# Patient Record
Sex: Male | Born: 1960 | Race: White | Hispanic: No | Marital: Married | Smoking: Current every day smoker
Health system: Southern US, Community
[De-identification: ages and names within clinical notes are randomized; demographics above are authoritative.]

## PROBLEM LIST (undated history)

## (undated) DIAGNOSIS — I10 Essential (primary) hypertension: Secondary | ICD-10-CM

## (undated) DIAGNOSIS — F32A Depression, unspecified: Secondary | ICD-10-CM

## (undated) DIAGNOSIS — M1990 Unspecified osteoarthritis, unspecified site: Secondary | ICD-10-CM

## (undated) DIAGNOSIS — I739 Peripheral vascular disease, unspecified: Secondary | ICD-10-CM

## (undated) DIAGNOSIS — R0600 Dyspnea, unspecified: Secondary | ICD-10-CM

## (undated) DIAGNOSIS — E785 Hyperlipidemia, unspecified: Secondary | ICD-10-CM

## (undated) DIAGNOSIS — E119 Type 2 diabetes mellitus without complications: Secondary | ICD-10-CM

## (undated) DIAGNOSIS — U071 COVID-19: Secondary | ICD-10-CM

## (undated) HISTORY — PX: CERVICAL FUSION: SHX112

---

## 2009-12-20 ENCOUNTER — Encounter: Admission: RE | Admit: 2009-12-20 | Discharge: 2009-12-20 | Payer: Self-pay | Admitting: Internal Medicine

## 2009-12-20 IMAGING — CT CT HEAD W/O CM
2 series · 16 of 30 positions shown, 20 images · non-contrast
Comparison: None.

CLINICAL DATA: Vision, query mass at the optic chiasm

CT HEAD WITHOUT CONTRAST
TECHNIQUE: Contiguous axial images were obtained from the base of
the skull through the vertex without contrast.

[Series 2: head wo · axial · 0.49mm/px · z∈[+192,+319]mm · 13 of 30 slices shown, 17 images]
[im 3/30  brain]
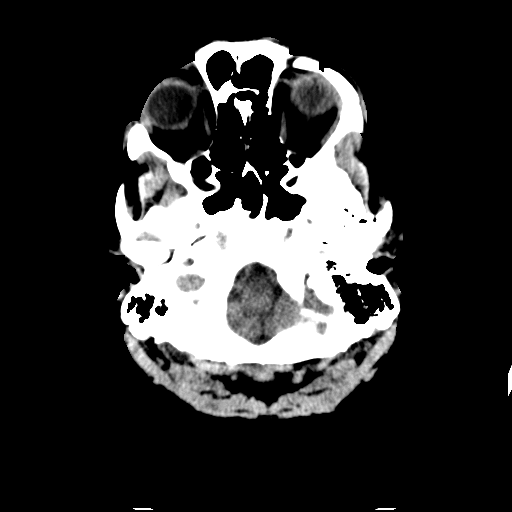
[im 3/30  bone]
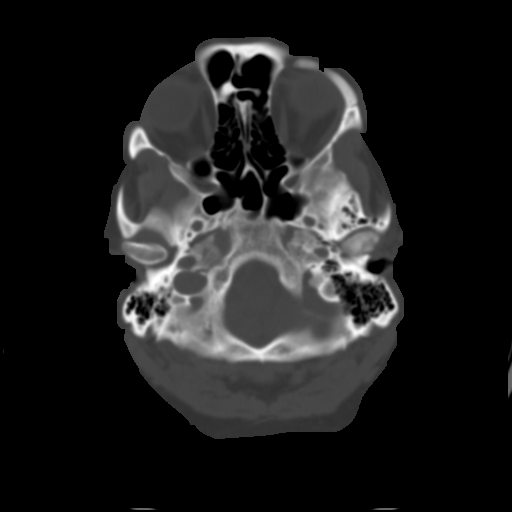
[im 5/30  brain]
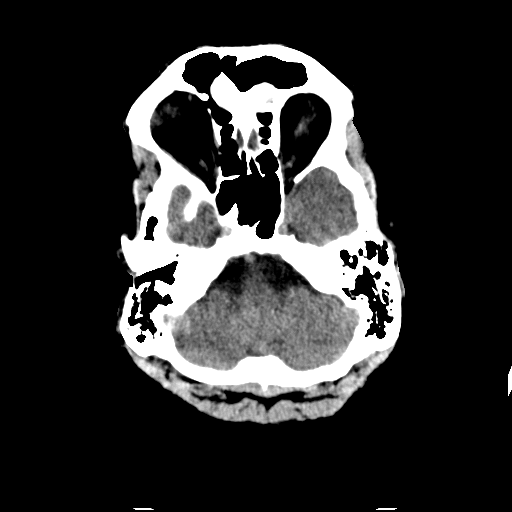
[im 7/30  brain]
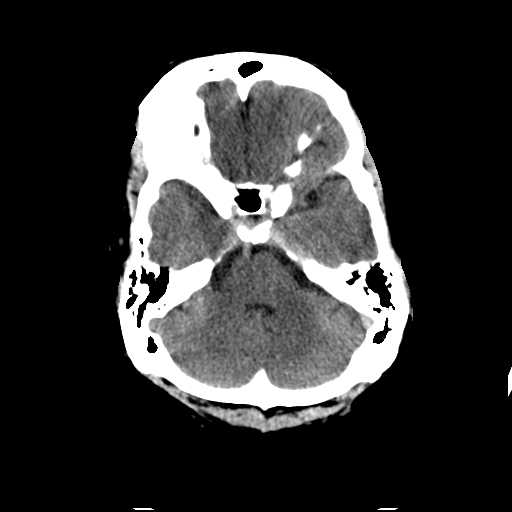
[im 9/30  brain]
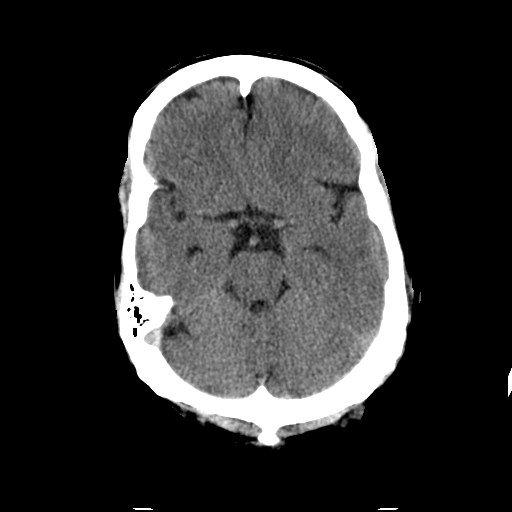
[im 11/30  brain]
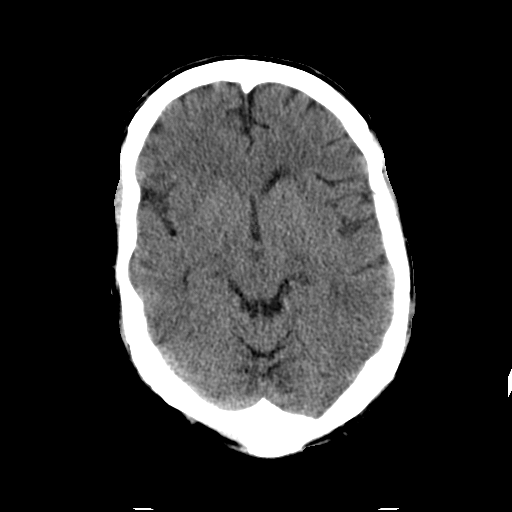
[im 11/30  bone]
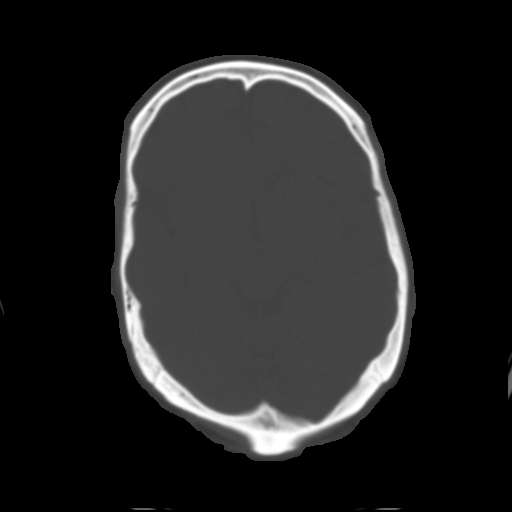
[im 13/30  brain]
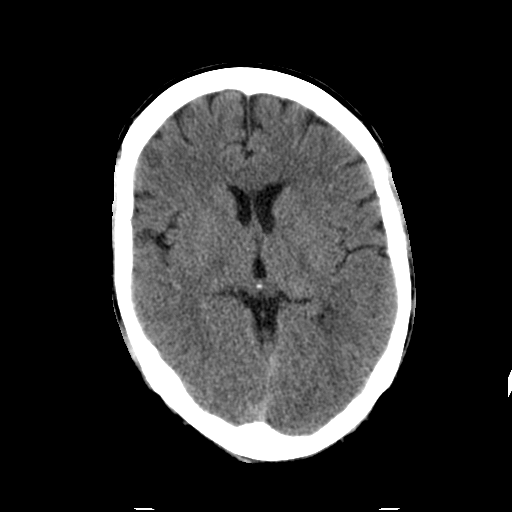
[im 15/30  brain]
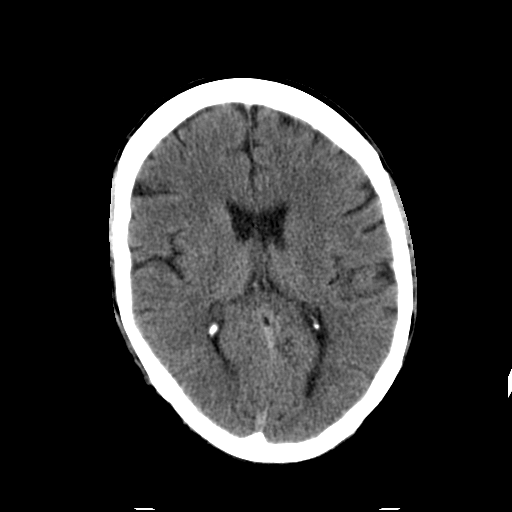
[im 17/30  brain]
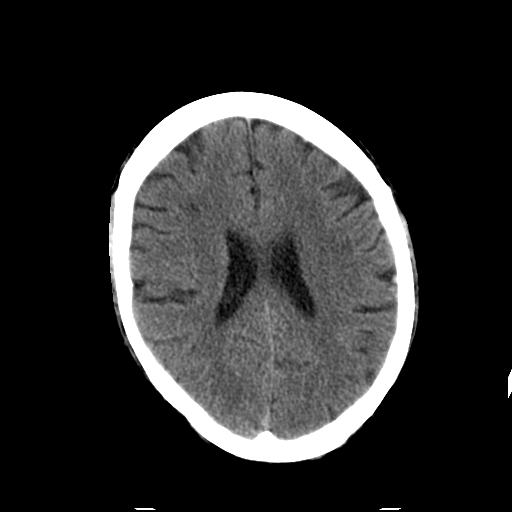
[im 19/30  brain]
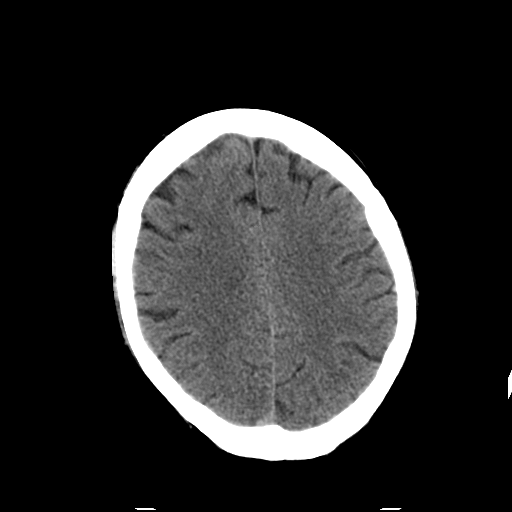
[im 19/30  bone]
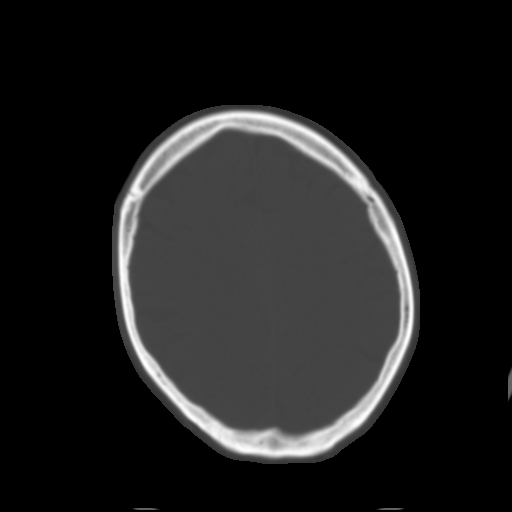
[im 21/30  brain]
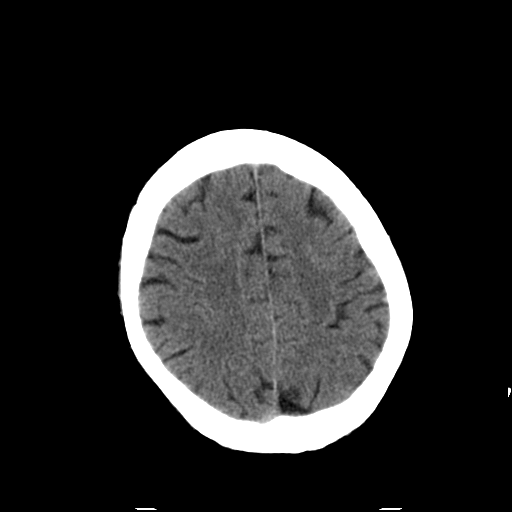
[im 23/30  brain]
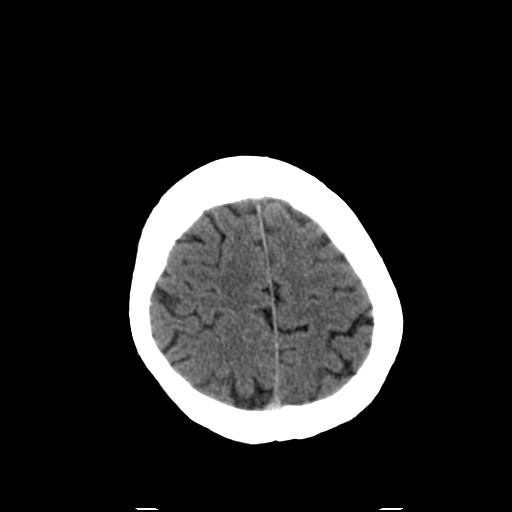
[im 25/30  brain]
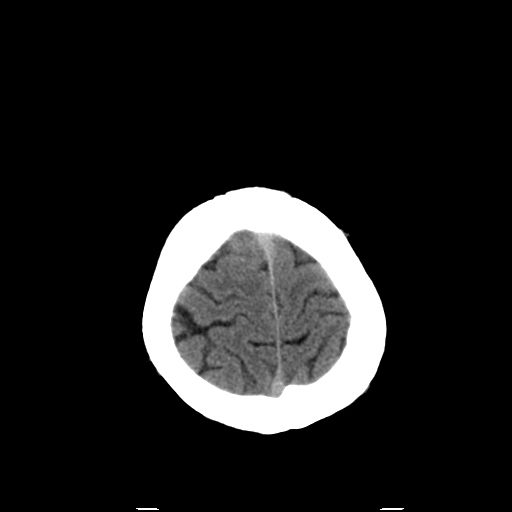
[im 27/30  brain]
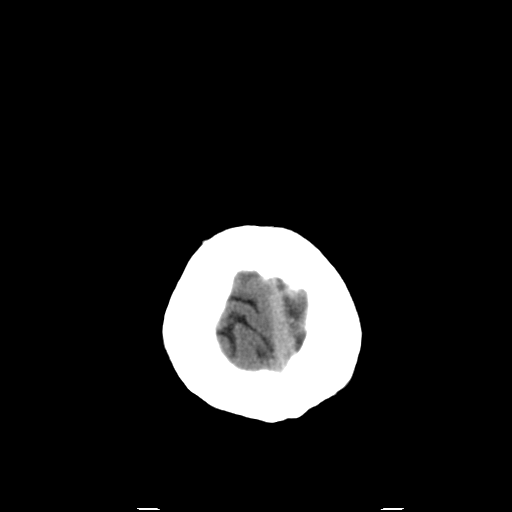
[im 27/30  bone]
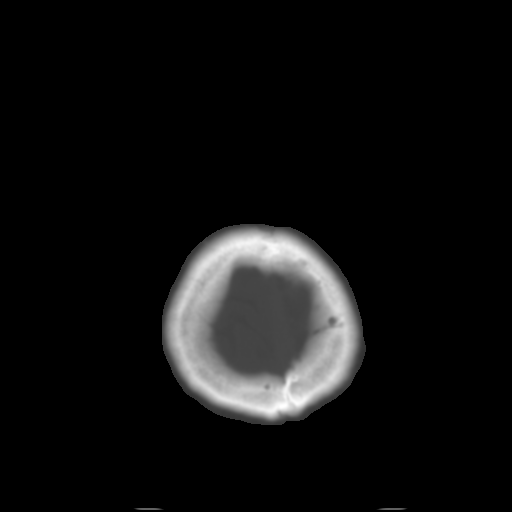

[Series 3: head bone · axial · 0.49mm/px · z∈[+192,+234]mm · 3 of 30 slices shown]
[im 3/30  bone]
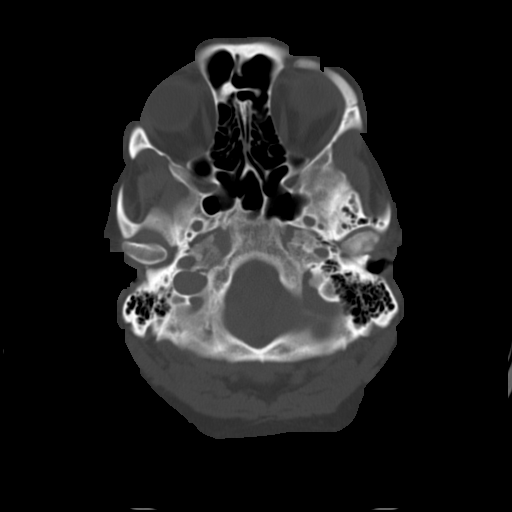
[im 7/30  bone]
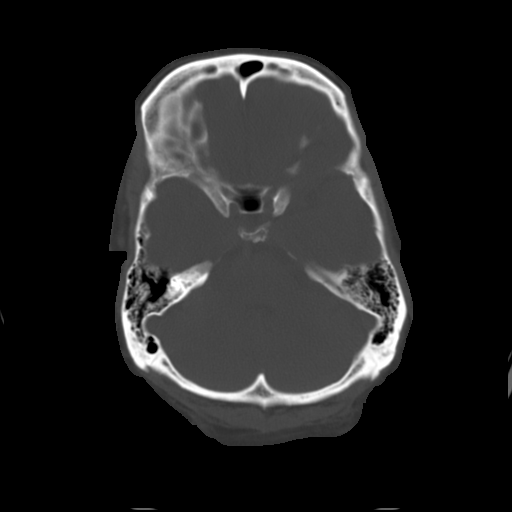
[im 11/30  bone]
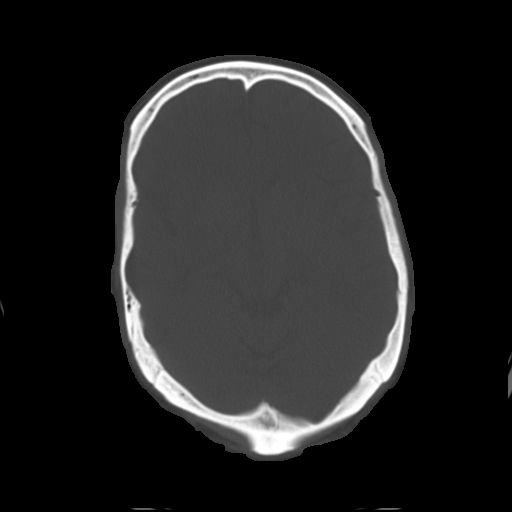

[16 of 30 positions shown; findings below may reference images not displayed]

FINDINGS: No evidence of intracranial hemorrhage.  No focal mass
lesion.  No CT evidence of acute infarction.  No midline shift or
mass effect.  No evidence of suprasellar mass or mass at the optic
chiasm within the limits of a noncontrast CT.  Orbits appear
normal.

Basilar cisterns are patent.

Paranasal sinuses and mastoid air cells are clear.  Orbits are
normal.
IMPRESSION: Normal head  for age and CT.

No evidence of mass in the suprasellar cistern or optic chiasm.  If
continued clinical concern recommend brain MRI.

## 2011-08-08 ENCOUNTER — Ambulatory Visit (INDEPENDENT_AMBULATORY_CARE_PROVIDER_SITE_OTHER): Payer: 59

## 2011-08-08 DIAGNOSIS — K122 Cellulitis and abscess of mouth: Secondary | ICD-10-CM

## 2011-08-08 DIAGNOSIS — I10 Essential (primary) hypertension: Secondary | ICD-10-CM

## 2011-08-08 DIAGNOSIS — E119 Type 2 diabetes mellitus without complications: Secondary | ICD-10-CM

## 2014-11-16 ENCOUNTER — Other Ambulatory Visit: Payer: Self-pay | Admitting: Neurosurgery

## 2014-11-23 ENCOUNTER — Inpatient Hospital Stay (HOSPITAL_COMMUNITY): Admission: RE | Admit: 2014-11-23 | Payer: Self-pay | Source: Ambulatory Visit

## 2014-11-24 ENCOUNTER — Inpatient Hospital Stay: Admit: 2014-11-24 | Payer: Self-pay | Admitting: Neurosurgery

## 2014-11-24 SURGERY — ANTERIOR CERVICAL DECOMPRESSION/DISCECTOMY FUSION 3 LEVELS
Anesthesia: General

## 2014-12-06 ENCOUNTER — Inpatient Hospital Stay (HOSPITAL_COMMUNITY)
Admission: EM | Admit: 2014-12-06 | Discharge: 2014-12-09 | DRG: 871 | Disposition: A | Discharge status: Home / Self Care | Payer: 59 | Attending: Internal Medicine | Admitting: Internal Medicine

## 2014-12-06 ENCOUNTER — Encounter (HOSPITAL_COMMUNITY): Payer: Self-pay | Admitting: Emergency Medicine

## 2014-12-06 DIAGNOSIS — F39 Unspecified mood [affective] disorder: Secondary | ICD-10-CM | POA: Diagnosis present

## 2014-12-06 DIAGNOSIS — A419 Sepsis, unspecified organism: Principal | ICD-10-CM | POA: Diagnosis present

## 2014-12-06 DIAGNOSIS — E119 Type 2 diabetes mellitus without complications: Secondary | ICD-10-CM | POA: Diagnosis present

## 2014-12-06 DIAGNOSIS — G9340 Encephalopathy, unspecified: Secondary | ICD-10-CM

## 2014-12-06 DIAGNOSIS — R221 Localized swelling, mass and lump, neck: Secondary | ICD-10-CM

## 2014-12-06 DIAGNOSIS — F17210 Nicotine dependence, cigarettes, uncomplicated: Secondary | ICD-10-CM | POA: Diagnosis present

## 2014-12-06 DIAGNOSIS — R410 Disorientation, unspecified: Secondary | ICD-10-CM

## 2014-12-06 DIAGNOSIS — J390 Retropharyngeal and parapharyngeal abscess: Secondary | ICD-10-CM | POA: Diagnosis present

## 2014-12-06 DIAGNOSIS — Z981 Arthrodesis status: Secondary | ICD-10-CM

## 2014-12-06 DIAGNOSIS — E1165 Type 2 diabetes mellitus with hyperglycemia: Secondary | ICD-10-CM

## 2014-12-06 DIAGNOSIS — R509 Fever, unspecified: Secondary | ICD-10-CM | POA: Insufficient documentation

## 2014-12-06 DIAGNOSIS — I10 Essential (primary) hypertension: Secondary | ICD-10-CM | POA: Diagnosis present

## 2014-12-06 DIAGNOSIS — M4322 Fusion of spine, cervical region: Secondary | ICD-10-CM | POA: Diagnosis present

## 2014-12-06 DIAGNOSIS — Z79891 Long term (current) use of opiate analgesic: Secondary | ICD-10-CM

## 2014-12-06 DIAGNOSIS — T8140XA Infection following a procedure, unspecified, initial encounter: Secondary | ICD-10-CM

## 2014-12-06 DIAGNOSIS — Z79899 Other long term (current) drug therapy: Secondary | ICD-10-CM

## 2014-12-06 DIAGNOSIS — R443 Hallucinations, unspecified: Secondary | ICD-10-CM | POA: Diagnosis present

## 2014-12-06 HISTORY — DX: Type 2 diabetes mellitus without complications: E11.9

## 2014-12-06 HISTORY — DX: Essential (primary) hypertension: I10

## 2014-12-06 LAB — CBC WITH DIFFERENTIAL/PLATELET
BASOS ABS: 0 10*3/uL (ref 0.0–0.1)
BASOS PCT: 0 % (ref 0–1)
EOS ABS: 0.3 10*3/uL (ref 0.0–0.7)
EOS PCT: 2 % (ref 0–5)
HEMATOCRIT: 45.8 % (ref 39.0–52.0)
HEMOGLOBIN: 15.8 g/dL (ref 13.0–17.0)
LYMPHS PCT: 18 % (ref 12–46)
Lymphs Abs: 2.4 10*3/uL (ref 0.7–4.0)
MCH: 32.8 pg (ref 26.0–34.0)
MCHC: 34.5 g/dL (ref 30.0–36.0)
MCV: 95 fL (ref 78.0–100.0)
Monocytes Absolute: 1 10*3/uL (ref 0.1–1.0)
Monocytes Relative: 8 % (ref 3–12)
NEUTROS ABS: 9.4 10*3/uL — AB (ref 1.7–7.7)
NEUTROS PCT: 72 % (ref 43–77)
Platelets: 168 10*3/uL (ref 150–400)
RBC: 4.82 MIL/uL (ref 4.22–5.81)
RDW: 13.1 % (ref 11.5–15.5)
WBC: 13.1 10*3/uL — ABNORMAL HIGH (ref 4.0–10.5)

## 2014-12-06 LAB — I-STAT CHEM 8, ED
BUN: 15 mg/dL (ref 6–23)
CALCIUM ION: 1.11 mmol/L — AB (ref 1.12–1.23)
CHLORIDE: 92 mmol/L — AB (ref 96–112)
CREATININE: 1.1 mg/dL (ref 0.50–1.35)
Glucose, Bld: 222 mg/dL — ABNORMAL HIGH (ref 70–99)
HCT: 50 % (ref 39.0–52.0)
Hemoglobin: 17 g/dL (ref 13.0–17.0)
POTASSIUM: 4.5 mmol/L (ref 3.5–5.1)
SODIUM: 132 mmol/L — AB (ref 135–145)
TCO2: 29 mmol/L (ref 0–100)

## 2014-12-06 LAB — CBG MONITORING, ED: Glucose-Capillary: 218 mg/dL — ABNORMAL HIGH (ref 70–99)

## 2014-12-06 NOTE — ED Notes (Signed)
[id number]. Notified RN

## 2014-12-06 NOTE — ED Notes (Addendum)
Pt has a round mass about 2 inches in diameter on the left side of his neck. Pts daughter states the mass was not initially there after the surgery. The mass is not pulsating. No drainage noted.   Pts daughter states that the pt sounds more muffled than he normally does.

## 2014-12-06 NOTE — ED Notes (Signed)
Pt had surgery on April 13th.

## 2014-12-06 NOTE — ED Notes (Addendum)
Pt talking to daughter, saying "I need to talk to the doctor that worked on me last night", daughter states "you didn't see a doctor yesterday", daughter states that the pt has been saying things like this and has been confused.   PTs daughter states that the pt has been sleep walking

## 2014-12-06 NOTE — ED Provider Notes (Signed)
CSN: [id number]     Arrival date & time 12/06/14  2207 History  This chart was scribed for Azalia BilisKevin Terrence Pizana, MD by Annye AsaAnna Dorsett, ED Scribe. This patient was seen in room B17C/B17C and the patient's care was started at 11:55 PM.    Chief Complaint  Patient presents with  . Post-op Problem   The history is provided by the patient. No language interpreter was used.     HPI Comments: Cody Pena is a 54 y.o. male with past medical history of DM, HTN who presents to the Emergency Department complaining of 1 month of generalized myalgias and arthralgias. He also reports cough, with associated "pressure"-like discomfort in his neck and shoulders. Patient's daughter reports a new onset swelling to the left side of the patient's neck; she notes a new "muffled" sound to patient's voice. She also reports several days of abnormal behavior; confusion, difficulty holding objects, hallucinations.   Patient had a C3-C7 anterior fusion surgery 12 days PTA (11/24/14 Dr. Jeral FruitBotero). PCP Dr. Clarene DukeLittle in [place], [place].   Past Medical History  Diagnosis Date  . Diabetes mellitus without complication   . Hypertension    Past Surgical History  Procedure Laterality Date  . Cervical fusion     No family history on file. History  Substance Use Topics  . Smoking status: Current Every Day Smoker  . Smokeless tobacco: Not on file  . Alcohol Use: No    Review of Systems  A complete 10 system review of systems was obtained and all systems are negative except as noted in the HPI and PMH.    Allergies  Review of patient's allergies indicates no known allergies.  Home Medications   Prior to Admission medications   Not on File   BP 136/90 mmHg  Pulse 103  Temp(Src) 100.1 F (37.8 C) (Oral)  Resp 20  Ht 6\' 3"  (1.905 m)  Wt 250 lb (113.399 kg)  BMI 31.25 kg/m2  SpO2 90% Physical Exam  Constitutional: He is oriented to person, place, and time. He appears well-developed and well-nourished.  HENT:  Head:  Normocephalic and atraumatic.  Eyes: EOM are normal.  Neck: Normal range of motion.  Anterior cervical fusion without erythema but fluctuant mass just under the incision. No drainage at incision site. No stridor, no crepitus.   Cardiovascular: Normal rate, regular rhythm, normal heart sounds and intact distal pulses.   Pulmonary/Chest: Effort normal and breath sounds normal. No respiratory distress.  Abdominal: Soft. He exhibits no distension. There is no tenderness.  Musculoskeletal: Normal range of motion.  Neurological: He is alert and oriented to person, place, and time.  Skin: Skin is warm and dry.  Psychiatric: He has a normal mood and affect. Judgment normal.  Nursing note and vitals reviewed.   ED Course  Procedures   DIAGNOSTIC STUDIES: Oxygen Saturation is 91% on RA, low by my interpretation.    COORDINATION OF CARE: 12:03 AM Discussed treatment plan with pt at bedside and pt agreed to plan.  3:11 AM Phone consult with Dr. Lovell SheehanJenkins, neurosurgery, who will see patient in the morning. Admit to medicine service.   Labs Review Labs Reviewed  CBC WITH DIFFERENTIAL/PLATELET - Abnormal; Notable for the following:    WBC 13.1 (*)    Neutro Abs 9.4 (*)    All other components within normal limits  CBG MONITORING, ED - Abnormal; Notable for the following:    Glucose-Capillary 218 (*)    All other components within normal limits  I-STAT CHEM  8, ED - Abnormal; Notable for the following:    Sodium 132 (*)    Chloride 92 (*)    Glucose, Bld 222 (*)    Calcium, Ion 1.11 (*)    All other components within normal limits  I-STAT CG4 LACTIC ACID, ED    Imaging Review Dg Chest 2 View  12/07/2014   CLINICAL DATA:  54 year old male with cough, fever and altered mental status with hallucinations  EXAM: CHEST  2 VIEW  COMPARISON:  Prior chest x-ray 09/21/2013  FINDINGS: Stable cardiac and mediastinal contours. Low inspiratory volumes which results in progressed appearance of central  airway thickening and interstitial prominence. No focal airspace consolidation, pleural effusion or pneumothorax. The lateral view appears to be an expiration. Incompletely imaged anterior cervical fusion hardware. No acute osseous abnormality.  IMPRESSION: Low inspiratory volumes. Otherwise, no acute cardiopulmonary process.   Electronically Signed   By: Malachy Moan M.D.   On: 12/07/2014 01:25   Ct Head Wo Contrast  12/07/2014   CLINICAL DATA:  Acute confusion and altered mental status. Hallucinations.  EXAM: CT HEAD WITHOUT CONTRAST  TECHNIQUE: Contiguous axial images were obtained from the base of the skull through the vertex without intravenous contrast.  COMPARISON:  12/07/2014  FINDINGS: Mild chronic small-vessel white matter ischemic changes are again noted.  No acute intracranial abnormalities are identified, including mass lesion or mass effect, hydrocephalus, extra-axial fluid collection, midline shift, hemorrhage, or acute infarction.  The visualized bony calvarium is unremarkable.  IMPRESSION: No evidence of acute intracranial abnormality.  Mild chronic small-vessel white matter ischemic changes.   Electronically Signed   By: Harmon Pier M.D.   On: 12/07/2014 04:32   Ct Soft Tissue Neck W Contrast  12/07/2014   CLINICAL DATA:  Anterior fusion performed five days ago, now with fever, and neck pain. Assess for infection.  EXAM: CT NECK WITH CONTRAST  TECHNIQUE: Multidetector CT imaging of the neck was performed using the standard protocol following the bolus administration of intravenous contrast.  CONTRAST:  75mL OMNIPAQUE IOHEXOL 300 MG/ML  SOLN  COMPARISON:  MRI of the cervical spine July 17, 2014  FINDINGS: Pharynx and larynx: Mass effect from retropharyngeal/prevertebral fluid collection as described below, though widely patent and otherwise unremarkable.  Salivary glands: Normal.  Thyroid: Normal.  Lymph nodes: Normal.  Vascular: Normal.  Limited intracranial: Normal.  Visualized  orbits: Normal.  Mastoids and visualized paranasal sinuses: Well aerated. Multiple mandible dental caries and periapical lucencies. Absent maxillary teeth.  Skeleton/soft tissues: Interval C3-4 thru C6-7 ACDF, intact plate, which is not apposed to the anterior C3 or C4 vertebral bodies by 4 mm. No acute fracture. Straightened cervical lordosis. Multilevel at least moderate neural foraminal narrowing incompletely imaged. Prevertebral/ retropharyngeal effusion with focal ovoid 16 x 7 mm fluid collection within LEFT longus coli muscle, contiguous with a crescentic 5.9 x 2.4 cm fluid collection within LEFT neck, wrapping posterior to the laryngeal cartilage, extending anteriorly within 1 cm of the skin surface. Evaluation of the fluid collection is somewhat limited by streak artifact from hardware.  Upper chest: Patchy ground-glass opacities partially imaged in RIGHT lung apex.  IMPRESSION: Recent C3-4 through C6-7 ACDF, upper aspect of the plate is not apposed to the anterior vertebral bodies. Superimposed LEFT longus coli /LEFT neck fluid collection measuring up to 5.9 x 2.4 cm, which could reflect seroma, hematoma or possible early abscess, limited assessment due to streak artifact from hardware.  Partially imaged ground-glass opacities RIGHT lung apex may be infectious or  inflammatory.   Electronically Signed   By: Awilda Metro   On: 12/07/2014 02:39  I personally reviewed the imaging tests through PACS system I reviewed available ER/hospitalization records through the EMR     EKG Interpretation None      MDM   Final diagnoses:  Fever, unspecified fever cause  Delirium  Neck swelling   Patient with low-grade fever and new delirium.  Blood cultures obtained.  Started on antibiotics.  Vital signs her lactate are normal.  Patient does have a increasing left neck mass status post anterior cervical fusion 12 days ago by Dr. Jeral Fruit.  Fluid collection is likely developing abscess.  I discussed the  case with Dr. Lovell Sheehan of neurosurgery who agrees to see the patient in consultation the morning.  Agrees to admission to the hospitalist service.  Spoke with Dr. Allena Katz with [facility]. I personally performed the services described in this documentation, which was scribed in my presence. The recorded information has been reviewed and is accurate.        Azalia Bilis, MD 12/07/14 [phone]

## 2014-12-06 NOTE — ED Notes (Signed)
PT states that earlier in the day he was "seeing elephants and crazy stuff like that", pt denies hallucinations at this. Pt denies ever having auditory hallucinations.   Pts daughter states that the pt has also "dropped like 3 cups of coffee, and that's not like him. He also hasn't been sleeping".

## 2014-12-06 NOTE — ED Notes (Signed)
Pt st's he had neck surg on 4/13  Daughter st's pt has not been acting right.  Dropping things, hallucinating   Pt has lg knot at incision site.  Also pt has been taking oxycodone with his other meds.  Pt alert and oriented at this time

## 2014-12-07 ENCOUNTER — Encounter (HOSPITAL_COMMUNITY): Payer: Self-pay

## 2014-12-07 ENCOUNTER — Inpatient Hospital Stay (HOSPITAL_COMMUNITY): Payer: 59

## 2014-12-07 ENCOUNTER — Emergency Department (HOSPITAL_COMMUNITY): Payer: 59

## 2014-12-07 DIAGNOSIS — J390 Retropharyngeal and parapharyngeal abscess: Secondary | ICD-10-CM | POA: Diagnosis present

## 2014-12-07 DIAGNOSIS — F39 Unspecified mood [affective] disorder: Secondary | ICD-10-CM | POA: Diagnosis present

## 2014-12-07 DIAGNOSIS — M4322 Fusion of spine, cervical region: Secondary | ICD-10-CM | POA: Diagnosis present

## 2014-12-07 DIAGNOSIS — A419 Sepsis, unspecified organism: Secondary | ICD-10-CM | POA: Diagnosis present

## 2014-12-07 DIAGNOSIS — R509 Fever, unspecified: Secondary | ICD-10-CM

## 2014-12-07 DIAGNOSIS — R410 Disorientation, unspecified: Secondary | ICD-10-CM | POA: Diagnosis not present

## 2014-12-07 DIAGNOSIS — E119 Type 2 diabetes mellitus without complications: Secondary | ICD-10-CM | POA: Diagnosis present

## 2014-12-07 DIAGNOSIS — R443 Hallucinations, unspecified: Secondary | ICD-10-CM | POA: Diagnosis present

## 2014-12-07 DIAGNOSIS — Z79891 Long term (current) use of opiate analgesic: Secondary | ICD-10-CM | POA: Diagnosis not present

## 2014-12-07 DIAGNOSIS — I10 Essential (primary) hypertension: Secondary | ICD-10-CM | POA: Diagnosis present

## 2014-12-07 DIAGNOSIS — R221 Localized swelling, mass and lump, neck: Secondary | ICD-10-CM | POA: Insufficient documentation

## 2014-12-07 DIAGNOSIS — E118 Type 2 diabetes mellitus with unspecified complications: Secondary | ICD-10-CM

## 2014-12-07 DIAGNOSIS — F17210 Nicotine dependence, cigarettes, uncomplicated: Secondary | ICD-10-CM | POA: Diagnosis present

## 2014-12-07 DIAGNOSIS — Z981 Arthrodesis status: Secondary | ICD-10-CM | POA: Diagnosis not present

## 2014-12-07 DIAGNOSIS — E1165 Type 2 diabetes mellitus with hyperglycemia: Secondary | ICD-10-CM

## 2014-12-07 DIAGNOSIS — Z79899 Other long term (current) drug therapy: Secondary | ICD-10-CM | POA: Diagnosis not present

## 2014-12-07 DIAGNOSIS — G9340 Encephalopathy, unspecified: Secondary | ICD-10-CM | POA: Diagnosis not present

## 2014-12-07 DIAGNOSIS — T814XXA Infection following a procedure, initial encounter: Secondary | ICD-10-CM | POA: Diagnosis not present

## 2014-12-07 LAB — URINALYSIS, ROUTINE W REFLEX MICROSCOPIC
BILIRUBIN URINE: NEGATIVE
Glucose, UA: 500 mg/dL — AB
HGB URINE DIPSTICK: NEGATIVE
KETONES UR: NEGATIVE mg/dL
LEUKOCYTES UA: NEGATIVE
Nitrite: NEGATIVE
PROTEIN: NEGATIVE mg/dL
SPECIFIC GRAVITY, URINE: 1.023 (ref 1.005–1.030)
Urobilinogen, UA: 1 mg/dL (ref 0.0–1.0)
pH: 5.5 (ref 5.0–8.0)

## 2014-12-07 LAB — BASIC METABOLIC PANEL
Anion gap: 9 (ref 5–15)
BUN: 9 mg/dL (ref 6–23)
CALCIUM: 8.5 mg/dL (ref 8.4–10.5)
CO2: 27 mmol/L (ref 19–32)
Chloride: 99 mmol/L (ref 96–112)
Creatinine, Ser: 0.97 mg/dL (ref 0.50–1.35)
GFR calc Af Amer: >90 mL/min (ref >90)
Glucose, Bld: 199 mg/dL — ABNORMAL HIGH (ref 70–99)
Potassium: 4.2 mmol/L (ref 3.5–5.1)
Sodium: 135 mmol/L (ref 135–145)

## 2014-12-07 LAB — CBC WITH DIFFERENTIAL/PLATELET
BASOS PCT: 0 % (ref 0–1)
Basophils Absolute: 0 10*3/uL (ref 0.0–0.1)
EOS ABS: 0.2 10*3/uL (ref 0.0–0.7)
EOS PCT: 2 % (ref 0–5)
HEMATOCRIT: 45.9 % (ref 39.0–52.0)
Hemoglobin: 15.7 g/dL (ref 13.0–17.0)
LYMPHS ABS: 2 10*3/uL (ref 0.7–4.0)
LYMPHS PCT: 16 % (ref 12–46)
MCH: 32.6 pg (ref 26.0–34.0)
MCHC: 34.2 g/dL (ref 30.0–36.0)
MCV: 95.4 fL (ref 78.0–100.0)
Monocytes Absolute: 1.2 10*3/uL — ABNORMAL HIGH (ref 0.1–1.0)
Monocytes Relative: 9 % (ref 3–12)
NEUTROS ABS: 8.9 10*3/uL — AB (ref 1.7–7.7)
Neutrophils Relative %: 73 % (ref 43–77)
PLATELETS: 164 10*3/uL (ref 150–400)
RBC: 4.81 MIL/uL (ref 4.22–5.81)
RDW: 13.1 % (ref 11.5–15.5)
WBC: 12.3 10*3/uL — ABNORMAL HIGH (ref 4.0–10.5)

## 2014-12-07 LAB — FOLATE: FOLATE: 8.6 ng/mL

## 2014-12-07 LAB — I-STAT ARTERIAL BLOOD GAS, ED
Acid-Base Excess: 3 mmol/L — ABNORMAL HIGH (ref 0.0–2.0)
Bicarbonate: 27.5 mEq/L — ABNORMAL HIGH (ref 20.0–24.0)
O2 Saturation: 89 %
PCO2 ART: 42.4 mmHg (ref 35.0–45.0)
PH ART: 7.421 (ref 7.350–7.450)
TCO2: 29 mmol/L (ref 0–100)
pO2, Arterial: 55 mmHg — ABNORMAL LOW (ref 80.0–100.0)

## 2014-12-07 LAB — VITAMIN B12: Vitamin B-12: 650 pg/mL (ref 211–911)

## 2014-12-07 LAB — GLUCOSE, CAPILLARY
GLUCOSE-CAPILLARY: 172 mg/dL — AB (ref 70–99)
Glucose-Capillary: 179 mg/dL — ABNORMAL HIGH (ref 70–99)
Glucose-Capillary: 196 mg/dL — ABNORMAL HIGH (ref 70–99)

## 2014-12-07 LAB — SEDIMENTATION RATE: Sed Rate: 20 mm/hr — ABNORMAL HIGH (ref 0–16)

## 2014-12-07 LAB — TSH: TSH: 1.184 u[IU]/mL (ref 0.350–4.500)

## 2014-12-07 LAB — RAPID URINE DRUG SCREEN, HOSP PERFORMED
Amphetamines: NOT DETECTED
BARBITURATES: NOT DETECTED
Benzodiazepines: NOT DETECTED
Cocaine: NOT DETECTED
Opiates: NOT DETECTED
TETRAHYDROCANNABINOL: NOT DETECTED

## 2014-12-07 LAB — I-STAT CG4 LACTIC ACID, ED: Lactic Acid, Venous: 1.75 mmol/L (ref 0.5–2.0)

## 2014-12-07 LAB — MAGNESIUM: Magnesium: 2 mg/dL (ref 1.5–2.5)

## 2014-12-07 LAB — C-REACTIVE PROTEIN: CRP: 7 mg/dL — ABNORMAL HIGH (ref <0.60)

## 2014-12-07 LAB — ACETAMINOPHEN LEVEL

## 2014-12-07 LAB — ETHANOL

## 2014-12-07 IMAGING — CR DG CHEST 2V
2 series · 2 of 2 positions shown · non-contrast
Comparison: Prior chest x-ray [DATE]

CLINICAL DATA: 53-year-old male with cough, fever and altered
mental status with hallucinations

EXAM:
CHEST  2 VIEW

[x chest ap]
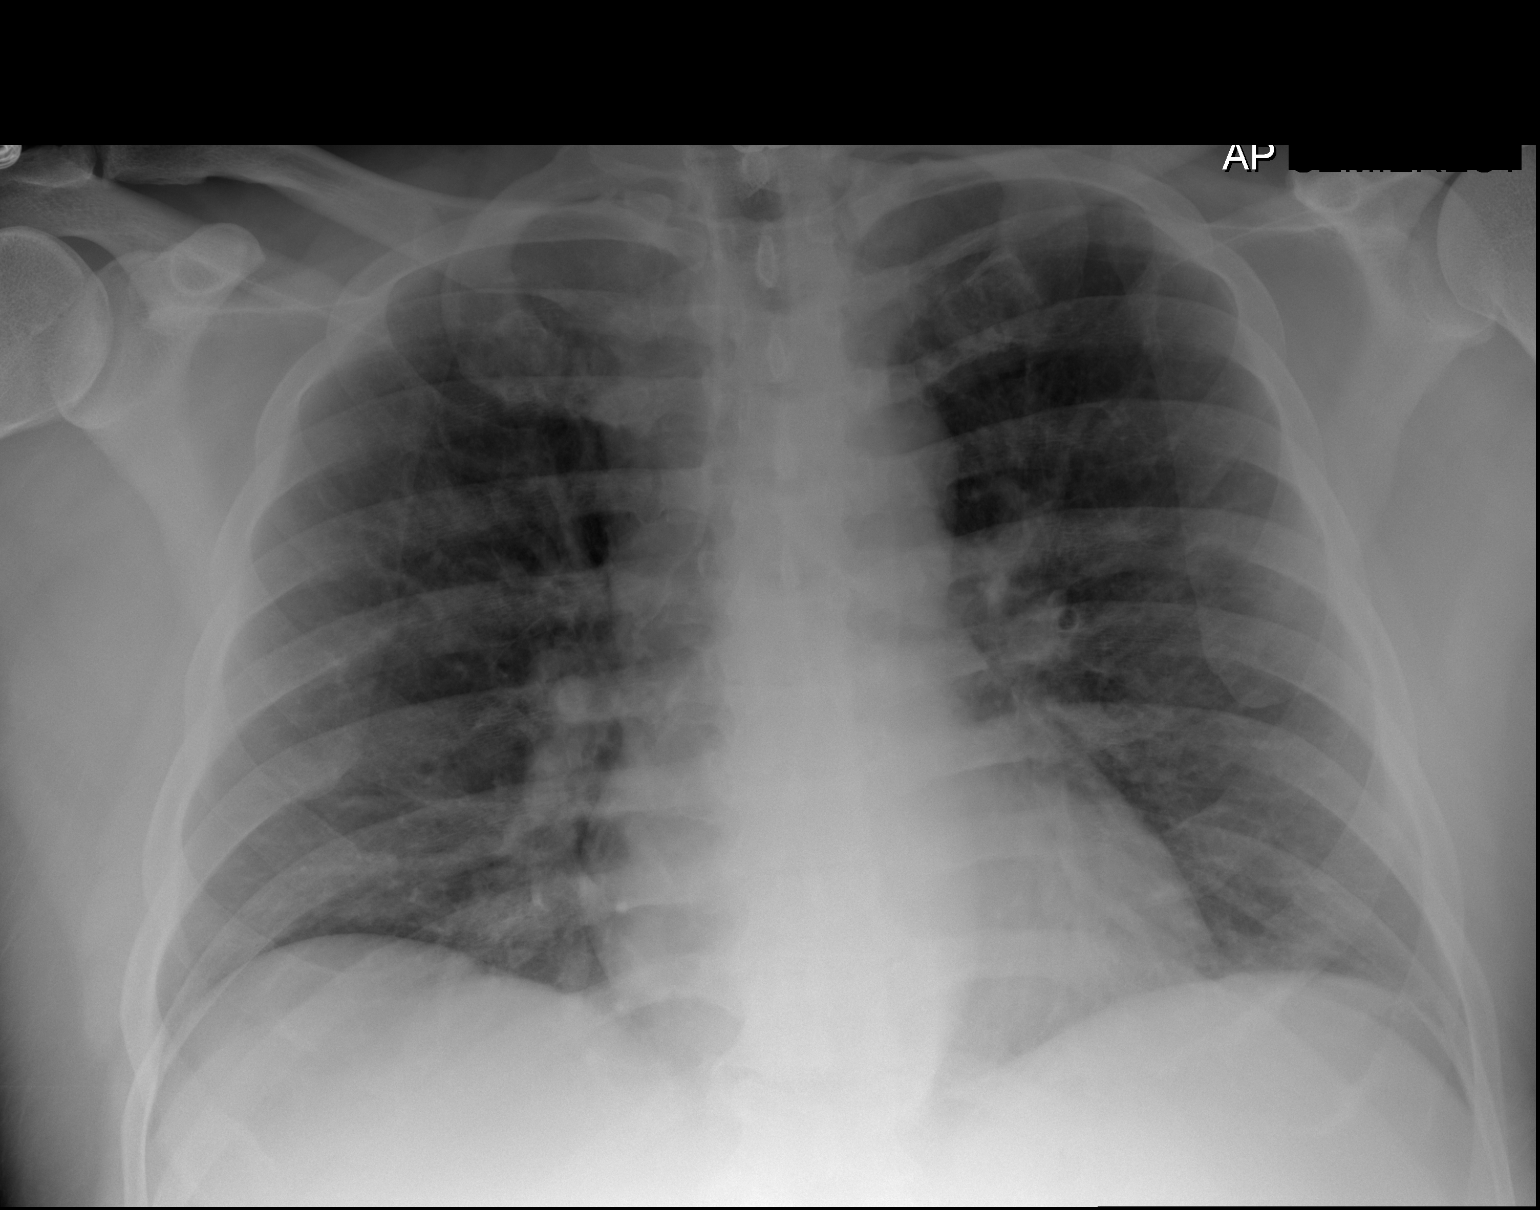

[w chest lat]
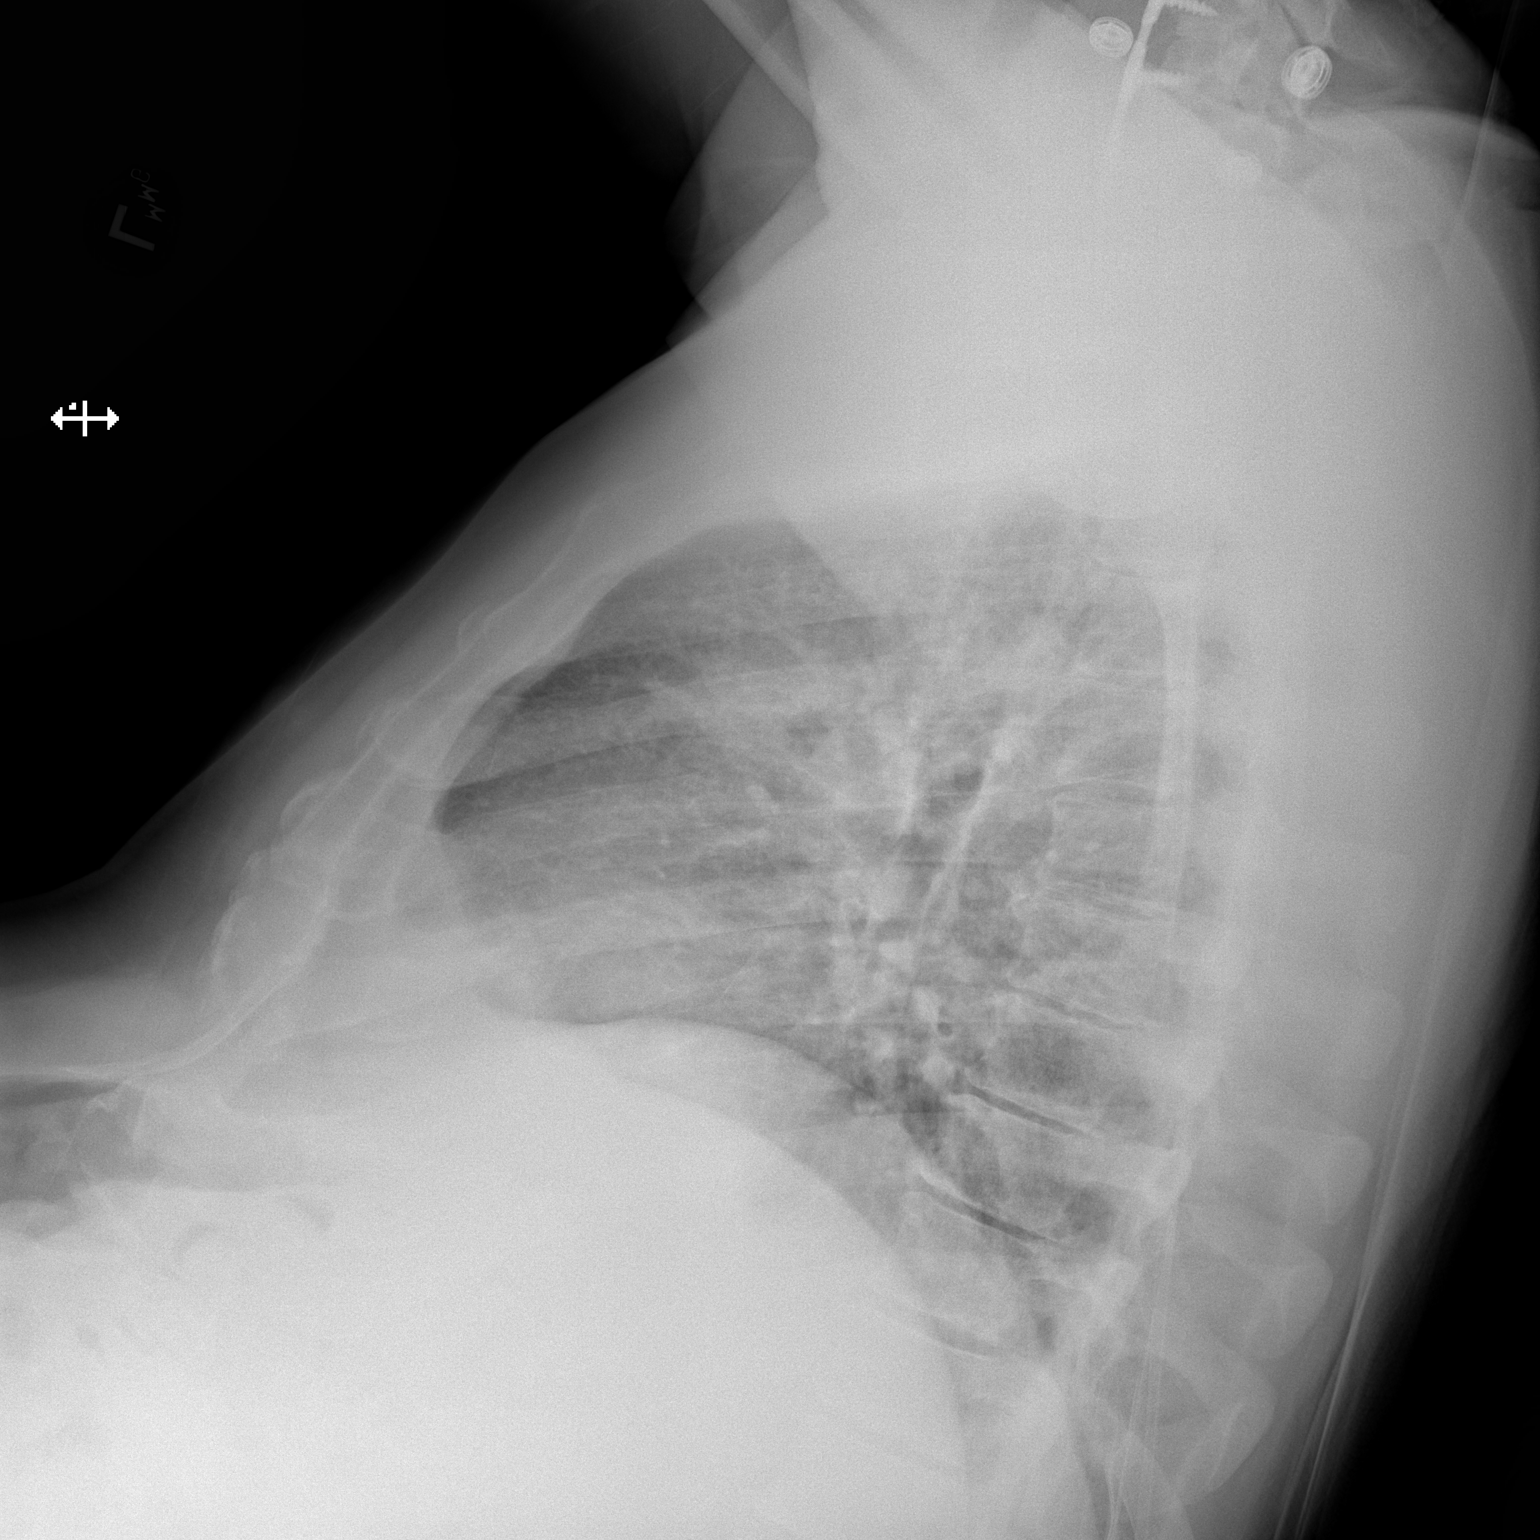

[2 of 2 positions shown; findings below may reference images not displayed]

FINDINGS: Stable cardiac and mediastinal contours. Low inspiratory volumes
which results in progressed appearance of central airway thickening
and interstitial prominence. No focal airspace consolidation,
pleural effusion or pneumothorax. The lateral view appears to be an
expiration. Incompletely imaged anterior cervical fusion hardware.
No acute osseous abnormality.
IMPRESSION: Low inspiratory volumes. Otherwise, no acute cardiopulmonary
process.

## 2014-12-07 IMAGING — CT CT NECK W/ CM
4 of 5 series · 15 of 33 positions shown, 17 images · IV contrast (Omni 300)
Comparison: MRI of the cervical spine [DATE]

CLINICAL DATA: Anterior fusion performed five days ago, now with
fever, and neck pain. Assess for infection.

EXAM:
CT NECK WITH CONTRAST
TECHNIQUE: Multidetector CT imaging of the neck was performed using the
standard protocol following the bolus administration of intravenous
contrast.
CONTRAST:  75mL OMNIPAQUE IOHEXOL 300 MG/ML  SOLN

[Series 2: neck 2.0 i31s 3 · axial · 0.51mm/px · z∈[-249,-105]mm · 4 of 121 slices shown, 5 images]
[im 25/121  soft-tissue]
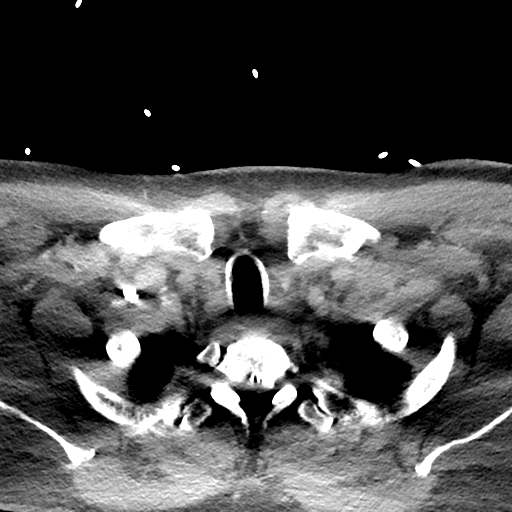
[im 25/121  bone]
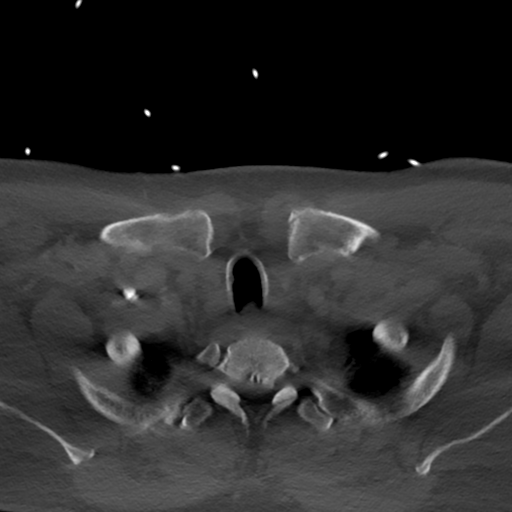
[im 49/121  bone]
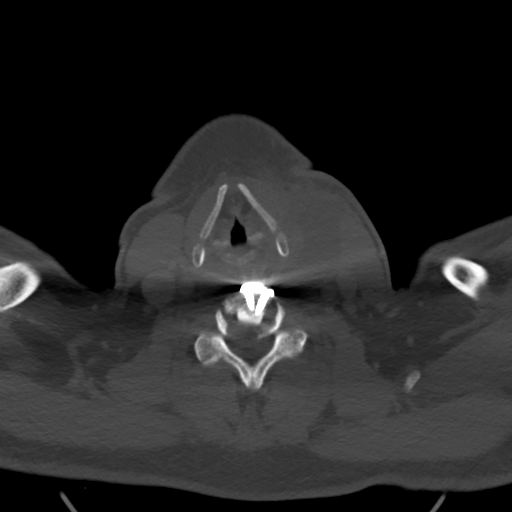
[im 73/121  bone]
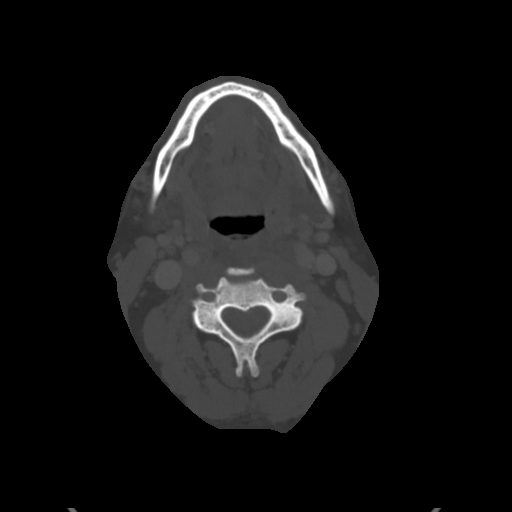
[im 97/121  bone]
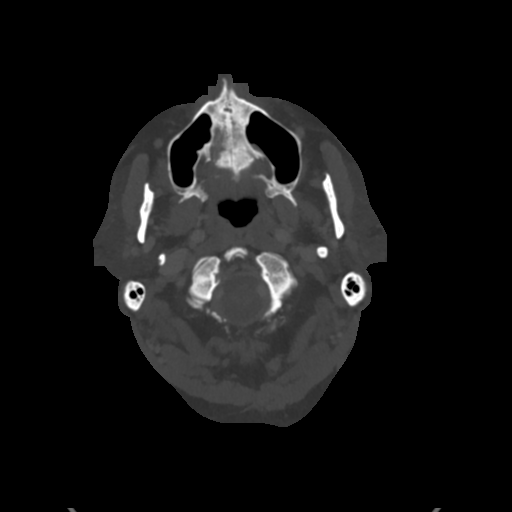

[Series 5: coronal st · coronal · 0.44mm/px · 3 of 121 slices shown]
[im 25/121  bone]
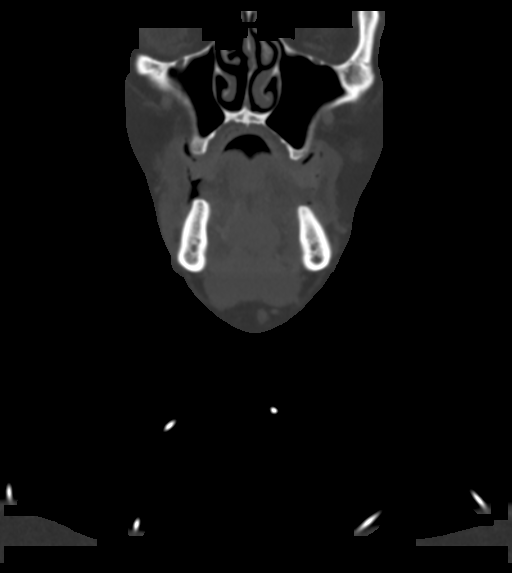
[im 49/121  bone]
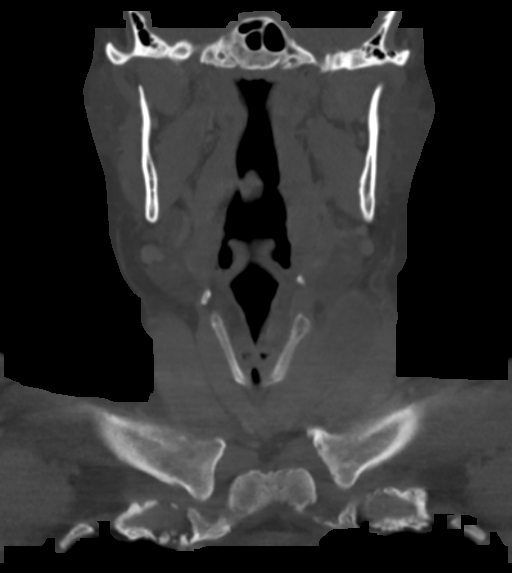
[im 73/121  bone]
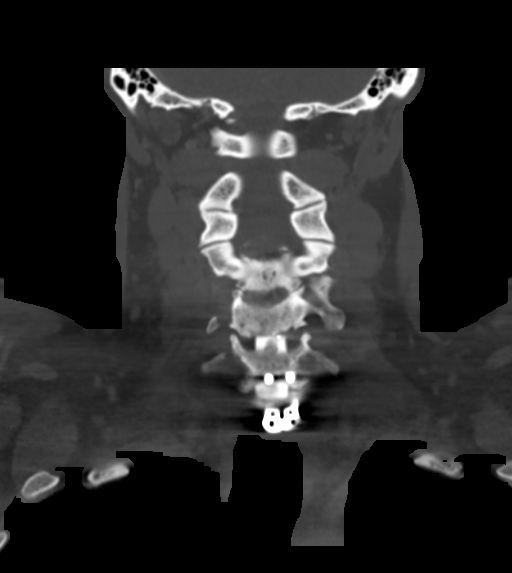

[Series 6: sagittal st · sagittal · 0.49mm/px · 5 of 93 slices shown, 6 images]
[im 31/93  bone]
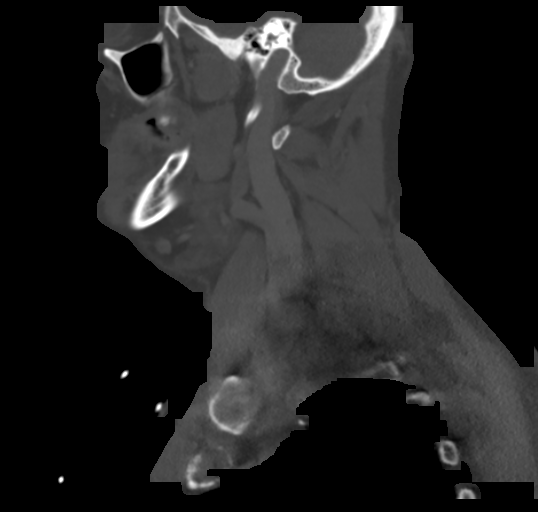
[im 39/93  bone]
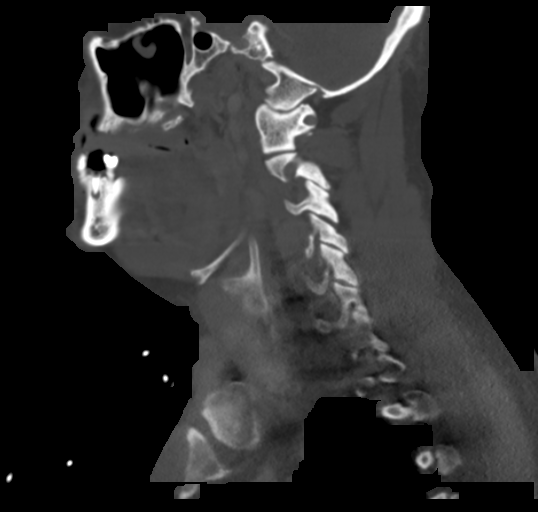
[im 47/93  soft-tissue]
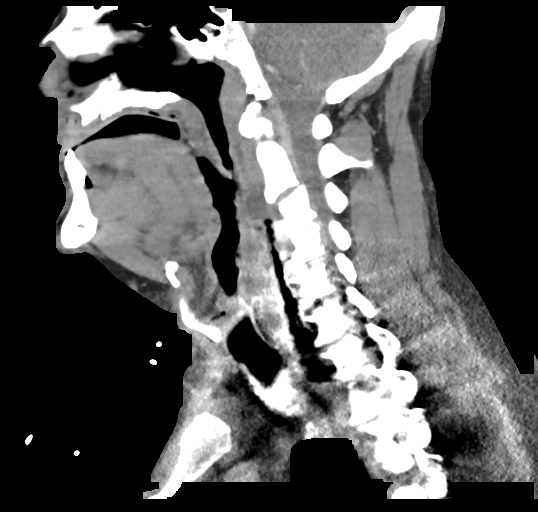
[im 47/93  bone]
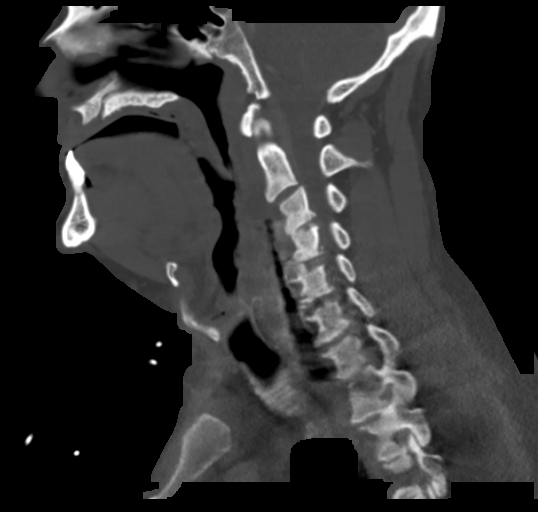
[im 54/93  bone]
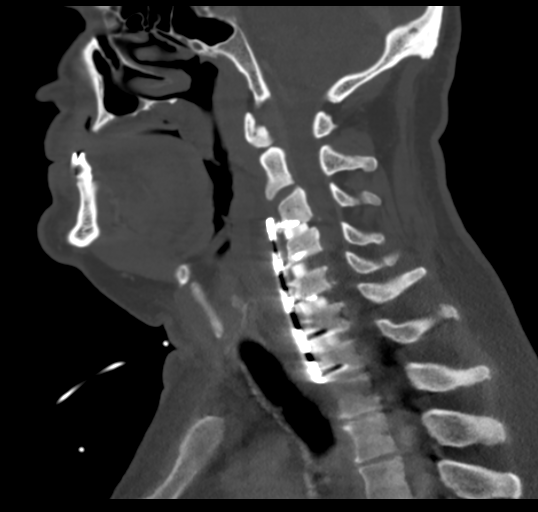
[im 62/93  bone]
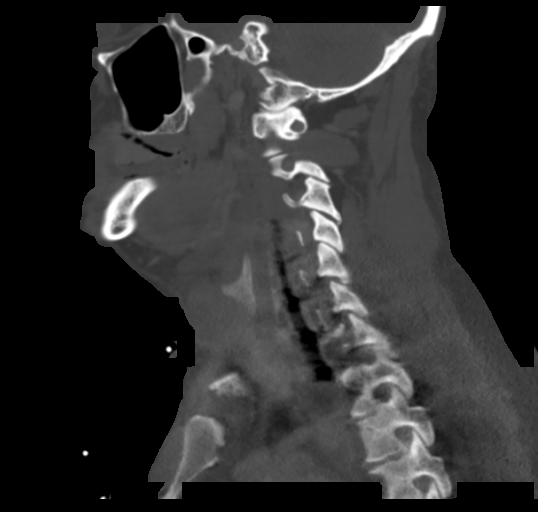

[Series 7: orthogonal st · axial · 0.44mm/px · z∈[-251,-155]mm · 3 of 120 slices shown]
[im 24/120  bone]
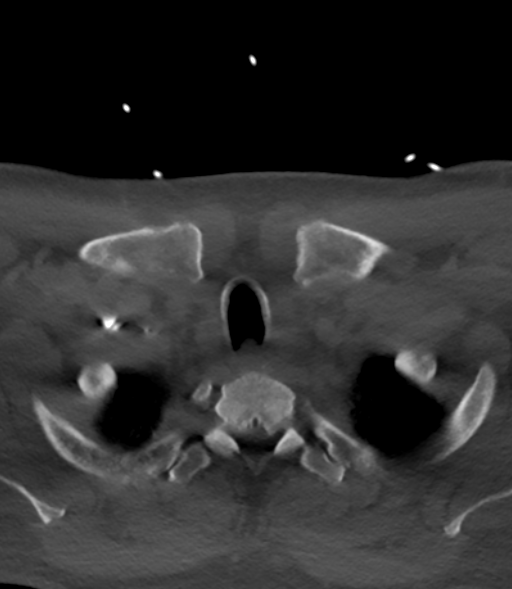
[im 48/120  bone]
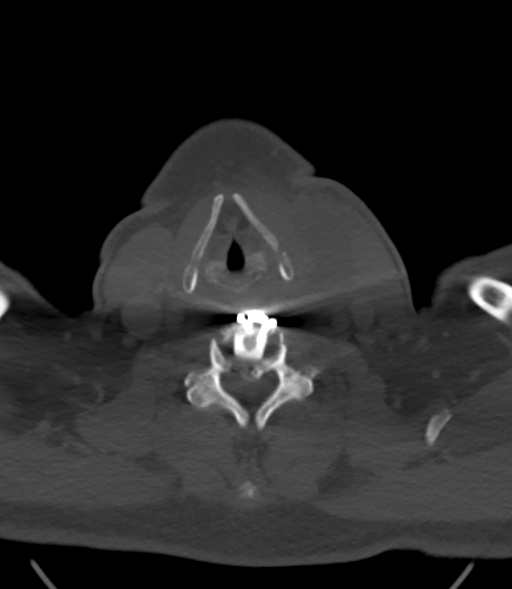
[im 72/120  bone]
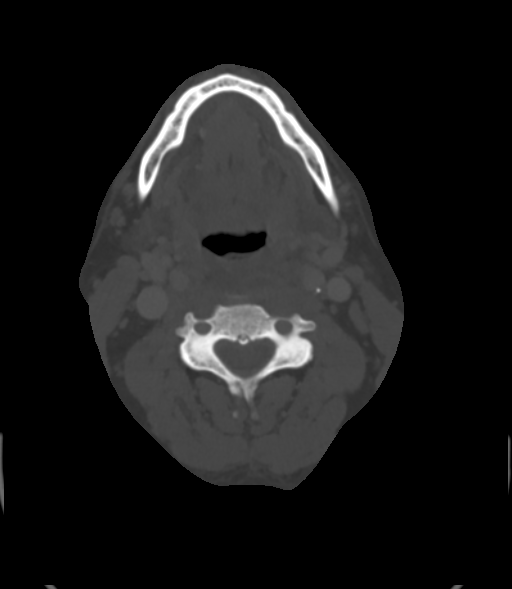

[15 of 33 positions shown; findings below may reference images not displayed]

FINDINGS: Pharynx and larynx: Mass effect from retropharyngeal/prevertebral
fluid collection as described below, though widely patent and
otherwise unremarkable.

Salivary glands: Normal.

Thyroid: Normal.

Lymph nodes: Normal.

Vascular: Normal.

Limited intracranial: Normal.

Visualized orbits: Normal.

Mastoids and visualized paranasal sinuses: Well aerated. Multiple
mandible dental caries and periapical lucencies. Absent maxillary
teeth.

Skeleton/soft tissues: Interval C3-4 thru C6-7 ACDF, intact plate,
which is not apposed to the anterior C3 or C4 vertebral bodies by 4
mm. No acute fracture. Straightened cervical lordosis. Multilevel at
least moderate neural foraminal narrowing incompletely imaged.
Prevertebral/ retropharyngeal effusion with focal ovoid 16 x 7 mm
fluid collection within LEFT longus coli muscle, contiguous with a
crescentic 5.9 x 2.4 cm fluid collection within LEFT neck, wrapping
posterior to the laryngeal cartilage, extending anteriorly within 1
cm of the skin surface. Evaluation of the fluid collection is
somewhat limited by streak artifact from hardware.

Upper chest: Patchy ground-glass opacities partially imaged in RIGHT
lung apex.
IMPRESSION: Recent C3-4 through C6-7 ACDF, upper aspect of the plate is not
apposed to the anterior vertebral bodies. Superimposed LEFT longus
coli /LEFT neck fluid collection measuring up to 5.9 x 2.4 cm, which
could reflect seroma, hematoma or possible early abscess, limited
assessment due to streak artifact from hardware.

Partially imaged ground-glass opacities RIGHT lung apex may be
infectious or inflammatory.

By: PETERSEN

## 2014-12-07 IMAGING — CT CT HEAD W/O CM
1 series · 16 of 30 positions shown, 20 images · non-contrast
Comparison: [DATE]

CLINICAL DATA: Acute confusion and altered mental status.
Hallucinations.

EXAM:
CT HEAD WITHOUT CONTRAST
TECHNIQUE: Contiguous axial images were obtained from the base of the skull
through the vertex without intravenous contrast.

[Series 3: head 5.0 h30s · axial · 0.49mm/px · z∈[-123,+22]mm · 16 of 33 slices shown, 20 images]
[im 2/33  brain]
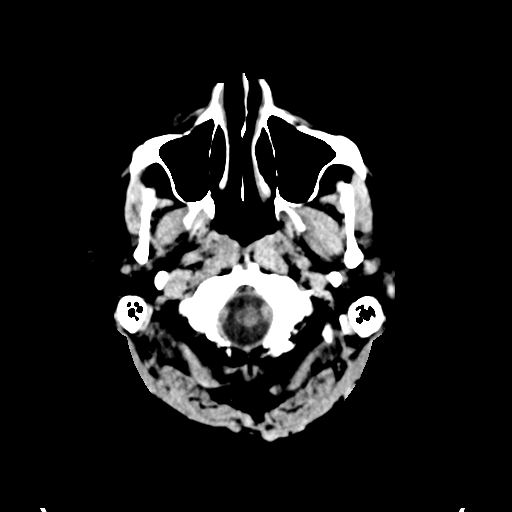
[im 2/33  bone]
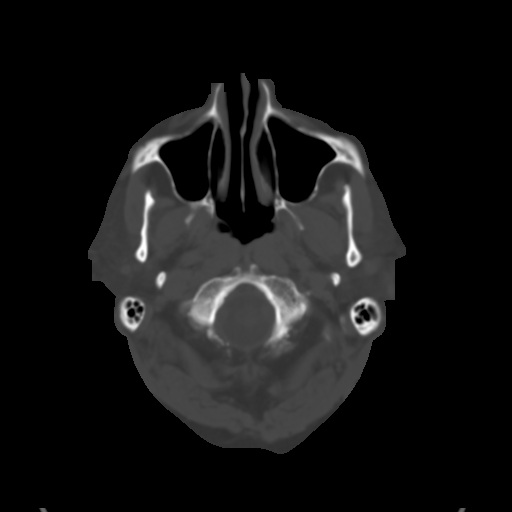
[im 4/33  brain]
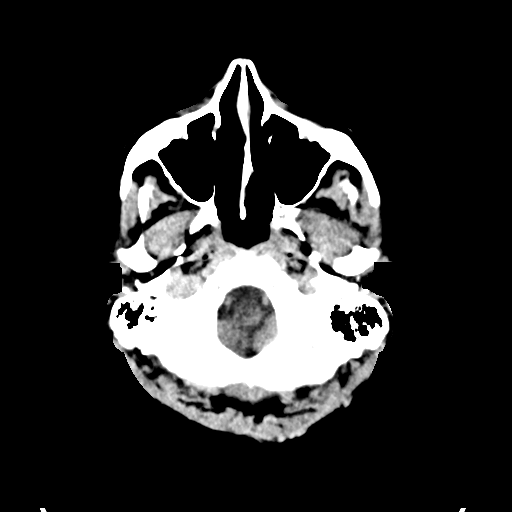
[im 6/33  brain]
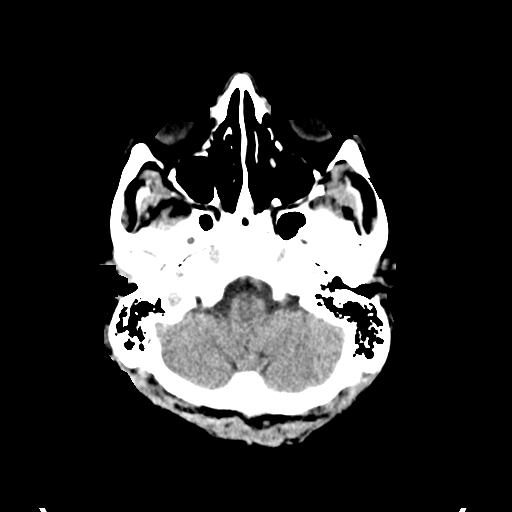
[im 8/33  brain]
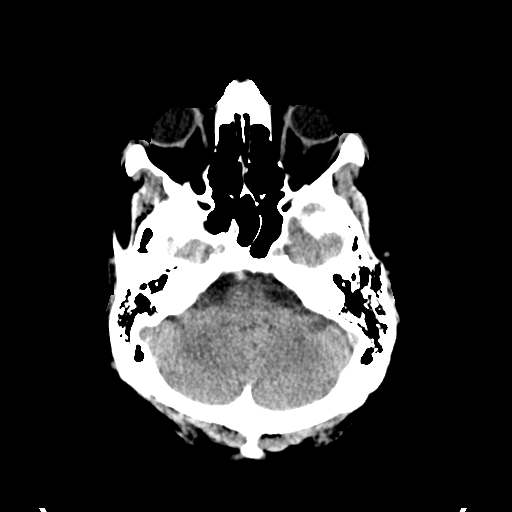
[im 9/33  brain]
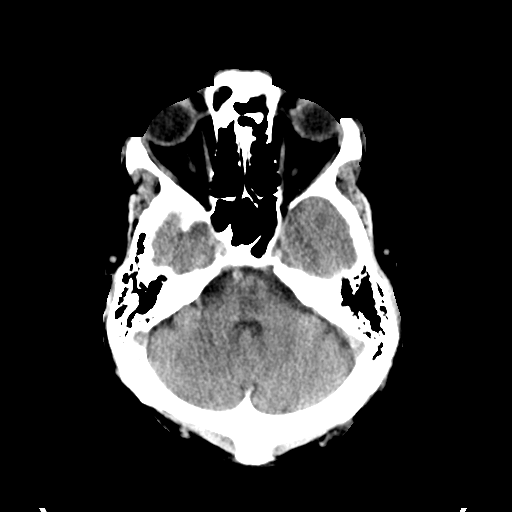
[im 9/33  bone]
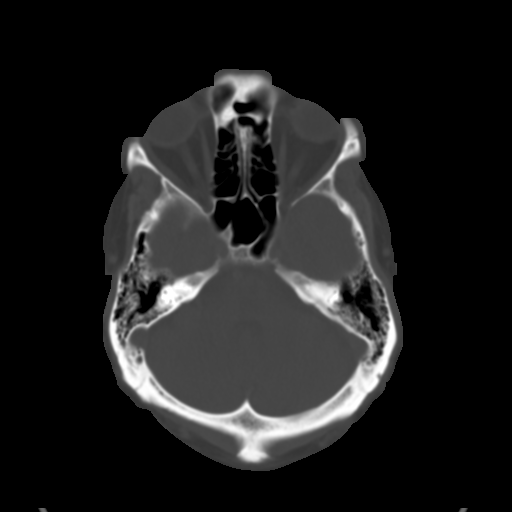
[im 12/33  brain]
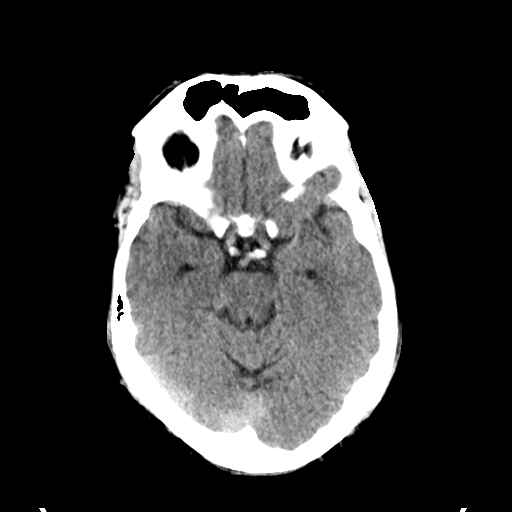
[im 14/33  brain]
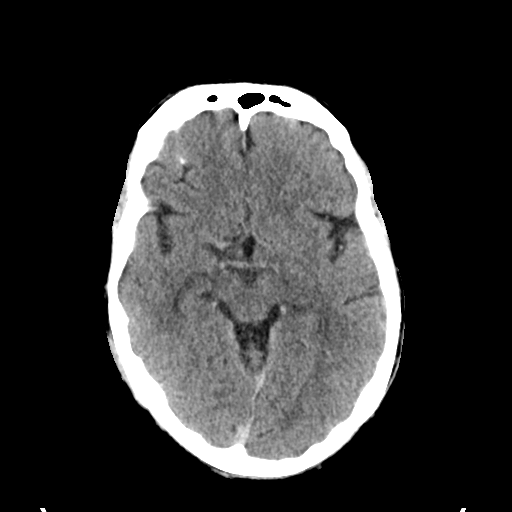
[im 16/33  brain]
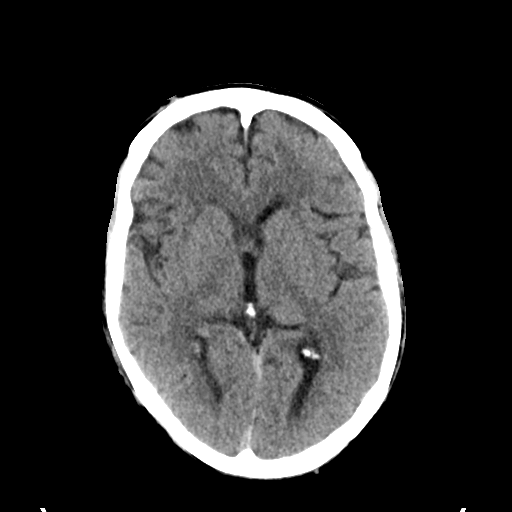
[im 17/33  brain]
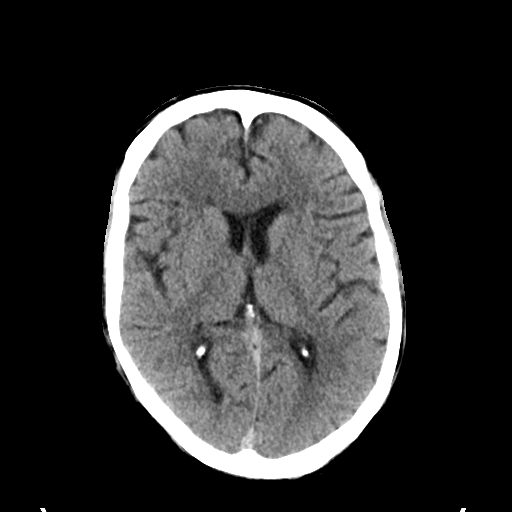
[im 17/33  bone]
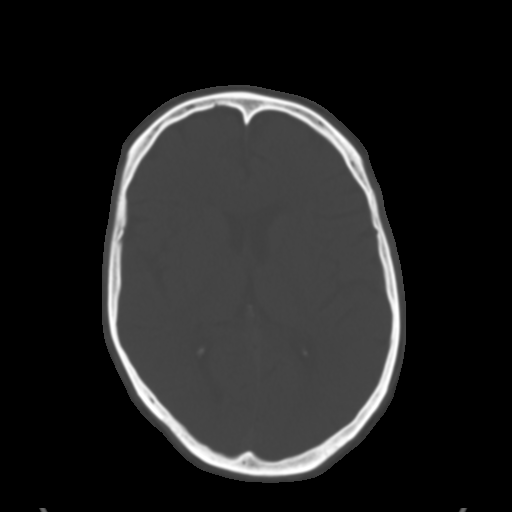
[im 19/33  brain]
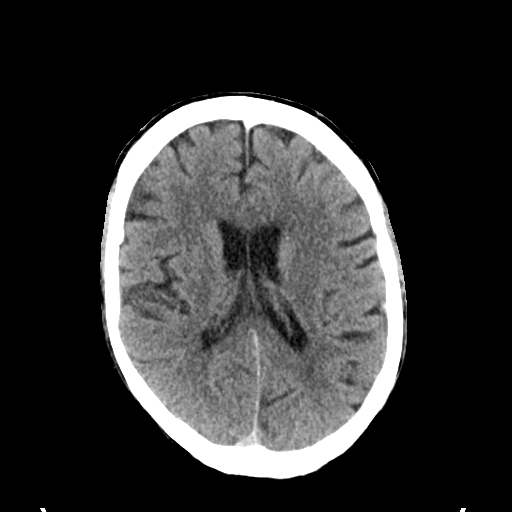
[im 21/33  brain]
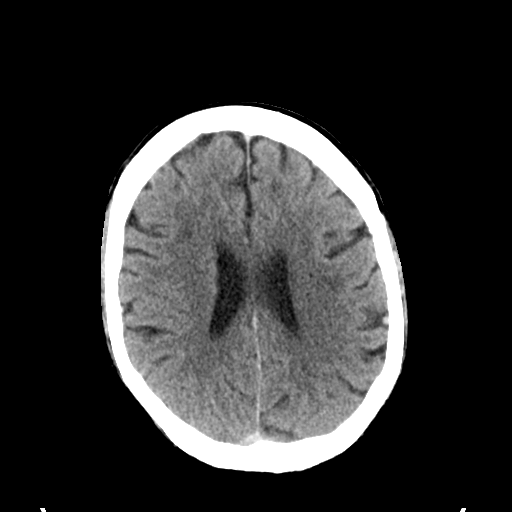
[im 24/33  brain]
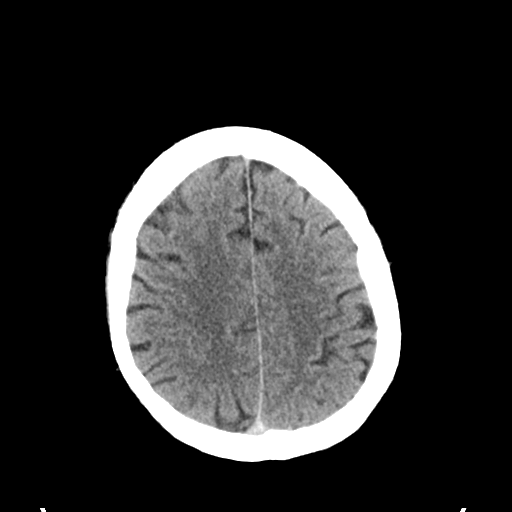
[im 25/33  brain]
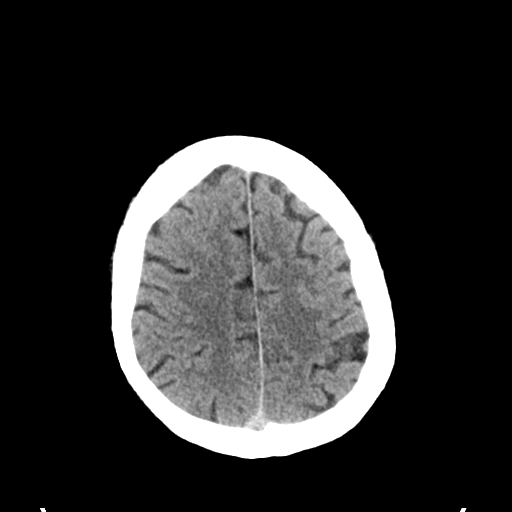
[im 25/33  bone]
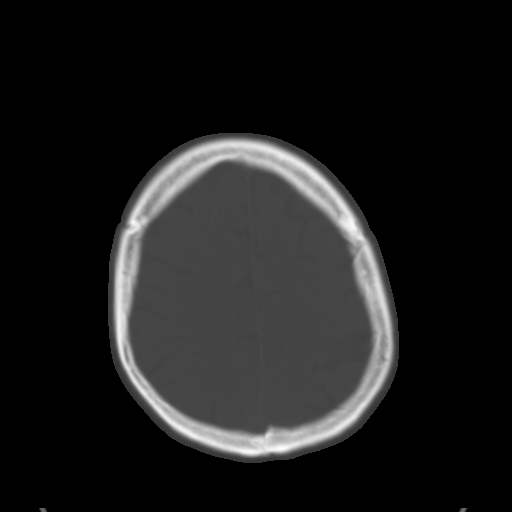
[im 27/33  brain]
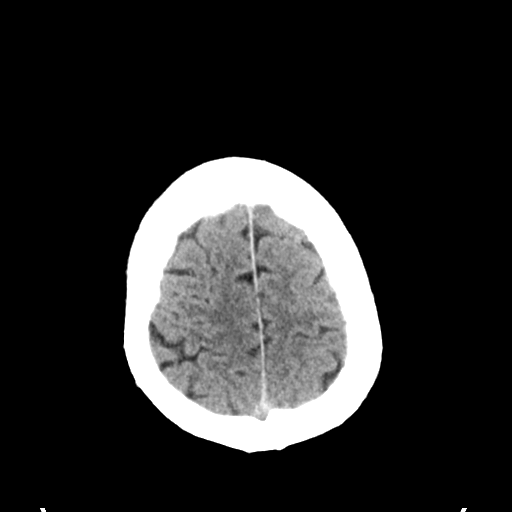
[im 29/33  brain]
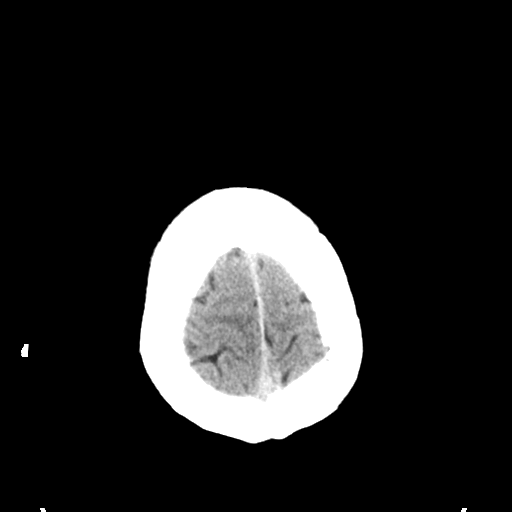
[im 31/33  brain]
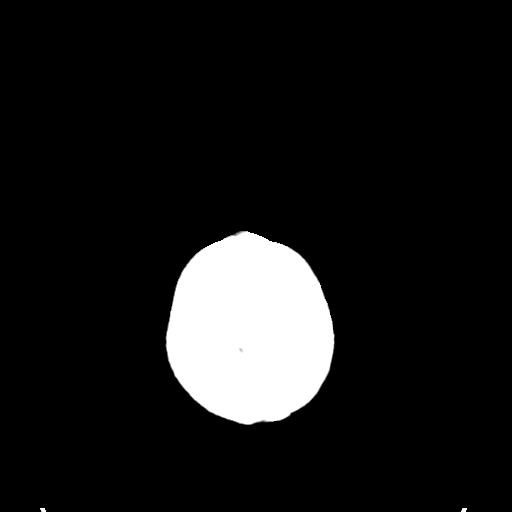

[16 of 30 positions shown; findings below may reference images not displayed]

FINDINGS: Mild chronic small-vessel white matter ischemic changes are again
noted.

No acute intracranial abnormalities are identified, including mass
lesion or mass effect, hydrocephalus, extra-axial fluid collection,
midline shift, hemorrhage, or acute infarction.

The visualized bony calvarium is unremarkable.
IMPRESSION: No evidence of acute intracranial abnormality.

Mild chronic small-vessel white matter ischemic changes.

## 2014-12-07 MED ORDER — CYCLOBENZAPRINE HCL 10 MG PO TABS
10.0000 mg | ORAL_TABLET | Freq: Three times a day (TID) | ORAL | Status: DC | PRN
  Administered 2014-12-08: 10 mg via ORAL
  Filled 2014-12-07: qty 1

## 2014-12-07 MED ORDER — ONDANSETRON HCL 4 MG/2ML IJ SOLN: 4.0000 mg | Freq: Four times a day (QID) | INTRAMUSCULAR | Status: DC | PRN

## 2014-12-07 MED ORDER — ACETAMINOPHEN 650 MG RE SUPP
650.0000 mg | Freq: Four times a day (QID) | RECTAL | Status: DC | PRN
Start: 2014-12-07 — End: 2014-12-09

## 2014-12-07 MED ORDER — ACETAMINOPHEN 325 MG PO TABS
650.0000 mg | ORAL_TABLET | Freq: Once | ORAL | Status: AC
  Administered 2014-12-07: 650 mg via ORAL
  Filled 2014-12-07: qty 2

## 2014-12-07 MED ORDER — DEXTROSE 5 % IV SOLN
2.0000 g | Freq: Three times a day (TID) | INTRAVENOUS | Status: DC
  Administered 2014-12-07 – 2014-12-09 (×8): 2 g via INTRAVENOUS
  Filled 2014-12-07 (×11): qty 2

## 2014-12-07 MED ORDER — CEFTRIAXONE SODIUM IN DEXTROSE 40 MG/ML IV SOLN
2.0000 g | Freq: Once | INTRAVENOUS | Status: DC
  Filled 2014-12-07: qty 50

## 2014-12-07 MED ORDER — SODIUM CHLORIDE 0.9 % IV SOLN
2.0000 g | Freq: Once | INTRAVENOUS | Status: DC
  Filled 2014-12-07: qty 2000

## 2014-12-07 MED ORDER — VANCOMYCIN HCL IN DEXTROSE 1-5 GM/200ML-% IV SOLN
1000.0000 mg | Freq: Three times a day (TID) | INTRAVENOUS | Status: DC
  Administered 2014-12-07 – 2014-12-09 (×7): 1000 mg via INTRAVENOUS
  Filled 2014-12-07 (×10): qty 200

## 2014-12-07 MED ORDER — PIPERACILLIN-TAZOBACTAM 3.375 G IVPB 30 MIN
3.3750 g | Freq: Once | INTRAVENOUS | Status: AC
  Administered 2014-12-07: 3.375 g via INTRAVENOUS
  Filled 2014-12-07: qty 50

## 2014-12-07 MED ORDER — ONDANSETRON HCL 4 MG PO TABS: 4.0000 mg | ORAL_TABLET | Freq: Four times a day (QID) | ORAL | Status: DC | PRN

## 2014-12-07 MED ORDER — LISINOPRIL 10 MG PO TABS
10.0000 mg | ORAL_TABLET | Freq: Every day | ORAL | Status: DC
  Administered 2014-12-07 – 2014-12-09 (×3): 10 mg via ORAL
  Filled 2014-12-07: qty 2
  Filled 2014-12-07 (×2): qty 1

## 2014-12-07 MED ORDER — GABAPENTIN 600 MG PO TABS
600.0000 mg | ORAL_TABLET | Freq: Four times a day (QID) | ORAL | Status: DC
  Administered 2014-12-07 – 2014-12-09 (×8): 600 mg via ORAL
  Filled 2014-12-07 (×8): qty 1

## 2014-12-07 MED ORDER — INSULIN ASPART 100 UNIT/ML ~~LOC~~ SOLN
0.0000 [IU] | Freq: Three times a day (TID) | SUBCUTANEOUS | Status: DC
  Administered 2014-12-07 – 2014-12-08 (×2): 3 [IU] via SUBCUTANEOUS
  Administered 2014-12-08 (×2): 5 [IU] via SUBCUTANEOUS
  Administered 2014-12-09: 3 [IU] via SUBCUTANEOUS
  Administered 2014-12-09: 8 [IU] via SUBCUTANEOUS

## 2014-12-07 MED ORDER — IOHEXOL 300 MG/ML  SOLN
75.0000 mL | Freq: Once | INTRAMUSCULAR | Status: AC | PRN
  Administered 2014-12-07: 75 mL via INTRAVENOUS

## 2014-12-07 MED ORDER — VANCOMYCIN HCL IN DEXTROSE 1-5 GM/200ML-% IV SOLN: 1000.0000 mg | Freq: Once | INTRAVENOUS | Status: DC

## 2014-12-07 MED ORDER — CITALOPRAM HYDROBROMIDE 20 MG PO TABS
40.0000 mg | ORAL_TABLET | Freq: Every day | ORAL | Status: DC
  Administered 2014-12-07 – 2014-12-09 (×3): 40 mg via ORAL
  Filled 2014-12-07 (×3): qty 2

## 2014-12-07 MED ORDER — CEFTRIAXONE SODIUM IN DEXTROSE 40 MG/ML IV SOLN: 2.0000 g | Freq: Two times a day (BID) | INTRAVENOUS | Status: DC

## 2014-12-07 MED ORDER — SODIUM CHLORIDE 0.9 % IV BOLUS (SEPSIS)
1000.0000 mL | Freq: Once | INTRAVENOUS | Status: AC
  Administered 2014-12-07: 1000 mL via INTRAVENOUS

## 2014-12-07 MED ORDER — NICOTINE 21 MG/24HR TD PT24
21.0000 mg | MEDICATED_PATCH | Freq: Every day | TRANSDERMAL | Status: DC
  Administered 2014-12-07 – 2014-12-09 (×3): 21 mg via TRANSDERMAL
  Filled 2014-12-07 (×4): qty 1

## 2014-12-07 MED ORDER — VANCOMYCIN HCL IN DEXTROSE 1-5 GM/200ML-% IV SOLN
1000.0000 mg | Freq: Once | INTRAVENOUS | Status: AC
  Administered 2014-12-07: 1000 mg via INTRAVENOUS
  Filled 2014-12-07: qty 200

## 2014-12-07 MED ORDER — NICOTINE 21 MG/24HR TD PT24
21.0000 mg | MEDICATED_PATCH | Freq: Once | TRANSDERMAL | Status: AC
  Administered 2014-12-07: 21 mg via TRANSDERMAL
  Filled 2014-12-07: qty 1

## 2014-12-07 MED ORDER — ACETAMINOPHEN 325 MG PO TABS: 650.0000 mg | ORAL_TABLET | Freq: Four times a day (QID) | ORAL | Status: DC | PRN

## 2014-12-07 MED ORDER — INSULIN ASPART 100 UNIT/ML ~~LOC~~ SOLN: 0.0000 [IU] | Freq: Four times a day (QID) | SUBCUTANEOUS | Status: DC | PRN

## 2014-12-07 MED ORDER — SODIUM CHLORIDE 0.9 % IJ SOLN
3.0000 mL | Freq: Two times a day (BID) | INTRAMUSCULAR | Status: DC
  Administered 2014-12-07: 3 mL via INTRAVENOUS

## 2014-12-07 MED ORDER — SODIUM CHLORIDE 0.9 % IV SOLN
2.0000 g | INTRAVENOUS | Status: DC
  Filled 2014-12-07 (×3): qty 2000

## 2014-12-07 MED ORDER — CEFTRIAXONE SODIUM 2 G IJ SOLR: 2.0000 g | Freq: Once | INTRAMUSCULAR | Status: DC

## 2014-12-07 MED ORDER — OXYCODONE HCL 5 MG PO TABS
15.0000 mg | ORAL_TABLET | ORAL | Status: DC | PRN
  Administered 2014-12-07 – 2014-12-09 (×7): 15 mg via ORAL
  Filled 2014-12-07 (×7): qty 3

## 2014-12-07 NOTE — ED Notes (Signed)
Patient transported to X-ray 

## 2014-12-07 NOTE — ED Notes (Addendum)
Pt complaining of a headache that he believes started last night. Daughter thinks that it is possible he was complaining of a headache then.

## 2014-12-07 NOTE — Progress Notes (Addendum)
ANTIBIOTIC CONSULT NOTE - INITIAL  Pharmacy Consult for vancomycin and cefepime Indication: post-op CNS infection  No Known Allergies  Patient Measurements: Height: 6\' 3"  (190.5 cm) Weight: 250 lb (113.399 kg) IBW/kg (Calculated) : 84.5  Vital Signs: Temp: 102 F (38.9 C) (04/26 0354) Temp Source: Rectal (04/26 0354) BP: 136/85 mmHg (04/26 0445) Pulse Rate: 103 (04/26 0445)  Labs:  Recent Labs  12/06/14 2254 12/06/14 2323  WBC 13.1*  --   HGB 15.8 17.0  PLT 168  --   CREATININE  --  1.10   Estimated Creatinine Clearance: 103.1 mL/min (by C-G formula based on Cr of 1.1).   Medical History: Past Medical History  Diagnosis Date  . Diabetes mellitus without complication   . Hypertension     Medications:  Prescriptions prior to admission  Medication Sig Dispense Refill Last Dose  . CITALOPRAM HYDROBROMIDE PO Take by mouth.     . Cyclobenzaprine HCl (FLEXERIL PO) Take by mouth.     . Liraglutide (VICTOZA Gore) Inject into the skin.     Marland Kitchen. METFORMIN HCL PO Take by mouth.     . OXYCODONE HCL PO Take by mouth.      Scheduled:  . nicotine  21 mg Transdermal Once  . sodium chloride  3 mL Intravenous Q12H    Assessment: 54yo male had recent C3-C7 anterior fusion, now presents w/ AMS and hallucinations w/ nodule at surgical site, CT shows possible early abscess vs hematoma/seroma; concern for meningitis as pt and daughter report HA, to begin IV ABX.  Goal of Therapy:  Vancomycin trough level 15-20 mcg/ml  Plan:  Rec'd vanc 1g IV in ED; will continue with vancomycin 1000mg  IV Q8H (starting immediately for total vanc load of 2g), Rocephin 2g IV Q12H and cefepime 2g IV Q8H and monitor CBC, Cx, levels prn.  Vernard GamblesVeronda Azyah Flett, PharmD, BCPS  12/07/2014,5:14 AM

## 2014-12-07 NOTE — H&P (Signed)
[facility] History and Physical  Patient: Cody Pena  MRN: [id number]  DOB: 1961/03/15  DOS: the patient was seen and examined on 12/07/2014 PCP: Aida Puffer, MD  Referring physician: Dr. Patria Mane Chief Complaint: Confusion  HPI: Cody Pena is a 54 y.o. male with Past medical history of diabetes mellitus, hypertension, recent cervical fusion. The patient is presenting with complaints of confusion. The history was (from patient as well as his daughter. The patient recently underwent cervical fusion surgery with Dr. Jeral Fruit on 11/24/2014. 2 days after the surgery the patient started developing swelling on the left side of his neck. 3 days after that the patient started having pain in the neck. Later on patient started having complaints of fever as well as chills. There is also complaining of muffled voice. Patient did not have any difficulties breathing did not have any difficulty swallowing. Since last 2 days the patient has been not acting himself and has been having significant confusion. Daughter also reported possible hallucination. There was no fall no trauma no injury reported. The patient was initially on hydrocodone and postoperatively he was placed on oxycodone. No other recent change in his medications reported. Patient is compliant with all his medications. Patient has chronic cough and in this active smoker. Does not have any diarrhea constipation burning urination. Does not have any other focal deficit.  The patient is coming from home. And at his baseline independent for most of his ADL.  Review of Systems: as mentioned in the history of present illness.  A comprehensive review of the other systems is negative.  Past Medical History  Diagnosis Date  . Diabetes mellitus without complication   . Hypertension    Past Surgical History  Procedure Laterality Date  . Cervical fusion     Social History:  reports that he has been smoking.  He does not  have any smokeless tobacco history on file. He reports that he does not drink alcohol or use illicit drugs.  No Known Allergies  No family history on file.  Prior to Admission medications   Medication Sig Start Date End Date Taking? Authorizing Provider  CITALOPRAM HYDROBROMIDE PO Take by mouth.   Yes Historical Provider, MD  Cyclobenzaprine HCl (FLEXERIL PO) Take by mouth.   Yes Historical Provider, MD  Liraglutide (VICTOZA [place]) Inject into the skin.   Yes Historical Provider, MD  METFORMIN HCL PO Take by mouth.   Yes Historical Provider, MD  OXYCODONE HCL PO Take by mouth.   Yes Historical Provider, MD    Physical Exam: Filed Vitals:   12/07/14 0330 12/07/14 0354 12/07/14 0445 12/07/14 0523  BP: 129/88  136/85 115/71  Pulse: 109  103 105  Temp:  102 F (38.9 C)  98.9 F (37.2 C)  TempSrc:  Rectal  Oral  Resp: Height:     (1.905 m)  Weight:    113.399 kg (250 lb)  SpO2: 93%  96% 97%    General: Alert, Awake and Oriented to Time, Place and Person. Appear in moderate distress Eyes: PERRL ENT: Oral Mucosa clear dry. Neck: no JVD, swelling with warmth and redness noted at the left base of neck Cardiovascular: S1 and S2 Present, no Murmur, Peripheral Pulses Present Respiratory: Bilateral Air entry equal and Decreased,  Clear to Auscultation, no Crackles, no wheezes Abdomen: Bowel Sound present, Soft and non tender Skin: no Rash Extremities: no Pedal edema, no calf tenderness Neurologic: Asterixis noted on the right arm which is  new per patient  Labs on Admission:  CBC:  Recent Labs Lab 12/06/14 2254 12/06/14 2323  WBC 13.1*  --   NEUTROABS 9.4*  --   HGB 15.8 17.0  HCT 45.8 50.0  MCV 95.0  --   PLT 168  --     CMP     Component Value Date/Time   NA 132* 12/06/2014 2323   K 4.5 12/06/2014 2323   CL 92* 12/06/2014 2323   GLUCOSE 222* 12/06/2014 2323   BUN 15 12/06/2014 2323   CREATININE 1.10 12/06/2014 2323    No results for input(s):  LIPASE, AMYLASE in the last 168 hours.  No results for input(s): CKTOTAL, CKMB, CKMBINDEX, TROPONINI in the last 168 hours. BNP (last 3 results) No results for input(s): BNP in the last 8760 hours.  ProBNP (last 3 results) No results for input(s): PROBNP in the last 8760 hours.   Radiological Exams on Admission: Dg Chest 2 View  12/07/2014   CLINICAL DATA:  54 year old male with cough, fever and altered mental status with hallucinations  EXAM: CHEST  2 VIEW  COMPARISON:  Prior chest x-ray 09/21/2013  FINDINGS: Stable cardiac and mediastinal contours. Low inspiratory volumes which results in progressed appearance of central airway thickening and interstitial prominence. No focal airspace consolidation, pleural effusion or pneumothorax. The lateral view appears to be an expiration. Incompletely imaged anterior cervical fusion hardware. No acute osseous abnormality.  IMPRESSION: Low inspiratory volumes. Otherwise, no acute cardiopulmonary process.   Electronically Signed   By: Malachy MoanHeath  McCullough M.D.   On: 12/07/2014 01:25   Ct Head Wo Contrast  12/07/2014   CLINICAL DATA:  Acute confusion and altered mental status. Hallucinations.  EXAM: CT HEAD WITHOUT CONTRAST  TECHNIQUE: Contiguous axial images were obtained from the base of the skull through the vertex without intravenous contrast.  COMPARISON:  12/07/2014  FINDINGS: Mild chronic small-vessel white matter ischemic changes are again noted.  No acute intracranial abnormalities are identified, including mass lesion or mass effect, hydrocephalus, extra-axial fluid collection, midline shift, hemorrhage, or acute infarction.  The visualized bony calvarium is unremarkable.  IMPRESSION: No evidence of acute intracranial abnormality.  Mild chronic small-vessel white matter ischemic changes.   Electronically Signed   By: Harmon PierJeffrey  Hu M.D.   On: 12/07/2014 04:32   Ct Soft Tissue Neck W Contrast  12/07/2014   CLINICAL DATA:  Anterior fusion performed five days  ago, now with fever, and neck pain. Assess for infection.  EXAM: CT NECK WITH CONTRAST  TECHNIQUE: Multidetector CT imaging of the neck was performed using the standard protocol following the bolus administration of intravenous contrast.  CONTRAST:  75mL OMNIPAQUE IOHEXOL 300 MG/ML  SOLN  COMPARISON:  MRI of the cervical spine July 17, 2014  FINDINGS: Pharynx and larynx: Mass effect from retropharyngeal/prevertebral fluid collection as described below, though widely patent and otherwise unremarkable.  Salivary glands: Normal.  Thyroid: Normal.  Lymph nodes: Normal.  Vascular: Normal.  Limited intracranial: Normal.  Visualized orbits: Normal.  Mastoids and visualized paranasal sinuses: Well aerated. Multiple mandible dental caries and periapical lucencies. Absent maxillary teeth.  Skeleton/soft tissues: Interval C3-4 thru C6-7 ACDF, intact plate, which is not apposed to the anterior C3 or C4 vertebral bodies by 4 mm. No acute fracture. Straightened cervical lordosis. Multilevel at least moderate neural foraminal narrowing incompletely imaged. Prevertebral/ retropharyngeal effusion with focal ovoid 16 x 7 mm fluid collection within LEFT longus coli muscle, contiguous with a crescentic 5.9 x 2.4 cm fluid collection within LEFT  neck, wrapping posterior to the laryngeal cartilage, extending anteriorly within 1 cm of the skin surface. Evaluation of the fluid collection is somewhat limited by streak artifact from hardware.  Upper chest: Patchy ground-glass opacities partially imaged in RIGHT lung apex.  IMPRESSION: Recent C3-4 through C6-7 ACDF, upper aspect of the plate is not apposed to the anterior vertebral bodies. Superimposed LEFT longus coli /LEFT neck fluid collection measuring up to 5.9 x 2.4 cm, which could reflect seroma, hematoma or possible early abscess, limited assessment due to streak artifact from hardware.  Partially imaged ground-glass opacities RIGHT lung apex may be infectious or inflammatory.    Electronically Signed   By: Awilda Metro   On: 12/07/2014 02:39    Assessment/Plan Principal Problem:   Sepsis Active Problems:   Delirium   Retropharyngeal abscess   Cervical vertebral fusion   Hallucination   Diabetes mellitus   Mood disorder   1. Sepsis The patient is presenting with complaints of confusion. Patient does not have any focal deficit other than asterixis. CT of the head is negative. CT of the neck is showing left neck fluid collection associated with left longus coli fluid collection suspicious for seroma versus abscess. Patient is also acting delirious suspicious for encephalopathy. With this the patient will be admitted in telemetry. I will get an EEG. I would place the patient on broad-spectrum antibiotics with vancomycin and cefepime to cover him for any CNS infection specifically has had associated. Patient does not appear to have any meningitic signs. Follow cultures. Neuro-surgery has been consulted who will be following up with the patient. We recommended to keep the patient nothing by mouth.  2. Diabetes mellitus. Checking hemoglobin A1c and placing the patient on sliding scale.  3. History of mood disorder. Currently in the setting of acute encephalopathy holding off on his home Celexa as well as Lexapro until I have home dosing information.  4. Active smoker. Nicotine patch.  5. Chronic opioid use. Reportedly the patient is on chronic hydrocodone and was recently placed on Oxycodone, will need information from the pharmacy since UDS is clear at present.  Advance goals of care discussion: Full code   Consults: ED discussed with Dr. Lovell Sheehan from neurosurgery.  DVT Prophylaxis:mechanical compression device Nutrition: Nothing by mouth except medications  Family Communication: family was present at bedside, opportunity was given to ask question and all questions were answered satisfactorily at the time of interview. Disposition: Admitted as  inpatient, telemetry unit.  Author: Lynden Oxford, MD [facility] Pager: [phone] 12/07/2014  If 7PM-7AM, please contact night-coverage [email or web address] Password [email or web address]

## 2014-12-07 NOTE — ED Notes (Signed)
Cody RiedelNoah, NT at bedside

## 2014-12-07 NOTE — ED Notes (Addendum)
Pt states that yesterday he was having double vision. Pts daughter states that this did occur. Pt denies having double vision at this time.

## 2014-12-07 NOTE — Progress Notes (Signed)
Patient ID: Cody CarrelRobert K Pena, male   DOB: 05-Dec-1960, 54 y.o.   MRN: [id number] Spoke with mr Carleene Cooperuggle and family. C/o pain between shoulder with no weakness. This type of pain is normal postop and can last 4 to 6 weeks.some swelling in the  Cervical wound can be a localized hematoma. The ct spine shows a fluid collection with NO displacement of the airways. Wbc is slight increase but he was on prednisone in the postoperative period.  Will continue to follow. Thanks. Spoke with dr Janee Mornhompson

## 2014-12-07 NOTE — ED Notes (Addendum)
Pt was grabbing at the air with his hands saying "the hooks, I'm trying to move them, they won't move". The pts daughter believes that the pt may have been talking about the curtain hooks.

## 2014-12-07 NOTE — Progress Notes (Signed)
EEG Completed; Results Pending  

## 2014-12-07 NOTE — Procedures (Signed)
EEG report.  Brief clinical history: 54 y.o. male with Past medical history of diabetes mellitus, hypertension, recent cervical fusion. The patient is presenting with complaints of confusion.  Technique: this is a 17 channel routine scalp EEG performed at the bedside with bipolar and monopolar montages arranged in accordance to the international 10/20 system of electrode placement. One channel was dedicated to EKG recording.  No sleep was obtained during recording. No activating procedures performed.  Description:In the wakeful state, the best background consisted of a medium amplitude, posterior dominant, poorly sustained, symmetric and reactive 8 Hz rhythm. No focal or generalized epileptiform discharges noted.  No pathologic areas of slowing seen.  EKG showed sinus rhythm.  Impression: this is a normal awake EEG. Please, be aware that a normal EEG does not exclude the possibility of epilepsy.  Clinical correlation is advised.  Wyatt Portelasvaldo Eryca Bolte, MD [facility]

## 2014-12-07 NOTE — ED Notes (Addendum)
Pt seems to be hallucinating. Pt is talking about the "tiles on the wall, you don't have enough". Pt will grab at the air sometimes as if he is trying to get something that is not there.   Pts daughter states that when this RN was absent from the room the pt was attempting to remove the vitals equipment and wanted to get up from the bed. She also stated that the pt was saying that he wanted to go outside and smoke a cigarette. She stated that he is very anxious and prior to his surgery and they had to give him ativan.

## 2014-12-07 NOTE — Progress Notes (Signed)
I have seen and assessed patient and agree with Dr. Eliane DecreePatel's assessment and plan. Patient more alert and oriented to self and place. Patient currently at baseline per daughter. EEG has been done which is negative for any seizure activity. Continue empiric IV antibiotics. Neurosurgery following. Will advance patient's diet to a copy modified diet. Follow.

## 2014-12-07 NOTE — ED Notes (Addendum)
Pt on 2L [place].  

## 2014-12-07 NOTE — ED Notes (Signed)
Dr. Patel at bedside 

## 2014-12-07 NOTE — Progress Notes (Signed)
ABG results given to RN. RT recommended that pt be put back on 2L .

## 2014-12-08 LAB — GLUCOSE, CAPILLARY
GLUCOSE-CAPILLARY: 171 mg/dL — AB (ref 70–99)
Glucose-Capillary: 241 mg/dL — ABNORMAL HIGH (ref 70–99)
Glucose-Capillary: 209 mg/dL — ABNORMAL HIGH (ref 70–99)
Glucose-Capillary: 158 mg/dL — ABNORMAL HIGH (ref 70–99)

## 2014-12-08 LAB — HEMOGLOBIN A1C
Hgb A1c MFr Bld: 8.1 % — ABNORMAL HIGH (ref 4.8–5.6)
Mean Plasma Glucose: 186 mg/dL

## 2014-12-08 NOTE — Progress Notes (Signed)
Inpatient Diabetes Program Recommendations  AACE/ADA: New Consensus Statement on Inpatient Glycemic Control (2013)  Target Ranges:  Prepandial:   less than 140 mg/dL      Peak postprandial:   less than 180 mg/dL (1-2 hours)      Critically ill patients:  140 - 180 mg/dL     Results for Cody Pena, Cody Pena (MRN [id number]) as of 12/08/2014 11:55  Ref. Range 12/07/2014 05:29 12/07/2014 17:08 12/07/2014 21:35  Glucose-Capillary Latest Ref Range: 70-99 mg/dL 244179 (H) 010172 (H) 272196 (H)    Results for Cody Pena, Cody Pena (MRN [id number]) as of 12/08/2014 11:55  Ref. Range 12/08/2014 07:20 12/08/2014 11:40  Glucose-Capillary Latest Ref Range: 70-99 mg/dL 742171 (H) 595241 (H)     Admit with: AMS/ Sepsis  History: DM, HTN  Home DM Meds: Metformin 500 mg daily       Victoza 1.2 mg daily  Current DM Orders: Novolog Moderate SSI     **Patient eating 90-100% of meals.    **Having some issues with postprandial glucose elevations.     MD- Please consider adding low dose Novolog Meal Coverage-  Novolog 3 units tid with meals     Will follow Ambrose FinlandJeannine Johnston Nychelle Cassata RN, MSN, CDE Diabetes Coordinator Inpatient Diabetes Program Team Pager: [phone] (8a-5p)

## 2014-12-08 NOTE — Progress Notes (Signed)
Patient ID: Cody CarrelRobert K Pena, male   DOB: [birth year], 54 y.o.   MRN: [id number] Doing well , no pain, no weakness. i will see him in 3 weeks.

## 2014-12-08 NOTE — Progress Notes (Signed)
[facility] PROGRESS NOTE  Cody Pena ZOX:[id number] DOB: [birth year] DOA: 12/06/2014 PCP: Aida Puffer, MD  Assessment/Plan: 1. Sepsis: Resolved. Would resume IV antibiotics for 24 hours and discontinue if negaive cultures.  Neuro surgery consulted for the neck mass. Non further inventions needed at this time. Outpatient follow up .   Diabetes mellitus: CBG (last 3)   Recent Labs  12/08/14 0720 12/08/14 1140 12/08/14 1553  GLUCAP 171* 241* 209*    Resume SSI.   MOOD disorder: Resume home meds on discharge.     Code Status: full code.  Family Communication: none at bedside. Disposition Plan: pending, possible home tomorrow.    Consultants:  Neuro surgery.   Procedures:  Ct neck  Antibiotics:  Vancomycin   Cefepime.   HPI/Subjective: Alert afebrile no new complaints.   Objective: Filed Vitals:   12/08/14 1737  BP: 129/81  Pulse: 97  Temp: 97.9 F (36.6 C)  Resp: 18    Intake/Output Summary (Last 24 hours) at 12/08/14 1836 Last data filed at 12/08/14 1737  Gross per 24 hour  Intake   1290 ml  Output   1151 ml  Net    139 ml   Filed Weights   12/06/14 2240 12/07/14 0523 12/08/14 0500  Weight: 113.399 kg (250 lb) 113.399 kg (250 lb) 108.5 kg (239 lb 3.2 oz)    Exam:   General:  Alert afebrile comfortable  Cardiovascular: s1s2  Respiratory: ctab  Abdomen: soft non tender non distended bowel sounds heard  Musculoskeletal: no pedal edema.   Data Reviewed: Basic Metabolic Panel:  Recent Labs Lab 12/06/14 2323 12/07/14 1015  NA 132* 135  K 4.5 4.2  CL 92* 99  CO2  --  27  GLUCOSE 222* 199*  BUN 15 9  CREATININE 1.10 0.97  CALCIUM  --  8.5  MG  --  2.0   Liver Function Tests: No results for input(s): AST, ALT, ALKPHOS, BILITOT, PROT, ALBUMIN in the last 168 hours. No results for input(s): LIPASE, AMYLASE in the last 168 hours. No results for input(s): AMMONIA in the last 168 hours. CBC:  Recent Labs Lab  12/06/14 2254 12/06/14 2323 12/07/14 1015  WBC 13.1*  --  12.3*  NEUTROABS 9.4*  --  8.9*  HGB 15.8 17.0 15.7  HCT 45.8 50.0 45.9  MCV 95.0  --  95.4  PLT 168  --  164   Cardiac Enzymes: No results for input(s): CKTOTAL, CKMB, CKMBINDEX, TROPONINI in the last 168 hours. BNP (last 3 results) No results for input(s): BNP in the last 8760 hours.  ProBNP (last 3 results) No results for input(s): PROBNP in the last 8760 hours.  CBG:  Recent Labs Lab 12/07/14 1708 12/07/14 2135 12/08/14 0720 12/08/14 1140 12/08/14 1553  GLUCAP 172* 196* 171* 241* 209*    Recent Results (from the past 240 hour(s))  Blood culture (routine x 2)     Status: None (Preliminary result)   Collection Time: 12/07/14 12:45 AM  Result Value Ref Range Status   Specimen Description BLOOD RIGHT ANTECUBITAL  Final   Special Requests BOTTLES DRAWN AEROBIC AND ANAEROBIC 5CC EA  Final   Culture   Final           BLOOD CULTURE RECEIVED NO GROWTH TO DATE CULTURE WILL BE HELD FOR 5 DAYS BEFORE ISSUING A FINAL NEGATIVE REPORT Performed at [facility]    Report Status PENDING  Incomplete  Blood culture (routine x 2)     Status: None (Preliminary  result)   Collection Time: 12/07/14 12:57 AM  Result Value Ref Range Status   Specimen Description BLOOD LEFT ANTECUBITAL  Final   Special Requests BOTTLES DRAWN AEROBIC AND ANAEROBIC 10CC EA  Final   Culture   Final           BLOOD CULTURE RECEIVED NO GROWTH TO DATE CULTURE WILL BE HELD FOR 5 DAYS BEFORE ISSUING A FINAL NEGATIVE REPORT Performed at [facility]    Report Status PENDING  Incomplete     Studies: Dg Chest 2 View  12/07/2014   CLINICAL DATA:  54 year old male with cough, fever and altered mental status with hallucinations  EXAM: CHEST  2 VIEW  COMPARISON:  Prior chest x-ray 09/21/2013  FINDINGS: Stable cardiac and mediastinal contours. Low inspiratory volumes which results in progressed appearance of central airway thickening and  interstitial prominence. No focal airspace consolidation, pleural effusion or pneumothorax. The lateral view appears to be an expiration. Incompletely imaged anterior cervical fusion hardware. No acute osseous abnormality.  IMPRESSION: Low inspiratory volumes. Otherwise, no acute cardiopulmonary process.   Electronically Signed   By: Malachy Moan M.D.   On: 12/07/2014 01:25   Ct Head Wo Contrast  12/07/2014   CLINICAL DATA:  Acute confusion and altered mental status. Hallucinations.  EXAM: CT HEAD WITHOUT CONTRAST  TECHNIQUE: Contiguous axial images were obtained from the base of the skull through the vertex without intravenous contrast.  COMPARISON:  12/07/2014  FINDINGS: Mild chronic small-vessel white matter ischemic changes are again noted.  No acute intracranial abnormalities are identified, including mass lesion or mass effect, hydrocephalus, extra-axial fluid collection, midline shift, hemorrhage, or acute infarction.  The visualized bony calvarium is unremarkable.  IMPRESSION: No evidence of acute intracranial abnormality.  Mild chronic small-vessel white matter ischemic changes.   Electronically Signed   By: Harmon Pier M.D.   On: 12/07/2014 04:32   Ct Soft Tissue Neck W Contrast  12/07/2014   CLINICAL DATA:  Anterior fusion performed five days ago, now with fever, and neck pain. Assess for infection.  EXAM: CT NECK WITH CONTRAST  TECHNIQUE: Multidetector CT imaging of the neck was performed using the standard protocol following the bolus administration of intravenous contrast.  CONTRAST:  75mL OMNIPAQUE IOHEXOL 300 MG/ML  SOLN  COMPARISON:  MRI of the cervical spine July 17, 2014  FINDINGS: Pharynx and larynx: Mass effect from retropharyngeal/prevertebral fluid collection as described below, though widely patent and otherwise unremarkable.  Salivary glands: Normal.  Thyroid: Normal.  Lymph nodes: Normal.  Vascular: Normal.  Limited intracranial: Normal.  Visualized orbits: Normal.  Mastoids  and visualized paranasal sinuses: Well aerated. Multiple mandible dental caries and periapical lucencies. Absent maxillary teeth.  Skeleton/soft tissues: Interval C3-4 thru C6-7 ACDF, intact plate, which is not apposed to the anterior C3 or C4 vertebral bodies by 4 mm. No acute fracture. Straightened cervical lordosis. Multilevel at least moderate neural foraminal narrowing incompletely imaged. Prevertebral/ retropharyngeal effusion with focal ovoid 16 x 7 mm fluid collection within LEFT longus coli muscle, contiguous with a crescentic 5.9 x 2.4 cm fluid collection within LEFT neck, wrapping posterior to the laryngeal cartilage, extending anteriorly within 1 cm of the skin surface. Evaluation of the fluid collection is somewhat limited by streak artifact from hardware.  Upper chest: Patchy ground-glass opacities partially imaged in RIGHT lung apex.  IMPRESSION: Recent C3-4 through C6-7 ACDF, upper aspect of the plate is not apposed to the anterior vertebral bodies. Superimposed LEFT longus coli /LEFT neck fluid collection  measuring up to 5.9 x 2.4 cm, which could reflect seroma, hematoma or possible early abscess, limited assessment due to streak artifact from hardware.  Partially imaged ground-glass opacities RIGHT lung apex may be infectious or inflammatory.   Electronically Signed   By: Awilda Metroourtnay  Bloomer   On: 12/07/2014 02:39    Scheduled Meds: . ceFEPime (MAXIPIME) IV  2 g Intravenous 3 times per day  . citalopram  40 mg Oral Daily  . gabapentin  600 mg Oral QID  . insulin aspart  0-15 Units Subcutaneous TID WC  . lisinopril  10 mg Oral Daily  . nicotine  21 mg Transdermal Daily  . sodium chloride  3 mL Intravenous Q12H  . vancomycin  1,000 mg Intravenous Q8H   Continuous Infusions:   Principal Problem:   Sepsis Active Problems:   Delirium   Retropharyngeal abscess   Cervical vertebral fusion   Hallucination   Diabetes mellitus   Mood disorder   Acute encephalopathy   Pyrexia   Neck  swelling    Time spent: 25 min    Captain Blucher  [facility] Pager [phone]. If 7PM-7AM, please contact night-coverage at [email or web address], password [email or web address] 12/08/2014, 6:36 PM  LOS: 1 day

## 2014-12-09 DIAGNOSIS — T8140XA Infection following a procedure, unspecified, initial encounter: Secondary | ICD-10-CM | POA: Insufficient documentation

## 2014-12-09 DIAGNOSIS — T814XXA Infection following a procedure, initial encounter: Secondary | ICD-10-CM

## 2014-12-09 LAB — GLUCOSE, CAPILLARY
GLUCOSE-CAPILLARY: 199 mg/dL — AB (ref 70–99)
Glucose-Capillary: 258 mg/dL — ABNORMAL HIGH (ref 70–99)

## 2014-12-09 LAB — VANCOMYCIN, TROUGH: VANCOMYCIN TR: 13.7 ug/mL (ref 10.0–20.0)

## 2014-12-09 MED ORDER — CLINDAMYCIN HCL 300 MG PO CAPS: 300.0000 mg | ORAL_CAPSULE | Freq: Three times a day (TID) | ORAL | Status: DC

## 2014-12-09 MED ORDER — METFORMIN HCL ER 500 MG PO TB24: 500.0000 mg | ORAL_TABLET | Freq: Two times a day (BID) | ORAL | Status: DC

## 2014-12-09 NOTE — Progress Notes (Signed)
ANTIBIOTIC CONSULT NOTE - Follow-up  Pharmacy Consult for vancomycin and cefepime Indication: r/o post-op CNS infection  No Known Allergies  Patient Measurements: Height: 6\' 3"  (190.5 cm) Weight: 236 lb 8.9 oz (107.3 kg) IBW/kg (Calculated) : 84.5  Vital Signs: Temp: 99.1 F (37.3 C) (04/28 0523) Temp Source: Oral (04/28 0523) BP: 109/69 mmHg (04/28 0523) Pulse Rate: 92 (04/28 0523)  Labs:  Recent Labs  12/06/14 2254 12/06/14 2323 12/07/14 1015  WBC 13.1*  --  12.3*  HGB 15.8 17.0 15.7  PLT 168  --  164  CREATININE  --  1.10 0.97   Estimated Creatinine Clearance: 113.9 mL/min (by C-G formula based on Cr of 0.97).  Assessment: 54 yo male continues on Vancomycin and Cefepime for r/o L neck abscess (s/p recent C3-C7 anterior fusion 4/13). Afeb. WBC down to 12.3. Neurosurgery thinks swelling in cervical wound could be localized hematoma. Noted plan to possibly d/c abx today and may d/c home with o/p f/u.  Vancomycin trough 13.7 mcg/ml on 1gm IV q8h. Slightly subtherapeutic but with likely d/c of antibiotics today, will not adjust.  Goal of Therapy:  Vancomycin trough level 15-20 mcg/ml  Plan:  Continue vancomycin 1000mg  IV Q8H Continue Cefepime 2g IV Q8H  Will f/u d/c of abx later today  Christoper Fabianaron Dreanna Kyllo, PharmD, BCPS Clinical pharmacist, pager [phone] 12/09/2014,11:05 AM

## 2014-12-09 NOTE — Progress Notes (Signed)
Inpatient Diabetes Program Recommendations  AACE/ADA: New Consensus Statement on Inpatient Glycemic Control (2013)  Target Ranges:  Prepandial:   less than 140 mg/dL      Peak postprandial:   less than 180 mg/dL (1-2 hours)      Critically ill patients:  140 - 180 mg/dL   Results for Cody CarrelUGGLE, Seabron K (MRN [id number]) as of 12/09/2014 10:07  Ref. Range 12/08/2014 07:20 12/08/2014 11:40 12/08/2014 15:53 12/08/2014 21:42  Glucose-Capillary Latest Ref Range: 70-99 mg/dL 782171 (H) 956241 (H) 213209 (H) 158 (H)   Diabetes history: DM2 Outpatient Diabetes medications: Metformin 500 mg daily, Victoza 1.2mg  daily Current orders for Inpatient glycemic control: Novolog 0-15 units TID with meals  Inpatient Diabetes Program Recommendations Insulin - Meal Coverage: Post prandial glucose consistently elevated. Please consider ordering Novolog 3 units TID with meals for meal coverage (in addition to Novolog correction).  Thanks, Orlando PennerMarie Jumana Paccione, RN, MSN, CCRN, CDE Diabetes Coordinator Inpatient Diabetes Program [phone] (Team Pager from 8am to 5pm) [phone] ([facility] office) [phone] [facility] office)

## 2014-12-09 NOTE — Discharge Summary (Signed)
Physician Discharge Summary  Cody Pena VZD:[id number] DOB: [birth year] DOA: 12/06/2014  PCP: Aida Puffer, MD  Admit date: 12/06/2014 Discharge date: 12/09/2014  Time spent: 30 minutes  Recommendations for Outpatient Follow-up:  1. Follow up with PCP IN 2 WEEKS.  2. Follow up with neuro surgery as recommended  Discharge Diagnoses:  Principal Problem:   Sepsis Active Problems:   Delirium   Retropharyngeal abscess   Cervical vertebral fusion   Hallucination   Diabetes mellitus   Mood disorder   Acute encephalopathy   Pyrexia   Neck swelling   Discharge Condition: improved  Diet recommendation: carb modified diet.   Filed Weights   12/07/14 0523 12/08/14 0500 12/09/14 0500  Weight: 113.399 kg (250 lb) 108.5 kg (239 lb 3.2 oz) 107.3 kg (236 lb 8.9 oz)    History of present illness:  Cody Pena is a 54 y.o. male with Past medical history of diabetes mellitus, hypertension, recent cervical fusion.  presented with complaints of confusion  Hospital Course:  Questionable sepsis from the abscess in the neck: its probably not an abscess but fluid collection post op. Discussed with neuro surgery , who recommended no need for intervention at this time as it is probably post op fluid collection.  He was started on broad spectrum antibiotics and later on transitioned to oral clindamycin to complete the course. Follow up with neuro surgery as recommended.    Diabetes mellitus: RESUME home meds.    Procedures:  Ct neck.   Consultations:  Neuro surgery.   Discharge Exam: Filed Vitals:   12/09/14 0523  BP: 109/69  Pulse: 92  Temp: 99.1 F (37.3 C)  Resp:     General: alert afebrile comfortable Cardiovascular: s1s2 Respiratory: ctab  Discharge Instructions   Discharge Instructions    Call MD for:  persistant nausea and vomiting    Complete by:  As directed      Call MD for:  redness, tenderness, or signs of infection (pain, swelling, redness, odor or  green/yellow discharge around incision site)    Complete by:  As directed      Call MD for:  severe uncontrolled pain    Complete by:  As directed      Call MD for:  temperature >100.4    Complete by:  As directed      Diet Carb Modified    Complete by:  As directed      Discharge instructions    Complete by:  As directed   Follow up with Dr Jeral Fruit in one to 2 weeks.          Current Discharge Medication List    START taking these medications   Details  clindamycin (CLEOCIN) 300 MG capsule Take 1 capsule (300 mg total) by mouth 3 (three) times daily. Qty: 12 capsule, Refills: 0      CONTINUE these medications which have CHANGED   Details  metFORMIN (GLUCOPHAGE-XR) 500 MG 24 hr tablet Take 1 tablet (500 mg total) by mouth 2 (two) times daily. Qty: 60 tablet, Refills: 0      CONTINUE these medications which have NOT CHANGED   Details  citalopram (CELEXA) 40 MG tablet Take 40 mg by mouth daily.    cyclobenzaprine (FLEXERIL) 10 MG tablet Take 10 mg by mouth 3 (three) times daily.    gabapentin (NEURONTIN) 600 MG tablet Take 600 mg by mouth 4 (four) times daily.    HYDROcodone-acetaminophen (NORCO) 10-325 MG per tablet Take 1 tablet by mouth 4 (  four) times daily.    Liraglutide (VICTOZA [place]) Inject 1.2 mg into the skin daily.    lisinopril (PRINIVIL,ZESTRIL) 10 MG tablet Take 10 mg by mouth daily.    oxyCODONE (ROXICODONE) 15 MG immediate release tablet Take 15 mg by mouth every 4 (four) hours as needed for pain.      STOP taking these medications     predniSONE (STERAPRED UNI-PAK 21 TAB) 10 MG (21) TBPK tablet        No Known Allergies    The results of significant diagnostics from this hospitalization (including imaging, microbiology, ancillary and laboratory) are listed below for reference.    Significant Diagnostic Studies: Dg Chest 2 View  12/07/2014   CLINICAL DATA:  54 year old male with cough, fever and altered mental status with hallucinations  EXAM:  CHEST  2 VIEW  COMPARISON:  Prior chest x-ray 09/21/2013  FINDINGS: Stable cardiac and mediastinal contours. Low inspiratory volumes which results in progressed appearance of central airway thickening and interstitial prominence. No focal airspace consolidation, pleural effusion or pneumothorax. The lateral view appears to be an expiration. Incompletely imaged anterior cervical fusion hardware. No acute osseous abnormality.  IMPRESSION: Low inspiratory volumes. Otherwise, no acute cardiopulmonary process.   Electronically Signed   By: Malachy MoanHeath  McCullough M.D.   On: 12/07/2014 01:25   Ct Head Wo Contrast  12/07/2014   CLINICAL DATA:  Acute confusion and altered mental status. Hallucinations.  EXAM: CT HEAD WITHOUT CONTRAST  TECHNIQUE: Contiguous axial images were obtained from the base of the skull through the vertex without intravenous contrast.  COMPARISON:  12/07/2014  FINDINGS: Mild chronic small-vessel white matter ischemic changes are again noted.  No acute intracranial abnormalities are identified, including mass lesion or mass effect, hydrocephalus, extra-axial fluid collection, midline shift, hemorrhage, or acute infarction.  The visualized bony calvarium is unremarkable.  IMPRESSION: No evidence of acute intracranial abnormality.  Mild chronic small-vessel white matter ischemic changes.   Electronically Signed   By: Harmon PierJeffrey  Hu M.D.   On: 12/07/2014 04:32   Ct Soft Tissue Neck W Contrast  12/07/2014   CLINICAL DATA:  Anterior fusion performed five days ago, now with fever, and neck pain. Assess for infection.  EXAM: CT NECK WITH CONTRAST  TECHNIQUE: Multidetector CT imaging of the neck was performed using the standard protocol following the bolus administration of intravenous contrast.  CONTRAST:  75mL OMNIPAQUE IOHEXOL 300 MG/ML  SOLN  COMPARISON:  MRI of the cervical spine July 17, 2014  FINDINGS: Pharynx and larynx: Mass effect from retropharyngeal/prevertebral fluid collection as described below,  though widely patent and otherwise unremarkable.  Salivary glands: Normal.  Thyroid: Normal.  Lymph nodes: Normal.  Vascular: Normal.  Limited intracranial: Normal.  Visualized orbits: Normal.  Mastoids and visualized paranasal sinuses: Well aerated. Multiple mandible dental caries and periapical lucencies. Absent maxillary teeth.  Skeleton/soft tissues: Interval C3-4 thru C6-7 ACDF, intact plate, which is not apposed to the anterior C3 or C4 vertebral bodies by 4 mm. No acute fracture. Straightened cervical lordosis. Multilevel at least moderate neural foraminal narrowing incompletely imaged. Prevertebral/ retropharyngeal effusion with focal ovoid 16 x 7 mm fluid collection within LEFT longus coli muscle, contiguous with a crescentic 5.9 x 2.4 cm fluid collection within LEFT neck, wrapping posterior to the laryngeal cartilage, extending anteriorly within 1 cm of the skin surface. Evaluation of the fluid collection is somewhat limited by streak artifact from hardware.  Upper chest: Patchy ground-glass opacities partially imaged in RIGHT lung apex.  IMPRESSION: Recent C3-4  through C6-7 ACDF, upper aspect of the plate is not apposed to the anterior vertebral bodies. Superimposed LEFT longus coli /LEFT neck fluid collection measuring up to 5.9 x 2.4 cm, which could reflect seroma, hematoma or possible early abscess, limited assessment due to streak artifact from hardware.  Partially imaged ground-glass opacities RIGHT lung apex may be infectious or inflammatory.   Electronically Signed   By: Awilda Metro   On: 12/07/2014 02:39    Microbiology: Recent Results (from the past 240 hour(s))  Blood culture (routine x 2)     Status: None (Preliminary result)   Collection Time: 12/07/14 12:45 AM  Result Value Ref Range Status   Specimen Description BLOOD RIGHT ANTECUBITAL  Final   Special Requests BOTTLES DRAWN AEROBIC AND ANAEROBIC 5CC EA  Final   Culture   Final           BLOOD CULTURE RECEIVED NO GROWTH TO  DATE CULTURE WILL BE HELD FOR 5 DAYS BEFORE ISSUING A FINAL NEGATIVE REPORT Performed at [facility]    Report Status PENDING  Incomplete  Blood culture (routine x 2)     Status: None (Preliminary result)   Collection Time: 12/07/14 12:57 AM  Result Value Ref Range Status   Specimen Description BLOOD LEFT ANTECUBITAL  Final   Special Requests BOTTLES DRAWN AEROBIC AND ANAEROBIC 10CC EA  Final   Culture   Final           BLOOD CULTURE RECEIVED NO GROWTH TO DATE CULTURE WILL BE HELD FOR 5 DAYS BEFORE ISSUING A FINAL NEGATIVE REPORT Performed at [facility]    Report Status PENDING  Incomplete     Labs: Basic Metabolic Panel:  Recent Labs Lab 12/06/14 2323 12/07/14 1015  NA 132* 135  K 4.5 4.2  CL 92* 99  CO2  --  27  GLUCOSE 222* 199*  BUN 15 9  CREATININE 1.10 0.97  CALCIUM  --  8.5  MG  --  2.0   Liver Function Tests: No results for input(s): AST, ALT, ALKPHOS, BILITOT, PROT, ALBUMIN in the last 168 hours. No results for input(s): LIPASE, AMYLASE in the last 168 hours. No results for input(s): AMMONIA in the last 168 hours. CBC:  Recent Labs Lab 12/06/14 2254 12/06/14 2323 12/07/14 1015  WBC 13.1*  --  12.3*  NEUTROABS 9.4*  --  8.9*  HGB 15.8 17.0 15.7  HCT 45.8 50.0 45.9  MCV 95.0  --  95.4  PLT 168  --  164   Cardiac Enzymes: No results for input(s): CKTOTAL, CKMB, CKMBINDEX, TROPONINI in the last 168 hours. BNP: BNP (last 3 results) No results for input(s): BNP in the last 8760 hours.  ProBNP (last 3 results) No results for input(s): PROBNP in the last 8760 hours.  CBG:  Recent Labs Lab 12/08/14 1140 12/08/14 1553 12/08/14 2142 12/09/14 0901 12/09/14 1248  GLUCAP 241* 209* 158* 199* 258*       Signed:  Aaryan Essman  [facility] 12/09/2014, 2:11 PM

## 2014-12-13 LAB — CULTURE, BLOOD (ROUTINE X 2)
Culture: NO GROWTH
Culture: NO GROWTH

## 2016-03-29 ENCOUNTER — Other Ambulatory Visit: Payer: Self-pay | Admitting: Neurosurgery

## 2016-03-29 DIAGNOSIS — M5412 Radiculopathy, cervical region: Secondary | ICD-10-CM

## 2016-03-29 DIAGNOSIS — M549 Dorsalgia, unspecified: Secondary | ICD-10-CM

## 2016-04-05 ENCOUNTER — Ambulatory Visit
Admission: RE | Admit: 2016-04-05 | Discharge: 2016-04-05 | Disposition: A | Discharge status: Home / Self Care | Payer: 59 | Source: Ambulatory Visit | Attending: Neurosurgery | Admitting: Neurosurgery

## 2016-04-05 ENCOUNTER — Other Ambulatory Visit: Payer: 59

## 2016-04-05 VITALS — BP 106/68 | HR 79

## 2016-04-05 DIAGNOSIS — R221 Localized swelling, mass and lump, neck: Secondary | ICD-10-CM

## 2016-04-05 DIAGNOSIS — M549 Dorsalgia, unspecified: Secondary | ICD-10-CM

## 2016-04-05 DIAGNOSIS — M5412 Radiculopathy, cervical region: Secondary | ICD-10-CM

## 2016-04-05 DIAGNOSIS — M4322 Fusion of spine, cervical region: Secondary | ICD-10-CM

## 2016-04-05 IMAGING — CT CT L SPINE W/ CM
1 of 6 series · 5 of 14 positions shown, 7 images · non-contrast
Comparison: Lumbar MRI [DATE].

CLINICAL DATA: Previous cervical fusion. Low back and left leg
pain. Neck pain when looking downward. Difficulty swallowing.
TECHNIQUE: Contiguous axial images were obtained through the Cervical and
Lumbar spine after the intrathecal infusion of infusion. Coronal and
sagittal reconstructions were obtained of the axial image sets.

[Series 3: l spine soft · axial · 0.27mm/px · z∈[-356,-194]mm · 5 of 82 slices shown, 7 images]
[im 14/82  soft-tissue]
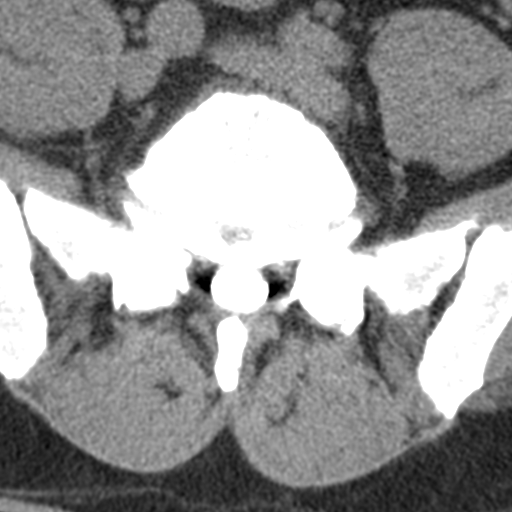
[im 14/82  bone]
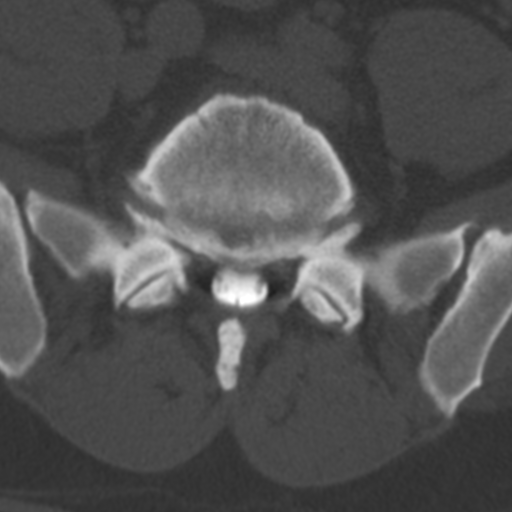
[im 28/82  bone]
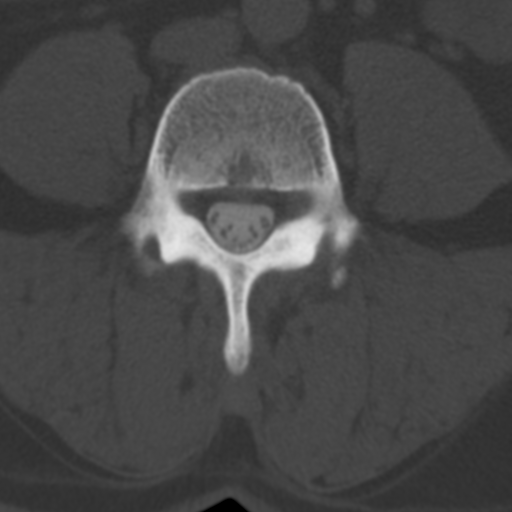
[im 41/82  bone]
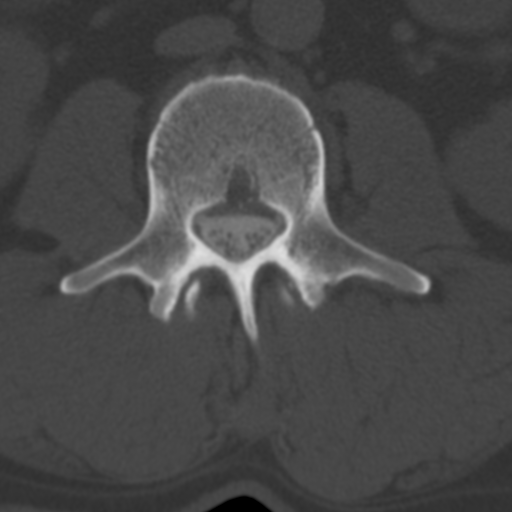
[im 55/82  bone]
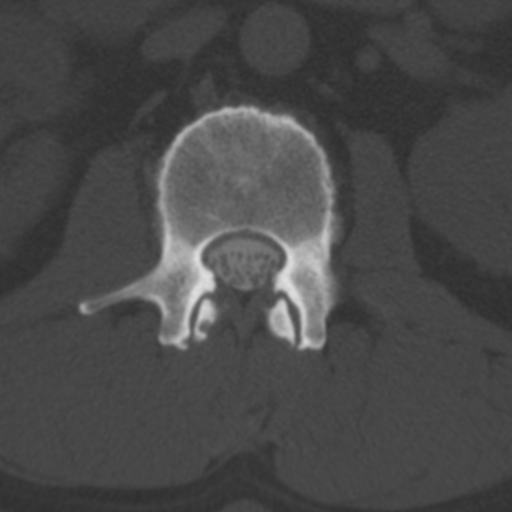
[im 68/82  soft-tissue]
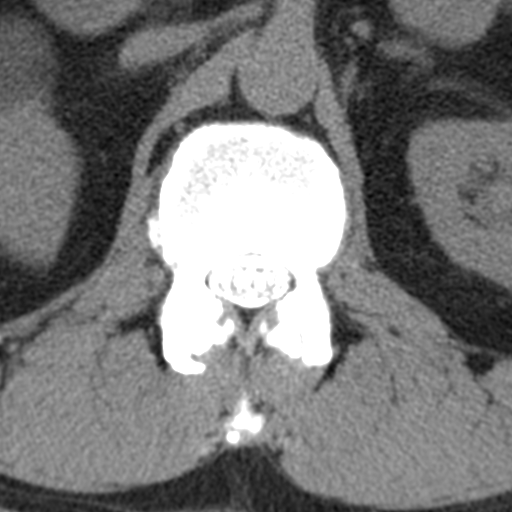
[im 68/82  bone]
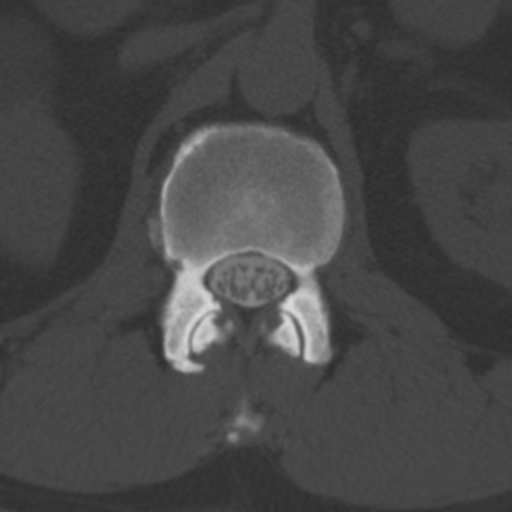

[5 of 14 positions shown; findings below may reference images not displayed]

FLUOROSCOPY TIME:  0 minutes 57 seconds. 215.38 micro gray meter
squared.

PROCEDURE:
LUMBAR PUNCTURE FOR CERVICAL AND LUMBAR MYELOGRAM

CERVICAL AND LUMBAR MYELOGRAM

CT CERVICAL MYELOGRAM

CT LUMBAR MYELOGRAM

After thorough discussion of risks and benefits of the procedure
including bleeding, infection, injury to nerves, blood vessels,
adjacent structures as well as headache and CSF leak, written and
oral informed consent was obtained. Consent was obtained by Dr. ARIANNA
ARIANNA.

Patient was positioned prone on the fluoroscopy table. Local
anesthesia was provided with 1% lidocaine without epinephrine after
prepped and draped in the usual sterile fashion. Puncture was
performed at L3-4 using a 3 1/2 inch 22-gauge spinal needle via left
para median approach. Using a single pass through the dura, the
needle was placed within the thecal sac, with return of clear CSF.
10 mL of [7S] was injected into the thecal sac, with normal
opacification of the nerve roots and cauda equina consistent with
free flow within the subarachnoid space. The patient was then moved
to the trendelenburg position and contrast flowed into the Cervical
spine region.

I personally performed the lumbar puncture and administered the
intrathecal contrast. I also personally performed acquisition of the
myelogram images.
Previous cervical imaging
including neck CT [DATE], C-arm image 413 [7S], MRI [DATE],
C-spine radiography [DATE] and [DATE].
FINDINGS: CERVICAL AND LUMBAR MYELOGRAM FINDINGS:

In the lumbar region, there is narrowing of both lateral recesses at
L4-5. More mass effect upon the left L5 nerve root than the right.
Small anterior extradural defects at L2-3 and L3-4. Mild lateral
recess narrowing at L2-3. At L5-S1, there is disc space narrowing.
There is 4 mm of anterolisthesis at does not change with flexion or
extension. Poor filling of the L5 and S1 root sleeves.

In the cervical region, there has been previous ACDF from C3 through
C7. Screws appear to have lost some purchase at C3. Interbody fusion
material at each level seems to show some collapse and/or
subsidence. There is narrowing of the spinal canal through the
fusion segment. Root sleeve filling in general seems diminished.

CT CERVICAL MYELOGRAM FINDINGS:

Foramen magnum is widely patent. There is ordinary osteoarthritis of
the C1-2 articulation.

C2-3: There is shallow protrusion of the disc that narrows the
ventral subarachnoid space but does not compress the cord. There is
facet arthropathy on the right. There is mild foraminal narrowing on
the right.

C3 through C7: Previous ACDF procedure. Compared to the immediate
postprocedural film, screws have 0 lost some purchase at the C3
level. Screws remain well seated at C4, C5 and C6. Screws at C7 are
at the inferior endplate/disc in surrounded by lucency. There does
not appear to be union across the disc spaces at C3-4, C4-5, C5-6 or
C6-7. The central canal is narrowed with effacement of the
subarachnoid space surrounding the cord but the cord is not
compressed. Narrowest level is at the inferior C4 where the AP
diameter measures only 6.8 mm. There is bony foraminal stenosis
bilaterally at C3-4, C4-5 and C5-6 and on the left at C6-7.

C7-T1: Bilateral facet arthropathy left worse than right with 2 mm
of anterolisthesis. No central canal stenosis. Mild foraminal
narrowing on the left because of osteophytic encroachment.

T1-2: Endplate osteophytes and chronic disc herniation. Narrowing of
the ventral subarachnoid space but no compression of the cord. No
foraminal compromise.

CT LUMBAR MYELOGRAM FINDINGS:

T12-L1: Minimal bulging of the disc. Mild facet hypertrophy. No
stenosis.

L1-2: Mild bulging of the disc. Mild facet hypertrophy. Mild
foraminal narrowing on the left.

L2-3: Moderate circumferential bulging of the disc. Mild facet and
ligamentous hypertrophy. Mild stenosis of both lateral recesses and
foramina, left more than right.

L3-4: Circumferential bulging of the disc. Mild facet and
ligamentous hypertrophy. Mild stenosis of the lateral recesses and
foramina left more than right.

L4-5: Broad-based herniation of disc material. Bilateral facet and
ligamentous hypertrophy. Stenosis of both lateral recesses that
could cause neural compression on either or both sides. Foraminal
stenosis bilaterally that could compress either or both exiting L4
nerve roots.

L5-S1: Chronic disc degeneration with loss of disc height and vacuum
phenomenon. Endplate osteophytes covering protruding disc material.
Bilateral facet arthropathy with 4 mm of anterolisthesis. No central
canal stenosis. Subarticular lateral recess stenosis bilaterally
that could compress either or both S1 nerve roots. Foraminal
stenosis bilaterally that could compress either or both L5 nerve
roots.

Osteoarthritis of both sacroiliac joints.
IMPRESSION: Cervical region: Probable nonunion in the cervical region from C3
through C7. Collapse and/or subsidence of the interbody fusion bone.
Screws at C3 have lost some purchase and show some surrounding
lucency. Screws at C7 have eroded through the inferior endplate. The
spinal canal is narrowed through the fusion segment, narrowest at
the C4 level where it measures 6.8 mm. Bilateral foraminal stenosis
at C3-4, C4-5 and C5-6. Left foraminal stenosis at C6-7. Facet
arthropathy at C7-T1 left worse than right. Mild foraminal narrowing
on the left. Spondylosis and facet arthropathy at C2-3 more
pronounced on the right with right foraminal narrowing because of
encroachment by facet osteophytes.

Lumbar region: Disc bulges and facet degeneration at L1-2, L2-3 and
L3-4. Mild lateral recess and foraminal narrowing, most pronounced
at L2-3.

L4-5: Circumferential herniation of disc material. Bilateral facet
arthropathy. Stenosis of both lateral recesses and neural foramina
that could cause compression of the L4 and L5 nerve roots.

L5-S1: Bilateral facet arthropathy with 4 mm of anterolisthesis.
Chronic disc degeneration with endplate osteophytes covering
protruding disc material. Stenosis of the subarticular lateral
recesses and neural foramina that could compress either or both L5
and S1 nerve roots.

Findings appear similar to the MRI of [DATE], probably
slightly worsened in general.

## 2016-04-05 IMAGING — XA DG MYELOGRAPHY LUMBAR INJ MULTI REGION
6 of 20 series · 6 of 20 positions shown · non-contrast
Comparison: Lumbar MRI [DATE].

CLINICAL DATA: Previous cervical fusion. Low back and left leg
pain. Neck pain when looking downward. Difficulty swallowing.
TECHNIQUE: Contiguous axial images were obtained through the Cervical and
Lumbar spine after the intrathecal infusion of infusion. Coronal and
sagittal reconstructions were obtained of the axial image sets.

[Series 1: w cervical spine lat · 0.15mm/px · 1 of 1 slices shown]
[im 1/1]
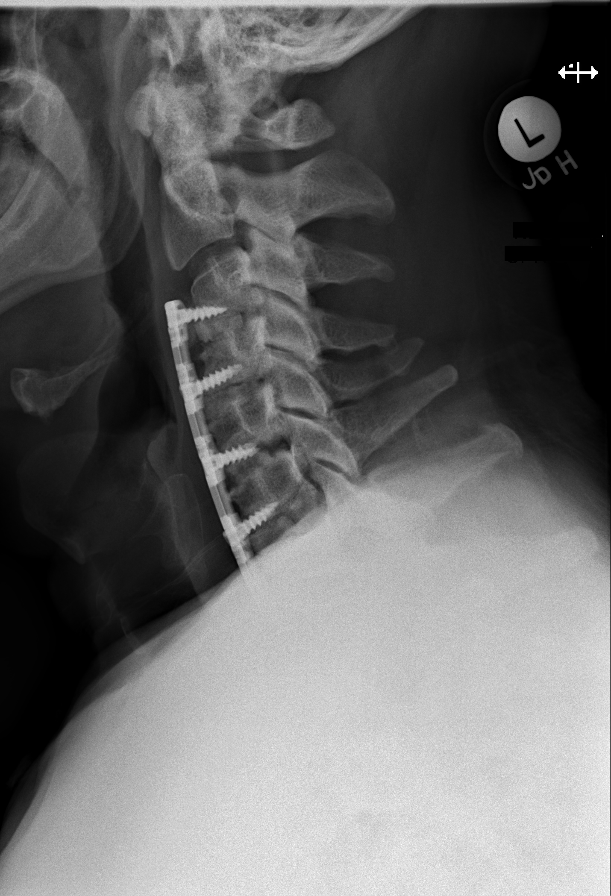

[Series 1: vasc adipose · 1 of 1 slices shown (1 of 3)]
[im 1/1]
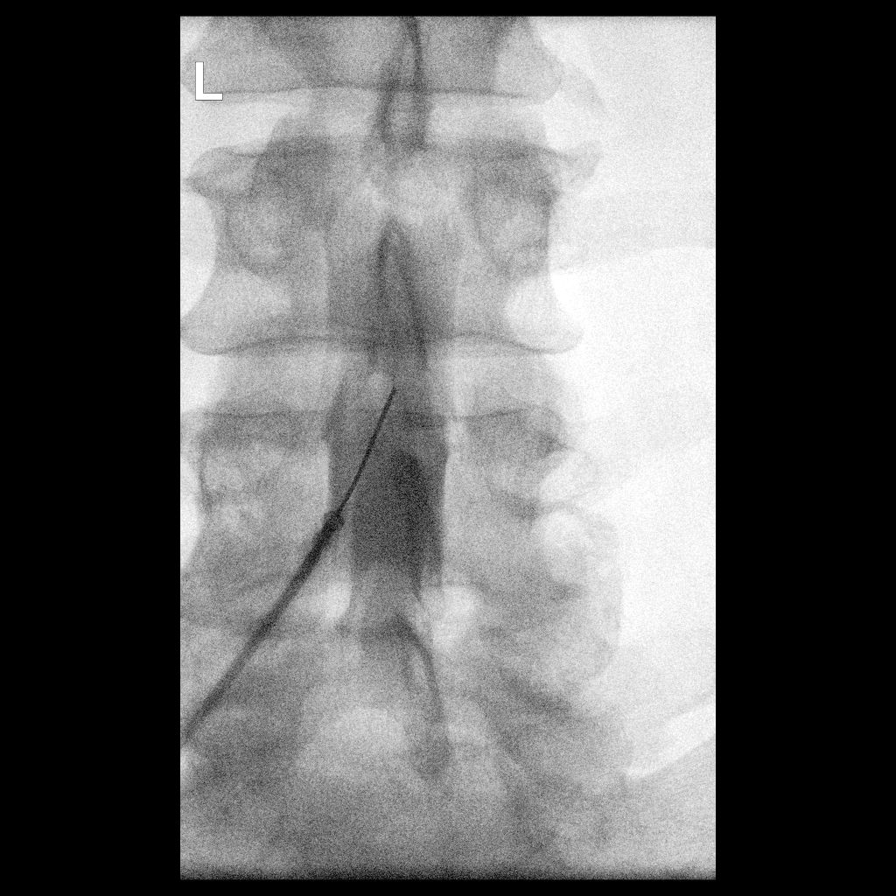

[Series 2: w cervical spine flexion · 0.15mm/px · 1 of 1 slices shown]
[im 1/1]
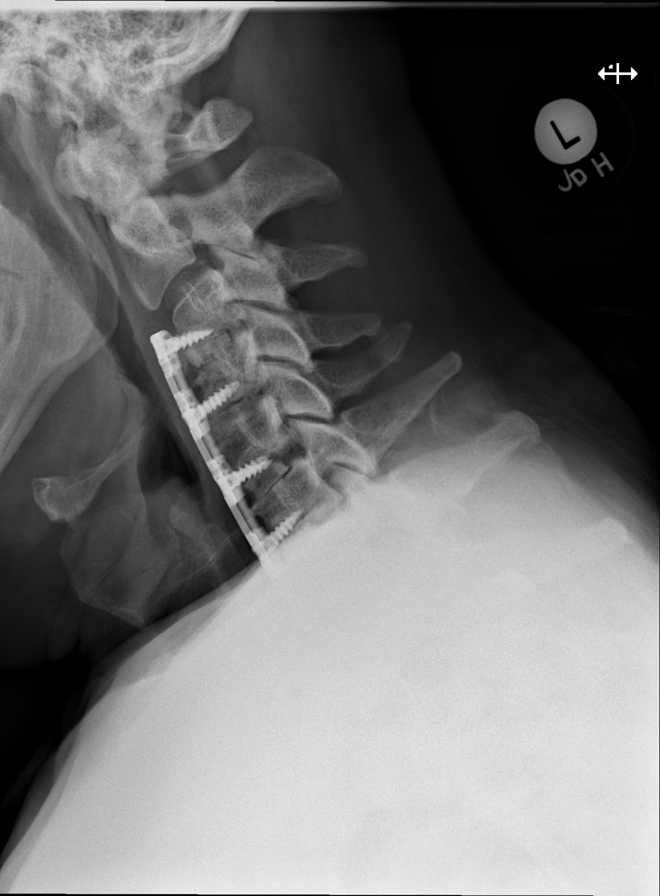

[Series 2: vasc adipose · 1 of 1 slices shown (2 of 3)]
[im 1/1]
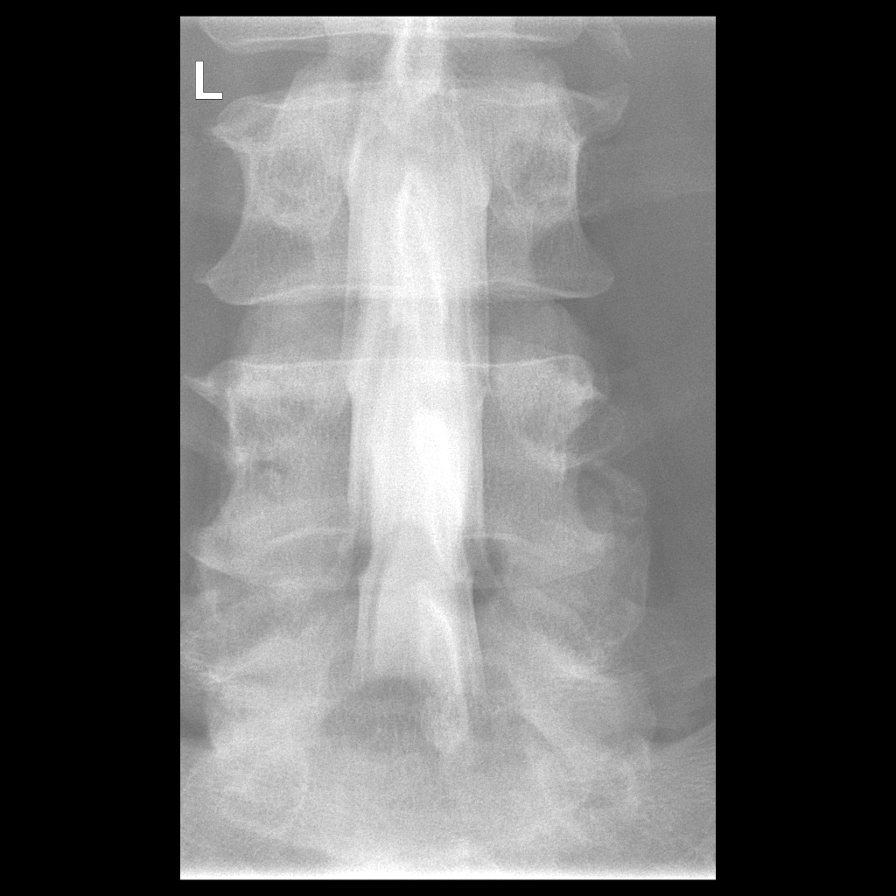

[Series 3: w cervical spine extension · 0.15mm/px · 1 of 1 slices shown]
[im 1/1]
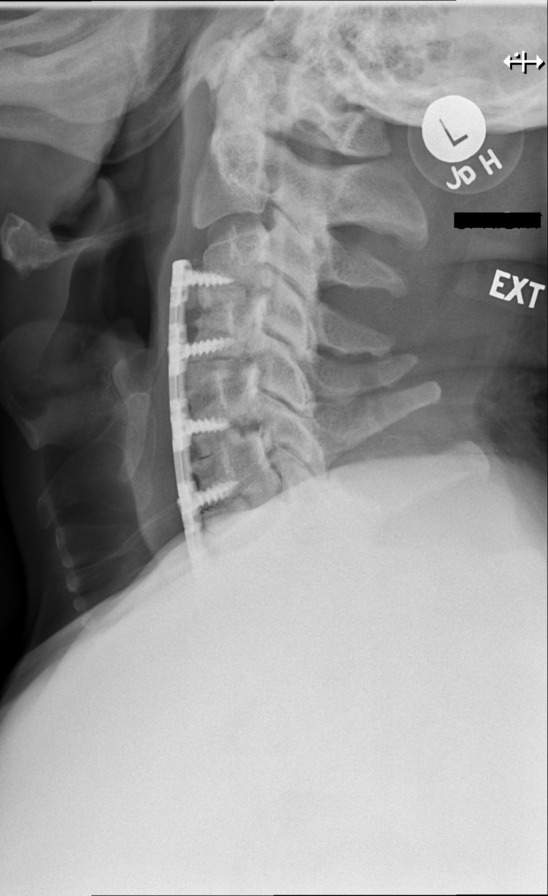

[Series 3: vasc adipose · 1 of 1 slices shown (3 of 3)]
[im 1/1]
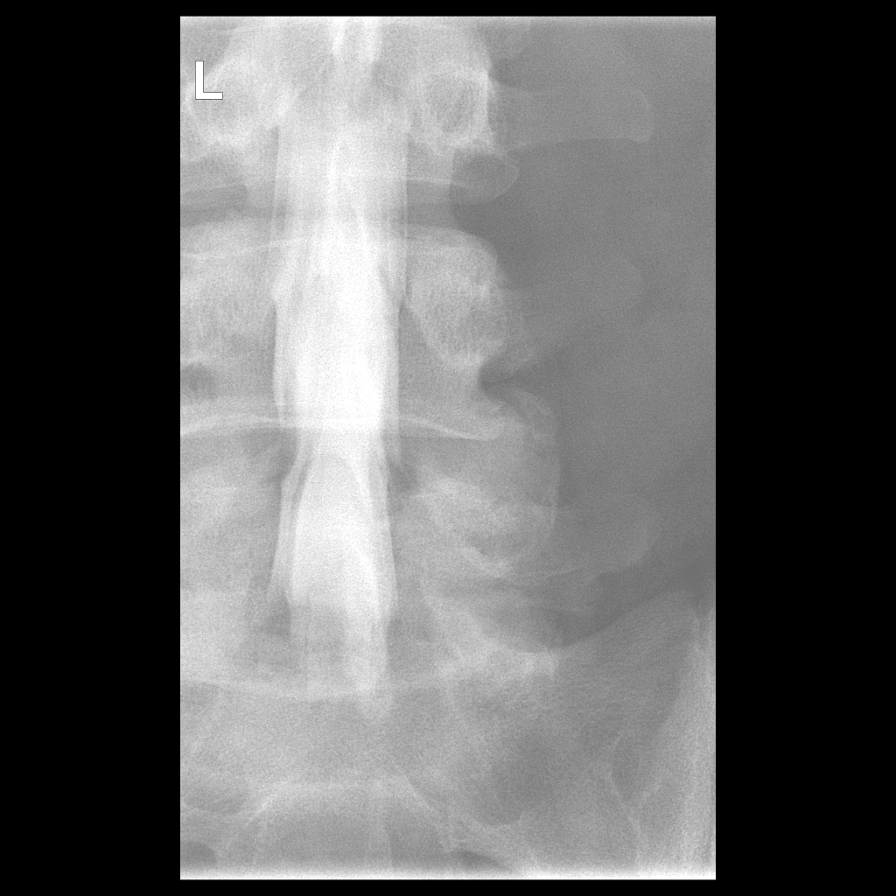

[6 of 20 positions shown; findings below may reference images not displayed]

FLUOROSCOPY TIME:  0 minutes 57 seconds. 215.38 micro gray meter
squared.

PROCEDURE:
LUMBAR PUNCTURE FOR CERVICAL AND LUMBAR MYELOGRAM

CERVICAL AND LUMBAR MYELOGRAM

CT CERVICAL MYELOGRAM

CT LUMBAR MYELOGRAM

After thorough discussion of risks and benefits of the procedure
including bleeding, infection, injury to nerves, blood vessels,
adjacent structures as well as headache and CSF leak, written and
oral informed consent was obtained. Consent was obtained by Dr. ARIANNA
ARIANNA.

Patient was positioned prone on the fluoroscopy table. Local
anesthesia was provided with 1% lidocaine without epinephrine after
prepped and draped in the usual sterile fashion. Puncture was
performed at L3-4 using a 3 1/2 inch 22-gauge spinal needle via left
para median approach. Using a single pass through the dura, the
needle was placed within the thecal sac, with return of clear CSF.
10 mL of [7S] was injected into the thecal sac, with normal
opacification of the nerve roots and cauda equina consistent with
free flow within the subarachnoid space. The patient was then moved
to the trendelenburg position and contrast flowed into the Cervical
spine region.

I personally performed the lumbar puncture and administered the
intrathecal contrast. I also personally performed acquisition of the
myelogram images.
Previous cervical imaging
including neck CT [DATE], C-arm image 413 [7S], MRI [DATE],
C-spine radiography [DATE] and [DATE].
FINDINGS: CERVICAL AND LUMBAR MYELOGRAM FINDINGS:

In the lumbar region, there is narrowing of both lateral recesses at
L4-5. More mass effect upon the left L5 nerve root than the right.
Small anterior extradural defects at L2-3 and L3-4. Mild lateral
recess narrowing at L2-3. At L5-S1, there is disc space narrowing.
There is 4 mm of anterolisthesis at does not change with flexion or
extension. Poor filling of the L5 and S1 root sleeves.

In the cervical region, there has been previous ACDF from C3 through
C7. Screws appear to have lost some purchase at C3. Interbody fusion
material at each level seems to show some collapse and/or
subsidence. There is narrowing of the spinal canal through the
fusion segment. Root sleeve filling in general seems diminished.

CT CERVICAL MYELOGRAM FINDINGS:

Foramen magnum is widely patent. There is ordinary osteoarthritis of
the C1-2 articulation.

C2-3: There is shallow protrusion of the disc that narrows the
ventral subarachnoid space but does not compress the cord. There is
facet arthropathy on the right. There is mild foraminal narrowing on
the right.

C3 through C7: Previous ACDF procedure. Compared to the immediate
postprocedural film, screws have 0 lost some purchase at the C3
level. Screws remain well seated at C4, C5 and C6. Screws at C7 are
at the inferior endplate/disc in surrounded by lucency. There does
not appear to be union across the disc spaces at C3-4, C4-5, C5-6 or
C6-7. The central canal is narrowed with effacement of the
subarachnoid space surrounding the cord but the cord is not
compressed. Narrowest level is at the inferior C4 where the AP
diameter measures only 6.8 mm. There is bony foraminal stenosis
bilaterally at C3-4, C4-5 and C5-6 and on the left at C6-7.

C7-T1: Bilateral facet arthropathy left worse than right with 2 mm
of anterolisthesis. No central canal stenosis. Mild foraminal
narrowing on the left because of osteophytic encroachment.

T1-2: Endplate osteophytes and chronic disc herniation. Narrowing of
the ventral subarachnoid space but no compression of the cord. No
foraminal compromise.

CT LUMBAR MYELOGRAM FINDINGS:

T12-L1: Minimal bulging of the disc. Mild facet hypertrophy. No
stenosis.

L1-2: Mild bulging of the disc. Mild facet hypertrophy. Mild
foraminal narrowing on the left.

L2-3: Moderate circumferential bulging of the disc. Mild facet and
ligamentous hypertrophy. Mild stenosis of both lateral recesses and
foramina, left more than right.

L3-4: Circumferential bulging of the disc. Mild facet and
ligamentous hypertrophy. Mild stenosis of the lateral recesses and
foramina left more than right.

L4-5: Broad-based herniation of disc material. Bilateral facet and
ligamentous hypertrophy. Stenosis of both lateral recesses that
could cause neural compression on either or both sides. Foraminal
stenosis bilaterally that could compress either or both exiting L4
nerve roots.

L5-S1: Chronic disc degeneration with loss of disc height and vacuum
phenomenon. Endplate osteophytes covering protruding disc material.
Bilateral facet arthropathy with 4 mm of anterolisthesis. No central
canal stenosis. Subarticular lateral recess stenosis bilaterally
that could compress either or both S1 nerve roots. Foraminal
stenosis bilaterally that could compress either or both L5 nerve
roots.

Osteoarthritis of both sacroiliac joints.
IMPRESSION: Cervical region: Probable nonunion in the cervical region from C3
through C7. Collapse and/or subsidence of the interbody fusion bone.
Screws at C3 have lost some purchase and show some surrounding
lucency. Screws at C7 have eroded through the inferior endplate. The
spinal canal is narrowed through the fusion segment, narrowest at
the C4 level where it measures 6.8 mm. Bilateral foraminal stenosis
at C3-4, C4-5 and C5-6. Left foraminal stenosis at C6-7. Facet
arthropathy at C7-T1 left worse than right. Mild foraminal narrowing
on the left. Spondylosis and facet arthropathy at C2-3 more
pronounced on the right with right foraminal narrowing because of
encroachment by facet osteophytes.

Lumbar region: Disc bulges and facet degeneration at L1-2, L2-3 and
L3-4. Mild lateral recess and foraminal narrowing, most pronounced
at L2-3.

L4-5: Circumferential herniation of disc material. Bilateral facet
arthropathy. Stenosis of both lateral recesses and neural foramina
that could cause compression of the L4 and L5 nerve roots.

L5-S1: Bilateral facet arthropathy with 4 mm of anterolisthesis.
Chronic disc degeneration with endplate osteophytes covering
protruding disc material. Stenosis of the subarticular lateral
recesses and neural foramina that could compress either or both L5
and S1 nerve roots.

Findings appear similar to the MRI of [DATE], probably
slightly worsened in general.

## 2016-04-05 IMAGING — CT CT CERVICAL SPINE W/ CM
3 series · 8 of 14 positions shown, 9 images · non-contrast
Comparison: Lumbar MRI [DATE].

CLINICAL DATA: Previous cervical fusion. Low back and left leg
pain. Neck pain when looking downward. Difficulty swallowing.
TECHNIQUE: Contiguous axial images were obtained through the Cervical and
Lumbar spine after the intrathecal infusion of infusion. Coronal and
sagittal reconstructions were obtained of the axial image sets.

[Series 2: cspine soft (person_name) · axial · 0.23mm/px · z∈[-206,-140]mm · 2 of 100 slices shown]
[im 34/100  soft-tissue]
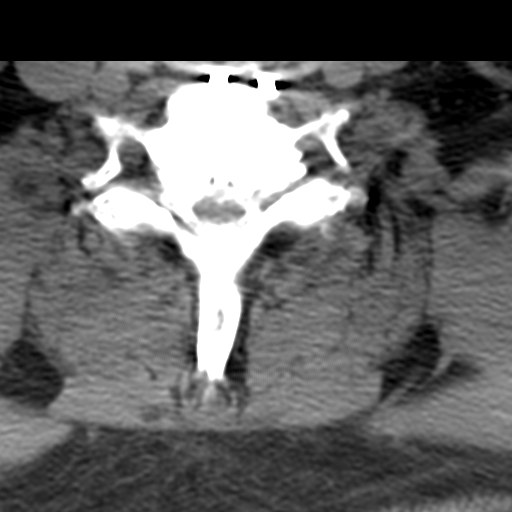
[im 67/100  soft-tissue]
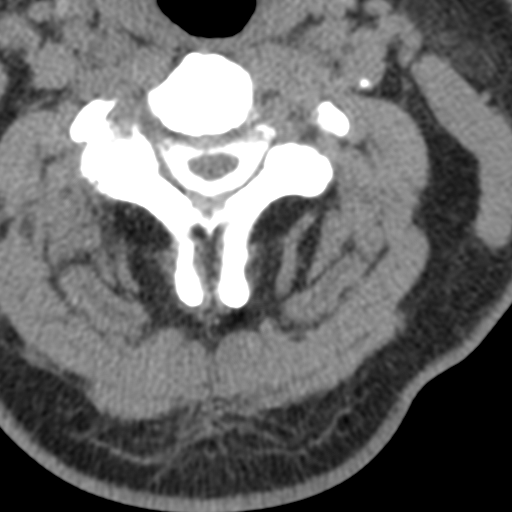

[Series 3: c spine bone (person_name) · axial · 0.23mm/px · z∈[-224,-124]mm · 3 of 100 slices shown, 4 images]
[im 25/100  soft-tissue]
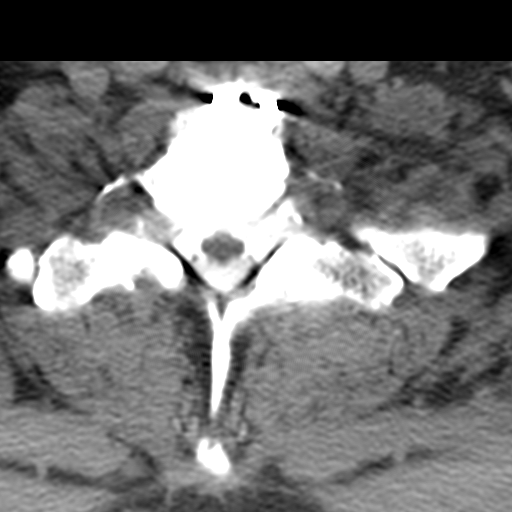
[im 25/100  bone]
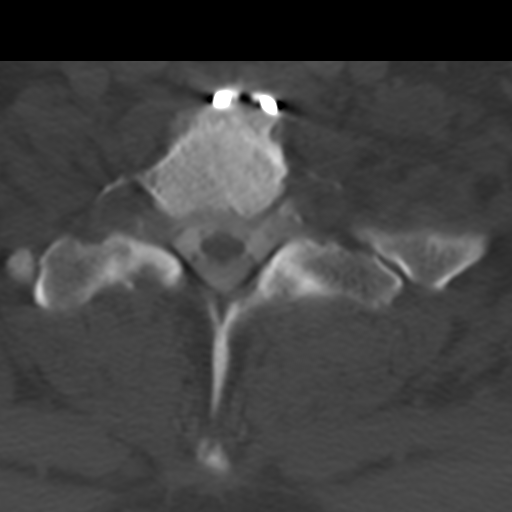
[im 50/100  bone]
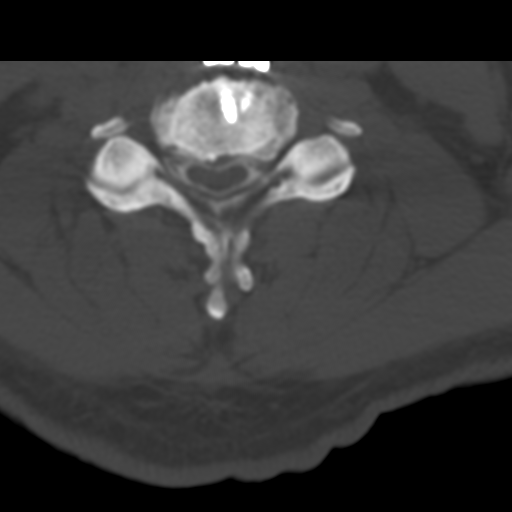
[im 75/100  bone]
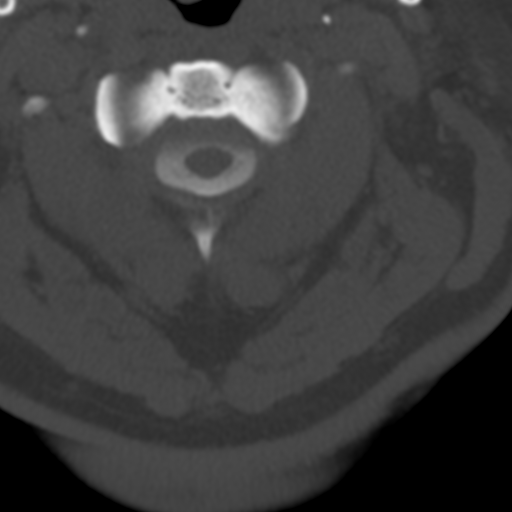

[Series 8: angled axial (person_name) · axial · 0.23mm/px · z∈[-237,-138]mm · 3 of 100 slices shown]
[im 25/100  bone]
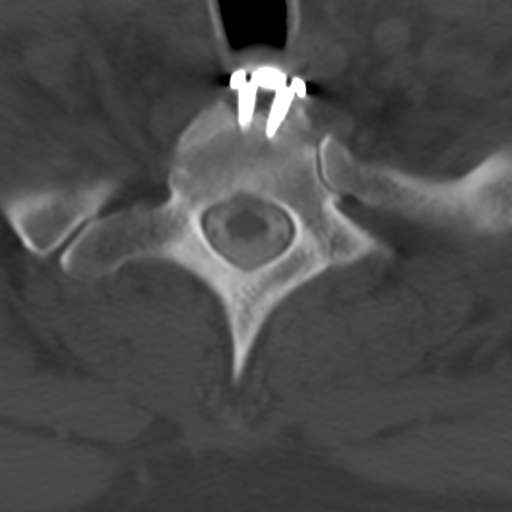
[im 50/100  bone]
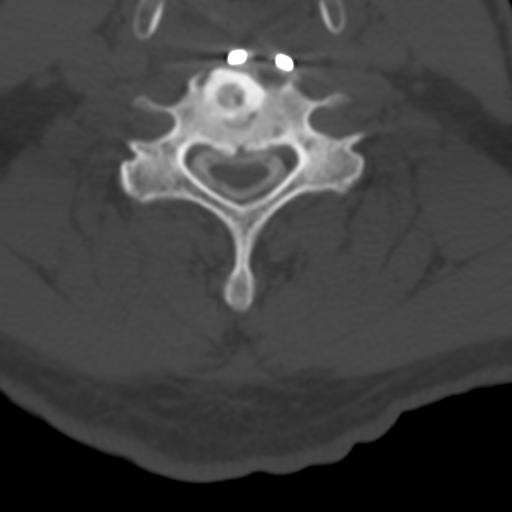
[im 75/100  bone]
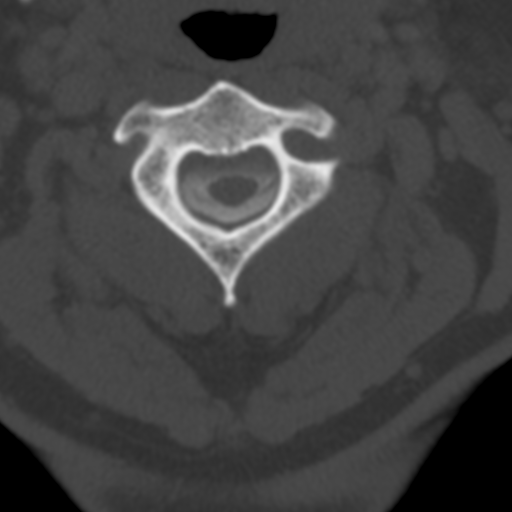

[8 of 14 positions shown; findings below may reference images not displayed]

FLUOROSCOPY TIME:  0 minutes 57 seconds. 215.38 micro gray meter
squared.

PROCEDURE:
LUMBAR PUNCTURE FOR CERVICAL AND LUMBAR MYELOGRAM

CERVICAL AND LUMBAR MYELOGRAM

CT CERVICAL MYELOGRAM

CT LUMBAR MYELOGRAM

After thorough discussion of risks and benefits of the procedure
including bleeding, infection, injury to nerves, blood vessels,
adjacent structures as well as headache and CSF leak, written and
oral informed consent was obtained. Consent was obtained by Dr. ARIANNA
ARIANNA.

Patient was positioned prone on the fluoroscopy table. Local
anesthesia was provided with 1% lidocaine without epinephrine after
prepped and draped in the usual sterile fashion. Puncture was
performed at L3-4 using a 3 1/2 inch 22-gauge spinal needle via left
para median approach. Using a single pass through the dura, the
needle was placed within the thecal sac, with return of clear CSF.
10 mL of [7S] was injected into the thecal sac, with normal
opacification of the nerve roots and cauda equina consistent with
free flow within the subarachnoid space. The patient was then moved
to the trendelenburg position and contrast flowed into the Cervical
spine region.

I personally performed the lumbar puncture and administered the
intrathecal contrast. I also personally performed acquisition of the
myelogram images.
Previous cervical imaging
including neck CT [DATE], C-arm image 413 [7S], MRI [DATE],
C-spine radiography [DATE] and [DATE].
FINDINGS: CERVICAL AND LUMBAR MYELOGRAM FINDINGS:

In the lumbar region, there is narrowing of both lateral recesses at
L4-5. More mass effect upon the left L5 nerve root than the right.
Small anterior extradural defects at L2-3 and L3-4. Mild lateral
recess narrowing at L2-3. At L5-S1, there is disc space narrowing.
There is 4 mm of anterolisthesis at does not change with flexion or
extension. Poor filling of the L5 and S1 root sleeves.

In the cervical region, there has been previous ACDF from C3 through
C7. Screws appear to have lost some purchase at C3. Interbody fusion
material at each level seems to show some collapse and/or
subsidence. There is narrowing of the spinal canal through the
fusion segment. Root sleeve filling in general seems diminished.

CT CERVICAL MYELOGRAM FINDINGS:

Foramen magnum is widely patent. There is ordinary osteoarthritis of
the C1-2 articulation.

C2-3: There is shallow protrusion of the disc that narrows the
ventral subarachnoid space but does not compress the cord. There is
facet arthropathy on the right. There is mild foraminal narrowing on
the right.

C3 through C7: Previous ACDF procedure. Compared to the immediate
postprocedural film, screws have 0 lost some purchase at the C3
level. Screws remain well seated at C4, C5 and C6. Screws at C7 are
at the inferior endplate/disc in surrounded by lucency. There does
not appear to be union across the disc spaces at C3-4, C4-5, C5-6 or
C6-7. The central canal is narrowed with effacement of the
subarachnoid space surrounding the cord but the cord is not
compressed. Narrowest level is at the inferior C4 where the AP
diameter measures only 6.8 mm. There is bony foraminal stenosis
bilaterally at C3-4, C4-5 and C5-6 and on the left at C6-7.

C7-T1: Bilateral facet arthropathy left worse than right with 2 mm
of anterolisthesis. No central canal stenosis. Mild foraminal
narrowing on the left because of osteophytic encroachment.

T1-2: Endplate osteophytes and chronic disc herniation. Narrowing of
the ventral subarachnoid space but no compression of the cord. No
foraminal compromise.

CT LUMBAR MYELOGRAM FINDINGS:

T12-L1: Minimal bulging of the disc. Mild facet hypertrophy. No
stenosis.

L1-2: Mild bulging of the disc. Mild facet hypertrophy. Mild
foraminal narrowing on the left.

L2-3: Moderate circumferential bulging of the disc. Mild facet and
ligamentous hypertrophy. Mild stenosis of both lateral recesses and
foramina, left more than right.

L3-4: Circumferential bulging of the disc. Mild facet and
ligamentous hypertrophy. Mild stenosis of the lateral recesses and
foramina left more than right.

L4-5: Broad-based herniation of disc material. Bilateral facet and
ligamentous hypertrophy. Stenosis of both lateral recesses that
could cause neural compression on either or both sides. Foraminal
stenosis bilaterally that could compress either or both exiting L4
nerve roots.

L5-S1: Chronic disc degeneration with loss of disc height and vacuum
phenomenon. Endplate osteophytes covering protruding disc material.
Bilateral facet arthropathy with 4 mm of anterolisthesis. No central
canal stenosis. Subarticular lateral recess stenosis bilaterally
that could compress either or both S1 nerve roots. Foraminal
stenosis bilaterally that could compress either or both L5 nerve
roots.

Osteoarthritis of both sacroiliac joints.
IMPRESSION: Cervical region: Probable nonunion in the cervical region from C3
through C7. Collapse and/or subsidence of the interbody fusion bone.
Screws at C3 have lost some purchase and show some surrounding
lucency. Screws at C7 have eroded through the inferior endplate. The
spinal canal is narrowed through the fusion segment, narrowest at
the C4 level where it measures 6.8 mm. Bilateral foraminal stenosis
at C3-4, C4-5 and C5-6. Left foraminal stenosis at C6-7. Facet
arthropathy at C7-T1 left worse than right. Mild foraminal narrowing
on the left. Spondylosis and facet arthropathy at C2-3 more
pronounced on the right with right foraminal narrowing because of
encroachment by facet osteophytes.

Lumbar region: Disc bulges and facet degeneration at L1-2, L2-3 and
L3-4. Mild lateral recess and foraminal narrowing, most pronounced
at L2-3.

L4-5: Circumferential herniation of disc material. Bilateral facet
arthropathy. Stenosis of both lateral recesses and neural foramina
that could cause compression of the L4 and L5 nerve roots.

L5-S1: Bilateral facet arthropathy with 4 mm of anterolisthesis.
Chronic disc degeneration with endplate osteophytes covering
protruding disc material. Stenosis of the subarticular lateral
recesses and neural foramina that could compress either or both L5
and S1 nerve roots.

Findings appear similar to the MRI of [DATE], probably
slightly worsened in general.

## 2016-04-05 MED ORDER — IOPAMIDOL (ISOVUE-M 300) INJECTION 61%
10.0000 mL | Freq: Once | INTRAMUSCULAR | Status: AC | PRN
  Administered 2016-04-05: 10 mL via INTRATHECAL

## 2016-04-05 MED ORDER — DIAZEPAM 5 MG PO TABS
10.0000 mg | ORAL_TABLET | Freq: Once | ORAL | Status: AC
  Administered 2016-04-05: 10 mg via ORAL

## 2016-04-05 NOTE — Discharge Instructions (Signed)
Myelogram Discharge Instructions  1. Go home and rest quietly for the next 24 hours.  It is important to lie flat for the next 24 hours.  Get up only to go to the restroom.  You may lie in the bed or on a couch on your back, your stomach, your left side or your right side.  You may have one pillow under your head.  You may have pillows between your knees while you are on your side or under your knees while you are on your back.  2. DO NOT drive today.  Recline the seat as far back as it will go, while still wearing your seat belt, on the way home.  3. You may get up to go to the bathroom as needed.  You may sit up for 10 minutes to eat.  You may resume your normal diet and medications unless otherwise indicated.  Drink lots of extra fluids today and tomorrow.  4. The incidence of headache, nausea, or vomiting is about 5% (one in 20 patients).  If you develop a headache, lie flat and drink plenty of fluids until the headache goes away.  Caffeinated beverages may be helpful.  If you develop severe nausea and vomiting or a headache that does not go away with flat bed rest, call [phone].  5. You may resume normal activities after your 24 hours of bed rest is over; however, do not exert yourself strongly or do any heavy lifting tomorrow. If when you get up you have a headache when standing, go back to bed and force fluids for another 24 hours.  6. Call your physician for a follow-up appointment.  The results of your myelogram will be sent directly to your physician by the following day.  7. If you have any questions or if complications develop after you arrive home, please call [phone].  Discharge instructions have been explained to the patient.  The patient, or the person responsible for the patient, fully understands these instructions.       May resume Celexa on Aug. 25, 2017, after 1:00 pm.

## 2016-04-05 NOTE — Progress Notes (Signed)
Pt states he has been off Celexa for the past 2 days. 

## 2016-04-10 ENCOUNTER — Telehealth: Payer: Self-pay

## 2016-04-10 NOTE — Telephone Encounter (Signed)
Left message, including call back number, for patient that I was checking in on him after his myelogram here 04/05/16.  Cody SievertJeanne Shikira Folino, RN

## 2016-04-25 ENCOUNTER — Other Ambulatory Visit: Payer: Self-pay | Admitting: Neurosurgery

## 2016-04-25 DIAGNOSIS — M48061 Spinal stenosis, lumbar region without neurogenic claudication: Secondary | ICD-10-CM

## 2016-05-01 ENCOUNTER — Ambulatory Visit
Admission: RE | Admit: 2016-05-01 | Discharge: 2016-05-01 | Disposition: A | Discharge status: Home / Self Care | Payer: 59 | Source: Ambulatory Visit | Attending: Neurosurgery | Admitting: Neurosurgery

## 2016-05-01 DIAGNOSIS — M48061 Spinal stenosis, lumbar region without neurogenic claudication: Secondary | ICD-10-CM

## 2016-05-01 IMAGING — XA Imaging study
2 series · 2 of 2 positions shown · non-contrast
Comparison: none

CLINICAL DATA: Lumbosacral spondylosis without myelopathy. Low back
pain. Intermittent left hip pain.

[Series 1: ortho standard · 1 of 1 slices shown (1 of 2)]
[im 1/1]
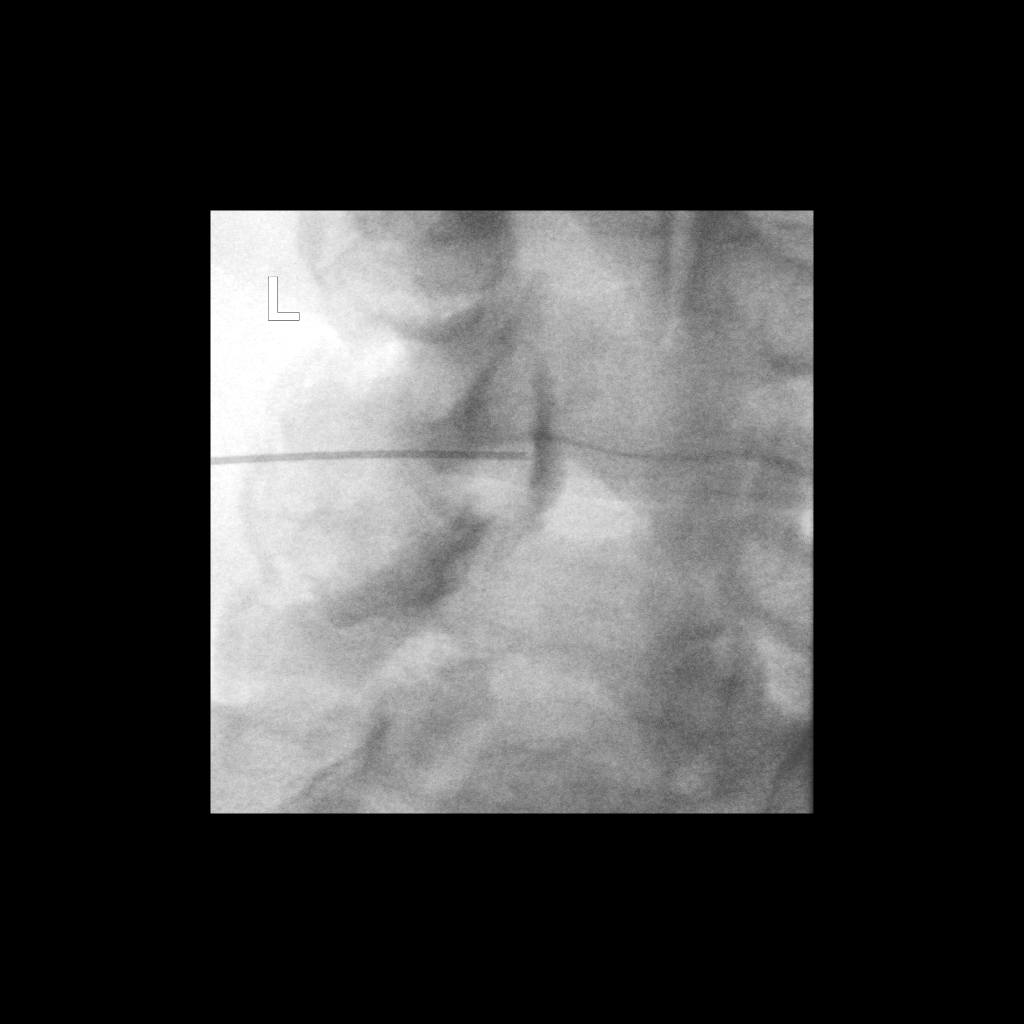

[Series 2: ortho standard · 1 of 1 slices shown (2 of 2)]
[im 1/1]
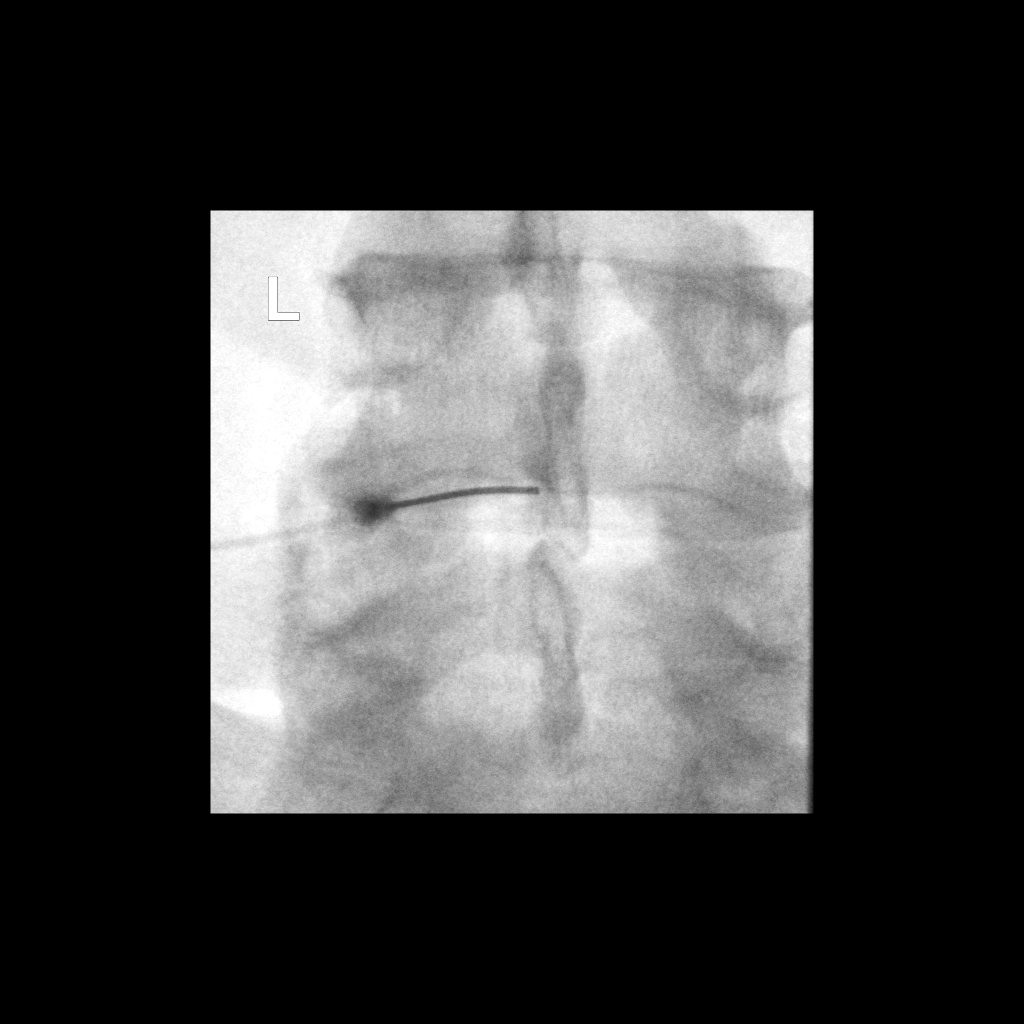

[2 of 2 positions shown; findings below may reference images not displayed]

FLUOROSCOPY TIME:  Radiation Exposure Index (as provided by the
fluoroscopic device): 21.65 microGray*m^2

Fluoroscopy Time (in minutes and seconds):  5 seconds

PROCEDURE:
The procedure, risks, benefits, and alternatives were explained to
the patient. Questions regarding the procedure were encouraged and
answered. The patient understands and consents to the procedure.

LUMBAR EPIDURAL INJECTION:

An interlaminar approach was performed on the left at L4-5. The
overlying skin was cleansed and anesthetized. A 3.5 inch 20 gauge
epidural needle was advanced using loss-of-resistance technique.

DIAGNOSTIC EPIDURAL INJECTION:

Injection of Isovue-M 200 shows a good epidural pattern with spread
above and below the level of needle placement, primarily on the
left. No vascular opacification is seen.

THERAPEUTIC EPIDURAL INJECTION:

120 mg of Depo-Medrol mixed with 3 mL of 1% lidocaine were
instilled. The procedure was well-tolerated, and the patient was
discharged thirty minutes following the injection in good condition.

COMPLICATIONS:
None
IMPRESSION: Technically successful lumbar interlaminar epidural injection on the
left at L4-5.

## 2016-05-01 MED ORDER — METHYLPREDNISOLONE ACETATE 40 MG/ML INJ SUSP (RADIOLOG
120.0000 mg | Freq: Once | INTRAMUSCULAR | Status: AC
  Administered 2016-05-01: 120 mg via EPIDURAL

## 2016-05-01 MED ORDER — IOPAMIDOL (ISOVUE-M 200) INJECTION 41%
1.0000 mL | Freq: Once | INTRAMUSCULAR | Status: AC
  Administered 2016-05-01: 1 mL via EPIDURAL

## 2016-05-01 NOTE — Discharge Instructions (Signed)

## 2016-10-28 ENCOUNTER — Emergency Department (HOSPITAL_COMMUNITY): Payer: 59

## 2016-10-28 ENCOUNTER — Emergency Department (HOSPITAL_COMMUNITY)
Admission: EM | Admit: 2016-10-28 | Discharge: 2016-10-29 | Disposition: A | Discharge status: Home / Self Care | Payer: 59 | Attending: Emergency Medicine | Admitting: Emergency Medicine

## 2016-10-28 ENCOUNTER — Encounter (HOSPITAL_COMMUNITY): Payer: Self-pay

## 2016-10-28 DIAGNOSIS — I10 Essential (primary) hypertension: Secondary | ICD-10-CM | POA: Diagnosis not present

## 2016-10-28 DIAGNOSIS — E119 Type 2 diabetes mellitus without complications: Secondary | ICD-10-CM | POA: Diagnosis not present

## 2016-10-28 DIAGNOSIS — Y999 Unspecified external cause status: Secondary | ICD-10-CM | POA: Insufficient documentation

## 2016-10-28 DIAGNOSIS — X58XXXA Exposure to other specified factors, initial encounter: Secondary | ICD-10-CM | POA: Diagnosis not present

## 2016-10-28 DIAGNOSIS — Y929 Unspecified place or not applicable: Secondary | ICD-10-CM | POA: Diagnosis not present

## 2016-10-28 DIAGNOSIS — S91332A Puncture wound without foreign body, left foot, initial encounter: Secondary | ICD-10-CM | POA: Diagnosis present

## 2016-10-28 DIAGNOSIS — Y939 Activity, unspecified: Secondary | ICD-10-CM | POA: Diagnosis not present

## 2016-10-28 DIAGNOSIS — F17200 Nicotine dependence, unspecified, uncomplicated: Secondary | ICD-10-CM | POA: Insufficient documentation

## 2016-10-28 LAB — CBG MONITORING, ED: GLUCOSE-CAPILLARY: 161 mg/dL — AB (ref 65–99)

## 2016-10-28 IMAGING — DX DG FOOT COMPLETE 3+V*L*
4 series · 4 of 4 positions shown · non-contrast
Comparison: None.

CLINICAL DATA: Puncture wound on plantar surface of foot - between
2nd and 3rd metatarsals, distally. Pt. Stepped on something at work
which came through boot - 1 week ago. Hx of diabetes.

EXAM:
LEFT FOOT - COMPLETE 3+ VIEW

[foot ap]
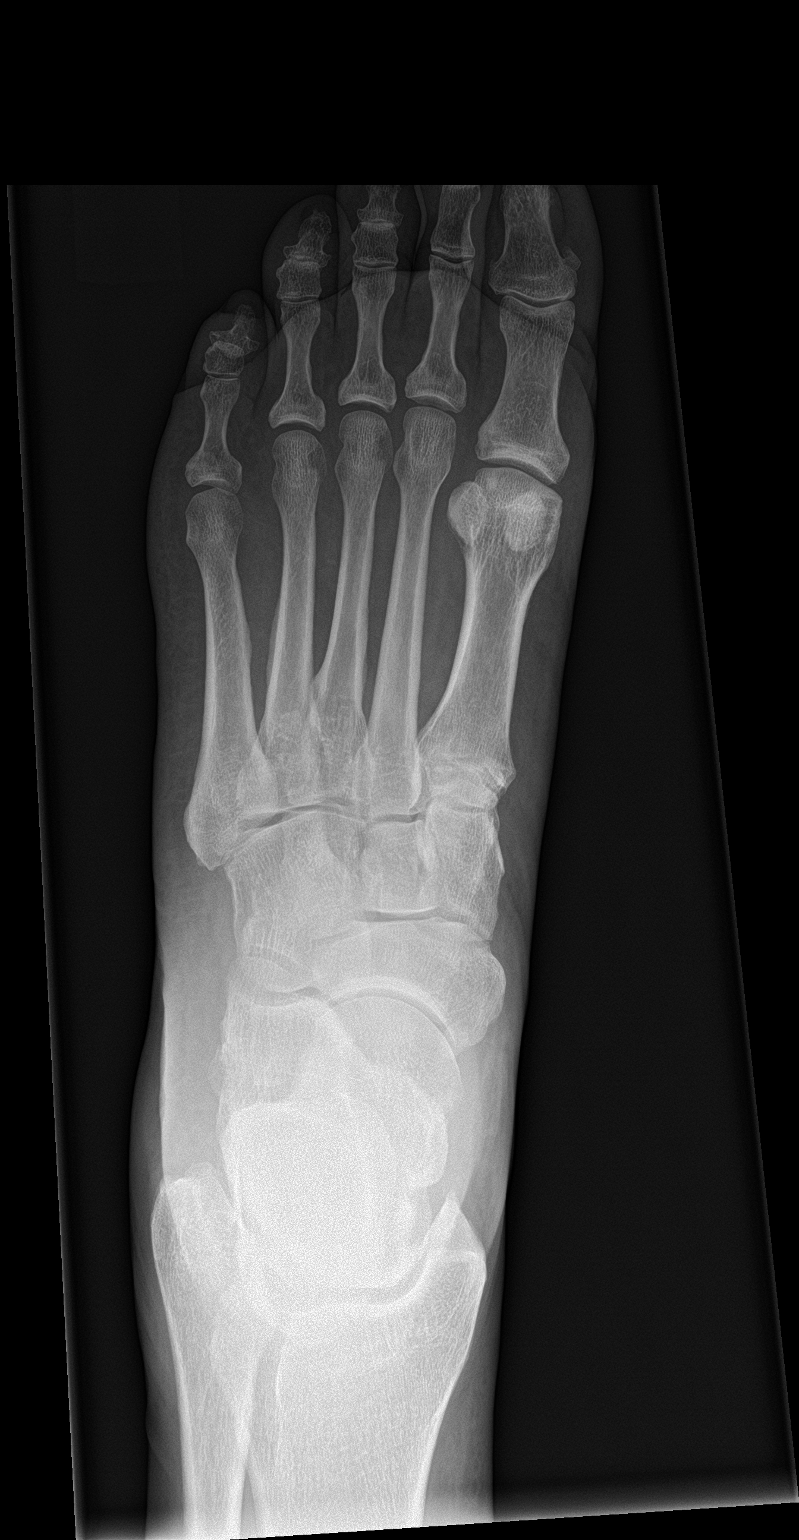

[foot obl]
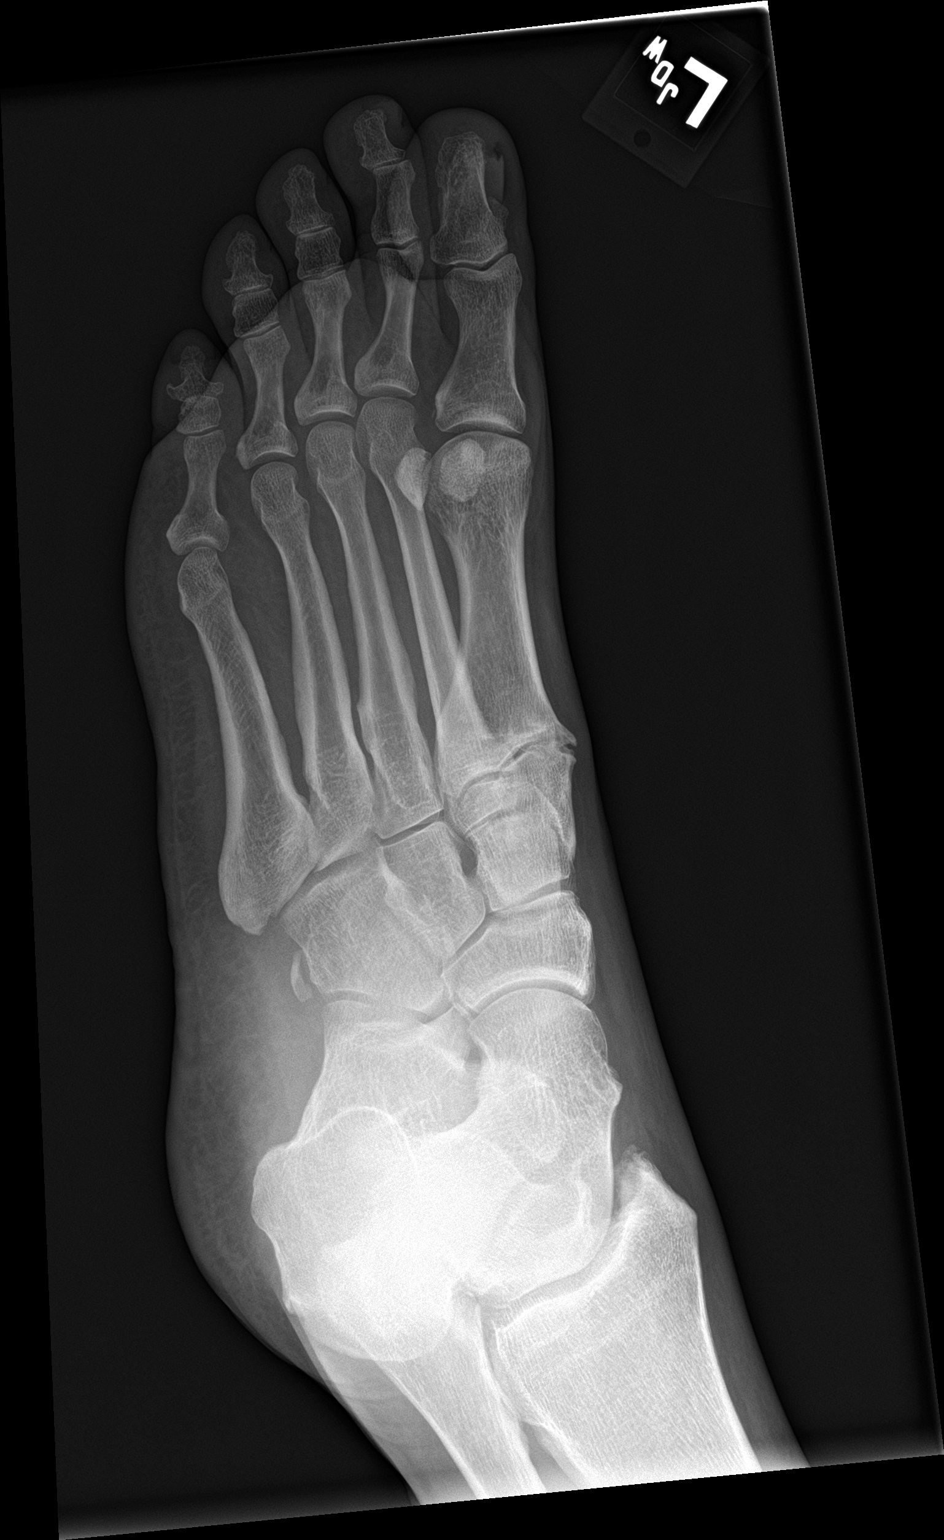

[foot lat (1 of 2)]
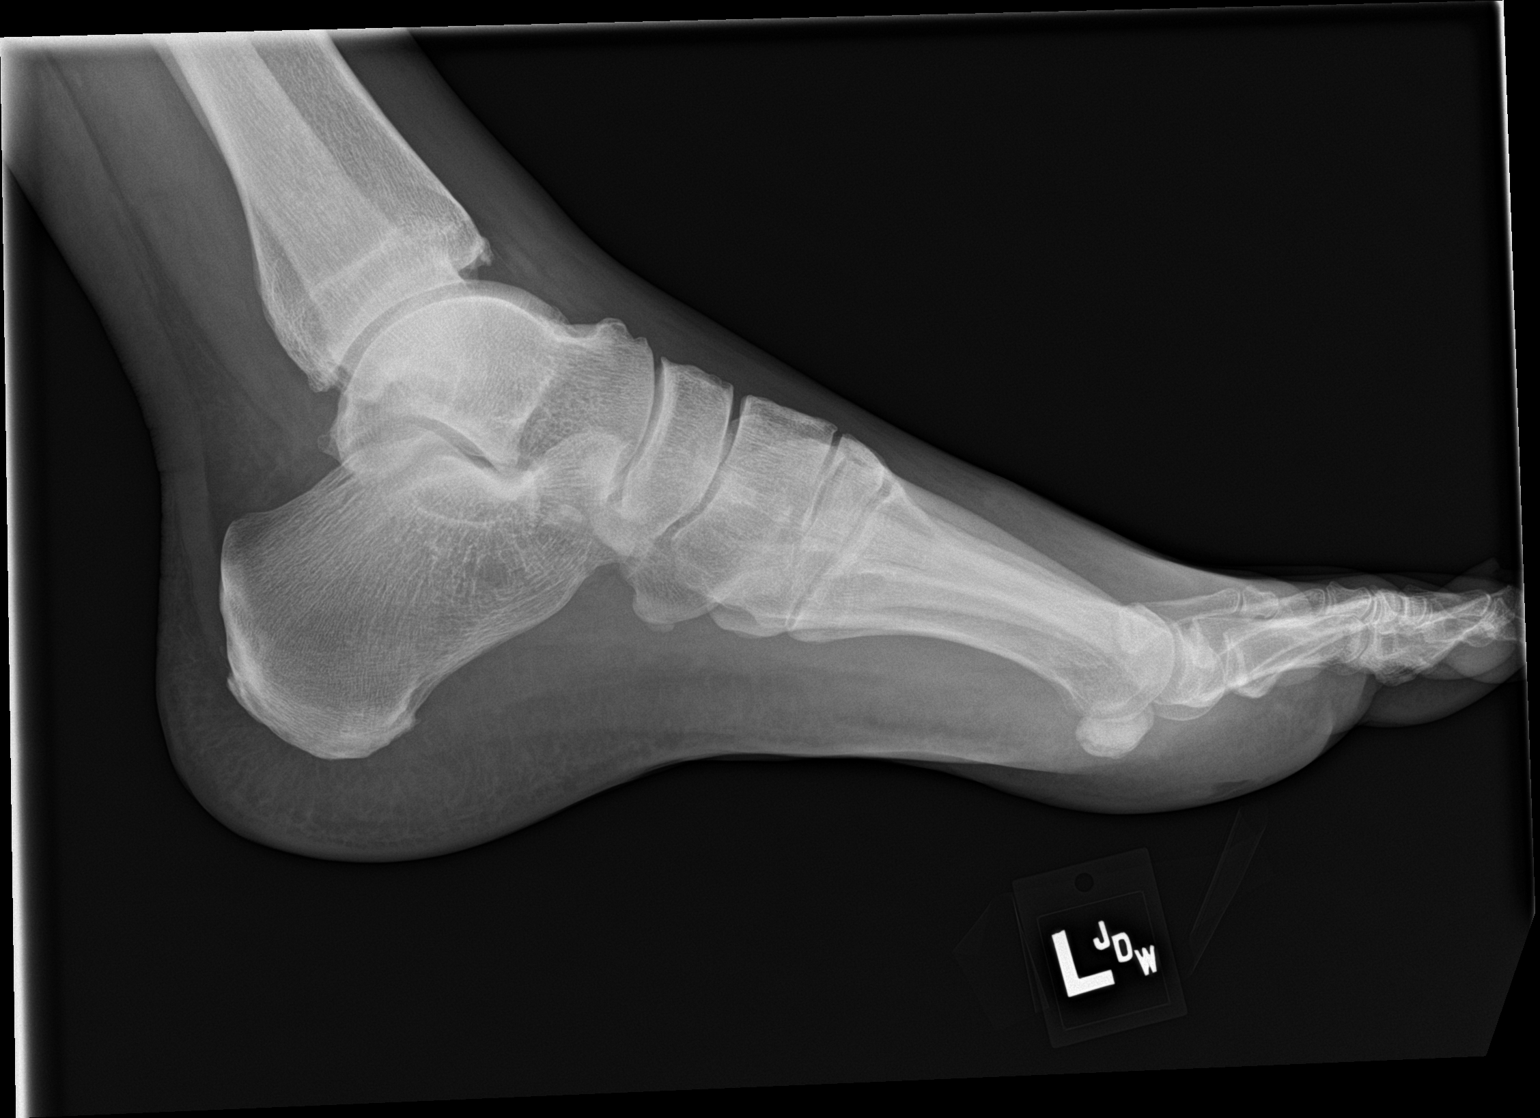

[foot lat (2 of 2)]
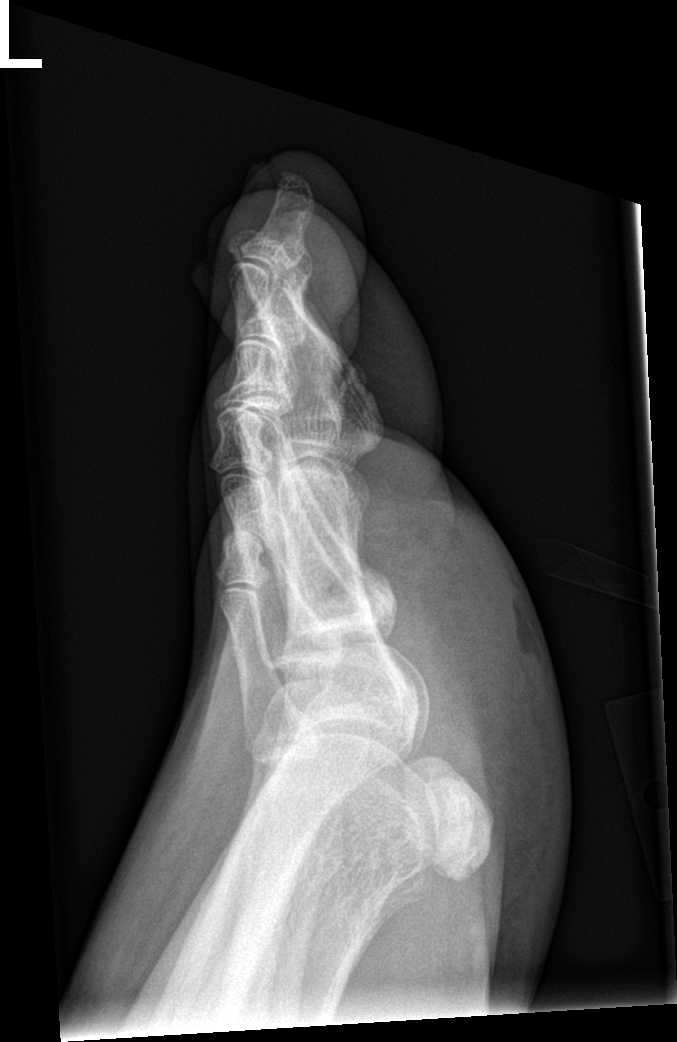

[4 of 4 positions shown; findings below may reference images not displayed]

FINDINGS: There is ulceration along the undersurface of the distal forefoot.
No evidence of associated osseous erosion. No subcutaneous gas.
IMPRESSION: Ulceration along plantar surface the foot. No evidence of
osteomyelitis.

## 2016-10-28 MED ORDER — TETANUS-DIPHTH-ACELL PERTUSSIS 5-2.5-18.5 LF-MCG/0.5 IM SUSP
0.5000 mL | Freq: Once | INTRAMUSCULAR | Status: AC
  Administered 2016-10-28: 0.5 mL via INTRAMUSCULAR
  Filled 2016-10-28: qty 0.5

## 2016-10-28 MED ORDER — LEVOFLOXACIN 750 MG PO TABS
750.0000 mg | ORAL_TABLET | Freq: Once | ORAL | Status: AC
  Administered 2016-10-28: 750 mg via ORAL
  Filled 2016-10-28: qty 1

## 2016-10-28 MED ORDER — CEPHALEXIN 250 MG PO CAPS
500.0000 mg | ORAL_CAPSULE | Freq: Once | ORAL | Status: AC
  Administered 2016-10-28: 500 mg via ORAL
  Filled 2016-10-28: qty 2

## 2016-10-28 MED ORDER — LEVOFLOXACIN 750 MG PO TABS: 750.0000 mg | ORAL_TABLET | Freq: Every day | ORAL | 0 refills | Status: AC

## 2016-10-28 MED ORDER — CEPHALEXIN 500 MG PO CAPS: 500.0000 mg | ORAL_CAPSULE | Freq: Two times a day (BID) | ORAL | 0 refills | Status: AC

## 2016-10-28 NOTE — ED Triage Notes (Signed)
Pt states hole in L foot. Pt states diabetic. Pt states some drainage. Pt states no hx of same.

## 2016-10-28 NOTE — ED Notes (Signed)
Pt understood dc material. NAD noted. Scripts given at dc 

## 2016-10-28 NOTE — ED Notes (Signed)
Patient transported to X-ray 

## 2016-10-28 NOTE — ED Provider Notes (Signed)
[facility]ER[facility]ENCY DEPT Provider Note   CSN: [id number] Arrival date & time: 10/28/16  2036     History   Chief Complaint Chief Complaint  Patient presents with  . Foot Pain    HPI Cody Pena is a 56 y.o. male.  HPI   Patient is a 56 year old male with history of diabetes, peripheral neuropathy and hypertension who presents to the ED with complaint of left foot wound. Patient reports 6 days ago he felt like he stepped on something while he was wearing boots at work. He notes 2 days later he noticed a small wound with a small hole to the bottom of his left foot. He notes over the past few days he has had mild intermittent pain and bloody/clear drainage from the wound. He also reports having associated swelling and redness to the top of his foot. Denies fever, chills, chest pain, shortness of breath, abdominal pain, vomiting, numbness, weakness. Denies any recent use of antibiotics. Tetanus status unknown.   Past Medical History:  Diagnosis Date  . Diabetes mellitus without complication (HCC)   . Hypertension     Patient Active Problem List   Diagnosis Date Noted  . Post op infection   . Delirium 12/07/2014  . Sepsis (HCC) 12/07/2014  . 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 Cervical vertebral fusion 12/07/2014  . Hallucination 12/07/2014  . Diabetes mellitus (HCC) 12/07/2014  . Mood disorder (HCC) 12/07/2014  . Acute encephalopathy   . Pyrexia   . Neck swelling     Past Surgical History:  Procedure Laterality Date  . CERVICAL FUSION         Home Medications    Prior to Admission medications   Medication Sig Start Date End Date Taking? Authorizing Provider  atorvastatin (LIPITOR) 80 MG tablet Take 80 mg by mouth daily.   Yes Historical Provider, MD  citalopram (CELEXA) 40 MG tablet Take 40 mg by mouth daily.   Yes Historical Provider, MD  diclofenac sodium (VOLTAREN) 1 % GEL Apply 1 application topically 4 (four) times daily. 09/19/16  Yes Historical Provider, MD    Dulaglutide (TRULICITY [place]) Inject 1 Device into the skin every 7 (seven) days.    Yes Historical Provider, MD  gabapentin (NEURONTIN) 600 MG tablet Take 600 mg by mouth 3 (three) times daily.    Yes Historical Provider, MD  HYDROcodone-acetaminophen (NORCO) 10-325 MG per tablet Take 1 tablet by mouth every 4 (four) hours as needed for moderate pain.    Yes Historical Provider, MD  lisinopril (PRINIVIL,ZESTRIL) 10 MG tablet Take 10 mg by mouth daily.   Yes Historical Provider, MD  Vitamin D, Ergocalciferol, (DRISDOL) 50000 units CAPS capsule Take 50,000 Units by mouth every 7 (seven) days.   Yes Historical Provider, MD  cephALEXin (KEFLEX) 500 MG capsule Take 1 capsule (500 mg total) by mouth 2 (two) times daily. 10/28/16 11/02/16  Barrett Henle, PA-C  levofloxacin (LEVAQUIN) 750 MG tablet Take 1 tablet (750 mg total) by mouth daily. 10/28/16 11/02/16  Barrett Henle, PA-C    Family History History reviewed. No pertinent family history.  Social History Social History  Substance Use Topics  . Smoking status: Current Every Day Smoker  . Smokeless tobacco: Never Used  . Alcohol use No     Allergies   Patient has no known allergies.   Review of Systems Review of Systems  Skin: Positive for color change (redness) and wound.  All other systems reviewed and are negative.    Physical Exam Updated  Vital Signs BP 124/72   Pulse 72   Temp 98.1 F (36.7 C) (Oral)   Resp 16   Ht 6\' 4"  (1.93 m)   Wt 108.9 kg   SpO2 95%   BMI 29.21 kg/m   Physical Exam  Constitutional: He is oriented to person, place, and time. He appears well-developed and well-nourished.  HENT:  Head: Normocephalic and atraumatic.  Eyes: Conjunctivae and EOM are normal. Right eye exhibits no discharge. Left eye exhibits no discharge. No scleral icterus.  Neck: Normal range of motion. Neck supple.  Cardiovascular: Normal rate, regular rhythm, normal heart sounds and intact distal pulses.    Pulmonary/Chest: Effort normal and breath sounds normal. No respiratory distress. He has no wheezes. He has no rales. He exhibits no tenderness.  Abdominal: Soft. There is no tenderness.  Musculoskeletal: Normal range of motion. He exhibits tenderness (mild TTP over left plantar and dorsal aspect of left foot). He exhibits no edema or deformity.  Mild swelling, erythema and warmth present to dorsal aspect of left distal forefoot with mild TTP. Area marked off.   FROM of left toes, foot, ankle and leg with 5/5 strength. Sensation grossly intact. 2+ DP pulses. Cap refill <2.  Neurological: He is alert and oriented to person, place, and time.  Skin: Skin is warm and dry.  1x2cm drained bulla present to dorsum of left with mild TTP, small puncture opening present at center. No surrounding swelling, erythema, warmth or drainage.   Nursing note and vitals reviewed.        ED Treatments / Results  Labs (all labs ordered are listed, but only abnormal results are displayed) Labs Reviewed  CBG MONITORING, ED - Abnormal; Notable for the following:       Result Value   Glucose-Capillary 161 (*)    All other components within normal limits    EKG  EKG Interpretation None       Radiology Dg Foot Complete Left  Result Date: 10/28/2016 CLINICAL DATA:  Puncture wound on plantar surface of foot - between 2nd and 3rd metatarsals, distally. Pt. Stepped on something at work which came through boot - 1 week ago. Hx of diabetes. EXAM: LEFT FOOT - COMPLETE 3+ VIEW COMPARISON:  None. FINDINGS: There is ulceration along the undersurface of the distal forefoot. No evidence of associated osseous erosion. No subcutaneous gas. IMPRESSION: Ulceration along plantar surface the foot. No evidence of osteomyelitis. Electronically Signed   By: Genevive Bi M.D.   On: 10/28/2016 23:07    Procedures Procedures (including critical care time)  Medications Ordered in ED Medications  levofloxacin (LEVAQUIN)  tablet 750 mg (not administered)  cephALEXin (KEFLEX) capsule 500 mg (not administered)  Tdap (BOOSTRIX) injection 0.5 mL (0.5 mLs Intramuscular Given 10/28/16 2157)     Initial Impression / Assessment and Plan / ED Course  I have reviewed the triage vital signs and the nursing notes.  Pertinent labs & imaging results that were available during my care of the patient were reviewed by me and considered in my medical decision making (see chart for details).    Patient presents with wound to bottom of left foot which she first noticed 5 days ago. Reports possibly stepping on something 7 days ago while at work wearing boots. Reports small amount of bloody clear drainage from wound. Denies fever. History of diabetes. Denies recent antibiotic use. VSS. Exam revealed small open bulla present to plantar aspect of left foot with no surrounding swelling, erythema or warmth. Mild swelling,  erythema and warmth present to the top of left foot. Left lower extremity neurovascularly intact. Tetanus updated in the ED. Left foot xray negative. Pt is without gross abscess for which I&D would be possible.  Area marked and pt encouraged to return if redness begins to streak, extends beyond the markings, and/or fever or nausea/vomiting develop.  Pt is alert, oriented, NAD, afebrile, non tachycardic, nonseptic and nontoxic appearing.  Pt to be d/c home on keflex and levofloxacin to cover for staph and pseudomonas due to suspected plantar puncture wound. Advised pt to followupw with PCP in 2-3 days for wound recheck. Discussed strict f/u instructions.      Final Clinical Impressions(s) / ED Diagnoses   Final diagnoses:  Penetrating wound of left foot, initial encounter    New Prescriptions New Prescriptions   CEPHALEXIN (KEFLEX) 500 MG CAPSULE    Take 1 capsule (500 mg total) by mouth 2 (two) times daily.   LEVOFLOXACIN (LEVAQUIN) 750 MG TABLET    Take 1 tablet (750 mg total) by mouth daily.     Satira Sarkicole Elizabeth  [place] Station[place]adeau, [place] JerseyPA-C 10/28/16 2347    Tilden FossaElizabeth Rees, MD 10/29/16 [phone]

## 2016-10-28 NOTE — Discharge Instructions (Signed)
Take your antibiotics as prescribed until completed. He may also take Tylenol or ibuprofen as prescribed over-the-counter as needed for pain relief. I recommend resting, elevating your foot to help with swelling. Follow-up with your primary care provider in 2-3 days for wound recheck. Return to the emergency department if symptoms worsen or new onset of fever, redness, swelling, warmth, drainage, numbness, weakness, vomiting, rash.

## 2017-02-28 ENCOUNTER — Other Ambulatory Visit: Payer: Self-pay

## 2017-02-28 DIAGNOSIS — I70291 Other atherosclerosis of native arteries of extremities, right leg: Secondary | ICD-10-CM

## 2017-05-01 ENCOUNTER — Encounter: Payer: 59 | Admitting: Vascular Surgery

## 2017-05-01 ENCOUNTER — Encounter (HOSPITAL_COMMUNITY): Payer: 59

## 2017-05-22 DIAGNOSIS — E119 Type 2 diabetes mellitus without complications: Secondary | ICD-10-CM

## 2017-05-22 DIAGNOSIS — E11621 Type 2 diabetes mellitus with foot ulcer: Secondary | ICD-10-CM | POA: Diagnosis not present

## 2017-05-22 DIAGNOSIS — L03119 Cellulitis of unspecified part of limb: Secondary | ICD-10-CM

## 2017-05-22 DIAGNOSIS — D72829 Elevated white blood cell count, unspecified: Secondary | ICD-10-CM | POA: Diagnosis not present

## 2017-05-23 DIAGNOSIS — L03119 Cellulitis of unspecified part of limb: Secondary | ICD-10-CM

## 2017-05-23 DIAGNOSIS — E119 Type 2 diabetes mellitus without complications: Secondary | ICD-10-CM | POA: Diagnosis not present

## 2017-05-23 DIAGNOSIS — E785 Hyperlipidemia, unspecified: Secondary | ICD-10-CM | POA: Diagnosis not present

## 2017-05-23 DIAGNOSIS — I10 Essential (primary) hypertension: Secondary | ICD-10-CM | POA: Diagnosis not present

## 2017-05-23 DIAGNOSIS — E1151 Type 2 diabetes mellitus with diabetic peripheral angiopathy without gangrene: Secondary | ICD-10-CM

## 2017-05-23 DIAGNOSIS — Z720 Tobacco use: Secondary | ICD-10-CM

## 2017-05-23 DIAGNOSIS — E11621 Type 2 diabetes mellitus with foot ulcer: Secondary | ICD-10-CM | POA: Diagnosis not present

## 2017-05-24 DIAGNOSIS — Z720 Tobacco use: Secondary | ICD-10-CM | POA: Diagnosis not present

## 2017-05-24 DIAGNOSIS — E1151 Type 2 diabetes mellitus with diabetic peripheral angiopathy without gangrene: Secondary | ICD-10-CM | POA: Diagnosis not present

## 2017-05-24 DIAGNOSIS — E11621 Type 2 diabetes mellitus with foot ulcer: Secondary | ICD-10-CM | POA: Diagnosis not present

## 2017-05-24 DIAGNOSIS — L03119 Cellulitis of unspecified part of limb: Secondary | ICD-10-CM | POA: Diagnosis not present

## 2017-05-25 DIAGNOSIS — E11621 Type 2 diabetes mellitus with foot ulcer: Secondary | ICD-10-CM | POA: Diagnosis not present

## 2017-05-25 DIAGNOSIS — M79605 Pain in left leg: Secondary | ICD-10-CM

## 2017-05-25 DIAGNOSIS — L03119 Cellulitis of unspecified part of limb: Secondary | ICD-10-CM | POA: Diagnosis not present

## 2017-05-25 DIAGNOSIS — Z720 Tobacco use: Secondary | ICD-10-CM | POA: Diagnosis not present

## 2017-05-25 DIAGNOSIS — E1151 Type 2 diabetes mellitus with diabetic peripheral angiopathy without gangrene: Secondary | ICD-10-CM | POA: Diagnosis not present

## 2017-06-06 ENCOUNTER — Encounter: Payer: Self-pay | Admitting: Sports Medicine

## 2017-06-06 ENCOUNTER — Ambulatory Visit (INDEPENDENT_AMBULATORY_CARE_PROVIDER_SITE_OTHER): Payer: 59 | Admitting: Sports Medicine

## 2017-06-06 VITALS — BP 120/67 | HR 88

## 2017-06-06 DIAGNOSIS — E1142 Type 2 diabetes mellitus with diabetic polyneuropathy: Secondary | ICD-10-CM

## 2017-06-06 DIAGNOSIS — L97522 Non-pressure chronic ulcer of other part of left foot with fat layer exposed: Secondary | ICD-10-CM

## 2017-06-06 DIAGNOSIS — M79672 Pain in left foot: Secondary | ICD-10-CM

## 2017-06-06 NOTE — Progress Notes (Signed)
   Subjective:    Patient ID: Cody CarrelRobert K Pena, male    DOB: 12-09-1960, 56 y.o.   MRN: [id number]  HPI    Review of Systems  HENT: Positive for tinnitus.   Cardiovascular: Positive for leg swelling.  Musculoskeletal: Positive for back pain.  Skin: Positive for color change and wound.  Neurological: Positive for numbness.  All other systems reviewed and are negative.      Objective:   Physical Exam        Assessment & Plan:

## 2017-06-06 NOTE — Progress Notes (Signed)
Subjective: Cody CarrelRobert K Pena is a 56 y.o. male patient seen in office for evaluation of ulceration of the left foot. Patient has a history of diabetes and a blood glucose level not recorded. Patient is changing the dressing using silver alginate at home and is getting PICC line antibiotics to finish 06/22/2017. Denies nausea/fever/vomiting/chills/night sweats/shortness of breath/pain. States that he still notices some swelling on the left foot. Patient has no other pedal complaints at this time.  Review of systems separate note.  Patient Active Problem List   Diagnosis Date Noted  . Post op infection   . Delirium 12/07/2014  . Sepsis (HCC) 12/07/2014  . 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 Cervical vertebral fusion 12/07/2014  . Hallucination 12/07/2014  . Diabetes mellitus (HCC) 12/07/2014  . Mood disorder (HCC) 12/07/2014  . Acute encephalopathy   . Pyrexia   . Neck swelling    Current Outpatient Prescriptions on File Prior to Visit  Medication Sig Dispense Refill  . atorvastatin (LIPITOR) 80 MG tablet Take 80 mg by mouth daily.    . citalopram (CELEXA) 40 MG tablet Take 40 mg by mouth daily.    Marland Kitchen. gabapentin (NEURONTIN) 600 MG tablet Take 600 mg by mouth 3 (three) times daily.     Marland Kitchen. HYDROcodone-acetaminophen (NORCO) 10-325 MG per tablet Take 1 tablet by mouth every 4 (four) hours as needed for moderate pain.     Marland Kitchen. lisinopril (PRINIVIL,ZESTRIL) 10 MG tablet Take 10 mg by mouth daily.    . diclofenac sodium (VOLTAREN) 1 % GEL Apply 1 application topically 4 (four) times daily.  0  . Dulaglutide (TRULICITY [place] ) Inject 1 Device into the skin every 7 (seven) days.     . Vitamin D, Ergocalciferol, (DRISDOL) 50000 units CAPS capsule Take 50,000 Units by mouth every 7 (seven) days.     No current facility-administered medications on file prior to visit.    No Known Allergies  No results found for this or any previous visit (from the past 2160 hour(s)).  Objective: There were no vitals  filed for this visit.  General: Patient is awake, alert, oriented x 3 and in no acute distress.  Dermatology: Skin is warm and dry bilateral with a full thickness ulceration present  Left plantar forefoot. Ulceration measures 2 cm x 1 cm x 0.3 cm. There is a  Keratotic border with a granular base. The ulceration does not  probe to bone. There is no malodor, no active drainage, faint erythema, focal edema. No other acute signs of infection.   Vascular: Dorsalis Pedis pulse = 1/4 Bilateral,  Posterior Tibial pulse = 1/4 Bilateral,  Capillary Fill Time < 5 seconds  Neurologic: Protective sensation diminished using the 5.07/10g Semmes Weinstein Monofilament.  Musculosketal: No Pain with palpation to ulcerated area. No pain with compression to calves bilateral.   No results for input(s): GRAMSTAIN, LABORGA in the last 8760 hours.  Assessment and Plan:  Problem List Items Addressed This Visit    None    Visit Diagnoses    Foot ulcer with fat layer exposed, left (HCC)    -  Primary   Diabetic polyneuropathy associated with type 2 diabetes mellitus (HCC)       Relevant Medications   Insulin Glargine (BASAGLAR KWIKPEN [place])   Left foot pain           -Examined patient and discussed the progression of the wound and treatment alternatives. - Excisionally dedbrided ulceration atLeft plantar forefoot to healthy bleeding borders removing nonviable tissue using a  sterile chisel blade. Wound measures post debridement as above. Wound was debrided to the level of the dermis with viable wound base exposed to promote healing. Hemostasis was achieved with manuel pressure. Patient tolerated procedure well without any discomfort or anesthesia necessary for this wound debridement.  -Applied Prisma and dry sterile dressing and instructed patient to continue with daily dressings at home consisting of silver alginate and dry sterile dressing. -Continue with forefoot offloading shoe -Continue with PICC line  antibiotics -Encouraged elevation when sitting to assist with edema control - Advised patient to go to the ER or return to office if the wound worsens or if constitutional symptoms are present. -Patient to return to office in 2 weeks for follow up care and evaluation or sooner if problems arise. May consider adding Regranex next visit.  Asencion Islam, DPM

## 2017-06-20 ENCOUNTER — Ambulatory Visit: Payer: 59 | Admitting: Sports Medicine

## 2017-06-20 ENCOUNTER — Encounter: Payer: Self-pay | Admitting: Sports Medicine

## 2017-06-20 DIAGNOSIS — L97522 Non-pressure chronic ulcer of other part of left foot with fat layer exposed: Secondary | ICD-10-CM

## 2017-06-20 DIAGNOSIS — E1142 Type 2 diabetes mellitus with diabetic polyneuropathy: Secondary | ICD-10-CM | POA: Diagnosis not present

## 2017-06-20 DIAGNOSIS — M79672 Pain in left foot: Secondary | ICD-10-CM

## 2017-06-20 NOTE — Progress Notes (Signed)
Subjective: Cody Pena is a 56 y.o. male patient seen in office for evaluation of ulceration of the left foot. Patient has a history of diabetes and a blood glucose level not recorded today, yesterday was 147. Patient is changing the dressing using silver alginate at home and is getting PICC line antibiotics to finish 06/22/2017, goes to hospital for infusions for cellulitis. Denies nausea/fever/vomiting/chills/night sweats/shortness of breath/pain. States that he feels like swelling and redness is better. Patient has no other pedal complaints at this time.  Patient Active Problem List   Diagnosis Date Noted  . Post op infection   . Delirium 12/07/2014  . Sepsis (HCC) 12/07/2014  . 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 Cervical vertebral fusion 12/07/2014  . Hallucination 12/07/2014  . Diabetes mellitus (HCC) 12/07/2014  . Mood disorder (HCC) 12/07/2014  . Acute encephalopathy   . Pyrexia   . Neck swelling    Current Outpatient Medications on File Prior to Visit  Medication Sig Dispense Refill  . atorvastatin (LIPITOR) 80 MG tablet Take 80 mg by mouth daily.    . citalopram (CELEXA) 40 MG tablet Take 40 mg by mouth daily.    . diclofenac sodium (VOLTAREN) 1 % GEL Apply 1 application topically 4 (four) times daily.  0  . Dulaglutide (TRULICITY [place]) Inject 1 Device into the skin every 7 (seven) days.     Marland Kitchen. gabapentin (NEURONTIN) 600 MG tablet Take 600 mg by mouth 3 (three) times daily.     Marland Kitchen. HYDROcodone-acetaminophen (NORCO) 10-325 MG per tablet Take 1 tablet by mouth every 4 (four) hours as needed for moderate pain.     . Insulin Glargine (BASAGLAR KWIKPEN [place]) Inject into the skin.    Marland Kitchen. lisinopril (PRINIVIL,ZESTRIL) 10 MG tablet Take 10 mg by mouth daily.    . Vitamin D, Ergocalciferol, (DRISDOL) 50000 units CAPS capsule Take 50,000 Units by mouth every 7 (seven) days.     No current facility-administered medications on file prior to visit.    No Known Allergies  No results found  for this or any previous visit (from the past 2160 hour(s)).  Objective: There were no vitals filed for this visit.  General: Patient is awake, alert, oriented x 3 and in no acute distress.  Dermatology: Skin is warm and dry bilateral with a full thickness ulceration present  Left plantar forefoot. Ulceration measures 2 cm x 1 cm x 0.3 cm. There is a  Keratotic border with a granular base. The ulceration does not  probe to bone. There is no malodor, no active drainage, faint erythema, focal edema. No other acute signs of infection.   Vascular: Dorsalis Pedis pulse = 1/4 Bilateral,  Posterior Tibial pulse = 1/4 Bilateral,  Capillary Fill Time < 5 seconds  Neurologic: Protective sensation diminished using the 5.07/10g Semmes Weinstein Monofilament.  Musculosketal: No Pain with palpation to ulcerated area. No pain with compression to calves bilateral.   No results for input(s): GRAMSTAIN, LABORGA in the last 8760 hours.  Assessment and Plan:  Problem List Items Addressed This Visit    None    Visit Diagnoses    Foot ulcer with fat layer exposed, left (HCC)    -  Primary   Diabetic polyneuropathy associated with type 2 diabetes mellitus (HCC)       Left foot pain          -Examined patient and discussed the progression of the wound and treatment alternatives. - Excisionally dedbrided ulceration at Left plantar forefoot to healthy bleeding  borders removing nonviable tissue using a sterile chisel blade. Wound measures post debridement as above. Wound was debrided to the level of the dermis with viable wound base exposed to promote healing. Hemostasis was achieved with manuel pressure. Patient tolerated procedure well without any discomfort or anesthesia necessary for this wound debridement.  -Applied Prisma and dry sterile dressing and instructed patient to continue with daily dressings at home consisting of silver alginate and dry sterile dressing until Regranex is received.  -Continue  with forefoot offloading shoe -Continue with PICC line antibiotics until 11-10 and then nursing may pull picc  -Encouraged elevation when sitting to assist with edema control -Continue with no work  - Advised patient to go to the ER or return to office if the wound worsens or if constitutional symptoms are present. -Patient to return to office in 2 weeks for follow up care and evaluation or sooner if problems arise.   Asencion Islamitorya Taelor Moncada, DPM

## 2017-06-26 ENCOUNTER — Telehealth: Payer: Self-pay | Admitting: Sports Medicine

## 2017-06-26 NOTE — Telephone Encounter (Signed)
Reviewed 06/20/2017 clinical note and Dr. Marylene LandStover stated remove PICC line after 06/22/2017. [facility]

## 2017-06-26 NOTE — Telephone Encounter (Signed)
Hey, [name]. Mr. Cody Pena is needing his PICC line removed. He stated when he saw her last week that she said it could be pulled but if we needed anything from her to give her a call. Yes, we do need an order saying to remove the PICC line. If you could call me back at [phone] or you can send me a fax with a signed order by Dr. Marylene LandStover to [phone]. Thank you.

## 2017-07-11 ENCOUNTER — Ambulatory Visit: Payer: 59 | Admitting: Sports Medicine

## 2017-11-08 ENCOUNTER — Emergency Department (HOSPITAL_COMMUNITY)
Admission: EM | Admit: 2017-11-08 | Discharge: 2017-11-09 | Discharge status: Against Medical Advice | Payer: 59 | Source: Home / Self Care | Attending: Emergency Medicine | Admitting: Emergency Medicine

## 2017-11-08 ENCOUNTER — Other Ambulatory Visit: Payer: Self-pay

## 2017-11-08 DIAGNOSIS — M79671 Pain in right foot: Secondary | ICD-10-CM | POA: Insufficient documentation

## 2017-11-08 DIAGNOSIS — E1169 Type 2 diabetes mellitus with other specified complication: Secondary | ICD-10-CM | POA: Diagnosis not present

## 2017-11-08 DIAGNOSIS — Z5321 Procedure and treatment not carried out due to patient leaving prior to being seen by health care provider: Secondary | ICD-10-CM

## 2017-11-08 LAB — CBC WITH DIFFERENTIAL/PLATELET
BASOS PCT: 0 %
Basophils Absolute: 0 10*3/uL (ref 0.0–0.1)
Eosinophils Absolute: 0.4 10*3/uL (ref 0.0–0.7)
Eosinophils Relative: 3 %
HCT: 44.9 % (ref 39.0–52.0)
HEMOGLOBIN: 15 g/dL (ref 13.0–17.0)
Lymphocytes Relative: 26 %
Lymphs Abs: 3.3 10*3/uL (ref 0.7–4.0)
MCH: 31.8 pg (ref 26.0–34.0)
MCHC: 33.4 g/dL (ref 30.0–36.0)
MCV: 95.3 fL (ref 78.0–100.0)
MONOS PCT: 6 %
Monocytes Absolute: 0.7 10*3/uL (ref 0.1–1.0)
NEUTROS PCT: 65 %
Neutro Abs: 8.2 10*3/uL — ABNORMAL HIGH (ref 1.7–7.7)
Platelets: 242 10*3/uL (ref 150–400)
RBC: 4.71 MIL/uL (ref 4.22–5.81)
RDW: 12.9 % (ref 11.5–15.5)
WBC: 12.6 10*3/uL — AB (ref 4.0–10.5)

## 2017-11-08 LAB — I-STAT CG4 LACTIC ACID, ED: Lactic Acid, Venous: 0.97 mmol/L (ref 0.5–1.9)

## 2017-11-08 LAB — COMPREHENSIVE METABOLIC PANEL
ALBUMIN: 3.3 g/dL — AB (ref 3.5–5.0)
ALT: 22 U/L (ref 17–63)
ANION GAP: 11 (ref 5–15)
AST: 16 U/L (ref 15–41)
Alkaline Phosphatase: 78 U/L (ref 38–126)
BILIRUBIN TOTAL: 0.4 mg/dL (ref 0.3–1.2)
BUN: 11 mg/dL (ref 6–20)
CO2: 27 mmol/L (ref 22–32)
Calcium: 9.2 mg/dL (ref 8.9–10.3)
Chloride: 97 mmol/L — ABNORMAL LOW (ref 101–111)
Creatinine, Ser: 0.91 mg/dL (ref 0.61–1.24)
GFR calc non Af Amer: >60 mL/min (ref >60)
GLUCOSE: 126 mg/dL — AB (ref 65–99)
POTASSIUM: 4 mmol/L (ref 3.5–5.1)
Sodium: 135 mmol/L (ref 135–145)
TOTAL PROTEIN: 7.4 g/dL (ref 6.5–8.1)

## 2017-11-08 LAB — PROTIME-INR
INR: 1.08
PROTHROMBIN TIME: 14 s (ref 11.4–15.2)

## 2017-11-08 NOTE — ED Notes (Signed)
Pt has 3 cm x 2 cm with depth of 1 cm wound to the bottom of R foot

## 2017-11-08 NOTE — ED Triage Notes (Signed)
Pt sent here for eval d/t abnormal [facility], per pt he has a diabetic foot ulcer and was seen by wound care today, pt sent here for potential osteomyelitis, A&O x4

## 2017-11-09 NOTE — ED Notes (Signed)
Called x 3 no answer 

## 2017-11-10 ENCOUNTER — Inpatient Hospital Stay (HOSPITAL_COMMUNITY)
Admission: EM | Admit: 2017-11-10 | Discharge: 2017-11-16 | DRG: 617 | Disposition: A | Discharge status: Home / Self Care | Payer: 59 | Attending: Internal Medicine | Admitting: Internal Medicine

## 2017-11-10 ENCOUNTER — Emergency Department (HOSPITAL_COMMUNITY): Payer: 59

## 2017-11-10 ENCOUNTER — Encounter (HOSPITAL_COMMUNITY): Payer: Self-pay | Admitting: Emergency Medicine

## 2017-11-10 DIAGNOSIS — L02612 Cutaneous abscess of left foot: Secondary | ICD-10-CM | POA: Diagnosis present

## 2017-11-10 DIAGNOSIS — E1169 Type 2 diabetes mellitus with other specified complication: Secondary | ICD-10-CM | POA: Diagnosis present

## 2017-11-10 DIAGNOSIS — Z95828 Presence of other vascular implants and grafts: Secondary | ICD-10-CM | POA: Diagnosis not present

## 2017-11-10 DIAGNOSIS — M19079 Primary osteoarthritis, unspecified ankle and foot: Secondary | ICD-10-CM | POA: Diagnosis present

## 2017-11-10 DIAGNOSIS — L97529 Non-pressure chronic ulcer of other part of left foot with unspecified severity: Secondary | ICD-10-CM | POA: Diagnosis present

## 2017-11-10 DIAGNOSIS — I10 Essential (primary) hypertension: Secondary | ICD-10-CM | POA: Diagnosis present

## 2017-11-10 DIAGNOSIS — E11621 Type 2 diabetes mellitus with foot ulcer: Secondary | ICD-10-CM | POA: Diagnosis present

## 2017-11-10 DIAGNOSIS — E1165 Type 2 diabetes mellitus with hyperglycemia: Secondary | ICD-10-CM | POA: Diagnosis present

## 2017-11-10 DIAGNOSIS — L089 Local infection of the skin and subcutaneous tissue, unspecified: Secondary | ICD-10-CM | POA: Diagnosis present

## 2017-11-10 DIAGNOSIS — E1142 Type 2 diabetes mellitus with diabetic polyneuropathy: Secondary | ICD-10-CM | POA: Diagnosis not present

## 2017-11-10 DIAGNOSIS — E11628 Type 2 diabetes mellitus with other skin complications: Secondary | ICD-10-CM | POA: Diagnosis not present

## 2017-11-10 DIAGNOSIS — I70202 Unspecified atherosclerosis of native arteries of extremities, left leg: Secondary | ICD-10-CM | POA: Diagnosis present

## 2017-11-10 DIAGNOSIS — F17210 Nicotine dependence, cigarettes, uncomplicated: Secondary | ICD-10-CM | POA: Diagnosis present

## 2017-11-10 DIAGNOSIS — F419 Anxiety disorder, unspecified: Secondary | ICD-10-CM | POA: Diagnosis present

## 2017-11-10 DIAGNOSIS — M86272 Subacute osteomyelitis, left ankle and foot: Secondary | ICD-10-CM | POA: Diagnosis not present

## 2017-11-10 DIAGNOSIS — M869 Osteomyelitis, unspecified: Secondary | ICD-10-CM

## 2017-11-10 DIAGNOSIS — E785 Hyperlipidemia, unspecified: Secondary | ICD-10-CM | POA: Diagnosis present

## 2017-11-10 DIAGNOSIS — Z23 Encounter for immunization: Secondary | ICD-10-CM | POA: Diagnosis not present

## 2017-11-10 DIAGNOSIS — E119 Type 2 diabetes mellitus without complications: Secondary | ICD-10-CM | POA: Diagnosis not present

## 2017-11-10 DIAGNOSIS — F329 Major depressive disorder, single episode, unspecified: Secondary | ICD-10-CM | POA: Diagnosis present

## 2017-11-10 DIAGNOSIS — Z794 Long term (current) use of insulin: Secondary | ICD-10-CM | POA: Diagnosis not present

## 2017-11-10 DIAGNOSIS — M86072 Acute hematogenous osteomyelitis, left ankle and foot: Secondary | ICD-10-CM | POA: Diagnosis not present

## 2017-11-10 DIAGNOSIS — M009 Pyogenic arthritis, unspecified: Secondary | ICD-10-CM | POA: Diagnosis not present

## 2017-11-10 DIAGNOSIS — M86172 Other acute osteomyelitis, left ankle and foot: Secondary | ICD-10-CM

## 2017-11-10 DIAGNOSIS — E1151 Type 2 diabetes mellitus with diabetic peripheral angiopathy without gangrene: Secondary | ICD-10-CM | POA: Diagnosis present

## 2017-11-10 DIAGNOSIS — I739 Peripheral vascular disease, unspecified: Secondary | ICD-10-CM | POA: Diagnosis not present

## 2017-11-10 LAB — CBC WITH DIFFERENTIAL/PLATELET
BASOS ABS: 0 10*3/uL (ref 0.0–0.1)
BASOS PCT: 0 %
Eosinophils Absolute: 0.3 10*3/uL (ref 0.0–0.7)
Eosinophils Relative: 2 %
HEMATOCRIT: 44.8 % (ref 39.0–52.0)
Hemoglobin: 15.1 g/dL (ref 13.0–17.0)
LYMPHS PCT: 26 %
Lymphs Abs: 3.6 10*3/uL (ref 0.7–4.0)
MCH: 31.7 pg (ref 26.0–34.0)
MCHC: 33.7 g/dL (ref 30.0–36.0)
MCV: 93.9 fL (ref 78.0–100.0)
MONO ABS: 0.8 10*3/uL (ref 0.1–1.0)
Monocytes Relative: 6 %
NEUTROS ABS: 8.9 10*3/uL — AB (ref 1.7–7.7)
NEUTROS PCT: 66 %
Platelets: 310 10*3/uL (ref 150–400)
RBC: 4.77 MIL/uL (ref 4.22–5.81)
RDW: 12.7 % (ref 11.5–15.5)
WBC: 13.7 10*3/uL — AB (ref 4.0–10.5)

## 2017-11-10 LAB — SEDIMENTATION RATE: Sed Rate: 60 mm/hr — ABNORMAL HIGH (ref 0–16)

## 2017-11-10 LAB — COMPREHENSIVE METABOLIC PANEL
ALT: 21 U/L (ref 17–63)
ANION GAP: 10 (ref 5–15)
AST: 16 U/L (ref 15–41)
Albumin: 3.4 g/dL — ABNORMAL LOW (ref 3.5–5.0)
Alkaline Phosphatase: 78 U/L (ref 38–126)
BILIRUBIN TOTAL: 0.4 mg/dL (ref 0.3–1.2)
BUN: 9 mg/dL (ref 6–20)
CHLORIDE: 99 mmol/L — AB (ref 101–111)
CO2: 28 mmol/L (ref 22–32)
Calcium: 9.1 mg/dL (ref 8.9–10.3)
Creatinine, Ser: 0.88 mg/dL (ref 0.61–1.24)
Glucose, Bld: 118 mg/dL — ABNORMAL HIGH (ref 65–99)
POTASSIUM: 4.3 mmol/L (ref 3.5–5.1)
Sodium: 137 mmol/L (ref 135–145)
TOTAL PROTEIN: 7.5 g/dL (ref 6.5–8.1)

## 2017-11-10 LAB — HEMOGLOBIN A1C
HEMOGLOBIN A1C: 7.2 % — AB (ref 4.8–5.6)
MEAN PLASMA GLUCOSE: 159.94 mg/dL

## 2017-11-10 LAB — C-REACTIVE PROTEIN: CRP: 3.2 mg/dL — AB (ref <1.0)

## 2017-11-10 LAB — GLUCOSE, CAPILLARY: GLUCOSE-CAPILLARY: 155 mg/dL — AB (ref 65–99)

## 2017-11-10 LAB — PROTIME-INR
INR: 1.05
Prothrombin Time: 13.6 seconds (ref 11.4–15.2)

## 2017-11-10 LAB — I-STAT CG4 LACTIC ACID, ED: LACTIC ACID, VENOUS: 1.09 mmol/L (ref 0.5–1.9)

## 2017-11-10 IMAGING — CR DG FOOT COMPLETE 3+V*L*
3 series · 3 of 3 positions shown · non-contrast
Comparison: [DATE] left foot radiographs

CLINICAL DATA: Diabetic left foot infection.

EXAM:
LEFT FOOT - COMPLETE 3+ VIEW

[foot ap]
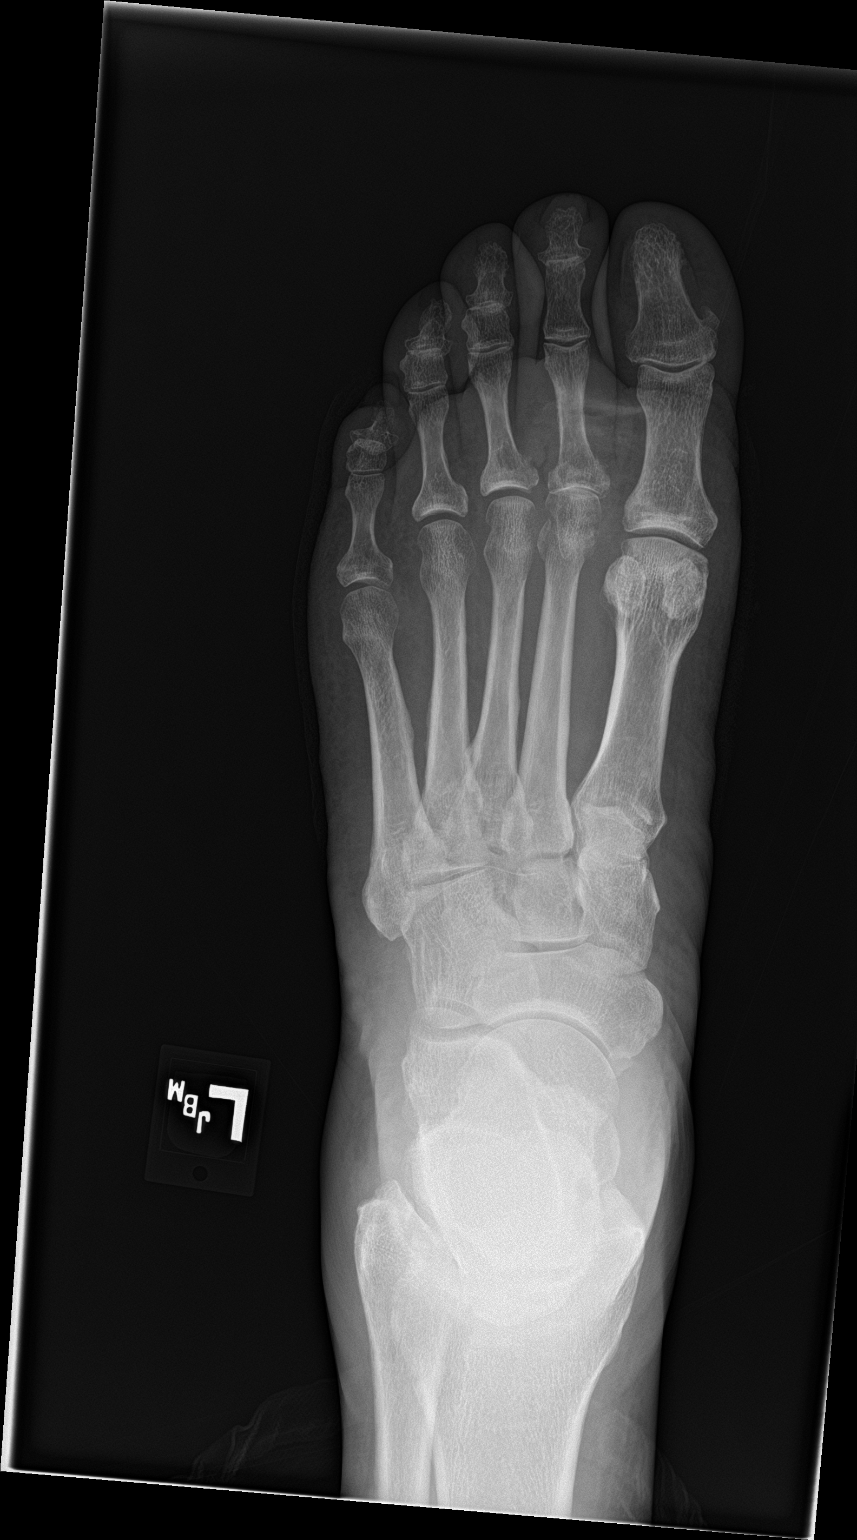

[foot obl]
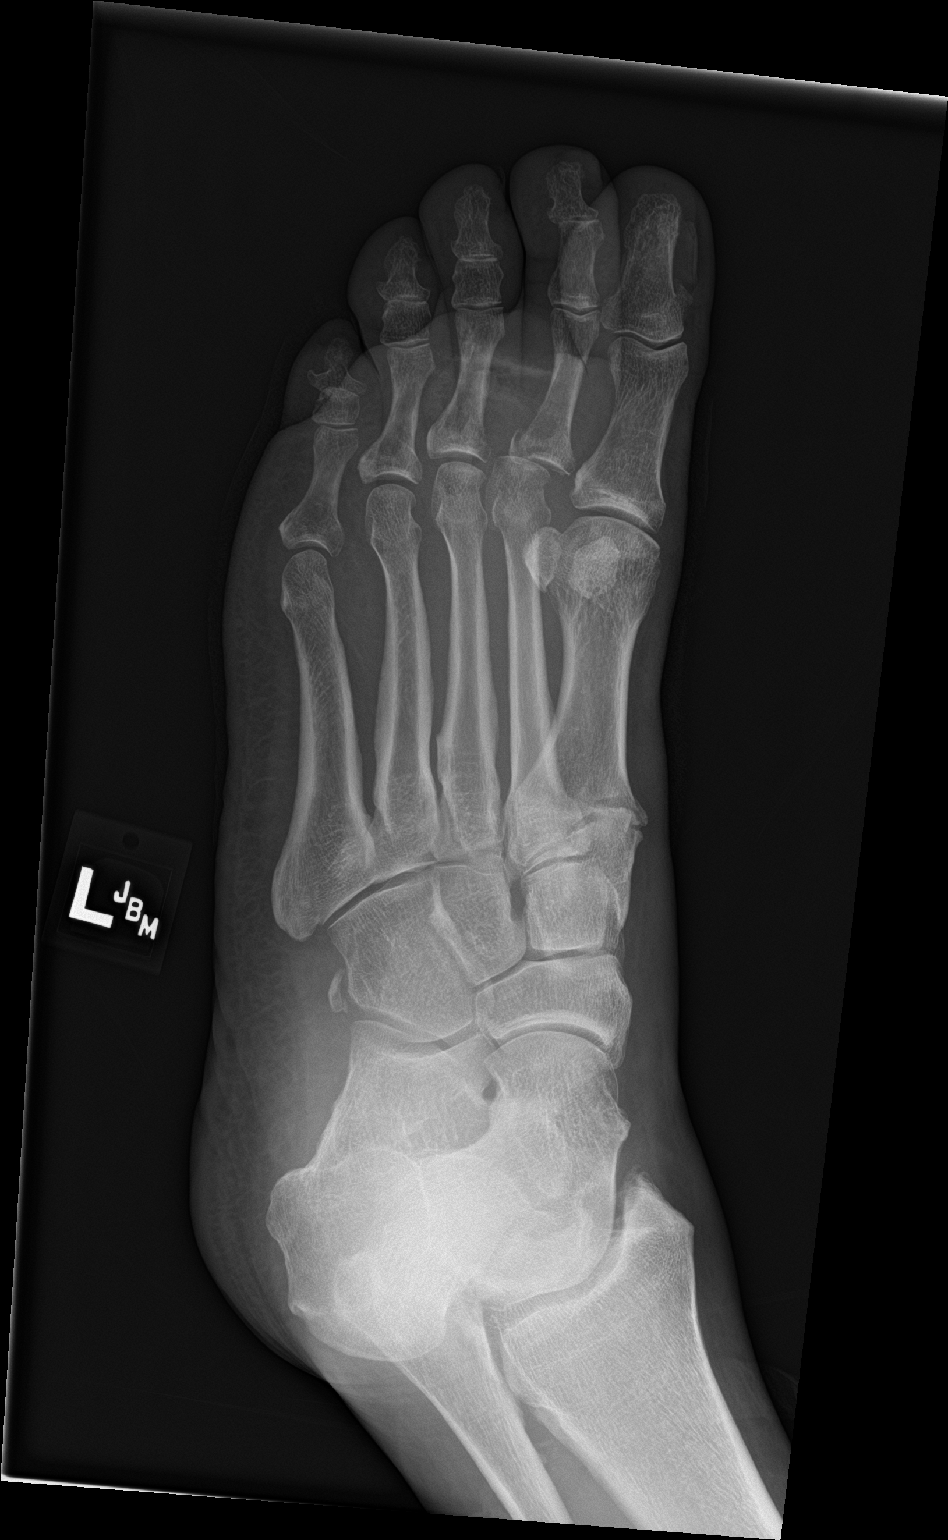

[foot lat]
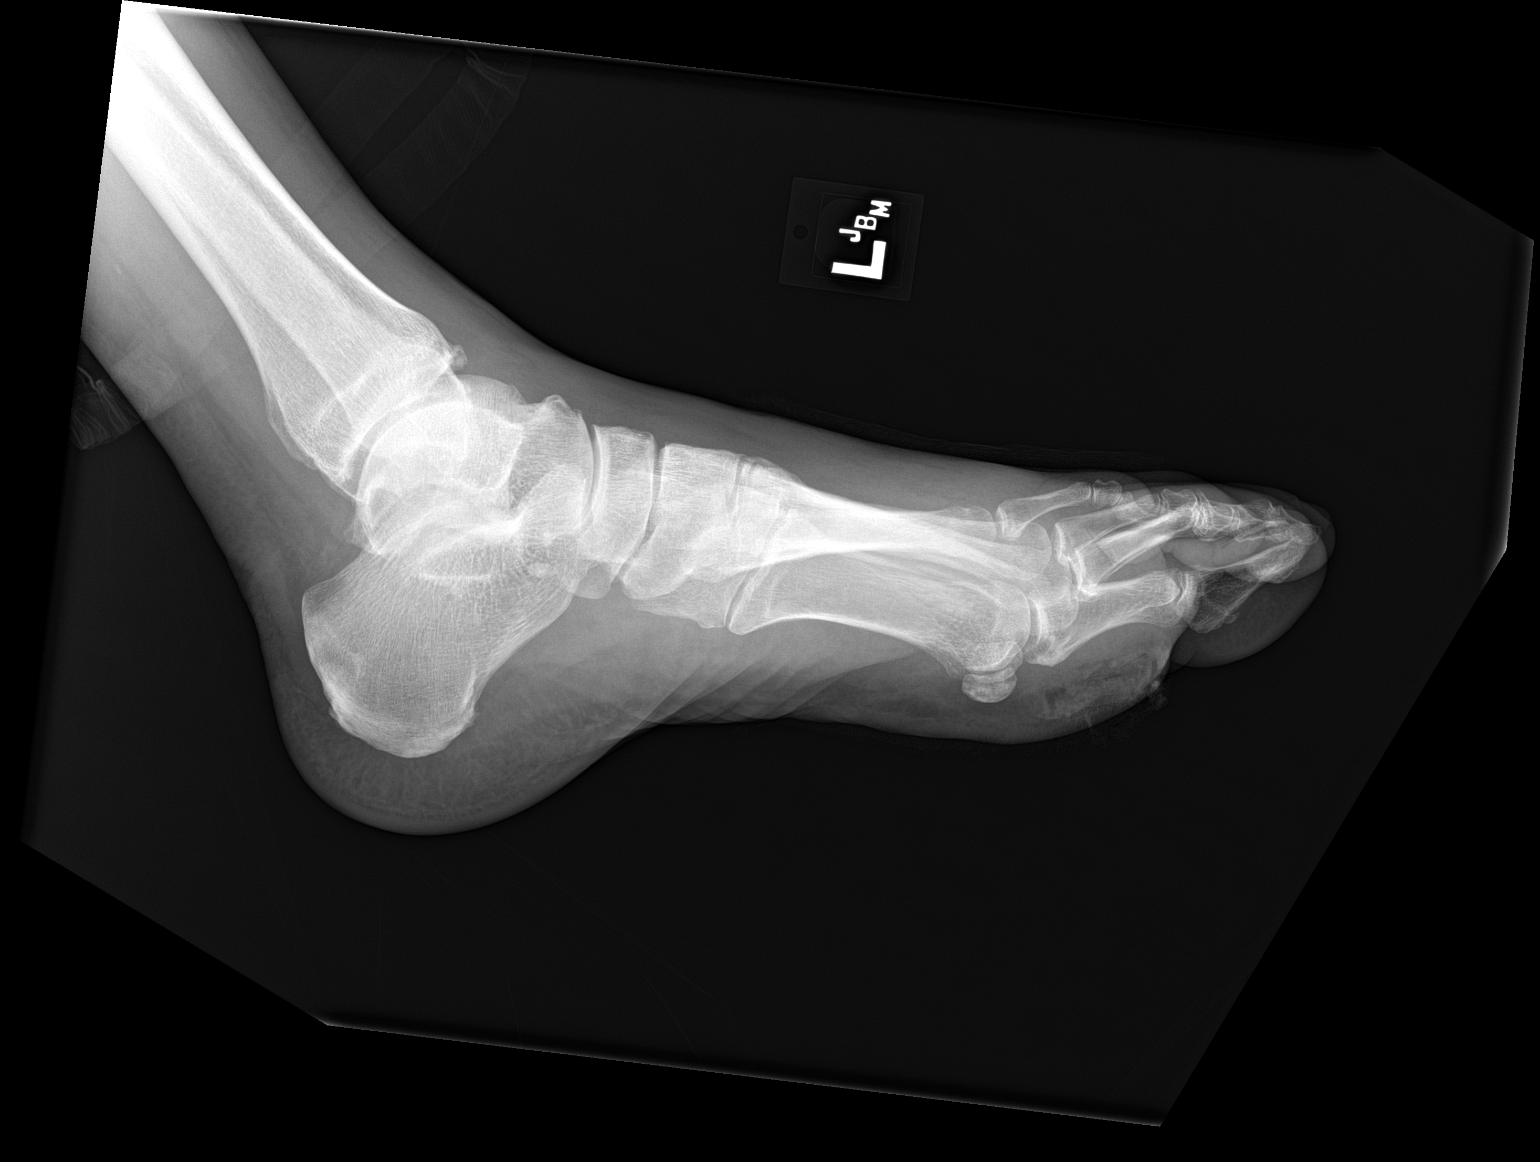

[3 of 3 positions shown; findings below may reference images not displayed]

FINDINGS: Plantar distal left foot soft tissue ulcer at the level of second
and third MTP joints. Cortical erosions are present at proximal
aspects of the proximal phalanges in the second and third toes.
There is a fracture subluxation of the base of the proximal phalanx
in the left second toe. No additional cortical erosions. No
periosteal reaction. No additional fractures. No additional
dislocation. Mild degenerative change at the first tarsometatarsal
joint. No radiopaque foreign body. Soft tissue swelling throughout
the mid to distal left foot.
IMPRESSION: Plantar distal left foot soft tissue ulcer at the level of the
second and third MTP joints.

Evidence of acute osteomyelitis involving proximal aspects of the
proximal phalanges of the second and third toes with cortical
erosions.

Fracture-subluxation of the base of the proximal phalanx in the left
second toe.

## 2017-11-10 MED ORDER — FENTANYL CITRATE (PF) 100 MCG/2ML IJ SOLN
50.0000 ug | INTRAMUSCULAR | Status: AC | PRN
  Administered 2017-11-10 – 2017-11-11 (×2): 50 ug via INTRAVENOUS
  Filled 2017-11-10 (×2): qty 2

## 2017-11-10 MED ORDER — NICOTINE 21 MG/24HR TD PT24
21.0000 mg | MEDICATED_PATCH | Freq: Every day | TRANSDERMAL | Status: DC
  Administered 2017-11-10 – 2017-11-16 (×7): 21 mg via TRANSDERMAL
  Filled 2017-11-10 (×7): qty 1

## 2017-11-10 MED ORDER — HEPARIN SODIUM (PORCINE) 5000 UNIT/ML IJ SOLN
5000.0000 [IU] | Freq: Three times a day (TID) | INTRAMUSCULAR | Status: DC
  Administered 2017-11-10 – 2017-11-13 (×9): 5000 [IU] via SUBCUTANEOUS
  Filled 2017-11-10 (×9): qty 1

## 2017-11-10 MED ORDER — INSULIN ASPART 100 UNIT/ML ~~LOC~~ SOLN
0.0000 [IU] | Freq: Four times a day (QID) | SUBCUTANEOUS | Status: DC
  Administered 2017-11-10 – 2017-11-12 (×2): 3 [IU] via SUBCUTANEOUS
  Administered 2017-11-12: 2 [IU] via SUBCUTANEOUS

## 2017-11-10 MED ORDER — INSULIN GLARGINE 100 UNIT/ML ~~LOC~~ SOLN
50.0000 [IU] | Freq: Every day | SUBCUTANEOUS | Status: DC
  Administered 2017-11-12 – 2017-11-15 (×4): 50 [IU] via SUBCUTANEOUS
  Filled 2017-11-10 (×6): qty 0.5

## 2017-11-10 MED ORDER — CITALOPRAM HYDROBROMIDE 40 MG PO TABS
40.0000 mg | ORAL_TABLET | Freq: Every day | ORAL | Status: DC
  Administered 2017-11-11 – 2017-11-16 (×6): 40 mg via ORAL
  Filled 2017-11-10 (×6): qty 1

## 2017-11-10 MED ORDER — ATORVASTATIN CALCIUM 80 MG PO TABS
80.0000 mg | ORAL_TABLET | Freq: Every day | ORAL | Status: DC
  Administered 2017-11-11 – 2017-11-16 (×6): 80 mg via ORAL
  Filled 2017-11-10 (×6): qty 1

## 2017-11-10 MED ORDER — GABAPENTIN 300 MG PO CAPS
300.0000 mg | ORAL_CAPSULE | Freq: Three times a day (TID) | ORAL | Status: DC
  Administered 2017-11-10 – 2017-11-16 (×16): 300 mg via ORAL
  Filled 2017-11-10 (×16): qty 1

## 2017-11-10 MED ORDER — POLYETHYLENE GLYCOL 3350 17 G PO PACK
17.0000 g | PACK | Freq: Every day | ORAL | Status: DC
  Administered 2017-11-12 – 2017-11-16 (×4): 17 g via ORAL
  Filled 2017-11-10 (×4): qty 1

## 2017-11-10 MED ORDER — ASPIRIN EC 81 MG PO TBEC
81.0000 mg | DELAYED_RELEASE_TABLET | Freq: Every day | ORAL | Status: DC
  Administered 2017-11-10 – 2017-11-16 (×6): 81 mg via ORAL
  Filled 2017-11-10 (×6): qty 1

## 2017-11-10 MED ORDER — OXYCODONE HCL 5 MG PO TABS
5.0000 mg | ORAL_TABLET | ORAL | Status: DC | PRN
  Administered 2017-11-10 – 2017-11-16 (×29): 10 mg via ORAL
  Filled 2017-11-10 (×29): qty 2

## 2017-11-10 MED ORDER — SODIUM CHLORIDE 0.9% FLUSH
3.0000 mL | Freq: Two times a day (BID) | INTRAVENOUS | Status: DC
  Administered 2017-11-11 – 2017-11-16 (×11): 3 mL via INTRAVENOUS

## 2017-11-10 MED ORDER — PIPERACILLIN-TAZOBACTAM 3.375 G IVPB 30 MIN
3.3750 g | Freq: Once | INTRAVENOUS | Status: AC
  Administered 2017-11-10: 3.375 g via INTRAVENOUS
  Filled 2017-11-10: qty 50

## 2017-11-10 NOTE — ED Provider Notes (Signed)
[facility] Provider Note   CSN: [id number] Arrival date & time: 11/10/17  1706     History   Chief Complaint Chief Complaint  Patient presents with  . Septic Arthritis    HPI Cody Pena is a 57 y.o. male.  57 year old male with prior history of diabetes and hypertension presents for evaluation of possible osteomyelitis in the left foot.  Patient reports that he has had a chronic ulcer to the bottom of his left foot for the last several months.  He is followed by a Dr. Ardyth Har[place]Hernandez in [place] at a wound clinic.  Reportedly he had an MRI done in [place] on Friday of this past week which showed a possible septic arthritis/osteomyelitis.  He reports that he has been on Cipro for the last 7 days.  Patient reports increased drainage from the ulceration on his foot.  Patient reports increasing pain to the left foot as well.  Reports subjective fevers.  The patient was sent by Dr. Ardyth HarpsHernandez [facility].  After a lengthy wait time in the ED, he decided that he would go home and return today.  The history is provided by the patient.  Illness  This is a new problem. The current episode started more than 1 week ago. The problem occurs constantly. The problem has been gradually worsening. Nothing aggravates the symptoms. Nothing relieves the symptoms. He has tried nothing for the symptoms.    Past Medical History:  Diagnosis Date  . Diabetes mellitus without complication (HCC)   . Hypertension     Patient Active Problem List   Diagnosis Date Noted  . Post op infection   . Delirium 12/07/2014  . Sepsis (HCC) 12/07/2014  . 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 Cervical vertebral fusion 12/07/2014  . Hallucination 12/07/2014  . Diabetes mellitus (HCC) 12/07/2014  . Mood disorder (HCC) 12/07/2014  . Acute encephalopathy   . Pyrexia   . Neck swelling     Past Surgical History:  Procedure Laterality Date  . CERVICAL FUSION           Home Medications    Prior to Admission medications   Medication Sig Start Date End Date Taking? Authorizing Provider  atorvastatin (LIPITOR) 80 MG tablet Take 80 mg by mouth daily.    [provider]  citalopram (CELEXA) 40 MG tablet Take 40 mg by mouth daily.    [provider]  diclofenac sodium (VOLTAREN) 1 % GEL Apply 1 application topically 4 (four) times daily. 09/19/16   [provider]  Dulaglutide (TRULICITY [place]) Inject 1 Device into the skin every 7 (seven) days.     [provider]  gabapentin (NEURONTIN) 600 MG tablet Take 600 mg by mouth 3 (three) times daily.     [provider]  HYDROcodone-acetaminophen (NORCO) 10-325 MG per tablet Take 1 tablet by mouth every 4 (four) hours as needed for moderate pain.     [provider]  Insulin Glargine (BASAGLAR KWIKPEN [place]) Inject into the skin.    [provider]  lisinopril (PRINIVIL,ZESTRIL) 10 MG tablet Take 10 mg by mouth daily.    [provider]  Vitamin D, Ergocalciferol, (DRISDOL) 50000 units CAPS capsule Take 50,000 Units by mouth every 7 (seven) days.    [provider]    Family History No family history on file.  Social History Social History   Tobacco Use  . Smoking status: Current Every Day Smoker  . Smokeless  tobacco: Never Used  Substance Use Topics  . Alcohol use: No  . Drug use: No     Allergies   Patient has no known allergies.   Review of Systems Review of Systems  Musculoskeletal:       Left foot pain and drainage  All other systems reviewed and are negative.    Physical Exam Updated Vital Signs BP (!) 146/89 (BP Location: Right Arm)   Pulse 88   Temp 98.2 F (36.8 C) (Oral)   Resp 12   Ht 6\' 3"  (1.905 m)   Wt 111.1 kg (245 lb)   SpO2 97%   BMI 30.62 kg/m   Physical Exam  Constitutional: He is oriented to person, place, and time. He appears well-developed and well-nourished. No distress.    HENT:  Head: Normocephalic and atraumatic.  Mouth/Throat: Oropharynx is clear and moist.  Eyes: Pupils are equal, round, and reactive to light. Conjunctivae and EOM are normal.  Neck: Normal range of motion. Neck supple.  Cardiovascular: Normal rate, regular rhythm and normal heart sounds.  Pulmonary/Chest: Effort normal and breath sounds normal. No respiratory distress.  Abdominal: Soft. He exhibits no distension. There is no tenderness.  Musculoskeletal: Normal range of motion. He exhibits no edema or deformity.  Neurological: He is alert and oriented to person, place, and time.  Skin: Skin is warm and dry.  Left foot with a chronic appearing ulcer to the plantar aspect of the foot - copious drainage from the ulcer noted.  Erythema noted surrounding the ulceration and extending to the dorsal aspect of the left foot.  Psychiatric: He has a normal mood and affect.  Nursing note and vitals reviewed.    ED Treatments / Results  Labs (all labs ordered are listed, but only abnormal results are displayed) Labs Reviewed  COMPREHENSIVE METABOLIC PANEL - Abnormal; Notable for the following components:      Result Value   Chloride 99 (*)    Glucose, Bld 118 (*)    Albumin 3.4 (*)    All other components within normal limits  CBC WITH DIFFERENTIAL/PLATELET - Abnormal; Notable for the following components:   WBC 13.7 (*)    Neutro Abs 8.9 (*)    All other components within normal limits  CULTURE, BLOOD (ROUTINE X 2)  CULTURE, BLOOD (ROUTINE X 2)  AEROBIC CULTURE (SUPERFICIAL SPECIMEN)  PROTIME-INR  I-STAT CG4 LACTIC ACID, ED  I-STAT CG4 LACTIC ACID, ED    EKG None  Radiology No results found.  Procedures Procedures (including critical care time)  Medications Ordered in ED Medications  fentaNYL (SUBLIMAZE) injection 50 mcg (50 mcg Intravenous Given 11/10/17 1813)     Initial Impression / Assessment and Plan / ED Course  I have reviewed the triage vital signs and the  nursing notes.  Pertinent labs & imaging results that were available during my care of the patient were reviewed by me and considered in my medical decision making (see chart for details).     MDM Screen complete Patient is presenting with reported worsening of a left foot diabetic infection.  This appears to have failed outpatient management and the patient may have developed osteomyelitis of the left foot.  Patient will be admitted to the medicine service for further workup and treatment.  Screening labs in the ED today reveal a mildly elevated white count at 13.  Other labs are without significant acute abnormality.  Patient understands and is agreement with plan of care.   Final Clinical Impressions(s) /  ED Diagnoses   Final diagnoses:  Diabetic foot infection [facility])    ED Discharge Orders    None       Wynetta Fines, MD 11/10/17 1949

## 2017-11-10 NOTE — H&P (Signed)
History and Physical   Cody Pena ITG:[id number] DOB: [birth year] DOA: 11/10/2017  PCP: Tamsen Roers, MD  Chief Complaint: left foot wound  HPI: this is a 57 year old man with medical problems including insulin-dependent diabetes complicated by peripheral neuropathy, anxiety and depression, hypertension, ongoing smoking who presents with left foot wound. He reports being followed by his local wound care clinic over the past year, reports that he had an MRI performed 3 days prior to admission, he was told to calm to the emergency department for further management of what he reports as osteomyelitis.  He reports over the course the past year he has been managed by his local health care team in the outpatient setting, sites he gets his care by the [facility] health care team. Reports that he has undergone vascular procedures including stents to optimize his arterial blood flow, this performed over 2 months ago per his report. He reports over the past month, his left foot wound has worsened, reports that has become more painful with prolonged standing, has had more drainage of clear fluid.He was put on a trial of ciprofloxacin one week ago, reports this mildly improved his symptoms.  He denies fevers, chills, nausea, vomiting, diarrhea. Current pain level is 0 out of 10. He works with a company that [employer], continues to smoke on a daily basis, does not drink alcohol. He lives with his wife and daughter and grandchild.  He came to the emergency department on 11/08/2017 however due to prolonged waiting, he left without being seen.  ED Course: vital signs remarkable for normal heart rate, systolic blood pressure in the 140s,labs remarkable for a normal lactic acid, white count of 13.7, creatinine normal.  Radiographs of the left foot revealed plantar distal left foot and soft tissue ulcer at the level of the second and third MTP joints, with evidence of acute  osteomyelitis.  Emergency medicine team gave Pipracil and tazobactam in addition to IV fentanyl. Hospital medicine was consulted for further management.  Review of Systems: A complete ROS was obtained; pertinent positives negatives are denoted in the HPI. Otherwise, all systems are negative.   Past Medical History:  Diagnosis Date  . Diabetes mellitus without complication ([place])   . Hypertension    Social History   Socioeconomic History  . Marital status: Married    Spouse name: Not on file  . Number of children: Not on file  . Years of education: Not on file  . Highest education level: Not on file  Occupational History  . Not on file  Social Needs  . Financial resource strain: Not on file  . Food insecurity:    Worry: Not on file    Inability: Not on file  . Transportation needs:    Medical: Not on file    Non-medical: Not on file  Tobacco Use  . Smoking status: Current Every Day Smoker  . Smokeless tobacco: Never Used  Substance and Sexual Activity  . Alcohol use: No  . Drug use: No  . Sexual activity: Not on file  Lifestyle  . Physical activity:    Days per week: Not on file    Minutes per session: Not on file  . Stress: Not on file  Relationships  . Social connections:    Talks on phone: Not on file    Gets together: Not on file    Attends religious service: Not on file    Active member of club or organization: Not on file  Attends meetings of clubs or organizations: Not on file    Relationship status: Not on file  . Intimate partner violence:    Fear of current or ex partner: Not on file    Emotionally abused: Not on file    Physically abused: Not on file    Forced sexual activity: Not on file  Other Topics Concern  . Not on file  Social History Narrative  . Not on file   Family hx: Father with COPD.  Physical Exam: Vitals:   11/10/17 1753 11/10/17 2015 11/10/17 2205  BP: (!) 146/89 108/65 (!) 149/94  Pulse: 88 80 89  Resp: 12  18  Temp: 98.2 F  (36.8 C)  97.6 F (36.4 C)  TempSrc: Oral  Oral  SpO2: 97% 94% 99%  Weight: 111.1 kg (245 lb)    Height: 6' 3" (1.905 m)     General: Appears calm and comfortable, white man. ENT: Grossly normal hearing, MMM.  Poor dentition. Cardiovascular: RRR. No M/R/G.  Respiratory: CTA bilaterally. No wheezes or crackles. Normal respiratory effort. Abdomen: Soft, non-tender.  Skin: Left foot with swelling, erythema of distal toes.  Plantar aspect of forefoot with ~2 cm x 3 cm wound, no grossly purulent drainage noted; peri-wound border with skin thickening, no bleeding. Musculoskeletal: Grossly normal tone BUE/BLE. Appropriate ROM.  Psychiatric: Grossly normal mood and affect. Neurologic: Moves all extremities in coordinated fashion.  I have personally reviewed the following labs, culture data, and imaging studies.  Assessment/Plan:  #Left foot osteomyelitis (proximal aspects of the proximal phalanges of the 2nd and 3rd toes). Occurring in setting of chronic left foot wound. Despite what sounds like longitudinal care with his local wound care team, wound has progressed to involve bone as indicated by radiograph on admission.  Although leukocytosis is present, he is hemodynamically stable. Plan: Given he is hemodynamically stable, to increase bone biopsy diagnostic yield - will hold off on further empiric antimicrobials at this time.  Consult orthopedics / podiatry in AM for bone biopsy and assess need for surgical procedure. Wound care consult has been placed to optimize topical wound efforts. ABIs ordered to assess blood flow/ circulation.  If he decompensates with worsening signs of sepsis, will re-initiate empiric antimicrobial coverage. ESR and CRP. Follow up blood cultures. NPO at midnight until surgical team has assessed.  #Other problems -Smoking: cessation provided, nicotine patch -PAD: repeat ABIs as above; continue statin + ASA 81 mg -Depression / anxiety: continue SSRI -Chronic pain:  transition gabapentin to 300 mg TID, oxycodone 5-10 mg for moderate-severe pain + poly glycol for bowel regimen -Diabetes, insulin dependent: holding home dulaglutide; continue basal insulin (reduced dose from 80 units to 50 units) along with rapid acting corrective insulin    DVT prophylaxis: Subq Hep Code Status: full Disposition Plan: Anticipate D/C home in 2-5 days Consults called: wound care consulted; day team to consult orthopedics / podiatry  Admission status: admit to hospital medicine service   Cheri Rous, MD [facility] LKGM:[phone]  If 7PM-7AM, please contact night-coverage [email or web address] Password [email or web address]

## 2017-11-10 NOTE — ED Triage Notes (Signed)
Pt told to go to ED by his wound care doctor after MRI showed septic arthritis, bursitis, bone infection - secondary from diabetic foot ulcer of L foot. Pt states he came Friday and left before being seen by the doctor. Patient states he was supposed to be admitted Friday. Pt has cam walker on foot, reports severe pain. Pt's VSS in triage.

## 2017-11-11 ENCOUNTER — Other Ambulatory Visit: Payer: Self-pay

## 2017-11-11 DIAGNOSIS — M86172 Other acute osteomyelitis, left ankle and foot: Secondary | ICD-10-CM

## 2017-11-11 DIAGNOSIS — E11628 Type 2 diabetes mellitus with other skin complications: Secondary | ICD-10-CM

## 2017-11-11 DIAGNOSIS — M009 Pyogenic arthritis, unspecified: Secondary | ICD-10-CM

## 2017-11-11 DIAGNOSIS — L089 Local infection of the skin and subcutaneous tissue, unspecified: Secondary | ICD-10-CM

## 2017-11-11 DIAGNOSIS — E119 Type 2 diabetes mellitus without complications: Secondary | ICD-10-CM

## 2017-11-11 DIAGNOSIS — I739 Peripheral vascular disease, unspecified: Secondary | ICD-10-CM

## 2017-11-11 DIAGNOSIS — E1151 Type 2 diabetes mellitus with diabetic peripheral angiopathy without gangrene: Secondary | ICD-10-CM

## 2017-11-11 DIAGNOSIS — F17210 Nicotine dependence, cigarettes, uncomplicated: Secondary | ICD-10-CM

## 2017-11-11 DIAGNOSIS — E1142 Type 2 diabetes mellitus with diabetic polyneuropathy: Secondary | ICD-10-CM

## 2017-11-11 LAB — COMPREHENSIVE METABOLIC PANEL
ALK PHOS: 74 U/L (ref 38–126)
ALT: 18 U/L (ref 17–63)
AST: 15 U/L (ref 15–41)
Albumin: 2.9 g/dL — ABNORMAL LOW (ref 3.5–5.0)
Anion gap: 10 (ref 5–15)
BILIRUBIN TOTAL: 0.4 mg/dL (ref 0.3–1.2)
BUN: 8 mg/dL (ref 6–20)
CALCIUM: 8.6 mg/dL — AB (ref 8.9–10.3)
CO2: 26 mmol/L (ref 22–32)
CREATININE: 0.85 mg/dL (ref 0.61–1.24)
Chloride: 101 mmol/L (ref 101–111)
GFR calc non Af Amer: >60 mL/min (ref >60)
Glucose, Bld: 108 mg/dL — ABNORMAL HIGH (ref 65–99)
Potassium: 3.9 mmol/L (ref 3.5–5.1)
SODIUM: 137 mmol/L (ref 135–145)
TOTAL PROTEIN: 6.1 g/dL — AB (ref 6.5–8.1)

## 2017-11-11 LAB — CBC
HCT: 40.5 % (ref 39.0–52.0)
Hemoglobin: 13.5 g/dL (ref 13.0–17.0)
MCH: 31.4 pg (ref 26.0–34.0)
MCHC: 33.3 g/dL (ref 30.0–36.0)
MCV: 94.2 fL (ref 78.0–100.0)
PLATELETS: 273 10*3/uL (ref 150–400)
RBC: 4.3 MIL/uL (ref 4.22–5.81)
RDW: 13.1 % (ref 11.5–15.5)
WBC: 11.2 10*3/uL — ABNORMAL HIGH (ref 4.0–10.5)

## 2017-11-11 LAB — GLUCOSE, CAPILLARY
GLUCOSE-CAPILLARY: 110 mg/dL — AB (ref 65–99)
GLUCOSE-CAPILLARY: 92 mg/dL (ref 65–99)
GLUCOSE-CAPILLARY: 142 mg/dL — AB (ref 65–99)
Glucose-Capillary: 97 mg/dL (ref 65–99)

## 2017-11-11 LAB — HIV ANTIBODY (ROUTINE TESTING W REFLEX): HIV Screen 4th Generation wRfx: NONREACTIVE

## 2017-11-11 MED ORDER — PNEUMOCOCCAL VAC POLYVALENT 25 MCG/0.5ML IJ INJ
0.5000 mL | INJECTION | INTRAMUSCULAR | Status: AC
  Administered 2017-11-12: 0.5 mL via INTRAMUSCULAR
  Filled 2017-11-11: qty 0.5

## 2017-11-11 MED ORDER — INFLUENZA VAC SPLIT QUAD 0.5 ML IM SUSY
0.5000 mL | PREFILLED_SYRINGE | INTRAMUSCULAR | Status: AC
  Administered 2017-11-12: 0.5 mL via INTRAMUSCULAR
  Filled 2017-11-11: qty 0.5

## 2017-11-11 NOTE — Consult Note (Addendum)
Hospital Consult    Reason for Consult:  PAD w ulceration L foot Requesting Physician:  Dr. Irene Limbo MRN #:  [id number]  History of Present Illness: This is a 57 y.o. male with past medical history significant for insulin-dependent diabetes mellitus, hyperlipidemia, and PAD seen in consultation for chronic ulceration of left plantar foot with MRI and plain film positive for osteomyelitis and possible septic arthritis.  He is status post left SFA atherectomy and stenting by interventional radiology in 03/2017.  Ulceration was present at that time the patient has been receiving wound care since that time.  He denies rest pain or claudication.  ABIs performed 05/2017 demonstrated a normal left ABI with triphasic waveforms posterior tibial artery.  He is taking an aspirin and statin daily.  He is a daily tobacco smoker.  Patient has not had anything to eat or drink today.  ABIs and arterial duplex are pending.  Past Medical History:  Diagnosis Date  . Diabetes mellitus without complication (HCC)   . Hypertension     Past Surgical History:  Procedure Laterality Date  . CERVICAL FUSION      No Known Allergies  Prior to Admission medications   Medication Sig Start Date End Date Taking? Authorizing Provider  citalopram (CELEXA) 40 MG tablet Take 40 mg by mouth daily.   Yes [provider]  Dulaglutide (TRULICITY [place]) Inject 1 Device into the skin every 7 (seven) days.    Yes [provider]  gabapentin (NEURONTIN) 600 MG tablet Take 600 mg by mouth daily.    Yes [provider]  HYDROcodone-acetaminophen (NORCO) 10-325 MG per tablet Take 1 tablet by mouth every 4 (four) hours as needed for moderate pain.    Yes [provider]  Insulin Glargine (BASAGLAR KWIKPEN [place]) Inject 80 Units into the skin at bedtime.    Yes [provider]  irbesartan (AVAPRO) 150 MG tablet Take 150 mg by mouth daily.   Yes [provider]  Vitamin D, Ergocalciferol,  (DRISDOL) 50000 units CAPS capsule Take 50,000 Units by mouth every 7 (seven) days.   Yes [provider]  atorvastatin (LIPITOR) 80 MG tablet Take 80 mg by mouth daily.    [provider]    Social History   Socioeconomic History  . Marital status: Married    Spouse name: Not on file  . Number of children: Not on file  . Years of education: Not on file  . Highest education level: Not on file  Occupational History  . Not on file  Social Needs  . Financial resource strain: Not on file  . Food insecurity:    Worry: Not on file    Inability: Not on file  . Transportation needs:    Medical: Not on file    Non-medical: Not on file  Tobacco Use  . Smoking status: Current Every Day Smoker  . Smokeless tobacco: Never Used  Substance and Sexual Activity  . Alcohol use: No  . Drug use: No  . Sexual activity: Not on file  Lifestyle  . Physical activity:    Days per week: Not on file    Minutes per session: Not on file  . Stress: Not on file  Relationships  . Social connections:    Talks on phone: Not on file    Gets together: Not on file    Attends religious service: Not on file    Active member of club or organization: Not on file    Attends meetings  of clubs or organizations: Not on file    Relationship status: Not on file  . Intimate partner violence:    Fear of current or ex partner: Not on file    Emotionally abused: Not on file    Physically abused: Not on file    Forced sexual activity: Not on file  Other Topics Concern  . Not on file  Social History Narrative  . Not on file     No family history on file.  ROS: Otherwise negative unless mentioned in HPI  Physical Examination  Vitals:   11/10/17 2205 11/11/17 0415  BP: (!) 149/94 114/73  Pulse: 89 76  Resp: 18 17  Temp: 97.6 F (36.4 C) 98.3 F (36.8 C)  SpO2: 99% 99%   Body mass index is 30.62 kg/m.  General:  WDWN in NAD Gait: Not observed HENT: WNL, normocephalic Pulmonary:  normal non-labored breathing, without Rales, rhonchi,  wheezing Cardiac: regular, without  Murmurs, rubs or gallops Abdomen:  soft, NT/ND, no masses Skin: without rashes Vascular Exam/Pulses: Palpable left PT; unable to palpate pedal pulses right lower extremity however foot is warm to touch with good capillary refill; left foot plantar surface ulceration 2 x 1 x 1cm without active drainage or frank infection Extremities: with ischemic changes, without Gangrene , without cellulitisMusculoskeletal: no muscle wasting or atrophy  Neurologic: A&O X 3;  No focal weakness or paresthesias are detected; speech is fluent/normal Psychiatric:  The pt has Normal affect. Lymph:  Unremarkable  CBC    Component Value Date/Time   WBC 11.2 (H) 11/11/2017 0457   RBC 4.30 11/11/2017 0457   HGB 13.5 11/11/2017 0457   HCT 40.5 11/11/2017 0457   PLT 273 11/11/2017 0457   MCV 94.2 11/11/2017 0457   MCH 31.4 11/11/2017 0457   MCHC 33.3 11/11/2017 0457   RDW 13.1 11/11/2017 0457   LYMPHSABS 3.6 11/10/2017 1815   MONOABS 0.8 11/10/2017 1815   EOSABS 0.3 11/10/2017 1815   BASOSABS 0.0 11/10/2017 1815    BMET    Component Value Date/Time   NA 137 11/11/2017 0457   K 3.9 11/11/2017 0457   CL 101 11/11/2017 0457   CO2 26 11/11/2017 0457   GLUCOSE 108 (H) 11/11/2017 0457   BUN 8 11/11/2017 0457   CREATININE 0.85 11/11/2017 0457   CALCIUM 8.6 (L) 11/11/2017 0457   GFRNONAA >60 11/11/2017 0457   GFRAA >60 11/11/2017 0457    COAGS: Lab Results  Component Value Date   INR 1.05 11/10/2017   INR 1.08 11/08/2017     Non-Invasive Vascular Imaging:   Arterial duplex and ABIs pending  Plain film and MRI positive for acute osteomyelitis second MTP Septic arthritis third MTP  Statin:  Yes.   Beta Blocker:  No. Aspirin:  Yes.   ACEI:  No. ARB:  Yes.   CCB use:  No Other antiplatelets/anticoagulants:  No.    ASSESSMENT/PLAN: This is a 57 y.o. male seen in consultation for chronic left foot  plantar ulceration with osteomyelitis 2nd, 3rd MTP by MRI status post left SFA atherectomy and stenting by interventional radiology in August 2018  -Left SFA stent likely patent given palpable left PTA pulse; ABIs and LLE arterial duplex pending  -Chronic nonhealing wound likely related to insulin-dependent diabetes with small vessel disease -Given palpable L PTA it is unlikely there will be any further revascularization options to offer however will discuss utility of LLE arteriogram with on-call surgeon Dr. Arbie Cookey -Continue current wound care for now  Emilie RutterMatthew Eveland PA-C [facility] [phone]   I have examined the patient, reviewed and agree with above.  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  The patient continues to have a 2-3+ posterior tibial pulse.  He reports that he is continued to have nonhealing of this ulceration despite 8 months of local care.  He now has erythema on the dorsum of his second and third metatarsal head and continued open ulcer on the plantar aspect.  MRI shows osteomyelitis of the second and third metatarsal joint.  I discussed this at length with the patient explaining that he has had adequate revascularization and despite this is had difficulty healing.  I have taken the liberty of consulting Dr. Aldean BakerMarcus Duda for treatment of his foot infection.  I explained to the patient that this may require open drainage and potentially could include up to a transmetatarsal amputation.  We will not follow actively.  Please call if we can assist  Gretta Beganodd Lovelyn Sheeran, MD 11/11/2017 5:14 PM

## 2017-11-11 NOTE — Progress Notes (Addendum)
Initial Nutrition Assessment  DOCUMENTATION CODES:   Not applicable  INTERVENTION:  Recommend Ensure Enlive po BID (each supplement provides 350 kcal and 20 grams of protein) once NPO order is lifted  Recommend multivitamin with minerals - daily  NUTRITION DIAGNOSIS:   Increased nutrient needs related to acute illness, wound healing(Left foot osteomyelitis) as evidenced by estimated needs.  GOAL:   Patient will meet greater than or equal to 90% of their needs  MONITOR:   Diet advancement, Labs, Weight trends, I & O's  REASON FOR ASSESSMENT:   Consult Wound healing  ASSESSMENT:   57 y.o M admitted for septic arthritis (osteomyelitis to L foot w/ T2DM, peripheral neuropathy, and peripheral vascular disease.   Pt reports that for the last 6 months he has been working hard to control his diabetes; his HgA1C was 12 and now it is 7.2. He was doing this by eating more fruits and vegetables, beans, whole grains, and grilled or baked chicken - avoiding the "heavy foods" like pizza and fried chicken.   For breakfast pt typically eats bran flakes with whole milk and sweetener, for lunch he would either bring something to work or get a salad and small hamburger at a [employer] restaurant, for dinner he will typically have beans, grilled chicken and whatever vegetable his wife cooks him - he likes to eat roasted vegetables.   Pt reports that his appetite is generally very good and is still good now, however, when his foot first became infected he had decreased appetite and ate very small meals (a small bowl or half a fistful worth of food).   Pt believed his weight was still 245 lbs since he last took it about a month ago, per new bed weight taken pt is currently 233 lbs - a 5% weight loss in 1 month.   Unsure if the 5% weight loss is intentional due to his changed lifestyle habits or if due to decreased appetite 2-3 weeks before receiving antibiotics.   Pt is at risk for malnutrition  given his nutrition history, mild fat and muscle wasting, and increased needs due to wound healing.   Medications reviewed: celexa, novolog 0-15 units, lantus 50 units, miralax, oxycodone, fentanyl.   Labs reviewed: BG 108 (H), WBC 11.2 (H) HgA1C 7.2 (H) 11/10/17  NUTRITION - FOCUSED PHYSICAL EXAM:    Most Recent Value  Orbital Region  No depletion  Upper Arm Region  Mild depletion  Thoracic and Lumbar Region  No depletion  Buccal Region  No depletion  Temple Region  Mild depletion  Clavicle Bone Region  No depletion  Clavicle and Acromion Bone Region  No depletion  Scapular Bone Region  Unable to assess  Dorsal Hand  Mild depletion  Patellar Region  Mild depletion  Anterior Thigh Region  Mild depletion  Posterior Calf Region  Mild depletion  Edema (RD Assessment)  None  Hair  Reviewed  Eyes  Reviewed  Mouth  Reviewed  Skin  Reviewed  Nails  Reviewed       Diet Order:  Diet NPO time specified  EDUCATION NEEDS:   Education needs have been addressed Discussed with pt the importance of optimizing protein during wound healing.   Skin:  Skin Assessment: Skin Integrity Issues: Skin Integrity Issues:: Other (Comment) Other: Full thickness wound w/ Osteomyelitis to L foot  Last BM:  11/10/17  Height:   Ht Readings from Last 1 Encounters:  11/10/17 6\' 3"  (1.905 m)    Weight:   Wt  Readings from Last 1 Encounters:  11/11/17 233 lb 14.5 oz (106.1 kg)    Ideal Body Weight:  89 kg   UBW: 245 lbs  (1 month ago) % UBW: 95% - 5% weight loss in 1 month  BMI:  Body mass index is 29.24 kg/m.  Estimated Nutritional Needs:   Kcal:  2300-2500 kcal (MSJ x 1.3-1.4 IBW)  Protein:  110-125 grams (1.3-1.4 IBW)  Fluid:  > 2.3 L (ml/kcal)    Sherrine Maples, Dietetic Intern

## 2017-11-11 NOTE — Progress Notes (Signed)
Referring Physician(s): Dr Arbie CookeyEarly  Supervising Physician: Malachy MoanMcCullough, Heath  [facility]  Chief Complaint:  Left foot pain - worsening x 2 weeks  Subjective:  Status post left SFA atherectomy and stenting 03/2017 Followed up with Dr Archer AsaMcCullough several weeks after procedure-- doing well per pt. Was to have another follow up today with Dr Archer AsaMcCullough-- [facility] with Left leg and foot pain  Vasc Surgery PA note: History of Present Illness: This is a 57 y.o. male with past medical history significant for insulin-dependent diabetes mellitus, hyperlipidemia, and PAD seen in consultation for chronic ulceration of left plantar foot with MRI and plain film positive for osteomyelitis and possible septic arthritis.  He is status post left SFA atherectomy and stenting by interventional radiology in 03/2017.  Ulceration was present at that time the patient has been receiving wound care since that time.  He denies rest pain or claudication.  ABIs performed 05/2017 demonstrated a normal left ABI with triphasic waveforms posterior tibial artery.  He is taking an aspirin and statin daily.  He is a daily tobacco smoker.  Patient has not had anything to eat or drink today.  ABIs and arterial duplex are pending.  ASSESSMENT/PLAN: This is a 57 y.o. male seen in consultation for chronic left foot plantar ulceration with osteomyelitis 2nd, 3rd MTP by MRI status post left SFA atherectomy and stenting by interventional radiology in August 2018 -Left SFA stent likely patent given palpable left PTA pulse; ABIs and LLE arterial duplex pending  -Chronic nonhealing wound likely related to insulin-dependent diabetes with small vessel disease -Given palpable L PTA it is unlikely there will be any further revascularization options to offer however will discuss utility of LLE arteriogram with on-call surgeon Dr. Arbie CookeyEarly  Pt states his foot pain has subsided somewhat I told him Dr  Archer AsaMcCullough asking about him    Allergies: Patient has no known allergies.  Medications: Prior to Admission medications   Medication Sig Start Date End Date Taking? Authorizing Provider  citalopram (CELEXA) 40 MG tablet Take 40 mg by mouth daily.   Yes [provider]  Dulaglutide (TRULICITY [place]) Inject 1 Device into the skin every 7 (seven) days.    Yes [provider]  gabapentin (NEURONTIN) 600 MG tablet Take 600 mg by mouth daily.    Yes [provider]  HYDROcodone-acetaminophen (NORCO) 10-325 MG per tablet Take 1 tablet by mouth every 4 (four) hours as needed for moderate pain.    Yes [provider]  Insulin Glargine (BASAGLAR KWIKPEN [place]) Inject 80 Units into the skin at bedtime.    Yes [provider]  irbesartan (AVAPRO) 150 MG tablet Take 150 mg by mouth daily.   Yes [provider]  Vitamin D, Ergocalciferol, (DRISDOL) 50000 units CAPS capsule Take 50,000 Units by mouth every 7 (seven) days.   Yes [provider]  atorvastatin (LIPITOR) 80 MG tablet Take 80 mg by mouth daily.    [provider]     Vital Signs: BP 114/73 (BP Location: Right Arm)   Pulse 76   Temp 98.3 F (36.8 C) (Oral)   Resp 17   Ht 6\' 3"  (1.905 m)   Wt 233 lb 14.5 oz (106.1 kg)   SpO2 99%   BMI 29.24 kg/m   Physical Exam  Constitutional: He appears well-nourished.  Pulmonary/Chest: Effort normal.  Abdominal: Soft. Bowel sounds are normal.  Musculoskeletal: Normal range of motion.  Able to  move all 4s Left Toes 2 and 3 are discolored; swollen; painfil appearing Entire foot and ankle wrapped in ace wrap I can palpate good pulse- lateral tib  Neurological: He is alert.  Skin: Skin is warm.  Left toes 2 and 3 are discolored; swollen; red painful  Psychiatric: He has a normal mood and affect. His behavior is normal.  Nursing note and vitals reviewed.   Imaging: Dg Foot Complete Left  Result Date: 11/10/2017 CLINICAL  DATA:  Diabetic left foot infection. EXAM: LEFT FOOT - COMPLETE 3+ VIEW COMPARISON:  05/22/2017 left foot radiographs FINDINGS: Plantar distal left foot soft tissue ulcer at the level of second and third MTP joints. Cortical erosions are present at proximal aspects of the proximal phalanges in the second and third toes. There is a fracture subluxation of the base of the proximal phalanx in the left second toe. No additional cortical erosions. No periosteal reaction. No additional fractures. No additional dislocation. Mild degenerative change at the first tarsometatarsal joint. No radiopaque foreign body. Soft tissue swelling throughout the mid to distal left foot. IMPRESSION: Plantar distal left foot soft tissue ulcer at the level of the second and third MTP joints. Evidence of acute osteomyelitis involving proximal aspects of the proximal phalanges of the second and third toes with cortical erosions. Fracture-subluxation of the base of the proximal phalanx in the left second toe. Electronically Signed   By: Delbert Phenix M.D.   On: 11/10/2017 20:47    Labs:  CBC: Recent Labs    11/08/17 1852 11/10/17 1815 11/11/17 0457  WBC 12.6* 13.7* 11.2*  HGB 15.0 15.1 13.5  HCT 44.9 44.8 40.5  PLT 242 310 273    COAGS: Recent Labs    11/08/17 1852 11/10/17 1815  INR 1.08 1.05    BMP: Recent Labs    11/08/17 1852 11/10/17 1815 11/11/17 0457  NA 135 137 137  K 4.0 4.3 3.9  CL 97* 99* 101  CO2 27 28 26   GLUCOSE 126* 118* 108*  BUN 11 9 8   CALCIUM 9.2 9.1 8.6*  CREATININE 0.91 0.88 0.85  GFRNONAA >60 >60 >60  GFRAA >60 >60 >60    LIVER FUNCTION TESTS: Recent Labs    11/08/17 1852 11/10/17 1815 11/11/17 0457  BILITOT 0.4 0.4 0.4  AST 16 16 15   ALT 22 21 18   ALKPHOS 78 78 74  PROT 7.4 7.5 6.1*  ALBUMIN 3.3* 3.4* 2.9*    Assessment and Plan:  Left leg pain Discoloration toes 2 and 3/osteomyelitis L SFA stent seems to be patent per exam Doppler studies ordered--  pending Vasc PA discussing with Dr Arbie Cookey next best plan Will follow   Electronically Signed: Rice Walsh A, PA-C 11/11/2017, 2:06 PM   I spent a total of 25 Minutes at the the patient's bedside AND on the patient's hospital floor or unit, greater than 50% of which was counseling/coordinating care for Left foot/toe pain

## 2017-11-11 NOTE — Consult Note (Signed)
ORTHOPAEDIC CONSULTATION  REQUESTING PHYSICIAN: Standley BrookingGoodrich, Daniel P, MD  Chief Complaint: Chronic ulcer beneath the second metatarsal head left foot.  HPI: Cody Pena is a 57 y.o. male who presents with peripheral vascular disease and diabetes.  Patient is status post vascular intervention with a patent SFA at this time.  Patient has a plantar ulcer that is chronic beneath the second metatarsal head.  Past Medical History:  Diagnosis Date  . Diabetes mellitus without complication (HCC)   . Hypertension    Past Surgical History:  Procedure Laterality Date  . CERVICAL FUSION     Social History   Socioeconomic History  . Marital status: Married    Spouse name: Not on file  . Number of children: Not on file  . Years of education: Not on file  . Highest education level: Not on file  Occupational History  . Not on file  Social Needs  . Financial resource strain: Not on file  . Food insecurity:    Worry: Not on file    Inability: Not on file  . Transportation needs:    Medical: Not on file    Non-medical: Not on file  Tobacco Use  . Smoking status: Current Every Day Smoker  . Smokeless tobacco: Never Used  Substance and Sexual Activity  . Alcohol use: No  . Drug use: No  . Sexual activity: Not on file  Lifestyle  . Physical activity:    Days per week: Not on file    Minutes per session: Not on file  . Stress: Not on file  Relationships  . Social connections:    Talks on phone: Not on file    Gets together: Not on file    Attends religious service: Not on file    Active member of club or organization: Not on file    Attends meetings of clubs or organizations: Not on file    Relationship status: Not on file  Other Topics Concern  . Not on file  Social History Narrative  . Not on file   No family history on file. - negative except otherwise stated in the family history section No Known Allergies Prior to Admission medications   Medication Sig Start  Date End Date Taking? Authorizing Provider  citalopram (CELEXA) 40 MG tablet Take 40 mg by mouth daily.   Yes [provider]  Dulaglutide (TRULICITY [place]) Inject 1 Device into the skin every 7 (seven) days.    Yes [provider]  gabapentin (NEURONTIN) 600 MG tablet Take 600 mg by mouth daily.    Yes [provider]  HYDROcodone-acetaminophen (NORCO) 10-325 MG per tablet Take 1 tablet by mouth every 4 (four) hours as needed for moderate pain.    Yes [provider]  Insulin Glargine (BASAGLAR KWIKPEN ) Inject 80 Units into the skin at bedtime.    Yes [provider]  irbesartan (AVAPRO) 150 MG tablet Take 150 mg by mouth daily.   Yes [provider]  Vitamin D, Ergocalciferol, (DRISDOL) 50000 units CAPS capsule Take 50,000 Units by mouth every 7 (seven) days.   Yes [provider]  atorvastatin (LIPITOR) 80 MG tablet Take 80 mg by mouth daily.    [provider]   Dg Foot Complete Left  Result Date: 11/10/2017 CLINICAL DATA:  Diabetic left foot infection. EXAM: LEFT FOOT - COMPLETE 3+ VIEW COMPARISON:  05/22/2017 left foot radiographs FINDINGS: Plantar distal left foot soft tissue ulcer at the level of second  and third MTP joints. Cortical erosions are present at proximal aspects of the proximal phalanges in the second and third toes. There is a fracture subluxation of the base of the proximal phalanx in the left second toe. No additional cortical erosions. No periosteal reaction. No additional fractures. No additional dislocation. Mild degenerative change at the first tarsometatarsal joint. No radiopaque foreign body. Soft tissue swelling throughout the mid to distal left foot. IMPRESSION: Plantar distal left foot soft tissue ulcer at the level of the second and third MTP joints. Evidence of acute osteomyelitis involving proximal aspects of the proximal phalanges of the second and third toes with cortical erosions.  Fracture-subluxation of the base of the proximal phalanx in the left second toe. Electronically Signed   By: Delbert Phenix M.D.   On: 11/10/2017 20:47   - pertinent xrays, CT, MRI studies were reviewed and independently interpreted  Positive ROS: All other systems have been reviewed and were otherwise negative with the exception of those mentioned in the HPI and as above.  Physical Exam: General: Alert, no acute distress Psychiatric: Patient is competent for consent with normal mood and affect Lymphatic: No axillary or cervical lymphadenopathy Cardiovascular: No pedal edema Respiratory: No cyanosis, no use of accessory musculature GI: No organomegaly, abdomen is soft and non-tender  Skin: Examination patient has an ulcer beneath the second metatarsal head which probes to bone and tendon.  The joint is palpable.  Images:  @ENCIMAGES @   Neurologic: Patient does not have protective sensation bilateral lower extremities.   MUSCULOSKELETAL:  Examination patient has a strong posterior tibial pulse to the left foot.  He has an ulcer which probes to bone the second metatarsal head.  Radiographs shows destructive bony change in the base of the proximal phalanx left foot second toe.  Assessment: Assessment: Diabetic insensate neuropathy peripheral vascular disease with a patent SFA and strong posterior tibial pulse with osteomyelitis ulceration Waggoner grade 3 ulcer to the left foot second MTP joint.  Plan: Plan: We will plan for a left foot second ray amputation anticipate surgery on Wednesday.  Patient could be discharged to home once he is able to safely ambulate without weightbearing on the left foot postoperatively.  Thank you for the consult and the opportunity to see Mr. Cody Stetzer, MD [facility] [phone] 6:46 PM

## 2017-11-11 NOTE — Consult Note (Addendum)
WOC Nurse wound consult note Reason for Consult: Consult requested for left plantar foot wound. Vascular team in the room to assess wound and discuss possible revascularization.  Wound type: Full thickness wound with osteomyelitis, according to the x-ray.  This complex medical condition is beyond the scope of practice for WOC nursing; please refer to ortho team for further plan of care.  Requested to provide topical treatment recommendations.  Pt is followed by the outpatient wound care center and uses Aquacel and ace wrap for dressings. Measurement:2.5X.8X.5cm Wound bed: red and moist  Drainage (amount, consistency, odor) mod amt tan drainage, no odor Periwound: Slightly raised callous surrounding wound edges. Dressing procedure/placement/frequency: Continue present plan of care with Aquacel and ace wrap for light compression.  Discussed plan of care with patient. Please re-consult if further assistance is needed.  Thank-you,  Cammie Mcgeeawn Rainn Zupko MSN, RN, CWOCN, YetterWCN-AP, CNS [phone]

## 2017-11-11 NOTE — Progress Notes (Addendum)
Cody NOTE  Cody Pena:[id number] DOB: [birth year] DOA: 11/10/2017 PCP: Tamsen Roers, Cody Pena  Brief Narrative: 57 year old man PMH diabetes mellitus with peripheral neuropathy, followed by wound clinic for left foot wound, sent to the emergency department after outpatient MRI revealed septic arthritis and osteomyelitis of the left foot.  Assessment/Plan Septic arthritis of the second MTP joint with osteomyelitis of the second metatarsal head and proximal phalanx, septic arthritis of third MTP joint, infectious intermetatarsal bursitis between the second and third metatarsal heads in the setting of chronic left foot wound.  (See MRI report below from [place]) --Antibiotics on hold pending surgical evaluation.  Minimal leukocytosis.  Afebrile.  No evidence of systemic infection at this point. --ABIs pending.  Vascular surgery consultation placed.   Peripheral vascular disease. S/p IR ATHERECTOMY W/ PTA AND STENT, Left lower extremity multi station arteriogram, Atherectomy, angioplasty and stent placement in the superficial femoral artery 04/12/2017 by IR at [facility] (see report below). ABI report 05/2017 (see below) showed Interval normalization of the resting and post exercise left ankle brachial index consistent with reconstruction of the previously occluded left superficial femoral artery. Persistent right lower extremity occlusive peripheral arterial disease of a mild severity at rest and moderate severity following exercise. --Plan as above.  No evidence to suggest acute ischemia. --Continue statin and aspirin. --On Plavix as an outpatient after stents placed.  Discussed with Dr. Laurence Ferrari interventional radiology, in regard to stent placement, no indication for Plavix at this point, okay to hold. IR will evaluate here.  Diabetes mellitus type 2 with peripheral neuropathy, peripheral vascular disease --Stable.  Continue gabapentin, basal insulin, sliding scale insulin  Essential  hypertension --Stable.  Resume irbesartan on discharge.  Cigarette smoker --will recommend cessation on discharge.  DVT prophylaxis: heparin, SCDs Code Status: full  Family Communication: none Disposition Plan: home    Cody Pena, Cody Pena  [facility] Direct contact: [phone] --[place]ia [place]  --[email or web address]; password [email or web address]  7PM-7AM contact night coverage as above 11/11/2017, 10:54 AM  LOS: 1 day   Consultants:  VVS  IR by phone  Procedures:    Antimicrobials:    Interval history/Subjective: Feels ok.  No chest pain or shortness of breath.  Minimal feeling in the left foot (chronic).  Objective: Vitals:  Vitals:   11/10/17 2205 11/11/17 0415  BP: (!) 149/94 114/73  Pulse: 89 76  Resp: 18 17  Temp: 97.6 F (36.4 C) 98.3 F (36.8 C)  SpO2: 99% 99%    Exam:  Constitutional:  . Appears calm and comfortable Eyes:  . pupils and irises appear normal ENMT:  . grossly normal hearing  . Lips appear normal Respiratory:  . CTA bilaterally, no w/r/r.  . Respiratory effort normal Cardiovascular:  . RRR, no m/r/g . No LE extremity edema   No dorsalis pedis pulses detected. Musculoskeletal:  . RLE o strength and tone normal, no atrophy, no abnormal movements o No tenderness, masses Skin:  . Large open plantar wound left foot, packed.  Surrounding skin appears unremarkable.  Dorsum of the foot appears unremarkable.  Toes appear to be perfused. . palpation of skin: no induration or nodules Psychiatric:  . Mental status o Mood, affect appropriate  I have personally reviewed the following:   Labs:  Basic metabolic panel unremarkable.  Hepatic function panel unremarkable.  WBC 11.2.  Remainder CBC unremarkable.  CRP 3.2  ESR 60  Hemoglobin A1c 7.2  Imaging studies:  MRI 11/08/2017 Exam(s): [id number] MRI/MRI FOOT LEFT W/WO CLINICAL DATA:  Diabetic  foot ulcer for 1 year  EXAM: MRI OF THE LEFT FOREFOOT WITHOUT AND WITH  CONTRAST  TECHNIQUE: Multiplanar, multisequence MR imaging of the left forefoot was performed both before and after administration of intravenous contrast.  CONTRAST:  20 mL MultiHance  COMPARISON:  None.  FINDINGS: Bones/Joint/Cartilage  Soft tissue ulcer along the plantar aspect of the second MTP joint. Bone destruction of the plantar aspect of the second metatarsal head with severe marrow edema in the second metatarsal head and shaft. Cortical irregularity along the plantar aspect of the base of the second proximal phalanx with severe surrounding marrow edema and enhancement. Moderate second MTP joint effusion with synovitis. Fluid in the intermetatarsal bursa between the second and third metatarsal heads with peripheral enhancement most concerning for infectious bursitis. Dorsal subluxation of the second proximal phalanx relative to the metatarsal head.  Nonspecific marrow edema with corresponding T1 hypointensity in the medial aspect of the third metatarsal head and along the base of third proximal phalanx with enhancement. Moderate third MTP joint effusion with synovitis.  No acute fracture or dislocation.  Alignment is otherwise normal.  Ligaments  Collateral ligaments are intact.  Lisfranc ligament is intact.  Muscles and Tendons Flexor, peroneal and extensor compartment tendons are intact. No muscle atrophy. Generalized T2 hyperintense signal throughout the plantar musculature likely neurogenic.  Soft tissue No other fluid collection or hematoma.  No soft tissue mass.  IMPRESSION: 1. Soft tissue ulcer along the plantar aspect of the second MTP joint. Septic arthritis of the second MTP joint with osteomyelitis of the second metatarsal head and proximal phalanx. 2. Septic arthritis of third MTP joint. 3. Infectious intermetatarsal bursitis between the second and third metatarsal heads.  05/2017 ABI Exam(s): [id number] US/US:ANKLE/BRACH INDICIES  w/EXER CLINICAL DATA:  57 year old male with left lower extremity critical limb ischemia now 1 month status post revascularization. Re-evaluate ankle-brachial indices in establish a new baseline.  EXAM: NONINVASIVE PHYSIOLOGIC VASCULAR STUDY OF BILATERAL LOWER EXTREMITIES WITH AND WITHOUT EXERCISE  TECHNIQUE: Evaluation of both lower extremities were performed at rest, including calculation of ankle-brachial indices, multiple segmental pressure evaluation, segmental Doppler and segmental pulse volume recording. Ankle brachial indices were also obtained following treadmill exercise.  COMPARISON:  Ankle-brachial indices preprocedure dated 03/27/2017  FINDINGS: RESTING  Right ABI: 0.84  Left ABI: 1.0  Right lower extremity: Triphasic arterial waveforms in the posterior tibial artery. The dorsalis pedis artery is a blunted monophasic waveform.  Left lower extremity: Triphasic arterial waveforms in the posterior tibial artery. The dorsalis pedis artery is a blunted monophasic waveform.  POST EXERCISE  Right ABI: 0.5  Left ABI: 1.0  IMPRESSION: 1. Interval normalization of the resting and post exercise left ankle brachial index consistent with reconstruction of the previously occluded left superficial femoral artery. 2. Persistent right lower extremity occlusive peripheral arterial disease of a mild severity at rest and moderate severity following exercise.  Signed,  Criselda Peaches, Cody Pena  [facility]  [facility]   Electronically Signed   By: Jacqulynn Cadet M.D.   On: 05/14/2017 10:38  04/12/17 Exam(s): [id number] VAS/ATHERECTOMY W/ PTA INDICATION: 57 year old male with a history of Ruther[place] category 5 critical limb ischemia involving the plantar surface of his left foot. 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 He presents today for attempted revascularization of the superficial femoral artery.  EXAM: IR ATHERECTOMY W/ PTA AND STENT  1. Ultrasound-guided  puncture right common femoral artery 2. Left lower extremity multi station arteriogram 3. Atherectomy, angioplasty and stent placement in the superficial femoral artery  MEDICATIONS: 2 g Ancef. The antibiotic was administered within 1 hour of the procedure. Additionally, 24000 units heparin was administered intravenously. 50 mg of protamine were administered intravenously at the end of the case.  ANESTHESIA/SEDATION: Moderate (conscious) sedation was employed during this procedure. A total of Versed 4 mg and Fentanyl 200 mcg was administered intravenously.  Moderate Sedation Time: 210 minutes. The patient's level of consciousness and vital signs were monitored continuously by radiology nursing throughout the procedure under my direct supervision.  CONTRAST:  200 mL Visipaque 320  FLUOROSCOPY TIME:  Fluoroscopy Time: 44 minutes 0 seconds.  COMPLICATIONS: SIR LEVEL B - Normal therapy, includes overnight admission for observation.  The patient was initially heparinized with a bolus of 9000 units of heparin (patient weight is 240 pounds, this is a fairly standard dose of approximately 90 units per kg). Approximately 1 hour and 20 minutes into the case, an additional 1500 units of heparin was ordered. However, there was an accidental miss administration [place] 44818 units of heparin. 50 mg of protamine was administered.  Following cessation of the case, a PTT was ordered and came back at 76 which remains consistent with therapeutic anticoagulation. Therefore, a second dose of 50 mg of protamine was administered during recovery. Patient was observed and hemostasis was maintained. Patient was discharged home in good condition.  PROCEDURE: Informed consent was obtained from the patient following explanation of the procedure,  risks, benefits and alternatives. The patient understands, agrees and consents for the procedure. All questions were addressed. A time out was performed prior to the initiation of the procedure. Maximal barrier sterile technique utilized including caps, mask, sterile gowns, sterile gloves, large sterile drape, hand hygiene, and Betadine prep.  The right common femoral artery was interrogated with ultrasound and found to be widely patent. An image was obtained and stored for the medical record. Local anesthesia was attained by infiltration with 1% lidocaine. A small dermatotomy was made. Under real-time sonographic guidance, the vessel was punctured with a 21 gauge micropuncture needle. Using standard technique, the initial micro needle was exchanged over a 0.018 micro wire for a transitional 4 [place] micro sheath. The micro sheath was then exchanged over a 0.035 wire for a 5 French vascular sheath.  An Omni flush catheter was advanced to the aortic bifurcation. A pelvic arteriogram was performed. Both common and external iliac arteries are widely patent. The internal iliac arteries are ectatic bilaterally.  The catheter was advanced up and over the aortic bifurcation and into the left common femoral artery. A multi station left lower extremity arteriogram was then performed. There is a near flush occlusion of the left superficial femoral artery which then reconstitutes via profunda collaterals in the distal thigh just above the knee. The popliteal artery is relatively spared from disease. The anterior tibial artery is occluded shortly beyond its origin. The tibioperoneal trunk, peroneal and posterior tibial arteries are widely patent.  An angled reinforced 5 French catheter was successfully navigated into the occluded origin of the superficial femoral artery. The chronic occlusion was crossed using a combination of hydrophilic and non hydrophilic 5.631 wires. Although we began  intraluminally, in order to extend the length of the occlusion the catheter was taken into the sub intimal space. There was difficulty re- entering the true lumen in the distal thigh. Therefore, the catheter was removed over a 0.014 Spartacore  wire and then out back re-entry device was advanced over the wire and positioned adjacent to the distal most superficial femoral artery. The out back device was used to puncture the distal SFA and Spartacore wire was successfully advanced into the popliteal artery. The out back device was removed. A tapered reinforced 5 French catheter was then successfully advanced over the 0.014 wire and into the popliteal artery. The wire was removed. Intraluminal location was confirmed by a gentle hand injection of contrast under fluoroscopy.  A 6 mm spider wire was then deployed in the distal popliteal artery. The entire superficial femoral artery was then angioplastied to 3.5 mm using a 3.5 x 220 mm [place] balloon. Contrast injection demonstrates restoration of a thin tract through the artery. It is clear that portions of the lumen are intraluminal, and portions are sub intimal. The first several cm pr intraluminal. Therefore, the decision was made to proceed with atherectomy debulking of the proximal atherosclerotic cap. A 7 French [place] 1 LX device was advanced over the wire and directional atherectomy was performed in 4 quadrants at the origin of the superficial femoral artery. Post atherectomy angioplasty demonstrates improved lumen.  The superficial femoral artery was then angioplastied to 5 mm using overlapping stations with a 5 x 100 mm sterling balloon. Post angiography demonstrates a region of persistent significant flow limitation in the distal superficial femoral artery at the re- entry site. The remaining tract is irregular but more widely patent. Therefore, a 6 x 100 mm Zilver PTX drug-eluting stent was deployed across the re- entry site. This  was then post dilated to 6 mm. The remainder the superficial femoral artery was then angioplastied using overlapping stations with 6 x 150 mm at neural impact drug coated balloons. Follow-up arteriography demonstrates a largely patent superficial femoral artery. However, there is a short segment region of flow limiting dissection in the mid thigh as well as a less significant dissection at the superficial femoral artery origin. The flow limiting dissection was treated by placement of a 6 x 80 mm Zilver PTX drug-eluting stent. The less significant dissection at the origin of the superficial femoral artery was treated with a prolonged low-pressure inflation with a 7 x 40 mm sterling balloon.  Follow-up angiography demonstrates no significant residual stenosis. There is brisk flow throughout the reconstructed superficial femoral artery. Minimal residual non flow limiting dissection present at the origin of the superficial femoral artery. This does not require treatment at this time. Runoff to the foot demonstrates no evidence of distal embolization or complication. The spider wire was captured and removed. The sheath was brought back into the right external iliac artery and a right common femoral arteriogram was performed confirming common femoral arterial access. Hemostasis was then attained with the assistance of a StarClose arterial clip. The patient tolerated the procedure well.  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  There is significantly improved in-line flow to the plantar foot wound which should provide adequate perfusion for healing.  Signed,  Criselda Peaches, Cody Pena  [facility]  [facility]   Electronically  Signed   By: Jacqulynn Cadet M.D.   On: 04/12/2017 15:26  Medical tests:     Decision to obtain old records:  As above, MRI, procedure reports obtained  Review and summation of old records:  As above  Scheduled  Meds: . aspirin EC  81 mg Oral Daily  . atorvastatin  80 mg Oral Daily  . citalopram  40 mg Oral Daily  . gabapentin  300 mg Oral TID  . heparin  5,000 Units Subcutaneous Q8H  . [START ON 11/12/2017] Influenza vac split quadrivalent PF  0.5 mL Intramuscular Tomorrow-1000  . insulin aspart  0-15 Units Subcutaneous Q6H  . insulin glargine  50 Units Subcutaneous QHS  . nicotine  21 mg Transdermal Daily  . [START ON 11/12/2017] pneumococcal 23 valent vaccine  0.5 mL Intramuscular Tomorrow-1000  . polyethylene glycol  17 g Oral Daily  . sodium chloride flush  3 mL Intravenous Q12H   Continuous Infusions:  Principal Problem:   Septic arthritis of foot (HCC) Active Problems:   Diabetic foot infection (HCC)   Osteomyelitis of ankle or foot, acute, left (HCC)   Type 2 DM with diabetic peripheral angiopathy w/o gangrene (HCC)   Cigarette smoker   Diabetic peripheral neuropathy ([place])   LOS: 1 day

## 2017-11-12 ENCOUNTER — Encounter (HOSPITAL_COMMUNITY): Payer: 59

## 2017-11-12 DIAGNOSIS — M86072 Acute hematogenous osteomyelitis, left ankle and foot: Secondary | ICD-10-CM

## 2017-11-12 LAB — GLUCOSE, CAPILLARY
GLUCOSE-CAPILLARY: 146 mg/dL — AB (ref 65–99)
Glucose-Capillary: 167 mg/dL — ABNORMAL HIGH (ref 65–99)
Glucose-Capillary: 135 mg/dL — ABNORMAL HIGH (ref 65–99)
Glucose-Capillary: 170 mg/dL — ABNORMAL HIGH (ref 65–99)

## 2017-11-12 MED ORDER — ADULT MULTIVITAMIN W/MINERALS CH
1.0000 | ORAL_TABLET | Freq: Every day | ORAL | Status: DC
  Administered 2017-11-12 – 2017-11-16 (×5): 1 via ORAL
  Filled 2017-11-12 (×5): qty 1

## 2017-11-12 MED ORDER — INSULIN ASPART 100 UNIT/ML ~~LOC~~ SOLN
0.0000 [IU] | Freq: Three times a day (TID) | SUBCUTANEOUS | Status: DC
  Administered 2017-11-12 – 2017-11-14 (×3): 2 [IU] via SUBCUTANEOUS
  Administered 2017-11-15 (×2): 8 [IU] via SUBCUTANEOUS
  Administered 2017-11-15: 3 [IU] via SUBCUTANEOUS
  Administered 2017-11-16 (×2): 5 [IU] via SUBCUTANEOUS

## 2017-11-12 MED ORDER — ENSURE ENLIVE PO LIQD
237.0000 mL | Freq: Two times a day (BID) | ORAL | Status: DC
  Administered 2017-11-12 – 2017-11-16 (×8): 237 mL via ORAL

## 2017-11-12 NOTE — Progress Notes (Signed)
Supervising Physician: Gilmer Mor  Patient Status:  [facility] - In-pt  Chief Complaint: Peripheral vascular disease  Subjective: Resting comfortably.  No new complaints.   Allergies: Patient has no known allergies.  Medications: Prior to Admission medications   Medication Sig Start Date End Date Taking? Authorizing Provider  citalopram (CELEXA) 40 MG tablet Take 40 mg by mouth daily.   Yes [provider]  Dulaglutide (TRULICITY [place]) Inject 1 Device into the skin every 7 (seven) days.    Yes [provider]  gabapentin (NEURONTIN) 600 MG tablet Take 600 mg by mouth daily.    Yes [provider]  HYDROcodone-acetaminophen (NORCO) 10-325 MG per tablet Take 1 tablet by mouth every 4 (four) hours as needed for moderate pain.    Yes [provider]  Insulin Glargine (BASAGLAR KWIKPEN [place]) Inject 80 Units into the skin at bedtime.    Yes [provider]  irbesartan (AVAPRO) 150 MG tablet Take 150 mg by mouth daily.   Yes [provider]  Vitamin D, Ergocalciferol, (DRISDOL) 50000 units CAPS capsule Take 50,000 Units by mouth every 7 (seven) days.   Yes [provider]  atorvastatin (LIPITOR) 80 MG tablet Take 80 mg by mouth daily.    [provider]     Vital Signs: BP (!) 100/54 (BP Location: Right Arm)   Pulse 79   Temp 98.2 F (36.8 C) (Oral)   Resp 16   Ht 6\' 3"  (1.905 m)   Wt 233 lb 14.5 oz (106.1 kg)   SpO2 97%   BMI 29.24 kg/m   Physical Exam  Constitutional: He is oriented to person, place, and time. He appears well-developed.  Cardiovascular: Normal rate, regular rhythm and normal heart sounds.  Pulmonary/Chest: Effort normal and breath sounds normal. No respiratory distress.  Musculoskeletal: Normal range of motion.  Discoloration of 2nd and 3rd toe  Neurological: He is alert and oriented to person, place, and time.  Psychiatric: He has a normal mood and affect. His behavior is normal. Judgment  and thought content normal.  Nursing note and vitals reviewed.    Imaging: Dg Foot Complete Left  Result Date: 11/10/2017 CLINICAL DATA:  Diabetic left foot infection. EXAM: LEFT FOOT - COMPLETE 3+ VIEW COMPARISON:  05/22/2017 left foot radiographs FINDINGS: Plantar distal left foot soft tissue ulcer at the level of second and third MTP joints. Cortical erosions are present at proximal aspects of the proximal phalanges in the second and third toes. There is a fracture subluxation of the base of the proximal phalanx in the left second toe. No additional cortical erosions. No periosteal reaction. No additional fractures. No additional dislocation. Mild degenerative change at the first tarsometatarsal joint. No radiopaque foreign body. Soft tissue swelling throughout the mid to distal left foot. IMPRESSION: Plantar distal left foot soft tissue ulcer at the level of the second and third MTP joints. Evidence of acute osteomyelitis involving proximal aspects of the proximal phalanges of the second and third toes with cortical erosions. Fracture-subluxation of the base of the proximal phalanx in the left second toe. Electronically Signed   By: Delbert Phenix M.D.   On: 11/10/2017 20:47    Labs:  CBC: Recent Labs    11/08/17 1852 11/10/17 1815 11/11/17 0457  WBC 12.6* 13.7* 11.2*  HGB 15.0 15.1 13.5  HCT 44.9 44.8 40.5  PLT 242 310 273    COAGS: Recent Labs    11/08/17 1852 11/10/17 1815  INR 1.08 1.05  BMP: Recent Labs    11/08/17 1852 11/10/17 1815 11/11/17 0457  NA 135 137 137  K 4.0 4.3 3.9  CL 97* 99* 101  CO2 27 28 26   GLUCOSE 126* 118* 108*  BUN 11 9 8   CALCIUM 9.2 9.1 8.6*  CREATININE 0.91 0.88 0.85  GFRNONAA >60 >60 >60  GFRAA >60 >60 >60    LIVER FUNCTION TESTS: Recent Labs    11/08/17 1852 11/10/17 1815 11/11/17 0457  BILITOT 0.4 0.4 0.4  AST 16 16 15   ALT 22 21 18   ALKPHOS 78 78 74  PROT 7.4 7.5 6.1*  ALBUMIN 3.3* 3.4* 2.9*    Assessment and  Plan: Peripheral vascular disease Patient s/p atherectomy with PTA and stenting of SFA in August 2018.  Now with non-healing foot wound requiring amputation.  Dr. Archer AsaMcCullough has discussed with Dr. Lajoyce Cornersuda and current plan is for angiogram with possible intervention on Thursday.  Patient will then proceed to OR on Friday.  Spoke with patient regarding plan and he is agreeable.  SCr 0.85 INR 3/31 1.05 Risks and benefits were discussed with the patient including, but not limited to bleeding, infection, vascular injury or contrast induced renal failure.  This interventional procedure involves the use of X-rays and because of the nature of the planned procedure, it is possible that we will have prolonged use of X-ray fluoroscopy.  Potential radiation risks to you include (but are not limited to) the following: - A slightly elevated risk for cancer  several years later in life. This risk is typically less than 0.5% percent. This risk is low in comparison to the normal incidence of human cancer, which is 33% for women and 50% for men according to the American Cancer Society. - Radiation induced injury can include skin redness, resembling a rash, tissue breakdown / ulcers and hair loss (which can be temporary or permanent).   The likelihood of either of these occurring depends on the difficulty of the procedure and whether you are sensitive to radiation due to previous procedures, disease, or genetic conditions.   IF your procedure requires a prolonged use of radiation, you will be notified and given written instructions for further action.  It is your responsibility to monitor the irradiated area for the 2 weeks following the procedure and to notify your physician if you are concerned that you have suffered a radiation induced injury.    All of the patient's questions were answered, patient is agreeable to proceed.  Consent signed and in chart.  Electronically Signed: Hoyt KochKacie Sue-Ellen Jazma Pickel,  PA 11/12/2017, 4:53 PM   I spent a total of 25 Minutes at the the patient's bedside AND on the patient's hospital floor or unit, greater than 50% of which was counseling/coordinating care for peripheral vascular disease.

## 2017-11-12 NOTE — Progress Notes (Signed)
PROGRESS NOTE  AHMAAD NEIDHARDT MCN:[id number] DOB: Feb 14, 1961 DOA: 11/10/2017 PCP: Tamsen Roers, MD  Brief Narrative: 57 year old man PMH diabetes mellitus with peripheral neuropathy, followed by wound clinic for left foot wound, sent to the emergency department after outpatient MRI revealed septic arthritis and osteomyelitis of the left foot.  Assessment/Plan Septic arthritis of the second MTP joint with osteomyelitis of the second metatarsal head and proximal phalanx, septic arthritis of third MTP joint, infectious intermetatarsal bursitis between the second and third metatarsal heads in the setting of chronic left foot wound.  (See MRI report below from [facility]) --Antibiotics on hold pending surgical evaluation.  Minimal leukocytosis.  Afebrile.  No evidence of systemic infection at this point. --ABIs pending.  Vascular surgery consultation placed and they have consulted Ortho for patient's foot infection and plan will be for transmetatarsal amputation for treatment of his osteomyelitis. Plan is for operation 11/13/17   Peripheral vascular disease. S/p IR ATHERECTOMY W/ PTA AND STENT, Left lower extremity multi station arteriogram, Atherectomy, angioplasty and stent placement in the superficial femoral artery 04/12/2017 by IR at [facility] (see report below). ABI report 05/2017 (see below) showed Interval normalization of the resting and post exercise left ankle brachial index consistent with reconstruction of the previously occluded left superficial femoral artery. Persistent right lower extremity occlusive peripheral arterial disease of a mild severity at rest and moderate severity following exercise. --Plan as above.  No evidence to suggest acute ischemia. --Continue statin and aspirin. --On Plavix as an outpatient after stents placed.  Discussed with Dr. Laurence Ferrari interventional radiology, in regard to stent placement, no indication for Plavix at this point, okay to hold. IR consulted and feel  that ulceration most likely due to DM with small vessel dx and is left SFA stet likely patent   Diabetes mellitus type 2 with peripheral neuropathy, peripheral vascular disease --Stable.  Continue gabapentin, basal insulin, sliding scale insulin  Essential hypertension --Stable.  Resume irbesartan on discharge.  Cigarette smoker --revisit cessation on d/c  DVT prophylaxis: heparin, SCDs Code Status: full  Family Communication: none Disposition Plan: home    Velvet Bathe, MD  [facility] Direct contact: [phone] --Via amion app OR  --[email or web address]; password [email or web address]  7PM-7AM contact night coverage as above 11/12/2017, 4:22 PM  LOS: 2 days   Consultants:  VVS  IR by phone  Procedures:    Antimicrobials:  Defer recommendations to ortho  Interval history/Subjective: He has no new complaints currently  Objective: Vitals:  Vitals:   11/12/17 1100 11/12/17 1400  BP: 107/68 (!) 100/54  Pulse: 75 79  Resp: 17 16  Temp:  98.2 F (36.8 C)  SpO2: 96% 97%    Exam:  Constitutional:  . Appears calm and comfortable, in nad. Eyes:  . pupils and irises appear normal ENMT:  . grossly normal hearing  . Lips appear normal Respiratory:  . CTA bilaterally, no w/r/r.  . Respiratory effort normal No wheezes on exam  Cardiovascular:  . RRR, no m/r/g . No LE extremity edema    Musculoskeletal:  . RLE o strength and tone normal, no atrophy, no abnormal movements o No tenderness, masses Skin:  . Large open plantar wound left foot, packed.  Surrounding skin appears unremarkable.  Dorsum of the foot appears unremarkable.  Toes appear to be perfused. . palpation of skin: no induration or nodules Psychiatric:  . Mental status o Mood, affect appropriate  I have personally reviewed the following:   Labs:  Basic metabolic panel unremarkable.  Hepatic  function panel unremarkable.  WBC 11.2.  Remainder CBC unremarkable.  CRP 3.2  ESR 60  Hemoglobin A1c  7.2  Imaging studies:  MRI 11/08/2017 Exam(s): [id number] MRI/MRI FOOT LEFT W/WO CLINICAL DATA:  Diabetic foot ulcer for 1 year  EXAM: MRI OF THE LEFT FOREFOOT WITHOUT AND WITH CONTRAST  TECHNIQUE: Multiplanar, multisequence MR imaging of the left forefoot was performed both before and after administration of intravenous contrast.  CONTRAST:  20 mL MultiHance  COMPARISON:  None.  FINDINGS: Bones/Joint/Cartilage  Soft tissue ulcer along the plantar aspect of the second MTP joint. Bone destruction of the plantar aspect of the second metatarsal head with severe marrow edema in the second metatarsal head and shaft. Cortical irregularity along the plantar aspect of the base of the second proximal phalanx with severe surrounding marrow edema and enhancement. Moderate second MTP joint effusion with synovitis. Fluid in the intermetatarsal bursa between the second and third metatarsal heads with peripheral enhancement most concerning for infectious bursitis. Dorsal subluxation of the second proximal phalanx relative to the metatarsal head.  Nonspecific marrow edema with corresponding T1 hypointensity in the medial aspect of the third metatarsal head and along the base of third proximal phalanx with enhancement. Moderate third MTP joint effusion with synovitis.  No acute fracture or dislocation.  Alignment is otherwise normal.  Ligaments  Collateral ligaments are intact.  Lisfranc ligament is intact.  Muscles and Tendons Flexor, peroneal and extensor compartment tendons are intact. No muscle atrophy. Generalized T2 hyperintense signal throughout the plantar musculature likely neurogenic.  Soft tissue No other fluid collection or hematoma.  No soft tissue mass.  IMPRESSION: 1. Soft tissue ulcer along the plantar aspect of the second MTP joint. Septic arthritis of the second MTP joint with osteomyelitis of the second metatarsal head and proximal phalanx. 2. Septic arthritis  of third MTP joint. 3. Infectious intermetatarsal bursitis between the second and third metatarsal heads.  05/2017 ABI Exam(s): [id number] US/US:ANKLE/BRACH INDICIES w/EXER CLINICAL DATA:  57 year old male with left lower extremity critical limb ischemia now 1 month status post revascularization. Re-evaluate ankle-brachial indices in establish a new baseline.  EXAM: NONINVASIVE PHYSIOLOGIC VASCULAR STUDY OF BILATERAL LOWER EXTREMITIES WITH AND WITHOUT EXERCISE  TECHNIQUE: Evaluation of both lower extremities were performed at rest, including calculation of ankle-brachial indices, multiple segmental pressure evaluation, segmental Doppler and segmental pulse volume recording. Ankle brachial indices were also obtained following treadmill exercise.  COMPARISON:  Ankle-brachial indices preprocedure dated 03/27/2017  FINDINGS: RESTING  Right ABI: 0.84  Left ABI: 1.0  Right lower extremity: Triphasic arterial waveforms in the posterior tibial artery. The dorsalis pedis artery is a blunted monophasic waveform.  Left lower extremity: Triphasic arterial waveforms in the posterior tibial artery. The dorsalis pedis artery is a blunted monophasic waveform.  POST EXERCISE  Right ABI: 0.5  Left ABI: 1.0  IMPRESSION: 1. Interval normalization of the resting and post exercise left ankle brachial index consistent with reconstruction of the previously occluded left superficial femoral artery. 2. Persistent right lower extremity occlusive peripheral arterial disease of a mild severity at rest and moderate severity following exercise.  Signed,  Criselda Peaches, MD  [facility]  [facility] Radiology   Electronically Signed   By: Jacqulynn Cadet M.D.   On: 05/14/2017 10:38  04/12/17 Exam(s): [id number] VAS/ATHERECTOMY W/ PTA INDICATION: 57 year old male with a history of Rutherford category 5 critical limb ischemia involving the  plantar surface of his left foot. 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 He presents today for attempted revascularization of the superficial femoral artery.  EXAM: IR ATHERECTOMY W/ PTA AND STENT  1. Ultrasound-guided puncture right common femoral artery 2. Left lower extremity multi station arteriogram 3. Atherectomy, angioplasty and stent placement in the superficial femoral artery  MEDICATIONS: 2 g Ancef. The antibiotic was administered within 1 hour of the procedure. Additionally, 24000 units heparin was administered intravenously. 50 mg of protamine were administered intravenously at the end of the case.  ANESTHESIA/SEDATION: Moderate (conscious) sedation was employed during this procedure. A total of Versed 4 mg and Fentanyl 200 mcg was administered intravenously.  Moderate Sedation Time: 210 minutes. The patient's level of consciousness and vital signs were monitored continuously by radiology nursing throughout the procedure under my direct supervision.  CONTRAST:  200 mL Visipaque 320  FLUOROSCOPY TIME:  Fluoroscopy Time: 44 minutes 0 seconds.  COMPLICATIONS: SIR LEVEL B - Normal therapy, includes overnight admission for observation.  The patient was initially heparinized with a bolus of 9000 units of heparin (patient weight is 240 pounds, this is a fairly standard dose of approximately 90 units per kg). Approximately 1 hour and 20 minutes into the case, an additional 1500 units of heparin was ordered. However, there was an accidental miss administration of 20254 units of heparin. 50 mg of protamine was administered.  Following cessation of the case, a PTT was ordered and came back at 63 which remains consistent with therapeutic anticoagulation. Therefore, a second dose of 50 mg of protamine was administered during recovery. Patient was observed and hemostasis  was maintained. Patient was discharged home in good condition.  PROCEDURE: Informed consent was obtained from the patient following explanation of the procedure, risks, benefits and alternatives. The patient understands, agrees and consents for the procedure. All questions were addressed. A time out was performed prior to the initiation of the procedure. Maximal barrier sterile technique utilized including caps, mask, sterile gowns, sterile gloves, large sterile drape, hand hygiene, and Betadine prep.  The right common femoral artery was interrogated with ultrasound and found to be widely patent. An image was obtained and stored for the medical record. Local anesthesia was attained by infiltration with 1% lidocaine. A small dermatotomy was made. Under real-time sonographic guidance, the vessel was punctured with a 21 gauge micropuncture needle. Using standard technique, the initial micro needle was exchanged over a 0.018 micro wire for a transitional 4 [place] micro sheath. The micro sheath was then exchanged over a 0.035 wire for a 5 French vascular sheath.  An Omni flush catheter was advanced to the aortic bifurcation. A pelvic arteriogram was performed. Both common and external iliac arteries are widely patent. The internal iliac arteries are ectatic bilaterally.  The catheter was advanced up and over the aortic bifurcation and into the left common femoral artery. A multi station left lower extremity arteriogram was then performed. There is a near flush occlusion of the left superficial femoral artery which then reconstitutes via profunda collaterals in the distal thigh just above the knee. The popliteal artery is relatively spared from disease. The anterior tibial artery is occluded shortly beyond its origin. The tibioperoneal trunk, peroneal and posterior tibial arteries are widely patent.  An angled reinforced 5 French catheter was successfully navigated into the occluded  origin of the superficial femoral artery. The chronic occlusion was crossed using a combination of hydrophilic and non hydrophilic 2.706 wires. Although we began intraluminally, in order to  extend the length of the occlusion the catheter was taken into the sub intimal space. There was difficulty re- entering the true lumen in the distal thigh. Therefore, the catheter was removed over a 0.014 Spartacore wire and then out back re-entry device was advanced over the wire and positioned adjacent to the distal most superficial femoral artery. The out back device was used to puncture the distal SFA and Spartacore wire was successfully advanced into the popliteal artery. The out back device was removed. A tapered reinforced 5 French catheter was then successfully advanced over the 0.014 wire and into the popliteal artery. The wire was removed. Intraluminal location was confirmed by a gentle hand injection of contrast under fluoroscopy.  A 6 mm spider wire was then deployed in the distal popliteal artery. The entire superficial femoral artery was then angioplastied to 3.5 mm using a 3.5 x 220 mm [place]ote balloon. Contrast injection demonstrates restoration of a thin tract through the artery. It is clear that portions of the lumen are intraluminal, and portions are sub intimal. The first several cm pr intraluminal. Therefore, the decision was made to proceed with atherectomy debulking of the proximal atherosclerotic cap. A 7 French [place] 1 LX device was advanced over the wire and directional atherectomy was performed in 4 quadrants at the origin of the superficial femoral artery. Post atherectomy angioplasty demonstrates improved lumen.  The superficial femoral artery was then angioplastied to 5 mm using overlapping stations with a 5 x 100 mm sterling balloon. Post angiography demonstrates a region of persistent significant flow limitation in the distal superficial femoral artery at the re-  entry site. The remaining tract is irregular but more widely patent. Therefore, a 6 x 100 mm Zilver PTX drug-eluting stent was deployed across the re- entry site. This was then post dilated to 6 mm. The remainder the superficial femoral artery was then angioplastied using overlapping stations with 6 x 150 mm at neural impact drug coated balloons. Follow-up arteriography demonstrates a largely patent superficial femoral artery. However, there is a short segment region of flow limiting dissection in the mid thigh as well as a less significant dissection at the superficial femoral artery origin. The flow limiting dissection was treated by placement of a 6 x 80 mm Zilver PTX drug-eluting stent. The less significant dissection at the origin of the superficial femoral artery was treated with a prolonged low-pressure inflation with a 7 x 40 mm sterling balloon.  Follow-up angiography demonstrates no significant residual stenosis. There is brisk flow throughout the reconstructed superficial femoral artery. Minimal residual non flow limiting dissection present at the origin of the superficial femoral artery. This does not require treatment at this time. Runoff to the foot demonstrates no evidence of distal embolization or complication. The spider wire was captured and removed. The sheath was brought back into the right external iliac artery and a right common femoral arteriogram was performed confirming common femoral arterial access. Hemostasis was then attained with the assistance of a StarClose arterial clip. The patient tolerated the procedure well.  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  There is significantly improved in-line flow to the plantar foot wound which should provide  adequate perfusion for healing.  Signed,  Criselda Peaches, MD  [facility]  [facility]iology   Electronically Signed   By: Dellis Filbert.D.  On: 04/12/2017 15:26  Medical tests:     Decision to obtain old records:  As above, MRI, procedure reports obtained  Review and summation of old records:  As above  Scheduled Meds: . aspirin EC  81 mg Oral Daily  . atorvastatin  80 mg Oral Daily  . citalopram  40 mg Oral Daily  . feeding supplement (ENSURE ENLIVE)  237 mL Oral BID BM  . gabapentin  300 mg Oral TID  . heparin  5,000 Units Subcutaneous Q8H  . insulin aspart  0-15 Units Subcutaneous Q6H  . insulin glargine  50 Units Subcutaneous QHS  . multivitamin with minerals  1 tablet Oral Daily  . nicotine  21 mg Transdermal Daily  . polyethylene glycol  17 g Oral Daily  . sodium chloride flush  3 mL Intravenous Q12H   Continuous Infusions:  Principal Problem:   Septic arthritis of foot (HCC) Active Problems:   Diabetic foot infection (HCC)   Osteomyelitis of ankle or foot, acute, left (HCC)   Type 2 DM with diabetic peripheral angiopathy w/o gangrene (HCC)   Cigarette smoker   Diabetic peripheral neuropathy ([place])   LOS: 2 days

## 2017-11-13 DIAGNOSIS — F17210 Nicotine dependence, cigarettes, uncomplicated: Secondary | ICD-10-CM

## 2017-11-13 LAB — GLUCOSE, CAPILLARY
GLUCOSE-CAPILLARY: 108 mg/dL — AB (ref 65–99)
Glucose-Capillary: 109 mg/dL — ABNORMAL HIGH (ref 65–99)
Glucose-Capillary: 143 mg/dL — ABNORMAL HIGH (ref 65–99)
Glucose-Capillary: 140 mg/dL — ABNORMAL HIGH (ref 65–99)

## 2017-11-13 LAB — CULTURE, BLOOD (ROUTINE X 2)
Culture: NO GROWTH
SPECIAL REQUESTS: ADEQUATE

## 2017-11-13 NOTE — Plan of Care (Signed)
  Problem: Education: Goal: Knowledge of General Education information will improve Outcome: Progressing Note:  POC reviewed with pt.; pt. informed of being NPO for Angiogram.

## 2017-11-13 NOTE — Progress Notes (Signed)
RN did dressing change to pt's left foot ulcer. Pt did not have any drainage or foul odor. Cleansed with normal saline pat dried, placed a piece of Aquacel inside the wound and covered with 4x4 and kerlix. Pt did not complain of any pain or discomfort. Pain medication given prior.

## 2017-11-13 NOTE — Progress Notes (Signed)
PROGRESS NOTE  Cody CarrelRobert K Boord ZOX:[id number] DOB: [birth year] DOA: 11/10/2017 PCP: Aida PufferLittle, James, MD  HPI/Recap of past 5824 hours: 57 year old man PMH diabetes mellitus with peripheral neuropathy, followed by wound clinic for left foot wound, sent to the emergency department after outpatient MRI revealed septic arthritis and osteomyelitis of the left foot.  11/13/2017: Patient seen and examined at his bedside.  He states the pain on his left dorsal foot is well controlled on current pain medications.  He has no new complaints.  Assessment/Plan: Principal Problem:   Septic arthritis of foot (HCC) Active Problems:   Diabetic foot infection (HCC)   Osteomyelitis of ankle or foot, acute, left (HCC)   Type 2 DM with diabetic peripheral angiopathy w/o gangrene (HCC)   Cigarette smoker   Diabetic peripheral neuropathy (HCC)  Left dorsal foot wound/osteomyelitis Antibiotics on hold pending surgical evaluation Vascular surgery consultation placed to see if surgery could be  avoided Continue current pain management Possible left second metatarsal amputation. Orthopedic surgery following Repeat CBC in the morning  Diabetes uncontrolled due to hypoglycemia Last A1c was 7.2 on 11/10/2017 Continue insulin sliding scale Avoid hypoglycemia or hyperglycemia  Peripheral vascular disease status post IR atherectomy with PTA and stent Continue statin and aspirin  Hypertension Blood pressure stable  Tobacco use disorder Tobacco cessation counseling at bedside     Code Status: Full code  Family Communication: None at bedside  Disposition Plan: SNF when clinically stable   Consultants:  Orthopedic surgery Procedures:  None  Antimicrobials:  None  DVT prophylaxis: SCDs   Objective: Vitals:   11/12/17 1400 11/12/17 1900 11/13/17 0455 11/13/17 1300  BP: (!) 100/54 121/68 113/68 107/62  Pulse: 79 86 77 83  Resp: 16 16 17 17   Temp: 98.2 F (36.8 C) 98.2 F (36.8 C) 98 F  (36.7 C) 98 F (36.7 C)  TempSrc: Oral Oral Oral Oral  SpO2: 97% 100% 99% 95%  Weight:      Height:        Intake/Output Summary (Last 24 hours) at 11/13/2017 1509 Last data filed at 11/13/2017 0900 Gross per 24 hour  Intake 240 ml  Output 800 ml  Net -560 ml   Filed Weights   11/10/17 1753 11/11/17 1212  Weight: 111.1 kg (245 lb) 106.1 kg (233 lb 14.5 oz)    Exam:   General:57 year old Caucasian male well-developed well-nourished in no acute distress.  Alert and oriented x3.  Cardiovascular: Regular rate and rhythm with no rubs or gallops.  No JVD or thyromegaly noted.  Respiratory: Clear to auscultation with no wheezes or rales.  Abdomen: Soft nontender  Normal bowel sounds x4 quadrant.  Musculoskeletal: Left dorsal foot with deep wound  Skin: As stated above  Psychiatry: Mood is appropriate for condition and setting   Data Reviewed: CBC: Recent Labs  Lab 11/08/17 1852 11/10/17 1815 11/11/17 0457  WBC 12.6* 13.7* 11.2*  NEUTROABS 8.2* 8.9*  --   HGB 15.0 15.1 13.5  HCT 44.9 44.8 40.5  MCV 95.3 93.9 94.2  PLT 242 310 273   Basic Metabolic Panel: Recent Labs  Lab 11/08/17 1852 11/10/17 1815 11/11/17 0457  NA 135 137 137  K 4.0 4.3 3.9  CL 97* 99* 101  CO2 27 28 26   GLUCOSE 126* 118* 108*  BUN 11 9 8   CREATININE 0.91 0.88 0.85  CALCIUM 9.2 9.1 8.6*   GFR: Estimated Creatinine Clearance: 124.7 mL/min (by C-G formula based on SCr of 0.85 mg/dL). Liver Function Tests: Recent Labs  Lab 11/08/17 1852 11/10/17 1815 11/11/17 0457  AST 16 16 15   ALT 22 21 18   ALKPHOS 78 78 74  BILITOT 0.4 0.4 0.4  PROT 7.4 7.5 6.1*  ALBUMIN 3.3* 3.4* 2.9*   No results for input(s): LIPASE, AMYLASE in the last 168 hours. No results for input(s): AMMONIA in the last 168 hours. Coagulation Profile: Recent Labs  Lab 11/08/17 1852 11/10/17 1815  INR 1.08 1.05   Cardiac Enzymes: No results for input(s): CKTOTAL, CKMB, CKMBINDEX, TROPONINI in the last 168  hours. BNP (last 3 results) No results for input(s): PROBNP in the last 8760 hours. HbA1C: Recent Labs    11/10/17 2136  HGBA1C 7.2*   CBG: Recent Labs  Lab 11/12/17 1159 11/12/17 1644 11/12/17 2117 11/13/17 0652 11/13/17 1124  GLUCAP 135* 170* 146* 109* 143*   Lipid Profile: No results for input(s): CHOL, HDL, LDLCALC, TRIG, CHOLHDL, LDLDIRECT in the last 72 hours. Thyroid Function Tests: No results for input(s): TSH, T4TOTAL, FREET4, T3FREE, THYROIDAB in the last 72 hours. Anemia Panel: No results for input(s): VITAMINB12, FOLATE, FERRITIN, TIBC, IRON, RETICCTPCT in the last 72 hours. Urine analysis:    Component Value Date/Time   COLORURINE YELLOW 12/07/2014 0325   APPEARANCEUR CLEAR 12/07/2014 0325   LABSPEC 1.023 12/07/2014 0325   PHURINE 5.5 12/07/2014 0325   GLUCOSEU 500 (A) 12/07/2014 0325   HGBUR NEGATIVE 12/07/2014 0325   BILIRUBINUR NEGATIVE 12/07/2014 0325   KETONESUR NEGATIVE 12/07/2014 0325   PROTEINUR NEGATIVE 12/07/2014 0325   UROBILINOGEN 1.0 12/07/2014 0325   NITRITE NEGATIVE 12/07/2014 0325   LEUKOCYTESUR NEGATIVE 12/07/2014 0325   Sepsis Labs: @LABRCNTIP (procalcitonin:4,lacticidven:4)  ) Recent Results (from the past 240 hour(s))  Culture, blood (Routine x 2)     Status: None   Collection Time: 11/08/17  6:50 PM  Result Value Ref Range Status   Specimen Description BLOOD RIGHT ANTECUBITAL  Final   Special Requests   Final    BOTTLES DRAWN AEROBIC AND ANAEROBIC Blood Culture adequate volume   Culture   Final    NO GROWTH 5 DAYS Performed at [facility], [address], [place], [place] [address]    Report Status 11/13/2017 FINAL  Final  Culture, blood (Routine x 2)     Status: None (Preliminary result)   Collection Time: 11/10/17  6:15 PM  Result Value Ref Range Status   Specimen Description BLOOD RIGHT ANTECUBITAL  Final   Special Requests   Final    BOTTLES DRAWN AEROBIC AND ANAEROBIC Blood Culture adequate volume   Culture    Final    NO GROWTH 3 DAYS Performed at [facility], [address] [place], [place] [address]    Report Status PENDING  Incomplete  Culture, blood (Routine x 2)     Status: None (Preliminary result)   Collection Time: 11/10/17  7:50 PM  Result Value Ref Range Status   Specimen Description BLOOD LEFT ANTECUBITAL  Final   Special Requests IN PEDIATRIC BOTTLE Blood Culture adequate volume  Final   Culture   Final    NO GROWTH 3 DAYS Performed at [facility], [address] [place], [place] [address]    Report Status PENDING  Incomplete  Wound or Superficial Culture     Status: None (Preliminary result)   Collection Time: 11/10/17  8:41 PM  Result Value Ref Range Status   Specimen Description WOUND LEFT FOOT  Final   Special Requests NONE  Final   Gram Stain  Final    MODERATE WBC PRESENT, PREDOMINANTLY PMN RARE GRAM POSITIVE COCCI    Culture   Final    CULTURE REINCUBATED FOR BETTER GROWTH Performed at [facility], [address] [place], [place] [address]    Report Status PENDING  Incomplete      Studies: No results found.  Scheduled Meds: . aspirin EC  81 mg Oral Daily  . atorvastatin  80 mg Oral Daily  . citalopram  40 mg Oral Daily  . feeding supplement (ENSURE ENLIVE)  237 mL Oral BID BM  . gabapentin  300 mg Oral TID  . heparin  5,000 Units Subcutaneous Q8H  . insulin aspart  0-15 Units Subcutaneous TID WC & HS  . insulin glargine  50 Units Subcutaneous QHS  . multivitamin with minerals  1 tablet Oral Daily  . nicotine  21 mg Transdermal Daily  . polyethylene glycol  17 g Oral Daily  . sodium chloride flush  3 mL Intravenous Q12H    Continuous Infusions:   LOS: 3 days     Darlin Drop, MD [facility] Pager [phone]  If 7PM-7AM, please contact night-coverage [email or web address] Password [email or web address] 11/13/2017, 3:09 PM

## 2017-11-14 ENCOUNTER — Other Ambulatory Visit (INDEPENDENT_AMBULATORY_CARE_PROVIDER_SITE_OTHER): Payer: Self-pay | Admitting: Orthopedic Surgery

## 2017-11-14 ENCOUNTER — Inpatient Hospital Stay (HOSPITAL_COMMUNITY): Payer: 59

## 2017-11-14 DIAGNOSIS — M869 Osteomyelitis, unspecified: Secondary | ICD-10-CM

## 2017-11-14 DIAGNOSIS — M86272 Subacute osteomyelitis, left ankle and foot: Secondary | ICD-10-CM

## 2017-11-14 HISTORY — PX: IR ANGIOGRAM SELECTIVE EACH ADDITIONAL VESSEL: IMG667

## 2017-11-14 HISTORY — PX: IR TIB-PERO ART UNI PTA EA ADD VESSEL MOD SED: IMG2317

## 2017-11-14 HISTORY — PX: IR ANGIOGRAM EXTREMITY LEFT: IMG651

## 2017-11-14 HISTORY — PX: IR US GUIDE VASC ACCESS LEFT: IMG2389

## 2017-11-14 HISTORY — PX: IR TIB-PERO ART PTA MOD SED: IMG2313

## 2017-11-14 LAB — COMPREHENSIVE METABOLIC PANEL
ALK PHOS: 71 U/L (ref 38–126)
ALT: 17 U/L (ref 17–63)
ANION GAP: 9 (ref 5–15)
AST: 15 U/L (ref 15–41)
Albumin: 2.9 g/dL — ABNORMAL LOW (ref 3.5–5.0)
BUN: 11 mg/dL (ref 6–20)
CALCIUM: 8.6 mg/dL — AB (ref 8.9–10.3)
CO2: 29 mmol/L (ref 22–32)
Chloride: 97 mmol/L — ABNORMAL LOW (ref 101–111)
Creatinine, Ser: 0.92 mg/dL (ref 0.61–1.24)
GFR calc non Af Amer: >60 mL/min (ref >60)
GLUCOSE: 121 mg/dL — AB (ref 65–99)
Potassium: 4.3 mmol/L (ref 3.5–5.1)
SODIUM: 135 mmol/L (ref 135–145)
Total Bilirubin: 0.7 mg/dL (ref 0.3–1.2)
Total Protein: 6.3 g/dL — ABNORMAL LOW (ref 6.5–8.1)

## 2017-11-14 LAB — AEROBIC CULTURE W GRAM STAIN (SUPERFICIAL SPECIMEN): Culture: NORMAL

## 2017-11-14 LAB — CBC
HCT: 38.8 % — ABNORMAL LOW (ref 39.0–52.0)
HEMOGLOBIN: 12.8 g/dL — AB (ref 13.0–17.0)
MCH: 30.9 pg (ref 26.0–34.0)
MCHC: 33 g/dL (ref 30.0–36.0)
MCV: 93.7 fL (ref 78.0–100.0)
PLATELETS: 272 10*3/uL (ref 150–400)
RBC: 4.14 MIL/uL — AB (ref 4.22–5.81)
RDW: 13.1 % (ref 11.5–15.5)
WBC: 9.6 10*3/uL (ref 4.0–10.5)

## 2017-11-14 LAB — AEROBIC CULTURE  (SUPERFICIAL SPECIMEN)

## 2017-11-14 LAB — GLUCOSE, CAPILLARY: Glucose-Capillary: 223 mg/dL — ABNORMAL HIGH (ref 65–99)

## 2017-11-14 IMAGING — US IR [PERSON_NAME] PTA EA ADD VESSEL
2 of 3 series · 11 of 24 positions shown · non-contrast
Comparison: none

INDICATION: 56-year-old male with a history of critical limb ischemia left foot.

[Series 85: extr.4 care · 2 of 62 frames shown]
[frame 10/62]
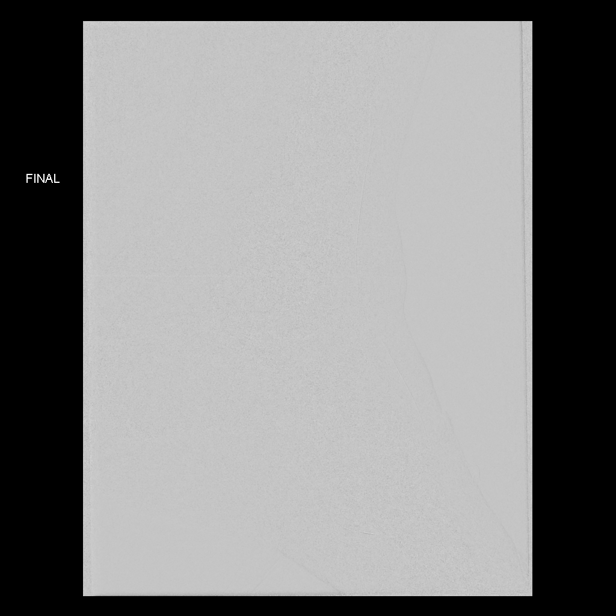
[frame 53/62]
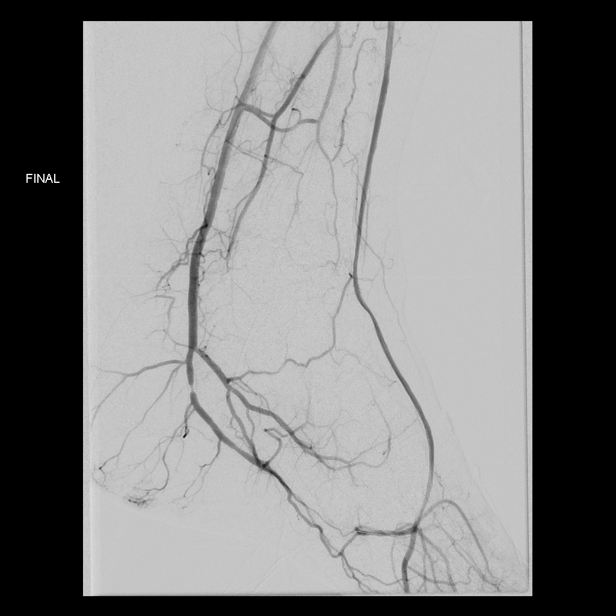

[Series 300: dsa extremities · 9 of 507 slices shown]
[im 25/507]
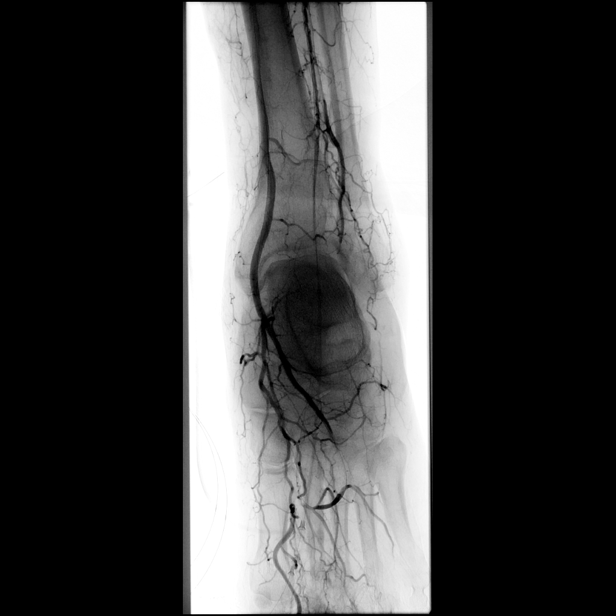
[im 73/507]
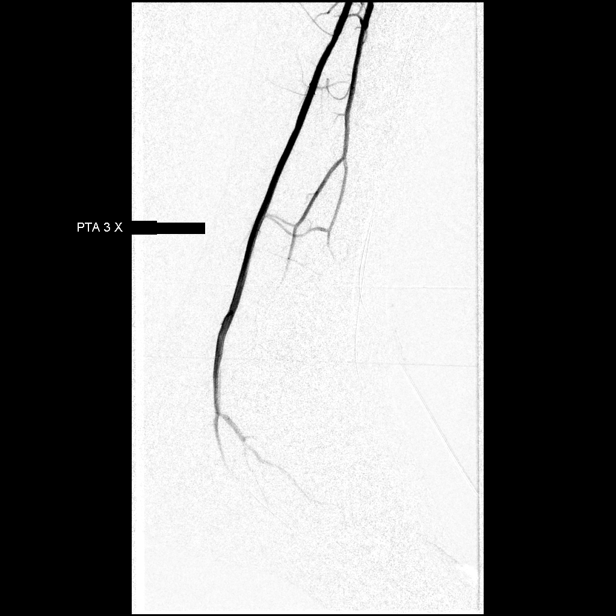
[im 121/507]
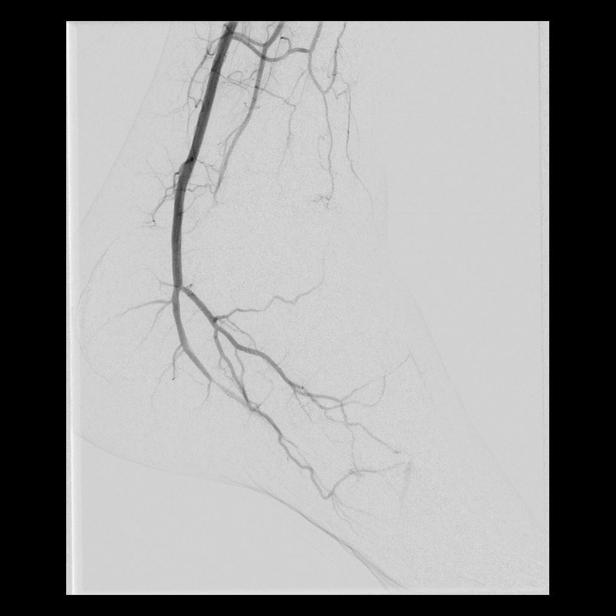
[im 217/507]
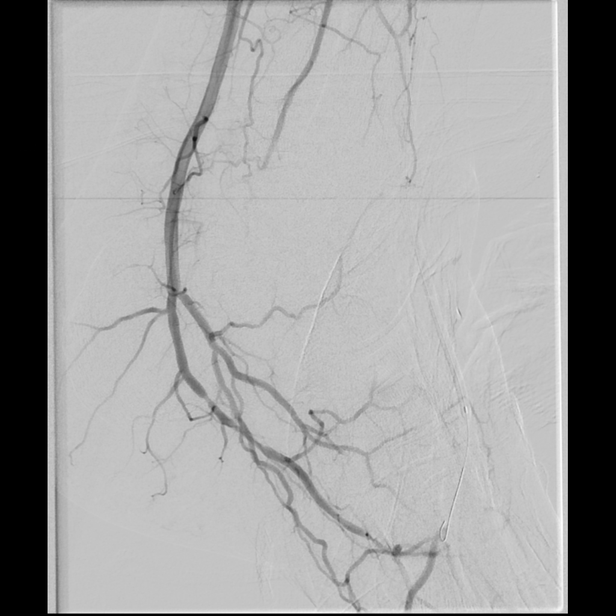
[im 266/507]
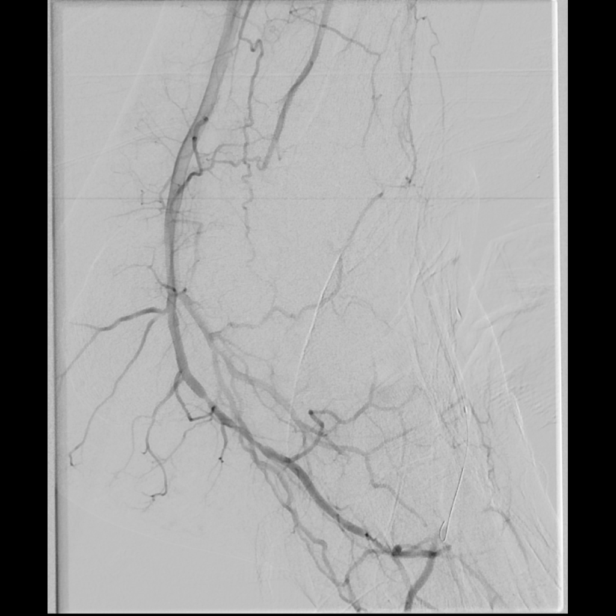
[im 314/507]
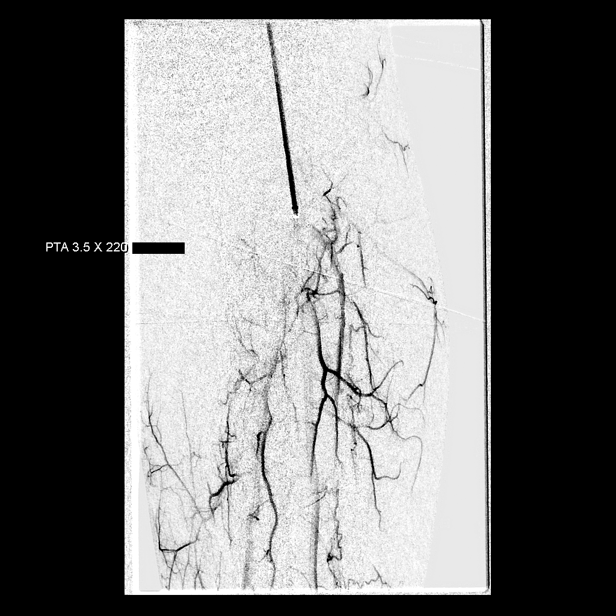
[im 362/507]
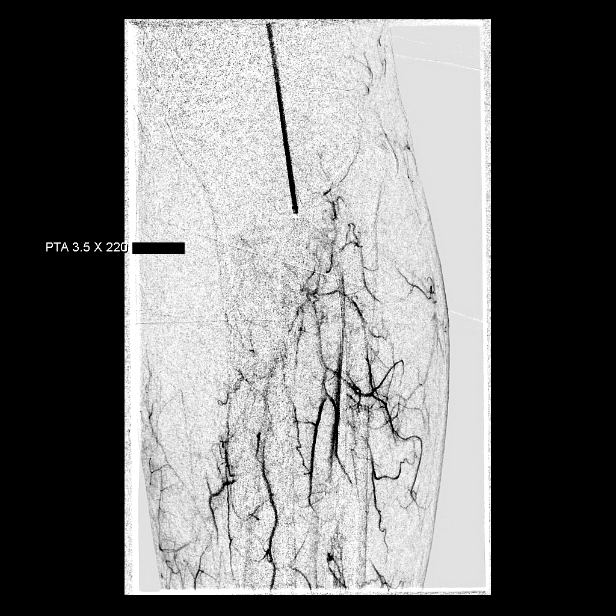
[im 434/507]
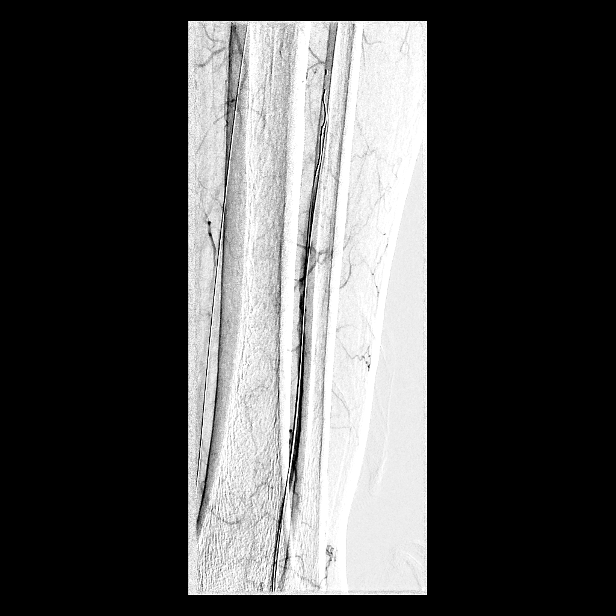
[im 482/507]
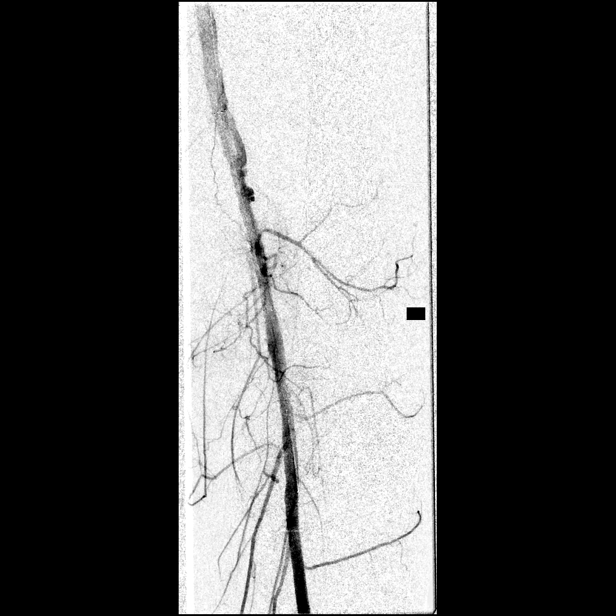

[11 of 24 positions shown; findings below may reference images not displayed]

He presents for revascularization of tibial vasculature for wound
healing capability.

EXAM:
ULTRASOUND GUIDED ACCESS LEFT COMMON FEMORAL ARTERY

IMAGE GUIDED ACCESS LEFT DORSALIS PEDIS

BALLOON ANGIOPLASTY OF ANTERIOR TIBIAL ARTERY

BALLOON ANGIOPLASTY DORSALIS PEDIS ARTERY

BALLOON ANGIOPLASTY OF LATERAL PLANTAR ARTERY AND DEEP PERFORATING
ARTERY

TREATMENT OF PROXIMAL ANTERIOR TIBIAL ARTERY WITH DRUG-ELUTING
BALLOON TECHNOLOGY

DEPLOYMENT OF ANGIO-SEAL FOR HEMOSTASIS

MEDICATIONS:
[6I] units heparin, 40 mg protamine, [6I] mcg nitroglycerin, 6 mg
intra arterial tPA

ANESTHESIA/SEDATION:
Moderate (conscious) sedation was employed during this procedure. A
total of Versed 6 mg and Fentanyl 250 mcg was administered
intravenously.

Moderate Sedation Time: 195 minutes. The patient's level of
consciousness and vital signs were monitored continuously by
radiology nursing throughout the procedure under my direct
supervision.

CONTRAST:  180 cc Isovue

FLUOROSCOPY TIME:  Fluoroscopy Time: 39 minutes 12 seconds (292 mGy)

COMPLICATIONS:
None



Ultrasound survey of the left inguinal region was performed with
images stored and sent to PACs.

1% lidocaine was used for local anesthesia. Small stab incision was
made with 11 blade scalpel. Blunt dissection was performed, to
greater tract for the anticipated closure device. A micropuncture
needle was used access the left common femoral artery under
ultrasound antegrade. With excellent arterial blood flow returned,
and an .018 micro wire was passed through the needle, observed enter
the SFA under fluoroscopy. The needle was removed, and a
micropuncture sheath was placed over the wire. Gentle contrast
injection performed. The inner dilator and wire were removed, and an
035 Bentson wire was advanced under fluoroscopy into the SFA. The
sheath was removed and a standard 5 French vascular sheath was
placed. The dilator was removed and the sheath was flushed.

Angiogram was performed of the left lower extremity.

A 6 French 60 cm standard sheath was then placed over the Bentson
wire.

A combination of 035, 018, 014 crossing catheters were used to probe
a small branch from the below knee popliteal artery/tibioperoneal
artery as the possible origin of anterior tibial artery at the knee.
Various wires were also used, including standard Glidewire for
access, gold tip 018 glidewire, 014 [HOSPITAL] command wire. 016 fathom
wire also used for accessing the collateral. After probing the small
collateral, it was evident that this was only a collateral and there
was likely a flush occlusion of the anterior tibial artery.

018 crossing catheter was then advanced into the posterior tibial
artery for addressing a retrograde approach to the pedal loop.

018 ASENEH crossing catheter and 014 command wire were used to address
the distal occlusion of the deep perforating artery at the plantar
loop. Using AP and lateral, the occlusion was navigated in a
retrograde fashion through the dorsalis pedis and distal anterior
tibial artery. Wire purchase was achieved with the 150 cm crossing
catheter into the distal anterior tibial artery.

Once the crossing catheter was hubbed, the command wire was removed
and an [REDACTED] core wire was placed. Balloon angioplasty was then
performed of the pedal loop, deep perforating artery, dorsalis pedis
lateral plantar artery, and the distal anterior tibial artery. 2 mm
balloon angioplasty was first performed, and then 2.5 mm angioplasty
in the distal anterior tibial artery.

Angiogram performed demonstrated poor retrograde filling of the
anterior tibial artery.

In order to retrograde recanalized the anterior tibial artery, the 2
mm angioplasty balloon was positioned at the instep, transition of
anterior tibial artery to dorsalis pedis, inflated, and then
punctured directly under fluoroscopic visualization.

With the needle within the angioplasty balloon, av 18 wire was
threaded into the balloon, which was passed retrograde.

Once the needle was removed and the wire had a few cm of purchase,
the angioplasty balloon was removed. The 18 wire remain place within
the dorsalis pedis.

Four French slender sheath was then placed into the distal anterior
tibial artery. The V18 wire was removed, and retrograde crossing of
the anterior tibial artery was performed with a standard Glidewire
and 035 crossing catheter. With the crossing catheter positioned in
the popliteal artery, the wire was removed, and gentle injection
confirmed luminal position. Command wire was then placed retrograde
into the superficial femoral artery. Balloon angioplasty was then
performed of the length of the anterior tibial artery using 2 mm, 3
mm, 3.5 mm along the length.

A series of balloon angioplasty was then performed using 2 mm,
mm, 3 mm angioplasty through the lateral plantar artery, pedal loop,
deep perforating artery, dorsalis pedis, and anterior tibial artery.
The balloon angioplasty was performed both through the anterior
tibial artery and from the antegrade common femoral access. Repeat
angiogram was demonstrating poor flow throughout this series of
balloon angioplasty.

Ultimately, antegrade access was performed through the 6 French
sheath at the common femoral artery into the anterior tibial artery
origin. 014 wire was placed distally, through the pedal loop into
the lateral plantar artery. Antegrade balloon angioplasty along the
length of the anterior tibial artery, dosalis pedis, and pedal loop
was performed. The origin of the anterior tibial artery was treated
with a 4 mm x 80 mm drug-eluting balloon.

Completion balloon angioplasty was performed again through the pedal
loop.

Final angiogram was performed.

Reversal of heparin was performed and then Angio-Seal was deployed
at the left common femoral artery access. A combination of manual
pressure and internal balloon angioplasty across the puncture site
at the ankle was performed for hemostasis at the foot.

Patient tolerated the procedure well and remained hemodynamically
stable throughout.

No complications were encountered and no significant blood loss.
IMPRESSION: Status post left lower extremity angiogram, revascularization of
occluded anterior tibial artery with angioplasty of anterior tibial
artery, dorsalis pedis, deep perforating artery, lateral plantar
artery, and reestablishing continuous flow through the pedal loop
with 3 vessel tibial flow to the ankle.

Status post deployment of Angio-Seal at the left common femoral
artery

Sig

## 2017-11-14 MED ORDER — HEPARIN SODIUM (PORCINE) 5000 UNIT/ML IJ SOLN
5000.0000 [IU] | Freq: Three times a day (TID) | INTRAMUSCULAR | Status: DC
  Administered 2017-11-15 – 2017-11-16 (×2): 5000 [IU] via SUBCUTANEOUS
  Filled 2017-11-14 (×2): qty 1

## 2017-11-14 MED ORDER — ALTEPLASE 2 MG IJ SOLR
INTRAMUSCULAR | Status: AC
  Filled 2017-11-14: qty 4

## 2017-11-14 MED ORDER — ALTEPLASE 2 MG IJ SOLR
INTRAMUSCULAR | Status: AC | PRN
  Administered 2017-11-14 (×2): 1 mg
  Administered 2017-11-14 (×2): 2 mg

## 2017-11-14 MED ORDER — IOPAMIDOL (ISOVUE-300) INJECTION 61%
INTRAVENOUS | Status: AC
  Filled 2017-11-14: qty 150

## 2017-11-14 MED ORDER — NITROGLYCERIN 1 MG/10 ML FOR IR/CATH LAB
INTRA_ARTERIAL | Status: AC | PRN
  Administered 2017-11-14 (×3): 200 ug via INTRA_ARTERIAL

## 2017-11-14 MED ORDER — MIDAZOLAM HCL 2 MG/2ML IJ SOLN
INTRAMUSCULAR | Status: AC
  Filled 2017-11-14: qty 2

## 2017-11-14 MED ORDER — FENTANYL CITRATE (PF) 100 MCG/2ML IJ SOLN
INTRAMUSCULAR | Status: AC
  Filled 2017-11-14: qty 2

## 2017-11-14 MED ORDER — LIDOCAINE HCL 1 % IJ SOLN
INTRAMUSCULAR | Status: AC
  Filled 2017-11-14: qty 20

## 2017-11-14 MED ORDER — FENTANYL CITRATE (PF) 100 MCG/2ML IJ SOLN
INTRAMUSCULAR | Status: AC
  Filled 2017-11-14: qty 4

## 2017-11-14 MED ORDER — LIDOCAINE HCL (PF) 1 % IJ SOLN
INTRAMUSCULAR | Status: AC | PRN
  Administered 2017-11-14: 20 mL

## 2017-11-14 MED ORDER — MIDAZOLAM HCL 2 MG/2ML IJ SOLN
INTRAMUSCULAR | Status: AC
  Filled 2017-11-14: qty 4

## 2017-11-14 MED ORDER — MIDAZOLAM HCL 2 MG/2ML IJ SOLN
INTRAMUSCULAR | Status: AC | PRN
  Administered 2017-11-14 (×2): 1 mg via INTRAVENOUS
  Administered 2017-11-14: 0.5 mg via INTRAVENOUS
  Administered 2017-11-14 (×3): 1 mg via INTRAVENOUS
  Administered 2017-11-14: 0.5 mg via INTRAVENOUS
  Administered 2017-11-14: 1 mg via INTRAVENOUS

## 2017-11-14 MED ORDER — HEPARIN SODIUM (PORCINE) 1000 UNIT/ML IJ SOLN
INTRAMUSCULAR | Status: AC | PRN
  Administered 2017-11-14 (×2): 2000 [IU] via INTRAVENOUS
  Administered 2017-11-14: 8000 [IU] via INTRAVENOUS

## 2017-11-14 MED ORDER — HEPARIN SODIUM (PORCINE) 1000 UNIT/ML IJ SOLN
INTRAMUSCULAR | Status: AC
  Filled 2017-11-14: qty 1

## 2017-11-14 MED ORDER — SODIUM CHLORIDE 0.9 % IV SOLN: INTRAVENOUS | Status: AC

## 2017-11-14 MED ORDER — IOPAMIDOL (ISOVUE-300) INJECTION 61%
INTRAVENOUS | Status: AC
  Administered 2017-11-14: 180 mL
  Filled 2017-11-14: qty 50

## 2017-11-14 MED ORDER — FENTANYL CITRATE (PF) 100 MCG/2ML IJ SOLN
INTRAMUSCULAR | Status: AC | PRN
  Administered 2017-11-14: 25 ug via INTRAVENOUS
  Administered 2017-11-14 (×2): 50 ug via INTRAVENOUS
  Administered 2017-11-14: 25 ug via INTRAVENOUS
  Administered 2017-11-14 (×4): 50 ug via INTRAVENOUS

## 2017-11-14 MED ORDER — NITROGLYCERIN 1 MG/10 ML FOR IR/CATH LAB
INTRA_ARTERIAL | Status: AC
  Filled 2017-11-14: qty 10

## 2017-11-14 MED ORDER — PROTAMINE SULFATE 10 MG/ML IV SOLN
40.0000 mg | Freq: Once | INTRAVENOUS | Status: AC
  Administered 2017-11-14: 40 mg via INTRAVENOUS
  Filled 2017-11-14 (×2): qty 4

## 2017-11-14 NOTE — Sedation Documentation (Signed)
Patient is resting comfortably. 

## 2017-11-14 NOTE — Sedation Documentation (Signed)
Patient denies pain and is resting comfortably.  

## 2017-11-14 NOTE — Sedation Documentation (Addendum)
Vital signs stable. Pt complains of pain in left calf

## 2017-11-14 NOTE — Sedation Documentation (Signed)
Vital signs stable. 

## 2017-11-14 NOTE — Sedation Documentation (Signed)
Pt complains of pain in left leg

## 2017-11-14 NOTE — Progress Notes (Signed)
PROGRESS NOTE  Cody Pena ZOX:[id number] DOB: 09/19/1960 DOA: 11/10/2017 PCP: Aida Puffer, MD  HPI/Recap of past 71 hours: 57 year old man PMH diabetes mellitus with peripheral neuropathy, followed by wound clinic for left foot wound, sent to the emergency department after outpatient MRI revealed septic arthritis and osteomyelitis of the left foot.  11/13/2017: Patient seen and examined at his bedside.  He states the pain on his left dorsal foot is well controlled on current pain medications.  He has no new complaints.  07/16/2018: Patient seen and examined at his bedside.  Has no new complaints at this time.  Orthopedic surgery planning left foot second ray amputation tomorrow 11/15/17.  Assessment/Plan: Principal Problem:   Septic arthritis of foot (HCC) Active Problems:   Diabetic foot infection (HCC)   Osteomyelitis of ankle or foot, acute, left (HCC)   Type 2 DM with diabetic peripheral angiopathy w/o gangrene (HCC)   Cigarette smoker   Diabetic peripheral neuropathy (HCC)  Left dorsal foot wound/osteomyelitis Antibiotics on hold pending surgical evaluation Vascular surgery consultation placed to see if surgery could be  avoided Continue current pain management Possible left second metatarsal amputation on 11/15/2017 Orthopedic surgery following IR angiogram done today 11/14/17.  Awaiting results. Repeat CBC in the morning  Diabetes uncontrolled due to hyperglycemia Last A1c was 7.2 on 11/10/2017 Continue insulin sliding scale Avoid hypoglycemia or hyperglycemia Continue insulin sliding scale  Peripheral vascular disease status post IR atherectomy with PTA and stent Continue statin and aspirin  Hypertension Blood pressure stable  Tobacco use disorder Tobacco cessation counseling at bedside     Code Status: Full code  Family Communication: None at bedside  Disposition Plan: SNF when clinically stable   Consultants:  Orthopedic  surgery Procedures:  None  Antimicrobials:  None  DVT prophylaxis: SCDs   Objective: Vitals:   11/14/17 1740 11/14/17 1745 11/14/17 1750 11/14/17 1755  BP: 104/64  100/72 106/70  Pulse: 72 73 73 73  Resp: 16 13 11 16   Temp:      TempSrc:      SpO2: 99% 98% 98% 98%  Weight:      Height:        Intake/Output Summary (Last 24 hours) at 11/14/2017 1757 Last data filed at 11/14/2017 1500 Gross per 24 hour  Intake 240 ml  Output -  Net 240 ml   Filed Weights   11/10/17 1753 11/11/17 1212  Weight: 111.1 kg (245 lb) 106.1 kg (233 lb 14.5 oz)    Exam: 11/14/17   General: 57 year old Caucasian male well-developed well-nourished no acute distress.  Alert and oriented x3.  Cardiovascular: Regular rate and rhythm with no rubs or gallops.  No JVD or thyromegaly present  Respiratory: Clear to auscultation with no wheezes or rales.  Abdomen: Soft nontender  Normal bowel sounds x4 quadrant.  Musculoskeletal: Left dorsal foot with deep wound  Skin: As stated above  Psychiatry: Mood is appropriate for condition and setting   Data Reviewed: CBC: Recent Labs  Lab 11/08/17 1852 11/10/17 1815 11/11/17 0457 11/14/17 0936  WBC 12.6* 13.7* 11.2* 9.6  NEUTROABS 8.2* 8.9*  --   --   HGB 15.0 15.1 13.5 12.8*  HCT 44.9 44.8 40.5 38.8*  MCV 95.3 93.9 94.2 93.7  PLT 242 310 273 272   Basic Metabolic Panel: Recent Labs  Lab 11/08/17 1852 11/10/17 1815 11/11/17 0457 11/14/17 0936  NA 135 137 137 135  K 4.0 4.3 3.9 4.3  CL 97* 99* 101 97*  CO2 27  28 26 29   GLUCOSE 126* 118* 108* 121*  BUN 11 9 8 11   CREATININE 0.91 0.88 0.85 0.92  CALCIUM 9.2 9.1 8.6* 8.6*   GFR: Estimated Creatinine Clearance: 115.3 mL/min (by C-G formula based on SCr of 0.92 mg/dL). Liver Function Tests: Recent Labs  Lab 11/08/17 1852 11/10/17 1815 11/11/17 0457 11/14/17 0936  AST 16 16 15 15   ALT 22 21 18 17   ALKPHOS 78 78 74 71  BILITOT 0.4 0.4 0.4 0.7  PROT 7.4 7.5 6.1* 6.3*  ALBUMIN  3.3* 3.4* 2.9* 2.9*   No results for input(s): LIPASE, AMYLASE in the last 168 hours. No results for input(s): AMMONIA in the last 168 hours. Coagulation Profile: Recent Labs  Lab 11/08/17 1852 11/10/17 1815  INR 1.08 1.05   Cardiac Enzymes: No results for input(s): CKTOTAL, CKMB, CKMBINDEX, TROPONINI in the last 168 hours. BNP (last 3 results) No results for input(s): PROBNP in the last 8760 hours. HbA1C: No results for input(s): HGBA1C in the last 72 hours. CBG: Recent Labs  Lab 11/12/17 2117 11/13/17 0652 11/13/17 1124 11/13/17 1634 11/13/17 2150  GLUCAP 146* 109* 143* 108* 140*   Lipid Profile: No results for input(s): CHOL, HDL, LDLCALC, TRIG, CHOLHDL, LDLDIRECT in the last 72 hours. Thyroid Function Tests: No results for input(s): TSH, T4TOTAL, FREET4, T3FREE, THYROIDAB in the last 72 hours. Anemia Panel: No results for input(s): VITAMINB12, FOLATE, FERRITIN, TIBC, IRON, RETICCTPCT in the last 72 hours. Urine analysis:    Component Value Date/Time   COLORURINE YELLOW 12/07/2014 0325   APPEARANCEUR CLEAR 12/07/2014 0325   LABSPEC 1.023 12/07/2014 0325   PHURINE 5.5 12/07/2014 0325   GLUCOSEU 500 (A) 12/07/2014 0325   HGBUR NEGATIVE 12/07/2014 0325   BILIRUBINUR NEGATIVE 12/07/2014 0325   KETONESUR NEGATIVE 12/07/2014 0325   PROTEINUR NEGATIVE 12/07/2014 0325   UROBILINOGEN 1.0 12/07/2014 0325   NITRITE NEGATIVE 12/07/2014 0325   LEUKOCYTESUR NEGATIVE 12/07/2014 0325   Sepsis Labs: @LABRCNTIP (procalcitonin:4,lacticidven:4)  ) Recent Results (from the past 240 hour(s))  Culture, blood (Routine x 2)     Status: None   Collection Time: 11/08/17  6:50 PM  Result Value Ref Range Status   Specimen Description BLOOD RIGHT ANTECUBITAL  Final   Special Requests   Final    BOTTLES DRAWN AEROBIC AND ANAEROBIC Blood Culture adequate volume   Culture   Final    NO GROWTH 5 DAYS Performed at [facility], [address], [place], [place] [address]    Report  Status 11/13/2017 FINAL  Final  Culture, blood (Routine x 2)     Status: None (Preliminary result)   Collection Time: 11/10/17  6:15 PM  Result Value Ref Range Status   Specimen Description BLOOD RIGHT ANTECUBITAL  Final   Special Requests   Final    BOTTLES DRAWN AEROBIC AND ANAEROBIC Blood Culture adequate volume   Culture   Final    NO GROWTH 4 DAYS Performed at [facility], [address], [place], [place] [address]    Report Status PENDING  Incomplete  Culture, blood (Routine x 2)     Status: None (Preliminary result)   Collection Time: 11/10/17  7:50 PM  Result Value Ref Range Status   Specimen Description BLOOD LEFT ANTECUBITAL  Final   Special Requests IN PEDIATRIC BOTTLE Blood Culture adequate volume  Final   Culture   Final    NO GROWTH 4 DAYS Performed at [facility], [address], [place], [place]  [id number]    Report Status PENDING  Incomplete  Wound or Superficial Culture     Status: None   Collection Time: 11/10/17  8:41 PM  Result Value Ref Range Status   Specimen Description WOUND LEFT FOOT  Final   Special Requests NONE  Final   Gram Stain   Final    MODERATE WBC PRESENT, PREDOMINANTLY PMN RARE GRAM POSITIVE COCCI    Culture   Final    NORMAL SKIN FLORA Performed at [facility], [address] [place], [place] [id number]    Report Status 11/14/2017 FINAL  Final      Studies: No results found.  Scheduled Meds: . alteplase      . aspirin EC  81 mg Oral Daily  . atorvastatin  80 mg Oral Daily  . citalopram  40 mg Oral Daily  . feeding supplement (ENSURE ENLIVE)  237 mL Oral BID BM  . fentaNYL      . gabapentin  300 mg Oral TID  . heparin      . heparin  5,000 Units Subcutaneous Q8H  . insulin aspart  0-15 Units Subcutaneous TID WC & HS  . insulin glargine  50 Units Subcutaneous QHS  . iopamidol      . iopamidol      . lidocaine      . midazolam      . multivitamin with minerals  1 tablet Oral Daily  . nicotine  21 mg  Transdermal Daily  . polyethylene glycol  17 g Oral Daily  . sodium chloride flush  3 mL Intravenous Q12H    Continuous Infusions:   LOS: 4 days     Darlin Droparole N Rex Magee, MD [facility] Pager [phone]  If 7PM-7AM, please contact night-coverage [email or web address] Password [email or web address] 11/14/2017, 5:57 PM

## 2017-11-14 NOTE — Anesthesia Preprocedure Evaluation (Addendum)
Anesthesia Evaluation  Patient identified by MRN, date of birth, ID band Patient awake    Reviewed: Allergy & Precautions, NPO status , Patient's Chart, lab work & pertinent test results  History of Anesthesia Complications Negative for: history of anesthetic complications  Airway Mallampati: II  TM Distance: >3 FB Neck ROM: Full    Dental  (+) Dental Advisory Given, Poor Dentition, Missing, Loose   Pulmonary Current Smoker,    breath sounds clear to auscultation       Cardiovascular hypertension, Pt. 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), Insulin Dependent  Renal/GU negative Renal ROS     Musculoskeletal   Abdominal   Peds  Hematology negative hematology ROS (+)   Anesthesia Other Findings   Reproductive/Obstetrics                            Anesthesia Physical Anesthesia Plan  ASA: III  Anesthesia Plan: General   Post-op Pain Management:    Induction: Intravenous  PONV Risk Score and Plan: 2 and Ondansetron  Airway Management Planned: LMA  Additional Equipment:   Intra-op Plan:   Post-operative Plan: Extubation in OR  Informed Consent:   Dental advisory given  Plan Discussed with: CRNA and Surgeon  Anesthesia Plan Comments: (Plan routine monitors, GA- LMA OK)        Anesthesia Quick Evaluation

## 2017-11-14 NOTE — Sedation Documentation (Signed)
Pt comfortable at this time.  

## 2017-11-14 NOTE — H&P (View-Only) (Signed)
Patient ID: Cody Pena, male   DOB: 03/19/1961, 56 y.o.   MRN: [id number] Patient to proceed with radiology today for vascular intervention for the left foot.  Plan for left foot second ray amputation tomorrow Friday.  Patient may discharge to home this weekend once he is safe with therapy nonweightbearing on the left postoperatively. 

## 2017-11-14 NOTE — Sedation Documentation (Signed)
Vital signs stable. Pt has no complaints at this time. 

## 2017-11-14 NOTE — Progress Notes (Signed)
Patient ID: Cody CarrelRobert K Delima, male   DOB: [birth year], 57 y.o.   MRN: [id number] Patient to proceed with radiology today for vascular intervention for the left foot.  Plan for left foot second ray amputation tomorrow Friday.  Patient may discharge to home this weekend once he is safe with therapy nonweightbearing on the left postoperatively.

## 2017-11-14 NOTE — Procedures (Signed)
Interventional Radiology Procedure Note  Procedure:   - US guided access of the left CFA for left angiogram.  - Revascularization of occluded Anterior tibial artery with completion of the pedal loop for continuous flow via the AT & PT/plantar system - Deployment of 69F angioseal at the left CFA  Medications: - 12K U heparin intraop - Reversal of heparin with 40mg  IV protamine at conclusion - 6 mg IA tPA - 1000mcg nitro IA  Complications: None  Recommendations:  - 150cc /hr NS x 6 hours for hydration - left hip straight x 3 hours - routine wound care of the left hip and the left ankle - frequent NV check of left lower extremity - Do not submerge for 7 days - [facility]  Signed,  Yvone NeuJaime S. Loreta AveWagner, DO

## 2017-11-15 ENCOUNTER — Encounter (HOSPITAL_COMMUNITY)
Admission: EM | Disposition: A | Discharge status: Home / Self Care | Payer: Self-pay | Source: Home / Self Care | Attending: Internal Medicine

## 2017-11-15 ENCOUNTER — Inpatient Hospital Stay (HOSPITAL_COMMUNITY): Payer: 59 | Admitting: Anesthesiology

## 2017-11-15 ENCOUNTER — Encounter (HOSPITAL_COMMUNITY): Payer: Self-pay | Admitting: Interventional Radiology

## 2017-11-15 DIAGNOSIS — M86272 Subacute osteomyelitis, left ankle and foot: Secondary | ICD-10-CM

## 2017-11-15 DIAGNOSIS — M869 Osteomyelitis, unspecified: Secondary | ICD-10-CM

## 2017-11-15 DIAGNOSIS — Z794 Long term (current) use of insulin: Secondary | ICD-10-CM

## 2017-11-15 HISTORY — PX: AMPUTATION: SHX166

## 2017-11-15 LAB — CULTURE, BLOOD (ROUTINE X 2)
CULTURE: NO GROWTH
CULTURE: NO GROWTH
Special Requests: ADEQUATE
Special Requests: ADEQUATE

## 2017-11-15 LAB — GLUCOSE, CAPILLARY
Glucose-Capillary: 166 mg/dL — ABNORMAL HIGH (ref 65–99)
Glucose-Capillary: 152 mg/dL — ABNORMAL HIGH (ref 65–99)
Glucose-Capillary: 179 mg/dL — ABNORMAL HIGH (ref 65–99)
Glucose-Capillary: 257 mg/dL — ABNORMAL HIGH (ref 65–99)
Glucose-Capillary: 287 mg/dL — ABNORMAL HIGH (ref 65–99)
Glucose-Capillary: 284 mg/dL — ABNORMAL HIGH (ref 65–99)

## 2017-11-15 SURGERY — AMPUTATION, FOOT, RAY
Anesthesia: General | Laterality: Left

## 2017-11-15 MED ORDER — PROPOFOL 10 MG/ML IV BOLUS
INTRAVENOUS | Status: DC | PRN
  Administered 2017-11-15: 140 mg via INTRAVENOUS

## 2017-11-15 MED ORDER — PROMETHAZINE HCL 25 MG/ML IJ SOLN: 6.2500 mg | INTRAMUSCULAR | Status: DC | PRN

## 2017-11-15 MED ORDER — CEFAZOLIN SODIUM-DEXTROSE 2-3 GM-%(50ML) IV SOLR
INTRAVENOUS | Status: DC | PRN
  Administered 2017-11-15: 2 g via INTRAVENOUS

## 2017-11-15 MED ORDER — METHOCARBAMOL 1000 MG/10ML IJ SOLN
500.0000 mg | Freq: Four times a day (QID) | INTRAVENOUS | Status: DC | PRN
  Filled 2017-11-15: qty 5

## 2017-11-15 MED ORDER — ONDANSETRON HCL 4 MG/2ML IJ SOLN
INTRAMUSCULAR | Status: DC | PRN
  Administered 2017-11-15: 4 mg via INTRAVENOUS

## 2017-11-15 MED ORDER — MIDAZOLAM HCL 5 MG/5ML IJ SOLN
INTRAMUSCULAR | Status: DC | PRN
  Administered 2017-11-15: 2 mg via INTRAVENOUS

## 2017-11-15 MED ORDER — HYDROMORPHONE HCL 1 MG/ML IJ SOLN
INTRAMUSCULAR | Status: AC
  Administered 2017-11-15: 09:00:00
  Filled 2017-11-15: qty 1

## 2017-11-15 MED ORDER — PROPOFOL 10 MG/ML IV BOLUS
INTRAVENOUS | Status: AC
  Filled 2017-11-15: qty 20

## 2017-11-15 MED ORDER — BISACODYL 10 MG RE SUPP: 10.0000 mg | Freq: Every day | RECTAL | Status: DC | PRN

## 2017-11-15 MED ORDER — METOCLOPRAMIDE HCL 5 MG PO TABS: 5.0000 mg | ORAL_TABLET | Freq: Three times a day (TID) | ORAL | Status: DC | PRN

## 2017-11-15 MED ORDER — DEXAMETHASONE SODIUM PHOSPHATE 10 MG/ML IJ SOLN
INTRAMUSCULAR | Status: DC | PRN
  Administered 2017-11-15: 5 mg via INTRAVENOUS

## 2017-11-15 MED ORDER — MIDAZOLAM HCL 2 MG/2ML IJ SOLN: 0.5000 mg | Freq: Once | INTRAMUSCULAR | Status: DC | PRN

## 2017-11-15 MED ORDER — MIDAZOLAM HCL 2 MG/2ML IJ SOLN
INTRAMUSCULAR | Status: AC
  Filled 2017-11-15: qty 2

## 2017-11-15 MED ORDER — MAGNESIUM CITRATE PO SOLN: 1.0000 | Freq: Once | ORAL | Status: DC | PRN

## 2017-11-15 MED ORDER — HYDROMORPHONE HCL 1 MG/ML IJ SOLN
0.2500 mg | INTRAMUSCULAR | Status: DC | PRN
  Administered 2017-11-15 (×2): 0.5 mg via INTRAVENOUS

## 2017-11-15 MED ORDER — MEPERIDINE HCL 50 MG/ML IJ SOLN: 6.2500 mg | INTRAMUSCULAR | Status: DC | PRN

## 2017-11-15 MED ORDER — LACTATED RINGERS IV SOLN
INTRAVENOUS | Status: DC | PRN
  Administered 2017-11-15: 07:00:00 via INTRAVENOUS

## 2017-11-15 MED ORDER — LIDOCAINE HCL (CARDIAC) 20 MG/ML IV SOLN
INTRAVENOUS | Status: DC | PRN
  Administered 2017-11-15: 60 mg via INTRAVENOUS

## 2017-11-15 MED ORDER — ONDANSETRON HCL 4 MG/2ML IJ SOLN
4.0000 mg | Freq: Four times a day (QID) | INTRAMUSCULAR | Status: DC | PRN
Start: 2017-11-15 — End: 2017-11-16

## 2017-11-15 MED ORDER — 0.9 % SODIUM CHLORIDE (POUR BTL) OPTIME
TOPICAL | Status: DC | PRN
  Administered 2017-11-15: 1000 mL

## 2017-11-15 MED ORDER — ONDANSETRON HCL 4 MG/2ML IJ SOLN
INTRAMUSCULAR | Status: AC
  Filled 2017-11-15: qty 2

## 2017-11-15 MED ORDER — POLYETHYLENE GLYCOL 3350 17 G PO PACK: 17.0000 g | PACK | Freq: Every day | ORAL | Status: DC | PRN

## 2017-11-15 MED ORDER — CEFAZOLIN SODIUM-DEXTROSE 1-4 GM/50ML-% IV SOLN
1.0000 g | Freq: Four times a day (QID) | INTRAVENOUS | Status: AC
  Administered 2017-11-15 – 2017-11-16 (×3): 1 g via INTRAVENOUS
  Filled 2017-11-15 (×3): qty 50

## 2017-11-15 MED ORDER — DEXAMETHASONE SODIUM PHOSPHATE 10 MG/ML IJ SOLN
INTRAMUSCULAR | Status: AC
  Filled 2017-11-15: qty 1

## 2017-11-15 MED ORDER — FENTANYL CITRATE (PF) 250 MCG/5ML IJ SOLN
INTRAMUSCULAR | Status: DC | PRN
  Administered 2017-11-15: 75 ug via INTRAVENOUS
  Administered 2017-11-15: 25 ug via INTRAVENOUS

## 2017-11-15 MED ORDER — DOCUSATE SODIUM 100 MG PO CAPS
100.0000 mg | ORAL_CAPSULE | Freq: Two times a day (BID) | ORAL | Status: DC
  Administered 2017-11-15 – 2017-11-16 (×3): 100 mg via ORAL
  Filled 2017-11-15 (×3): qty 1

## 2017-11-15 MED ORDER — SODIUM CHLORIDE 0.9 % IV SOLN
INTRAVENOUS | Status: DC
  Administered 2017-11-15: 10:00:00 via INTRAVENOUS

## 2017-11-15 MED ORDER — PROPOFOL 500 MG/50ML IV EMUL: INTRAVENOUS | Status: DC | PRN

## 2017-11-15 MED ORDER — ONDANSETRON HCL 4 MG PO TABS: 4.0000 mg | ORAL_TABLET | Freq: Four times a day (QID) | ORAL | Status: DC | PRN

## 2017-11-15 MED ORDER — METHOCARBAMOL 500 MG PO TABS
500.0000 mg | ORAL_TABLET | Freq: Four times a day (QID) | ORAL | Status: DC | PRN
  Administered 2017-11-15 – 2017-11-16 (×3): 500 mg via ORAL
  Filled 2017-11-15 (×3): qty 1

## 2017-11-15 MED ORDER — LIDOCAINE HCL (CARDIAC) 20 MG/ML IV SOLN
INTRAVENOUS | Status: AC
  Filled 2017-11-15: qty 5

## 2017-11-15 MED ORDER — FENTANYL CITRATE (PF) 250 MCG/5ML IJ SOLN
INTRAMUSCULAR | Status: AC
  Filled 2017-11-15: qty 5

## 2017-11-15 MED ORDER — METOCLOPRAMIDE HCL 5 MG/ML IJ SOLN: 5.0000 mg | Freq: Three times a day (TID) | INTRAMUSCULAR | Status: DC | PRN

## 2017-11-15 SURGICAL SUPPLY — 31 items
BLADE SAW SGTL MED 73X18.5 STR (BLADE) IMPLANT
BLADE SURG 21 STRL SS (BLADE) ×3 IMPLANT
BNDG COHESIVE 4X5 TAN STRL (GAUZE/BANDAGES/DRESSINGS) ×3 IMPLANT
BNDG GAUZE ELAST 4 BULKY (GAUZE/BANDAGES/DRESSINGS) ×3 IMPLANT
COVER SURGICAL LIGHT HANDLE (MISCELLANEOUS) ×6 IMPLANT
DRAPE U-SHAPE 47X51 STRL (DRAPES) ×6 IMPLANT
DRSG ADAPTIC 3X8 NADH LF (GAUZE/BANDAGES/DRESSINGS) ×3 IMPLANT
DRSG PAD ABDOMINAL 8X10 ST (GAUZE/BANDAGES/DRESSINGS) ×6 IMPLANT
DURAPREP 26ML APPLICATOR (WOUND CARE) ×3 IMPLANT
ELECT REM PT RETURN 9FT ADLT (ELECTROSURGICAL) ×3
ELECTRODE REM PT RTRN 9FT ADLT (ELECTROSURGICAL) ×1 IMPLANT
GAUZE SPONGE 4X4 12PLY STRL (GAUZE/BANDAGES/DRESSINGS) ×3 IMPLANT
GLOVE BIOGEL PI IND STRL 9 (GLOVE) ×1 IMPLANT
GLOVE BIOGEL PI INDICATOR 9 (GLOVE) ×2
GLOVE SURG ORTHO 9.0 STRL STRW (GLOVE) ×3 IMPLANT
GOWN STRL REUS W/ TWL XL LVL3 (GOWN DISPOSABLE) ×2 IMPLANT
GOWN STRL REUS W/TWL XL LVL3 (GOWN DISPOSABLE) ×4
KIT BASIN OR (CUSTOM PROCEDURE TRAY) ×3 IMPLANT
KIT TURNOVER KIT B (KITS) ×3 IMPLANT
NS IRRIG 1000ML POUR BTL (IV SOLUTION) ×3 IMPLANT
PACK ORTHO EXTREMITY (CUSTOM PROCEDURE TRAY) ×3 IMPLANT
PAD ABD 8X10 STRL (GAUZE/BANDAGES/DRESSINGS) ×2 IMPLANT
PAD ARMBOARD 7.5X6 YLW CONV (MISCELLANEOUS) ×6 IMPLANT
PADDING CAST ABS 4INX4YD NS (CAST SUPPLIES) ×2
PADDING CAST ABS COTTON 4X4 ST (CAST SUPPLIES) IMPLANT
STOCKINETTE IMPERVIOUS LG (DRAPES) IMPLANT
SUT ETHILON 2 0 PSLX (SUTURE) ×3 IMPLANT
TOWEL OR 17X26 10 PK STRL BLUE (TOWEL DISPOSABLE) ×3 IMPLANT
TUBE CONNECTING 12'X1/4 (SUCTIONS) ×1
TUBE CONNECTING 12X1/4 (SUCTIONS) ×2 IMPLANT
YANKAUER SUCT BULB TIP NO VENT (SUCTIONS) ×3 IMPLANT

## 2017-11-15 NOTE — Anesthesia Postprocedure Evaluation (Signed)
Anesthesia Post Note  Patient: Jaci CarrelRobert K Graley  Procedure(s) Performed: LEFT FOOT 2ND RAY AMPUTATION (Left )     Patient location during evaluation: PACU Anesthesia Type: General Level of consciousness: awake and alert, oriented and patient cooperative Pain management: pain level controlled Vital Signs Assessment: post-procedure vital signs reviewed and stable Respiratory status: spontaneous breathing, nonlabored ventilation, respiratory function stable and patient connected to nasal cannula oxygen Cardiovascular status: blood pressure returned to baseline and stable Postop Assessment: no apparent nausea or vomiting Anesthetic complications: no    Last Vitals:  Vitals:   11/15/17 0926 11/15/17 0945  BP:  114/69  Pulse: 87 80  Resp:  15  Temp: (!) 36.1 C 37.1 C  SpO2: 98% 98%    Last Pain:  Vitals:   11/15/17 0945  TempSrc: Oral  PainSc:                  Heavenly Christine,E. Hans Rusher

## 2017-11-15 NOTE — Evaluation (Signed)
Physical Therapy Evaluation Patient Details Name: Cody Pena MRN: [id number] DOB: 29-Jan-1961 Today's Date: 11/15/2017   History of Present Illness  57yo male presenting with a L foot wound, referred to the ED by wound center due to concerns for osteomyelitis. 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 PMH DM, HTN, peripheral neuropathy, anxiety/depression, active smoker   Clinical Impression   Patient received in bed, pleasant and eager to participate in PT today. Provided education regarding technique on how to maintain NWB status L LE with functional transfers and gait, and patient was able to perform all mobility today with S to Min guard while consistently maintaining NWB L LE. He did express fatigue during gait period and required multiple standing rest breaks today. Recommend trial of crutches and stair training next session. He will continue to benefit from skilled PT services in the acute setting, and will also benefit from skilled HHPT services to further address functional deficits moving forward.     Follow Up Recommendations Home health PT    Equipment Recommendations  Crutches;Rolling walker with 5" wheels(2 wheeled walker, not 4 wheeled )    Recommendations for Other Services       Precautions / Restrictions Precautions Precautions: None Restrictions Weight Bearing Restrictions: Yes LLE Weight Bearing: Non weight bearing Other Position/Activity Restrictions: needs post-op shoe L foot (ortho tech had not yet delivered at time of eval)      Mobility  Bed Mobility Overal bed mobility: Modified Independent                Transfers Overall transfer level: Needs assistance Equipment used: Rolling walker (2 wheeled) Transfers: Sit to/from Stand Sit to Stand: Supervision         General transfer comment: cues to maintain NWB status L LE   Ambulation/Gait Ambulation/Gait assistance: Min guard Ambulation Distance (Feet): 80 Feet Assistive device:  Rolling walker (2 wheeled) Gait Pattern/deviations: Step-to pattern;Drifts right/left;Trunk flexed     General Gait Details: swing to pattern while maintaining NWB L LE, did express UE fatigue and required standing rest breaks during gait; able to maintain NWB status consistently without dificulty   Stairs            Wheelchair Mobility    Modified Rankin (Stroke Patients Only)       Balance Overall balance assessment: Mild deficits observed, not formally tested                                           Pertinent Vitals/Pain Pain Assessment: 0-10 Pain Score: 8  Pain Location: L foot surgical site  Pain Descriptors / Indicators: Aching;Sore Pain Intervention(s): Limited activity within patient's tolerance;Monitored during session;Premedicated before session;Repositioned    Home Living Family/patient expects to be discharged to:: Private residence Living Arrangements: Spouse/significant other;Children Available Help at Discharge: Family;Available PRN/intermittently Type of Home: House Home Access: Stairs to enter Entrance Stairs-Rails: Right Entrance Stairs-Number of Steps: 4 Home Layout: One level Home Equipment: None      Prior Function Level of Independence: Independent               Hand Dominance        Extremity/Trunk Assessment        Lower Extremity Assessment Lower Extremity Assessment: Overall WFL for tasks assessed    Cervical / Trunk Assessment Cervical / Trunk Assessment: Normal  Communication   Communication: No  difficulties  Cognition Arousal/Alertness: Awake/alert Behavior During Therapy: WFL for tasks assessed/performed Overall Cognitive Status: Within Functional Limits for tasks assessed                                        General Comments      Exercises     Assessment/Plan    PT Assessment Patient needs continued PT services  PT Problem List Decreased strength;Decreased  mobility;Pain;Decreased knowledge of use of DME       PT Treatment Interventions DME instruction;Therapeutic activities;Gait training;Therapeutic exercise;Patient/family education;Stair training;Balance training;Functional mobility training;Neuromuscular re-education    PT Goals (Current goals can be found in the Care Plan section)  Acute Rehab PT Goals Patient Stated Goal: to go home  PT Goal Formulation: With patient Time For Goal Achievement: 11/29/17 Potential to Achieve Goals: Good    Frequency Min 4X/week   Barriers to discharge        Co-evaluation               AM-PAC PT "6 Clicks" Daily Activity  Outcome Measure Difficulty turning over in bed (including adjusting bedclothes, sheets and blankets)?: None Difficulty moving from lying on back to sitting on the side of the bed? : None Difficulty sitting down on and standing up from a chair with arms (e.g., wheelchair, bedside commode, etc,.)?: None Help needed moving to and from a bed to chair (including a wheelchair)?: A Little Help needed walking in hospital room?: A Little Help needed climbing 3-5 steps with a railing? : A Lot 6 Click Score: 20    End of Session Equipment Utilized During Treatment: Gait belt Activity Tolerance: Patient tolerated treatment well Patient left: in bed;with call bell/phone within reach;with SCD's reapplied Nurse Communication: Mobility status PT Visit Diagnosis: Other abnormalities of gait and mobility (R26.89);Difficulty in walking, not elsewhere classified (R26.2);Muscle weakness (generalized) (M62.81)    Time: [id number] PT Time Calculation (min) (ACUTE ONLY): 17 min   Charges:   PT Evaluation $PT Eval Low Complexity: 1 Low     PT G Codes:        Nedra HaiKristen Melainie Krinsky PT, DPT, CBIS  Supplemental Physical Therapist [facility]   Pager [phone]

## 2017-11-15 NOTE — Progress Notes (Signed)
Orthopedic Tech Progress Note Patient Details:  Cody Pena 1960-08-27 [id number]  Ortho Devices Type of Ortho Device: Postop shoe/boot Ortho Device/Splint Location: left Ortho Device/Splint Interventions: Application, Adjustment   Post Interventions Patient Tolerated: Well Instructions Provided: Adjustment of device, Care of device   Alvina ChouWilliams, Jaidynn Balster C 11/15/2017, 11:14 AM

## 2017-11-15 NOTE — Transfer of Care (Signed)
Immediate Anesthesia Transfer of Care Note  Patient: Cody CarrelRobert K Quayle  Procedure(s) Performed: LEFT FOOT 2ND RAY AMPUTATION (Left )  Patient Location: PACU  Anesthesia Type:General  Level of Consciousness: drowsy and patient cooperative  Airway & Oxygen Therapy: Patient Spontanous Breathing and Patient connected to face mask oxygen  Post-op Assessment: Report given to RN and Post -op Vital signs reviewed and stable  Post vital signs: Reviewed and stable  Last Vitals:  Vitals Value Taken Time  BP 115/65 11/15/2017  8:06 AM  Temp    Pulse 80 11/15/2017  8:06 AM  Resp 13 11/15/2017  8:06 AM  SpO2 96 % 11/15/2017  8:06 AM  Vitals shown include unvalidated device data.  Last Pain:  Vitals:   11/15/17 0533  TempSrc: Oral  PainSc:       Patients Stated Pain Goal: 1 (11/14/17 1720)  Complications: No apparent anesthesia complications

## 2017-11-15 NOTE — Progress Notes (Signed)
PROGRESS NOTE  Cody Pena ZOX:[id number] DOB: [birth year] DOA: 11/10/2017 PCP: Aida Puffer, MD  HPI/Recap of past 30 hours: 57 year old man PMH diabetes mellitus with peripheral neuropathy, followed by wound clinic for left foot wound, sent to the emergency department after outpatient MRI revealed septic arthritis and osteomyelitis of the left foot.  11/13/2017: Patient seen and examined at his bedside.  He states the pain on his left dorsal foot is well controlled on current pain medications.  He has no new complaints.  11/14/2017: Patient seen and examined at his bedside.  Has no new complaints at this time.  Orthopedic surgery planning left foot second ray amputation tomorrow   11/15/17: POD # 0 post left foot second ray amputation.  Patient seen and examined at his bedside.  States his pain is well controlled on current pain management.  Has no new complaints.  Assessment/Plan: Principal Problem:   Septic arthritis of foot (HCC) Active Problems:   Diabetic foot infection (HCC)   Osteomyelitis of ankle or foot, acute, left (HCC)   Type 2 DM with diabetic peripheral angiopathy w/o gangrene (HCC)   Cigarette smoker   Diabetic peripheral neuropathy (HCC)   Foot osteomyelitis, left (HCC)  Left dorsal foot wound/osteomyelitis status post left foot second ray amputation POD #0 Continue current pain management Orthopedic surgery following Dr. Lajoyce Corners Per orthopedic surgery continue IV antibiotic 24 hours post surgery. Discharge on either Septra DS or doxycycline p.o. times 4 weeks. No weightbearing left lower extremity Keep dressing clean dry and intact until follow-up in 1 week at [facility] Dan Humphreys or crutches at discharge Continue pain management Continue bowel regimen  Diabetes uncontrolled due to hyperglycemia Last A1c was 7.2 on 11/10/2017 Continue insulin sliding scale Avoid hypoglycemia or hyperglycemia Continue insulin sliding scale increased to moderate sliding  scale Continue Lantus 50 units subcu nightly  Peripheral vascular disease status post IR atherectomy with PTA and stent Continue statin and aspirin  Ambulatory dysfunction due to amputation PT evaluated and recommended home health PT Continue PT Fall precautions  Hypertension Blood pressure stable  Tobacco use disorder Tobacco cessation counseling at bedside     Code Status: Full code  Family Communication: None at bedside  Disposition Plan: SNF when clinically stable   Consultants:  Orthopedic surgery Procedures:  None  Antimicrobials:  None  DVT prophylaxis: SCDs   Objective: Vitals:   11/15/17 0900 11/15/17 0907 11/15/17 0926 11/15/17 0945  BP:  116/68  114/69  Pulse: 83 87 87 80  Resp: 11 12  15   Temp:   (!) 97 F (36.1 C) 98.7 F (37.1 C)  TempSrc:    Oral  SpO2: (!) 88% 96% 98% 98%  Weight:      Height:        Intake/Output Summary (Last 24 hours) at 11/15/2017 1549 Last data filed at 11/15/2017 0900 Gross per 24 hour  Intake 420 ml  Output 10 ml  Net 410 ml   Filed Weights   11/10/17 1753 11/11/17 1212  Weight: 111.1 kg (245 lb) 106.1 kg (233 lb 14.5 oz)    Exam: 11/15/17   General: 57 year old Caucasian male well-developed well-nourished in no acute distress.  Alert oriented x3.  Cardiovascular: Regular rate and rhythm with no rubs or gallops.  No JVD or thyromegaly present.  Respiratory: Clear to auscultation with no wheezes or rales.  Abdomen: Soft nontender  Normal bowel sounds x4 quadrant.  Musculoskeletal: Left dorsal foot with deep wound  Skin: As stated above  Psychiatry: Mood is appropriate for condition and setting   Data Reviewed: CBC: Recent Labs  Lab 11/08/17 1852 11/10/17 1815 11/11/17 0457 11/14/17 0936  WBC 12.6* 13.7* 11.2* 9.6  NEUTROABS 8.2* 8.9*  --   --   HGB 15.0 15.1 13.5 12.8*  HCT 44.9 44.8 40.5 38.8*  MCV 95.3 93.9 94.2 93.7  PLT 242 310 273 272   Basic Metabolic Panel: Recent Labs  Lab  11/08/17 1852 11/10/17 1815 11/11/17 0457 11/14/17 0936  NA 135 137 137 135  K 4.0 4.3 3.9 4.3  CL 97* 99* 101 97*  CO2 27 28 26 29   GLUCOSE 126* 118* 108* 121*  BUN 11 9 8 11   CREATININE 0.91 0.88 0.85 0.92  CALCIUM 9.2 9.1 8.6* 8.6*   GFR: Estimated Creatinine Clearance: 115.3 mL/min (by C-G formula based on SCr of 0.92 mg/dL). Liver Function Tests: Recent Labs  Lab 11/08/17 1852 11/10/17 1815 11/11/17 0457 11/14/17 0936  AST 16 16 15 15   ALT 22 21 18 17   ALKPHOS 78 78 74 71  BILITOT 0.4 0.4 0.4 0.7  PROT 7.4 7.5 6.1* 6.3*  ALBUMIN 3.3* 3.4* 2.9* 2.9*   No results for input(s): LIPASE, AMYLASE in the last 168 hours. No results for input(s): AMMONIA in the last 168 hours. Coagulation Profile: Recent Labs  Lab 11/08/17 1852 11/10/17 1815  INR 1.08 1.05   Cardiac Enzymes: No results for input(s): CKTOTAL, CKMB, CKMBINDEX, TROPONINI in the last 168 hours. BNP (last 3 results) No results for input(s): PROBNP in the last 8760 hours. HbA1C: No results for input(s): HGBA1C in the last 72 hours. CBG: Recent Labs  Lab 11/14/17 2220 11/15/17 0537 11/15/17 0809 11/15/17 1006 11/15/17 1233  GLUCAP 223* 166* 152* 179* 257*   Lipid Profile: No results for input(s): CHOL, HDL, LDLCALC, TRIG, CHOLHDL, LDLDIRECT in the last 72 hours. Thyroid Function Tests: No results for input(s): TSH, T4TOTAL, FREET4, T3FREE, THYROIDAB in the last 72 hours. Anemia Panel: No results for input(s): VITAMINB12, FOLATE, FERRITIN, TIBC, IRON, RETICCTPCT in the last 72 hours. Urine analysis:    Component Value Date/Time   COLORURINE YELLOW 12/07/2014 0325   APPEARANCEUR CLEAR 12/07/2014 0325   LABSPEC 1.023 12/07/2014 0325   PHURINE 5.5 12/07/2014 0325   GLUCOSEU 500 (A) 12/07/2014 0325   HGBUR NEGATIVE 12/07/2014 0325   BILIRUBINUR NEGATIVE 12/07/2014 0325   KETONESUR NEGATIVE 12/07/2014 0325   PROTEINUR NEGATIVE 12/07/2014 0325   UROBILINOGEN 1.0 12/07/2014 0325   NITRITE  NEGATIVE 12/07/2014 0325   LEUKOCYTESUR NEGATIVE 12/07/2014 0325   Sepsis Labs: @LABRCNTIP (procalcitonin:4,lacticidven:4)  ) Recent Results (from the past 240 hour(s))  Culture, blood (Routine x 2)     Status: None   Collection Time: 11/08/17  6:50 PM  Result Value Ref Range Status   Specimen Description BLOOD RIGHT ANTECUBITAL  Final   Special Requests   Final    BOTTLES DRAWN AEROBIC AND ANAEROBIC Blood Culture adequate volume   Culture   Final    NO GROWTH 5 DAYS Performed at [facility], [address] [place], [place] [address]    Report Status 11/13/2017 FINAL  Final  Culture, blood (Routine x 2)     Status: None   Collection Time: 11/10/17  6:15 PM  Result Value Ref Range Status   Specimen Description BLOOD RIGHT ANTECUBITAL  Final   Special Requests   Final    BOTTLES DRAWN AEROBIC AND ANAEROBIC Blood Culture adequate volume   Culture   Final  NO GROWTH 5 DAYS Performed at [facility], [address] [place], [place] [address]    Report Status 11/15/2017 FINAL  Final  Culture, blood (Routine x 2)     Status: None   Collection Time: 11/10/17  7:50 PM  Result Value Ref Range Status   Specimen Description BLOOD LEFT ANTECUBITAL  Final   Special Requests IN PEDIATRIC BOTTLE Blood Culture adequate volume  Final   Culture   Final    NO GROWTH 5 DAYS Performed at [facility], [address] [place], [place] [address]    Report Status 11/15/2017 FINAL  Final  Wound or Superficial Culture     Status: None   Collection Time: 11/10/17  8:41 PM  Result Value Ref Range Status   Specimen Description WOUND LEFT FOOT  Final   Special Requests NONE  Final   Gram Stain   Final    MODERATE WBC PRESENT, PREDOMINANTLY PMN RARE GRAM POSITIVE COCCI    Culture   Final    NORMAL SKIN FLORA Performed at [facility], [address] [place], [place] [address]    Report Status 11/14/2017 FINAL  Final  Aerobic/Anaerobic Culture (surgical/deep wound)      Status: None (Preliminary result)   Collection Time: 11/15/17  7:48 AM  Result Value Ref Range Status   Specimen Description TISSUE  Final   Special Requests LEFT FOOT AMPUTATION  Final   Gram Stain   Final    FEW WBC PRESENT, PREDOMINANTLY PMN RARE GRAM POSITIVE COCCI RARE GRAM POSITIVE RODS Performed at [facility], [address] [place], [place] [address]    Culture PENDING  Incomplete   Report Status PENDING  Incomplete      Studies: Ir Angiogram Extremity Left  Result Date: 11/15/2017 INDICATION: 57 year old male with a history of critical limb ischemia left foot. He presents for revascularization of tibial vasculature for wound healing capability. EXAM: ULTRASOUND GUIDED ACCESS LEFT COMMON FEMORAL ARTERY IMAGE GUIDED ACCESS LEFT DORSALIS PEDIS BALLOON ANGIOPLASTY OF ANTERIOR TIBIAL ARTERY BALLOON ANGIOPLASTY DORSALIS PEDIS ARTERY BALLOON ANGIOPLASTY OF LATERAL PLANTAR ARTERY AND DEEP PERFORATING ARTERY TREATMENT OF PROXIMAL ANTERIOR TIBIAL ARTERY WITH DRUG-ELUTING BALLOON TECHNOLOGY DEPLOYMENT OF ANGIO-SEAL FOR HEMOSTASIS MEDICATIONS: 12000 units heparin, 40 mg protamine, 1000 mcg nitroglycerin, 6 mg intra arterial tPA ANESTHESIA/SEDATION: Moderate (conscious) sedation was employed during this procedure. A total of Versed 6 mg and Fentanyl 250 mcg was administered intravenously. Moderate Sedation Time: 195 minutes. The patient's level of consciousness and vital signs were monitored continuously by radiology nursing throughout the procedure under my direct supervision. CONTRAST:  180 cc Isovue FLUOROSCOPY TIME:  Fluoroscopy Time: 39 minutes 12 seconds (292 mGy) COMPLICATIONS: None PROCEDURE: Informed consent was obtained from the patient following explanation of the procedure, risks, benefits and alternatives. The patient understands, agrees and consents for the procedure. All questions were addressed. A time out was performed prior to the initiation of the procedure. Maximal  barrier sterile technique utilized including caps, mask, sterile gowns, sterile gloves, large sterile drape, hand hygiene, and Betadine prep. Ultrasound survey of the left inguinal region was performed with images stored and sent to PACs. 1% lidocaine was used for local anesthesia. Small stab incision was made with 11 blade scalpel. Blunt dissection was performed, to greater tract for the anticipated closure device. A micropuncture needle was used access the left common femoral artery under ultrasound antegrade. With excellent arterial blood flow returned, and an .018 micro wire was passed  through the needle, observed enter the SFA under fluoroscopy. The needle was removed, and a micropuncture sheath was placed over the wire. Gentle contrast injection performed. The inner dilator and wire were removed, and an 035 Bentson wire was advanced under fluoroscopy into the SFA. The sheath was removed and a standard 5 [place] vascular sheath was placed. The dilator was removed and the sheath was flushed. Angiogram was performed of the left lower extremity. A 6 French 60 cm standard sheath was then placed over the Bentson wire. A combination of 035, 018, 014 crossing catheters were used to probe a small branch from the below knee popliteal artery/tibioperoneal artery as the possible origin of anterior tibial artery at the knee. Various wires were also used, including standard Glidewire for access, gold tip 018 glidewire, 014 abbott command wire. 016 fathom wire also used for accessing the collateral. After probing the small collateral, it was evident that this was only a collateral and there was likely a flush occlusion of the anterior tibial artery. 018 crossing catheter was then advanced into the posterior tibial artery for addressing a retrograde approach to the pedal loop. 018 CX I crossing catheter and 014 command wire were used to address the distal occlusion of the deep perforating artery at the plantar loop. Using AP  and lateral, the occlusion was navigated in a retrograde fashion through the dorsalis pedis and distal anterior tibial artery. Wire purchase was achieved with the 150 cm crossing catheter into the distal anterior tibial artery. Once the crossing catheter was hubbed, the command wire was removed and an 014 [place] core wire was placed. Balloon angioplasty was then performed of the pedal loop, deep perforating artery, dorsalis pedis lateral plantar artery, and the distal anterior tibial artery. 2 mm balloon angioplasty was first performed, and then 2.5 mm angioplasty in the distal anterior tibial artery. Angiogram performed demonstrated poor retrograde filling of the anterior tibial artery. In order to retrograde recanalized the anterior tibial artery, the 2 mm angioplasty balloon was positioned at the instep, transition of anterior tibial artery to dorsalis pedis, inflated, and then punctured directly under fluoroscopic visualization. With the needle within the angioplasty balloon, av 18 wire was threaded into the balloon, which was passed retrograde. Once the needle was removed and the wire had a few cm of purchase, the angioplasty balloon was removed. The 18 wire remain place within the dorsalis pedis. Four French slender sheath was then placed into the distal anterior tibial artery. The V18 wire was removed, and retrograde crossing of the anterior tibial artery was performed with a standard Glidewire and 035 crossing catheter. With the crossing catheter positioned in the popliteal artery, the wire was removed, and gentle injection confirmed luminal position. Command wire was then placed retrograde into the superficial femoral artery. Balloon angioplasty was then performed of the length of the anterior tibial artery using 2 mm, 3 mm, 3.5 mm along the length. A series of balloon angioplasty was then performed using 2 mm, 2.5 mm, 3 mm angioplasty through the lateral plantar artery, pedal loop, deep perforating artery,  dorsalis pedis, and anterior tibial artery. The balloon angioplasty was performed both through the anterior tibial artery and from the antegrade common femoral access. Repeat angiogram was demonstrating poor flow throughout this series of balloon angioplasty. Ultimately, antegrade access was performed through the 6 French sheath at the common femoral artery into the anterior tibial artery origin. 014 wire was placed distally, through the pedal loop into the lateral plantar artery. Antegrade balloon  angioplasty along the length of the anterior tibial artery, dosalis pedis, and pedal loop was performed. The origin of the anterior tibial artery was treated with a 4 mm x 80 mm drug-eluting balloon. Completion balloon angioplasty was performed again through the pedal loop. Final angiogram was performed. Reversal of heparin was performed and then Angio-Seal was deployed at the left common femoral artery access. A combination of manual pressure and internal balloon angioplasty across the puncture site at the ankle was performed for hemostasis at the foot. Patient tolerated the procedure well and remained hemodynamically stable throughout. No complications were encountered and no significant blood loss. IMPRESSION: Status post left lower extremity angiogram, revascularization of occluded anterior tibial artery with angioplasty of anterior tibial artery, dorsalis pedis, deep perforating artery, lateral plantar artery, and reestablishing continuous flow through the pedal loop with 3 vessel tibial flow to the ankle. Status post deployment of Angio-Seal at the left common femoral artery Sig Yvone Neu. Loreta Ave, DO [facility] Radiology Electronically Signed   By: Gilmer Mor D.O.   On: 11/15/2017 09:19   Ir Angiogram Selective Each Additional Vessel  Result Date: 11/15/2017 INDICATION: 57 year old male with a history of critical limb ischemia left foot. He presents for  revascularization of tibial vasculature for wound healing capability. EXAM: ULTRASOUND GUIDED ACCESS LEFT COMMON FEMORAL ARTERY IMAGE GUIDED ACCESS LEFT DORSALIS PEDIS BALLOON ANGIOPLASTY OF ANTERIOR TIBIAL ARTERY BALLOON ANGIOPLASTY DORSALIS PEDIS ARTERY BALLOON ANGIOPLASTY OF LATERAL PLANTAR ARTERY AND DEEP PERFORATING ARTERY TREATMENT OF PROXIMAL ANTERIOR TIBIAL ARTERY WITH DRUG-ELUTING BALLOON TECHNOLOGY DEPLOYMENT OF ANGIO-SEAL FOR HEMOSTASIS MEDICATIONS: 12000 units heparin, 40 mg protamine, 1000 mcg nitroglycerin, 6 mg intra arterial tPA ANESTHESIA/SEDATION: Moderate (conscious) sedation was employed during this procedure. A total of Versed 6 mg and Fentanyl 250 mcg was administered intravenously. Moderate Sedation Time: 195 minutes. The patient's level of consciousness and vital signs were monitored continuously by radiology nursing throughout the procedure under my direct supervision. CONTRAST:  180 cc Isovue FLUOROSCOPY TIME:  Fluoroscopy Time: 39 minutes 12 seconds (292 mGy) COMPLICATIONS: None PROCEDURE: Informed consent was obtained from the patient following explanation of the procedure, risks, benefits and alternatives. The patient understands, agrees and consents for the procedure. All questions were addressed. A time out was performed prior to the initiation of the procedure. Maximal barrier sterile technique utilized including caps, mask, sterile gowns, sterile gloves, large sterile drape, hand hygiene, and Betadine prep. Ultrasound survey of the left inguinal region was performed with images stored and sent to PACs. 1% lidocaine was used for local anesthesia. Small stab incision was made with 11 blade scalpel. Blunt dissection was performed, to greater tract for the anticipated closure device. A micropuncture needle was used access the left common femoral artery under ultrasound antegrade. With excellent arterial blood flow returned, and an .018 micro wire was passed through the needle, observed  enter the SFA under fluoroscopy. The needle was removed, and a micropuncture sheath was placed over the wire. Gentle contrast injection performed. The inner dilator and wire were removed, and an 035 Bentson wire was advanced under fluoroscopy into the SFA. The sheath was removed and a standard 5 [place] vascular sheath was placed. The dilator was removed and the sheath was flushed. Angiogram was performed of the left lower extremity. A 6 French 60 cm standard sheath was then placed over the Bentson wire. A combination of 035, 018, 014 crossing catheters were used to probe a small branch from the below knee popliteal artery/tibioperoneal artery as  the possible origin of anterior tibial artery at the knee. Various wires were also used, including standard Glidewire for access, gold tip 018 glidewire, 014 abbott command wire. 016 fathom wire also used for accessing the collateral. After probing the small collateral, it was evident that this was only a collateral and there was likely a flush occlusion of the anterior tibial artery. 018 crossing catheter was then advanced into the posterior tibial artery for addressing a retrograde approach to the pedal loop. 018 CX I crossing catheter and 014 command wire were used to address the distal occlusion of the deep perforating artery at the plantar loop. Using AP and lateral, the occlusion was navigated in a retrograde fashion through the dorsalis pedis and distal anterior tibial artery. Wire purchase was achieved with the 150 cm crossing catheter into the distal anterior tibial artery. Once the crossing catheter was hubbed, the command wire was removed and an 014 [place] core wire was placed. Balloon angioplasty was then performed of the pedal loop, deep perforating artery, dorsalis pedis lateral plantar artery, and the distal anterior tibial artery. 2 mm balloon angioplasty was first performed, and then 2.5 mm angioplasty in the distal anterior tibial artery. Angiogram  performed demonstrated poor retrograde filling of the anterior tibial artery. In order to retrograde recanalized the anterior tibial artery, the 2 mm angioplasty balloon was positioned at the instep, transition of anterior tibial artery to dorsalis pedis, inflated, and then punctured directly under fluoroscopic visualization. With the needle within the angioplasty balloon, av 18 wire was threaded into the balloon, which was passed retrograde. Once the needle was removed and the wire had a few cm of purchase, the angioplasty balloon was removed. The 18 wire remain place within the dorsalis pedis. Four French slender sheath was then placed into the distal anterior tibial artery. The V18 wire was removed, and retrograde crossing of the anterior tibial artery was performed with a standard Glidewire and 035 crossing catheter. With the crossing catheter positioned in the popliteal artery, the wire was removed, and gentle injection confirmed luminal position. Command wire was then placed retrograde into the superficial femoral artery. Balloon angioplasty was then performed of the length of the anterior tibial artery using 2 mm, 3 mm, 3.5 mm along the length. A series of balloon angioplasty was then performed using 2 mm, 2.5 mm, 3 mm angioplasty through the lateral plantar artery, pedal loop, deep perforating artery, dorsalis pedis, and anterior tibial artery. The balloon angioplasty was performed both through the anterior tibial artery and from the antegrade common femoral access. Repeat angiogram was demonstrating poor flow throughout this series of balloon angioplasty. Ultimately, antegrade access was performed through the 6 French sheath at the common femoral artery into the anterior tibial artery origin. 014 wire was placed distally, through the pedal loop into the lateral plantar artery. Antegrade balloon angioplasty along the length of the anterior tibial artery, dosalis pedis, and pedal loop was performed. The  origin of the anterior tibial artery was treated with a 4 mm x 80 mm drug-eluting balloon. Completion balloon angioplasty was performed again through the pedal loop. Final angiogram was performed. Reversal of heparin was performed and then Angio-Seal was deployed at the left common femoral artery access. A combination of manual pressure and internal balloon angioplasty across the puncture site at the ankle was performed for hemostasis at the foot. Patient tolerated the procedure well and remained hemodynamically stable throughout. No complications were encountered and no significant blood loss. IMPRESSION: Status post left lower  extremity angiogram, revascularization of occluded anterior tibial artery with angioplasty of anterior tibial artery, dorsalis pedis, deep perforating artery, lateral plantar artery, and reestablishing continuous flow through the pedal loop with 3 vessel tibial flow to the ankle. Status post deployment of Angio-Seal at the left common femoral artery Sig Yvone Neu. Loreta Ave, DO [facility] Radiology Electronically Signed   By: Gilmer Mor D.O.   On: 11/15/2017 09:19   Ir Angiogram Selective Each Additional Vessel  Result Date: 11/15/2017 INDICATION: 57 year old male with a history of critical limb ischemia left foot. He presents for revascularization of tibial vasculature for wound healing capability. EXAM: ULTRASOUND GUIDED ACCESS LEFT COMMON FEMORAL ARTERY IMAGE GUIDED ACCESS LEFT DORSALIS PEDIS BALLOON ANGIOPLASTY OF ANTERIOR TIBIAL ARTERY BALLOON ANGIOPLASTY DORSALIS PEDIS ARTERY BALLOON ANGIOPLASTY OF LATERAL PLANTAR ARTERY AND DEEP PERFORATING ARTERY TREATMENT OF PROXIMAL ANTERIOR TIBIAL ARTERY WITH DRUG-ELUTING BALLOON TECHNOLOGY DEPLOYMENT OF ANGIO-SEAL FOR HEMOSTASIS MEDICATIONS: 12000 units heparin, 40 mg protamine, 1000 mcg nitroglycerin, 6 mg intra arterial tPA ANESTHESIA/SEDATION: Moderate (conscious) sedation was employed during  this procedure. A total of Versed 6 mg and Fentanyl 250 mcg was administered intravenously. Moderate Sedation Time: 195 minutes. The patient's level of consciousness and vital signs were monitored continuously by radiology nursing throughout the procedure under my direct supervision. CONTRAST:  180 cc Isovue FLUOROSCOPY TIME:  Fluoroscopy Time: 39 minutes 12 seconds (292 mGy) COMPLICATIONS: None PROCEDURE: Informed consent was obtained from the patient following explanation of the procedure, risks, benefits and alternatives. The patient understands, agrees and consents for the procedure. All questions were addressed. A time out was performed prior to the initiation of the procedure. Maximal barrier sterile technique utilized including caps, mask, sterile gowns, sterile gloves, large sterile drape, hand hygiene, and Betadine prep. Ultrasound survey of the left inguinal region was performed with images stored and sent to PACs. 1% lidocaine was used for local anesthesia. Small stab incision was made with 11 blade scalpel. Blunt dissection was performed, to greater tract for the anticipated closure device. A micropuncture needle was used access the left common femoral artery under ultrasound antegrade. With excellent arterial blood flow returned, and an .018 micro wire was passed through the needle, observed enter the SFA under fluoroscopy. The needle was removed, and a micropuncture sheath was placed over the wire. Gentle contrast injection performed. The inner dilator and wire were removed, and an 035 Bentson wire was advanced under fluoroscopy into the SFA. The sheath was removed and a standard 5 [place] vascular sheath was placed. The dilator was removed and the sheath was flushed. Angiogram was performed of the left lower extremity. A 6 French 60 cm standard sheath was then placed over the Bentson wire. A combination of 035, 018, 014 crossing catheters were used to probe a small branch from the below knee popliteal  artery/tibioperoneal artery as the possible origin of anterior tibial artery at the knee. Various wires were also used, including standard Glidewire for access, gold tip 018 glidewire, 014 abbott command wire. 016 fathom wire also used for accessing the collateral. After probing the small collateral, it was evident that this was only a collateral and there was likely a flush occlusion of the anterior tibial artery. 018 crossing catheter was then advanced into the posterior tibial artery for addressing a retrograde approach to the pedal loop. 018 CX I crossing catheter and 014 command wire were used to address the distal occlusion of the deep perforating artery at the plantar loop. Using AP and lateral,  the occlusion was navigated in a retrograde fashion through the dorsalis pedis and distal anterior tibial artery. Wire purchase was achieved with the 150 cm crossing catheter into the distal anterior tibial artery. Once the crossing catheter was hubbed, the command wire was removed and an 014 [place] core wire was placed. Balloon angioplasty was then performed of the pedal loop, deep perforating artery, dorsalis pedis lateral plantar artery, and the distal anterior tibial artery. 2 mm balloon angioplasty was first performed, and then 2.5 mm angioplasty in the distal anterior tibial artery. Angiogram performed demonstrated poor retrograde filling of the anterior tibial artery. In order to retrograde recanalized the anterior tibial artery, the 2 mm angioplasty balloon was positioned at the instep, transition of anterior tibial artery to dorsalis pedis, inflated, and then punctured directly under fluoroscopic visualization. With the needle within the angioplasty balloon, av 18 wire was threaded into the balloon, which was passed retrograde. Once the needle was removed and the wire had a few cm of purchase, the angioplasty balloon was removed. The 18 wire remain place within the dorsalis pedis. Four French slender sheath  was then placed into the distal anterior tibial artery. The V18 wire was removed, and retrograde crossing of the anterior tibial artery was performed with a standard Glidewire and 035 crossing catheter. With the crossing catheter positioned in the popliteal artery, the wire was removed, and gentle injection confirmed luminal position. Command wire was then placed retrograde into the superficial femoral artery. Balloon angioplasty was then performed of the length of the anterior tibial artery using 2 mm, 3 mm, 3.5 mm along the length. A series of balloon angioplasty was then performed using 2 mm, 2.5 mm, 3 mm angioplasty through the lateral plantar artery, pedal loop, deep perforating artery, dorsalis pedis, and anterior tibial artery. The balloon angioplasty was performed both through the anterior tibial artery and from the antegrade common femoral access. Repeat angiogram was demonstrating poor flow throughout this series of balloon angioplasty. Ultimately, antegrade access was performed through the 6 French sheath at the common femoral artery into the anterior tibial artery origin. 014 wire was placed distally, through the pedal loop into the lateral plantar artery. Antegrade balloon angioplasty along the length of the anterior tibial artery, dosalis pedis, and pedal loop was performed. The origin of the anterior tibial artery was treated with a 4 mm x 80 mm drug-eluting balloon. Completion balloon angioplasty was performed again through the pedal loop. Final angiogram was performed. Reversal of heparin was performed and then Angio-Seal was deployed at the left common femoral artery access. A combination of manual pressure and internal balloon angioplasty across the puncture site at the ankle was performed for hemostasis at the foot. Patient tolerated the procedure well and remained hemodynamically stable throughout. No complications were encountered and no significant blood loss. IMPRESSION: Status post left  lower extremity angiogram, revascularization of occluded anterior tibial artery with angioplasty of anterior tibial artery, dorsalis pedis, deep perforating artery, lateral plantar artery, and reestablishing continuous flow through the pedal loop with 3 vessel tibial flow to the ankle. Status post deployment of Angio-Seal at the left common femoral artery Sig Yvone Neu. Loreta Ave, DO [facility] Radiology Electronically Signed   By: Gilmer Mor D.O.   On: 11/15/2017 09:19   Ir Tib-pero Art Pta Mod Sed  Result Date: 11/15/2017 INDICATION: 57 year old male with a history of critical limb ischemia left foot. He presents for revascularization of tibial vasculature for wound healing capability. EXAM: ULTRASOUND  GUIDED ACCESS LEFT COMMON FEMORAL ARTERY IMAGE GUIDED ACCESS LEFT DORSALIS PEDIS BALLOON ANGIOPLASTY OF ANTERIOR TIBIAL ARTERY BALLOON ANGIOPLASTY DORSALIS PEDIS ARTERY BALLOON ANGIOPLASTY OF LATERAL PLANTAR ARTERY AND DEEP PERFORATING ARTERY TREATMENT OF PROXIMAL ANTERIOR TIBIAL ARTERY WITH DRUG-ELUTING BALLOON TECHNOLOGY DEPLOYMENT OF ANGIO-SEAL FOR HEMOSTASIS MEDICATIONS: 12000 units heparin, 40 mg protamine, 1000 mcg nitroglycerin, 6 mg intra arterial tPA ANESTHESIA/SEDATION: Moderate (conscious) sedation was employed during this procedure. A total of Versed 6 mg and Fentanyl 250 mcg was administered intravenously. Moderate Sedation Time: 195 minutes. The patient's level of consciousness and vital signs were monitored continuously by radiology nursing throughout the procedure under my direct supervision. CONTRAST:  180 cc Isovue FLUOROSCOPY TIME:  Fluoroscopy Time: 39 minutes 12 seconds (292 mGy) COMPLICATIONS: None PROCEDURE: Informed consent was obtained from the patient following explanation of the procedure, risks, benefits and alternatives. The patient understands, agrees and consents for the procedure. All questions were addressed. A time out was performed  prior to the initiation of the procedure. Maximal barrier sterile technique utilized including caps, mask, sterile gowns, sterile gloves, large sterile drape, hand hygiene, and Betadine prep. Ultrasound survey of the left inguinal region was performed with images stored and sent to PACs. 1% lidocaine was used for local anesthesia. Small stab incision was made with 11 blade scalpel. Blunt dissection was performed, to greater tract for the anticipated closure device. A micropuncture needle was used access the left common femoral artery under ultrasound antegrade. With excellent arterial blood flow returned, and an .018 micro wire was passed through the needle, observed enter the SFA under fluoroscopy. The needle was removed, and a micropuncture sheath was placed over the wire. Gentle contrast injection performed. The inner dilator and wire were removed, and an 035 Bentson wire was advanced under fluoroscopy into the SFA. The sheath was removed and a standard 5 [place] vascular sheath was placed. The dilator was removed and the sheath was flushed. Angiogram was performed of the left lower extremity. A 6 French 60 cm standard sheath was then placed over the Bentson wire. A combination of 035, 018, 014 crossing catheters were used to probe a small branch from the below knee popliteal artery/tibioperoneal artery as the possible origin of anterior tibial artery at the knee. Various wires were also used, including standard Glidewire for access, gold tip 018 glidewire, 014 abbott command wire. 016 fathom wire also used for accessing the collateral. After probing the small collateral, it was evident that this was only a collateral and there was likely a flush occlusion of the anterior tibial artery. 018 crossing catheter was then advanced into the posterior tibial artery for addressing a retrograde approach to the pedal loop. 018 CX I crossing catheter and 014 command wire were used to address the distal occlusion of the deep  perforating artery at the plantar loop. Using AP and lateral, the occlusion was navigated in a retrograde fashion through the dorsalis pedis and distal anterior tibial artery. Wire purchase was achieved with the 150 cm crossing catheter into the distal anterior tibial artery. Once the crossing catheter was hubbed, the command wire was removed and an 014 [place] core wire was placed. Balloon angioplasty was then performed of the pedal loop, deep perforating artery, dorsalis pedis lateral plantar artery, and the distal anterior tibial artery. 2 mm balloon angioplasty was first performed, and then 2.5 mm angioplasty in the distal anterior tibial artery. Angiogram performed demonstrated poor retrograde filling of the anterior tibial artery. In order to retrograde recanalized the anterior tibial  artery, the 2 mm angioplasty balloon was positioned at the instep, transition of anterior tibial artery to dorsalis pedis, inflated, and then punctured directly under fluoroscopic visualization. With the needle within the angioplasty balloon, av 18 wire was threaded into the balloon, which was passed retrograde. Once the needle was removed and the wire had a few cm of purchase, the angioplasty balloon was removed. The 18 wire remain place within the dorsalis pedis. Four French slender sheath was then placed into the distal anterior tibial artery. The V18 wire was removed, and retrograde crossing of the anterior tibial artery was performed with a standard Glidewire and 035 crossing catheter. With the crossing catheter positioned in the popliteal artery, the wire was removed, and gentle injection confirmed luminal position. Command wire was then placed retrograde into the superficial femoral artery. Balloon angioplasty was then performed of the length of the anterior tibial artery using 2 mm, 3 mm, 3.5 mm along the length. A series of balloon angioplasty was then performed using 2 mm, 2.5 mm, 3 mm angioplasty through the lateral  plantar artery, pedal loop, deep perforating artery, dorsalis pedis, and anterior tibial artery. The balloon angioplasty was performed both through the anterior tibial artery and from the antegrade common femoral access. Repeat angiogram was demonstrating poor flow throughout this series of balloon angioplasty. Ultimately, antegrade access was performed through the 6 French sheath at the common femoral artery into the anterior tibial artery origin. 014 wire was placed distally, through the pedal loop into the lateral plantar artery. Antegrade balloon angioplasty along the length of the anterior tibial artery, dosalis pedis, and pedal loop was performed. The origin of the anterior tibial artery was treated with a 4 mm x 80 mm drug-eluting balloon. Completion balloon angioplasty was performed again through the pedal loop. Final angiogram was performed. Reversal of heparin was performed and then Angio-Seal was deployed at the left common femoral artery access. A combination of manual pressure and internal balloon angioplasty across the puncture site at the ankle was performed for hemostasis at the foot. Patient tolerated the procedure well and remained hemodynamically stable throughout. No complications were encountered and no significant blood loss. IMPRESSION: Status post left lower extremity angiogram, revascularization of occluded anterior tibial artery with angioplasty of anterior tibial artery, dorsalis pedis, deep perforating artery, lateral plantar artery, and reestablishing continuous flow through the pedal loop with 3 vessel tibial flow to the ankle. Status post deployment of Angio-Seal at the left common femoral artery Sig Yvone Neu. Loreta Ave, DO [facility] Radiology Electronically Signed   By: Gilmer Mor D.O.   On: 11/15/2017 09:19   Ir Tib-pero Art Uni Pta Ea Add Vessel Mod Sed  Result Date: 11/15/2017 INDICATION: 57 year old male with a history of  critical limb ischemia left foot. He presents for revascularization of tibial vasculature for wound healing capability. EXAM: ULTRASOUND GUIDED ACCESS LEFT COMMON FEMORAL ARTERY IMAGE GUIDED ACCESS LEFT DORSALIS PEDIS BALLOON ANGIOPLASTY OF ANTERIOR TIBIAL ARTERY BALLOON ANGIOPLASTY DORSALIS PEDIS ARTERY BALLOON ANGIOPLASTY OF LATERAL PLANTAR ARTERY AND DEEP PERFORATING ARTERY TREATMENT OF PROXIMAL ANTERIOR TIBIAL ARTERY WITH DRUG-ELUTING BALLOON TECHNOLOGY DEPLOYMENT OF ANGIO-SEAL FOR HEMOSTASIS MEDICATIONS: 12000 units heparin, 40 mg protamine, 1000 mcg nitroglycerin, 6 mg intra arterial tPA ANESTHESIA/SEDATION: Moderate (conscious) sedation was employed during this procedure. A total of Versed 6 mg and Fentanyl 250 mcg was administered intravenously. Moderate Sedation Time: 195 minutes. The patient's level of consciousness and vital signs were monitored continuously by radiology nursing throughout the procedure under  my direct supervision. CONTRAST:  180 cc Isovue FLUOROSCOPY TIME:  Fluoroscopy Time: 39 minutes 12 seconds (292 mGy) COMPLICATIONS: None PROCEDURE: Informed consent was obtained from the patient following explanation of the procedure, risks, benefits and alternatives. The patient understands, agrees and consents for the procedure. All questions were addressed. A time out was performed prior to the initiation of the procedure. Maximal barrier sterile technique utilized including caps, mask, sterile gowns, sterile gloves, large sterile drape, hand hygiene, and Betadine prep. Ultrasound survey of the left inguinal region was performed with images stored and sent to PACs. 1% lidocaine was used for local anesthesia. Small stab incision was made with 11 blade scalpel. Blunt dissection was performed, to greater tract for the anticipated closure device. A micropuncture needle was used access the left common femoral artery under ultrasound antegrade. With excellent arterial blood flow returned, and an .018  micro wire was passed through the needle, observed enter the SFA under fluoroscopy. The needle was removed, and a micropuncture sheath was placed over the wire. Gentle contrast injection performed. The inner dilator and wire were removed, and an 035 Bentson wire was advanced under fluoroscopy into the SFA. The sheath was removed and a standard 5 [place] vascular sheath was placed. The dilator was removed and the sheath was flushed. Angiogram was performed of the left lower extremity. A 6 French 60 cm standard sheath was then placed over the Bentson wire. A combination of 035, 018, 014 crossing catheters were used to probe a small branch from the below knee popliteal artery/tibioperoneal artery as the possible origin of anterior tibial artery at the knee. Various wires were also used, including standard Glidewire for access, gold tip 018 glidewire, 014 abbott command wire. 016 fathom wire also used for accessing the collateral. After probing the small collateral, it was evident that this was only a collateral and there was likely a flush occlusion of the anterior tibial artery. 018 crossing catheter was then advanced into the posterior tibial artery for addressing a retrograde approach to the pedal loop. 018 CX I crossing catheter and 014 command wire were used to address the distal occlusion of the deep perforating artery at the plantar loop. Using AP and lateral, the occlusion was navigated in a retrograde fashion through the dorsalis pedis and distal anterior tibial artery. Wire purchase was achieved with the 150 cm crossing catheter into the distal anterior tibial artery. Once the crossing catheter was hubbed, the command wire was removed and an 014 [place] core wire was placed. Balloon angioplasty was then performed of the pedal loop, deep perforating artery, dorsalis pedis lateral plantar artery, and the distal anterior tibial artery. 2 mm balloon angioplasty was first performed, and then 2.5 mm angioplasty in  the distal anterior tibial artery. Angiogram performed demonstrated poor retrograde filling of the anterior tibial artery. In order to retrograde recanalized the anterior tibial artery, the 2 mm angioplasty balloon was positioned at the instep, transition of anterior tibial artery to dorsalis pedis, inflated, and then punctured directly under fluoroscopic visualization. With the needle within the angioplasty balloon, av 18 wire was threaded into the balloon, which was passed retrograde. Once the needle was removed and the wire had a few cm of purchase, the angioplasty balloon was removed. The 18 wire remain place within the dorsalis pedis. Four French slender sheath was then placed into the distal anterior tibial artery. The V18 wire was removed, and retrograde crossing of the anterior tibial artery was performed with a standard Glidewire and 035  crossing catheter. With the crossing catheter positioned in the popliteal artery, the wire was removed, and gentle injection confirmed luminal position. Command wire was then placed retrograde into the superficial femoral artery. Balloon angioplasty was then performed of the length of the anterior tibial artery using 2 mm, 3 mm, 3.5 mm along the length. A series of balloon angioplasty was then performed using 2 mm, 2.5 mm, 3 mm angioplasty through the lateral plantar artery, pedal loop, deep perforating artery, dorsalis pedis, and anterior tibial artery. The balloon angioplasty was performed both through the anterior tibial artery and from the antegrade common femoral access. Repeat angiogram was demonstrating poor flow throughout this series of balloon angioplasty. Ultimately, antegrade access was performed through the 6 French sheath at the common femoral artery into the anterior tibial artery origin. 014 wire was placed distally, through the pedal loop into the lateral plantar artery. Antegrade balloon angioplasty along the length of the anterior tibial artery, dosalis  pedis, and pedal loop was performed. The origin of the anterior tibial artery was treated with a 4 mm x 80 mm drug-eluting balloon. Completion balloon angioplasty was performed again through the pedal loop. Final angiogram was performed. Reversal of heparin was performed and then Angio-Seal was deployed at the left common femoral artery access. A combination of manual pressure and internal balloon angioplasty across the puncture site at the ankle was performed for hemostasis at the foot. Patient tolerated the procedure well and remained hemodynamically stable throughout. No complications were encountered and no significant blood loss. IMPRESSION: Status post left lower extremity angiogram, revascularization of occluded anterior tibial artery with angioplasty of anterior tibial artery, dorsalis pedis, deep perforating artery, lateral plantar artery, and reestablishing continuous flow through the pedal loop with 3 vessel tibial flow to the ankle. Status post deployment of Angio-Seal at the left common femoral artery Sig Yvone Neu. Loreta Ave, DO [facility] Radiology Electronically Signed   By: Gilmer Mor D.O.   On: 11/15/2017 09:19   Ir US Guide Vasc Access Left  Result Date: 11/15/2017 INDICATION: 57 year old male with a history of critical limb ischemia left foot. He presents for revascularization of tibial vasculature for wound healing capability. EXAM: ULTRASOUND GUIDED ACCESS LEFT COMMON FEMORAL ARTERY IMAGE GUIDED ACCESS LEFT DORSALIS PEDIS BALLOON ANGIOPLASTY OF ANTERIOR TIBIAL ARTERY BALLOON ANGIOPLASTY DORSALIS PEDIS ARTERY BALLOON ANGIOPLASTY OF LATERAL PLANTAR ARTERY AND DEEP PERFORATING ARTERY TREATMENT OF PROXIMAL ANTERIOR TIBIAL ARTERY WITH DRUG-ELUTING BALLOON TECHNOLOGY DEPLOYMENT OF ANGIO-SEAL FOR HEMOSTASIS MEDICATIONS: 12000 units heparin, 40 mg protamine, 1000 mcg nitroglycerin, 6 mg intra arterial tPA ANESTHESIA/SEDATION: Moderate (conscious)  sedation was employed during this procedure. A total of Versed 6 mg and Fentanyl 250 mcg was administered intravenously. Moderate Sedation Time: 195 minutes. The patient's level of consciousness and vital signs were monitored continuously by radiology nursing throughout the procedure under my direct supervision. CONTRAST:  180 cc Isovue FLUOROSCOPY TIME:  Fluoroscopy Time: 39 minutes 12 seconds (292 mGy) COMPLICATIONS: None PROCEDURE: Informed consent was obtained from the patient following explanation of the procedure, risks, benefits and alternatives. The patient understands, agrees and consents for the procedure. All questions were addressed. A time out was performed prior to the initiation of the procedure. Maximal barrier sterile technique utilized including caps, mask, sterile gowns, sterile gloves, large sterile drape, hand hygiene, and Betadine prep. Ultrasound survey of the left inguinal region was performed with images stored and sent to PACs. 1% lidocaine was used for local anesthesia. Small stab incision was made with  11 blade scalpel. Blunt dissection was performed, to greater tract for the anticipated closure device. A micropuncture needle was used access the left common femoral artery under ultrasound antegrade. With excellent arterial blood flow returned, and an .018 micro wire was passed through the needle, observed enter the SFA under fluoroscopy. The needle was removed, and a micropuncture sheath was placed over the wire. Gentle contrast injection performed. The inner dilator and wire were removed, and an 035 Bentson wire was advanced under fluoroscopy into the SFA. The sheath was removed and a standard 5 [place] vascular sheath was placed. The dilator was removed and the sheath was flushed. Angiogram was performed of the left lower extremity. A 6 French 60 cm standard sheath was then placed over the Bentson wire. A combination of 035, 018, 014 crossing catheters were used to probe a small branch  from the below knee popliteal artery/tibioperoneal artery as the possible origin of anterior tibial artery at the knee. Various wires were also used, including standard Glidewire for access, gold tip 018 glidewire, 014 abbott command wire. 016 fathom wire also used for accessing the collateral. After probing the small collateral, it was evident that this was only a collateral and there was likely a flush occlusion of the anterior tibial artery. 018 crossing catheter was then advanced into the posterior tibial artery for addressing a retrograde approach to the pedal loop. 018 CX I crossing catheter and 014 command wire were used to address the distal occlusion of the deep perforating artery at the plantar loop. Using AP and lateral, the occlusion was navigated in a retrograde fashion through the dorsalis pedis and distal anterior tibial artery. Wire purchase was achieved with the 150 cm crossing catheter into the distal anterior tibial artery. Once the crossing catheter was hubbed, the command wire was removed and an 014 [place] core wire was placed. Balloon angioplasty was then performed of the pedal loop, deep perforating artery, dorsalis pedis lateral plantar artery, and the distal anterior tibial artery. 2 mm balloon angioplasty was first performed, and then 2.5 mm angioplasty in the distal anterior tibial artery. Angiogram performed demonstrated poor retrograde filling of the anterior tibial artery. In order to retrograde recanalized the anterior tibial artery, the 2 mm angioplasty balloon was positioned at the instep, transition of anterior tibial artery to dorsalis pedis, inflated, and then punctured directly under fluoroscopic visualization. With the needle within the angioplasty balloon, av 18 wire was threaded into the balloon, which was passed retrograde. Once the needle was removed and the wire had a few cm of purchase, the angioplasty balloon was removed. The 18 wire remain place within the dorsalis  pedis. Four French slender sheath was then placed into the distal anterior tibial artery. The V18 wire was removed, and retrograde crossing of the anterior tibial artery was performed with a standard Glidewire and 035 crossing catheter. With the crossing catheter positioned in the popliteal artery, the wire was removed, and gentle injection confirmed luminal position. Command wire was then placed retrograde into the superficial femoral artery. Balloon angioplasty was then performed of the length of the anterior tibial artery using 2 mm, 3 mm, 3.5 mm along the length. A series of balloon angioplasty was then performed using 2 mm, 2.5 mm, 3 mm angioplasty through the lateral plantar artery, pedal loop, deep perforating artery, dorsalis pedis, and anterior tibial artery. The balloon angioplasty was performed both through the anterior tibial artery and from the antegrade common femoral access. Repeat angiogram was demonstrating poor flow throughout  this series of balloon angioplasty. Ultimately, antegrade access was performed through the 6 French sheath at the common femoral artery into the anterior tibial artery origin. 014 wire was placed distally, through the pedal loop into the lateral plantar artery. Antegrade balloon angioplasty along the length of the anterior tibial artery, dosalis pedis, and pedal loop was performed. The origin of the anterior tibial artery was treated with a 4 mm x 80 mm drug-eluting balloon. Completion balloon angioplasty was performed again through the pedal loop. Final angiogram was performed. Reversal of heparin was performed and then Angio-Seal was deployed at the left common femoral artery access. A combination of manual pressure and internal balloon angioplasty across the puncture site at the ankle was performed for hemostasis at the foot. Patient tolerated the procedure well and remained hemodynamically stable throughout. No complications were encountered and no significant blood loss.  IMPRESSION: Status post left lower extremity angiogram, revascularization of occluded anterior tibial artery with angioplasty of anterior tibial artery, dorsalis pedis, deep perforating artery, lateral plantar artery, and reestablishing continuous flow through the pedal loop with 3 vessel tibial flow to the ankle. Status post deployment of Angio-Seal at the left common femoral artery Sig Yvone Neu. Loreta Ave, DO [facility] Radiology Electronically Signed   By: Gilmer Mor D.O.   On: 11/15/2017 09:19    Scheduled Meds: . aspirin EC  81 mg Oral Daily  . atorvastatin  80 mg Oral Daily  . citalopram  40 mg Oral Daily  . docusate sodium  100 mg Oral BID  . feeding supplement (ENSURE ENLIVE)  237 mL Oral BID BM  . gabapentin  300 mg Oral TID  . heparin  5,000 Units Subcutaneous Q8H  . insulin aspart  0-15 Units Subcutaneous TID WC & HS  . insulin glargine  50 Units Subcutaneous QHS  . multivitamin with minerals  1 tablet Oral Daily  . nicotine  21 mg Transdermal Daily  . polyethylene glycol  17 g Oral Daily  . sodium chloride flush  3 mL Intravenous Q12H    Continuous Infusions: . sodium chloride 10 mL/hr at 11/15/17 1010  .  ceFAZolin (ANCEF) IV 1 g (11/15/17 1444)  . methocarbamol (ROBAXIN)  IV       LOS: 5 days     Darlin Drop, MD [facility] Pager [phone]  If 7PM-7AM, please contact night-coverage [email or web address] Password [facility] 11/15/2017, 3:49 PM

## 2017-11-15 NOTE — Op Note (Signed)
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  SURGEON:  Nadara MustardMarcus V Sadeel Fiddler, MD  PHYSICIAN ASSISTANT:None ANESTHESIA:   General  PREOPERATIVE INDICATIONS:  Cody Pena is a  57 y.o. 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    The risks benefits and alternatives were discussed with the patient preoperatively including but not limited to the risks of infection, bleeding, nerve injury, cardiopulmonary complications, the need for revision surgery, among others, and the patient was willing to proceed.  OPERATIVE IMPLANTS: None.  @ENCIMAGES @  OPERATIVE FINDINGS: Abscess around the second metatarsal head.  Cultures sent x2.  OPERATIVE PROCEDURE: Patient was brought the operating room and underwent a general anesthetic.  After adequate levels of anesthesia were obtained patient's left lower extremity was prepped using DuraPrep draped into a sterile field a timeout was called.  A V incision was made around the ulcer and the second ray, the second metatarsal was resected through the base of the second metatarsal.  There was an abscess around the second metatarsal head and affected tissue was debrided.  Cultures were obtained x2.  The wound was irrigated with normal saline.  Local tissue rearrangement was used to close the wound that was 4 x 11 cm.  Incision closed using 2-0 nylon.  A sterile dressing was applied patient was extubated taken the PACU in stable condition.   DISCHARGE PLANNING:  Antibiotic duration: Continue IV antibiotics 24 hours postoperatively and then would discharge on 1 month of oral antibiotics either Septra DS or doxycycline.  Weightbearing: Nonweightbearing left lower extremity  Pain  medication: As needed  Dressing care/ Wound VAC: Keep dressing clean dry and intact until follow-up in 1 week.  Ambulatory devices: Walker or crutches.  Discharge to: Home.  Follow-up: In the office 1 week post operative.

## 2017-11-15 NOTE — Anesthesia Procedure Notes (Signed)
Procedure Name: LMA Insertion Date/Time: 11/15/2017 7:37 AM Performed by: Army FossaPulliam, Maudy Yonan Dane, CRNA Pre-anesthesia Checklist: Patient identified, Emergency Drugs available, Suction available and Patient being monitored Patient Re-evaluated:Patient Re-evaluated prior to induction Oxygen Delivery Method: Circle System Utilized Preoxygenation: Pre-oxygenation with 100% oxygen Induction Type: IV induction Ventilation: Mask ventilation without difficulty LMA: LMA inserted LMA Size: 5.0 Number of attempts: 1 Airway Equipment and Method: Bite block Placement Confirmation: positive ETCO2 Tube secured with: Tape Dental Injury: Teeth and Oropharynx as per pre-operative assessment

## 2017-11-15 NOTE — Interval H&P Note (Signed)
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 After consideration of risks, benefits and other options for treatment, the patient has consented to  Procedure(s): LEFT FOOT 2ND RAY AMPUTATION (Left) as a surgical intervention .  The patient's history has been reviewed, patient examined, no change in status, stable for surgery.  I have reviewed the patient's chart and labs.  Questions were answered to the patient's satisfaction.     Nadara MustardMarcus V Cherlynn Popiel

## 2017-11-16 ENCOUNTER — Encounter (HOSPITAL_COMMUNITY): Payer: Self-pay | Admitting: Orthopedic Surgery

## 2017-11-16 LAB — GLUCOSE, CAPILLARY
GLUCOSE-CAPILLARY: 151 mg/dL — AB (ref 65–99)
Glucose-Capillary: 207 mg/dL — ABNORMAL HIGH (ref 65–99)
Glucose-Capillary: 247 mg/dL — ABNORMAL HIGH (ref 65–99)

## 2017-11-16 MED ORDER — ENSURE ENLIVE PO LIQD: 237.0000 mL | Freq: Two times a day (BID) | ORAL | 0 refills | Status: DC

## 2017-11-16 MED ORDER — SULFAMETHOXAZOLE-TRIMETHOPRIM 400-80 MG PO TABS
1.0000 | ORAL_TABLET | Freq: Two times a day (BID) | ORAL | Status: DC
  Filled 2017-11-16: qty 1

## 2017-11-16 MED ORDER — SULFAMETHOXAZOLE-TRIMETHOPRIM 400-80 MG PO TABS: 1.0000 | ORAL_TABLET | Freq: Two times a day (BID) | ORAL | 0 refills | Status: AC

## 2017-11-16 MED ORDER — POLYETHYLENE GLYCOL 3350 17 G PO PACK: 17.0000 g | PACK | Freq: Every day | ORAL | 0 refills | Status: DC

## 2017-11-16 MED ORDER — NICOTINE 21 MG/24HR TD PT24: 21.0000 mg | MEDICATED_PATCH | Freq: Every day | TRANSDERMAL | 0 refills | Status: DC

## 2017-11-16 MED ORDER — OXYCODONE HCL 5 MG PO TABS: 5.0000 mg | ORAL_TABLET | Freq: Two times a day (BID) | ORAL | 0 refills | Status: DC

## 2017-11-16 MED ORDER — ADULT MULTIVITAMIN W/MINERALS CH: 1.0000 | ORAL_TABLET | Freq: Every day | ORAL | 0 refills | Status: DC

## 2017-11-16 NOTE — Care Management Note (Signed)
Case Management Note  Patient Details  Name: Cody Pena MRN: [id number] Date of Birth: 04/05/1961  Subjective/Objective:   57 yr old gentleman admitted with left foot wound. Underwent left foot second ray amputation on 11/15/17.                 Action/Plan: Case manager spoke with patient concerning discharge plan and DME. Choice for Home Health Agency was offered, patient politely declined. Bedside RN will order up crutches for patient.    Expected Discharge Date:    11/16/17              Expected Discharge Plan:  Home/Self Care  In-House Referral:  NA  Discharge planning Services  CM Consult  Post Acute Care Choice:  Durable Medical Equipment, Home Health Choice offered to:  Patient  DME Arranged:  Crutches DME Agency:  Other - Comment  HH Arranged:  Patient Refused HH HH Agency:  NA  Status of Service:  Completed, signed off  If discussed at Long Length of Stay Meetings, dates discussed:    Additional Comments:  Durenda GuthrieBrady, Emi Lymon Naomi, RN 11/16/2017, 11:14 AM

## 2017-11-16 NOTE — Progress Notes (Signed)
Orthopedic Tech Progress Note Patient Details:  Jaci CarrelRobert K Aldaco March 08, 1961 [id number]  Ortho Devices Type of Ortho Device: Crutches Ortho Device/Splint Location: left Ortho Device/Splint Interventions: Application   Post Interventions Patient Tolerated: Well, Ambulated well Instructions Provided: Care of device, Adjustment of device   Saul FordyceJennifer C Lace Chenevert 11/16/2017, 1:02 PM

## 2017-11-16 NOTE — Discharge Instructions (Signed)
Bone and Joint Infections, Adult Bone infections (osteomyelitis) and joint infections (septic arthritis) occur when bacteria or other germs get inside a bone or a joint. This can happen if you have an infection in another part of your body that spreads through your blood. Germs from your skin or from outside of your body can also cause this type of infection if you have a wound or a broken bone (fracture) that breaks the skin. Anyone can get a bone infection or joint infection. You may be more likely to get this type of infection if you have a condition, such as diabetes, that lowers your ability to fight infection or increases your chances of getting an infection. Bone and joint infections can cause damage, and they can spread to other areas of your body. They need to be treated quickly. What are the causes? Most bone and joint infections are caused by bacteria. They can also be caused by other germs, such as viruses and funguses. What increases the risk? This condition is more likely to develop in:  People who recently had surgery, especially bone or joint surgery.  People who have a long-term (chronic) disease, such as: ? HIV (human immunodeficiency virus). ? Diabetes. ? Rheumatoid arthritis. ? Sickle cell anemia.  Elderly people.  People who take medicines that block or weaken the bodys defense system (immune system).  People who have a condition that reduces their blood flow.  People who are on kidney dialysis.  People who have an artificial joint.  People who have had a joint or bone repaired with plates or screws (surgical hardware).  People who use or abuse IV drugs.  People who have had trauma, such as stepping on a nail.  What are the signs or symptoms? Symptoms vary depending on the type and location of your infection. Common symptoms of bone and joint infections include:  Fever and chills.  Redness and warmth.  Swelling.  Pain and stiffness.  Drainage of fluid  or pus near the infection.  Weight loss and fatigue.  Decreased ability to use a hand or foot.  How is this diagnosed? This condition may be diagnosed based on symptoms, medical history, a physical exam, and diagnostic tests. Tests can help to identify the cause of the infection. You may have various tests, such as:  A sample of tissue, fluid, or blood taken to be examined under a microscope.  A procedure to remove fluid from the infected joint with a needle (joint aspiration) for testing in a lab.  Pus or discharge swabbed from a wound for testing to identify germs and to determine what type of medicine will kill them (culture and sensitivity).  Blood tests to look for evidence of infection and inflammation (biomarkers).  Imaging studies to determine how severe the bone or joint infection is. These may include: ? X-rays. ? CT scan. ? MRI. ? Bone scan.  How is this treated? Treatment depends on the cause and type of infection. Antibiotic medicines are usually the first treatment for a bone or joint infection. Treatment with antibiotics may include:  Getting IV antibiotics. This may be done in a hospital at first. You may have to continue IV antibiotics at home for several weeks. You may also have to take antibiotics by mouth for several weeks after that.  Taking more than one kind of antibiotic. Treatment may start with a type of antibiotic that works against many different bacteria (broad spectrumantibiotics). IV antibiotics may be changed if tests show that another  type may work better.  Other treatments may include:  Draining fluid from the joint by placing a needle into it (aspiration).  Surgery to remove: ? Dead or dying tissue from a bone or joint. ? An infected artificial joint. ? Infected plates or screws that were used to repair a broken bone.  Follow these instructions at home:  Take medicines only as directed by your health care provider.  Take your antibiotic  medicine as directed by your health care provider. Finish the antibiotic even if you start to feel better.  Follow instructions from your health care provider about how to take IV antibiotics at home.  Ask your health care provider if you have any restrictions on your activities.  Keep all follow-up visits as directed by your health care provider. This is important. Contact a health care provider if:  You have a fever or chills.  You have redness, warmth, pain, or swelling that returns after treatment. Get help right away if:  You have rapid breathing or you have trouble breathing.  You have chest pain.  You cannot drink fluids or make urine.  The affected arm or leg swells, changes color, or turns blue. This information is not intended to replace advice given to you by your health care provider. Make sure you discuss any questions you have with your health care provider. Document Released: 07/30/2005 Document Revised: 01/05/2016 Document Reviewed: 07/28/2014 Elsevier Interactive Patient Education  2018 [employer].   Type 2 Diabetes Mellitus, Diagnosis, Adult Type 2 diabetes (type 2 diabetes mellitus) is a long-term (chronic) disease. It may be caused by one or both of these problems:  Your body does not make enough of a hormone called insulin.  Your body does not react in a normal way to insulin that it makes.  Insulin lets sugars (glucose) go into cells in the body. This gives you energy. If you have type 2 diabetes, sugars cannot get into cells. This causes high blood sugar (hyperglycemia). Your doctor will set treatment goals for you. Generally, you should have these blood sugar levels:  Before meals (preprandial): 80-130 mg/dL (1.6-1.0 mmol/L).  After meals (postprandial): below 180 mg/dL (10 mmol/L).  A1c (hemoglobin A1c) level: less than 7%.  Follow these instructions at home: Questions to Ask Your Doctor  You may want to ask these questions:  Do I need to  meet with a diabetes educator?  Where can I find a support group for people with diabetes?  What equipment will I need to care for myself at home?  What diabetes medicines do I need? When should I take them?  How often do I need to check my blood sugar?  What number can I call if I have questions?  When is my next doctor's visit?  General instructions  Take over-the-counter and prescription medicines only as told by your doctor.  Keep all follow-up visits as told by your doctor. This is important. Contact a doctor if:  Your blood sugar is at or above 240 mg/dL (96.0 mmol/L) for 2 days in a row.  You have been sick or have had a fever for 2 days or more and you are not getting better.  You have any of these problems for more than 6 hours: ? You cannot eat or drink. ? You feel sick to your stomach (nauseous). ? You throw up (vomit). ? You have watery poop (diarrhea). Get help right away if:  Your blood sugar is lower than 54 mg/dL (3 mmol/L).  You get confused.  You have trouble: ? Thinking clearly. ? Breathing.  You have moderate or large ketone levels in your pee (urine). This information is not intended to replace advice given to you by your health care provider. Make sure you discuss any questions you have with your health care provider. Document Released: 05/08/2008 Document Revised: 01/05/2016 Document Reviewed: 09/02/2015 Elsevier Interactive Patient Education  [employer]2018 [employer]

## 2017-11-16 NOTE — Discharge Summary (Signed)
Discharge Summary  Cody Pena UJW:[id number] DOB: 16-Jul-1961  PCP: Aida Puffer, MD  Admit date: 11/10/2017 Discharge date: 11/16/2017  Time spent: 25 minutes  Recommendations for Outpatient Follow-up:  1. Follow up with orthopedic surgery Dr. Lajoyce Corners in 1 week 2. Continue wound care at home 3. Continue p.o. antibiotics 4. Fall precautions   Ortho surgery recommendations per Dr Lajoyce Corners: DISCHARGE PLANNING:  Antibiotic duration: Continue IV antibiotics 24 hours postoperatively and then would discharge on 1 month of oral antibiotics either Septra DS or doxycycline.  Weightbearing: Nonweightbearing left lower extremity  Pain medication: As needed  Dressing care/ Wound VAC: Keep dressing clean dry and intact until follow-up in 1 week.  Ambulatory devices: Walker or crutches.  Discharge to: Home.  Follow-up: In the office 1 week post operative.    Discharge Diagnoses:  Active Hospital Problems   Diagnosis Date Noted  . Septic arthritis of foot (HCC) 11/11/2017  . Foot osteomyelitis, left (HCC)   . Osteomyelitis of ankle or foot, acute, left (HCC) 11/11/2017  . Type 2 DM with diabetic peripheral angiopathy w/o gangrene (HCC) 11/11/2017  . Cigarette smoker 11/11/2017  . Diabetic peripheral neuropathy (HCC) 11/11/2017  . Diabetic foot infection (HCC) 11/10/2017    Resolved Hospital Problems  No resolved problems to display.    Discharge Condition: Stable  Diet recommendation: Resume previous diet  Vitals:   11/16/17 0106 11/16/17 0604  BP: 103/63 108/73  Pulse: 69 66  Resp:    Temp: 98.5 F (36.9 C) (!) 97.5 F (36.4 C)  SpO2: 93% 96%    History of present illness:  57 year old man PMH diabetes mellitus with peripheral neuropathy, followed by wound clinic for left foot wound, sent to the emergency department after outpatient MRI revealed septic arthritis and osteomyelitis of the left foot.  11/13/2017: Patient seen and examined at his bedside.  He  states the pain on his left dorsal foot is well controlled on current pain medications.  He has no new complaints.  11/14/2017: Patient seen and examined at his bedside.  Has no new complaints at this time.  Orthopedic surgery planning left foot second ray amputation tomorrow   11/15/17: POD # 0 post left foot second ray amputation.  Patient seen and examined at his bedside.  States his pain is well controlled on current pain management.  Has no new complaints.  11/16/2017: POD #1 post left foot second ray amputation.  Patient seen and examined at his bedside.  States the pain is well controlled on current pain management.  On the day of discharge patient was hemodynamically stable.  He will need to follow-up with orthopedic surgery, PCP post hospitalization.   Hospital Course:  Principal Problem:   Septic arthritis of foot (HCC) Active Problems:   Diabetic foot infection (HCC)   Osteomyelitis of ankle or foot, acute, left (HCC)   Type 2 DM with diabetic peripheral angiopathy w/o gangrene (HCC)   Cigarette smoker   Diabetic peripheral neuropathy (HCC)   Foot osteomyelitis, left (HCC)  Left dorsal foot wound/osteomyelitis status post left foot second ray amputation POD #1 Orthopedic surgery following Dr. Lajoyce Corners Per orthopedic surgery continue IV antibiotic 24 hours post surgery. Discharge on either Septra DS or doxycycline p.o. times 4 weeks. No weightbearing left lower extremity Keep dressing clean dry and intact until follow-up in 1 week at [facility] Dan Humphreys or crutches at discharge Continue pain management Continue bowel regimen  Diabetes uncontrolled due to hyperglycemia Last A1c was 7.2 on 11/10/2017 Continue insulin  sliding scale Avoid hypoglycemia or hyperglycemia Continue home regimen Follow up with PCP  Peripheral vascular disease status post IR atherectomy with PTA and stent Continue statin and aspirin  Ambulatory dysfunction due to amputation PT evaluated and  recommended home health PT Continue PT Fall precautions  Hypertension Blood pressure stable  Tobacco use disorder Tobacco cessation counseling at bedside Continue nicotine patch    Consultations: Orthopedic surgery  Discharge Exam: BP 108/73 (BP Location: Right Arm)   Pulse 66   Temp (!) 97.5 F (36.4 C) (Oral)   Resp 16   Ht 6\' 3"  (1.905 m)   Wt 106.1 kg (233 lb 14.5 oz)   SpO2 96%   BMI 29.24 kg/m   General: 57 year old Caucasian male well-developed well-nourished in no acute distress.  Alert and oriented x3. Cardiovascular: Regular rate and rhythm with no rubs or gallops.  No JVD or thyromegaly present. Respiratory: Clear to auscultation with no wheezes or rales.  Discharge Instructions You were cared for by a hospitalist during your hospital stay. If you have any questions about your discharge medications or the care you received while you were in the hospital after you are discharged, you can call the unit and asked to speak with the hospitalist on call if the hospitalist that took care of you is not available. Once you are discharged, your primary care physician will handle any further medical issues. Please note that NO REFILLS for any discharge medications will be authorized once you are discharged, as it is imperative that you return to your primary care physician (or establish a relationship with a primary care physician if you do not have one) for your aftercare needs so that they can reassess your need for medications and monitor your lab values.   Allergies as of 11/16/2017   No Known Allergies     Medication List    TAKE these medications   atorvastatin 80 MG tablet Commonly known as:  LIPITOR Take 80 mg by mouth daily.   BASAGLAR KWIKPEN [place] Inject 80 Units into the skin at bedtime.   citalopram 40 MG tablet Commonly known as:  CELEXA Take 40 mg by mouth daily.   feeding supplement (ENSURE ENLIVE) Liqd Take 237 mLs by mouth 2 (two) times daily  between meals.   gabapentin 600 MG tablet Commonly known as:  NEURONTIN Take 600 mg by mouth daily.   HYDROcodone-acetaminophen 10-325 MG tablet Commonly known as:  NORCO Take 1 tablet by mouth every 4 (four) hours as needed for moderate pain.   irbesartan 150 MG tablet Commonly known as:  AVAPRO Take 150 mg by mouth daily.   multivitamin with minerals Tabs tablet Take 1 tablet by mouth daily. Start taking on:  11/17/2017   nicotine 21 mg/24hr patch Commonly known as:  NICODERM CQ - dosed in mg/24 hours Place 1 patch (21 mg total) onto the skin daily. Start taking on:  11/17/2017   oxyCODONE 5 MG immediate release tablet Commonly known as:  Oxy IR/ROXICODONE Take 1-2 tablets (5-10 mg total) by mouth 2 (two) times daily.   polyethylene glycol packet Commonly known as:  MIRALAX / GLYCOLAX Take 17 g by mouth daily. Start taking on:  11/17/2017   sulfamethoxazole-trimethoprim 400-80 MG tablet Commonly known as:  BACTRIM,SEPTRA Take 1 tablet by mouth every 12 (twelve) hours.   TRULICITY [place] Inject 1 Device into the skin every 7 (seven) days.   Vitamin D (Ergocalciferol) 50000 units Caps capsule Commonly known as:  DRISDOL Take 50,000 Units by  mouth every 7 (seven) days.      No Known Allergies Follow-up Information    Nadara Mustard, MD In 1 week.   Specialty:  Orthopedic Surgery Contact information: [address] [place] [address] [phone]        Aida Puffer, MD Follow up in 3 day(s).   Specialty:  Family Medicine Contact information: [address] [place] [address] [phone]            The results of significant diagnostics from this hospitalization (including imaging, microbiology, ancillary and laboratory) are listed below for reference.    Significant Diagnostic Studies: Ir Angiogram Extremity Left  Result Date: 11/15/2017 INDICATION: 57 year old male with a history of critical limb ischemia left foot. He presents for  revascularization of tibial vasculature for wound healing capability. EXAM: ULTRASOUND GUIDED ACCESS LEFT COMMON FEMORAL ARTERY IMAGE GUIDED ACCESS LEFT DORSALIS PEDIS BALLOON ANGIOPLASTY OF ANTERIOR TIBIAL ARTERY BALLOON ANGIOPLASTY DORSALIS PEDIS ARTERY BALLOON ANGIOPLASTY OF LATERAL PLANTAR ARTERY AND DEEP PERFORATING ARTERY TREATMENT OF PROXIMAL ANTERIOR TIBIAL ARTERY WITH DRUG-ELUTING BALLOON TECHNOLOGY DEPLOYMENT OF ANGIO-SEAL FOR HEMOSTASIS MEDICATIONS: 12000 units heparin, 40 mg protamine, 1000 mcg nitroglycerin, 6 mg intra arterial tPA ANESTHESIA/SEDATION: Moderate (conscious) sedation was employed during this procedure. A total of Versed 6 mg and Fentanyl 250 mcg was administered intravenously. Moderate Sedation Time: 195 minutes. The patient's level of consciousness and vital signs were monitored continuously by radiology nursing throughout the procedure under my direct supervision. CONTRAST:  180 cc Isovue FLUOROSCOPY TIME:  Fluoroscopy Time: 39 minutes 12 seconds (292 mGy) COMPLICATIONS: None PROCEDURE: Informed consent was obtained from the patient following explanation of the procedure, risks, benefits and alternatives. The patient understands, agrees and consents for the procedure. All questions were addressed. A time out was performed prior to the initiation of the procedure. Maximal barrier sterile technique utilized including caps, mask, sterile gowns, sterile gloves, large sterile drape, hand hygiene, and Betadine prep. Ultrasound survey of the left inguinal region was performed with images stored and sent to PACs. 1% lidocaine was used for local anesthesia. Small stab incision was made with 11 blade scalpel. Blunt dissection was performed, to greater tract for the anticipated closure device. A micropuncture needle was used access the left common femoral artery under ultrasound antegrade. With excellent arterial blood flow returned, and an .018 micro wire was passed through the needle, observed  enter the SFA under fluoroscopy. The needle was removed, and a micropuncture sheath was placed over the wire. Gentle contrast injection performed. The inner dilator and wire were removed, and an 035 Bentson wire was advanced under fluoroscopy into the SFA. The sheath was removed and a standard 5 [place] vascular sheath was placed. The dilator was removed and the sheath was flushed. Angiogram was performed of the left lower extremity. A 6 French 60 cm standard sheath was then placed over the Bentson wire. A combination of 035, 018, 014 crossing catheters were used to probe a small branch from the below knee popliteal artery/tibioperoneal artery as the possible origin of anterior tibial artery at the knee. Various wires were also used, including standard Glidewire for access, gold tip 018 glidewire, 014 abbott command wire. 016 fathom wire also used for accessing the collateral. After probing the small collateral, it was evident that this was only a collateral and there was likely a flush occlusion of the anterior tibial artery. 018 crossing catheter was then advanced into the posterior tibial artery for addressing a retrograde approach to the pedal  loop. 018 CX I crossing catheter and 014 command wire were used to address the distal occlusion of the deep perforating artery at the plantar loop. Using AP and lateral, the occlusion was navigated in a retrograde fashion through the dorsalis pedis and distal anterior tibial artery. Wire purchase was achieved with the 150 cm crossing catheter into the distal anterior tibial artery. Once the crossing catheter was hubbed, the command wire was removed and an 014 [place] core wire was placed. Balloon angioplasty was then performed of the pedal loop, deep perforating artery, dorsalis pedis lateral plantar artery, and the distal anterior tibial artery. 2 mm balloon angioplasty was first performed, and then 2.5 mm angioplasty in the distal anterior tibial artery. Angiogram  performed demonstrated poor retrograde filling of the anterior tibial artery. In order to retrograde recanalized the anterior tibial artery, the 2 mm angioplasty balloon was positioned at the instep, transition of anterior tibial artery to dorsalis pedis, inflated, and then punctured directly under fluoroscopic visualization. With the needle within the angioplasty balloon, av 18 wire was threaded into the balloon, which was passed retrograde. Once the needle was removed and the wire had a few cm of purchase, the angioplasty balloon was removed. The 18 wire remain place within the dorsalis pedis. Four French slender sheath was then placed into the distal anterior tibial artery. The V18 wire was removed, and retrograde crossing of the anterior tibial artery was performed with a standard Glidewire and 035 crossing catheter. With the crossing catheter positioned in the popliteal artery, the wire was removed, and gentle injection confirmed luminal position. Command wire was then placed retrograde into the superficial femoral artery. Balloon angioplasty was then performed of the length of the anterior tibial artery using 2 mm, 3 mm, 3.5 mm along the length. A series of balloon angioplasty was then performed using 2 mm, 2.5 mm, 3 mm angioplasty through the lateral plantar artery, pedal loop, deep perforating artery, dorsalis pedis, and anterior tibial artery. The balloon angioplasty was performed both through the anterior tibial artery and from the antegrade common femoral access. Repeat angiogram was demonstrating poor flow throughout this series of balloon angioplasty. Ultimately, antegrade access was performed through the 6 French sheath at the common femoral artery into the anterior tibial artery origin. 014 wire was placed distally, through the pedal loop into the lateral plantar artery. Antegrade balloon angioplasty along the length of the anterior tibial artery, dosalis pedis, and pedal loop was performed. The  origin of the anterior tibial artery was treated with a 4 mm x 80 mm drug-eluting balloon. Completion balloon angioplasty was performed again through the pedal loop. Final angiogram was performed. Reversal of heparin was performed and then Angio-Seal was deployed at the left common femoral artery access. A combination of manual pressure and internal balloon angioplasty across the puncture site at the ankle was performed for hemostasis at the foot. Patient tolerated the procedure well and remained hemodynamically stable throughout. No complications were encountered and no significant blood loss. IMPRESSION: Status post left lower extremity angiogram, revascularization of occluded anterior tibial artery with angioplasty of anterior tibial artery, dorsalis pedis, deep perforating artery, lateral plantar artery, and reestablishing continuous flow through the pedal loop with 3 vessel tibial flow to the ankle. Status post deployment of Angio-Seal at the left common femoral artery Sig Yvone Neu. Loreta Ave, DO [facility] Electronically Signed   By: Gilmer Mor D.O.   On: 11/15/2017 09:19   Ir Angiogram Selective Each Additional  Vessel  Result Date: 11/15/2017 INDICATION: 57 year old male with a history of critical limb ischemia left foot. He presents for revascularization of tibial vasculature for wound healing capability. EXAM: ULTRASOUND GUIDED ACCESS LEFT COMMON FEMORAL ARTERY IMAGE GUIDED ACCESS LEFT DORSALIS PEDIS BALLOON ANGIOPLASTY OF ANTERIOR TIBIAL ARTERY BALLOON ANGIOPLASTY DORSALIS PEDIS ARTERY BALLOON ANGIOPLASTY OF LATERAL PLANTAR ARTERY AND DEEP PERFORATING ARTERY TREATMENT OF PROXIMAL ANTERIOR TIBIAL ARTERY WITH DRUG-ELUTING BALLOON TECHNOLOGY DEPLOYMENT OF ANGIO-SEAL FOR HEMOSTASIS MEDICATIONS: 12000 units heparin, 40 mg protamine, 1000 mcg nitroglycerin, 6 mg intra arterial tPA ANESTHESIA/SEDATION: Moderate (conscious) sedation was employed during  this procedure. A total of Versed 6 mg and Fentanyl 250 mcg was administered intravenously. Moderate Sedation Time: 195 minutes. The patient's level of consciousness and vital signs were monitored continuously by radiology nursing throughout the procedure under my direct supervision. CONTRAST:  180 cc Isovue FLUOROSCOPY TIME:  Fluoroscopy Time: 39 minutes 12 seconds (292 mGy) COMPLICATIONS: None PROCEDURE: Informed consent was obtained from the patient following explanation of the procedure, risks, benefits and alternatives. The patient understands, agrees and consents for the procedure. All questions were addressed. A time out was performed prior to the initiation of the procedure. Maximal barrier sterile technique utilized including caps, mask, sterile gowns, sterile gloves, large sterile drape, hand hygiene, and Betadine prep. Ultrasound survey of the left inguinal region was performed with images stored and sent to PACs. 1% lidocaine was used for local anesthesia. Small stab incision was made with 11 blade scalpel. Blunt dissection was performed, to greater tract for the anticipated closure device. A micropuncture needle was used access the left common femoral artery under ultrasound antegrade. With excellent arterial blood flow returned, and an .018 micro wire was passed through the needle, observed enter the SFA under fluoroscopy. The needle was removed, and a micropuncture sheath was placed over the wire. Gentle contrast injection performed. The inner dilator and wire were removed, and an 035 Bentson wire was advanced under fluoroscopy into the SFA. The sheath was removed and a standard 5 [place] vascular sheath was placed. The dilator was removed and the sheath was flushed. Angiogram was performed of the left lower extremity. A 6 French 60 cm standard sheath was then placed over the Bentson wire. A combination of 035, 018, 014 crossing catheters were used to probe a small branch from the below knee popliteal  artery/tibioperoneal artery as the possible origin of anterior tibial artery at the knee. Various wires were also used, including standard Glidewire for access, gold tip 018 glidewire, 014 abbott command wire. 016 fathom wire also used for accessing the collateral. After probing the small collateral, it was evident that this was only a collateral and there was likely a flush occlusion of the anterior tibial artery. 018 crossing catheter was then advanced into the posterior tibial artery for addressing a retrograde approach to the pedal loop. 018 CX I crossing catheter and 014 command wire were used to address the distal occlusion of the deep perforating artery at the plantar loop. Using AP and lateral, the occlusion was navigated in a retrograde fashion through the dorsalis pedis and distal anterior tibial artery. Wire purchase was achieved with the 150 cm crossing catheter into the distal anterior tibial artery. Once the crossing catheter was hubbed, the command wire was removed and an 014 [place] core wire was placed. Balloon angioplasty was then performed of the pedal loop, deep perforating artery, dorsalis pedis lateral plantar artery, and the distal anterior tibial artery. 2 mm balloon angioplasty was first performed,  and then 2.5 mm angioplasty in the distal anterior tibial artery. Angiogram performed demonstrated poor retrograde filling of the anterior tibial artery. In order to retrograde recanalized the anterior tibial artery, the 2 mm angioplasty balloon was positioned at the instep, transition of anterior tibial artery to dorsalis pedis, inflated, and then punctured directly under fluoroscopic visualization. With the needle within the angioplasty balloon, av 18 wire was threaded into the balloon, which was passed retrograde. Once the needle was removed and the wire had a few cm of purchase, the angioplasty balloon was removed. The 18 wire remain place within the dorsalis pedis. Four French slender sheath  was then placed into the distal anterior tibial artery. The V18 wire was removed, and retrograde crossing of the anterior tibial artery was performed with a standard Glidewire and 035 crossing catheter. With the crossing catheter positioned in the popliteal artery, the wire was removed, and gentle injection confirmed luminal position. Command wire was then placed retrograde into the superficial femoral artery. Balloon angioplasty was then performed of the length of the anterior tibial artery using 2 mm, 3 mm, 3.5 mm along the length. A series of balloon angioplasty was then performed using 2 mm, 2.5 mm, 3 mm angioplasty through the lateral plantar artery, pedal loop, deep perforating artery, dorsalis pedis, and anterior tibial artery. The balloon angioplasty was performed both through the anterior tibial artery and from the antegrade common femoral access. Repeat angiogram was demonstrating poor flow throughout this series of balloon angioplasty. Ultimately, antegrade access was performed through the 6 French sheath at the common femoral artery into the anterior tibial artery origin. 014 wire was placed distally, through the pedal loop into the lateral plantar artery. Antegrade balloon angioplasty along the length of the anterior tibial artery, dosalis pedis, and pedal loop was performed. The origin of the anterior tibial artery was treated with a 4 mm x 80 mm drug-eluting balloon. Completion balloon angioplasty was performed again through the pedal loop. Final angiogram was performed. Reversal of heparin was performed and then Angio-Seal was deployed at the left common femoral artery access. A combination of manual pressure and internal balloon angioplasty across the puncture site at the ankle was performed for hemostasis at the foot. Patient tolerated the procedure well and remained hemodynamically stable throughout. No complications were encountered and no significant blood loss. IMPRESSION: Status post left  lower extremity angiogram, revascularization of occluded anterior tibial artery with angioplasty of anterior tibial artery, dorsalis pedis, deep perforating artery, lateral plantar artery, and reestablishing continuous flow through the pedal loop with 3 vessel tibial flow to the ankle. Status post deployment of Angio-Seal at the left common femoral artery Sig Yvone Neu. Loreta Ave, DO [facility] Electronically Signed   By: Gilmer Mor D.O.   On: 11/15/2017 09:19   Ir Angiogram Selective Each Additional Vessel  Result Date: 11/15/2017 INDICATION: 57 year old male with a history of critical limb ischemia left foot. He presents for revascularization of tibial vasculature for wound healing capability. EXAM: ULTRASOUND GUIDED ACCESS LEFT COMMON FEMORAL ARTERY IMAGE GUIDED ACCESS LEFT DORSALIS PEDIS BALLOON ANGIOPLASTY OF ANTERIOR TIBIAL ARTERY BALLOON ANGIOPLASTY DORSALIS PEDIS ARTERY BALLOON ANGIOPLASTY OF LATERAL PLANTAR ARTERY AND DEEP PERFORATING ARTERY TREATMENT OF PROXIMAL ANTERIOR TIBIAL ARTERY WITH DRUG-ELUTING BALLOON TECHNOLOGY DEPLOYMENT OF ANGIO-SEAL FOR HEMOSTASIS MEDICATIONS: 12000 units heparin, 40 mg protamine, 1000 mcg nitroglycerin, 6 mg intra arterial tPA ANESTHESIA/SEDATION: Moderate (conscious) sedation was employed during this procedure. A total of Versed 6 mg and Fentanyl 250 mcg  was administered intravenously. Moderate Sedation Time: 195 minutes. The patient's level of consciousness and vital signs were monitored continuously by radiology nursing throughout the procedure under my direct supervision. CONTRAST:  180 cc Isovue FLUOROSCOPY TIME:  Fluoroscopy Time: 39 minutes 12 seconds (292 mGy) COMPLICATIONS: None PROCEDURE: Informed consent was obtained from the patient following explanation of the procedure, risks, benefits and alternatives. The patient understands, agrees and consents for the procedure. All questions were addressed. A time  out was performed prior to the initiation of the procedure. Maximal barrier sterile technique utilized including caps, mask, sterile gowns, sterile gloves, large sterile drape, hand hygiene, and Betadine prep. Ultrasound survey of the left inguinal region was performed with images stored and sent to PACs. 1% lidocaine was used for local anesthesia. Small stab incision was made with 11 blade scalpel. Blunt dissection was performed, to greater tract for the anticipated closure device. A micropuncture needle was used access the left common femoral artery under ultrasound antegrade. With excellent arterial blood flow returned, and an .018 micro wire was passed through the needle, observed enter the SFA under fluoroscopy. The needle was removed, and a micropuncture sheath was placed over the wire. Gentle contrast injection performed. The inner dilator and wire were removed, and an 035 Bentson wire was advanced under fluoroscopy into the SFA. The sheath was removed and a standard 5 [place] vascular sheath was placed. The dilator was removed and the sheath was flushed. Angiogram was performed of the left lower extremity. A 6 French 60 cm standard sheath was then placed over the Bentson wire. A combination of 035, 018, 014 crossing catheters were used to probe a small branch from the below knee popliteal artery/tibioperoneal artery as the possible origin of anterior tibial artery at the knee. Various wires were also used, including standard Glidewire for access, gold tip 018 glidewire, 014 abbott command wire. 016 fathom wire also used for accessing the collateral. After probing the small collateral, it was evident that this was only a collateral and there was likely a flush occlusion of the anterior tibial artery. 018 crossing catheter was then advanced into the posterior tibial artery for addressing a retrograde approach to the pedal loop. 018 CX I crossing catheter and 014 command wire were used to address the distal  occlusion of the deep perforating artery at the plantar loop. Using AP and lateral, the occlusion was navigated in a retrograde fashion through the dorsalis pedis and distal anterior tibial artery. Wire purchase was achieved with the 150 cm crossing catheter into the distal anterior tibial artery. Once the crossing catheter was hubbed, the command wire was removed and an 014 [place] core wire was placed. Balloon angioplasty was then performed of the pedal loop, deep perforating artery, dorsalis pedis lateral plantar artery, and the distal anterior tibial artery. 2 mm balloon angioplasty was first performed, and then 2.5 mm angioplasty in the distal anterior tibial artery. Angiogram performed demonstrated poor retrograde filling of the anterior tibial artery. In order to retrograde recanalized the anterior tibial artery, the 2 mm angioplasty balloon was positioned at the instep, transition of anterior tibial artery to dorsalis pedis, inflated, and then punctured directly under fluoroscopic visualization. With the needle within the angioplasty balloon, av 18 wire was threaded into the balloon, which was passed retrograde. Once the needle was removed and the wire had a few cm of purchase, the angioplasty balloon was removed. The 18 wire remain place within the dorsalis pedis. Four French slender sheath was then placed into  the distal anterior tibial artery. The V18 wire was removed, and retrograde crossing of the anterior tibial artery was performed with a standard Glidewire and 035 crossing catheter. With the crossing catheter positioned in the popliteal artery, the wire was removed, and gentle injection confirmed luminal position. Command wire was then placed retrograde into the superficial femoral artery. Balloon angioplasty was then performed of the length of the anterior tibial artery using 2 mm, 3 mm, 3.5 mm along the length. A series of balloon angioplasty was then performed using 2 mm, 2.5 mm, 3 mm angioplasty  through the lateral plantar artery, pedal loop, deep perforating artery, dorsalis pedis, and anterior tibial artery. The balloon angioplasty was performed both through the anterior tibial artery and from the antegrade common femoral access. Repeat angiogram was demonstrating poor flow throughout this series of balloon angioplasty. Ultimately, antegrade access was performed through the 6 French sheath at the common femoral artery into the anterior tibial artery origin. 014 wire was placed distally, through the pedal loop into the lateral plantar artery. Antegrade balloon angioplasty along the length of the anterior tibial artery, dosalis pedis, and pedal loop was performed. The origin of the anterior tibial artery was treated with a 4 mm x 80 mm drug-eluting balloon. Completion balloon angioplasty was performed again through the pedal loop. Final angiogram was performed. Reversal of heparin was performed and then Angio-Seal was deployed at the left common femoral artery access. A combination of manual pressure and internal balloon angioplasty across the puncture site at the ankle was performed for hemostasis at the foot. Patient tolerated the procedure well and remained hemodynamically stable throughout. No complications were encountered and no significant blood loss. IMPRESSION: Status post left lower extremity angiogram, revascularization of occluded anterior tibial artery with angioplasty of anterior tibial artery, dorsalis pedis, deep perforating artery, lateral plantar artery, and reestablishing continuous flow through the pedal loop with 3 vessel tibial flow to the ankle. Status post deployment of Angio-Seal at the left common femoral artery Sig Yvone Neu. Loreta Ave, DO [facility] Radiology Electronically Signed   By: Gilmer Mor D.O.   On: 11/15/2017 09:19   Ir Tib-pero Art Pta Mod Sed  Result Date: 11/15/2017 INDICATION: 57 year old male with a history of  critical limb ischemia left foot. He presents for revascularization of tibial vasculature for wound healing capability. EXAM: ULTRASOUND GUIDED ACCESS LEFT COMMON FEMORAL ARTERY IMAGE GUIDED ACCESS LEFT DORSALIS PEDIS BALLOON ANGIOPLASTY OF ANTERIOR TIBIAL ARTERY BALLOON ANGIOPLASTY DORSALIS PEDIS ARTERY BALLOON ANGIOPLASTY OF LATERAL PLANTAR ARTERY AND DEEP PERFORATING ARTERY TREATMENT OF PROXIMAL ANTERIOR TIBIAL ARTERY WITH DRUG-ELUTING BALLOON TECHNOLOGY DEPLOYMENT OF ANGIO-SEAL FOR HEMOSTASIS MEDICATIONS: 12000 units heparin, 40 mg protamine, 1000 mcg nitroglycerin, 6 mg intra arterial tPA ANESTHESIA/SEDATION: Moderate (conscious) sedation was employed during this procedure. A total of Versed 6 mg and Fentanyl 250 mcg was administered intravenously. Moderate Sedation Time: 195 minutes. The patient's level of consciousness and vital signs were monitored continuously by radiology nursing throughout the procedure under my direct supervision. CONTRAST:  180 cc Isovue FLUOROSCOPY TIME:  Fluoroscopy Time: 39 minutes 12 seconds (292 mGy) COMPLICATIONS: None PROCEDURE: Informed consent was obtained from the patient following explanation of the procedure, risks, benefits and alternatives. The patient understands, agrees and consents for the procedure. All questions were addressed. A time out was performed prior to the initiation of the procedure. Maximal barrier sterile technique utilized including caps, mask, sterile gowns, sterile gloves, large sterile drape, hand hygiene, and Betadine prep. Ultrasound survey of  the left inguinal region was performed with images stored and sent to PACs. 1% lidocaine was used for local anesthesia. Small stab incision was made with 11 blade scalpel. Blunt dissection was performed, to greater tract for the anticipated closure device. A micropuncture needle was used access the left common femoral artery under ultrasound antegrade. With excellent arterial blood flow returned, and an .018  micro wire was passed through the needle, observed enter the SFA under fluoroscopy. The needle was removed, and a micropuncture sheath was placed over the wire. Gentle contrast injection performed. The inner dilator and wire were removed, and an 035 Bentson wire was advanced under fluoroscopy into the SFA. The sheath was removed and a standard 5 [place] vascular sheath was placed. The dilator was removed and the sheath was flushed. Angiogram was performed of the left lower extremity. A 6 French 60 cm standard sheath was then placed over the Bentson wire. A combination of 035, 018, 014 crossing catheters were used to probe a small branch from the below knee popliteal artery/tibioperoneal artery as the possible origin of anterior tibial artery at the knee. Various wires were also used, including standard Glidewire for access, gold tip 018 glidewire, 014 abbott command wire. 016 fathom wire also used for accessing the collateral. After probing the small collateral, it was evident that this was only a collateral and there was likely a flush occlusion of the anterior tibial artery. 018 crossing catheter was then advanced into the posterior tibial artery for addressing a retrograde approach to the pedal loop. 018 CX I crossing catheter and 014 command wire were used to address the distal occlusion of the deep perforating artery at the plantar loop. Using AP and lateral, the occlusion was navigated in a retrograde fashion through the dorsalis pedis and distal anterior tibial artery. Wire purchase was achieved with the 150 cm crossing catheter into the distal anterior tibial artery. Once the crossing catheter was hubbed, the command wire was removed and an 014 [place] core wire was placed. Balloon angioplasty was then performed of the pedal loop, deep perforating artery, dorsalis pedis lateral plantar artery, and the distal anterior tibial artery. 2 mm balloon angioplasty was first performed, and then 2.5 mm angioplasty in  the distal anterior tibial artery. Angiogram performed demonstrated poor retrograde filling of the anterior tibial artery. In order to retrograde recanalized the anterior tibial artery, the 2 mm angioplasty balloon was positioned at the instep, transition of anterior tibial artery to dorsalis pedis, inflated, and then punctured directly under fluoroscopic visualization. With the needle within the angioplasty balloon, av 18 wire was threaded into the balloon, which was passed retrograde. Once the needle was removed and the wire had a few cm of purchase, the angioplasty balloon was removed. The 18 wire remain place within the dorsalis pedis. Four French slender sheath was then placed into the distal anterior tibial artery. The V18 wire was removed, and retrograde crossing of the anterior tibial artery was performed with a standard Glidewire and 035 crossing catheter. With the crossing catheter positioned in the popliteal artery, the wire was removed, and gentle injection confirmed luminal position. Command wire was then placed retrograde into the superficial femoral artery. Balloon angioplasty was then performed of the length of the anterior tibial artery using 2 mm, 3 mm, 3.5 mm along the length. A series of balloon angioplasty was then performed using 2 mm, 2.5 mm, 3 mm angioplasty through the lateral plantar artery, pedal loop, deep perforating artery, dorsalis pedis, and anterior tibial  artery. The balloon angioplasty was performed both through the anterior tibial artery and from the antegrade common femoral access. Repeat angiogram was demonstrating poor flow throughout this series of balloon angioplasty. Ultimately, antegrade access was performed through the 6 French sheath at the common femoral artery into the anterior tibial artery origin. 014 wire was placed distally, through the pedal loop into the lateral plantar artery. Antegrade balloon angioplasty along the length of the anterior tibial artery, dosalis  pedis, and pedal loop was performed. The origin of the anterior tibial artery was treated with a 4 mm x 80 mm drug-eluting balloon. Completion balloon angioplasty was performed again through the pedal loop. Final angiogram was performed. Reversal of heparin was performed and then Angio-Seal was deployed at the left common femoral artery access. A combination of manual pressure and internal balloon angioplasty across the puncture site at the ankle was performed for hemostasis at the foot. Patient tolerated the procedure well and remained hemodynamically stable throughout. No complications were encountered and no significant blood loss. IMPRESSION: Status post left lower extremity angiogram, revascularization of occluded anterior tibial artery with angioplasty of anterior tibial artery, dorsalis pedis, deep perforating artery, lateral plantar artery, and reestablishing continuous flow through the pedal loop with 3 vessel tibial flow to the ankle. Status post deployment of Angio-Seal at the left common femoral artery Sig Yvone Neu. Loreta Ave, DO [facility] Radiology Specialists [facility] Radiology Electronically Signed   By: Gilmer Mor D.O.   On: 11/15/2017 09:19   Ir Tib-pero Art Uni Pta Ea Add Vessel Mod Sed  Result Date: 11/15/2017 INDICATION: 57 year old male with a history of critical limb ischemia left foot. He presents for revascularization of tibial vasculature for wound healing capability. EXAM: ULTRASOUND GUIDED ACCESS LEFT COMMON FEMORAL ARTERY IMAGE GUIDED ACCESS LEFT DORSALIS PEDIS BALLOON ANGIOPLASTY OF ANTERIOR TIBIAL ARTERY BALLOON ANGIOPLASTY DORSALIS PEDIS ARTERY BALLOON ANGIOPLASTY OF LATERAL PLANTAR ARTERY AND DEEP PERFORATING ARTERY TREATMENT OF PROXIMAL ANTERIOR TIBIAL ARTERY WITH DRUG-ELUTING BALLOON TECHNOLOGY DEPLOYMENT OF ANGIO-SEAL FOR HEMOSTASIS MEDICATIONS: 12000 units heparin, 40 mg protamine, 1000 mcg nitroglycerin, 6 mg intra arterial tPA ANESTHESIA/SEDATION: Moderate  (conscious) sedation was employed during this procedure. A total of Versed 6 mg and Fentanyl 250 mcg was administered intravenously. Moderate Sedation Time: 195 minutes. The patient's level of consciousness and vital signs were monitored continuously by radiology nursing throughout the procedure under my direct supervision. CONTRAST:  180 cc Isovue FLUOROSCOPY TIME:  Fluoroscopy Time: 39 minutes 12 seconds (292 mGy) COMPLICATIONS: None PROCEDURE: Informed consent was obtained from the patient following explanation of the procedure, risks, benefits and alternatives. The patient understands, agrees and consents for the procedure. All questions were addressed. A time out was performed prior to the initiation of the procedure. Maximal barrier sterile technique utilized including caps, mask, sterile gowns, sterile gloves, large sterile drape, hand hygiene, and Betadine prep. Ultrasound survey of the left inguinal region was performed with images stored and sent to PACs. 1% lidocaine was used for local anesthesia. Small stab incision was made with 11 blade scalpel. Blunt dissection was performed, to greater tract for the anticipated closure device. A micropuncture needle was used access the left common femoral artery under ultrasound antegrade. With excellent arterial blood flow returned, and an .018 micro wire was passed through the needle, observed enter the SFA under fluoroscopy. The needle was removed, and a micropuncture sheath was placed over the wire. Gentle contrast injection performed. The inner dilator and wire were removed, and an 035 Bentson wire was advanced  under fluoroscopy into the SFA. The sheath was removed and a standard 5 [place] vascular sheath was placed. The dilator was removed and the sheath was flushed. Angiogram was performed of the left lower extremity. A 6 French 60 cm standard sheath was then placed over the Bentson wire. A combination of 035, 018, 014 crossing catheters were used to probe a  small branch from the below knee popliteal artery/tibioperoneal artery as the possible origin of anterior tibial artery at the knee. Various wires were also used, including standard Glidewire for access, gold tip 018 glidewire, 014 abbott command wire. 016 fathom wire also used for accessing the collateral. After probing the small collateral, it was evident that this was only a collateral and there was likely a flush occlusion of the anterior tibial artery. 018 crossing catheter was then advanced into the posterior tibial artery for addressing a retrograde approach to the pedal loop. 018 CX I crossing catheter and 014 command wire were used to address the distal occlusion of the deep perforating artery at the plantar loop. Using AP and lateral, the occlusion was navigated in a retrograde fashion through the dorsalis pedis and distal anterior tibial artery. Wire purchase was achieved with the 150 cm crossing catheter into the distal anterior tibial artery. Once the crossing catheter was hubbed, the command wire was removed and an 014 [place] core wire was placed. Balloon angioplasty was then performed of the pedal loop, deep perforating artery, dorsalis pedis lateral plantar artery, and the distal anterior tibial artery. 2 mm balloon angioplasty was first performed, and then 2.5 mm angioplasty in the distal anterior tibial artery. Angiogram performed demonstrated poor retrograde filling of the anterior tibial artery. In order to retrograde recanalized the anterior tibial artery, the 2 mm angioplasty balloon was positioned at the instep, transition of anterior tibial artery to dorsalis pedis, inflated, and then punctured directly under fluoroscopic visualization. With the needle within the angioplasty balloon, av 18 wire was threaded into the balloon, which was passed retrograde. Once the needle was removed and the wire had a few cm of purchase, the angioplasty balloon was removed. The 18 wire remain place within the  dorsalis pedis. Four French slender sheath was then placed into the distal anterior tibial artery. The V18 wire was removed, and retrograde crossing of the anterior tibial artery was performed with a standard Glidewire and 035 crossing catheter. With the crossing catheter positioned in the popliteal artery, the wire was removed, and gentle injection confirmed luminal position. Command wire was then placed retrograde into the superficial femoral artery. Balloon angioplasty was then performed of the length of the anterior tibial artery using 2 mm, 3 mm, 3.5 mm along the length. A series of balloon angioplasty was then performed using 2 mm, 2.5 mm, 3 mm angioplasty through the lateral plantar artery, pedal loop, deep perforating artery, dorsalis pedis, and anterior tibial artery. The balloon angioplasty was performed both through the anterior tibial artery and from the antegrade common femoral access. Repeat angiogram was demonstrating poor flow throughout this series of balloon angioplasty. Ultimately, antegrade access was performed through the 6 French sheath at the common femoral artery into the anterior tibial artery origin. 014 wire was placed distally, through the pedal loop into the lateral plantar artery. Antegrade balloon angioplasty along the length of the anterior tibial artery, dosalis pedis, and pedal loop was performed. The origin of the anterior tibial artery was treated with a 4 mm x 80 mm drug-eluting balloon. Completion balloon angioplasty was performed again  through the pedal loop. Final angiogram was performed. Reversal of heparin was performed and then Angio-Seal was deployed at the left common femoral artery access. A combination of manual pressure and internal balloon angioplasty across the puncture site at the ankle was performed for hemostasis at the foot. Patient tolerated the procedure well and remained hemodynamically stable throughout. No complications were encountered and no significant  blood loss. IMPRESSION: Status post left lower extremity angiogram, revascularization of occluded anterior tibial artery with angioplasty of anterior tibial artery, dorsalis pedis, deep perforating artery, lateral plantar artery, and reestablishing continuous flow through the pedal loop with 3 vessel tibial flow to the ankle. Status post deployment of Angio-Seal at the left common femoral artery Sig Yvone Neu. Loreta Ave, DO [facility] Inter[facility] Radiology Specialists [facility] Radiology Electronically Signed   By: Gilmer Mor D.O.   On: 11/15/2017 09:19   Ir US Guide Vasc Access Left  Result Date: 11/15/2017 INDICATION: 57 year old male with a history of critical limb ischemia left foot. He presents for revascularization of tibial vasculature for wound healing capability. EXAM: ULTRASOUND GUIDED ACCESS LEFT COMMON FEMORAL ARTERY IMAGE GUIDED ACCESS LEFT DORSALIS PEDIS BALLOON ANGIOPLASTY OF ANTERIOR TIBIAL ARTERY BALLOON ANGIOPLASTY DORSALIS PEDIS ARTERY BALLOON ANGIOPLASTY OF LATERAL PLANTAR ARTERY AND DEEP PERFORATING ARTERY TREATMENT OF PROXIMAL ANTERIOR TIBIAL ARTERY WITH DRUG-ELUTING BALLOON TECHNOLOGY DEPLOYMENT OF ANGIO-SEAL FOR HEMOSTASIS MEDICATIONS: 12000 units heparin, 40 mg protamine, 1000 mcg nitroglycerin, 6 mg intra arterial tPA ANESTHESIA/SEDATION: Moderate (conscious) sedation was employed during this procedure. A total of Versed 6 mg and Fentanyl 250 mcg was administered intravenously. Moderate Sedation Time: 195 minutes. The patient's level of consciousness and vital signs were monitored continuously by radiology nursing throughout the procedure under my direct supervision. CONTRAST:  180 cc Isovue FLUOROSCOPY TIME:  Fluoroscopy Time: 39 minutes 12 seconds (292 mGy) COMPLICATIONS: None PROCEDURE: Informed consent was obtained from the patient following explanation of the procedure, risks, benefits and alternatives. The patient understands, agrees and consents for the procedure. All  questions were addressed. A time out was performed prior to the initiation of the procedure. Maximal barrier sterile technique utilized including caps, mask, sterile gowns, sterile gloves, large sterile drape, hand hygiene, and Betadine prep. Ultrasound survey of the left inguinal region was performed with images stored and sent to PACs. 1% lidocaine was used for local anesthesia. Small stab incision was made with 11 blade scalpel. Blunt dissection was performed, to greater tract for the anticipated closure device. A micropuncture needle was used access the left common femoral artery under ultrasound antegrade. With excellent arterial blood flow returned, and an .018 micro wire was passed through the needle, observed enter the SFA under fluoroscopy. The needle was removed, and a micropuncture sheath was placed over the wire. Gentle contrast injection performed. The inner dilator and wire were removed, and an 035 Bentson wire was advanced under fluoroscopy into the SFA. The sheath was removed and a standard 5 [place] vascular sheath was placed. The dilator was removed and the sheath was flushed. Angiogram was performed of the left lower extremity. A 6 French 60 cm standard sheath was then placed over the Bentson wire. A combination of 035, 018, 014 crossing catheters were used to probe a small branch from the below knee popliteal artery/tibioperoneal artery as the possible origin of anterior tibial artery at the knee. Various wires were also used, including standard Glidewire for access, gold tip 018 glidewire, 014 abbott command wire. 016 fathom wire also used for accessing the collateral. After probing the  small collateral, it was evident that this was only a collateral and there was likely a flush occlusion of the anterior tibial artery. 018 crossing catheter was then advanced into the posterior tibial artery for addressing a retrograde approach to the pedal loop. 018 CX I crossing catheter and 014 command wire  were used to address the distal occlusion of the deep perforating artery at the plantar loop. Using AP and lateral, the occlusion was navigated in a retrograde fashion through the dorsalis pedis and distal anterior tibial artery. Wire purchase was achieved with the 150 cm crossing catheter into the distal anterior tibial artery. Once the crossing catheter was hubbed, the command wire was removed and an 014 [place] core wire was placed. Balloon angioplasty was then performed of the pedal loop, deep perforating artery, dorsalis pedis lateral plantar artery, and the distal anterior tibial artery. 2 mm balloon angioplasty was first performed, and then 2.5 mm angioplasty in the distal anterior tibial artery. Angiogram performed demonstrated poor retrograde filling of the anterior tibial artery. In order to retrograde recanalized the anterior tibial artery, the 2 mm angioplasty balloon was positioned at the instep, transition of anterior tibial artery to dorsalis pedis, inflated, and then punctured directly under fluoroscopic visualization. With the needle within the angioplasty balloon, av 18 wire was threaded into the balloon, which was passed retrograde. Once the needle was removed and the wire had a few cm of purchase, the angioplasty balloon was removed. The 18 wire remain place within the dorsalis pedis. Four French slender sheath was then placed into the distal anterior tibial artery. The V18 wire was removed, and retrograde crossing of the anterior tibial artery was performed with a standard Glidewire and 035 crossing catheter. With the crossing catheter positioned in the popliteal artery, the wire was removed, and gentle injection confirmed luminal position. Command wire was then placed retrograde into the superficial femoral artery. Balloon angioplasty was then performed of the length of the anterior tibial artery using 2 mm, 3 mm, 3.5 mm along the length. A series of balloon angioplasty was then performed using  2 mm, 2.5 mm, 3 mm angioplasty through the lateral plantar artery, pedal loop, deep perforating artery, dorsalis pedis, and anterior tibial artery. The balloon angioplasty was performed both through the anterior tibial artery and from the antegrade common femoral access. Repeat angiogram was demonstrating poor flow throughout this series of balloon angioplasty. Ultimately, antegrade access was performed through the 6 French sheath at the common femoral artery into the anterior tibial artery origin. 014 wire was placed distally, through the pedal loop into the lateral plantar artery. Antegrade balloon angioplasty along the length of the anterior tibial artery, dosalis pedis, and pedal loop was performed. The origin of the anterior tibial artery was treated with a 4 mm x 80 mm drug-eluting balloon. Completion balloon angioplasty was performed again through the pedal loop. Final angiogram was performed. Reversal of heparin was performed and then Angio-Seal was deployed at the left common femoral artery access. A combination of manual pressure and internal balloon angioplasty across the puncture site at the ankle was performed for hemostasis at the foot. Patient tolerated the procedure well and remained hemodynamically stable throughout. No complications were encountered and no significant blood loss. IMPRESSION: Status post left lower extremity angiogram, revascularization of occluded anterior tibial artery with angioplasty of anterior tibial artery, dorsalis pedis, deep perforating artery, lateral plantar artery, and reestablishing continuous flow through the pedal loop with 3 vessel tibial flow to the ankle. Status post  deployment of Angio-Seal at the left common femoral artery Sig Yvone Neu. Loreta Ave, DO [facility] Radiology Electronically Signed   By: Gilmer Mor D.O.   On: 11/15/2017 09:19   Dg Foot Complete Left  Result Date: 11/10/2017 CLINICAL DATA:  Diabetic  left foot infection. EXAM: LEFT FOOT - COMPLETE 3+ VIEW COMPARISON:  05/22/2017 left foot radiographs FINDINGS: Plantar distal left foot soft tissue ulcer at the level of second and third MTP joints. Cortical erosions are present at proximal aspects of the proximal phalanges in the second and third toes. There is a fracture subluxation of the base of the proximal phalanx in the left second toe. No additional cortical erosions. No periosteal reaction. No additional fractures. No additional dislocation. Mild degenerative change at the first tarsometatarsal joint. No radiopaque foreign body. Soft tissue swelling throughout the mid to distal left foot. IMPRESSION: Plantar distal left foot soft tissue ulcer at the level of the second and third MTP joints. Evidence of acute osteomyelitis involving proximal aspects of the proximal phalanges of the second and third toes with cortical erosions. Fracture-subluxation of the base of the proximal phalanx in the left second toe. Electronically Signed   By: Delbert Phenix M.D.   On: 11/10/2017 20:47    Microbiology: Recent Results (from the past 240 hour(s))  Culture, blood (Routine x 2)     Status: None   Collection Time: 11/08/17  6:50 PM  Result Value Ref Range Status   Specimen Description BLOOD RIGHT ANTECUBITAL  Final   Special Requests   Final    BOTTLES DRAWN AEROBIC AND ANAEROBIC Blood Culture adequate volume   Culture   Final    NO GROWTH 5 DAYS Performed at [facility], [address] [place], [place] [address]    Report Status 11/13/2017 FINAL  Final  Culture, blood (Routine x 2)     Status: None   Collection Time: 11/10/17  6:15 PM  Result Value Ref Range Status   Specimen Description BLOOD RIGHT ANTECUBITAL  Final   Special Requests   Final    BOTTLES DRAWN AEROBIC AND ANAEROBIC Blood Culture adequate volume   Culture   Final    NO GROWTH 5 DAYS Performed at [facility], [address], [place], [place] [address]    Report  Status 11/15/2017 FINAL  Final  Culture, blood (Routine x 2)     Status: None   Collection Time: 11/10/17  7:50 PM  Result Value Ref Range Status   Specimen Description BLOOD LEFT ANTECUBITAL  Final   Special Requests IN PEDIATRIC BOTTLE Blood Culture adequate volume  Final   Culture   Final    NO GROWTH 5 DAYS Performed at [facility], [address], [place], [place] [address]    Report Status 11/15/2017 FINAL  Final  Wound or Superficial Culture     Status: None   Collection Time: 11/10/17  8:41 PM  Result Value Ref Range Status   Specimen Description WOUND LEFT FOOT  Final   Special Requests NONE  Final   Gram Stain   Final    MODERATE WBC PRESENT, PREDOMINANTLY PMN RARE GRAM POSITIVE COCCI    Culture   Final    NORMAL SKIN FLORA Performed at [facility], [address], [place], [place] [address]    Report Status 11/14/2017 FINAL  Final  Aerobic/Anaerobic Culture (surgical/deep wound)     Status: None (Preliminary result)  Collection Time: 11/15/17  7:48 AM  Result Value Ref Range Status   Specimen Description TISSUE  Final   Special Requests LEFT FOOT AMPUTATION  Final   Gram Stain   Final    FEW WBC PRESENT, PREDOMINANTLY PMN RARE GRAM POSITIVE COCCI RARE GRAM POSITIVE RODS    Culture   Final    CULTURE REINCUBATED FOR BETTER GROWTH Performed at [facility], [address] [place], [place] [address]    Report Status PENDING  Incomplete     Labs: Basic Metabolic Panel: Recent Labs  Lab 11/10/17 1815 11/11/17 0457 11/14/17 0936  NA 137 137 135  K 4.3 3.9 4.3  CL 99* 101 97*  CO2 28 26 29   GLUCOSE 118* 108* 121*  BUN 9 8 11   CREATININE 0.88 0.85 0.92  CALCIUM 9.1 8.6* 8.6*   Liver Function Tests: Recent Labs  Lab 11/10/17 1815 11/11/17 0457 11/14/17 0936  AST 16 15 15   ALT 21 18 17   ALKPHOS 78 74 71  BILITOT 0.4 0.4 0.7  PROT 7.5 6.1* 6.3*  ALBUMIN 3.4* 2.9* 2.9*   No results for input(s): LIPASE, AMYLASE in the last  168 hours. No results for input(s): AMMONIA in the last 168 hours. CBC: Recent Labs  Lab 11/10/17 1815 11/11/17 0457 11/14/17 0936  WBC 13.7* 11.2* 9.6  NEUTROABS 8.9*  --   --   HGB 15.1 13.5 12.8*  HCT 44.8 40.5 38.8*  MCV 93.9 94.2 93.7  PLT 310 273 272   Cardiac Enzymes: No results for input(s): CKTOTAL, CKMB, CKMBINDEX, TROPONINI in the last 168 hours. BNP: BNP (last 3 results) No results for input(s): BNP in the last 8760 hours.  ProBNP (last 3 results) No results for input(s): PROBNP in the last 8760 hours.  CBG: Recent Labs  Lab 11/15/17 1233 11/15/17 1652 11/15/17 2138 11/16/17 0809 11/16/17 1125  GLUCAP 257* 287* 284* 151* 247*       Signed:  Darlin Drop, MD [facility] 11/16/2017, 1:18 PM

## 2017-11-16 NOTE — Progress Notes (Signed)
Subjective: 1 Day Post-Op Procedure(s) (LRB): LEFT FOOT 2ND RAY AMPUTATION (Left) Patient reports pain as mild.  Doing ok this am.  Main complaint is saturated bandage.  Objective: Vital signs in last 24 hours: Temp:  [97 F (36.1 C)-98.7 F (37.1 C)] 97.5 F (36.4 C) (04/06 0604) Pulse Rate:  [66-92] 66 (04/06 0604) Resp:  [11-16] 16 (04/05 2142) BP: (103-119)/(58-74) 108/73 (04/06 0604) SpO2:  [88 %-98 %] 96 % (04/06 0604)  Intake/Output from previous day: 04/05 0701 - 04/06 0700 In: 787 [P.O.:360; I.V.:377; IV Piggyback:50] Out: 1360 [Urine:1350; Blood:10] Intake/Output this shift: No intake/output data recorded.  Recent Labs    11/14/17 0936  HGB 12.8*   Recent Labs    11/14/17 0936  WBC 9.6  RBC 4.14*  HCT 38.8*  PLT 272   Recent Labs    11/14/17 0936  NA 135  K 4.3  CL 97*  CO2 29  BUN 11  CREATININE 0.92  GLUCOSE 121*  CALCIUM 8.6*   No results for input(s): LABPT, INR in the last 72 hours.  Neurologically intact Neurovascular intact Sensation intact distally Intact pulses distally Dorsiflexion/Plantar flexion intact Incision: moderate drainage No cellulitis present Compartment soft  Dressing changed by me today    Assessment/Plan: 1 Day Post-Op Procedure(s) (LRB): LEFT FOOT 2ND RAY AMPUTATION (Left) Advance diet  NWB LLE/post-op shoe Dry dressing change prn Ok for d/c home from ortho standpoint    Cristie HemMary L Kinsey Karch 11/16/2017, 8:01 AM

## 2017-11-16 NOTE — Progress Notes (Signed)
Physical Therapy Treatment Patient Details Name: Cody Pena MRN: [id number] DOB: 02-25-1961 Today's Date: 11/16/2017    History of Present Illness 57yo male presenting with a L foot wound, referred to the ED by wound center due to concerns for osteomyelitis. 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 PMH DM, HTN, peripheral neuropathy, anxiety/depression, active smoker     PT Comments    Patient reported feeling more comfortable with crutches. He was able to go up and down the stairs. He did have 1 lob but was able to self correct. He would benefit from further crutch training with home health. Actute therapy will continue to work with him while he is in the hospital.   Follow Up Recommendations  Home health PT     Equipment Recommendations  Crutches;Rolling walker with 5" wheels    Recommendations for Other Services       Precautions / Restrictions Precautions Precautions: None Restrictions Weight Bearing Restrictions: Yes LLE Weight Bearing: Non weight bearing    Mobility  Bed Mobility Overal bed mobility: Modified Independent                Transfers Overall transfer level: Needs assistance Equipment used: Crutches Transfers: Sit to/from Stand Sit to Stand: Min guard         General transfer comment: moderate vcs's for transfer with cru5tches. After practicing a few times he had no difficulty transfering.   Ambulation/Gait Ambulation/Gait assistance: Min guard Ambulation Distance (Feet): 40 Feet Assistive device: Crutches Gait Pattern/deviations: Step-to pattern   Gait velocity interpretation: Below normal speed for age/gender General Gait Details: Steady with crutches with practice. Cuing to slow it down and shorten his stride.    Stairs Stairs: Yes   Stair Management: One rail Right Number of Stairs: 10 General stair comments: using cvrutches the patient was able to hgo up and down the stairs with one rail and min guard. He improved  his technique with practicce. He did have 1 LOB but he waqs able to self correct. He will have poeople to assist him when he gets home.   Wheelchair Mobility    Modified Rankin (Stroke Patients Only)       Balance Overall balance assessment: Needs assistance Sitting-balance support: Bilateral upper extremity supported Sitting balance-[name]: Good     Standing balance support: Bilateral upper extremity supported Standing balance-[name]: Poor                              Cognition Arousal/Alertness: Awake/alert Behavior During Therapy: WFL for tasks assessed/performed Overall Cognitive Status: Within Functional Limits for tasks assessed                                        Exercises      General Comments        Pertinent Vitals/Pain Pain Assessment: No/denies pain(mild pain from time to time )    Home Living                      Prior Function            PT Goals (current goals can now be found in the care plan section) Acute Rehab PT Goals Patient Stated Goal: to go home  PT Goal Formulation: With patient Time For Goal Achievement: 11/29/17 Potential to Achieve Goals: Good  Frequency    Min 4X/week      PT Plan      Co-evaluation              AM-PAC PT "6 Clicks" Daily Activity  Outcome Measure  Difficulty turning over in bed (including adjusting bedclothes, sheets and blankets)?: None Difficulty moving from lying on back to sitting on the side of the bed? : None Difficulty sitting down on and standing up from a chair with arms (e.g., wheelchair, bedside commode, etc,.)?: None Help needed moving to and from a bed to chair (including a wheelchair)?: A Little Help needed walking in hospital room?: A Little Help needed climbing 3-5 steps with a railing? : A Lot 6 Click Score: 20    End of Session Equipment Utilized During Treatment: Gait belt Activity Tolerance: Patient tolerated treatment  well Patient left: in bed;with call bell/phone within reach;with SCD's reapplied Nurse Communication: Mobility status PT Visit Diagnosis: Other abnormalities of gait and mobility (R26.89);Difficulty in walking, not elsewhere classified (R26.2);Muscle weakness (generalized) (M62.81)     Time: [id number] PT Time Calculation (min) (ACUTE ONLY): 30 min  Charges:  $Gait Training: 8-22 mins $Therapeutic Activity: 8-22 mins                    G Codes:         Dessie Comaavid J Aletha Allebach PT DPT  11/16/2017, 9:39 AM

## 2017-11-16 NOTE — Progress Notes (Signed)
RN gave pt discharge instructions as well as removed his IV pt comfortable awaiting ride

## 2017-11-16 NOTE — Progress Notes (Signed)
Referring Physician(s): Early,T  Supervising Physician: Malachy Moan  Patient Status:  [facility] - In-pt  Chief Complaint:  Left foot pain/peripheral vascular disease  Subjective: Patient sitting up in chair, states that left foot still hurts but pain level has decreased; denies paresthesias.  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  Medications: Prior to Admission medications   Medication Sig Start Date End Date Taking? Authorizing Provider  citalopram (CELEXA) 40 MG tablet Take 40 mg by mouth daily.   Yes [provider]  Dulaglutide (TRULICITY [place]) Inject 1 Device into the skin every 7 (seven) days.    Yes [provider]  gabapentin (NEURONTIN) 600 MG tablet Take 600 mg by mouth daily.    Yes [provider]  HYDROcodone-acetaminophen (NORCO) 10-325 MG per tablet Take 1 tablet by mouth every 4 (four) hours as needed for moderate pain.    Yes [provider]  Insulin Glargine (BASAGLAR KWIKPEN [place]) Inject 80 Units into the skin at bedtime.    Yes [provider]  irbesartan (AVAPRO) 150 MG tablet Take 150 mg by mouth daily.   Yes [provider]  Vitamin D, Ergocalciferol, (DRISDOL) 50000 units CAPS capsule Take 50,000 Units by mouth every 7 (seven) days.   Yes [provider]  atorvastatin (LIPITOR) 80 MG tablet Take 80 mg by mouth daily.    [provider]     Vital Signs: BP 108/73 (BP Location: Right Arm)   Pulse 66   Temp (!) 97.5 F (36.4 C) (Oral)   Resp 16   Ht 6\' 3"  (1.905 m)   Wt 233 lb 14.5 oz (106.1 kg)   SpO2 96%   BMI 29.24 kg/m   Physical Exam awake, alert.  Left common femoral artery puncture site soft, clean, dry, nontender, no hematoma.  Left foot bandaged and in boot.  Motor/ sensory function normal; 1+ distal pulses  Imaging: Ir Angiogram Extremity Left  Result Date: 11/15/2017 INDICATION: 57 year old male with a  history of critical limb ischemia left foot. He presents for revascularization of tibial vasculature for wound healing capability. EXAM: ULTRASOUND GUIDED ACCESS LEFT COMMON FEMORAL ARTERY IMAGE GUIDED ACCESS LEFT DORSALIS PEDIS BALLOON ANGIOPLASTY OF ANTERIOR TIBIAL ARTERY BALLOON ANGIOPLASTY DORSALIS PEDIS ARTERY BALLOON ANGIOPLASTY OF LATERAL PLANTAR ARTERY AND DEEP PERFORATING ARTERY TREATMENT OF PROXIMAL ANTERIOR TIBIAL ARTERY WITH DRUG-ELUTING BALLOON TECHNOLOGY DEPLOYMENT OF ANGIO-SEAL FOR HEMOSTASIS MEDICATIONS: 12000 units heparin, 40 mg protamine, 1000 mcg nitroglycerin, 6 mg intra arterial tPA ANESTHESIA/SEDATION: Moderate (conscious) sedation was employed during this procedure. A total of Versed 6 mg and Fentanyl 250 mcg was administered intravenously. Moderate Sedation Time: 195 minutes. The patient's level of consciousness and vital signs were monitored continuously by radiology nursing throughout the procedure under my direct supervision. CONTRAST:  180 cc Isovue FLUOROSCOPY TIME:  Fluoroscopy Time: 39 minutes 12 seconds (292 mGy) COMPLICATIONS: None PROCEDURE: Informed consent was obtained from the patient following explanation of the procedure, risks, benefits and alternatives. The patient understands, agrees and consents for the procedure. All questions were addressed. A time out was performed prior to the initiation of the procedure. Maximal barrier sterile technique utilized including caps, mask, sterile gowns, sterile gloves, large sterile drape, hand hygiene, and Betadine prep. Ultrasound survey of the left inguinal region was performed with images stored and sent to PACs. 1% lidocaine was used for local anesthesia. Small stab incision was made with 11 blade scalpel. Blunt dissection was performed, to greater  tract for the anticipated closure device. A micropuncture needle was used access the left common femoral artery under ultrasound antegrade. With excellent arterial blood flow returned, and  an .018 micro wire was passed through the needle, observed enter the SFA under fluoroscopy. The needle was removed, and a micropuncture sheath was placed over the wire. Gentle contrast injection performed. The inner dilator and wire were removed, and an 035 Bentson wire was advanced under fluoroscopy into the SFA. The sheath was removed and a standard 5 [place] vascular sheath was placed. The dilator was removed and the sheath was flushed. Angiogram was performed of the left lower extremity. A 6 French 60 cm standard sheath was then placed over the Bentson wire. A combination of 035, 018, 014 crossing catheters were used to probe a small branch from the below knee popliteal artery/tibioperoneal artery as the possible origin of anterior tibial artery at the knee. Various wires were also used, including standard Glidewire for access, gold tip 018 glidewire, 014 abbott command wire. 016 fathom wire also used for accessing the collateral. After probing the small collateral, it was evident that this was only a collateral and there was likely a flush occlusion of the anterior tibial artery. 018 crossing catheter was then advanced into the posterior tibial artery for addressing a retrograde approach to the pedal loop. 018 CX I crossing catheter and 014 command wire were used to address the distal occlusion of the deep perforating artery at the plantar loop. Using AP and lateral, the occlusion was navigated in a retrograde fashion through the dorsalis pedis and distal anterior tibial artery. Wire purchase was achieved with the 150 cm crossing catheter into the distal anterior tibial artery. Once the crossing catheter was hubbed, the command wire was removed and an 014 [place] core wire was placed. Balloon angioplasty was then performed of the pedal loop, deep perforating artery, dorsalis pedis lateral plantar artery, and the distal anterior tibial artery. 2 mm balloon angioplasty was first performed, and then 2.5 mm  angioplasty in the distal anterior tibial artery. Angiogram performed demonstrated poor retrograde filling of the anterior tibial artery. In order to retrograde recanalized the anterior tibial artery, the 2 mm angioplasty balloon was positioned at the instep, transition of anterior tibial artery to dorsalis pedis, inflated, and then punctured directly under fluoroscopic visualization. With the needle within the angioplasty balloon, av 18 wire was threaded into the balloon, which was passed retrograde. Once the needle was removed and the wire had a few cm of purchase, the angioplasty balloon was removed. The 18 wire remain place within the dorsalis pedis. Four French slender sheath was then placed into the distal anterior tibial artery. The V18 wire was removed, and retrograde crossing of the anterior tibial artery was performed with a standard Glidewire and 035 crossing catheter. With the crossing catheter positioned in the popliteal artery, the wire was removed, and gentle injection confirmed luminal position. Command wire was then placed retrograde into the superficial femoral artery. Balloon angioplasty was then performed of the length of the anterior tibial artery using 2 mm, 3 mm, 3.5 mm along the length. A series of balloon angioplasty was then performed using 2 mm, 2.5 mm, 3 mm angioplasty through the lateral plantar artery, pedal loop, deep perforating artery, dorsalis pedis, and anterior tibial artery. The balloon angioplasty was performed both through the anterior tibial artery and from the antegrade common femoral access. Repeat angiogram was demonstrating poor flow throughout this series of balloon angioplasty. Ultimately, antegrade access was  performed through the 6 French sheath at the common femoral artery into the anterior tibial artery origin. 014 wire was placed distally, through the pedal loop into the lateral plantar artery. Antegrade balloon angioplasty along the length of the anterior tibial  artery, dosalis pedis, and pedal loop was performed. The origin of the anterior tibial artery was treated with a 4 mm x 80 mm drug-eluting balloon. Completion balloon angioplasty was performed again through the pedal loop. Final angiogram was performed. Reversal of heparin was performed and then Angio-Seal was deployed at the left common femoral artery access. A combination of manual pressure and internal balloon angioplasty across the puncture site at the ankle was performed for hemostasis at the foot. Patient tolerated the procedure well and remained hemodynamically stable throughout. No complications were encountered and no significant blood loss. IMPRESSION: Status post left lower extremity angiogram, revascularization of occluded anterior tibial artery with angioplasty of anterior tibial artery, dorsalis pedis, deep perforating artery, lateral plantar artery, and reestablishing continuous flow through the pedal loop with 3 vessel tibial flow to the ankle. Status post deployment of Angio-Seal at the left common femoral artery Sig Yvone Neu. Loreta Ave, DO [facility] Radiology Electronically Signed   By: Gilmer Mor D.O.   On: 11/15/2017 09:19   Ir Angiogram Selective Each Additional Vessel  Result Date: 11/15/2017 INDICATION: 57 year old male with a history of critical limb ischemia left foot. He presents for revascularization of tibial vasculature for wound healing capability. EXAM: ULTRASOUND GUIDED ACCESS LEFT COMMON FEMORAL ARTERY IMAGE GUIDED ACCESS LEFT DORSALIS PEDIS BALLOON ANGIOPLASTY OF ANTERIOR TIBIAL ARTERY BALLOON ANGIOPLASTY DORSALIS PEDIS ARTERY BALLOON ANGIOPLASTY OF LATERAL PLANTAR ARTERY AND DEEP PERFORATING ARTERY TREATMENT OF PROXIMAL ANTERIOR TIBIAL ARTERY WITH DRUG-ELUTING BALLOON TECHNOLOGY DEPLOYMENT OF ANGIO-SEAL FOR HEMOSTASIS MEDICATIONS: 12000 units heparin, 40 mg protamine, 1000 mcg nitroglycerin, 6 mg intra arterial tPA  ANESTHESIA/SEDATION: Moderate (conscious) sedation was employed during this procedure. A total of Versed 6 mg and Fentanyl 250 mcg was administered intravenously. Moderate Sedation Time: 195 minutes. The patient's level of consciousness and vital signs were monitored continuously by radiology nursing throughout the procedure under my direct supervision. CONTRAST:  180 cc Isovue FLUOROSCOPY TIME:  Fluoroscopy Time: 39 minutes 12 seconds (292 mGy) COMPLICATIONS: None PROCEDURE: Informed consent was obtained from the patient following explanation of the procedure, risks, benefits and alternatives. The patient understands, agrees and consents for the procedure. All questions were addressed. A time out was performed prior to the initiation of the procedure. Maximal barrier sterile technique utilized including caps, mask, sterile gowns, sterile gloves, large sterile drape, hand hygiene, and Betadine prep. Ultrasound survey of the left inguinal region was performed with images stored and sent to PACs. 1% lidocaine was used for local anesthesia. Small stab incision was made with 11 blade scalpel. Blunt dissection was performed, to greater tract for the anticipated closure device. A micropuncture needle was used access the left common femoral artery under ultrasound antegrade. With excellent arterial blood flow returned, and an .018 micro wire was passed through the needle, observed enter the SFA under fluoroscopy. The needle was removed, and a micropuncture sheath was placed over the wire. Gentle contrast injection performed. The inner dilator and wire were removed, and an 035 Bentson wire was advanced under fluoroscopy into the SFA. The sheath was removed and a standard 5 [place] vascular sheath was placed. The dilator was removed and the sheath was flushed. Angiogram was performed of the left lower extremity. A 6 French 60  cm standard sheath was then placed over the Bentson wire. A combination of 035, 018, 014 crossing  catheters were used to probe a small branch from the below knee popliteal artery/tibioperoneal artery as the possible origin of anterior tibial artery at the knee. Various wires were also used, including standard Glidewire for access, gold tip 018 glidewire, 014 abbott command wire. 016 fathom wire also used for accessing the collateral. After probing the small collateral, it was evident that this was only a collateral and there was likely a flush occlusion of the anterior tibial artery. 018 crossing catheter was then advanced into the posterior tibial artery for addressing a retrograde approach to the pedal loop. 018 CX I crossing catheter and 014 command wire were used to address the distal occlusion of the deep perforating artery at the plantar loop. Using AP and lateral, the occlusion was navigated in a retrograde fashion through the dorsalis pedis and distal anterior tibial artery. Wire purchase was achieved with the 150 cm crossing catheter into the distal anterior tibial artery. Once the crossing catheter was hubbed, the command wire was removed and an 014 [place] core wire was placed. Balloon angioplasty was then performed of the pedal loop, deep perforating artery, dorsalis pedis lateral plantar artery, and the distal anterior tibial artery. 2 mm balloon angioplasty was first performed, and then 2.5 mm angioplasty in the distal anterior tibial artery. Angiogram performed demonstrated poor retrograde filling of the anterior tibial artery. In order to retrograde recanalized the anterior tibial artery, the 2 mm angioplasty balloon was positioned at the instep, transition of anterior tibial artery to dorsalis pedis, inflated, and then punctured directly under fluoroscopic visualization. With the needle within the angioplasty balloon, av 18 wire was threaded into the balloon, which was passed retrograde. Once the needle was removed and the wire had a few cm of purchase, the angioplasty balloon was removed. The 18  wire remain place within the dorsalis pedis. Four French slender sheath was then placed into the distal anterior tibial artery. The V18 wire was removed, and retrograde crossing of the anterior tibial artery was performed with a standard Glidewire and 035 crossing catheter. With the crossing catheter positioned in the popliteal artery, the wire was removed, and gentle injection confirmed luminal position. Command wire was then placed retrograde into the superficial femoral artery. Balloon angioplasty was then performed of the length of the anterior tibial artery using 2 mm, 3 mm, 3.5 mm along the length. A series of balloon angioplasty was then performed using 2 mm, 2.5 mm, 3 mm angioplasty through the lateral plantar artery, pedal loop, deep perforating artery, dorsalis pedis, and anterior tibial artery. The balloon angioplasty was performed both through the anterior tibial artery and from the antegrade common femoral access. Repeat angiogram was demonstrating poor flow throughout this series of balloon angioplasty. Ultimately, antegrade access was performed through the 6 French sheath at the common femoral artery into the anterior tibial artery origin. 014 wire was placed distally, through the pedal loop into the lateral plantar artery. Antegrade balloon angioplasty along the length of the anterior tibial artery, dosalis pedis, and pedal loop was performed. The origin of the anterior tibial artery was treated with a 4 mm x 80 mm drug-eluting balloon. Completion balloon angioplasty was performed again through the pedal loop. Final angiogram was performed. Reversal of heparin was performed and then Angio-Seal was deployed at the left common femoral artery access. A combination of manual pressure and internal balloon angioplasty across the puncture site at  the ankle was performed for hemostasis at the foot. Patient tolerated the procedure well and remained hemodynamically stable throughout. No complications were  encountered and no significant blood loss. IMPRESSION: Status post left lower extremity angiogram, revascularization of occluded anterior tibial artery with angioplasty of anterior tibial artery, dorsalis pedis, deep perforating artery, lateral plantar artery, and reestablishing continuous flow through the pedal loop with 3 vessel tibial flow to the ankle. Status post deployment of Angio-Seal at the left common femoral artery Sig Yvone Neu. Loreta Ave, DO [facility] Radiology Electronically Signed   By: Gilmer Mor D.O.   On: 11/15/2017 09:19   Ir Angiogram Selective Each Additional Vessel  Result Date: 11/15/2017 INDICATION: 57 year old male with a history of critical limb ischemia left foot. He presents for revascularization of tibial vasculature for wound healing capability. EXAM: ULTRASOUND GUIDED ACCESS LEFT COMMON FEMORAL ARTERY IMAGE GUIDED ACCESS LEFT DORSALIS PEDIS BALLOON ANGIOPLASTY OF ANTERIOR TIBIAL ARTERY BALLOON ANGIOPLASTY DORSALIS PEDIS ARTERY BALLOON ANGIOPLASTY OF LATERAL PLANTAR ARTERY AND DEEP PERFORATING ARTERY TREATMENT OF PROXIMAL ANTERIOR TIBIAL ARTERY WITH DRUG-ELUTING BALLOON TECHNOLOGY DEPLOYMENT OF ANGIO-SEAL FOR HEMOSTASIS MEDICATIONS: 12000 units heparin, 40 mg protamine, 1000 mcg nitroglycerin, 6 mg intra arterial tPA ANESTHESIA/SEDATION: Moderate (conscious) sedation was employed during this procedure. A total of Versed 6 mg and Fentanyl 250 mcg was administered intravenously. Moderate Sedation Time: 195 minutes. The patient's level of consciousness and vital signs were monitored continuously by radiology nursing throughout the procedure under my direct supervision. CONTRAST:  180 cc Isovue FLUOROSCOPY TIME:  Fluoroscopy Time: 39 minutes 12 seconds (292 mGy) COMPLICATIONS: None PROCEDURE: Informed consent was obtained from the patient following explanation of the procedure, risks, benefits and alternatives. The patient understands,  agrees and consents for the procedure. All questions were addressed. A time out was performed prior to the initiation of the procedure. Maximal barrier sterile technique utilized including caps, mask, sterile gowns, sterile gloves, large sterile drape, hand hygiene, and Betadine prep. Ultrasound survey of the left inguinal region was performed with images stored and sent to PACs. 1% lidocaine was used for local anesthesia. Small stab incision was made with 11 blade scalpel. Blunt dissection was performed, to greater tract for the anticipated closure device. A micropuncture needle was used access the left common femoral artery under ultrasound antegrade. With excellent arterial blood flow returned, and an .018 micro wire was passed through the needle, observed enter the SFA under fluoroscopy. The needle was removed, and a micropuncture sheath was placed over the wire. Gentle contrast injection performed. The inner dilator and wire were removed, and an 035 Bentson wire was advanced under fluoroscopy into the SFA. The sheath was removed and a standard 5 [place] vascular sheath was placed. The dilator was removed and the sheath was flushed. Angiogram was performed of the left lower extremity. A 6 French 60 cm standard sheath was then placed over the Bentson wire. A combination of 035, 018, 014 crossing catheters were used to probe a small branch from the below knee popliteal artery/tibioperoneal artery as the possible origin of anterior tibial artery at the knee. Various wires were also used, including standard Glidewire for access, gold tip 018 glidewire, 014 abbott command wire. 016 fathom wire also used for accessing the collateral. After probing the small collateral, it was evident that this was only a collateral and there was likely a flush occlusion of the anterior tibial artery. 018 crossing catheter was then advanced into the posterior tibial artery for addressing a retrograde approach  to the pedal loop. 018 CX I  crossing catheter and 014 command wire were used to address the distal occlusion of the deep perforating artery at the plantar loop. Using AP and lateral, the occlusion was navigated in a retrograde fashion through the dorsalis pedis and distal anterior tibial artery. Wire purchase was achieved with the 150 cm crossing catheter into the distal anterior tibial artery. Once the crossing catheter was hubbed, the command wire was removed and an 014 [place] core wire was placed. Balloon angioplasty was then performed of the pedal loop, deep perforating artery, dorsalis pedis lateral plantar artery, and the distal anterior tibial artery. 2 mm balloon angioplasty was first performed, and then 2.5 mm angioplasty in the distal anterior tibial artery. Angiogram performed demonstrated poor retrograde filling of the anterior tibial artery. In order to retrograde recanalized the anterior tibial artery, the 2 mm angioplasty balloon was positioned at the instep, transition of anterior tibial artery to dorsalis pedis, inflated, and then punctured directly under fluoroscopic visualization. With the needle within the angioplasty balloon, av 18 wire was threaded into the balloon, which was passed retrograde. Once the needle was removed and the wire had a few cm of purchase, the angioplasty balloon was removed. The 18 wire remain place within the dorsalis pedis. Four French slender sheath was then placed into the distal anterior tibial artery. The V18 wire was removed, and retrograde crossing of the anterior tibial artery was performed with a standard Glidewire and 035 crossing catheter. With the crossing catheter positioned in the popliteal artery, the wire was removed, and gentle injection confirmed luminal position. Command wire was then placed retrograde into the superficial femoral artery. Balloon angioplasty was then performed of the length of the anterior tibial artery using 2 mm, 3 mm, 3.5 mm along the length. A series of  balloon angioplasty was then performed using 2 mm, 2.5 mm, 3 mm angioplasty through the lateral plantar artery, pedal loop, deep perforating artery, dorsalis pedis, and anterior tibial artery. The balloon angioplasty was performed both through the anterior tibial artery and from the antegrade common femoral access. Repeat angiogram was demonstrating poor flow throughout this series of balloon angioplasty. Ultimately, antegrade access was performed through the 6 French sheath at the common femoral artery into the anterior tibial artery origin. 014 wire was placed distally, through the pedal loop into the lateral plantar artery. Antegrade balloon angioplasty along the length of the anterior tibial artery, dosalis pedis, and pedal loop was performed. The origin of the anterior tibial artery was treated with a 4 mm x 80 mm drug-eluting balloon. Completion balloon angioplasty was performed again through the pedal loop. Final angiogram was performed. Reversal of heparin was performed and then Angio-Seal was deployed at the left common femoral artery access. A combination of manual pressure and internal balloon angioplasty across the puncture site at the ankle was performed for hemostasis at the foot. Patient tolerated the procedure well and remained hemodynamically stable throughout. No complications were encountered and no significant blood loss. IMPRESSION: Status post left lower extremity angiogram, revascularization of occluded anterior tibial artery with angioplasty of anterior tibial artery, dorsalis pedis, deep perforating artery, lateral plantar artery, and reestablishing continuous flow through the pedal loop with 3 vessel tibial flow to the ankle. Status post deployment of Angio-Seal at the left common femoral artery Sig Yvone NeuJaime S. Loreta AveWagner, DO [facility] Electronically Signed   By: Gilmer MorJaime  Wagner D.O.   On: 11/15/2017 09:19   Ir Tib-pero  Art Pta Mod  Sed  Result Date: 11/15/2017 INDICATION: 57 year old male with a history of critical limb ischemia left foot. He presents for revascularization of tibial vasculature for wound healing capability. EXAM: ULTRASOUND GUIDED ACCESS LEFT COMMON FEMORAL ARTERY IMAGE GUIDED ACCESS LEFT DORSALIS PEDIS BALLOON ANGIOPLASTY OF ANTERIOR TIBIAL ARTERY BALLOON ANGIOPLASTY DORSALIS PEDIS ARTERY BALLOON ANGIOPLASTY OF LATERAL PLANTAR ARTERY AND DEEP PERFORATING ARTERY TREATMENT OF PROXIMAL ANTERIOR TIBIAL ARTERY WITH DRUG-ELUTING BALLOON TECHNOLOGY DEPLOYMENT OF ANGIO-SEAL FOR HEMOSTASIS MEDICATIONS: 12000 units heparin, 40 mg protamine, 1000 mcg nitroglycerin, 6 mg intra arterial tPA ANESTHESIA/SEDATION: Moderate (conscious) sedation was employed during this procedure. A total of Versed 6 mg and Fentanyl 250 mcg was administered intravenously. Moderate Sedation Time: 195 minutes. The patient's level of consciousness and vital signs were monitored continuously by radiology nursing throughout the procedure under my direct supervision. CONTRAST:  180 cc Isovue FLUOROSCOPY TIME:  Fluoroscopy Time: 39 minutes 12 seconds (292 mGy) COMPLICATIONS: None PROCEDURE: Informed consent was obtained from the patient following explanation of the procedure, risks, benefits and alternatives. The patient understands, agrees and consents for the procedure. All questions were addressed. A time out was performed prior to the initiation of the procedure. Maximal barrier sterile technique utilized including caps, mask, sterile gowns, sterile gloves, large sterile drape, hand hygiene, and Betadine prep. Ultrasound survey of the left inguinal region was performed with images stored and sent to PACs. 1% lidocaine was used for local anesthesia. Small stab incision was made with 11 blade scalpel. Blunt dissection was performed, to greater tract for the anticipated closure device. A micropuncture needle was used access the left common femoral artery under  ultrasound antegrade. With excellent arterial blood flow returned, and an .018 micro wire was passed through the needle, observed enter the SFA under fluoroscopy. The needle was removed, and a micropuncture sheath was placed over the wire. Gentle contrast injection performed. The inner dilator and wire were removed, and an 035 Bentson wire was advanced under fluoroscopy into the SFA. The sheath was removed and a standard 5 [place] vascular sheath was placed. The dilator was removed and the sheath was flushed. Angiogram was performed of the left lower extremity. A 6 French 60 cm standard sheath was then placed over the Bentson wire. A combination of 035, 018, 014 crossing catheters were used to probe a small branch from the below knee popliteal artery/tibioperoneal artery as the possible origin of anterior tibial artery at the knee. Various wires were also used, including standard Glidewire for access, gold tip 018 glidewire, 014 abbott command wire. 016 fathom wire also used for accessing the collateral. After probing the small collateral, it was evident that this was only a collateral and there was likely a flush occlusion of the anterior tibial artery. 018 crossing catheter was then advanced into the posterior tibial artery for addressing a retrograde approach to the pedal loop. 018 CX I crossing catheter and 014 command wire were used to address the distal occlusion of the deep perforating artery at the plantar loop. Using AP and lateral, the occlusion was navigated in a retrograde fashion through the dorsalis pedis and distal anterior tibial artery. Wire purchase was achieved with the 150 cm crossing catheter into the distal anterior tibial artery. Once the crossing catheter was hubbed, the command wire was removed and an 014 [place] core wire was placed. Balloon angioplasty was then performed of the pedal loop, deep perforating artery, dorsalis pedis lateral plantar artery, and the distal anterior tibial artery.  2 mm balloon  angioplasty was first performed, and then 2.5 mm angioplasty in the distal anterior tibial artery. Angiogram performed demonstrated poor retrograde filling of the anterior tibial artery. In order to retrograde recanalized the anterior tibial artery, the 2 mm angioplasty balloon was positioned at the instep, transition of anterior tibial artery to dorsalis pedis, inflated, and then punctured directly under fluoroscopic visualization. With the needle within the angioplasty balloon, av 18 wire was threaded into the balloon, which was passed retrograde. Once the needle was removed and the wire had a few cm of purchase, the angioplasty balloon was removed. The 18 wire remain place within the dorsalis pedis. Four French slender sheath was then placed into the distal anterior tibial artery. The V18 wire was removed, and retrograde crossing of the anterior tibial artery was performed with a standard Glidewire and 035 crossing catheter. With the crossing catheter positioned in the popliteal artery, the wire was removed, and gentle injection confirmed luminal position. Command wire was then placed retrograde into the superficial femoral artery. Balloon angioplasty was then performed of the length of the anterior tibial artery using 2 mm, 3 mm, 3.5 mm along the length. A series of balloon angioplasty was then performed using 2 mm, 2.5 mm, 3 mm angioplasty through the lateral plantar artery, pedal loop, deep perforating artery, dorsalis pedis, and anterior tibial artery. The balloon angioplasty was performed both through the anterior tibial artery and from the antegrade common femoral access. Repeat angiogram was demonstrating poor flow throughout this series of balloon angioplasty. Ultimately, antegrade access was performed through the 6 French sheath at the common femoral artery into the anterior tibial artery origin. 014 wire was placed distally, through the pedal loop into the lateral plantar artery. Antegrade  balloon angioplasty along the length of the anterior tibial artery, dosalis pedis, and pedal loop was performed. The origin of the anterior tibial artery was treated with a 4 mm x 80 mm drug-eluting balloon. Completion balloon angioplasty was performed again through the pedal loop. Final angiogram was performed. Reversal of heparin was performed and then Angio-Seal was deployed at the left common femoral artery access. A combination of manual pressure and internal balloon angioplasty across the puncture site at the ankle was performed for hemostasis at the foot. Patient tolerated the procedure well and remained hemodynamically stable throughout. No complications were encountered and no significant blood loss. IMPRESSION: Status post left lower extremity angiogram, revascularization of occluded anterior tibial artery with angioplasty of anterior tibial artery, dorsalis pedis, deep perforating artery, lateral plantar artery, and reestablishing continuous flow through the pedal loop with 3 vessel tibial flow to the ankle. Status post deployment of Angio-Seal at the left common femoral artery Sig Yvone Neu. Loreta Ave, DO [facility] Radiology Electronically Signed   By: Gilmer Mor D.O.   On: 11/15/2017 09:19   Ir Tib-pero Art Uni Pta Ea Add Vessel Mod Sed  Result Date: 11/15/2017 INDICATION: 57 year old male with a history of critical limb ischemia left foot. He presents for revascularization of tibial vasculature for wound healing capability. EXAM: ULTRASOUND GUIDED ACCESS LEFT COMMON FEMORAL ARTERY IMAGE GUIDED ACCESS LEFT DORSALIS PEDIS BALLOON ANGIOPLASTY OF ANTERIOR TIBIAL ARTERY BALLOON ANGIOPLASTY DORSALIS PEDIS ARTERY BALLOON ANGIOPLASTY OF LATERAL PLANTAR ARTERY AND DEEP PERFORATING ARTERY TREATMENT OF PROXIMAL ANTERIOR TIBIAL ARTERY WITH DRUG-ELUTING BALLOON TECHNOLOGY DEPLOYMENT OF ANGIO-SEAL FOR HEMOSTASIS MEDICATIONS: 12000 units heparin, 40 mg protamine,  1000 mcg nitroglycerin, 6 mg intra arterial tPA ANESTHESIA/SEDATION: Moderate (conscious) sedation was employed during this procedure. A total  of Versed 6 mg and Fentanyl 250 mcg was administered intravenously. Moderate Sedation Time: 195 minutes. The patient's level of consciousness and vital signs were monitored continuously by radiology nursing throughout the procedure under my direct supervision. CONTRAST:  180 cc Isovue FLUOROSCOPY TIME:  Fluoroscopy Time: 39 minutes 12 seconds (292 mGy) COMPLICATIONS: None PROCEDURE: Informed consent was obtained from the patient following explanation of the procedure, risks, benefits and alternatives. The patient understands, agrees and consents for the procedure. All questions were addressed. A time out was performed prior to the initiation of the procedure. Maximal barrier sterile technique utilized including caps, mask, sterile gowns, sterile gloves, large sterile drape, hand hygiene, and Betadine prep. Ultrasound survey of the left inguinal region was performed with images stored and sent to PACs. 1% lidocaine was used for local anesthesia. Small stab incision was made with 11 blade scalpel. Blunt dissection was performed, to greater tract for the anticipated closure device. A micropuncture needle was used access the left common femoral artery under ultrasound antegrade. With excellent arterial blood flow returned, and an .018 micro wire was passed through the needle, observed enter the SFA under fluoroscopy. The needle was removed, and a micropuncture sheath was placed over the wire. Gentle contrast injection performed. The inner dilator and wire were removed, and an 035 Bentson wire was advanced under fluoroscopy into the SFA. The sheath was removed and a standard 5 [place] vascular sheath was placed. The dilator was removed and the sheath was flushed. Angiogram was performed of the left lower extremity. A 6 French 60 cm standard sheath was then placed over the Bentson  wire. A combination of 035, 018, 014 crossing catheters were used to probe a small branch from the below knee popliteal artery/tibioperoneal artery as the possible origin of anterior tibial artery at the knee. Various wires were also used, including standard Glidewire for access, gold tip 018 glidewire, 014 abbott command wire. 016 fathom wire also used for accessing the collateral. After probing the small collateral, it was evident that this was only a collateral and there was likely a flush occlusion of the anterior tibial artery. 018 crossing catheter was then advanced into the posterior tibial artery for addressing a retrograde approach to the pedal loop. 018 CX I crossing catheter and 014 command wire were used to address the distal occlusion of the deep perforating artery at the plantar loop. Using AP and lateral, the occlusion was navigated in a retrograde fashion through the dorsalis pedis and distal anterior tibial artery. Wire purchase was achieved with the 150 cm crossing catheter into the distal anterior tibial artery. Once the crossing catheter was hubbed, the command wire was removed and an 014 [place] core wire was placed. Balloon angioplasty was then performed of the pedal loop, deep perforating artery, dorsalis pedis lateral plantar artery, and the distal anterior tibial artery. 2 mm balloon angioplasty was first performed, and then 2.5 mm angioplasty in the distal anterior tibial artery. Angiogram performed demonstrated poor retrograde filling of the anterior tibial artery. In order to retrograde recanalized the anterior tibial artery, the 2 mm angioplasty balloon was positioned at the instep, transition of anterior tibial artery to dorsalis pedis, inflated, and then punctured directly under fluoroscopic visualization. With the needle within the angioplasty balloon, av 18 wire was threaded into the balloon, which was passed retrograde. Once the needle was removed and the wire had a few cm of  purchase, the angioplasty balloon was removed. The 18 wire remain place within the dorsalis pedis.  Four French slender sheath was then placed into the distal anterior tibial artery. The V18 wire was removed, and retrograde crossing of the anterior tibial artery was performed with a standard Glidewire and 035 crossing catheter. With the crossing catheter positioned in the popliteal artery, the wire was removed, and gentle injection confirmed luminal position. Command wire was then placed retrograde into the superficial femoral artery. Balloon angioplasty was then performed of the length of the anterior tibial artery using 2 mm, 3 mm, 3.5 mm along the length. A series of balloon angioplasty was then performed using 2 mm, 2.5 mm, 3 mm angioplasty through the lateral plantar artery, pedal loop, deep perforating artery, dorsalis pedis, and anterior tibial artery. The balloon angioplasty was performed both through the anterior tibial artery and from the antegrade common femoral access. Repeat angiogram was demonstrating poor flow throughout this series of balloon angioplasty. Ultimately, antegrade access was performed through the 6 French sheath at the common femoral artery into the anterior tibial artery origin. 014 wire was placed distally, through the pedal loop into the lateral plantar artery. Antegrade balloon angioplasty along the length of the anterior tibial artery, dosalis pedis, and pedal loop was performed. The origin of the anterior tibial artery was treated with a 4 mm x 80 mm drug-eluting balloon. Completion balloon angioplasty was performed again through the pedal loop. Final angiogram was performed. Reversal of heparin was performed and then Angio-Seal was deployed at the left common femoral artery access. A combination of manual pressure and internal balloon angioplasty across the puncture site at the ankle was performed for hemostasis at the foot. Patient tolerated the procedure well and remained  hemodynamically stable throughout. No complications were encountered and no significant blood loss. IMPRESSION: Status post left lower extremity angiogram, revascularization of occluded anterior tibial artery with angioplasty of anterior tibial artery, dorsalis pedis, deep perforating artery, lateral plantar artery, and reestablishing continuous flow through the pedal loop with 3 vessel tibial flow to the ankle. Status post deployment of Angio-Seal at the left common femoral artery Sig Yvone Neu. Loreta Ave, DO [facility] Radiology Electronically Signed   By: Gilmer Mor D.O.   On: 11/15/2017 09:19   Ir US Guide Vasc Access Left  Result Date: 11/15/2017 INDICATION: 57 year old male with a history of critical limb ischemia left foot. He presents for revascularization of tibial vasculature for wound healing capability. EXAM: ULTRASOUND GUIDED ACCESS LEFT COMMON FEMORAL ARTERY IMAGE GUIDED ACCESS LEFT DORSALIS PEDIS BALLOON ANGIOPLASTY OF ANTERIOR TIBIAL ARTERY BALLOON ANGIOPLASTY DORSALIS PEDIS ARTERY BALLOON ANGIOPLASTY OF LATERAL PLANTAR ARTERY AND DEEP PERFORATING ARTERY TREATMENT OF PROXIMAL ANTERIOR TIBIAL ARTERY WITH DRUG-ELUTING BALLOON TECHNOLOGY DEPLOYMENT OF ANGIO-SEAL FOR HEMOSTASIS MEDICATIONS: 12000 units heparin, 40 mg protamine, 1000 mcg nitroglycerin, 6 mg intra arterial tPA ANESTHESIA/SEDATION: Moderate (conscious) sedation was employed during this procedure. A total of Versed 6 mg and Fentanyl 250 mcg was administered intravenously. Moderate Sedation Time: 195 minutes. The patient's level of consciousness and vital signs were monitored continuously by radiology nursing throughout the procedure under my direct supervision. CONTRAST:  180 cc Isovue FLUOROSCOPY TIME:  Fluoroscopy Time: 39 minutes 12 seconds (292 mGy) COMPLICATIONS: None PROCEDURE: Informed consent was obtained from the patient following explanation of the procedure, risks, benefits and  alternatives. The patient understands, agrees and consents for the procedure. All questions were addressed. A time out was performed prior to the initiation of the procedure. Maximal barrier sterile technique utilized including caps, mask, sterile gowns, sterile gloves, large sterile drape,  hand hygiene, and Betadine prep. Ultrasound survey of the left inguinal region was performed with images stored and sent to PACs. 1% lidocaine was used for local anesthesia. Small stab incision was made with 11 blade scalpel. Blunt dissection was performed, to greater tract for the anticipated closure device. A micropuncture needle was used access the left common femoral artery under ultrasound antegrade. With excellent arterial blood flow returned, and an .018 micro wire was passed through the needle, observed enter the SFA under fluoroscopy. The needle was removed, and a micropuncture sheath was placed over the wire. Gentle contrast injection performed. The inner dilator and wire were removed, and an 035 Bentson wire was advanced under fluoroscopy into the SFA. The sheath was removed and a standard 5 [place] vascular sheath was placed. The dilator was removed and the sheath was flushed. Angiogram was performed of the left lower extremity. A 6 French 60 cm standard sheath was then placed over the Bentson wire. A combination of 035, 018, 014 crossing catheters were used to probe a small branch from the below knee popliteal artery/tibioperoneal artery as the possible origin of anterior tibial artery at the knee. Various wires were also used, including standard Glidewire for access, gold tip 018 glidewire, 014 abbott command wire. 016 fathom wire also used for accessing the collateral. After probing the small collateral, it was evident that this was only a collateral and there was likely a flush occlusion of the anterior tibial artery. 018 crossing catheter was then advanced into the posterior tibial artery for addressing a  retrograde approach to the pedal loop. 018 CX I crossing catheter and 014 command wire were used to address the distal occlusion of the deep perforating artery at the plantar loop. Using AP and lateral, the occlusion was navigated in a retrograde fashion through the dorsalis pedis and distal anterior tibial artery. Wire purchase was achieved with the 150 cm crossing catheter into the distal anterior tibial artery. Once the crossing catheter was hubbed, the command wire was removed and an 014 [place] core wire was placed. Balloon angioplasty was then performed of the pedal loop, deep perforating artery, dorsalis pedis lateral plantar artery, and the distal anterior tibial artery. 2 mm balloon angioplasty was first performed, and then 2.5 mm angioplasty in the distal anterior tibial artery. Angiogram performed demonstrated poor retrograde filling of the anterior tibial artery. In order to retrograde recanalized the anterior tibial artery, the 2 mm angioplasty balloon was positioned at the instep, transition of anterior tibial artery to dorsalis pedis, inflated, and then punctured directly under fluoroscopic visualization. With the needle within the angioplasty balloon, av 18 wire was threaded into the balloon, which was passed retrograde. Once the needle was removed and the wire had a few cm of purchase, the angioplasty balloon was removed. The 18 wire remain place within the dorsalis pedis. Four French slender sheath was then placed into the distal anterior tibial artery. The V18 wire was removed, and retrograde crossing of the anterior tibial artery was performed with a standard Glidewire and 035 crossing catheter. With the crossing catheter positioned in the popliteal artery, the wire was removed, and gentle injection confirmed luminal position. Command wire was then placed retrograde into the superficial femoral artery. Balloon angioplasty was then performed of the length of the anterior tibial artery using 2 mm, 3  mm, 3.5 mm along the length. A series of balloon angioplasty was then performed using 2 mm, 2.5 mm, 3 mm angioplasty through the lateral plantar artery, pedal loop,  deep perforating artery, dorsalis pedis, and anterior tibial artery. The balloon angioplasty was performed both through the anterior tibial artery and from the antegrade common femoral access. Repeat angiogram was demonstrating poor flow throughout this series of balloon angioplasty. Ultimately, antegrade access was performed through the 6 French sheath at the common femoral artery into the anterior tibial artery origin. 014 wire was placed distally, through the pedal loop into the lateral plantar artery. Antegrade balloon angioplasty along the length of the anterior tibial artery, dosalis pedis, and pedal loop was performed. The origin of the anterior tibial artery was treated with a 4 mm x 80 mm drug-eluting balloon. Completion balloon angioplasty was performed again through the pedal loop. Final angiogram was performed. Reversal of heparin was performed and then Angio-Seal was deployed at the left common femoral artery access. A combination of manual pressure and internal balloon angioplasty across the puncture site at the ankle was performed for hemostasis at the foot. Patient tolerated the procedure well and remained hemodynamically stable throughout. No complications were encountered and no significant blood loss. IMPRESSION: Status post left lower extremity angiogram, revascularization of occluded anterior tibial artery with angioplasty of anterior tibial artery, dorsalis pedis, deep perforating artery, lateral plantar artery, and reestablishing continuous flow through the pedal loop with 3 vessel tibial flow to the ankle. Status post deployment of Angio-Seal at the left common femoral artery Sig Yvone Neu. Loreta Ave, DO [facility] Radiology Electronically Signed   By: Gilmer Mor D.O.   On:  11/15/2017 09:19    Labs:  CBC: Recent Labs    11/08/17 1852 11/10/17 1815 11/11/17 0457 11/14/17 0936  WBC 12.6* 13.7* 11.2* 9.6  HGB 15.0 15.1 13.5 12.8*  HCT 44.9 44.8 40.5 38.8*  PLT 242 310 273 272    COAGS: Recent Labs    11/08/17 1852 11/10/17 1815  INR 1.08 1.05    BMP: Recent Labs    11/08/17 1852 11/10/17 1815 11/11/17 0457 11/14/17 0936  NA 135 137 137 135  K 4.0 4.3 3.9 4.3  CL 97* 99* 101 97*  CO2 27 28 26 29   GLUCOSE 126* 118* 108* 121*  BUN 11 9 8 11   CALCIUM 9.2 9.1 8.6* 8.6*  CREATININE 0.91 0.88 0.85 0.92  GFRNONAA >60 >60 >60 >60  GFRAA >60 >60 >60 >60    LIVER FUNCTION TESTS: Recent Labs    11/08/17 1852 11/10/17 1815 11/11/17 0457 11/14/17 0936  BILITOT 0.4 0.4 0.4 0.7  AST 16 16 15 15   ALT 22 21 18 17   ALKPHOS 78 78 74 71  PROT 7.4 7.5 6.1* 6.3*  ALBUMIN 3.3* 3.4* 2.9* 2.9*    Assessment and Plan: Patient with history of osteomyelitis left foot, peripheral vascular disease, diabetes, tobacco abuse.  Status post left lower extremity arteriogram with revascularization of occluded anterior tibial artery with angioplasty of anterior tibial, dorsalis pedis, deep perforating artery and lateral plantar artery on 4/5.  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  Currently stable.  Receiving physical therapy.  Afebrile, no new labs, on aspirin and heparin injections currently.  Plans as per Dr. Jamison Oka.   Electronically Signed: D. Jeananne Rama, PA-C 11/16/2017, 12:28 PM   I spent a total of 15 minutes at the the patient's bedside AND on the patient's hospital floor or unit, greater than 50% of which was counseling/coordinating care for lower extremity arteriogram with angioplasty    Patient ID: Cody Pena, male   DOB: 08-16-1960, 56  y.o.   MRN: [id number]

## 2017-11-20 ENCOUNTER — Ambulatory Visit (INDEPENDENT_AMBULATORY_CARE_PROVIDER_SITE_OTHER): Payer: 59

## 2017-11-20 ENCOUNTER — Encounter (INDEPENDENT_AMBULATORY_CARE_PROVIDER_SITE_OTHER): Payer: Self-pay

## 2017-11-20 VITALS — Ht 75.0 in | Wt 233.0 lb

## 2017-11-20 DIAGNOSIS — Z89422 Acquired absence of other left toe(s): Secondary | ICD-10-CM

## 2017-11-20 LAB — AEROBIC/ANAEROBIC CULTURE W GRAM STAIN (SURGICAL/DEEP WOUND)

## 2017-11-20 LAB — AEROBIC/ANAEROBIC CULTURE (SURGICAL/DEEP WOUND)

## 2017-11-20 NOTE — Progress Notes (Signed)
Patient is in the office today 5 days s/p left foot 2nd ray amputation 11/15/17. He is non weight bearing with crutches and post op shoe.  The patient reports that he had removed his own surgical dressing due to drainage that had come through the bandage. The incision is slightly open on the bottom of the foot. There is a small amount of bloody drainage. There is some swelling and the pt reports that he has not been elevating his foot " as much as he should" that he has a "bad habit of sitting on the edge of the bed" for long periods of time. Advised the pt to wash the foot with Dial soap and water, to pat or allow to air dry and cover with a dry dressing daily. Advised the pt to elevate his foot higher than his heart and to remain non weight bearing with his crutches. To call with any questions or concerns.  Pt will follow up in the office in one week with Dr. Lajoyce Cornersuda.   Autumn Forrest, RMA, [employer]

## 2017-11-27 ENCOUNTER — Encounter (INDEPENDENT_AMBULATORY_CARE_PROVIDER_SITE_OTHER): Payer: Self-pay | Admitting: Orthopedic Surgery

## 2017-11-27 ENCOUNTER — Ambulatory Visit (INDEPENDENT_AMBULATORY_CARE_PROVIDER_SITE_OTHER): Payer: 59 | Admitting: Orthopedic Surgery

## 2017-11-27 VITALS — Ht 75.0 in | Wt 233.0 lb

## 2017-11-27 DIAGNOSIS — Z89422 Acquired absence of other left toe(s): Secondary | ICD-10-CM

## 2017-11-27 NOTE — Progress Notes (Signed)
Office Visit Note   Patient: Cody Pena           Date of Birth: 1961-06-07           MRN: [id number] Visit Date: 11/27/2017              Requested by: Aida Puffer, MD [address] [place] [address] PCP: Aida Puffer, MD  Chief Complaint  Patient presents with  . Left Foot - Routine Post Op    11/15/17 left foot 2nd ray amp      HPI: Patient is a 57 year old gentleman status post left foot second ray amputation.  Patient states he has redness when he elevates the foot the redness resolves.  Patient is currently on Bactrim DS.  Patient is still smoking.  Assessment & Plan: Visit Diagnoses:  1. 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  He will elevation continue nonweightbearing continue dressing changes with wound care.  Complete his antibiotics.  Follow-Up Instructions: Return in about 2 weeks (around 12/11/2017).   Ortho Exam  Patient is alert, oriented, no adenopathy, well-dressed, normal affect, normal respiratory effort. Examination patient does have a palpable pulse there is no cellulitis there is some dependent redness which resolves with elevation.  There is no tenderness to palpation no drainage no signs of abscess no wound dehiscence.  Imaging: No results found. No images are attached to the encounter.  Labs: Lab Results  Component Value Date   HGBA1C 7.2 (H) 11/10/2017   HGBA1C 8.1 (H) 12/07/2014   ESRSEDRATE 60 (H) 11/10/2017   ESRSEDRATE 20 (H) 12/07/2014   CRP 3.2 (H) 11/10/2017   CRP 7.0 (H) 12/07/2014   REPTSTATUS 11/20/2017 FINAL 11/15/2017   GRAMSTAIN  11/15/2017    FEW WBC PRESENT, PREDOMINANTLY PMN RARE GRAM POSITIVE COCCI RARE GRAM POSITIVE RODS    CULT  11/15/2017    ABUNDANT DIPHTHEROIDS(CORYNEBACTERIUM SPECIES) Standardized susceptibility testing for this organism is not available. ABUNDANT VIRIDANS STREPTOCOCCUS NO  ANAEROBES ISOLATED Performed at [facility], [address] [place], [place] [address]     [email or web address] [phone]  Body mass index is 29.12 kg/m.  Orders:  No orders of the defined types were placed in this encounter.  No orders of the defined types were placed in this encounter.    Procedures: No procedures performed  Clinical Data: No additional findings.  ROS:  All other systems negative, except as noted in the HPI. Review of Systems  Objective: Vital Signs: Ht 6\' 3"  (1.905 m)   Wt 233 lb (105.7 kg)   BMI 29.12 kg/m   Specialty Comments:  No specialty comments available.  PMFS History: Patient Active Problem List   Diagnosis Date Noted  . Foot osteomyelitis, left (HCC)   . Septic arthritis of foot (HCC) 11/11/2017  . Osteomyelitis of ankle or foot, acute, left (HCC) 11/11/2017  . Type 2 DM with diabetic peripheral angiopathy w/o gangrene (HCC) 11/11/2017  . Cigarette smoker 11/11/2017  . Diabetic peripheral neuropathy (HCC) 11/11/2017  . Diabetic foot infection (HCC) 11/10/2017  . Post op infection   . Delirium 12/07/2014  . Cervical vertebral fusion 12/07/2014  . Hallucination 12/07/2014  . Mood disorder (HCC) 12/07/2014  . Pyrexia   . Neck swelling    Past Medical History:  Diagnosis Date  . Diabetes mellitus without complication (HCC)   . Hypertension  History reviewed. No pertinent family history.  Past Surgical History:  Procedure Laterality Date  . AMPUTATION Left 11/15/2017   Procedure: LEFT FOOT 2ND RAY AMPUTATION;  Surgeon: Nadara Mustarduda, Marcus V, MD;  [facility];  Service: Orthopedics;  Laterality: Left;  . CERVICAL FUSION    . IR ANGIOGRAM EXTREMITY LEFT  11/14/2017  . IR ANGIOGRAM SELECTIVE EACH ADDITIONAL VESSEL  11/14/2017  . IR ANGIOGRAM SELECTIVE EACH ADDITIONAL VESSEL  11/14/2017  . IR TIB-PERO ART PTA MOD SED  11/14/2017  . IR TIB-PERO ART UNI PTA EA ADD VESSEL MOD SED  11/14/2017  . IR US GUIDE VASC ACCESS LEFT  11/14/2017    Social History   Occupational History  . Not on file  Tobacco Use  . Smoking status: Current Every Day Smoker  . Smokeless tobacco: Never Used  Substance and Sexual Activity  . Alcohol use: No  . Drug use: No  . Sexual activity: Not on file

## 2017-12-11 ENCOUNTER — Encounter (INDEPENDENT_AMBULATORY_CARE_PROVIDER_SITE_OTHER): Payer: Self-pay | Admitting: Orthopedic Surgery

## 2017-12-11 ENCOUNTER — Ambulatory Visit (INDEPENDENT_AMBULATORY_CARE_PROVIDER_SITE_OTHER): Payer: 59 | Admitting: Orthopedic Surgery

## 2017-12-11 VITALS — Ht 75.0 in | Wt 233.0 lb

## 2017-12-11 DIAGNOSIS — Z89422 Acquired absence of other left toe(s): Secondary | ICD-10-CM

## 2017-12-11 MED ORDER — PENTOXIFYLLINE ER 400 MG PO TBCR: 400.0000 mg | EXTENDED_RELEASE_TABLET | Freq: Three times a day (TID) | ORAL | 3 refills | Status: DC

## 2017-12-11 NOTE — Progress Notes (Signed)
Office Visit Note   Patient: Cody Pena           Date of Birth: 13-Jan-1961           MRN: [id number] Visit Date: 12/11/2017              Requested by: Aida Puffer, MD [address] [place] [address] PCP: Aida Puffer, MD  Chief Complaint  Patient presents with  . Left Foot - Routine Post Op    11/15/17 left foot 2nd ray amputation       HPI: Patient presents 4 weeks status post left foot second ray amputation he has been on crutches postoperative shoe he has been on Bactrim DS.  Patient is also on Plavix.  Assessment & Plan: Visit Diagnoses:  1. 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  Follow-Up Instructions: Return in about 2 weeks (around 12/25/2017).   Ortho Exam  Patient is alert, oriented, no adenopathy, well-dressed, normal affect, normal respiratory effort. Examination the incision is healing nicely plantarly there is been a slight gaping of the wound but there is good granulation tissue at the base.  We will harvest the sutures today continue wound care continue protected weightbearing there is no redness no cellulitis no odor no drainage he has a strong posterior tibial pulse.  Imaging: No results found. No images are attached to the encounter.  Labs: Lab Results  Component Value Date   HGBA1C 7.2 (H) 11/10/2017   HGBA1C 8.1 (H) 12/07/2014   ESRSEDRATE 60 (H) 11/10/2017   ESRSEDRATE 20 (H) 12/07/2014   CRP 3.2 (H) 11/10/2017   CRP 7.0 (H) 12/07/2014   REPTSTATUS 11/20/2017 FINAL 11/15/2017   GRAMSTAIN  11/15/2017    FEW WBC PRESENT, PREDOMINANTLY PMN RARE GRAM POSITIVE COCCI RARE GRAM POSITIVE RODS    CULT  11/15/2017    ABUNDANT DIPHTHEROIDS(CORYNEBACTERIUM SPECIES) Standardized susceptibility testing for this  organism is not available. ABUNDANT VIRIDANS STREPTOCOCCUS NO ANAEROBES ISOLATED Performed at [facility], [address], [place], [place] [address]     Lab Results  Component Value Date/Time   HGBA1C 7.2 (H) 11/10/2017 09:36 PM   HGBA1C 8.1 (H) 12/07/2014 03:40 AM    Body mass index is 29.12 kg/m.  Orders:  No orders of the defined types were placed in this encounter.  Meds ordered this encounter  Medications  . pentoxifylline (TRENTAL) 400 MG CR tablet    Sig: Take 1 tablet (400 mg total) by mouth 3 (three) times daily with meals.    Dispense:  90 tablet    Refill:  3     Procedures: No procedures performed  Clinical Data: No additional findings.  ROS:  All other systems negative, except as noted in the HPI. Review of Systems  Objective: Vital Signs: Ht  (1.905 m)   Wt 233 lb (105.7 kg)   BMI 29.12 kg/m   Specialty Comments:  No specialty comments available.  PMFS History: Patient Active Problem List   Diagnosis Date Noted  . Foot osteomyelitis, left (HCC)   . Septic arthritis of foot (HCC) 11/11/2017  . Osteomyelitis of ankle or foot, acute, left (HCC) 11/11/2017  . Type 2 DM with diabetic peripheral angiopathy w/o gangrene (HCC) 11/11/2017  . Cigarette smoker 11/11/2017  .  Diabetic peripheral neuropathy (HCC) 11/11/2017  . Diabetic foot infection (HCC) 11/10/2017  . Post op infection   . Delirium 12/07/2014  . Cervical vertebral fusion 12/07/2014  . Hallucination 12/07/2014  . Mood disorder (HCC) 12/07/2014  . Pyrexia   . Neck swelling    Past Medical History:  Diagnosis Date  . Diabetes mellitus without complication (HCC)   . Hypertension     History reviewed. No pertinent family history.  Past Surgical History:  Procedure Laterality Date  . AMPUTATION Left 11/15/2017   Procedure: LEFT FOOT 2ND RAY AMPUTATION;  Surgeon: Nadara Mustard, MD;  Location: [facility] OR;  Service: Orthopedics;  Laterality: Left;  . CERVICAL FUSION    . IR  ANGIOGRAM EXTREMITY LEFT  11/14/2017  . IR ANGIOGRAM SELECTIVE EACH ADDITIONAL VESSEL  11/14/2017  . IR ANGIOGRAM SELECTIVE EACH ADDITIONAL VESSEL  11/14/2017  . IR TIB-PERO ART PTA MOD SED  11/14/2017  . IR TIB-PERO ART UNI PTA EA ADD VESSEL MOD SED  11/14/2017  . IR US GUIDE VASC ACCESS LEFT  11/14/2017   Social History   Occupational History  . Not on file  Tobacco Use  . Smoking status: Current Every Day Smoker  . Smokeless tobacco: Never Used  Substance and Sexual Activity  . Alcohol use: No  . Drug use: No  . Sexual activity: Not on file

## 2017-12-25 ENCOUNTER — Encounter (INDEPENDENT_AMBULATORY_CARE_PROVIDER_SITE_OTHER): Payer: Self-pay | Admitting: Orthopedic Surgery

## 2017-12-25 ENCOUNTER — Ambulatory Visit (INDEPENDENT_AMBULATORY_CARE_PROVIDER_SITE_OTHER): Payer: 59 | Admitting: Orthopedic Surgery

## 2017-12-25 VITALS — Ht 75.0 in | Wt 233.0 lb

## 2017-12-25 DIAGNOSIS — Z89422 Acquired absence of other left toe(s): Secondary | ICD-10-CM

## 2017-12-25 NOTE — Progress Notes (Signed)
Office Visit Note   Patient: Cody Pena           Date of Birth: November 19, 1960           MRN: [id number] Visit Date: 12/25/2017              Requested by: Aida Puffer, MD [address], [place] [address] PCP: Aida Puffer, MD  Chief Complaint  Patient presents with  . Left Foot - Routine Post Op    11/15/17 left foot 2nd ray amputation       HPI: Patient presents about 5 weeks status post left foot second ray amputation he has been nonweightbearing he has completed his course of Bactrim DS he is currently on Trental.  He is nonweightbearing with crutches.  Assessment & Plan: Visit Diagnoses:  1. 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  Follow-Up Instructions: Return in about 3 weeks (around 01/15/2018).   Ortho Exam  Patient is alert, oriented, no adenopathy, well-dressed, normal affect, normal respiratory effort. Examination the incision is well-healed there is no redness no cellulitis no swelling no signs of infection plantarly the wound is gaped open and millimeter by 3 mm and 1 mm deep there is no drainage no odor no signs of infection.  Imaging: No results found. No images are attached to the encounter.  Labs: Lab Results  Component Value Date   HGBA1C 7.2 (H) 11/10/2017   HGBA1C 8.1 (H) 12/07/2014   ESRSEDRATE 60 (H) 11/10/2017   ESRSEDRATE 20 (H) 12/07/2014   CRP 3.2 (H) 11/10/2017   CRP 7.0 (H) 12/07/2014   REPTSTATUS 11/20/2017 FINAL 11/15/2017   GRAMSTAIN  11/15/2017    FEW WBC PRESENT, PREDOMINANTLY PMN RARE GRAM POSITIVE COCCI RARE GRAM POSITIVE RODS    CULT  11/15/2017    ABUNDANT DIPHTHEROIDS(CORYNEBACTERIUM SPECIES) Standardized susceptibility testing for this organism is not available. ABUNDANT VIRIDANS STREPTOCOCCUS NO ANAEROBES ISOLATED Performed at [facility], [address] [place], [place] [address]      Lab Results  Component Value Date   ALBUMIN 2.9 (L) 11/14/2017   ALBUMIN 2.9 (L) 11/11/2017   ALBUMIN 3.4 (L) 11/10/2017    Body mass index is 29.12 kg/m.  Orders:  No orders of the defined types were placed in this encounter.  No orders of the defined types were placed in this encounter.    Procedures: No procedures performed  Clinical Data: No additional findings.  ROS:  All other systems negative, except as noted in the HPI. Review of Systems  Objective: Vital Signs: Ht  (1.905 m)   Wt 233 lb (105.7 kg)   BMI 29.12 kg/m   Specialty Comments:  No specialty comments available.  PMFS History: Patient Active Problem List   Diagnosis Date Noted  . Foot osteomyelitis, left (HCC)   . Septic arthritis of foot (HCC) 11/11/2017  . Osteomyelitis of ankle or foot, acute, left (HCC) 11/11/2017  . Type 2 DM with diabetic peripheral angiopathy w/o gangrene (HCC) 11/11/2017  . Cigarette smoker 11/11/2017  . Diabetic peripheral neuropathy (HCC) 11/11/2017  . Diabetic foot infection (HCC) 11/10/2017  . Post op infection   . Delirium 12/07/2014  . Cervical vertebral fusion 12/07/2014  . Hallucination 12/07/2014  . Mood disorder (HCC) 12/07/2014  . Pyrexia   .  Neck swelling    Past Medical History:  Diagnosis Date  . Diabetes mellitus without complication (HCC)   . Hypertension     History reviewed. No pertinent family history.  Past Surgical History:  Procedure Laterality Date  . AMPUTATION Left 11/15/2017   Procedure: LEFT FOOT 2ND RAY AMPUTATION;  Surgeon: Nadara Mustard, MD;  Location: [facility] OR;  Service: Orthopedics;  Laterality: Left;  . CERVICAL FUSION    . IR ANGIOGRAM EXTREMITY LEFT  11/14/2017  . IR ANGIOGRAM SELECTIVE EACH ADDITIONAL VESSEL  11/14/2017  . IR ANGIOGRAM SELECTIVE EACH ADDITIONAL VESSEL  11/14/2017  . IR TIB-PERO ART PTA MOD SED  11/14/2017  . IR TIB-PERO ART UNI PTA EA ADD VESSEL  MOD SED  11/14/2017  . IR US GUIDE VASC ACCESS LEFT  11/14/2017   Social History   Occupational History  . Not on file  Tobacco Use  . Smoking status: Current Every Day Smoker  . Smokeless tobacco: Never Used  Substance and Sexual Activity  . Alcohol use: No  . Drug use: No  . Sexual activity: Not on file

## 2018-01-14 ENCOUNTER — Ambulatory Visit (INDEPENDENT_AMBULATORY_CARE_PROVIDER_SITE_OTHER): Payer: 59 | Admitting: Orthopedic Surgery

## 2018-01-14 ENCOUNTER — Encounter (INDEPENDENT_AMBULATORY_CARE_PROVIDER_SITE_OTHER): Payer: Self-pay | Admitting: Orthopedic Surgery

## 2018-01-14 VITALS — Ht 75.0 in | Wt 233.0 lb

## 2018-01-14 DIAGNOSIS — L97521 Non-pressure chronic ulcer of other part of left foot limited to breakdown of skin: Secondary | ICD-10-CM | POA: Diagnosis not present

## 2018-01-14 DIAGNOSIS — Z89422 Acquired absence of other left toe(s): Secondary | ICD-10-CM | POA: Diagnosis not present

## 2018-01-14 NOTE — Progress Notes (Signed)
Office Visit Note   Patient: Cody Pena           Date of Birth: [birth year]           MRN: [id number] Visit Date: 01/14/2018              Requested by: Aida Puffer, MD [address] [place] [address] PCP: Aida Puffer, MD  Chief Complaint  Patient presents with  . Left Foot - Routine Post Op    11/15/17 Left foot 2nd ray amputation       HPI: Patient is a 57 year old gentleman who presents for follow-up status post left foot second ray amputation.  He states his been full weightbearing and has an ulcer on the plantar aspect of his left foot.  Assessment & Plan: Visit Diagnoses:  1. 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 Ulcer of left foot, limited to breakdown of skin (HCC)     Plan: Ulcer was debrided of skin and soft tissue.  Recommended protected weightbearing continue with washing with soap and water antibiotic ointment dressing changes daily.  Follow-Up Instructions: Return in about 1 month (around 02/11/2018).   Ortho Exam  Patient is alert, oriented, no adenopathy, well-dressed, normal affect, normal respiratory effort. Examination patient's foot is plantigrade there is no redness no cellulitis the dorsal incision is completely healed laterally patient has an ulcer with hyper granulation tissue and callus tissue.  After informed consent a 10 blade knife was used to debride the skin and soft tissue back to bleeding viable granulation tissue.  This was touched with silver nitrate the ulcer was 7 mm x 2 cm and 3 mm deep.  There is no purulence no abscess no exposed bone or tendon no signs of infection.  Imaging: No results found. No images are attached to the encounter.  Labs: Lab Results  Component Value Date   HGBA1C 7.2 (H) 11/10/2017   HGBA1C 8.1 (H) 12/07/2014   ESRSEDRATE 60 (H) 11/10/2017   ESRSEDRATE 20 (H) 12/07/2014   CRP 3.2 (H) 11/10/2017   CRP 7.0 (H) 12/07/2014   REPTSTATUS 11/20/2017 FINAL 11/15/2017   GRAMSTAIN  11/15/2017    FEW WBC PRESENT, PREDOMINANTLY PMN RARE GRAM POSITIVE COCCI RARE GRAM POSITIVE RODS    CULT  11/15/2017    ABUNDANT DIPHTHEROIDS(CORYNEBACTERIUM SPECIES) Standardized susceptibility testing for this organism is not available. ABUNDANT VIRIDANS STREPTOCOCCUS NO ANAEROBES ISOLATED Performed at [facility], [address] [place], [place] [address]      Lab Results  Component Value Date   ALBUMIN 2.9 (L) 11/14/2017   ALBUMIN 2.9 (L) 11/11/2017   ALBUMIN 3.4 (L) 11/10/2017    Body mass index is 29.12 kg/m.  Orders:  No orders of the defined types were placed in this encounter.  No orders of the defined types were placed in this encounter.    Procedures: No procedures performed  Clinical Data: No additional findings.  ROS:  All other systems negative, except as noted in the HPI. Review of Systems  Objective: Vital Signs: Ht 6\' 3"  (1.905 m)   Wt 233 lb (105.7 kg)   BMI 29.12 kg/m   Specialty Comments:  No specialty comments available.  PMFS History: Patient Active Problem List   Diagnosis Date Noted  . 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 Ulcer of left foot, limited to breakdown of skin (HCC) 01/14/2018  . Foot  osteomyelitis, left (HCC)   . Septic arthritis of foot (HCC) 11/11/2017  . Osteomyelitis of ankle or foot, acute, left (HCC) 11/11/2017  . Type 2 DM with diabetic peripheral angiopathy w/o gangrene (HCC) 11/11/2017  . Cigarette smoker 11/11/2017  . Diabetic peripheral neuropathy (HCC) 11/11/2017  . Diabetic foot infection (HCC) 11/10/2017  . Post op infection   . Delirium 12/07/2014  . Cervical vertebral fusion 12/07/2014  . Hallucination 12/07/2014  . Mood disorder (HCC) 12/07/2014  . Pyrexia   . Neck swelling    Past Medical History:  Diagnosis Date  . Diabetes mellitus without complication (HCC)   . Hypertension     History reviewed. No pertinent family  history.  Past Surgical History:  Procedure Laterality Date  . AMPUTATION Left 11/15/2017   Procedure: LEFT FOOT 2ND RAY AMPUTATION;  Surgeon: Nadara Mustarduda, Marcus V, MD;  [facility];  Service: Orthopedics;  Laterality: Left;  . CERVICAL FUSION    . IR ANGIOGRAM EXTREMITY LEFT  11/14/2017  . IR ANGIOGRAM SELECTIVE EACH ADDITIONAL VESSEL  11/14/2017  . IR ANGIOGRAM SELECTIVE EACH ADDITIONAL VESSEL  11/14/2017  . IR TIB-PERO ART PTA MOD SED  11/14/2017  . IR TIB-PERO ART UNI PTA EA ADD VESSEL MOD SED  11/14/2017  . IR US GUIDE VASC ACCESS LEFT  11/14/2017   Social History   Occupational History  . Not on file  Tobacco Use  . Smoking status: Current Every Day Smoker  . Smokeless tobacco: Never Used  Substance and Sexual Activity  . Alcohol use: No  . Drug use: No  . Sexual activity: Not on file

## 2018-01-15 ENCOUNTER — Ambulatory Visit (INDEPENDENT_AMBULATORY_CARE_PROVIDER_SITE_OTHER): Payer: 59 | Admitting: Orthopedic Surgery

## 2018-02-11 ENCOUNTER — Ambulatory Visit (INDEPENDENT_AMBULATORY_CARE_PROVIDER_SITE_OTHER): Payer: 59 | Admitting: Orthopedic Surgery

## 2018-02-26 ENCOUNTER — Ambulatory Visit (INDEPENDENT_AMBULATORY_CARE_PROVIDER_SITE_OTHER): Payer: 59 | Admitting: Family

## 2021-07-23 ENCOUNTER — Emergency Department (HOSPITAL_COMMUNITY): Payer: 59

## 2021-07-23 ENCOUNTER — Encounter (HOSPITAL_COMMUNITY): Payer: Self-pay | Admitting: Emergency Medicine

## 2021-07-23 ENCOUNTER — Inpatient Hospital Stay (HOSPITAL_COMMUNITY)
Admission: EM | Admit: 2021-07-23 | Discharge: 2021-07-26 | DRG: 300 | Disposition: A | Discharge status: Home / Self Care | Payer: 59 | Attending: Internal Medicine | Admitting: Internal Medicine

## 2021-07-23 ENCOUNTER — Other Ambulatory Visit: Payer: Self-pay

## 2021-07-23 ENCOUNTER — Observation Stay (HOSPITAL_COMMUNITY): Payer: 59

## 2021-07-23 DIAGNOSIS — Z20822 Contact with and (suspected) exposure to covid-19: Secondary | ICD-10-CM | POA: Diagnosis present

## 2021-07-23 DIAGNOSIS — Z91138 Patient's unintentional underdosing of medication regimen for other reason: Secondary | ICD-10-CM

## 2021-07-23 DIAGNOSIS — M86172 Other acute osteomyelitis, left ankle and foot: Secondary | ICD-10-CM | POA: Diagnosis not present

## 2021-07-23 DIAGNOSIS — T383X6A Underdosing of insulin and oral hypoglycemic [antidiabetic] drugs, initial encounter: Secondary | ICD-10-CM | POA: Diagnosis present

## 2021-07-23 DIAGNOSIS — Z95820 Peripheral vascular angioplasty status with implants and grafts: Secondary | ICD-10-CM

## 2021-07-23 DIAGNOSIS — Z79899 Other long term (current) drug therapy: Secondary | ICD-10-CM

## 2021-07-23 DIAGNOSIS — M869 Osteomyelitis, unspecified: Secondary | ICD-10-CM | POA: Diagnosis not present

## 2021-07-23 DIAGNOSIS — Z7902 Long term (current) use of antithrombotics/antiplatelets: Secondary | ICD-10-CM

## 2021-07-23 DIAGNOSIS — E1152 Type 2 diabetes mellitus with diabetic peripheral angiopathy with gangrene: Secondary | ICD-10-CM | POA: Diagnosis not present

## 2021-07-23 DIAGNOSIS — Z794 Long term (current) use of insulin: Secondary | ICD-10-CM

## 2021-07-23 DIAGNOSIS — L97429 Non-pressure chronic ulcer of left heel and midfoot with unspecified severity: Secondary | ICD-10-CM | POA: Diagnosis present

## 2021-07-23 DIAGNOSIS — L97509 Non-pressure chronic ulcer of other part of unspecified foot with unspecified severity: Secondary | ICD-10-CM | POA: Diagnosis present

## 2021-07-23 DIAGNOSIS — E1142 Type 2 diabetes mellitus with diabetic polyneuropathy: Secondary | ICD-10-CM | POA: Diagnosis present

## 2021-07-23 DIAGNOSIS — E785 Hyperlipidemia, unspecified: Secondary | ICD-10-CM | POA: Diagnosis present

## 2021-07-23 DIAGNOSIS — F39 Unspecified mood [affective] disorder: Secondary | ICD-10-CM | POA: Diagnosis present

## 2021-07-23 DIAGNOSIS — E1151 Type 2 diabetes mellitus with diabetic peripheral angiopathy without gangrene: Secondary | ICD-10-CM | POA: Diagnosis present

## 2021-07-23 DIAGNOSIS — L089 Local infection of the skin and subcutaneous tissue, unspecified: Principal | ICD-10-CM

## 2021-07-23 DIAGNOSIS — F419 Anxiety disorder, unspecified: Secondary | ICD-10-CM | POA: Diagnosis present

## 2021-07-23 DIAGNOSIS — L97416 Non-pressure chronic ulcer of right heel and midfoot with bone involvement without evidence of necrosis: Secondary | ICD-10-CM | POA: Diagnosis present

## 2021-07-23 DIAGNOSIS — F32A Depression, unspecified: Secondary | ICD-10-CM | POA: Diagnosis present

## 2021-07-23 DIAGNOSIS — Z89422 Acquired absence of other left toe(s): Secondary | ICD-10-CM

## 2021-07-23 DIAGNOSIS — E11628 Type 2 diabetes mellitus with other skin complications: Secondary | ICD-10-CM

## 2021-07-23 DIAGNOSIS — E1169 Type 2 diabetes mellitus with other specified complication: Secondary | ICD-10-CM | POA: Diagnosis present

## 2021-07-23 DIAGNOSIS — F17210 Nicotine dependence, cigarettes, uncomplicated: Secondary | ICD-10-CM | POA: Diagnosis present

## 2021-07-23 DIAGNOSIS — E11621 Type 2 diabetes mellitus with foot ulcer: Secondary | ICD-10-CM | POA: Diagnosis present

## 2021-07-23 DIAGNOSIS — I10 Essential (primary) hypertension: Secondary | ICD-10-CM | POA: Diagnosis present

## 2021-07-23 LAB — HEMOGLOBIN A1C
Hgb A1c MFr Bld: 12.5 % — ABNORMAL HIGH (ref 4.8–5.6)
Mean Plasma Glucose: 312.05 mg/dL

## 2021-07-23 LAB — COMPREHENSIVE METABOLIC PANEL
ALT: 12 U/L (ref 0–44)
AST: 11 U/L — ABNORMAL LOW (ref 15–41)
Albumin: 3.1 g/dL — ABNORMAL LOW (ref 3.5–5.0)
Alkaline Phosphatase: 93 U/L (ref 38–126)
Anion gap: 9 (ref 5–15)
BUN: 9 mg/dL (ref 8–23)
CO2: 27 mmol/L (ref 22–32)
Calcium: 8.5 mg/dL — ABNORMAL LOW (ref 8.9–10.3)
Chloride: 96 mmol/L — ABNORMAL LOW (ref 98–111)
Creatinine, Ser: 0.9 mg/dL (ref 0.61–1.24)
GFR, Estimated: >60 mL/min (ref >60)
Glucose, Bld: 338 mg/dL — ABNORMAL HIGH (ref 70–99)
Potassium: 3.9 mmol/L (ref 3.5–5.1)
Sodium: 132 mmol/L — ABNORMAL LOW (ref 135–145)
Total Bilirubin: 0.5 mg/dL (ref 0.3–1.2)
Total Protein: 6.5 g/dL (ref 6.5–8.1)

## 2021-07-23 LAB — RESP PANEL BY RT-PCR (FLU A&B, COVID) ARPGX2
Influenza A by PCR: NEGATIVE
Influenza B by PCR: NEGATIVE
SARS Coronavirus 2 by RT PCR: NEGATIVE

## 2021-07-23 LAB — CBC WITH DIFFERENTIAL/PLATELET
Abs Immature Granulocytes: 0.05 10*3/uL (ref 0.00–0.07)
Basophils Absolute: 0.1 10*3/uL (ref 0.0–0.1)
Basophils Relative: 1 %
Eosinophils Absolute: 0.3 10*3/uL (ref 0.0–0.5)
Eosinophils Relative: 2 %
HCT: 45.3 % (ref 39.0–52.0)
Hemoglobin: 15.8 g/dL (ref 13.0–17.0)
Immature Granulocytes: 1 %
Lymphocytes Relative: 24 %
Lymphs Abs: 2.5 10*3/uL (ref 0.7–4.0)
MCH: 33.2 pg (ref 26.0–34.0)
MCHC: 34.9 g/dL (ref 30.0–36.0)
MCV: 95.2 fL (ref 80.0–100.0)
Monocytes Absolute: 0.7 10*3/uL (ref 0.1–1.0)
Monocytes Relative: 7 %
Neutro Abs: 7 10*3/uL (ref 1.7–7.7)
Neutrophils Relative %: 65 %
Platelets: 155 10*3/uL (ref 150–400)
RBC: 4.76 MIL/uL (ref 4.22–5.81)
RDW: 11.8 % (ref 11.5–15.5)
WBC: 10.6 10*3/uL — ABNORMAL HIGH (ref 4.0–10.5)
nRBC: 0 % (ref 0.0–0.2)

## 2021-07-23 LAB — CBG MONITORING, ED
Glucose-Capillary: 341 mg/dL — ABNORMAL HIGH (ref 70–99)
Glucose-Capillary: 239 mg/dL — ABNORMAL HIGH (ref 70–99)

## 2021-07-23 LAB — LACTIC ACID, PLASMA: Lactic Acid, Venous: 1.2 mmol/L (ref 0.5–1.9)

## 2021-07-23 IMAGING — MR MR FOOT*L* W/O CM
5 series · 38 of 40 positions shown · non-contrast
Comparison: X-ray [DATE], MRI [DATE]

CLINICAL DATA: Diabetic foot ulcer

EXAM:
MRI OF THE LEFT FOOT WITHOUT CONTRAST
TECHNIQUE: Multiplanar, multisequence MR imaging of the left forefoot was
performed. No intravenous contrast was administered.

[Series 3: T1 · coronal · left · 3.3mm · 0.47mm/px · 9 of 40 slices shown (1 of 2)]
[im 1/40]
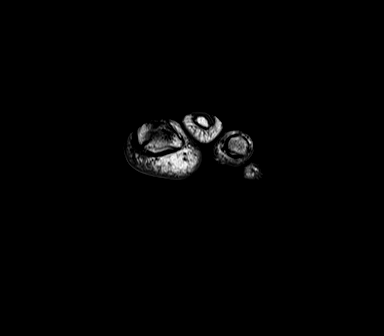
[im 5/40]
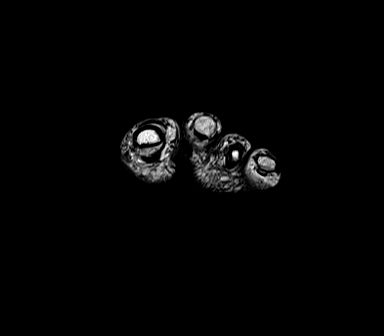
[im 10/40]
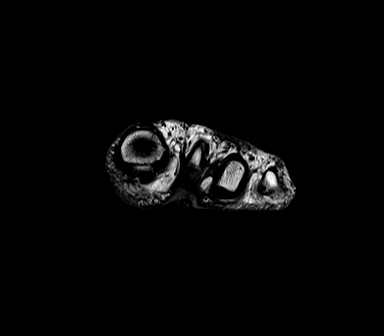
[im 15/40]
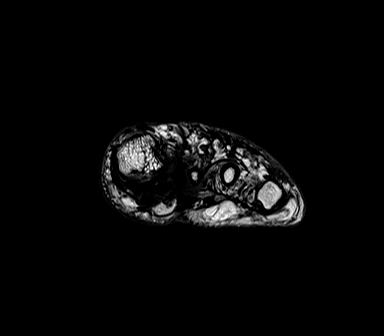
[im 20/40]
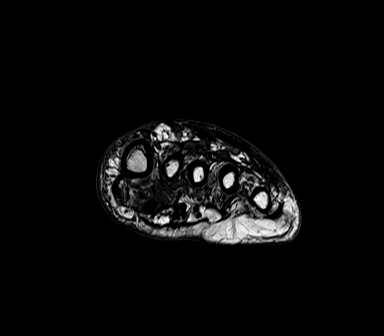
[im 25/40]
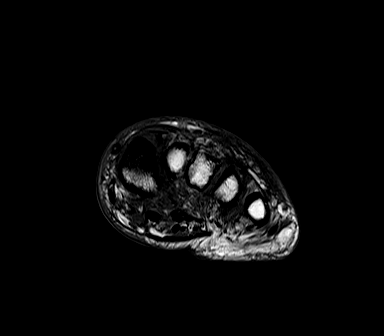
[im 30/40]
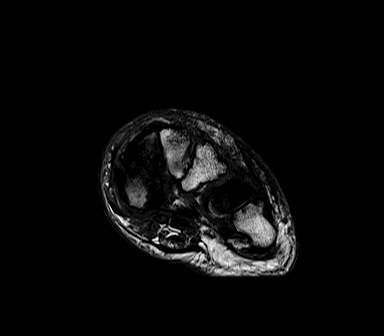
[im 35/40]
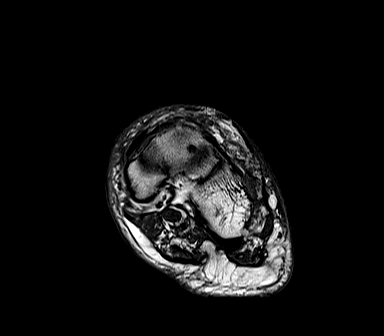
[im 40/40]
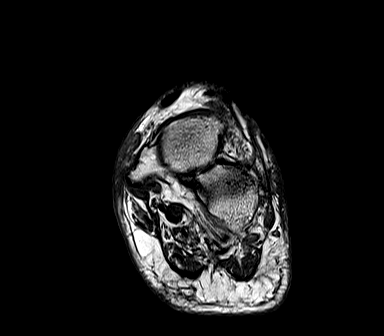

[Series 4: T2 fat-sat · coronal · left · 3.3mm · 0.49mm/px · 8 of 40 slices shown (1 of 2)]
[im 1/40]
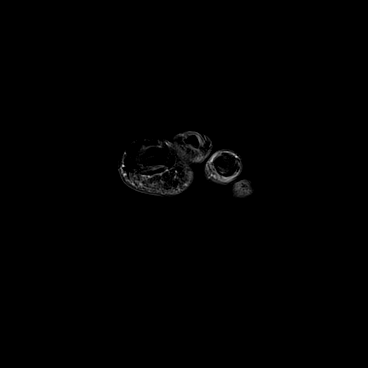
[im 6/40]
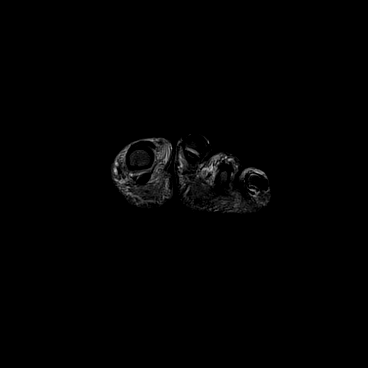
[im 12/40]
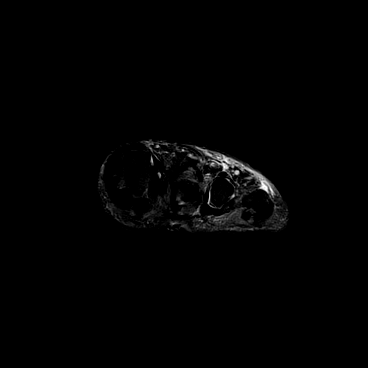
[im 17/40]
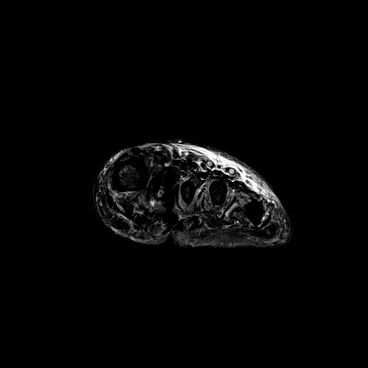
[im 23/40]
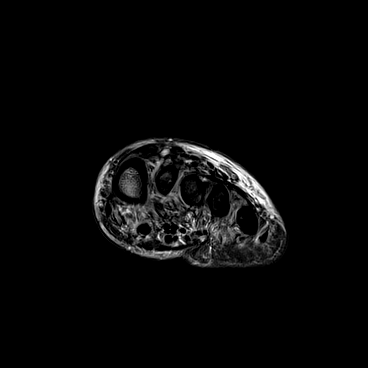
[im 28/40]
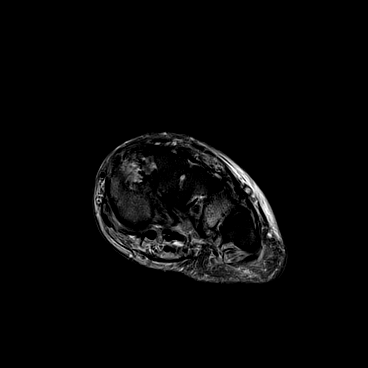
[im 34/40]
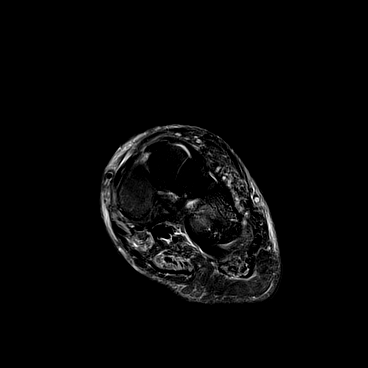
[im 40/40]
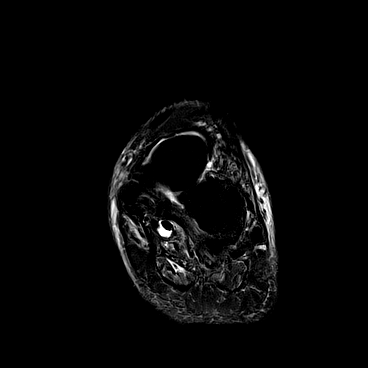

[Series 5: T1 · axial · left · 3.0mm · 0.55mm/px · z∈[-131,+23]mm · 8 of 48 slices shown (2 of 2)]
[im 1/48]
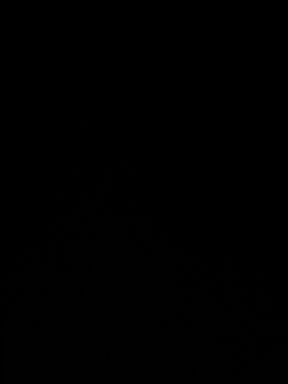
[im 6/48]
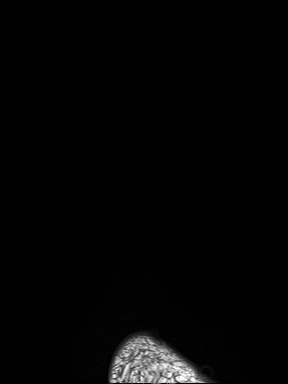
[im 16/48]
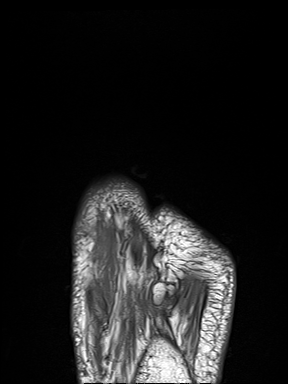
[im 21/48]
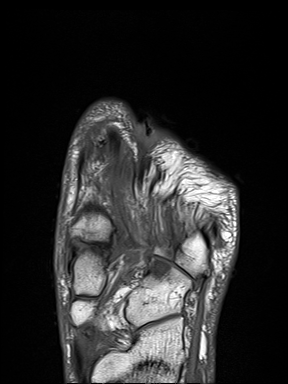
[im 27/48]
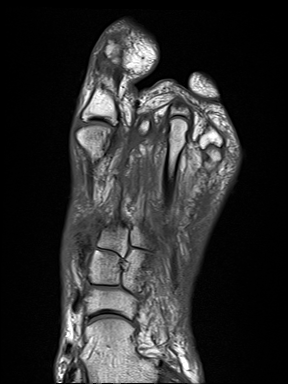
[im 32/48]
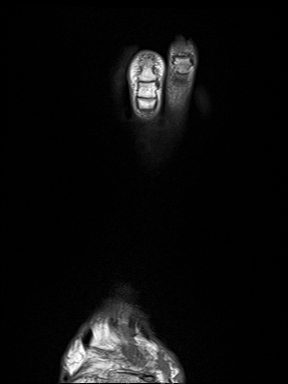
[im 42/48]
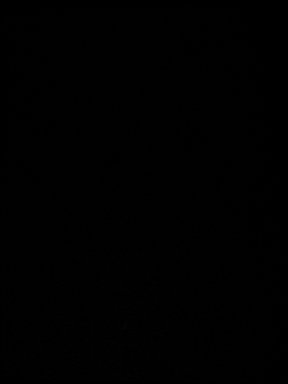
[im 48/48]
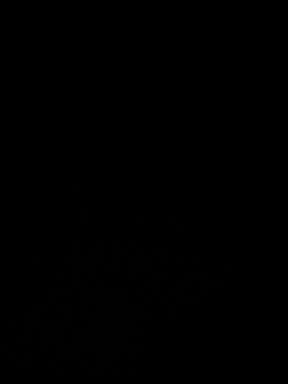

[Series 8: T2 fat-sat · axial · left · 3.3mm · 0.60mm/px · z∈[-104,+20]mm · 7 of 34 slices shown (2 of 2)]
[im 1/34]
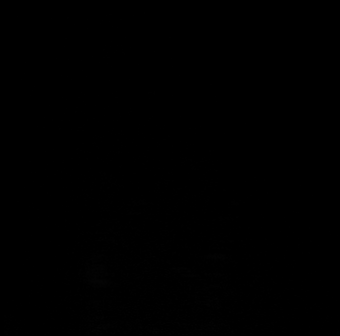
[im 6/34]
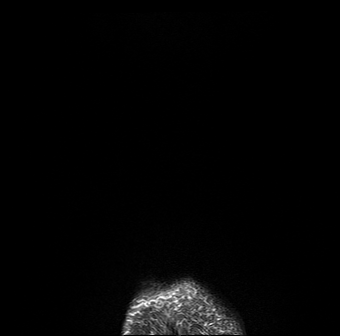
[im 12/34]
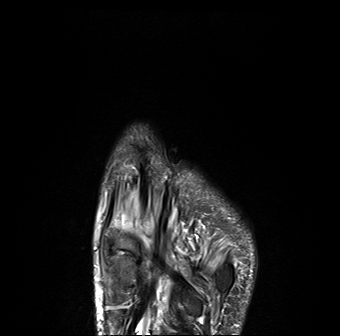
[im 17/34]
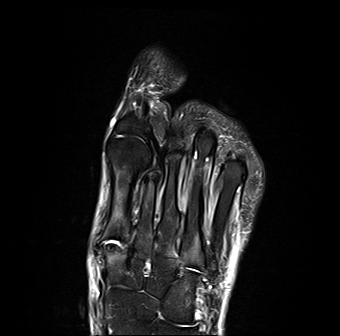
[im 23/34]
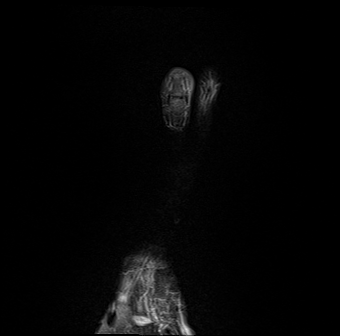
[im 28/34]
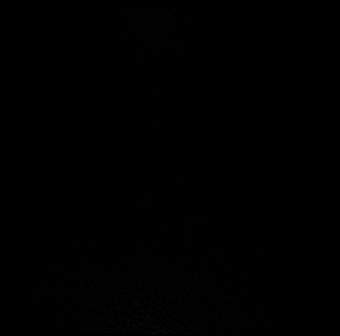
[im 34/34]
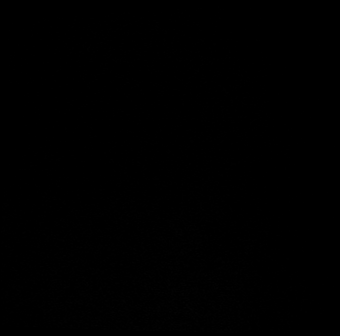

[Series 9: STIR · sagittal · left · 3.0mm · 0.82mm/px · 6 of 31 slices shown]
[im 1/31]
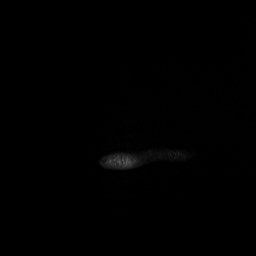
[im 7/31]
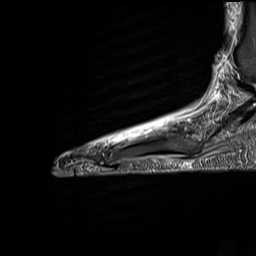
[im 13/31]
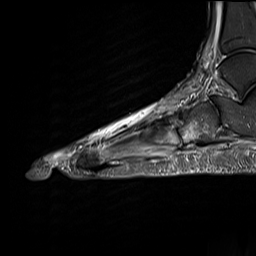
[im 19/31]
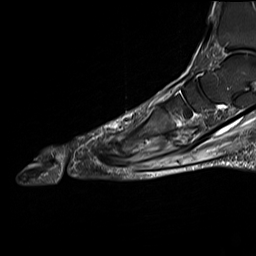
[im 25/31]
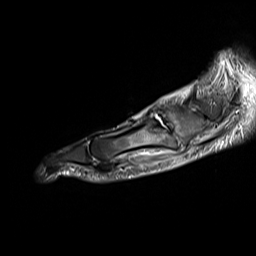
[im 31/31]
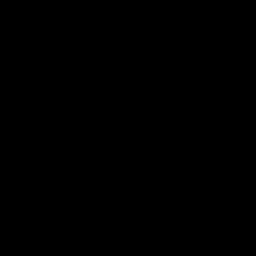

[38 of 40 positions shown; findings below may reference images not displayed]

FINDINGS: Bones/Joint/Cartilage

Status post second ray resection to the level of the second
metatarsal neck. There is also been a osteotomy of the third
metatarsal head. Bone marrow edema and intermediate-low T1 signal
changes within the distal second metatarsal at the resection site
concerning for osteomyelitis. Marrow edema at the base of the third
toe proximal phalanx, also with intermediate T1 signal is suspicious
for osteomyelitis. Minimal marrow edema within the distal aspect of
the third metatarsal with preservation of the T1 marrow signal.

First TMT joint effusion with prominent bone marrow edema and low T1
signal changes within the first metatarsal base extending to the
level of the proximal diaphysis as well as within the distal aspect
of the medial cuneiform. Subchondral cysts or erosions along both
sides of joint. Findings are concerning for septic arthritis with
osteomyelitis. Similar findings are present at the fourth
tarsometatarsal joint with suspicion of septic arthritis and
osteomyelitis.

No acute fracture. No dislocation. Degenerative changes are present
within the forefoot.

Ligaments

Lisfranc ligament appears grossly intact. No evidence of acute
collateral ligament injury.

Muscles and Tendons

Signal changes within the foot musculature suggest a combination of
chronic denervation and myositis. Post amputation changes within the
second toe tendons. No large tenosynovial fluid collection.

Soft tissues

Ulceration at the plantar aspect of the foot underlying the level of
the third metatarsal head. Diffuse soft tissue edema. Focal 5 mm
fluid collection underlying the plantar aspect of the residual
distal second metatarsal may reflect a small abscess (series 4,
image 23).
IMPRESSION: 1. Ulceration at the plantar aspect of the foot underlying the level
of the third metatarsal head. Evidence of osteomyelitis involving
the base of the third toe proximal phalanx.
2. Signal changes within the distal second metatarsal at the
resection site are also concerning for osteomyelitis.
3. First TMT joint effusion with prominent bone marrow edema within
the first metatarsal base extending to the level of the proximal
diaphysis as well as within the distal aspect of the medial
cuneiform. Although this could be degenerative in etiology, findings
are concerning for septic arthritis with osteomyelitis.
4. Similar findings are present at the fourth tarsometatarsal joint
where there is also suspicion for septic arthritis and
osteomyelitis.
5. Focal 5 mm fluid collection underlying the plantar aspect of the
residual distal second metatarsal may reflect a small abscess.
6. Signal changes within the foot musculature suggest a combination
of chronic denervation and myositis.

These results will be called to the ordering clinician or
representative by the Radiologist Assistant, and communication
documented in the PACS or [REDACTED].

## 2021-07-23 IMAGING — DX DG FOOT COMPLETE 3+V*L*
3 series · 3 of 3 positions shown · non-contrast
Comparison: Foot radiograph [DATE]

CLINICAL DATA: Diabetic foot ulcers.

EXAM:
LEFT FOOT - COMPLETE 3+ VIEW

[foot ap]
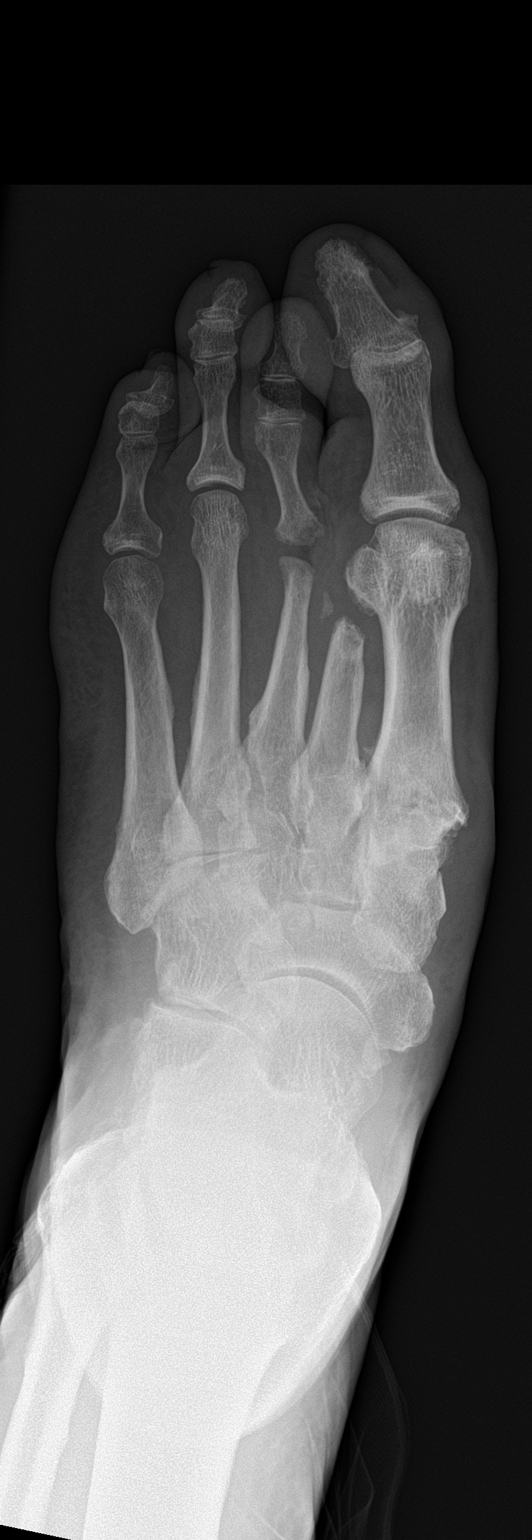

[foot obl]
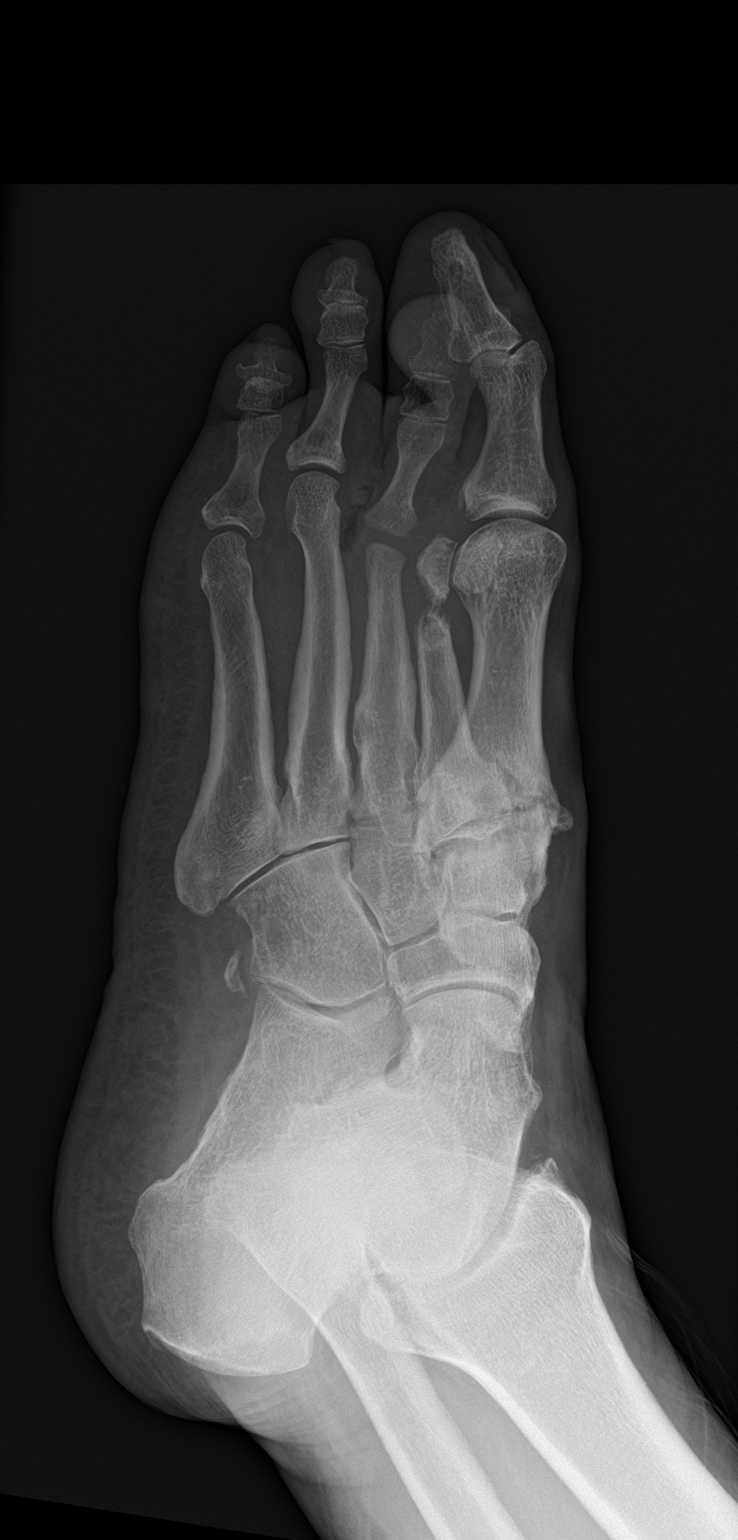

[foot lat]
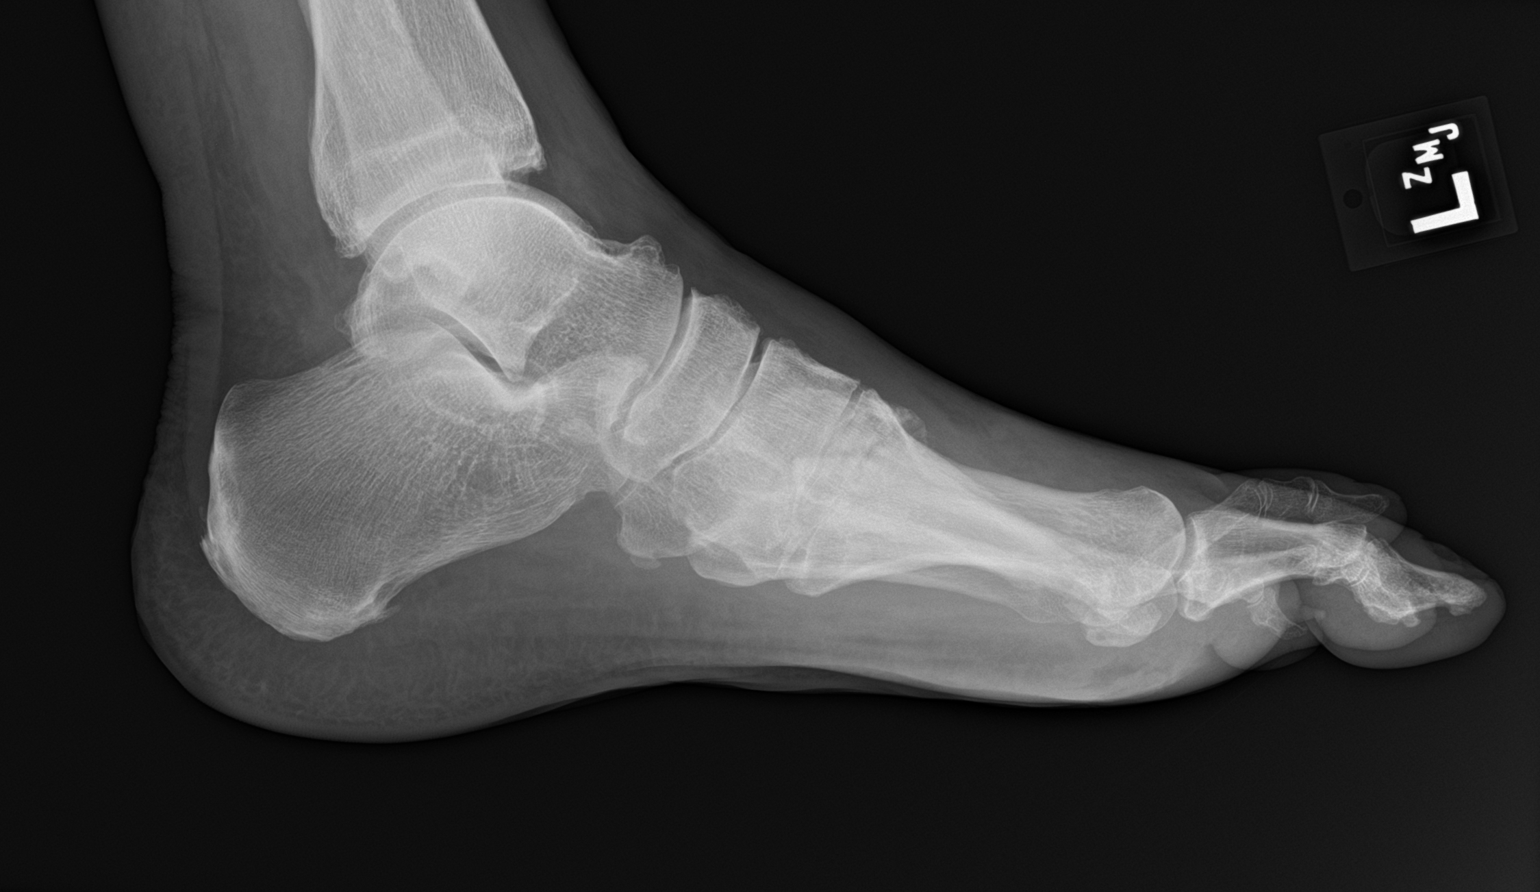

[3 of 3 positions shown; findings below may reference images not displayed]

FINDINGS: Osteotomy of the second ray unchanged from prior. There is new loss
of the third ray metacarpal head which may be postsurgical. There is
rarefaction of the bone at the base of the proximal phalanx third
digit along the medial border.

Plantar ulceration the level of the metatarsal heads seen on lateral
projection. No foreign body identified
IMPRESSION: 1. Concern for osteomyelitis at the base of the proximal phalanx
third digit.
2. Probable osteotomy of the third ray RIGHT metatarsal. Prior
second ray osteotomy.
3. Soft tissue ulceration along the plantar surface of the
metatarsal head row. No foreign body.

## 2021-07-23 IMAGING — DX DG FOOT COMPLETE 3+V*R*
3 series · 3 of 3 positions shown · non-contrast
Comparison: None.

CLINICAL DATA: Diabetic foot ulcer.  Evaluation for osteomyelitis

EXAM:
RIGHT FOOT COMPLETE - 3+ VIEW

[foot ap]
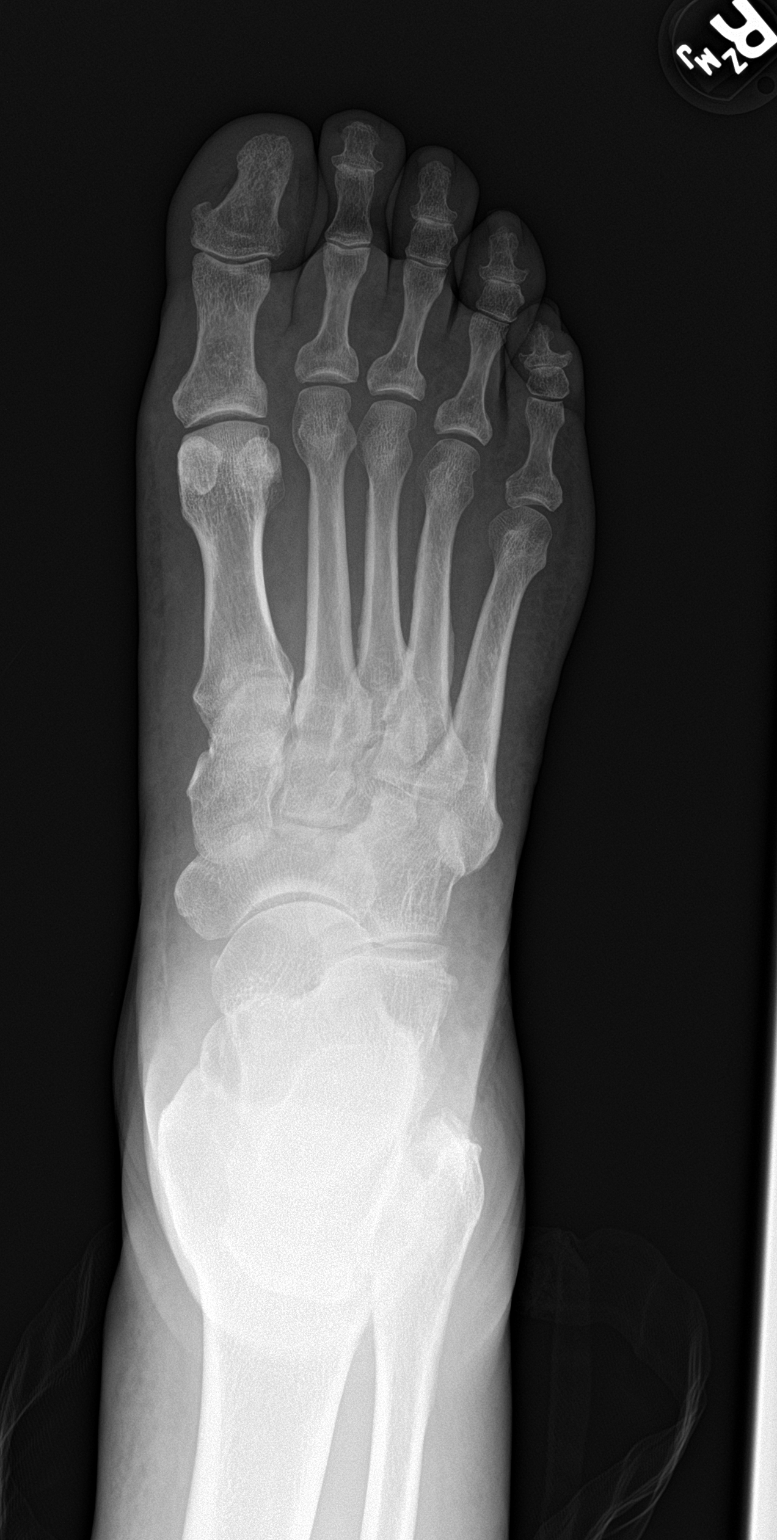

[foot obl]
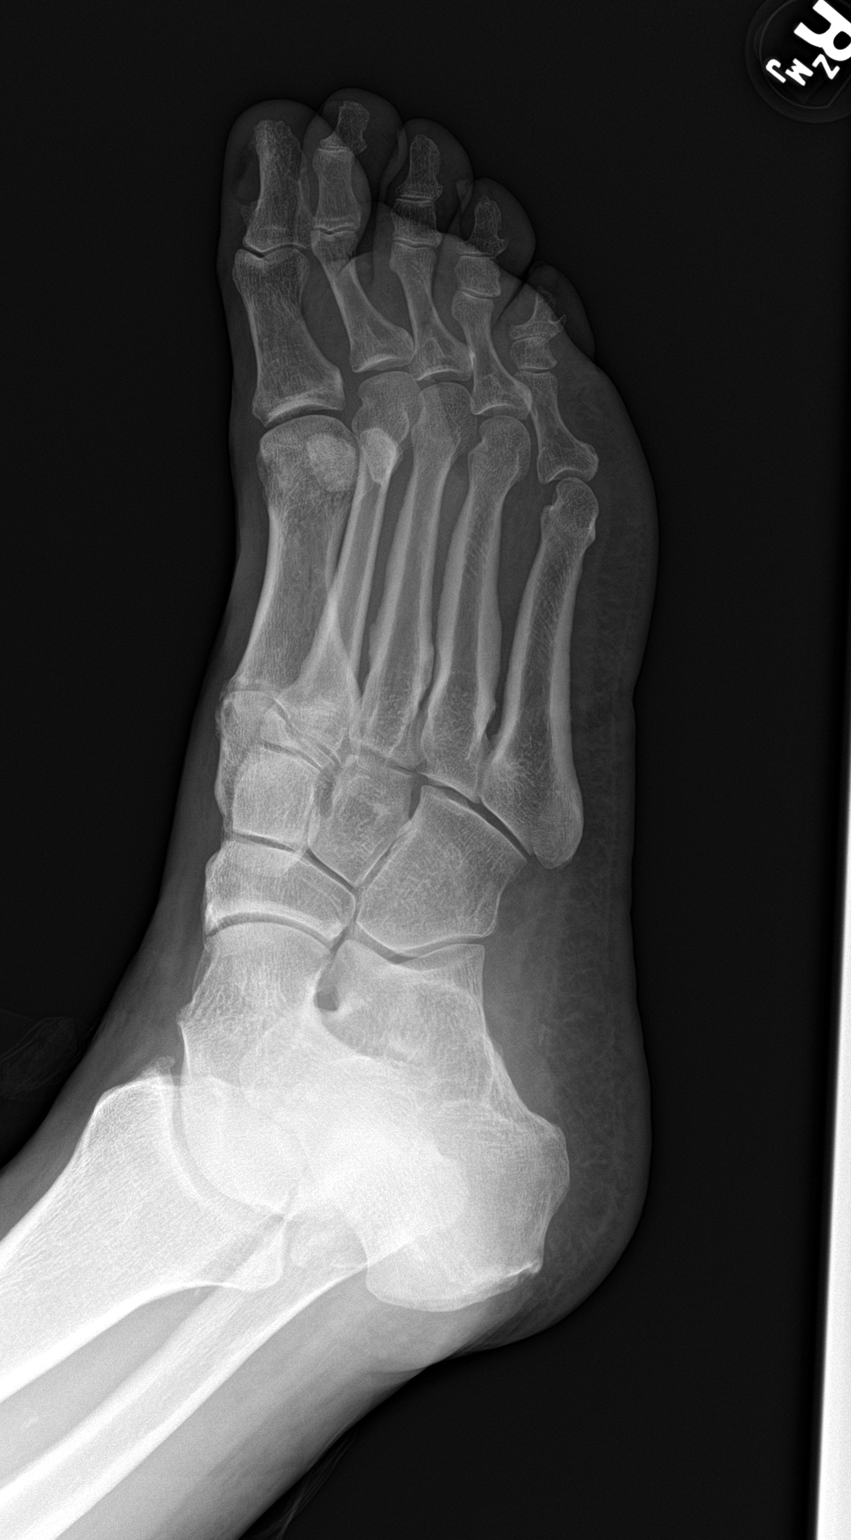

[foot lat]
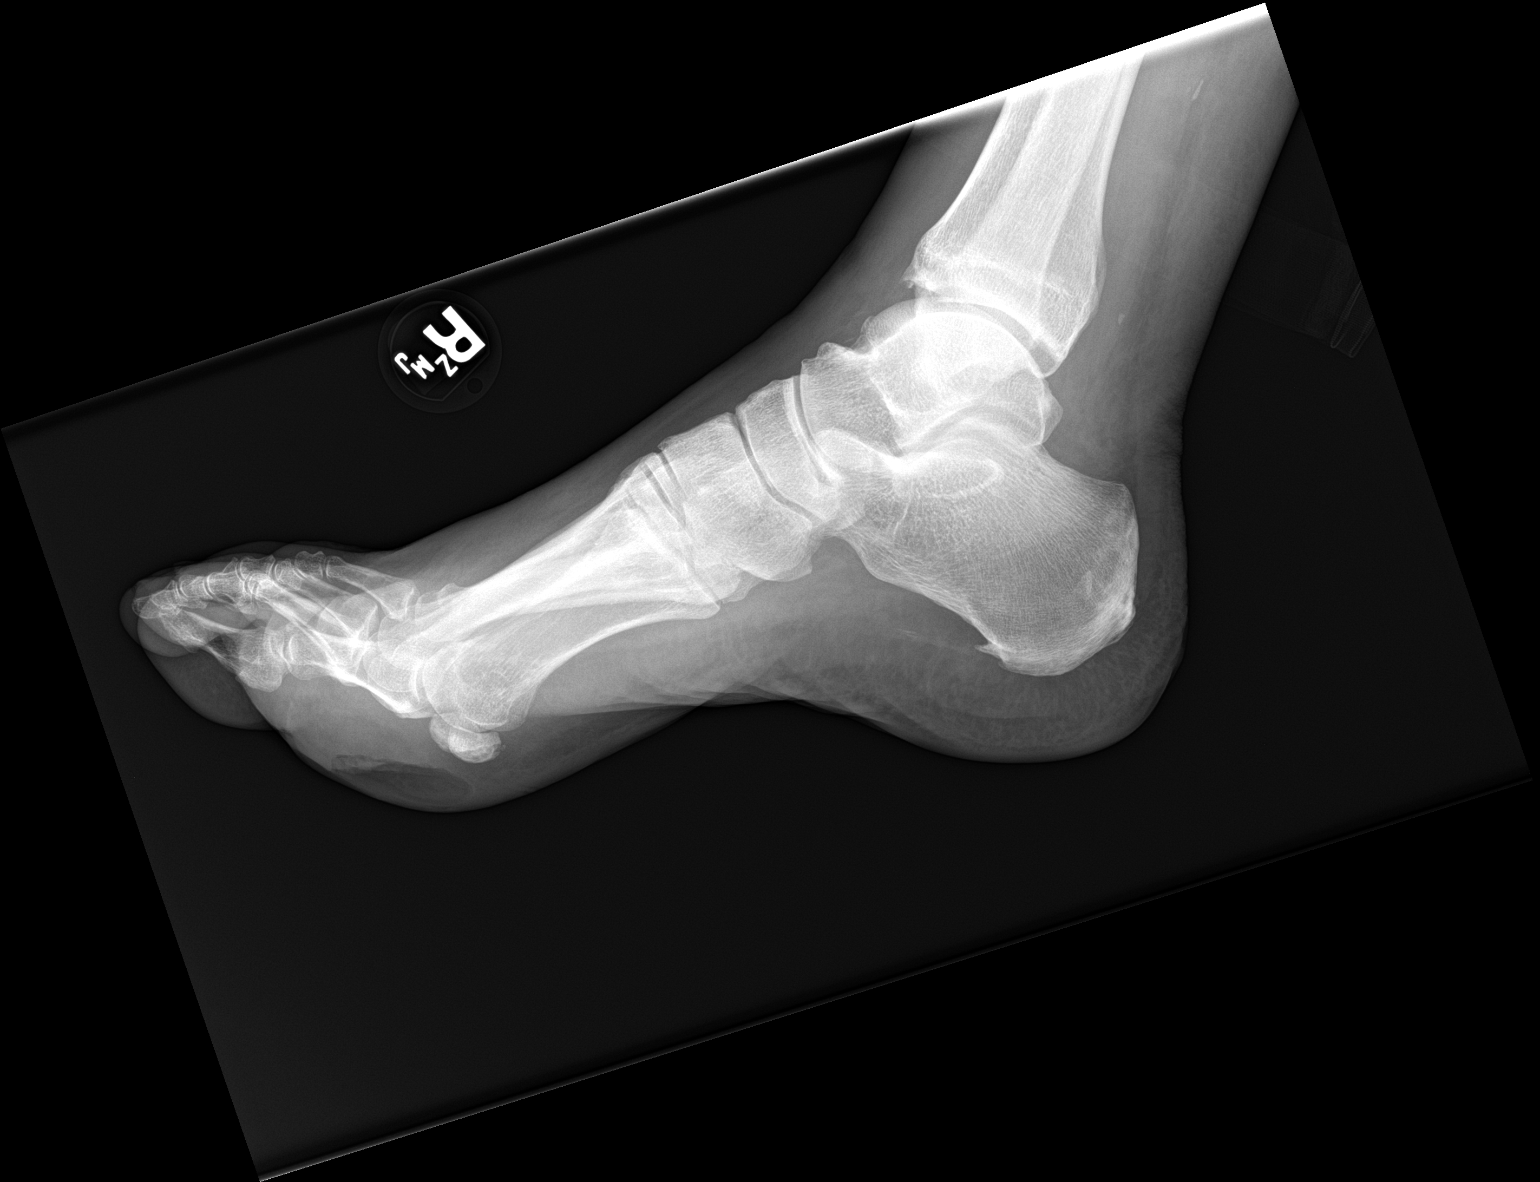

[3 of 3 positions shown; findings below may reference images not displayed]

FINDINGS: No cortical erosion or periosteal reaction concerning for
osteomyelitis. Prominent plantar calcaneal spurring. No acute
fracture or dislocation.

Deep skin wound about the plantar aspect of the distal foot.
IMPRESSION: No radiographic evidence of osteomyelitis. If there is a deep skin
wound and clinical concern for osteomyelitis, MRI examination could
be obtained for further evaluation.

## 2021-07-23 MED ORDER — OXYCODONE-ACETAMINOPHEN 10-325 MG PO TABS: 1.0000 | ORAL_TABLET | ORAL | Status: DC | PRN

## 2021-07-23 MED ORDER — SENNOSIDES-DOCUSATE SODIUM 8.6-50 MG PO TABS: 1.0000 | ORAL_TABLET | Freq: Every evening | ORAL | Status: DC | PRN

## 2021-07-23 MED ORDER — SODIUM CHLORIDE 0.9% FLUSH
3.0000 mL | Freq: Two times a day (BID) | INTRAVENOUS | Status: DC
  Administered 2021-07-23 – 2021-07-26 (×5): 3 mL via INTRAVENOUS

## 2021-07-23 MED ORDER — NICOTINE 21 MG/24HR TD PT24
21.0000 mg | MEDICATED_PATCH | Freq: Every day | TRANSDERMAL | Status: DC
  Administered 2021-07-24 – 2021-07-26 (×3): 21 mg via TRANSDERMAL
  Filled 2021-07-23 (×3): qty 1

## 2021-07-23 MED ORDER — ACETAMINOPHEN 650 MG RE SUPP: 650.0000 mg | Freq: Four times a day (QID) | RECTAL | Status: DC | PRN

## 2021-07-23 MED ORDER — OXYCODONE HCL 5 MG PO TABS
5.0000 mg | ORAL_TABLET | ORAL | Status: DC | PRN
  Administered 2021-07-24 – 2021-07-26 (×9): 5 mg via ORAL
  Filled 2021-07-23 (×9): qty 1

## 2021-07-23 MED ORDER — NICOTINE 21 MG/24HR TD PT24
21.0000 mg | MEDICATED_PATCH | Freq: Once | TRANSDERMAL | Status: DC
  Administered 2021-07-23: 21 mg via TRANSDERMAL
  Filled 2021-07-23: qty 1

## 2021-07-23 MED ORDER — SODIUM CHLORIDE 0.9% FLUSH: 3.0000 mL | INTRAVENOUS | Status: DC | PRN

## 2021-07-23 MED ORDER — MORPHINE SULFATE (PF) 2 MG/ML IV SOLN
1.0000 mg | INTRAVENOUS | Status: DC | PRN
  Administered 2021-07-23 – 2021-07-26 (×3): 1 mg via INTRAVENOUS
  Filled 2021-07-23 (×3): qty 1

## 2021-07-23 MED ORDER — SODIUM CHLORIDE 0.9 % IV SOLN: 250.0000 mL | INTRAVENOUS | Status: DC | PRN

## 2021-07-23 MED ORDER — CLONAZEPAM 0.5 MG PO TABS: 0.5000 mg | ORAL_TABLET | Freq: Every day | ORAL | Status: DC | PRN

## 2021-07-23 MED ORDER — PIPERACILLIN-TAZOBACTAM 3.375 G IVPB 30 MIN
3.3750 g | Freq: Once | INTRAVENOUS | Status: AC
  Administered 2021-07-23: 3.375 g via INTRAVENOUS
  Filled 2021-07-23: qty 50

## 2021-07-23 MED ORDER — INSULIN GLARGINE-YFGN 100 UNIT/ML ~~LOC~~ SOLN
10.0000 [IU] | Freq: Every day | SUBCUTANEOUS | Status: DC
  Administered 2021-07-24: 10 [IU] via SUBCUTANEOUS
  Filled 2021-07-23 (×4): qty 0.1

## 2021-07-23 MED ORDER — GABAPENTIN 600 MG PO TABS
600.0000 mg | ORAL_TABLET | Freq: Three times a day (TID) | ORAL | Status: DC
  Administered 2021-07-23 – 2021-07-26 (×8): 600 mg via ORAL
  Filled 2021-07-23 (×9): qty 1

## 2021-07-23 MED ORDER — CITALOPRAM HYDROBROMIDE 40 MG PO TABS
40.0000 mg | ORAL_TABLET | Freq: Every day | ORAL | Status: DC
  Administered 2021-07-23 – 2021-07-26 (×4): 40 mg via ORAL
  Filled 2021-07-23: qty 4
  Filled 2021-07-23 (×3): qty 1

## 2021-07-23 MED ORDER — PIPERACILLIN-TAZOBACTAM 3.375 G IVPB
3.3750 g | Freq: Three times a day (TID) | INTRAVENOUS | Status: DC
  Administered 2021-07-23 – 2021-07-26 (×8): 3.375 g via INTRAVENOUS
  Filled 2021-07-23 (×9): qty 50

## 2021-07-23 MED ORDER — INSULIN ASPART 100 UNIT/ML IJ SOLN
0.0000 [IU] | Freq: Three times a day (TID) | INTRAMUSCULAR | Status: DC
Start: 2021-07-24 — End: 2021-07-26
  Administered 2021-07-24: 3 [IU] via SUBCUTANEOUS
  Administered 2021-07-24: 5 [IU] via SUBCUTANEOUS
  Administered 2021-07-24 – 2021-07-25 (×2): 3 [IU] via SUBCUTANEOUS
  Administered 2021-07-25 – 2021-07-26 (×4): 5 [IU] via SUBCUTANEOUS

## 2021-07-23 MED ORDER — VANCOMYCIN HCL 2000 MG/400ML IV SOLN
2000.0000 mg | Freq: Once | INTRAVENOUS | Status: AC
  Administered 2021-07-23: 2000 mg via INTRAVENOUS
  Filled 2021-07-23: qty 400

## 2021-07-23 MED ORDER — ACETAMINOPHEN 325 MG PO TABS: 650.0000 mg | ORAL_TABLET | Freq: Four times a day (QID) | ORAL | Status: DC | PRN

## 2021-07-23 MED ORDER — OXYCODONE-ACETAMINOPHEN 5-325 MG PO TABS
1.0000 | ORAL_TABLET | ORAL | Status: DC | PRN
  Administered 2021-07-24 – 2021-07-26 (×10): 1 via ORAL
  Filled 2021-07-23 (×10): qty 1

## 2021-07-23 MED ORDER — ATORVASTATIN CALCIUM 80 MG PO TABS
80.0000 mg | ORAL_TABLET | Freq: Every day | ORAL | Status: DC
  Administered 2021-07-23 – 2021-07-26 (×4): 80 mg via ORAL
  Filled 2021-07-23: qty 1
  Filled 2021-07-23: qty 2
  Filled 2021-07-23 (×2): qty 1

## 2021-07-23 MED ORDER — CLOPIDOGREL BISULFATE 75 MG PO TABS
75.0000 mg | ORAL_TABLET | Freq: Every day | ORAL | Status: DC
  Administered 2021-07-23 – 2021-07-26 (×4): 75 mg via ORAL
  Filled 2021-07-23 (×4): qty 1

## 2021-07-23 MED ORDER — VANCOMYCIN HCL 1250 MG/250ML IV SOLN
1250.0000 mg | Freq: Two times a day (BID) | INTRAVENOUS | Status: DC
  Administered 2021-07-24 – 2021-07-25 (×4): 1250 mg via INTRAVENOUS
  Filled 2021-07-23 (×7): qty 250

## 2021-07-23 MED ORDER — ADULT MULTIVITAMIN W/MINERALS CH
1.0000 | ORAL_TABLET | Freq: Every day | ORAL | Status: DC
  Administered 2021-07-24 – 2021-07-26 (×3): 1 via ORAL
  Filled 2021-07-23 (×3): qty 1

## 2021-07-23 NOTE — ED Triage Notes (Signed)
C/o diabetic foot ulcers to bilateral feet for 2-3 weeks.  States he feels like he may have had a fever.

## 2021-07-23 NOTE — H&P (Addendum)
History and Physical    Cody Pena JXB:[id number] DOB: May 04, 1961 DOA: 07/23/2021  PCP: Aida Puffer, MD Consultants:  none  Patient coming from:  Home - lives with wife, daughter and grandson   Chief Complaint: bilateral foot wounds/pain     HPI: Cody Pena is a 60 y.o. 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 He was hoping he could treat it at home with prism (from wound care center), "silver stuff." He states he think he stepped on something and had blisters on the bottom of both of his feet that just progressed. The pain started to progress and the color changed, his whole right foot turned red and swollen and he had increased foul smelling drainage from both feet, but the right one is much worse. He denies any fever or chills at home, but has felt bad.   He deneis any headaches, vision changes, chest pain or palpitations, shortness of breath or cough, stomach pain, N/V/D, dysuria or urinary frequency. He has increased leg swelling on the right leg.   He has uncontrolled diabetes and smokes.   ED Course: vitals: temperature: 99.2, bp: 143/77, HR: 92, RR: 16, oxygen: 96% RA Pertinent labs: wbc: 10.6, sodium: 132, glucose: 338, lactic acid: 1.2, covid and flu negative.  Right foot xray: no evidence of osteomyelitis. Deep skin wound about the plantar aspect of distal foot Left foot xray: concern for osteomyelitis at base of the proximal phalanx third digit. In ED; vanc and zosyn started and [facility] was asked to admit.   Review of Systems: As per HPI; otherwise review of systems reviewed and negative.   Ambulatory Status:  Ambulates without assistance    Past Medical History:  Diagnosis Date   Diabetes mellitus without  complication (HCC)    Hypertension     Past Surgical History:  Procedure Laterality Date   AMPUTATION Left 11/15/2017   Procedure: LEFT FOOT 2ND RAY AMPUTATION;  Surgeon: Nadara Mustard, MD;  Location: [facility];  Service: Orthopedics;  Laterality: Left;   CERVICAL FUSION     IR ANGIOGRAM EXTREMITY LEFT  11/14/2017   IR ANGIOGRAM SELECTIVE EACH ADDITIONAL VESSEL  11/14/2017   IR ANGIOGRAM SELECTIVE EACH ADDITIONAL VESSEL  11/14/2017   IR TIB-PERO ART PTA MOD SED  11/14/2017   IR TIB-PERO ART UNI PTA EA ADD VESSEL MOD SED  11/14/2017   IR US GUIDE VASC ACCESS LEFT  11/14/2017    Social History   Socioeconomic History   Marital status: Married    Spouse name: Not on file   Number of children: Not on file   Years of education: Not on file   Highest education level: Not on file  Occupational History   Not on file  Tobacco Use   Smoking status: Every Day   Smokeless tobacco: Never  Substance and Sexual Activity   Alcohol use: No   Drug use: No   Sexual activity: Not on file  Other Topics Concern   Not on file  Social History Narrative   Not on file   Social Determinants of Health   Financial Resource Strain: Not on file  Food Insecurity: Not on file  Transportation Needs: Not on file  Physical Activity: Not on file  Stress: Not on file  Social Connections:  Not on file  Intimate Partner Violence: Not on file    No Known Allergies  No family history on file.  Prior to Admission medications   Medication Sig Start Date End Date Taking? Authorizing Provider  oxyCODONE (OXY IR/ROXICODONE) 5 MG immediate release tablet Take 1-2 tablets (5-10 mg total) by mouth 2 (two) times daily. 11/16/17  Yes Hall, Carole N, DO  atorvastatin (LIPITOR) 80 MG tablet Take 80 mg by mouth daily.    [provider]  citalopram (CELEXA) 40 MG tablet Take 40 mg by mouth daily.    [provider]  Dulaglutide (TRULICITY [place]) Inject 1 Device into the skin every 7 (seven) days.     [provider]  feeding supplement, ENSURE ENLIVE, (ENSURE ENLIVE) LIQD Take 237 mLs by mouth 2 (two) times daily between meals. 11/16/17   Darlin Drop, DO  gabapentin (NEURONTIN) 600 MG tablet Take 600 mg by mouth daily.     [provider]  HYDROcodone-acetaminophen (NORCO) 10-325 MG per tablet Take 1 tablet by mouth every 4 (four) hours as needed for moderate pain.     [provider]  Insulin Glargine (BASAGLAR KWIKPEN [place]) Inject 80 Units into the skin at bedtime.     [provider]  irbesartan (AVAPRO) 150 MG tablet Take 150 mg by mouth daily.    [provider]  Multiple Vitamin (MULTIVITAMIN WITH MINERALS) TABS tablet Take 1 tablet by mouth daily. 11/17/17   Darlin Drop, DO  nicotine (NICODERM CQ - DOSED IN MG/24 HOURS) 21 mg/24hr patch Place 1 patch (21 mg total) onto the skin daily. 11/17/17   Darlin Drop, DO  pentoxifylline (TRENTAL) 400 MG CR tablet Take 1 tablet (400 mg total) by mouth 3 (three) times daily with meals. 12/11/17   Nadara Mustard, MD  polyethylene glycol [facility] / Ethelene Hal) packet Take 17 g by mouth daily. 11/17/17   Darlin Drop, DO  Vitamin D, Ergocalciferol, (DRISDOL) 50000 units CAPS capsule Take 50,000 Units by mouth every 7 (seven) days.    [provider]    Physical Exam: Vitals:   07/23/21 1419 07/23/21 1600 07/23/21 1630  BP: (!) 143/77 (!) 111/59 110/71  Pulse: 92 72 72  Resp: 16  20  Temp: 99.2 F (37.3 C)    TempSrc: Oral    SpO2: 96% 95% 94%     General:  Appears calm and comfortable and is in NAD Eyes:  PERRL, EOMI, normal lids, iris ENT:  grossly normal hearing, lips & tongue, mmm; poor dentition Neck:  no LAD, masses or thyromegaly; no carotid bruits Cardiovascular:  RRR, no m/r/g. Right LE edema to above the calf.   Respiratory:   CTA bilaterally with no wheezes/rales/rhonchi.  Normal respiratory effort. Abdomen:  soft, NT, ND, NABS Back:   normal alignment, no CVAT Skin:  bilateral plantar  ulcers. Right open wound with foul smelling drainage. Can not probe. Left wound with no drainage and can probe >27mm deep.   Right foot   Left foot  Musculoskeletal:  grossly normal tone BUE/BLE, good ROM, no bony abnormality Lower extremity:  foot exam per above.  2+ PT pulses, thready DP pulses bilaterally.  Psychiatric:  grossly normal mood and affect, speech fluent and appropriate, AOx3 Neurologic:  CN 2-12 grossly intact, moves all extremities in coordinated fashion, sensation intact    Radiological Exams on Admission: Independently reviewed - see discussion in A/P where applicable  DG Foot Complete Left  Result Date:  07/23/2021 CLINICAL DATA:  Diabetic foot ulcers. EXAM: LEFT FOOT - COMPLETE 3+ VIEW COMPARISON:  Foot radiograph 08/07/2018 FINDINGS: Osteotomy of the second ray unchanged from prior. There is new loss of the third ray metacarpal head which may be postsurgical. There is rarefaction of the bone at the base of the proximal phalanx third digit along the medial border. Plantar ulceration the level of the metatarsal heads seen on lateral projection. No foreign body identified IMPRESSION: 1. Concern for osteomyelitis at the base of the proximal phalanx third digit. 2. Probable osteotomy of the third ray RIGHT metatarsal. Prior second ray osteotomy. 3. Soft tissue ulceration along the plantar surface of the metatarsal head row. No foreign body. Electronically Signed   By: Genevive Bi M.D.   On: 07/23/2021 15:10   DG Foot Complete Right  Result Date: 07/23/2021 CLINICAL DATA:  Diabetic foot ulcer.  Evaluation for osteomyelitis EXAM: RIGHT FOOT COMPLETE - 3+ VIEW COMPARISON:  None. FINDINGS: No cortical erosion or periosteal reaction concerning for osteomyelitis. Prominent plantar calcaneal spurring. No acute fracture or dislocation. Deep skin wound about the plantar aspect of the distal foot. IMPRESSION: No radiographic evidence of osteomyelitis. If there is a deep skin wound  and clinical concern for osteomyelitis, MRI examination could be obtained for further evaluation. Electronically Signed   By: Larose Hires D.O.   On: 07/23/2021 15:04     Labs on Admission: I have personally reviewed the available labs and imaging studies at the time of the admission.  Pertinent labs:  wbc: 10.6,  sodium: 132> corrects to 136 with glucose  glucose: 338,  lactic acid: 1.2,  covid and flu negative.   Assessment/Plan Active Problems: Bilateral diabetic foot ulcers with concern for osteomyelitis on left foot -60 year old male with type 2 diabetes, likely poorly controlled off all medication presenting to ED with 2 week history of bilateral plantar ulcers with worsening pain, redness and foul smelling drainage -place in obs on med-surg  -no criteria for sepsis  -concern for osteo of left foot off xray findings/clinical exam  -MRI left foot, will defer further imaging of right foot to ortho -ortho consulted ([facility]/Dr. Lajoyce Corners)  -continue vanc/zosyn per pharmacy -cultures taken in ED -check DVT doppler right leg due to LE edema -may need ABI, likely poor diabetic control, a1c pending  -pain control with home oxycodone QID and morphine for breakthrough pain -wound care consult     Type 2 DM with diabetic peripheral angiopathy w/o gangrene (HCC) -a1c has not been checked in years per patient -was supposed to be on trulicity and basaglar, but has not been taking these for sometime -glucose elevated in setting of foot infection, need much better sugar control  -start long acting insulin at 10 units daily and moderate SSI with routine accuchecks -A1C pending     HTN (hypertension) Has been well controlled, off medication for some time. Stopped on his own accord Continue to follow   PVD s/p IR atherectomy with PTA and stent -continue plavix/statin  -? ABI     Hyperlipidemia Has not seen his doctor in a long time due to virtual visits Fasting lipid panel in  AM Continue lipitor     Mood disorder (HCC) Stable, continue celexa     Cigarette smoker Continue nicotine patch, encouraged cessation    Diabetic peripheral neuropathy (HCC)  Continue gabapentin, 600mg  TID    There is no height or weight on file to calculate BMI.   Level of care: Med-Surg DVT prophylaxis:  TED hose Code Status:  Full - confirmed with patient Family Communication: None present Disposition Plan:  The patient is from: home  Anticipated d/c is to: home    Patient placed in observation as anticipate less than 2 midnight stay. Requires hospitalization for foot ulcers for IV abx, further imaging and possibly surgical managements/MDM with specialists.    Patient is currently: stable   Consults called: ortho, [facility]: [employer], PA.   Admission status:  observation    Orland Mustard MD [facility]   How to contact the [facility] Attending or Consulting provider 7A - 7P or covering provider during after hours 7P -7A, for this patient?  Check the care team in [facility] and look for a) attending/consulting [facility] provider listed and b) the [facility] team listed Log into [email or web address] and use [place]'s universal password to access. If you do not have the password, please contact the hospital operator. Locate the [facility] provider you are looking for under [facility] and page to a number that you can be directly reached. If you still have difficulty reaching the provider, please page the [facility] (Director on Call) for the Hospitalists listed on amion for assistance.   07/23/2021, 7:04 PM

## 2021-07-23 NOTE — ED Notes (Signed)
Pt to MRI

## 2021-07-23 NOTE — ED Provider Notes (Signed)
[facility] EMERGENCY DEPARTMENT Provider Note   CSN: [id number] Arrival date & time: 07/23/21  1409     History Chief Complaint  Patient presents with   Foot Ulcer    Cody Pena is a 60 y.o. male with a history of hypertension, diabetes, foot osteomyelitis, second ray amputation.  Presents to the emergency department with a chief complaint of wounds to bilateral soles.  Patient reports that wounds have been there for at least 2 weeks.  Patient reports that he stepped on a sharp object with the sole of his right foot and since then has had a wound there.  Reports that wound initially started as a blister which then opened.  Patient endorses serosanguineous discharge and foul smell associated with right foot wound.  Patient states that wound to sole of left foot has occurred along previous surgical scar.  Denies any purulent discharge from left foot wound.  Patient complains of pain associated with his wounds.  Patient rates pain 8/10 the pain scale.  Pain improves with his prescribed oxycodone.  Pain is worse with pressure. Patient endorses subjective fever and erythema.  Denies any chills, nausea, vomiting, URI symptoms.  Patient reports that he has not had any of his medications for diabetes over the last week due to trouble obtaining this from his primary care physician.  HPI     Past Medical History:  Diagnosis Date   Diabetes mellitus without complication ([place])    Hypertension     Patient Active Problem List   Diagnosis Date Noted   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 ([place]) 01/14/2018   Ulcer of left foot, limited to breakdown of skin ([place]) 01/14/2018   Foot osteomyelitis, left ([place])    Septic arthritis of foot ([place]) 11/11/2017   Osteomyelitis of ankle or foot, acute, left ([place]) 11/11/2017   Type 2 DM with diabetic peripheral angiopathy w/o gangrene ([place]) 11/11/2017   Cigarette smoker 11/11/2017   Diabetic peripheral  neuropathy ([place]) 11/11/2017   Diabetic foot infection ([place]) 11/10/2017   Post op infection    Delirium 12/07/2014   Cervical vertebral fusion 12/07/2014   Hallucination 12/07/2014   Mood disorder ([place]) 12/07/2014   Pyrexia    Neck swelling     Past Surgical History:  Procedure Laterality Date   AMPUTATION Left 11/15/2017   Procedure: LEFT FOOT 2ND RAY AMPUTATION;  Surgeon: Newt Minion, MD;  Location: [place];  Service: Orthopedics;  Laterality: Left;   CERVICAL FUSION     IR ANGIOGRAM EXTREMITY LEFT  11/14/2017   IR ANGIOGRAM SELECTIVE EACH ADDITIONAL VESSEL  11/14/2017   IR ANGIOGRAM SELECTIVE EACH ADDITIONAL VESSEL  11/14/2017   IR TIB-PERO ART PTA MOD SED  11/14/2017   IR TIB-PERO ART UNI PTA EA ADD VESSEL MOD SED  11/14/2017   IR US GUIDE VASC ACCESS LEFT  11/14/2017       No family history on file.  Social History   Tobacco Use   Smoking status: Every Day   Smokeless tobacco: Never  Substance Use Topics   Alcohol use: No   Drug use: No    Home Medications Prior to Admission medications   Medication Sig Start Date End Date Taking? Authorizing Provider  atorvastatin (LIPITOR) 80 MG tablet Take 80 mg by mouth daily.    [provider]  citalopram (CELEXA) 40 MG tablet Take 40 mg by mouth daily.    [provider]  Dulaglutide (TRULICITY [place]) Inject  1 Device into the skin every 7 (seven) days.     [provider]  feeding supplement, ENSURE ENLIVE, (ENSURE ENLIVE) LIQD Take 237 mLs by mouth 2 (two) times daily between meals. 11/16/17   Kayleen Memos, DO  gabapentin (NEURONTIN) 600 MG tablet Take 600 mg by mouth daily.     [provider]  HYDROcodone-acetaminophen (NORCO) 10-325 MG per tablet Take 1 tablet by mouth every 4 (four) hours as needed for moderate pain.     [provider]  Insulin Glargine (BASAGLAR KWIKPEN [place]) Inject 80 Units into the skin at bedtime.     [provider]  irbesartan (AVAPRO) 150 MG tablet Take 150 mg  by mouth daily.    [provider]  Multiple Vitamin (MULTIVITAMIN WITH MINERALS) TABS tablet Take 1 tablet by mouth daily. 11/17/17   Kayleen Memos, DO  nicotine (NICODERM CQ - DOSED IN MG/24 HOURS) 21 mg/24hr patch Place 1 patch (21 mg total) onto the skin daily. 11/17/17   Kayleen Memos, DO  oxyCODONE (OXY IR/ROXICODONE) 5 MG immediate release tablet Take 1-2 tablets (5-10 mg total) by mouth 2 (two) times daily. 11/16/17   Kayleen Memos, DO  pentoxifylline (TRENTAL) 400 MG CR tablet Take 1 tablet (400 mg total) by mouth 3 (three) times daily with meals. 12/11/17   Newt Minion, MD  polyethylene glycol [facility] / Floria [place]) packet Take 17 g by mouth daily. 11/17/17   Kayleen Memos, DO  Vitamin D, Ergocalciferol, (DRISDOL) 50000 units CAPS capsule Take 50,000 Units by mouth every 7 (seven) days.    [provider]    Allergies    Patient has no known allergies.  Review of Systems   Review of Systems  Constitutional:  Positive for fever (Subjective). Negative for chills.  Eyes:  Negative for visual disturbance.  Respiratory:  Negative for shortness of breath.   Cardiovascular:  Negative for chest pain.  Gastrointestinal:  Negative for abdominal pain, nausea and vomiting.  Genitourinary:  Negative for difficulty urinating, dysuria, hematuria and urgency.  Musculoskeletal:  Negative for back pain and neck pain.  Skin:  Positive for color change and wound. Negative for pallor and rash.  Neurological:  Negative for dizziness, syncope, light-headedness and headaches.  Psychiatric/Behavioral:  Negative for confusion.    Physical Exam Updated Vital Signs BP (!) 143/77   Pulse 92   Temp 99.2 F (37.3 C) (Oral)   Resp 16   SpO2 96%   Physical Exam Vitals and nursing note reviewed.  Constitutional:      General: He is not in acute distress.    Appearance: He is not ill-appearing, toxic-appearing or diaphoretic.  HENT:     Head: Normocephalic.  Eyes:     General: No  scleral icterus.       Right eye: No discharge.        Left eye: No discharge.  Cardiovascular:     Rate and Rhythm: Normal rate.     Pulses:          Dorsalis pedis pulses are detected w/ Doppler on the right side and detected w/ Doppler on the left side.       Posterior tibial pulses are detected w/ Doppler on the right side and detected w/ Doppler on the left side.  Pulmonary:     Effort: Pulmonary effort is normal.  Musculoskeletal:     Right lower leg: Normal.     Left lower leg: Normal.  Right ankle: No swelling, deformity, ecchymosis or lacerations. No tenderness. Normal range of motion.     Left ankle: No swelling, deformity, ecchymosis or lacerations. No tenderness. Normal range of motion.  Feet:     Right foot:     Skin integrity: Ulcer, skin breakdown, erythema, warmth, callus and dry skin present. No blister.     Toenail Condition: Right toenails are abnormally thick.     Left foot:     Skin integrity: Ulcer, skin breakdown, callus and dry skin present. No erythema or warmth.     Toenail Condition: Left toenails are abnormally thick.     Comments: Second ray amputation to left foot.  4 cm ulcer to sole of right foot with serosanguineous discharge and foul odor.  Surrounding erythema and warmth.  2 cm wound to sole of left foot, no discharge, surrounding warmth, or erythema.  Skin:    General: Skin is warm and dry.  Neurological:     General: No focal deficit present.     Mental Status: He is alert.  Psychiatric:        Behavior: Behavior is cooperative.        ED Results / Procedures / Treatments   Labs (all labs ordered are listed, but only abnormal results are displayed) Labs Reviewed  CBC WITH DIFFERENTIAL/PLATELET - Abnormal; Notable for the following components:      Result Value   WBC 10.6 (*)    All other components within normal limits  CBG MONITORING, ED - Abnormal; Notable for the following components:   Glucose-Capillary 341 (*)    All other  components within normal limits  COMPREHENSIVE METABOLIC PANEL    EKG None  Radiology DG Foot Complete Right  Result Date: 07/23/2021 CLINICAL DATA:  Diabetic foot ulcer.  Evaluation for osteomyelitis EXAM: RIGHT FOOT COMPLETE - 3+ VIEW COMPARISON:  None. FINDINGS: No cortical erosion or periosteal reaction concerning for osteomyelitis. Prominent plantar calcaneal spurring. No acute fracture or dislocation. Deep skin wound about the plantar aspect of the distal foot. IMPRESSION: No radiographic evidence of osteomyelitis. If there is a deep skin wound and clinical concern for osteomyelitis, MRI examination could be obtained for further evaluation. Electronically Signed   By: Larose Hires D.O.   On: 07/23/2021 15:04    Procedures Procedures   Medications Ordered in ED Medications  piperacillin-tazobactam (ZOSYN) IVPB 3.375 g (has no administration in time range)    ED Course  I have reviewed the triage vital signs and the nursing notes.  Pertinent labs & imaging results that were available during my care of the patient were reviewed by me and considered in my medical decision making (see chart for details).  Clinical Course as of 07/23/21 2142  Sun Jul 23, 2021  1759 Spoke to Dr. Sheppard Penton with hospitalist team who will see the patient for admission. [PB]    Clinical Course User Index [PB] Berneice Heinrich   MDM Rules/Calculators/A&P                           Alert 60 year old male no acute stress, nontoxic-appearing.  Presents to ED with chief complaint of wounds to bilateral soles of feet.  Patient has history of diabetes, osteomyelitis, and second left ray amputation.  Wounds to sole of foot as documented and imaged above.  Patient has slight leukocytosis at 10.6.  X-ray imaging of right foot shows no radiographic evidence of osteomyelitis.  X-ray imaging of left  foot shows concern for osteomyelitis at the base of the proximal phalanx, third digit.  Will start patient  on Zosyn and vancomycin for diabetic foot ulcer.  Will obtain lactic acid and wound cultures.    Lactic acid within normal limits.  Cultures pending at this time.  Will consult hospitalist for admission due to concern for osteomyelitis and diabetic foot ulcer.  Spoke to Dr. Eliberto Ivory who will see the patient for admission.  Patient care was discussed with attending physician Dr. Doren Custard.   Final Clinical Impression(s) / ED Diagnoses Final diagnoses:  Diabetic foot infection ([place])  Osteomyelitis of left foot, unspecified type [facility])    Rx / DC Orders ED Discharge Orders     None        Dyann Ruddle 07/23/21 2143    Godfrey Pick, MD 07/24/21 0222

## 2021-07-23 NOTE — ED Provider Notes (Signed)
Emergency Medicine Provider Triage Evaluation Note  Cody Pena , a 60 y.o. male  was evaluated in triage.  Pt complains of bilateral diabetic foot ulcers.  He states that both have been there for about 2 to 3 weeks.  He states that he stepped on something in the house and initially was just red.  States that then he had blistering on the right plantar surface of his foot.  He states that it is then become increasingly red, purulent, and foul-smelling.  He states that he has previous amputations on the left foot and where the incision is on the plantar surface has also begun to have purulent drainage and foul smell.  He denies known fevers at home.  Review of Systems  Positive: See above Negative:   Physical Exam  BP (!) 143/77   Pulse 92   Temp 99.2 F (37.3 C) (Oral)   Resp 16   SpO2 96%  Gen:   Awake, no distress   Resp:  Normal effort  MSK:   Moves extremities without difficulty  Other:  Bilateral plantar surface foot ulcers with purulent drainage.  Malodorous.  Medical Decision Making  Medically screening exam initiated at 2:23 PM.  Appropriate orders placed.  Jaci Carrel was informed that the remainder of the evaluation will be completed by another provider, this initial triage assessment does not replace that evaluation, and the importance of remaining in the ED until their evaluation is complete.     Cristopher Peru, PA-C 07/23/21 1424    Pollyann Savoy, MD 07/23/21 [phone]

## 2021-07-23 NOTE — Progress Notes (Addendum)
Pharmacy Antibiotic Note  Cody Pena is a 59 y.o. male admitted on 07/23/2021 with  wound infection .  Pharmacy has been consulted for vancomycin dosing. Of note, patient was ordered a loading dose of Zosyn in the ED  WBC mildly elevated. SCr wnl  Plan: -Vancomycin 2 gm IV load followed by Vancomycin 1250 mg IV Q 12 hrs. Goal AUC 400-550. Expected AUC: 516 SCr used: 0.9 -F/u Zosyn maintenance dose -Monitor CBC, renal fx, cultures and clinical progress -Vanc levels as indicated       Temp (24hrs), Avg:99.2 F (37.3 C), Min:99.2 F (37.3 C), Max:99.2 F (37.3 C)  Recent Labs  Lab 07/23/21 1441  WBC 10.6*  CREATININE 0.90    CrCl cannot be calculated (Unknown ideal weight.).    No Known Allergies  Antimicrobials this admission: Zosyn 12/11 >>  Vancomycin 12/11 >>   Dose adjustments this admission:   Microbiology results: 12/11 Wound:    Thank you for allowing pharmacy to be a part of this patient's care.  Vinnie Level, PharmD., BCPS, BCCCP Clinical Pharmacist Please refer to [facility] for unit-specific pharmacist   Addendum: Now starting IV zosyn for broad spectrum coverage. Will order Zosyn 3.375 gm IV Q 8 hours (EI infusion).   Vinnie Level, PharmD., BCPS, BCCCP Clinical Pharmacist Please refer to [facility] for unit-specific pharmacist

## 2021-07-24 ENCOUNTER — Inpatient Hospital Stay (HOSPITAL_COMMUNITY): Payer: 59

## 2021-07-24 ENCOUNTER — Observation Stay (HOSPITAL_COMMUNITY): Payer: 59

## 2021-07-24 ENCOUNTER — Other Ambulatory Visit (HOSPITAL_COMMUNITY): Payer: Self-pay

## 2021-07-24 DIAGNOSIS — L0390 Cellulitis, unspecified: Secondary | ICD-10-CM | POA: Diagnosis not present

## 2021-07-24 DIAGNOSIS — E1142 Type 2 diabetes mellitus with diabetic polyneuropathy: Secondary | ICD-10-CM

## 2021-07-24 DIAGNOSIS — L97429 Non-pressure chronic ulcer of left heel and midfoot with unspecified severity: Secondary | ICD-10-CM | POA: Diagnosis present

## 2021-07-24 DIAGNOSIS — F32A Depression, unspecified: Secondary | ICD-10-CM | POA: Diagnosis present

## 2021-07-24 DIAGNOSIS — E11628 Type 2 diabetes mellitus with other skin complications: Secondary | ICD-10-CM | POA: Diagnosis not present

## 2021-07-24 DIAGNOSIS — F39 Unspecified mood [affective] disorder: Secondary | ICD-10-CM | POA: Diagnosis not present

## 2021-07-24 DIAGNOSIS — E1151 Type 2 diabetes mellitus with diabetic peripheral angiopathy without gangrene: Secondary | ICD-10-CM | POA: Diagnosis not present

## 2021-07-24 DIAGNOSIS — F17210 Nicotine dependence, cigarettes, uncomplicated: Secondary | ICD-10-CM | POA: Diagnosis present

## 2021-07-24 DIAGNOSIS — M869 Osteomyelitis, unspecified: Secondary | ICD-10-CM | POA: Diagnosis present

## 2021-07-24 DIAGNOSIS — Z91138 Patient's unintentional underdosing of medication regimen for other reason: Secondary | ICD-10-CM | POA: Diagnosis not present

## 2021-07-24 DIAGNOSIS — Z794 Long term (current) use of insulin: Secondary | ICD-10-CM

## 2021-07-24 DIAGNOSIS — I96 Gangrene, not elsewhere classified: Secondary | ICD-10-CM | POA: Diagnosis not present

## 2021-07-24 DIAGNOSIS — M86172 Other acute osteomyelitis, left ankle and foot: Secondary | ICD-10-CM | POA: Diagnosis present

## 2021-07-24 DIAGNOSIS — Z89422 Acquired absence of other left toe(s): Secondary | ICD-10-CM | POA: Diagnosis not present

## 2021-07-24 DIAGNOSIS — R609 Edema, unspecified: Secondary | ICD-10-CM

## 2021-07-24 DIAGNOSIS — E1169 Type 2 diabetes mellitus with other specified complication: Secondary | ICD-10-CM | POA: Diagnosis present

## 2021-07-24 DIAGNOSIS — Z20822 Contact with and (suspected) exposure to covid-19: Secondary | ICD-10-CM | POA: Diagnosis present

## 2021-07-24 DIAGNOSIS — I739 Peripheral vascular disease, unspecified: Secondary | ICD-10-CM | POA: Diagnosis not present

## 2021-07-24 DIAGNOSIS — I10 Essential (primary) hypertension: Secondary | ICD-10-CM | POA: Diagnosis present

## 2021-07-24 DIAGNOSIS — L089 Local infection of the skin and subcutaneous tissue, unspecified: Secondary | ICD-10-CM

## 2021-07-24 DIAGNOSIS — I70235 Atherosclerosis of native arteries of right leg with ulceration of other part of foot: Secondary | ICD-10-CM | POA: Diagnosis not present

## 2021-07-24 DIAGNOSIS — L97416 Non-pressure chronic ulcer of right heel and midfoot with bone involvement without evidence of necrosis: Secondary | ICD-10-CM | POA: Diagnosis present

## 2021-07-24 DIAGNOSIS — E785 Hyperlipidemia, unspecified: Secondary | ICD-10-CM | POA: Diagnosis present

## 2021-07-24 DIAGNOSIS — E1152 Type 2 diabetes mellitus with diabetic peripheral angiopathy with gangrene: Secondary | ICD-10-CM | POA: Diagnosis present

## 2021-07-24 DIAGNOSIS — Z79899 Other long term (current) drug therapy: Secondary | ICD-10-CM | POA: Diagnosis not present

## 2021-07-24 DIAGNOSIS — Z7902 Long term (current) use of antithrombotics/antiplatelets: Secondary | ICD-10-CM | POA: Diagnosis not present

## 2021-07-24 DIAGNOSIS — Z95820 Peripheral vascular angioplasty status with implants and grafts: Secondary | ICD-10-CM | POA: Diagnosis not present

## 2021-07-24 DIAGNOSIS — M86271 Subacute osteomyelitis, right ankle and foot: Secondary | ICD-10-CM | POA: Diagnosis not present

## 2021-07-24 DIAGNOSIS — I70245 Atherosclerosis of native arteries of left leg with ulceration of other part of foot: Secondary | ICD-10-CM | POA: Diagnosis not present

## 2021-07-24 DIAGNOSIS — E11621 Type 2 diabetes mellitus with foot ulcer: Secondary | ICD-10-CM

## 2021-07-24 DIAGNOSIS — L97414 Non-pressure chronic ulcer of right heel and midfoot with necrosis of bone: Secondary | ICD-10-CM

## 2021-07-24 DIAGNOSIS — F419 Anxiety disorder, unspecified: Secondary | ICD-10-CM | POA: Diagnosis present

## 2021-07-24 DIAGNOSIS — T383X6A Underdosing of insulin and oral hypoglycemic [antidiabetic] drugs, initial encounter: Secondary | ICD-10-CM | POA: Diagnosis present

## 2021-07-24 LAB — LIPID PANEL
Cholesterol: 131 mg/dL (ref 0–200)
HDL: 28 mg/dL — ABNORMAL LOW (ref >40)
LDL Cholesterol: 48 mg/dL (ref 0–99)
Total CHOL/HDL Ratio: 4.7 RATIO
Triglycerides: 273 mg/dL — ABNORMAL HIGH (ref <150)
VLDL: 55 mg/dL — ABNORMAL HIGH (ref 0–40)

## 2021-07-24 LAB — CBC
HCT: 43.1 % (ref 39.0–52.0)
Hemoglobin: 14.9 g/dL (ref 13.0–17.0)
MCH: 32.8 pg (ref 26.0–34.0)
MCHC: 34.6 g/dL (ref 30.0–36.0)
MCV: 94.9 fL (ref 80.0–100.0)
Platelets: 198 10*3/uL (ref 150–400)
RBC: 4.54 MIL/uL (ref 4.22–5.81)
RDW: 12 % (ref 11.5–15.5)
WBC: 10 10*3/uL (ref 4.0–10.5)
nRBC: 0 % (ref 0.0–0.2)

## 2021-07-24 LAB — BASIC METABOLIC PANEL
Anion gap: 8 (ref 5–15)
BUN: 10 mg/dL (ref 8–23)
CO2: 28 mmol/L (ref 22–32)
Calcium: 8.4 mg/dL — ABNORMAL LOW (ref 8.9–10.3)
Chloride: 97 mmol/L — ABNORMAL LOW (ref 98–111)
Creatinine, Ser: 0.91 mg/dL (ref 0.61–1.24)
GFR, Estimated: >60 mL/min (ref >60)
Glucose, Bld: 259 mg/dL — ABNORMAL HIGH (ref 70–99)
Potassium: 3.9 mmol/L (ref 3.5–5.1)
Sodium: 133 mmol/L — ABNORMAL LOW (ref 135–145)

## 2021-07-24 LAB — HIV ANTIBODY (ROUTINE TESTING W REFLEX): HIV Screen 4th Generation wRfx: NONREACTIVE

## 2021-07-24 LAB — GLUCOSE, CAPILLARY
Glucose-Capillary: 205 mg/dL — ABNORMAL HIGH (ref 70–99)
Glucose-Capillary: 169 mg/dL — ABNORMAL HIGH (ref 70–99)
Glucose-Capillary: 190 mg/dL — ABNORMAL HIGH (ref 70–99)
Glucose-Capillary: 166 mg/dL — ABNORMAL HIGH (ref 70–99)

## 2021-07-24 IMAGING — MR MR FOOT*R* W/O CM
4 of 6 series · 19 of 40 positions shown · non-contrast
Comparison: Right foot radiograph [DATE]

CLINICAL DATA: Foot swelling, diabetic, osteomyelitis suspected,
xray done

EXAM:
MRI OF THE RIGHT FOREFOOT WITHOUT CONTRAST
TECHNIQUE: Multiplanar, multisequence MR imaging of the right forefoot was
performed. No intravenous contrast was administered.

[Series 4: T1 · oblique · 3.0mm · 0.27mm/px · 5 of 55 slices shown (1 of 2)]
[im 1/55]
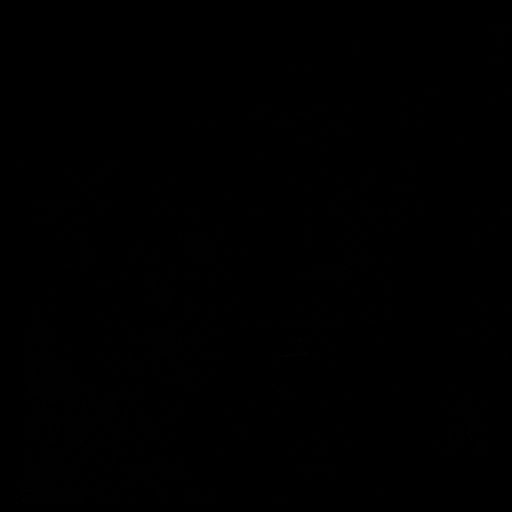
[im 8/55]
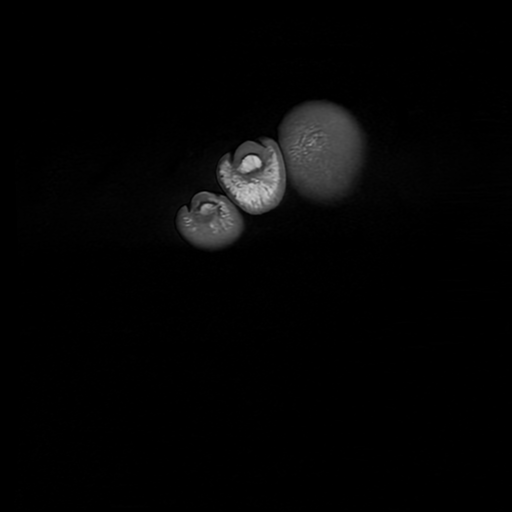
[im 16/55]
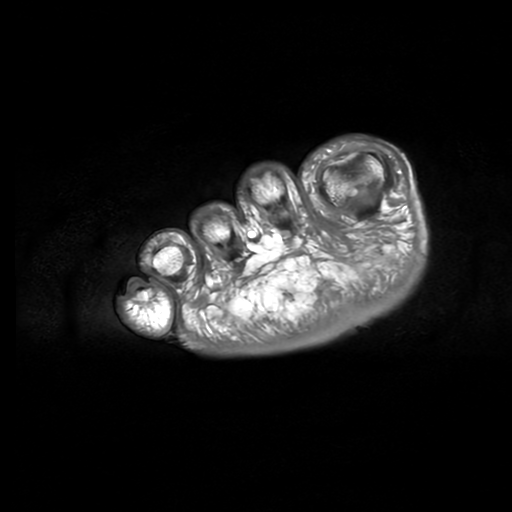
[im 31/55]
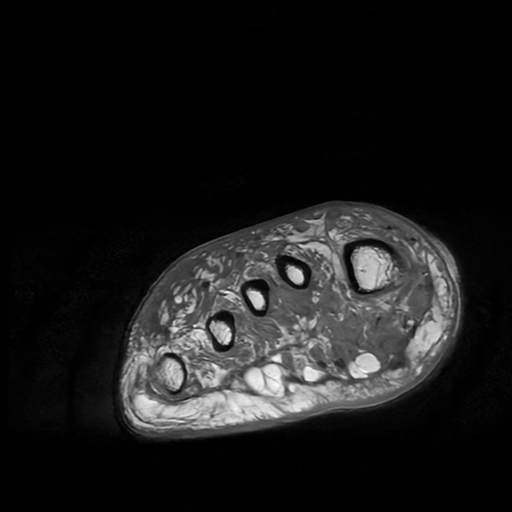
[im 47/55]
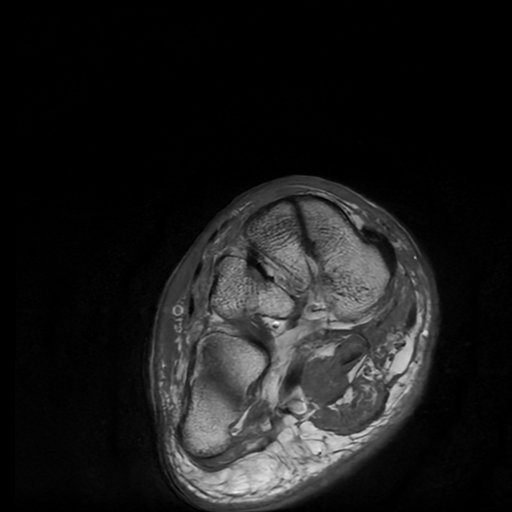

[Series 5: T1 fat-sat · oblique · 3.0mm · 0.27mm/px · 3 of 55 slices shown]
[im 8/55]
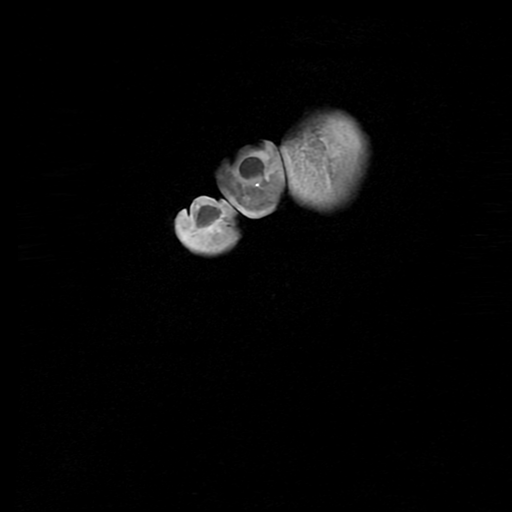
[im 31/55]
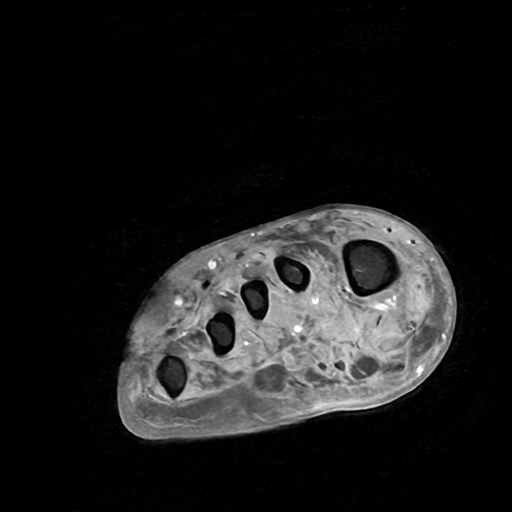
[im 47/55]
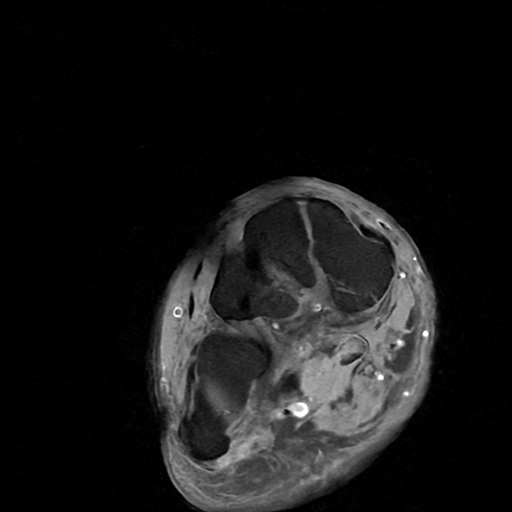

[Series 6: T2 fat-sat · oblique · 3.0mm · 0.27mm/px · 8 of 55 slices shown]
[im 1/55]
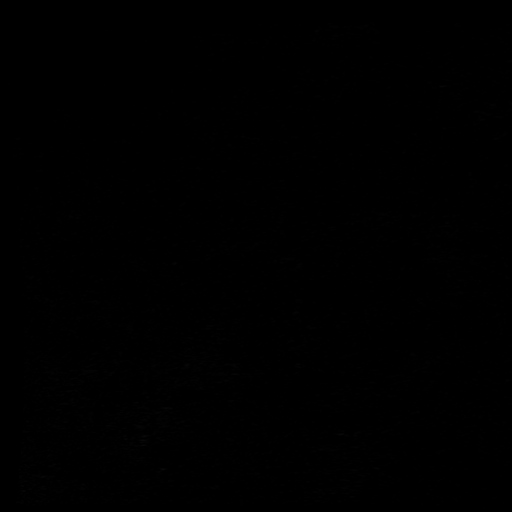
[im 8/55]
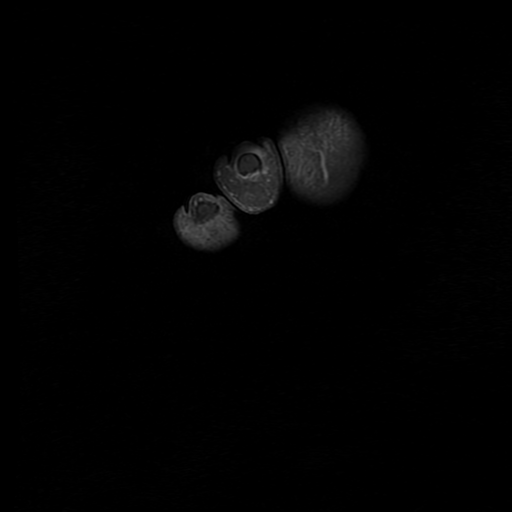
[im 16/55]
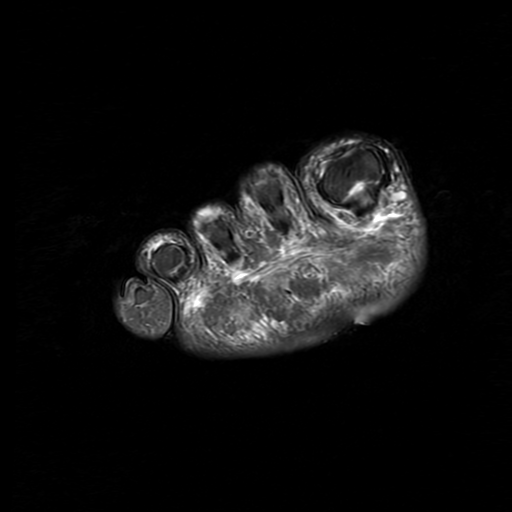
[im 24/55]
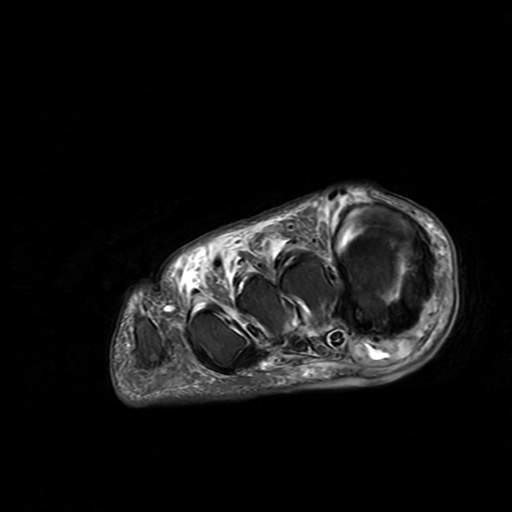
[im 31/55]
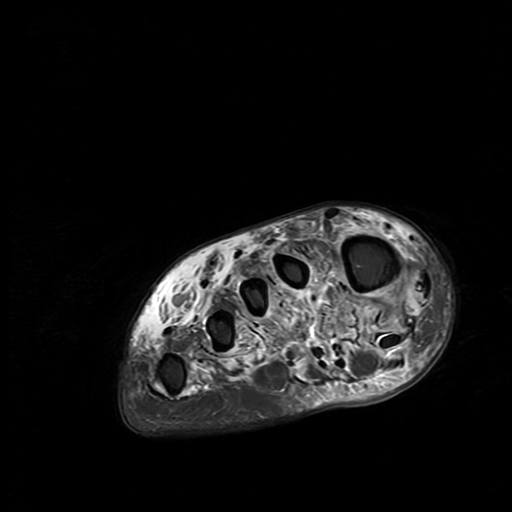
[im 39/55]
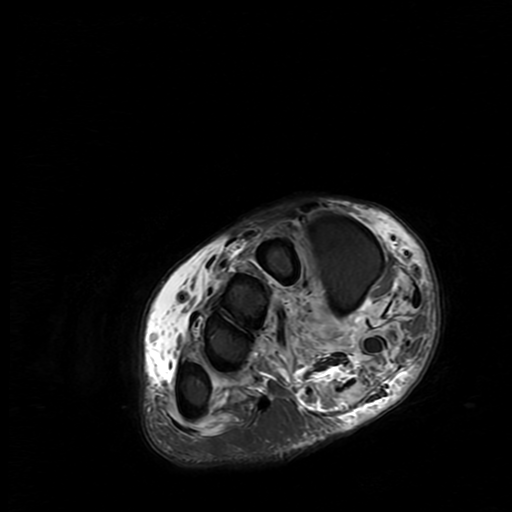
[im 47/55]
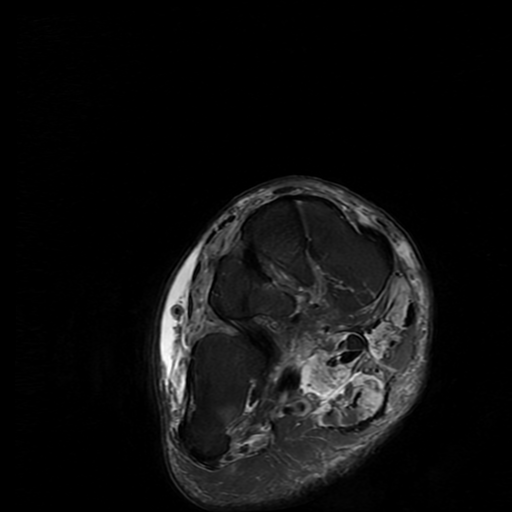
[im 55/55]
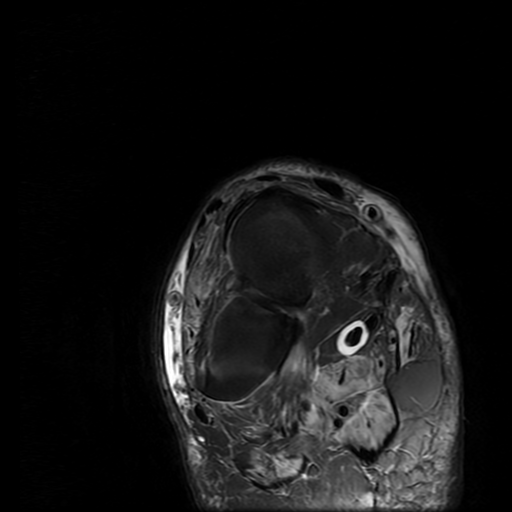

[Series 8: T1 · oblique · 3.0mm · 0.37mm/px · 3 of 32 slices shown (2 of 2)]
[im 1/32]
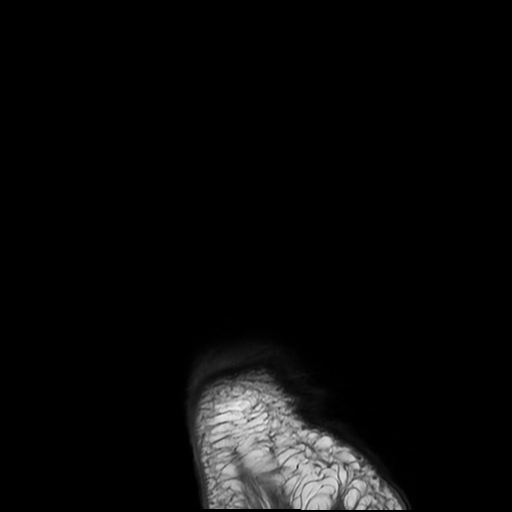
[im 16/32]
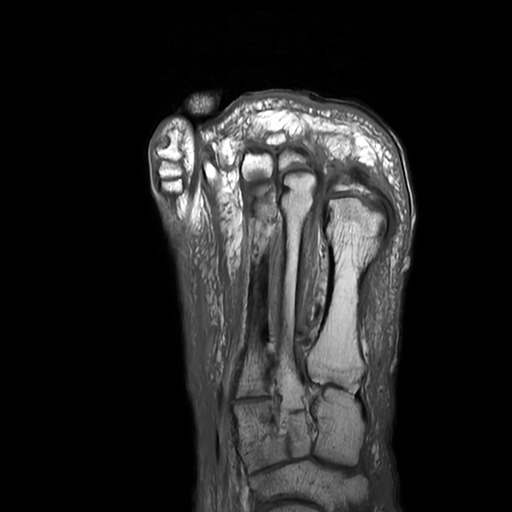
[im 32/32]
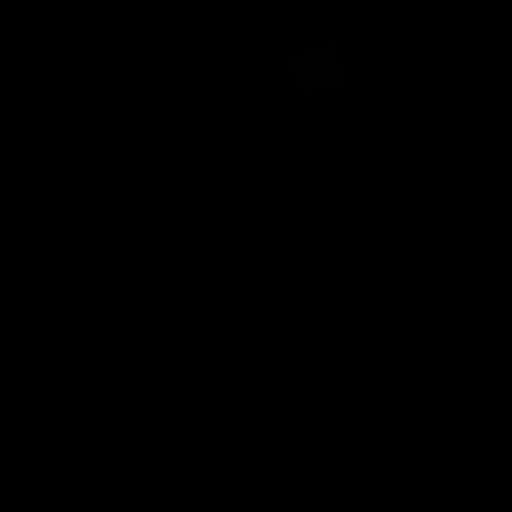

[19 of 40 positions shown; findings below may reference images not displayed]

FINDINGS: Bones/Joint/Cartilage

There is no significant marrow signal alteration. The cortex is
intact.

Ligaments

Intact Lisfranc ligament.  Intact MTP collateral ligaments.

Muscles and Tendons

Diffuse intramuscular edema and mild atrophy in the foot, as is
commonly seen in diabetics.

Soft tissues

Diffuse soft tissue swelling dorsally. There is a plantar soft
tissue ulcer along the medial forefoot. Ill-defined fluid and edema
adjacent to the ulcer in the medial forefoot (axial T2 image 35,
sagittal STIR image 26.
IMPRESSION: Plantar soft tissue ulcer along the medial forefoot. No evidence of
underlying osteomyelitis. Ill-defined fluid and edema adjacent to
the ulcer, favored to be localized inflammatory change. Developing
phlegmon/abscess is possible, correlate with drainage.

## 2021-07-24 MED ORDER — JUVEN PO PACK
1.0000 | PACK | Freq: Two times a day (BID) | ORAL | Status: DC
  Administered 2021-07-24 – 2021-07-26 (×4): 1 via ORAL
  Filled 2021-07-24 (×4): qty 1

## 2021-07-24 MED ORDER — ENOXAPARIN SODIUM 40 MG/0.4ML IJ SOSY
40.0000 mg | PREFILLED_SYRINGE | INTRAMUSCULAR | Status: DC
  Administered 2021-07-24 – 2021-07-26 (×3): 40 mg via SUBCUTANEOUS
  Filled 2021-07-24 (×3): qty 0.4

## 2021-07-24 MED ORDER — INSULIN ASPART 100 UNIT/ML IJ SOLN
4.0000 [IU] | Freq: Three times a day (TID) | INTRAMUSCULAR | Status: DC
  Administered 2021-07-24 – 2021-07-26 (×7): 4 [IU] via SUBCUTANEOUS

## 2021-07-24 MED ORDER — INSULIN GLARGINE-YFGN 100 UNIT/ML ~~LOC~~ SOLN
20.0000 [IU] | Freq: Every day | SUBCUTANEOUS | Status: DC
  Administered 2021-07-24 – 2021-07-25 (×2): 20 [IU] via SUBCUTANEOUS
  Filled 2021-07-24 (×3): qty 0.2

## 2021-07-24 NOTE — Progress Notes (Addendum)
Initial Nutrition Assessment  DOCUMENTATION CODES:   Not applicable  INTERVENTION:   Encourage good PO intake Continue Multivitamin w/ minerals daily 1 packet Juven BID, each packet provides 95 calories, 2.5 grams of protein (collagen), and 9.8 grams of carbohydrate (3 grams sugar); also contains 7 grams of L-arginine and L-glutamine, 300 mg vitamin C, 15 mg vitamin E, 1.2 mcg vitamin B-12, 9.5 mg zinc, 200 mg calcium, and 1.5 g  Calcium Beta-hydroxy-Beta-methylbutyrate to support wound healing Diet education Recommend obtaining micronutrient labs. MD messaged.   NUTRITION DIAGNOSIS:   Increased nutrient needs related to wound healing as evidenced by estimated needs.  GOAL:   Patient will meet greater than or equal to 90% of their needs  MONITOR:   PO intake, Supplement acceptance, Skin, Labs  REASON FOR ASSESSMENT:   Malnutrition Screening Tool    ASSESSMENT:   60 y.o. male presented to the ED with BLE foot wounds. PMH includes T2DM, diabetic neuropathy, PVD, HTN, and tobacco abuse. Pt admitted with bilateral diabetic foot ulcers w/ concern of osteomyelitis.   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   Pt reports that his appetite has been poor for ~3-4 months, pt unsure why. Pt reports that he travels for work a lot. Pt reports a typical intake of: Breakfast: steak biscuit breakfast sandwich from [employer] Lunch: whatever restaurant he is working at [employer]: meat, vegetable, and potato  Discussed the plate method with the pt to help with glucose control; pt expressed good understanding of the material. Pt says that he would like whatever he can get. Discussed the use of non-sugary beverages or use artificial sweeteners to sweet them. Pt reports that is where he struggles. Added "Plate Method for Diabetics" to after visit summary for pt.   Pt reports that he always weighted 265# and last  remembered weighting that a year ago, then 3-4 months ago was 250#, and now weighs 230#. No recent weights within EMR.  Discussed proper nutrition and glycemic control to help heal his wounds. Pt reports that he is willing to do whatever it takes. Discussed ONS with pt; pt agreeable to ONS.   Medications reviewed and include: SSI 0-15 units TID + 4 units TID, Semglee 20 units, MVI, IV antibiotics Labs reviewed: - Sodium 133  - Hgb A1c 12.5 % - 24 hr BG trends 205-341   NUTRITION - FOCUSED PHYSICAL EXAM:  Flowsheet Row Most Recent Value  Orbital Region Moderate depletion  Upper Arm Region Mild depletion  Thoracic and Lumbar Region Mild depletion  Buccal Region Moderate depletion  Temple Region Mild depletion  Clavicle Bone Region No depletion  Clavicle and Acromion Bone Region No depletion  Scapular Bone Region No depletion  Dorsal Hand No depletion  Patellar Region No depletion  Anterior Thigh Region No depletion  Posterior Calf Region No depletion  Edema (RD Assessment) None  Hair Reviewed  Eyes Reviewed  Mouth Reviewed  Skin Reviewed  Nails Reviewed       Diet Order:   Diet Order             Diet heart healthy/carb modified Room service appropriate? Yes; Fluid consistency: Thin  Diet effective now                   EDUCATION NEEDS:   Education needs have been addressed  Skin:  Skin Assessment: Skin Integrity Issues: Skin Integrity Issues:: Diabetic Ulcer Diabetic Ulcer: L &  R foot  Last BM:  07/21/2021  Height:   Ht Readings from Last 1 Encounters:  07/23/21 6\' 3"  (1.905 m)    Weight:   Wt Readings from Last 1 Encounters:  07/23/21 104.5 kg    Ideal Body Weight:  89.1 kg  BMI:  Body mass index is 28.8 kg/m.  Estimated Nutritional Needs:   Kcal:  2300-2500  Protein:  115-130 grams  Fluid:  > 2 L    Cody Pena 14/11/22, RD, LDN Clinical Dietitian See [facility] for contact information.

## 2021-07-24 NOTE — Plan of Care (Signed)
  Problem: Health Behavior/Discharge Planning: Goal: Ability to manage health-related needs will improve Outcome: Progressing   Problem: Clinical Measurements: Goal: Diagnostic test results will improve Outcome: Progressing   

## 2021-07-24 NOTE — Consult Note (Signed)
ORTHOPAEDIC CONSULTATION  REQUESTING PHYSICIAN: Maretta Bees, MD  Chief Complaint: Foul-smelling ulcer right foot.  HPI: Cody Pena is a 60 y.o. male who presents with ulceration beneath the second metatarsal head right foot with necrotic soft tissue and foul-smelling drainage.  Patient has uncontrolled type 2 diabetes.  Past Medical History:  Diagnosis Date   Diabetes mellitus without complication (HCC)    Hypertension    Past Surgical History:  Procedure Laterality Date   AMPUTATION Left 11/15/2017   Procedure: LEFT FOOT 2ND RAY AMPUTATION;  Surgeon: Nadara Mustard, MD;  Location: [facility] OR;  Service: Orthopedics;  Laterality: Left;   CERVICAL FUSION     IR ANGIOGRAM EXTREMITY LEFT  11/14/2017   IR ANGIOGRAM SELECTIVE EACH ADDITIONAL VESSEL  11/14/2017   IR ANGIOGRAM SELECTIVE EACH ADDITIONAL VESSEL  11/14/2017   IR TIB-PERO ART PTA MOD SED  11/14/2017   IR TIB-PERO ART UNI PTA EA ADD VESSEL MOD SED  11/14/2017   IR US GUIDE VASC ACCESS LEFT  11/14/2017   Social History   Socioeconomic History   Marital status: Married    Spouse name: Not on file   Number of children: Not on file   Years of education: Not on file   Highest education level: Not on file  Occupational History   Not on file  Tobacco Use   Smoking status: Every Day   Smokeless tobacco: Never  Substance and Sexual Activity   Alcohol use: No   Drug use: No   Sexual activity: Not on file  Other Topics Concern   Not on file  Social History Narrative   Not on file   Social Determinants of Health   Financial Resource Strain: Not on file  Food Insecurity: Not on file  Transportation Needs: Not on file  Physical Activity: Not on file  Stress: Not on file  Social Connections: Not on file   No family history on file. - negative except otherwise stated in the family history section No Known Allergies Prior to Admission medications   Medication Sig Start Date End Date Taking? Authorizing Provider   atorvastatin (LIPITOR) 80 MG tablet Take 80 mg by mouth daily.   Yes [provider]  citalopram (CELEXA) 40 MG tablet Take 40 mg by mouth daily.   Yes [provider]  clonazePAM (KLONOPIN) 0.5 MG tablet Take 0.5 mg by mouth daily as needed. 06/30/21  Yes [provider]  clopidogrel (PLAVIX) 75 MG tablet Take 75 mg by mouth daily. 07/13/21  Yes [provider]  gabapentin (NEURONTIN) 600 MG tablet Take 600 mg by mouth 3 (three) times daily.   Yes [provider]  Multiple Vitamin (MULTIVITAMIN WITH MINERALS) TABS tablet Take 1 tablet by mouth daily. 11/17/17  Yes Hall, Carole N, DO  nicotine (NICODERM CQ - DOSED IN MG/24 HOURS) 21 mg/24hr patch Place 1 patch (21 mg total) onto the skin daily. 11/17/17  Yes Darlin Drop, DO  oxyCODONE-acetaminophen (PERCOCET) 10-325 MG tablet Take 1 tablet by mouth every 4 (four) hours as needed for pain. May take 1 tablet 06/30/21  Yes [provider]  Vitamin D, Ergocalciferol, (DRISDOL) 50000 units CAPS capsule Take 50,000 Units by mouth every 7 (seven) days.   Yes [provider]  Dulaglutide (TRULICITY [place]) Inject 1 Device into the skin every 7 (seven) days.  Patient not taking: Reported on 07/23/2021    [provider]  feeding supplement, ENSURE ENLIVE, (ENSURE ENLIVE) LIQD Take 237 mLs  by mouth 2 (two) times daily between meals. Patient not taking: Reported on 07/23/2021 11/16/17   Kayleen Memos, DO  Insulin Glargine (BASAGLAR KWIKPEN [place]) Inject 80 Units into the skin at bedtime.  Patient not taking: Reported on 07/23/2021    [provider]  oxyCODONE (OXY IR/ROXICODONE) 5 MG immediate release tablet Take 1-2 tablets (5-10 mg total) by mouth 2 (two) times daily. Patient not taking: Reported on 07/23/2021 11/16/17   Kayleen Memos, DO  pentoxifylline (TRENTAL) 400 MG CR tablet Take 1 tablet (400 mg total) by mouth 3 (three) times daily with meals. Patient not taking: Reported on  07/23/2021 12/11/17   Newt Minion, MD  polyethylene glycol [facility] Floria Raveling) packet Take 17 g by mouth daily. Patient not taking: Reported on 07/23/2021 11/17/17   Kayleen Memos, DO   DG Foot Complete Left  Result Date: 07/23/2021 CLINICAL DATA:  Diabetic foot ulcers. EXAM: LEFT FOOT - COMPLETE 3+ VIEW COMPARISON:  Foot radiograph 08/07/2018 FINDINGS: Osteotomy of the second ray unchanged from prior. There is new loss of the third ray metacarpal head which may be postsurgical. There is rarefaction of the bone at the base of the proximal phalanx third digit along the medial border. Plantar ulceration the level of the metatarsal heads seen on lateral projection. No foreign body identified IMPRESSION: 1. Concern for osteomyelitis at the base of the proximal phalanx third digit. 2. Probable osteotomy of the third ray RIGHT metatarsal. Prior second ray osteotomy. 3. Soft tissue ulceration along the plantar surface of the metatarsal head row. No foreign body. Electronically Signed   By: Suzy Bouchard M.D.   On: 07/23/2021 15:10   DG Foot Complete Right  Result Date: 07/23/2021 CLINICAL DATA:  Diabetic foot ulcer.  Evaluation for osteomyelitis EXAM: RIGHT FOOT COMPLETE - 3+ VIEW COMPARISON:  None. FINDINGS: No cortical erosion or periosteal reaction concerning for osteomyelitis. Prominent plantar calcaneal spurring. No acute fracture or dislocation. Deep skin wound about the plantar aspect of the distal foot. IMPRESSION: No radiographic evidence of osteomyelitis. If there is a deep skin wound and clinical concern for osteomyelitis, MRI examination could be obtained for further evaluation. Electronically Signed   By: Keane Police D.O.   On: 07/23/2021 15:04   - pertinent xrays, CT, MRI studies were reviewed and independently interpreted  Positive ROS: All other systems have been reviewed and were otherwise negative with the exception of those mentioned in the HPI and as above.  Physical  Exam: General: Alert, no acute distress Psychiatric: Patient is competent for consent with normal mood and affect Lymphatic: No axillary or cervical lymphadenopathy Cardiovascular: No pedal edema Respiratory: No cyanosis, no use of accessory musculature GI: No organomegaly, abdomen is soft and non-tender    Images:  @ENCIMAGES @  Labs:  Lab Results  Component Value Date   HGBA1C 12.5 (H) 07/23/2021   HGBA1C 7.2 (H) 11/10/2017   HGBA1C 8.1 (H) 12/07/2014   ESRSEDRATE 60 (H) 11/10/2017   ESRSEDRATE 20 (H) 12/07/2014   CRP 3.2 (H) 11/10/2017   CRP 7.0 (H) 12/07/2014   REPTSTATUS PENDING 07/23/2021   REPTSTATUS PENDING 07/23/2021   GRAMSTAIN  07/23/2021    RARE WBC PRESENT, PREDOMINANTLY MONONUCLEAR FEW GRAM POSITIVE COCCI IN PAIRS AND CHAINS Performed at [facility], [place] [address], [place], [place] [address]    GRAMSTAIN  07/23/2021    NO WBC SEEN MODERATE GRAM POSITIVE COCCI FEW GRAM POSITIVE RODS Performed at [facility], [place]  [address], [place], [place] [address]    CULT PENDING 07/23/2021   CULT PENDING 07/23/2021    Lab Results  Component Value Date   ALBUMIN 3.1 (L) 07/23/2021   ALBUMIN 2.9 (L) 11/14/2017   ALBUMIN 2.9 (L) 11/11/2017     CBC EXTENDED Latest Ref Rng & Units 07/24/2021 07/23/2021 11/14/2017  WBC 4.0 - 10.5 K/uL 10.0 10.6(H) 9.6  RBC 4.22 - 5.81 MIL/uL 4.54 4.76 4.14(L)  HGB 13.0 - 17.0 g/dL 29.7 98.9 12.8(L)  HCT 39.0 - 52.0 % 43.1 45.3 38.8(L)  PLT 150 - 400 K/uL 198 155 272  NEUTROABS 1.7 - 7.7 K/uL - 7.0 -  LYMPHSABS 0.7 - 4.0 K/uL - 2.5 -    Neurologic: Patient does not have protective sensation bilateral lower extremities.   MUSCULOSKELETAL:   Skin: Examination patient has a necrotic ulcer 2 cm in diameter beneath the right second metatarsal head.  The second metatarsal head is palpable within the bed of the necrotic wound.  There is cellulitis of the foot.  Left foot has a plantar ulcer but no cellulitis the ulcer  appears superficial.  I cannot palpate a good dorsalis pedis or posterior tibial pulse on the right.  Radiographs of the right foot shows no destructive bony changes. Assessment: Assessment: Uncontrolled type 2 diabetes with a necrotic ulcer beneath the second metatarsal head right foot concerning for osteomyelitis.  Plan: Ankle-brachial indices are ordered this morning, I ordered an MRI scan of the right foot to further evaluate the second metatarsal head.  Thank you for the consult and the opportunity to see Mr. Praise Dolecki, MD [facility] [phone] 7:48 AM

## 2021-07-24 NOTE — Consult Note (Signed)
WOC Nurse Consult Note: Reason for ConsultBilateral plantar foot wounds. Right foot positive for osteomyelitis and Dr Lajoyce Corners Aware. 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  Wound type: neuropathic  infectious Pressure Injury POA:NA Measurement: Left plantar foot 2 cm x1.5 cm calloused lesion Left second metatarsal amputated previously Right plantar foot 3 cmx 2 cm ulceration with noted osteomyelitis  Wound LKT:GYBWLSLHTDS tissue.  Drainage (amount, consistency, odor) minimal tan purulence  musty odor.  Periwound:intact Dressing procedure/placement/frequency: Cleanse wounds to left and right plantar feet with NS and pat dry.  Apply Xeroform gauze to wound bed. Cover with dry dressing, kerlix and tape. Change daily.  Will not follow at this time.  Please re-consult if needed.  Maple Hudson MSN, RN, FNP-BC CWON Wound, Ostomy, Continence Nurse Pager [phone]

## 2021-07-24 NOTE — Progress Notes (Signed)
Inpatient Diabetes Program Recommendations  AACE/ADA: New Consensus Statement on Inpatient Glycemic Control (2015)  Target Ranges:  Prepandial:   less than 140 mg/dL      Peak postprandial:   less than 180 mg/dL (1-2 hours)      Critically ill patients:  140 - 180 mg/dL   Lab Results  Component Value Date   GLUCAP 166 (H) 07/24/2021   HGBA1C 12.5 (H) 07/23/2021    Review of Glycemic Control  Diabetes history: DM2 Outpatient Diabetes medications: None, previously on Basaglar 80 units QHS, Trulicity Current orders for Inpatient glycemic control: Semglee 20 units QHS, Novolog 0-15 units TID with meals + 4 units TID  HgbA1C - 12.5% CBGs: 205, 169, 190, 166 mg/dL  Inpatient Diabetes Program Recommendations:    Agree with orders.  Spoke with pt at bedside regarding his diabetes control at home and HgbA1C of 12.5%. Pt states he stopped taking Basaglar and Trulicity d/t cost of meds. Pt would like a more affordable insulin at discharge.  Discussed basic pathophysiology of DM Type 2, basic home care, importance of checking CBGs and maintaining good CBG control to prevent long-term and short-term complications. Reviewed glucose and A1C goals and explained that patient will need to continue to monitor blood sugars and f/u with PCP for management of his diabetes. Reviewed signs and symptoms of hyperglycemia and hypoglycemia along with treatment for both. Discussed impact of nutrition, exercise, stress, sickness, and medications on diabetes control. Reviewed Living Well with diabetes booklet and encouraged patient to read through entire book.  Will see in am to answer any f/u questions pt may have, and review insulin pen administration. Pt states he is motivated now to control his blood sugars.   Thank you. Ailene Ards, RD, LDN, CDE Inpatient Diabetes Coordinator [phone]

## 2021-07-24 NOTE — Progress Notes (Signed)
PROGRESS NOTE        PATIENT DETAILS Name: Cody Pena Age: 60 y.o. Sex: male Date of Birth: 1961-04-02 Admit Date: 07/23/2021 Admitting Physician Orland Mustard, MD ZOX:[id number], [name], MD  Brief Narrative: Patient is a 60 y.o. male with history of DM-2, PAD, HTN, HLD-s/p left second toe amputation in 2019-presenting with bilateral diabetic foot ulcers (bilateral plantar ulcers).  See below for further details.  Subjective: Lying comfortably in bed-denies any chest pain or shortness of breath.  Objective: Vitals: Blood pressure 116/74, pulse 76, temperature 98.3 F (36.8 C), temperature source Oral, resp. rate 16, height  (1.905 m), weight 104.5 kg, SpO2 93 %.   Exam: Gen Exam:Alert awake-not in any distress HEENT:atraumatic, normocephalic Chest: B/L clear to auscultation anteriorly CVS:S1S2 regular Abdomen:soft non tender, non distended Extremities:no edema Neurology: Non focal Skin: no rash  Pertinent Labs/Radiology: Recent Labs  Lab 07/23/21 1441 07/24/21 0150  WBC 10.6* 10.0  HGB 15.8 14.9  PLT 155 198  NA 132* 133*  K 3.9 3.9  CREATININE 0.90 0.91  AST 11*  --   ALT 12  --   ALKPHOS 93  --   BILITOT 0.5  --      Assessment/Plan: Bilateral diabetic foot with acute osteomyelitis of second metatarsal head of left foot: Mild erythema persists-no obvious discharge from bilateral foot ulcers-continue Vanco/Zosyn-awaiting ABI.  Appreciate orthopedic evaluation-awaiting MRI of the right foot to rule out underlying osteomyelitis.  DM-2 (A1c 12.5 on 07/23/2021): CBGs on the higher side-has poor outpatient control given significantly elevated A1c.  Increase Semglee to 20 units, add 4 units of NovoLog with meals-follow closely.  Recent Labs    07/23/21 1428 07/23/21 2252 07/24/21 0820  GLUCAP 341* 239* 205*    HTN: BP stable-no longer on any antihypertensives  History of PAD-s/p left SFA atherectomy and stenting in 2018)  continue Plavix/statin-awaiting ABI.  Foot appears warm but dorsalis pedis pulses are hard to palpate.  Peripheral neuropathy: Continue Neurontin  Mood disorder: Stable-continue Celexa  Tobacco abuse: Continue transdermal nicotine-encourage  cessation  BMI Estimated body mass index is 28.8 kg/m as calculated from the following:   Height as of this encounter:  (1.905 m).   Weight as of this encounter: 104.5 kg.    Procedures: None Consults: Ortho DVT Prophylaxis: Lovenox Code Status:Full code or DNR Family Communication: None at bedside  Time spent: 35 minutes-Greater than 50% of this time was spent in counseling, explanation of diagnosis, planning of further management, and coordination of care.   Disposition Plan: Status is: Observation  The patient will require care spanning > 2 midnights and should be moved to inpatient because: Diabetic foot with underlying osteomyelitis-on IV antibiotics-will require surgical intervention in the next few days.    Diet: Diet Order             Diet heart healthy/carb modified Room service appropriate? Yes; Fluid consistency: Thin  Diet effective now                     Antimicrobial agents: Anti-infectives (From admission, onward)    Start     Dose/Rate Route Frequency Ordered Stop   07/24/21 0600  vancomycin (VANCOREADY) IVPB 1250 mg/250 mL        1,250 mg 166.7 mL/hr over 90 Minutes Intravenous Every 12 hours 07/23/21 1611  07/23/21 2200  piperacillin-tazobactam (ZOSYN) IVPB 3.375 g        3.375 g 12.5 mL/hr over 240 Minutes Intravenous Every 8 hours 07/23/21 1922     07/23/21 1630  vancomycin (VANCOREADY) IVPB 2000 mg/400 mL        2,000 mg 200 mL/hr over 120 Minutes Intravenous  Once 07/23/21 1605 07/23/21 2017   07/23/21 1530  piperacillin-tazobactam (ZOSYN) IVPB 3.375 g        3.375 g 100 mL/hr over 30 Minutes Intravenous  Once 07/23/21 1523 07/23/21 1715        MEDICATIONS: Scheduled Meds:   atorvastatin  80 mg Oral Daily   citalopram  40 mg Oral Daily   clopidogrel  75 mg Oral Daily   enoxaparin (LOVENOX) injection  40 mg Subcutaneous Q24H   gabapentin  600 mg Oral TID   insulin aspart  0-15 Units Subcutaneous TID WC   insulin aspart  4 Units Subcutaneous TID WC   insulin glargine-yfgn  20 Units Subcutaneous QHS   multivitamin with minerals  1 tablet Oral Daily   nicotine  21 mg Transdermal Once   nicotine  21 mg Transdermal Daily   sodium chloride flush  3 mL Intravenous Q12H   Continuous Infusions:  sodium chloride     piperacillin-tazobactam (ZOSYN)  IV 3.375 g (07/24/21 0819)   vancomycin 1,250 mg (07/24/21 0509)   PRN Meds:.sodium chloride, acetaminophen **OR** acetaminophen, clonazePAM, morphine injection, oxyCODONE-acetaminophen **AND** oxyCODONE, senna-docusate, sodium chloride flush   I have personally reviewed following labs and imaging studies  LABORATORY DATA: CBC: Recent Labs  Lab 07/23/21 1441 07/24/21 0150  WBC 10.6* 10.0  NEUTROABS 7.0  --   HGB 15.8 14.9  HCT 45.3 43.1  MCV 95.2 94.9  PLT 155 198    Basic Metabolic Panel: Recent Labs  Lab 07/23/21 1441 07/24/21 0150  NA 132* 133*  K 3.9 3.9  CL 96* 97*  CO2 27 28  GLUCOSE 338* 259*  BUN 9 10  CREATININE 0.90 0.91  CALCIUM 8.5* 8.4*    GFR: Estimated Creatinine Clearance: 110.1 mL/min (by C-G formula based on SCr of 0.91 mg/dL).  Liver Function Tests: Recent Labs  Lab 07/23/21 1441  AST 11*  ALT 12  ALKPHOS 93  BILITOT 0.5  PROT 6.5  ALBUMIN 3.1*   No results for input(s): LIPASE, AMYLASE in the last 168 hours. No results for input(s): AMMONIA in the last 168 hours.  Coagulation Profile: No results for input(s): INR, PROTIME in the last 168 hours.  Cardiac Enzymes: No results for input(s): CKTOTAL, CKMB, CKMBINDEX, TROPONINI in the last 168 hours.  BNP (last 3 results) No results for input(s): PROBNP in the last 8760 hours.  Lipid Profile: Recent Labs     07/24/21 0150  CHOL 131  HDL 28*  LDLCALC 48  TRIG 409*  CHOLHDL 4.7    Thyroid Function Tests: No results for input(s): TSH, T4TOTAL, FREET4, T3FREE, THYROIDAB in the last 72 hours.  Anemia Panel: No results for input(s): VITAMINB12, FOLATE, FERRITIN, TIBC, IRON, RETICCTPCT in the last 72 hours.  Urine analysis:    Component Value Date/Time   COLORURINE YELLOW 12/07/2014 0325   APPEARANCEUR CLEAR 12/07/2014 0325   LABSPEC 1.023 12/07/2014 0325   PHURINE 5.5 12/07/2014 0325   GLUCOSEU 500 (A) 12/07/2014 0325   HGBUR NEGATIVE 12/07/2014 0325   BILIRUBINUR NEGATIVE 12/07/2014 0325   KETONESUR NEGATIVE 12/07/2014 0325   PROTEINUR NEGATIVE 12/07/2014 0325   UROBILINOGEN 1.0 12/07/2014 0325   NITRITE  NEGATIVE 12/07/2014 0325   LEUKOCYTESUR NEGATIVE 12/07/2014 0325    Sepsis Labs: Lactic Acid, Venous    Component Value Date/Time   LATICACIDVEN 1.2 07/23/2021 1546    MICROBIOLOGY: Recent Results (from the past 240 hour(s))  Resp Panel by RT-PCR (Flu A&B, Covid) Nasopharyngeal Swab     Status: None   Collection Time: 07/23/21  3:46 PM   Specimen: Nasopharyngeal Swab; Nasopharyngeal(NP) swabs in vial transport medium  Result Value Ref Range Status   SARS Coronavirus 2 by RT PCR NEGATIVE NEGATIVE Final    Comment: (NOTE) SARS-CoV-2 target nucleic acids are NOT DETECTED.  The SARS-CoV-2 RNA is generally detectable in upper respiratory specimens during the acute phase of infection. The lowest concentration of SARS-CoV-2 viral copies this assay can detect is 138 copies/mL. A negative result does not preclude SARS-Cov-2 infection and should not be used as the sole basis for treatment or other patient management decisions. A negative result may occur with  improper specimen collection/handling, submission of specimen other than nasopharyngeal swab, presence of viral mutation(s) within the areas targeted by this assay, and inadequate number of viral copies(<138 copies/mL). A  negative result must be combined with clinical observations, patient history, and epidemiological information. The expected result is Negative.  Fact Sheet for Patients:  BloggerCourse.com  Fact Sheet for Healthcare Providers:  SeriousBroker.it  This test is no t yet approved or cleared by the [place] FDA and  has been authorized for detection and/or diagnosis of SARS-CoV-2 by FDA under an Emergency Use Authorization (EUA). This EUA will remain  in effect (meaning this test can be used) for the duration of the COVID-19 declaration under Section 564(b)(1) of the Act, 21 U.S.C.section 360bbb-3(b)(1), unless the authorization is terminated  or revoked sooner.       Influenza A by PCR NEGATIVE NEGATIVE Final   Influenza B by PCR NEGATIVE NEGATIVE Final    Comment: (NOTE) The Xpert Xpress SARS-CoV-2/FLU/RSV plus assay is intended as an aid in the diagnosis of influenza from Nasopharyngeal swab specimens and should not be used as a sole basis for treatment. Nasal washings and aspirates are unacceptable for Xpert Xpress SARS-CoV-2/FLU/RSV testing.  Fact Sheet for Patients: BloggerCourse.com  Fact Sheet for Healthcare Providers: SeriousBroker.it  This test is not yet approved or cleared by the [place] FDA and has been authorized for detection and/or diagnosis of SARS-CoV-2 by FDA under an Emergency Use Authorization (EUA). This EUA will remain in effect (meaning this test can be used) for the duration of the COVID-19 declaration under Section 564(b)(1) of the Act, 21 U.S.C. section 360bbb-3(b)(1), unless the authorization is terminated or revoked.  Performed at [facility], [address], [place], [place] [address]   Aerobic Culture w Gram Stain (superficial specimen)     Status: None (Preliminary result)   Collection Time: 07/23/21  4:20 PM   Specimen: Wound   Result Value Ref Range Status   Specimen Description WOUND  Final   Special Requests RIGHT FOOT  Final   Gram Stain   Final    RARE WBC PRESENT, PREDOMINANTLY MONONUCLEAR FEW GRAM POSITIVE COCCI IN PAIRS AND CHAINS Performed at [facility], [address], [place], [place] [address]    Culture PENDING  Incomplete   Report Status PENDING  Incomplete  Aerobic Culture w Gram Stain (superficial specimen)     Status: None (Preliminary result)   Collection Time: 07/23/21  4:20 PM   Specimen: Wound  Result Value Ref Range Status  Specimen Description WOUND  Final   Special Requests LEFT FOOT  Final   Gram Stain   Final    NO WBC SEEN MODERATE GRAM POSITIVE COCCI FEW GRAM POSITIVE RODS Performed at [facility], [address], [place], [place] [address]    Culture PENDING  Incomplete   Report Status PENDING  Incomplete    RADIOLOGY STUDIES/RESULTS: MR FOOT LEFT WO CONTRAST  Result Date: 07/24/2021 CLINICAL DATA:  Diabetic foot ulcer EXAM: MRI OF THE LEFT FOOT WITHOUT CONTRAST TECHNIQUE: Multiplanar, multisequence MR imaging of the left forefoot was performed. No intravenous contrast was administered. COMPARISON:  X-ray 07/23/2021, MRI 11/08/2017 FINDINGS: Bones/Joint/Cartilage Status post second ray resection to the level of the second metatarsal neck. There is also been a osteotomy of the third metatarsal head. Bone marrow edema and intermediate-low T1 signal changes within the distal second metatarsal at the resection site concerning for osteomyelitis. Marrow edema at the base of the third toe proximal phalanx, also with intermediate T1 signal is suspicious for osteomyelitis. Minimal marrow edema within the distal aspect of the third metatarsal with preservation of the T1 marrow signal. First TMT joint effusion with prominent bone marrow edema and low T1 signal changes within the first metatarsal base extending to the level of the proximal diaphysis as well as within the  distal aspect of the medial cuneiform. Subchondral cysts or erosions along both sides of joint. Findings are concerning for septic arthritis with osteomyelitis. Similar findings are present at the fourth tarsometatarsal joint with suspicion of septic arthritis and osteomyelitis. No acute fracture. No dislocation. Degenerative changes are present within the forefoot. Ligaments Lisfranc ligament appears grossly intact. No evidence of acute collateral ligament injury. Muscles and Tendons Signal changes within the foot musculature suggest a combination of chronic denervation and myositis. Post amputation changes within the second toe tendons. No large tenosynovial fluid collection. Soft tissues Ulceration at the plantar aspect of the foot underlying the level of the third metatarsal head. Diffuse soft tissue edema. Focal 5 mm fluid collection underlying the plantar aspect of the residual distal second metatarsal may reflect a small abscess (series 4, image 23). IMPRESSION: 1. Ulceration at the plantar aspect of the foot underlying the level of the third metatarsal head. Evidence of osteomyelitis involving the base of the third toe proximal phalanx. 2. Signal changes within the distal second metatarsal at the resection site are also concerning for osteomyelitis. 3. First TMT joint effusion with prominent bone marrow edema within the first metatarsal base extending to the level of the proximal diaphysis as well as within the distal aspect of the medial cuneiform. Although this could be degenerative in etiology, findings are concerning for septic arthritis with osteomyelitis. 4. Similar findings are present at the fourth tarsometatarsal joint where there is also suspicion for septic arthritis and osteomyelitis. 5. Focal 5 mm fluid collection underlying the plantar aspect of the residual distal second metatarsal may reflect a small abscess. 6. Signal changes within the foot musculature suggest a combination of chronic  denervation and myositis. These results will be called to the ordering clinician or representative by the Radiologist Assistant, and communication documented in the PACS or [employer]. Electronically Signed   By: Duanne Guess D.O.   On: 07/24/2021 08:22   DG Foot Complete Left  Result Date: 07/23/2021 CLINICAL DATA:  Diabetic foot ulcers. EXAM: LEFT FOOT - COMPLETE 3+ VIEW COMPARISON:  Foot radiograph 08/07/2018 FINDINGS: Osteotomy of the second ray unchanged from prior. There is new loss  of the third ray metacarpal head which may be postsurgical. There is rarefaction of the bone at the base of the proximal phalanx third digit along the medial border. Plantar ulceration the level of the metatarsal heads seen on lateral projection. No foreign body identified IMPRESSION: 1. Concern for osteomyelitis at the base of the proximal phalanx third digit. 2. Probable osteotomy of the third ray RIGHT metatarsal. Prior second ray osteotomy. 3. Soft tissue ulceration along the plantar surface of the metatarsal head row. No foreign body. Electronically Signed   By: Genevive Bi M.D.   On: 07/23/2021 15:10   DG Foot Complete Right  Result Date: 07/23/2021 CLINICAL DATA:  Diabetic foot ulcer.  Evaluation for osteomyelitis EXAM: RIGHT FOOT COMPLETE - 3+ VIEW COMPARISON:  None. FINDINGS: No cortical erosion or periosteal reaction concerning for osteomyelitis. Prominent plantar calcaneal spurring. No acute fracture or dislocation. Deep skin wound about the plantar aspect of the distal foot. IMPRESSION: No radiographic evidence of osteomyelitis. If there is a deep skin wound and clinical concern for osteomyelitis, MRI examination could be obtained for further evaluation. Electronically Signed   By: Larose Hires D.O.   On: 07/23/2021 15:04   VAS [place] LOWER EXTREMITY VENOUS (DVT)  Result Date: 07/24/2021  Lower Venous DVT Study Patient Name:  Cody Pena  Date of Exam:   07/24/2021 Medical Rec #: [id number]         Accession #:    [id number] Date of Birth: Nov 09, 1960       Patient Gender: M Patient Age:   20 years Exam Location:  [facility] Procedure:      VAS [place] LOWER EXTREMITY VENOUS (DVT) Referring Phys: Orland Mustard --------------------------------------------------------------------------------  Indications: Edema, and gangrenous ulcer of right foot.  Comparison Study: No prior study on file Performing Technologist: Sherren Kerns RVS  Examination Guidelines: A complete evaluation includes B-mode imaging, spectral Doppler, color Doppler, and power Doppler as needed of all accessible portions of each vessel. Bilateral testing is considered an integral part of a complete examination. Limited examinations for reoccurring indications may be performed as noted. The reflux portion of the exam is performed with the patient in reverse Trendelenburg.  +---------+---------------+---------+-----------+----------+--------------+ RIGHT    CompressibilityPhasicitySpontaneityPropertiesThrombus Aging +---------+---------------+---------+-----------+----------+--------------+ CFV      Full           Yes      Yes                                 +---------+---------------+---------+-----------+----------+--------------+ SFJ      Full                                                        +---------+---------------+---------+-----------+----------+--------------+ FV Prox  Full                                                        +---------+---------------+---------+-----------+----------+--------------+ FV Mid   Full                                                        +---------+---------------+---------+-----------+----------+--------------+  FV DistalFull                                                        +---------+---------------+---------+-----------+----------+--------------+ PFV      Full                                                         +---------+---------------+---------+-----------+----------+--------------+ POP      Full           Yes      Yes                                 +---------+---------------+---------+-----------+----------+--------------+ PTV      Full                                                        +---------+---------------+---------+-----------+----------+--------------+ PERO     Full                                                        +---------+---------------+---------+-----------+----------+--------------+   +----+---------------+---------+-----------+----------+--------------+ LEFTCompressibilityPhasicitySpontaneityPropertiesThrombus Aging +----+---------------+---------+-----------+----------+--------------+ CFV Full           Yes      Yes                                 +----+---------------+---------+-----------+----------+--------------+     Summary: RIGHT: - There is no evidence of deep vein thrombosis in the lower extremity.  - No cystic structure found in the popliteal fossa. - Ultrasound characteristics of enlarged lymph nodes are noted in the groin.  LEFT: - No evidence of common femoral vein obstruction.  *See table(s) above for measurements and observations.    Preliminary      LOS: 0 days   Jeoffrey Massed, MD  [facility]    To contact the attending provider between 7A-7P or the covering provider during after hours 7P-7A, please log into the web site [email or web address] and access using universal [place] password for that web site. If you do not have the password, please call the hospital operator.  07/24/2021, 9:51 AM

## 2021-07-24 NOTE — Progress Notes (Signed)
VASCULAR LAB    ABIs have been performed.  See CV proc for preliminary results.   Ednah Hammock, RVT 07/24/2021, 9:45 AM

## 2021-07-24 NOTE — TOC Benefit Eligibility Note (Signed)
Patient Product/process development scientist completed.    The patient is currently admitted and upon discharge could be taking Lantus Pens.  The current 30 day co-pay is, $54.27.   The patient is currently admitted and upon discharge could be taking Social research officer, government.  The current 30 day co-pay is, $353.34.   The patient is currently admitted and upon discharge could be taking Semglee Pens.  Non Formulary  The patient is currently admitted and upon discharge could be taking [place] Pens.  Non Formulary  The patient is insured through [employer]     Roland Earl, CPhT Pharmacy Patient Advocate Specialist [facility] Patient Advocate Team Direct Number: [phone]  Fax: [phone]

## 2021-07-24 NOTE — Progress Notes (Signed)
VASCULAR LAB    Right lower extremity venous duplex has been performed.  See CV proc for preliminary results.   Kriti Katayama, RVT 07/24/2021, 9:45 AM

## 2021-07-24 NOTE — Discharge Instructions (Signed)

## 2021-07-25 DIAGNOSIS — Z794 Long term (current) use of insulin: Secondary | ICD-10-CM

## 2021-07-25 DIAGNOSIS — I10 Essential (primary) hypertension: Secondary | ICD-10-CM

## 2021-07-25 DIAGNOSIS — Z7982 Long term (current) use of aspirin: Secondary | ICD-10-CM

## 2021-07-25 DIAGNOSIS — I70235 Atherosclerosis of native arteries of right leg with ulceration of other part of foot: Secondary | ICD-10-CM

## 2021-07-25 DIAGNOSIS — E1151 Type 2 diabetes mellitus with diabetic peripheral angiopathy without gangrene: Secondary | ICD-10-CM

## 2021-07-25 DIAGNOSIS — I70245 Atherosclerosis of native arteries of left leg with ulceration of other part of foot: Secondary | ICD-10-CM

## 2021-07-25 DIAGNOSIS — I739 Peripheral vascular disease, unspecified: Secondary | ICD-10-CM

## 2021-07-25 DIAGNOSIS — M86271 Subacute osteomyelitis, right ankle and foot: Secondary | ICD-10-CM

## 2021-07-25 DIAGNOSIS — Z79899 Other long term (current) drug therapy: Secondary | ICD-10-CM

## 2021-07-25 DIAGNOSIS — F17210 Nicotine dependence, cigarettes, uncomplicated: Secondary | ICD-10-CM

## 2021-07-25 LAB — GLUCOSE, CAPILLARY
Glucose-Capillary: 198 mg/dL — ABNORMAL HIGH (ref 70–99)
Glucose-Capillary: 216 mg/dL — ABNORMAL HIGH (ref 70–99)
Glucose-Capillary: 234 mg/dL — ABNORMAL HIGH (ref 70–99)
Glucose-Capillary: 187 mg/dL — ABNORMAL HIGH (ref 70–99)

## 2021-07-25 LAB — AEROBIC CULTURE W GRAM STAIN (SUPERFICIAL SPECIMEN): Gram Stain: NONE SEEN

## 2021-07-25 LAB — CBC
HCT: 47.4 % (ref 39.0–52.0)
Hemoglobin: 15.9 g/dL (ref 13.0–17.0)
MCH: 32.3 pg (ref 26.0–34.0)
MCHC: 33.5 g/dL (ref 30.0–36.0)
MCV: 96.1 fL (ref 80.0–100.0)
Platelets: 140 10*3/uL — ABNORMAL LOW (ref 150–400)
RBC: 4.93 MIL/uL (ref 4.22–5.81)
RDW: 11.9 % (ref 11.5–15.5)
WBC: 9 10*3/uL (ref 4.0–10.5)
nRBC: 0 % (ref 0.0–0.2)

## 2021-07-25 LAB — BASIC METABOLIC PANEL
Anion gap: 8 (ref 5–15)
BUN: 16 mg/dL (ref 8–23)
CO2: 28 mmol/L (ref 22–32)
Calcium: 8.7 mg/dL — ABNORMAL LOW (ref 8.9–10.3)
Chloride: 98 mmol/L (ref 98–111)
Creatinine, Ser: 1.12 mg/dL (ref 0.61–1.24)
GFR, Estimated: >60 mL/min (ref >60)
Glucose, Bld: 223 mg/dL — ABNORMAL HIGH (ref 70–99)
Potassium: 4 mmol/L (ref 3.5–5.1)
Sodium: 134 mmol/L — ABNORMAL LOW (ref 135–145)

## 2021-07-25 LAB — VANCOMYCIN, PEAK: Vancomycin Pk: 32 ug/mL (ref 30–40)

## 2021-07-25 NOTE — Consult Note (Addendum)
[facility] [place] °CONSULT NOTE ° °I have interviewed the patient and examined the patient. I agree with the findings by the PA. ° °This patient developed foot wounds about a month ago after he stepped on something.  The wound on the right is more significant as documented in the photographs below.  Prior to this, he denies any history of claudication or rest pain.  His risk factors for peripheral vascular disease include diabetes, hypertension, and tobacco use.  He smokes over a pack per day and has been smoking since he was 60 years old.  On my exam, on the left he has a brisk, monophasic posterior tibial signal with a monophasic peroneal signal.  I cannot get an anterior tibial signal.  On the right he has a barely biphasic, brisk posterior tibial signal with the Doppler and a dampened monophasic anterior tibial signal. 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  He has undergone a previous complex tibial intervention on the left by interventional radiology in 2019.  I have recommended that we proceed with arteriography and given the extent of the wound on the right we would focus on the right leg.  He is on aspirin, Plavix, and a statin.  We have discussed the importance of tobacco cessation.  His procedure is scheduled for Friday with Dr. Hawken.  His renal function is normal. ° °I have reviewed with the patient the indications for arteriography. In addition, I have reviewed the potential complications of arteriography including but not limited to: Bleeding, arterial injury, arterial thrombosis, dye action, renal insufficiency, or other unpredictable medical problems. I have explained to the patient that if we find disease amenable to angioplasty we could potentially address this at the same time. I have discussed the potential complications  of angioplasty and stenting, including but not limited to: Bleeding, arterial thrombosis, arterial injury, dissection, or the need for surgical intervention. ° °Recommend the following which can slow the progression of atherosclerosis and reduce the risk of major adverse cardiac / limb events:  °Aspirin 81mg PO QD.  °Atorvastatin 40-80mg PO QD (or other "high intensity" statin therapy). °Complete cessation from all tobacco products. °Blood glucose control with goal A1c < 7%. °Blood pressure control with goal blood pressure < 140/90 mmHg. °Lipid reduction therapy with goal LDL-C <100 mg/dL (<70 if symptomatic from PAD).  ° °Chris Christel Bai, MD °10:06 AM ° ° ° °MRN : [id number] ° °Reason for Consult: right foot non healing wound °Referring Physician: Dr. Duda ° °History of Present Illness: 60 y/o with non healing  ulceration beneath the second metatarsal head B feet with necrotic soft tissue and foul-smelling drainage.  He states the wounds started 3-4 weeks ago.  He thinks he may have stepped on something, then developed blisters and now non healing open wounds. ° He denise symptoms of claudication and rest pain. °  °  °He has a history of angiogram with left LE ULTRASOUND GUIDED ACCESS LEFT COMMON FEMORAL ARTERY IMAGE GUIDED ACCESS LEFT DORSALIS PEDIS BALLOON ANGIOPLASTY OF ANTERIOR TIBIAL ARTERY BALLOON ANGIOPLASTY DORSALIS PEDIS ARTERY BALLOON ANGIOPLASTY OF LATERAL PLANTAR ARTERY AND DEEP PERFORATING ARTERY TREATMENT OF PROXIMAL ANTERIOR TIBIAL ARTERY WITH DRUG-ELUTING BALLOON TECHNOLOGY DEPLOYMENT OF ANGIO-SEAL FOR HEMOSTASIS 2019 at another facility. °  ° Past medical history of DM, HTN and PAD left LE.   ° ° °Current Facility-Administered Medications  °  Medication Dose Route Frequency Provider Last Rate Last Admin  ° 0.9 %  sodium chloride infusion  250 mL Intravenous PRN Wolfe, Allison, MD      ° acetaminophen (TYLENOL) tablet 650 mg  650 mg Oral Q6H PRN Wolfe, Allison, MD      ° Or  ° acetaminophen (TYLENOL)  suppository 650 mg  650 mg Rectal Q6H PRN Wolfe, Allison, MD      ° atorvastatin (LIPITOR) tablet 80 mg  80 mg Oral Daily Wolfe, Allison, MD   80 mg at 07/25/21 0820  ° citalopram (CELEXA) tablet 40 mg  40 mg Oral Daily Wolfe, Allison, MD   40 mg at 07/25/21 0820  ° clonazePAM (KLONOPIN) tablet 0.5 mg  0.5 mg Oral Daily PRN Wolfe, Allison, MD      ° clopidogrel (PLAVIX) tablet 75 mg  75 mg Oral Daily Wolfe, Allison, MD   75 mg at 07/25/21 0820  ° enoxaparin (LOVENOX) injection 40 mg  40 mg Subcutaneous Q24H Ghimire, Shanker M, MD   40 mg at 07/25/21 0821  ° gabapentin (NEURONTIN) tablet 600 mg  600 mg Oral TID Wolfe, Allison, MD   600 mg at 07/25/21 0820  ° insulin aspart (novoLOG) injection 0-15 Units  0-15 Units Subcutaneous TID WC Wolfe, Allison, MD   3 Units at 07/25/21 0826  ° insulin aspart (novoLOG) injection 4 Units  4 Units Subcutaneous TID WC Ghimire, Shanker M, MD   4 Units at 07/25/21 0827  ° insulin glargine-yfgn (SEMGLEE) injection 20 Units  20 Units Subcutaneous QHS Ghimire, Shanker M, MD   20 Units at 07/24/21 2035  ° morphine 2 MG/ML injection 1 mg  1 mg Intravenous Q3H PRN Wolfe, Allison, MD   1 mg at 07/24/21 1627  ° multivitamin with minerals tablet 1 tablet  1 tablet Oral Daily Wolfe, Allison, MD   1 tablet at 07/25/21 0821  ° nicotine (NICODERM CQ - dosed in mg/24 hours) patch 21 mg  21 mg Transdermal Daily Wolfe, Allison, MD   21 mg at 07/25/21 0823  ° nutrition supplement (JUVEN) (JUVEN) powder packet 1 packet  1 packet Oral BID BM Ghimire, Shanker M, MD   1 packet at 07/25/21 0821  ° oxyCODONE-acetaminophen (PERCOCET/ROXICET) 5-325 MG per tablet 1 tablet  1 tablet Oral Q4H PRN Wolfe, Allison, MD   1 tablet at 07/25/21 0826  ° And  ° oxyCODONE (Oxy IR/ROXICODONE) immediate release tablet 5 mg  5 mg Oral Q4H PRN Wolfe, Allison, MD   5 mg at 07/25/21 0826  ° piperacillin-tazobactam (ZOSYN) IVPB 3.375 g  3.375 g Intravenous Q8H Mancheril, Benjamin G, RPH 12.5 mL/hr at 07/25/21 0822 3.375 g at  07/25/21 0822  ° senna-docusate (Senokot-S) tablet 1 tablet  1 tablet Oral QHS PRN Wolfe, Allison, MD      ° sodium chloride flush (NS) 0.9 % injection 3 mL  3 mL Intravenous Q12H Wolfe, Allison, MD   3 mL at 07/25/21 0827  ° sodium chloride flush (NS) 0.9 % injection 3 mL  3 mL Intravenous PRN Wolfe, Allison, MD      ° vancomycin (VANCOREADY) IVPB 1250 mg/250 mL  1,250 mg Intravenous Q12H Mancheril, Benjamin G, RPH 166.7 mL/hr at 07/25/21 0555 1,250 mg at 07/25/21 0555  ° ° °Pt meds include: °Statin :Yes °Betablocker: No °ASA: No °Other anticoagulants/antiplatelets: Plavix ° °Past Medical History:  °Diagnosis Date  ° Diabetes mellitus without complication (HCC)   ° Hypertension   ° ° °Past Surgical History:  °Procedure Laterality   Date  ° AMPUTATION Left 11/15/2017  ° Procedure: LEFT FOOT 2ND RAY AMPUTATION;  Surgeon: Duda, Marcus V, MD;  Location: [facility] OR;  Service: Orthopedics;  Laterality: Left;  ° CERVICAL FUSION    ° IR ANGIOGRAM EXTREMITY LEFT  11/14/2017  ° IR ANGIOGRAM SELECTIVE EACH ADDITIONAL VESSEL  11/14/2017  ° IR ANGIOGRAM SELECTIVE EACH ADDITIONAL VESSEL  11/14/2017  ° IR TIB-PERO ART PTA MOD SED  11/14/2017  ° IR TIB-PERO ART UNI PTA EA ADD VESSEL MOD SED  11/14/2017  ° IR US GUIDE VASC ACCESS LEFT  11/14/2017  ° ° °Social History °Social History  ° °Tobacco Use  ° Smoking status: Every Day  ° Smokeless tobacco: Never  °Substance Use Topics  ° Alcohol use: No  ° Drug use: No  ° ° °Family History °No family history on file. ° °No Known Allergies ° ° °REVIEW OF SYSTEMS ° °General: [ ] Weight loss, [ ] Fever, [ ] chills °Neurologic: [ ] Dizziness, [ ] Blackouts, [ ] Seizure °[ ] Stroke, [ ] "Mini stroke", [ ] Slurred speech, [ ] Temporary blindness; [ ] weakness in arms or legs, [ ] Hoarseness [ ] Dysphagia °Cardiac: [ ] Chest pain/pressure, [ ] Shortness of breath at rest [ ] Shortness of breath with exertion, [ ] Atrial fibrillation or irregular heartbeat  °Vascular: [ ] Pain in legs with walking, [ ] Pain in legs at  rest, [ ] Pain in legs at night, ° [x ] Non-healing ulcer, [ ] Blood clot in vein/DVT,   °Pulmonary: [ ] Home oxygen, [ ] Productive cough, [ ] Coughing up blood, [ ] Asthma, ° [ ] Wheezing [ ] COPD °Musculoskeletal:  [ ] Arthritis, [ ] Low back pain, [ ] Joint pain °Hematologic: [ ] Easy Bruising, [ ] Anemia; [ ] Hepatitis °Gastrointestinal: [ ] Blood in stool, [ ] Gastroesophageal Reflux/heartburn, °Urinary: [ ] chronic Kidney disease, [ ] on HD - [ ] MWF or [ ] TTHS, [ ] Burning with urination, [ ] Difficulty urinating °Skin: [ ] Rashes, [ ] Wounds °Psychological: [ ] Anxiety, [ ] Depression ° °Physical Examination °Vitals:  ° 07/24/21 0736 07/24/21 1619 07/24/21 2018 07/25/21 0820  °BP: 116/74 122/73 126/71 114/76  °Pulse: 76 78 77 80  °Resp: 16 16 18 17  °Temp: 98.3 °F (36.8 °C) 99 °F (37.2 °C) 98.7 °F (37.1 °C) 97.8 °F (36.6 °C)  °TempSrc: Oral Oral Oral Oral  °SpO2: 93% 92% 94% 95%  °Weight:      °Height:      ° °Body mass index is 28.8 kg/m². ° °General:  WDWN in NAD °Gait: Normal °HENT: WNL °Eyes: Pupils equal °Pulmonary: normal non-labored breathing , without Rales, rhonchi,  wheezing °Cardiac: RRR, without  Murmurs, rubs or gallops; °No carotid bruits °Abdomen: soft, NT, no masses °Skin: no rashes, ulcers noted;  no Gangrene , no cellulitis; no open wounds;  ° ° ° ° °Vascular Exam/Pulses:palpable Radial, femoral pulses B  ° ° °Musculoskeletal: no muscle wasting or atrophy; no edema  °Neurologic: A&O X 3; Appropriate Affect ;  °SENSATION: normal; °MOTOR FUNCTION: 5/5 Symmetric °Speech is fluent/normal ° ° °Significant Diagnostic Studies: °CBC °Lab Results  °Component Value Date  ° WBC 9.0 07/25/2021  ° HGB 15.9 07/25/2021  ° HCT 47.4 07/25/2021  ° MCV 96.1 07/25/2021  ° PLT 140 (L) 07/25/2021  ° ° °BMET °   °Component Value Date/Time  ° NA 134 (L) 07/25/2021 0245  ° K 4.0   07/25/2021 0245  ° CL 98 07/25/2021 0245  ° CO2 28 07/25/2021 0245  ° GLUCOSE 223 (H) 07/25/2021 0245  ° BUN 16 07/25/2021 0245  °  CREATININE 1.12 07/25/2021 0245  ° CALCIUM 8.7 (L) 07/25/2021 0245  ° GFRNONAA >60 07/25/2021 0245  ° GFRAA >60 11/14/2017 0936  ° °Estimated Creatinine Clearance: 89.5 mL/min (by C-G formula based on SCr of 1.12 mg/dL). ° °COAG °Lab Results  °Component Value Date  ° INR 1.05 11/10/2017  ° INR 1.08 11/08/2017  ° ° ° °Non-Invasive Vascular Imaging:  ° °   °ABI Findings:  °+---------+------------------+-----+---------+--------+  ° Right     Rt Pressure (mmHg) Index Waveform  Comment    °+---------+------------------+-----+---------+--------+  ° Brachial  135                      triphasic            °+---------+------------------+-----+---------+--------+  ° PTA       93                 0.67  biphasic             °+---------+------------------+-----+---------+--------+  ° DP        91                 0.65  biphasic             °+---------+------------------+-----+---------+--------+  ° Great Toe 56                 0.40                       °+---------+------------------+-----+---------+--------+  ° °+---------+------------------+-----+---------+-------+  ° Left      Lt Pressure (mmHg) Index Waveform  Comment   °+---------+------------------+-----+---------+-------+  ° Brachial  139                      triphasic           °+---------+------------------+-----+---------+-------+  ° PTA       100                0.72  biphasic            °+---------+------------------+-----+---------+-------+  ° DP        105                0.76  biphasic            °+---------+------------------+-----+---------+-------+  ° Great Toe 80                 0.58                      °+---------+------------------+-----+---------+-------+  ° °+-------+-----------+-----------+------------+------------+  ° ABI/TBI Today's ABI Today's TBI Previous ABI Previous TBI   °+-------+-----------+-----------+------------+------------+  ° Right   0.67        0.40                                     °+-------+-----------+-----------+------------+------------+  ° Left    0.76        058                                     °+-------+-----------+-----------+------------+------------+  ° ° °   °  Summary:  °Right: Resting right ankle-brachial index indicates moderate right lower  °extremity arterial disease. The right toe-brachial index is abnormal.  ° °Left: Resting left ankle-brachial index indicates moderate left lower  °extremity arterial disease. The left toe-brachial index is abnormal.  ° °Venous study °RIGHT:  °- There is no evidence of deep vein thrombosis in the lower extremity. ° °ASSESSMENT/PLAN:  °PAD with B plantar foot wounds °Previous angiogram with angioplasty of tibial arteries in 2019 °His ABI's indicate infrainguinal disease.  °We will plan and angiogram with possible intervention to maximize his inflow and assist with wound healing. ° ° °Emma Maureen Collins °07/25/2021 °9:43 AM ° °

## 2021-07-25 NOTE — Consult Note (Signed)
ORTHOPAEDIC CONSULTATION  REQUESTING PHYSICIAN: Maretta Bees, MD  Chief Complaint: Necrotic ulcer plantar aspect right foot.  HPI: Cody Pena is a 60 y.o. male who presents with acute necrotic ulcer plantar aspect right foot.  Patient denies any recent trauma.  Past Medical History:  Diagnosis Date   Diabetes mellitus without complication (HCC)    Hypertension    Past Surgical History:  Procedure Laterality Date   AMPUTATION Left 11/15/2017   Procedure: LEFT FOOT 2ND RAY AMPUTATION;  Surgeon: Nadara Mustard, MD;  Location: [facility] OR;  Service: Orthopedics;  Laterality: Left;   CERVICAL FUSION     IR ANGIOGRAM EXTREMITY LEFT  11/14/2017   IR ANGIOGRAM SELECTIVE EACH ADDITIONAL VESSEL  11/14/2017   IR ANGIOGRAM SELECTIVE EACH ADDITIONAL VESSEL  11/14/2017   IR TIB-PERO ART PTA MOD SED  11/14/2017   IR TIB-PERO ART UNI PTA EA ADD VESSEL MOD SED  11/14/2017   IR US GUIDE VASC ACCESS LEFT  11/14/2017   Social History   Socioeconomic History   Marital status: Married    Spouse name: Not on file   Number of children: Not on file   Years of education: Not on file   Highest education level: Not on file  Occupational History   Not on file  Tobacco Use   Smoking status: Every Day   Smokeless tobacco: Never  Substance and Sexual Activity   Alcohol use: No   Drug use: No   Sexual activity: Not on file  Other Topics Concern   Not on file  Social History Narrative   Not on file   Social Determinants of Health   Financial Resource Strain: Not on file  Food Insecurity: Not on file  Transportation Needs: Not on file  Physical Activity: Not on file  Stress: Not on file  Social Connections: Not on file   No family history on file. - negative except otherwise stated in the family history section No Known Allergies Prior to Admission medications   Medication Sig Start Date End Date Taking? Authorizing Provider  atorvastatin (LIPITOR) 80 MG tablet Take 80 mg by mouth daily.   Yes  [provider]  citalopram (CELEXA) 40 MG tablet Take 40 mg by mouth daily.   Yes [provider]  clonazePAM (KLONOPIN) 0.5 MG tablet Take 0.5 mg by mouth daily as needed. 06/30/21  Yes [provider]  clopidogrel (PLAVIX) 75 MG tablet Take 75 mg by mouth daily. 07/13/21  Yes [provider]  gabapentin (NEURONTIN) 600 MG tablet Take 600 mg by mouth 3 (three) times daily.   Yes [provider]  Multiple Vitamin (MULTIVITAMIN WITH MINERALS) TABS tablet Take 1 tablet by mouth daily. 11/17/17  Yes Hall, Carole N, DO  nicotine (NICODERM CQ - DOSED IN MG/24 HOURS) 21 mg/24hr patch Place 1 patch (21 mg total) onto the skin daily. 11/17/17  Yes Darlin Drop, DO  oxyCODONE-acetaminophen (PERCOCET) 10-325 MG tablet Take 1 tablet by mouth every 4 (four) hours as needed for pain. May take 1 tablet 06/30/21  Yes [provider]  Vitamin D, Ergocalciferol, (DRISDOL) 50000 units CAPS capsule Take 50,000 Units by mouth every 7 (seven) days.   Yes [provider]  Dulaglutide (TRULICITY [place]) Inject 1 Device into the skin every 7 (seven) days.  Patient not taking: Reported on 07/23/2021    [provider]  Insulin Glargine (BASAGLAR KWIKPEN [place]) Inject 80 Units into the skin at bedtime.  Patient not taking: Reported  on 07/23/2021    [provider]  pentoxifylline (TRENTAL) 400 MG CR tablet Take 1 tablet (400 mg total) by mouth 3 (three) times daily with meals. Patient not taking: Reported on 07/23/2021 12/11/17   Newt Minion, MD   MR FOOT RIGHT WO CONTRAST  Result Date: 07/24/2021 CLINICAL DATA:  Foot swelling, diabetic, osteomyelitis suspected, xray done EXAM: MRI OF THE RIGHT FOREFOOT WITHOUT CONTRAST TECHNIQUE: Multiplanar, multisequence MR imaging of the right forefoot was performed. No intravenous contrast was administered. COMPARISON:  Right foot radiograph 07/23/2021 FINDINGS: Bones/Joint/Cartilage There is no significant  marrow signal alteration. The cortex is intact. Ligaments Intact Lisfranc ligament.  Intact MTP collateral ligaments. Muscles and Tendons Diffuse intramuscular edema and mild atrophy in the foot, as is commonly seen in diabetics. Soft tissues Diffuse soft tissue swelling dorsally. There is a plantar soft tissue ulcer along the medial forefoot. Ill-defined fluid and edema adjacent to the ulcer in the medial forefoot (axial T2 image 35, sagittal STIR image 26. IMPRESSION: Plantar soft tissue ulcer along the medial forefoot. No evidence of underlying osteomyelitis. Ill-defined fluid and edema adjacent to the ulcer, favored to be localized inflammatory change. Developing phlegmon/abscess is possible, correlate with drainage. Electronically Signed   By: Maurine Simmering M.D.   On: 07/24/2021 15:13   MR FOOT LEFT WO CONTRAST  Result Date: 07/24/2021 CLINICAL DATA:  Diabetic foot ulcer EXAM: MRI OF THE LEFT FOOT WITHOUT CONTRAST TECHNIQUE: Multiplanar, multisequence MR imaging of the left forefoot was performed. No intravenous contrast was administered. COMPARISON:  X-ray 07/23/2021, MRI 11/08/2017 FINDINGS: Bones/Joint/Cartilage Status post second ray resection to the level of the second metatarsal neck. There is also been a osteotomy of the third metatarsal head. Bone marrow edema and intermediate-low T1 signal changes within the distal second metatarsal at the resection site concerning for osteomyelitis. Marrow edema at the base of the third toe proximal phalanx, also with intermediate T1 signal is suspicious for osteomyelitis. Minimal marrow edema within the distal aspect of the third metatarsal with preservation of the T1 marrow signal. First TMT joint effusion with prominent bone marrow edema and low T1 signal changes within the first metatarsal base extending to the level of the proximal diaphysis as well as within the distal aspect of the medial cuneiform. Subchondral cysts or erosions along both sides of joint.  Findings are concerning for septic arthritis with osteomyelitis. Similar findings are present at the fourth tarsometatarsal joint with suspicion of septic arthritis and osteomyelitis. No acute fracture. No dislocation. Degenerative changes are present within the forefoot. Ligaments Lisfranc ligament appears grossly intact. No evidence of acute collateral ligament injury. Muscles and Tendons Signal changes within the foot musculature suggest a combination of chronic denervation and myositis. Post amputation changes within the second toe tendons. No large tenosynovial fluid collection. Soft tissues Ulceration at the plantar aspect of the foot underlying the level of the third metatarsal head. Diffuse soft tissue edema. Focal 5 mm fluid collection underlying the plantar aspect of the residual distal second metatarsal may reflect a small abscess (series 4, image 23). IMPRESSION: 1. Ulceration at the plantar aspect of the foot underlying the level of the third metatarsal head. Evidence of osteomyelitis involving the base of the third toe proximal phalanx. 2. Signal changes within the distal second metatarsal at the resection site are also concerning for osteomyelitis. 3. First TMT joint effusion with prominent bone marrow edema within the first metatarsal base extending to the level of the proximal diaphysis as well as within the  distal aspect of the medial cuneiform. Although this could be degenerative in etiology, findings are concerning for septic arthritis with osteomyelitis. 4. Similar findings are present at the fourth tarsometatarsal joint where there is also suspicion for septic arthritis and osteomyelitis. 5. Focal 5 mm fluid collection underlying the plantar aspect of the residual distal second metatarsal may reflect a small abscess. 6. Signal changes within the foot musculature suggest a combination of chronic denervation and myositis. These results will be called to the ordering clinician or representative by  the Radiologist Assistant, and communication documented in the PACS or [employer]. Electronically Signed   By: Davina Poke D.O.   On: 07/24/2021 08:22   DG Foot Complete Left  Result Date: 07/23/2021 CLINICAL DATA:  Diabetic foot ulcers. EXAM: LEFT FOOT - COMPLETE 3+ VIEW COMPARISON:  Foot radiograph 08/07/2018 FINDINGS: Osteotomy of the second ray unchanged from prior. There is new loss of the third ray metacarpal head which may be postsurgical. There is rarefaction of the bone at the base of the proximal phalanx third digit along the medial border. Plantar ulceration the level of the metatarsal heads seen on lateral projection. No foreign body identified IMPRESSION: 1. Concern for osteomyelitis at the base of the proximal phalanx third digit. 2. Probable osteotomy of the third ray RIGHT metatarsal. Prior second ray osteotomy. 3. Soft tissue ulceration along the plantar surface of the metatarsal head row. No foreign body. Electronically Signed   By: Suzy Bouchard M.D.   On: 07/23/2021 15:10   DG Foot Complete Right  Result Date: 07/23/2021 CLINICAL DATA:  Diabetic foot ulcer.  Evaluation for osteomyelitis EXAM: RIGHT FOOT COMPLETE - 3+ VIEW COMPARISON:  None. FINDINGS: No cortical erosion or periosteal reaction concerning for osteomyelitis. Prominent plantar calcaneal spurring. No acute fracture or dislocation. Deep skin wound about the plantar aspect of the distal foot. IMPRESSION: No radiographic evidence of osteomyelitis. If there is a deep skin wound and clinical concern for osteomyelitis, MRI examination could be obtained for further evaluation. Electronically Signed   By: Keane Police D.O.   On: 07/23/2021 15:04   VAS [place] ABI WITH/WO TBI  Result Date: 07/24/2021  LOWER EXTREMITY DOPPLER STUDY Patient Name:  Cody Pena  Date of Exam:   07/24/2021 Medical Rec #: [id number]        Accession #:    [id number] Date of Birth: 1960/09/30       Patient Gender: M Patient Age:   43 years  Exam Location:  [facility] Procedure:      VAS [place] ABI WITH/WO TBI Referring Phys: Oren Binet --------------------------------------------------------------------------------  Indications: Ulceration, gangrene, and peripheral artery disease. High Risk Factors: Hypertension, hyperlipidemia, Diabetes, current smoker. Other Factors: History of osteomyelitis, left foot 2nd ray amputation 11/15/2017.  Vascular Interventions: Left SFA atherectomy and stenting by interventional                         radiology in 03/2017. Comparison Study: No prior study Performing Technologist: Sharion Dove RVS  Examination Guidelines: A complete evaluation includes at minimum, Doppler waveform signals and systolic blood pressure reading at the level of bilateral brachial, anterior tibial, and posterior tibial arteries, when vessel segments are accessible. Bilateral testing is considered an integral part of a complete examination. Photoelectric Plethysmograph (PPG) waveforms and toe systolic pressure readings are included as required and additional duplex testing as needed. Limited examinations for reoccurring indications may be performed as noted.  ABI Findings: +---------+------------------+-----+---------+--------+  Right     Rt Pressure (mmHg) Index Waveform  Comment   +---------+------------------+-----+---------+--------+  Brachial  135                      triphasic           +---------+------------------+-----+---------+--------+  PTA       93                 0.67  biphasic            +---------+------------------+-----+---------+--------+  DP        91                 0.65  biphasic            +---------+------------------+-----+---------+--------+  Great Toe 56                 0.40                      +---------+------------------+-----+---------+--------+ +---------+------------------+-----+---------+-------+  Left      Lt Pressure (mmHg) Index Waveform  Comment   +---------+------------------+-----+---------+-------+  Brachial  139                      triphasic          +---------+------------------+-----+---------+-------+  PTA       100                0.72  biphasic           +---------+------------------+-----+---------+-------+  DP        105                0.76  biphasic           +---------+------------------+-----+---------+-------+  Great Toe 80                 0.58                     +---------+------------------+-----+---------+-------+ +-------+-----------+-----------+------------+------------+  ABI/TBI Today's ABI Today's TBI Previous ABI Previous TBI  +-------+-----------+-----------+------------+------------+  Right   0.67        0.40                                   +-------+-----------+-----------+------------+------------+  Left    0.76        058                                    +-------+-----------+-----------+------------+------------+  Summary: Right: Resting right ankle-brachial index indicates moderate right lower extremity arterial disease. The right toe-brachial index is abnormal. Left: Resting left ankle-brachial index indicates moderate left lower extremity arterial disease. The left toe-brachial index is abnormal.  *See table(s) above for measurements and observations.  Electronically signed by Servando Snare MD on 07/24/2021 at 4:20:06 PM.    Final    VAS [place] LOWER EXTREMITY VENOUS (DVT)  Result Date: 07/24/2021  Lower Venous DVT Study Patient Name:  Cody Pena  Date of Exam:   07/24/2021 Medical Rec #: [id number]        Accession #:    [id number] Date of Birth: 1960/08/31       Patient Gender: M Patient Age:   51 years Exam Location:  [facility] Procedure:  VAS [place] LOWER EXTREMITY VENOUS (DVT) Referring Phys: Orma Flaming --------------------------------------------------------------------------------  Indications: Edema, and gangrenous ulcer of right foot.  Comparison Study: No prior study on file Performing  Technologist: Sharion Dove RVS  Examination Guidelines: A complete evaluation includes B-mode imaging, spectral Doppler, color Doppler, and power Doppler as needed of all accessible portions of each vessel. Bilateral testing is considered an integral part of a complete examination. Limited examinations for reoccurring indications may be performed as noted. The reflux portion of the exam is performed with the patient in reverse Trendelenburg.  +---------+---------------+---------+-----------+----------+--------------+  RIGHT     Compressibility Phasicity Spontaneity Properties Thrombus Aging  +---------+---------------+---------+-----------+----------+--------------+  CFV       Full            Yes       Yes                                    +---------+---------------+---------+-----------+----------+--------------+  SFJ       Full                                                             +---------+---------------+---------+-----------+----------+--------------+  FV Prox   Full                                                             +---------+---------------+---------+-----------+----------+--------------+  FV Mid    Full                                                             +---------+---------------+---------+-----------+----------+--------------+  FV Distal Full                                                             +---------+---------------+---------+-----------+----------+--------------+  PFV       Full                                                             +---------+---------------+---------+-----------+----------+--------------+  POP       Full            Yes       Yes                                    +---------+---------------+---------+-----------+----------+--------------+  PTV       Full                                                             +---------+---------------+---------+-----------+----------+--------------+  PERO      Full                                                              +---------+---------------+---------+-----------+----------+--------------+   +----+---------------+---------+-----------+----------+--------------+  LEFT Compressibility Phasicity Spontaneity Properties Thrombus Aging  +----+---------------+---------+-----------+----------+--------------+  CFV  Full            Yes       Yes                                    +----+---------------+---------+-----------+----------+--------------+     Summary: RIGHT: - There is no evidence of deep vein thrombosis in the lower extremity.  - No cystic structure found in the popliteal fossa. - Ultrasound characteristics of enlarged lymph nodes are noted in the groin.  LEFT: - No evidence of common femoral vein obstruction. - Ultrasound characteristics of enlarged lymph nodes noted in the groin.  *See table(s) above for measurements and observations. Electronically signed by Servando Snare MD on 07/24/2021 at 4:19:44 PM.    Final    - pertinent xrays, CT, MRI studies were reviewed and independently interpreted  Positive ROS: All other systems have been reviewed and were otherwise negative with the exception of those mentioned in the HPI and as above.  Physical Exam: General: Alert, no acute distress Psychiatric: Patient is competent for consent with normal mood and affect Lymphatic: No axillary or cervical lymphadenopathy Cardiovascular: No pedal edema Respiratory: No cyanosis, no use of accessory musculature GI: No organomegaly, abdomen is soft and non-tender    Images:  @ENCIMAGES @  Labs:  Lab Results  Component Value Date   HGBA1C 12.5 (H) 07/23/2021   HGBA1C 7.2 (H) 11/10/2017   HGBA1C 8.1 (H) 12/07/2014   ESRSEDRATE 60 (H) 11/10/2017   ESRSEDRATE 20 (H) 12/07/2014   CRP 3.2 (H) 11/10/2017   CRP 7.0 (H) 12/07/2014   REPTSTATUS PENDING 07/23/2021   REPTSTATUS PENDING 07/23/2021   GRAMSTAIN  07/23/2021    RARE WBC PRESENT, PREDOMINANTLY MONONUCLEAR FEW GRAM POSITIVE COCCI IN PAIRS AND  CHAINS    GRAMSTAIN  07/23/2021    NO WBC SEEN MODERATE GRAM POSITIVE COCCI FEW GRAM POSITIVE RODS    CULT  07/23/2021    CULTURE REINCUBATED FOR BETTER GROWTH Performed at [facility], [place] [address], [place], [place] [address]    CULT  07/23/2021    CULTURE REINCUBATED FOR BETTER GROWTH Performed at [facility], [place] [address], [place], [place] [address]     Lab Results  Component Value Date   ALBUMIN 3.1 (L) 07/23/2021   ALBUMIN 2.9 (L) 11/14/2017   ALBUMIN 2.9 (L) 11/11/2017     CBC EXTENDED Latest Ref Rng & Units 07/25/2021 07/24/2021 07/23/2021  WBC 4.0 - 10.5 K/uL 9.0 10.0 10.6(H)  RBC 4.22 - 5.81 MIL/uL 4.93 4.54 4.76  HGB 13.0 - 17.0 g/dL 15.9 14.9 15.8  HCT 39.0 - 52.0 % 47.4 43.1 45.3  PLT 150 - 400 K/uL 140(L) 198 155  NEUTROABS 1.7 - 7.7 K/uL - - 7.0  LYMPHSABS 0.7 - 4.0 K/uL - - 2.5    Neurologic: Patient does not have protective sensation bilateral lower extremities.   MUSCULOSKELETAL:   Skin: Examination patient has  a necrotic ulcer with purulent drainage from the plantar aspect of the ulcer beneath the second metatarsal head.  There is no ascending cellulitis.  Patient does not have palpable pulses.  Ankle-brachial indices on the right shows an ABI of 0.67 biphasic with a toe brachial indices of 1.4.  Patient's hemoglobin is 15.9 white blood cell count 9.0 and an albumin of 3.1.  Hemoglobin A1c 12.5.  Review of the MRI scan shows the necrotic abscess but does not show osteomyelitis in the second metatarsal head however this is palpable within the wound.  Assessment: Assessment: Uncontrolled type 2 diabetes with abscess and ulceration beneath the second metatarsal head right foot with peripheral vascular disease.  Plan: I have consulted vascular vein surgery to see if patient would be a good candidate for an arteriogram study and possible revascularization.  After vascular intervention patient will require a second ray amputation right  foot.  Thank you for the consult and the opportunity to see Cody Pena, [place] [phone] 10:38 AM

## 2021-07-25 NOTE — H&P (View-Only) (Signed)
[facility] CONSULT NOTE  I have interviewed the patient and examined the patient. I agree with the findings by the PA.  This patient developed foot wounds about a month ago after he stepped on something.  The wound on the right is more significant as documented in the photographs below.  Prior to this, he denies any history of claudication or rest pain.  His risk factors for peripheral vascular disease include diabetes, hypertension, and tobacco use.  He smokes over a pack per day and has been smoking since he was 60 years old.  On my exam, on the left he has a brisk, monophasic posterior tibial signal with a monophasic peroneal signal.  I cannot get an anterior tibial signal.  On the right he has a barely biphasic, brisk posterior tibial signal with the Doppler and a dampened monophasic anterior tibial signal.  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  He has undergone a previous complex tibial intervention on the left by interventional radiology in 2019.  I have recommended that we proceed with arteriography and given the extent of the wound on the right we would focus on the right leg.  He is on aspirin, Plavix, and a statin.  We have discussed the importance of tobacco cessation.  His procedure is scheduled for Friday with Dr. Lenell Antu.  His renal function is normal.  I have reviewed with the patient the indications for arteriography. In addition, I have reviewed the potential complications of arteriography including but not limited to: Bleeding, arterial injury, arterial thrombosis, dye action, renal insufficiency, or other unpredictable medical problems. I have explained to the patient that if we find disease amenable to angioplasty we could potentially address this at the same time. I have discussed the potential complications  of angioplasty and stenting, including but not limited to: Bleeding, arterial thrombosis, arterial injury, dissection, or the need for surgical intervention.  Recommend the following which can slow the progression of atherosclerosis and reduce the risk of major adverse cardiac / limb events:  Aspirin 81mg  PO QD.  Atorvastatin 40-80mg  PO QD (or other "high intensity" statin therapy). Complete cessation from all tobacco products. Blood glucose control with goal A1c < 7%. Blood pressure control with goal blood pressure < 140/90 mmHg. Lipid reduction therapy with goal LDL-C <100 mg/dL ( if symptomatic from PAD).   <56, MD 10:06 AM    MRN : Cari Caraway  Reason for Consult: right foot non healing wound Referring Physician: Dr. [id number]  History of Present Illness: 60 y/o with non healing  ulceration beneath the second metatarsal head B feet with necrotic soft tissue and foul-smelling drainage.  He states the wounds started 3-4 weeks ago.  He thinks he may have stepped on something, then developed blisters and now non healing open wounds.  He denise symptoms of claudication and rest pain.     He has a history of angiogram with left LE ULTRASOUND GUIDED ACCESS LEFT COMMON FEMORAL ARTERY IMAGE GUIDED ACCESS LEFT DORSALIS PEDIS BALLOON ANGIOPLASTY OF ANTERIOR TIBIAL ARTERY BALLOON ANGIOPLASTY DORSALIS PEDIS ARTERY BALLOON ANGIOPLASTY OF LATERAL PLANTAR ARTERY AND DEEP PERFORATING ARTERY TREATMENT OF PROXIMAL ANTERIOR TIBIAL ARTERY WITH DRUG-ELUTING BALLOON TECHNOLOGY DEPLOYMENT OF ANGIO-SEAL FOR HEMOSTASIS 2019 at another facility.    Past medical history of DM, HTN and PAD left LE.     Current Facility-Administered Medications  Medication Dose Route Frequency Provider Last Rate Last Admin   0.9 %  sodium chloride infusion  250 mL Intravenous PRN Orland Mustard, MD       acetaminophen (TYLENOL) tablet 650 mg  650 mg Oral Q6H PRN Orland Mustard, MD       Or   acetaminophen (TYLENOL)  suppository 650 mg  650 mg Rectal Q6H PRN Orland Mustard, MD       atorvastatin (LIPITOR) tablet 80 mg  80 mg Oral Daily Orland Mustard, MD   80 mg at 07/25/21 0820   citalopram (CELEXA) tablet 40 mg  40 mg Oral Daily Orland Mustard, MD   40 mg at 07/25/21 0820   clonazePAM (KLONOPIN) tablet 0.5 mg  0.5 mg Oral Daily PRN Orland Mustard, MD       clopidogrel (PLAVIX) tablet 75 mg  75 mg Oral Daily Orland Mustard, MD   75 mg at 07/25/21 0820   enoxaparin (LOVENOX) injection 40 mg  40 mg Subcutaneous Q24H Maretta Bees, MD   40 mg at 07/25/21 0569   gabapentin (NEURONTIN) tablet 600 mg  600 mg Oral TID Orland Mustard, MD   600 mg at 07/25/21 0820   insulin aspart (novoLOG) injection 0-15 Units  0-15 Units Subcutaneous TID [facility] Orland Mustard, MD   3 Units at 07/25/21 0826   insulin aspart (novoLOG) injection 4 Units  4 Units Subcutaneous TID WC Maretta Bees, MD   4 Units at 07/25/21 0827   insulin glargine-yfgn (SEMGLEE) injection 20 Units  20 Units Subcutaneous QHS Maretta Bees, MD   20 Units at 07/24/21 2035   morphine 2 MG/ML injection 1 mg  1 mg Intravenous Q3H PRN Orland Mustard, MD   1 mg at 07/24/21 1627   multivitamin with minerals tablet 1 tablet  1 tablet Oral Daily Orland Mustard, MD   1 tablet at 07/25/21 7948   nicotine (NICODERM CQ - dosed in mg/24 hours) patch 21 mg  21 mg Transdermal Daily Orland Mustard, MD   21 mg at 07/25/21 0165   nutrition supplement (JUVEN) (JUVEN) powder packet 1 packet  1 packet Oral BID BM Maretta Bees, MD   1 packet at 07/25/21 [phone]   oxyCODONE-acetaminophen (PERCOCET/ROXICET) 5-325 MG per tablet 1 tablet  1 tablet Oral Q4H PRN Orland Mustard, MD   1 tablet at 07/25/21 8270   And   oxyCODONE (Oxy IR/ROXICODONE) immediate release tablet 5 mg  5 mg Oral Q4H PRN Orland Mustard, MD   5 mg at 07/25/21 0826   piperacillin-tazobactam (ZOSYN) IVPB 3.375 g  3.375 g Intravenous Q8H Sampson Si, RPH 12.5 mL/hr at 07/25/21 7867 3.375 g at  07/25/21 5449   senna-docusate (Senokot-S) tablet 1 tablet  1 tablet Oral QHS PRN Orland Mustard, MD       sodium chloride flush (NS) 0.9 % injection 3 mL  3 mL Intravenous Q12H Orland Mustard, MD   3 mL at 07/25/21 0827   sodium chloride flush (NS) 0.9 % injection 3 mL  3 mL Intravenous PRN Orland Mustard, MD       vancomycin Luna Kitchens) IVPB 1250 mg/250 mL  1,250 mg Intravenous Q12H Sampson Si, RPH 166.7 mL/hr at 07/25/21 0555 1,250 mg at 07/25/21 0555    Pt meds include: Statin :Yes Betablocker: No ASA: No Other anticoagulants/antiplatelets: Plavix  Past Medical History:  Diagnosis Date   Diabetes mellitus without complication (HCC)    Hypertension     Past Surgical History:  Procedure Laterality  Date   AMPUTATION Left 11/15/2017   Procedure: LEFT FOOT 2ND RAY AMPUTATION;  Surgeon: Nadara Mustard, MD;  Location: [facility] OR;  Service: Orthopedics;  Laterality: Left;   CERVICAL FUSION     IR ANGIOGRAM EXTREMITY LEFT  11/14/2017   IR ANGIOGRAM SELECTIVE EACH ADDITIONAL VESSEL  11/14/2017   IR ANGIOGRAM SELECTIVE EACH ADDITIONAL VESSEL  11/14/2017   IR TIB-PERO ART PTA MOD SED  11/14/2017   IR TIB-PERO ART UNI PTA EA ADD VESSEL MOD SED  11/14/2017   IR US GUIDE VASC ACCESS LEFT  11/14/2017    Social History Social History   Tobacco Use   Smoking status: Every Day   Smokeless tobacco: Never  Substance Use Topics   Alcohol use: No   Drug use: No    Family History No family history on file.  No Known Allergies   REVIEW OF SYSTEMS  General: [ ]  Weight loss, [ ]  Fever, [ ]  chills Neurologic: [ ]  Dizziness, [ ]  Blackouts, [ ]  Seizure [ ]  Stroke, [ ]  "Mini stroke", [ ]  Slurred speech, [ ]  Temporary blindness; [ ]  weakness in arms or legs, [ ]  Hoarseness [ ]  Dysphagia Cardiac: [ ]  Chest pain/pressure, [ ]  Shortness of breath at rest [ ]  Shortness of breath with exertion, [ ]  Atrial fibrillation or irregular heartbeat  Vascular: [ ]  Pain in legs with walking, [ ]  Pain in legs at  rest, [ ]  Pain in legs at night,  [x ] Non-healing ulcer, [ ]  Blood clot in vein/DVT,   Pulmonary: [ ]  Home oxygen, [ ]  Productive cough, [ ]  Coughing up blood, [ ]  Asthma,  [ ]  Wheezing [ ]  COPD Musculoskeletal:  [ ]  Arthritis, [ ]  Low back pain, [ ]  Joint pain Hematologic: [ ]  Easy Bruising, [ ]  Anemia; [ ]  Hepatitis Gastrointestinal: [ ]  Blood in stool, [ ]  Gastroesophageal Reflux/heartburn, Urinary: [ ]  chronic Kidney disease, [ ]  on HD - [ ]  MWF or [ ]  TTHS, [ ]  Burning with urination, [ ]  Difficulty urinating Skin: [ ]  Rashes, [ ]  Wounds Psychological: [ ]  Anxiety, [ ]  Depression  Physical Examination Vitals:   07/24/21 0736 07/24/21 1619 07/24/21 2018 07/25/21 0820  BP: 116/74 122/73 126/71 114/76  Pulse: 76 78 77 80  Resp: 16 16 18 17   Temp: 98.3 F (36.8 C) 99 F (37.2 C) 98.7 F (37.1 C) 97.8 F (36.6 C)  TempSrc: Oral Oral Oral Oral  SpO2: 93% 92% 94% 95%  Weight:      Height:       Body mass index is 28.8 kg/m.  General:  WDWN in NAD Gait: Normal HENT: WNL Eyes: Pupils equal Pulmonary: normal non-labored breathing , without Rales, rhonchi,  wheezing Cardiac: RRR, without  Murmurs, rubs or gallops; No carotid bruits Abdomen: soft, NT, no masses Skin: no rashes, ulcers noted;  no Gangrene , no cellulitis; no open wounds;      Vascular Exam/Pulses:palpable Radial, femoral pulses B    Musculoskeletal: no muscle wasting or atrophy; no edema  Neurologic: A&O X 3; Appropriate Affect ;  SENSATION: normal; MOTOR FUNCTION: 5/5 Symmetric Speech is fluent/normal   Significant Diagnostic Studies: CBC Lab Results  Component Value Date   WBC 9.0 07/25/2021   HGB 15.9 07/25/2021   HCT 47.4 07/25/2021   MCV 96.1 07/25/2021   PLT 140 (L) 07/25/2021    BMET    Component Value Date/Time   NA 134 (L) 07/25/2021 0245   K 4.0  07/25/2021 0245   CL 98 07/25/2021 0245   CO2 28 07/25/2021 0245   GLUCOSE 223 (H) 07/25/2021 0245   BUN 16 07/25/2021 0245    CREATININE 1.12 07/25/2021 0245   CALCIUM 8.7 (L) 07/25/2021 0245   GFRNONAA >60 07/25/2021 0245   GFRAA >60 11/14/2017 0936   Estimated Creatinine Clearance: 89.5 mL/min (by C-G formula based on SCr of 1.12 mg/dL).  COAG Lab Results  Component Value Date   INR 1.05 11/10/2017   INR 1.08 11/08/2017     Non-Invasive Vascular Imaging:      ABI Findings:  +---------+------------------+-----+---------+--------+   Right     Rt Pressure (mmHg) Index Waveform  Comment    +---------+------------------+-----+---------+--------+   Brachial  135                      triphasic            +---------+------------------+-----+---------+--------+   PTA       93                 0.67  biphasic             +---------+------------------+-----+---------+--------+   DP        91                 0.65  biphasic             +---------+------------------+-----+---------+--------+   Great Toe 56                 0.40                       +---------+------------------+-----+---------+--------+   +---------+------------------+-----+---------+-------+   Left      Lt Pressure (mmHg) Index Waveform  Comment   +---------+------------------+-----+---------+-------+   Brachial  139                      triphasic           +---------+------------------+-----+---------+-------+   PTA       100                0.72  biphasic            +---------+------------------+-----+---------+-------+   DP        105                0.76  biphasic            +---------+------------------+-----+---------+-------+   Great Toe 80                 0.58                      +---------+------------------+-----+---------+-------+   +-------+-----------+-----------+------------+------------+   ABI/TBI Today's ABI Today's TBI Previous ABI Previous TBI   +-------+-----------+-----------+------------+------------+   Right   0.67        0.40                                     +-------+-----------+-----------+------------+------------+   Left    0.76        058                                     +-------+-----------+-----------+------------+------------+  Summary:  Right: Resting right ankle-brachial index indicates moderate right lower  extremity arterial disease. The right toe-brachial index is abnormal.   Left: Resting left ankle-brachial index indicates moderate left lower  extremity arterial disease. The left toe-brachial index is abnormal.   Venous study RIGHT:  - There is no evidence of deep vein thrombosis in the lower extremity.  ASSESSMENT/PLAN:  PAD with B plantar foot wounds Previous angiogram with angioplasty of tibial arteries in 2019 His ABI's indicate infrainguinal disease.  We will plan and angiogram with possible intervention to maximize his inflow and assist with wound healing.   Mosetta Pigeon 07/25/2021 9:43 AM

## 2021-07-25 NOTE — Progress Notes (Signed)
  Transition of Care [facility]) Screening Note   Patient Details  Name: Cody Pena Date of Birth: [birth year]   Transition of Care [facility]) CM/SW Contact:    Kermit Balo, RN Phone Number: 07/25/2021, 9:20 AM    Transition of Care Department [facility]) has reviewed patient. We will continue to monitor patient advancement through interdisciplinary progression rounds. If new patient transition needs arise, please place a TOC consult.

## 2021-07-25 NOTE — Progress Notes (Signed)
Inpatient Diabetes Program Recommendations  AACE/ADA: New Consensus Statement on Inpatient Glycemic Control (2015)  Target Ranges:  Prepandial:   less than 140 mg/dL      Peak postprandial:   less than 180 mg/dL (1-2 hours)      Critically ill patients:  140 - 180 mg/dL   Lab Results  Component Value Date   GLUCAP 216 (H) 07/25/2021   HGBA1C 12.5 (H) 07/23/2021    Review of Glycemic Control  198, 216 mg/dL this am.  Educated patient on insulin pen use at home. Reviewed contents of insulin flexpen starter kit. Reviewed all steps if insulin pen including attachment of needle, 2-unit air shot, dialing up dose, giving injection, removing needle, disposal of sharps, storage of unused insulin, disposal of insulin etc. Patient able to provide successful return demonstration. Also reviewed troubleshooting with insulin pen. MD to give patient Rxs for insulin pens and insulin pen needles. Pt has used insulin pens in the past.   Inpatient Diabetes Program Recommendations:    Pt would like new PCP to manage his diabetes after discharge. TOC consulted.  Will follow glucose trends.   Thank you. Lorenda Peck, RD, LDN, CDE Inpatient Diabetes Coordinator [phone]

## 2021-07-25 NOTE — Progress Notes (Signed)
PROGRESS NOTE        PATIENT DETAILS Name: Cody Pena Age: 60 y.o. Sex: male Date of Birth: 1960/10/02 Admit Date: 07/23/2021 Admitting Physician Orland Mustard, MD ZOX:[id number], [name], MD  Brief Narrative: Patient is a 60 y.o. male with history of DM-2, PAD, HTN, HLD-s/p left second toe amputation in 2019-presenting with bilateral diabetic foot ulcers (bilateral plantar ulcers).  See below for further details.  Subjective: Lying comfortably in bed-no major issues.  Objective: Vitals: Blood pressure 114/76, pulse 80, temperature 97.8 F (36.6 C), temperature source Oral, resp. rate 17, height 6\' 3"  (1.905 m), weight 104.5 kg, SpO2 95 %.   Exam: Gen Exam:Alert awake-not in any distress HEENT:atraumatic, normocephalic Chest: B/L clear to auscultation anteriorly CVS:S1S2 regular Abdomen:soft non tender, non distended Extremities:no edema Neurology: Non focal Skin: no rash   Pertinent Labs/Radiology: Recent Labs  Lab 07/23/21 1441 07/24/21 0150 07/25/21 0245  WBC 10.6*   < > 9.0  HGB 15.8   < > 15.9  PLT 155   < > 140*  NA 132*   < > 134*  K 3.9   < > 4.0  CREATININE 0.90   < > 1.12  AST 11*  --   --   ALT 12  --   --   ALKPHOS 93  --   --   BILITOT 0.5  --   --    < > = values in this interval not displayed.      Assessment/Plan: Bilateral diabetic foot with acute osteomyelitis of second metatarsal head of left foot: Very minimal erythema in bilateral foot-continue IV vancomycin/Zosyn-orthopedic/vascular surgery following-scheduled for arteriogram this coming Friday.  Following vascular surgery intervention-patient will need secondary amputation of left foot.  MRI of the right foot did not show osteomyelitis, MRI of the left foot did not show osteomyelitis (see official report)  DM-2 (A1c 12.5 on 07/23/2021): CBGs on the higher side-has poor outpatient control given significantly elevated A1c.  Continue Semglee 20 units daily, 4 units  of NovoLog with meals-continue to follow and optimize.  Recent Labs    07/24/21 1704 07/24/21 2014 07/25/21 0817  GLUCAP 190* 166* 198*     HTN: BP stable-no longer on any antihypertensives  History of PAD-s/p left SFA atherectomy and stenting in 2018) continue Plavix/statin.  ABI on 12/12-concerning for progression of underlying PAD-vascular surgery consulted-arteriogram scheduled for this coming Friday.  Peripheral neuropathy: Continue Neurontin  Mood disorder: Stable-continue Celexa  Tobacco abuse: Continue transdermal nicotine-encourage  cessation  BMI Estimated body mass index is 28.8 kg/m as calculated from the following:   Height as of this encounter: 6\' 3"  (1.905 m).   Weight as of this encounter: 104.5 kg.    Procedures: None Consults: Ortho DVT Prophylaxis: Lovenox Code Status:Full code  Family Communication: None at bedside  Time spent: 25 minutes-Greater than 50% of this time was spent in counseling, explanation of diagnosis, planning of further management, and coordination of care.   Disposition Plan: Status is: Observation  The patient will require care spanning > 2 midnights and should be moved to inpatient because: Diabetic foot with underlying osteomyelitis-on IV antibiotics-will require surgical intervention in the next few days.    Diet: Diet Order             Diet heart healthy/carb modified Room service appropriate? Yes; Fluid consistency: Thin  Diet effective  now                     Antimicrobial agents: Anti-infectives (From admission, onward)    Start     Dose/Rate Route Frequency Ordered Stop   07/24/21 0600  vancomycin (VANCOREADY) IVPB 1250 mg/250 mL        1,250 mg 166.7 mL/hr over 90 Minutes Intravenous Every 12 hours 07/23/21 1611     07/23/21 2200  piperacillin-tazobactam (ZOSYN) IVPB 3.375 g        3.375 g 12.5 mL/hr over 240 Minutes Intravenous Every 8 hours 07/23/21 1922     07/23/21 1630  vancomycin (VANCOREADY)  IVPB 2000 mg/400 mL        2,000 mg 200 mL/hr over 120 Minutes Intravenous  Once 07/23/21 1605 07/23/21 2017   07/23/21 1530  piperacillin-tazobactam (ZOSYN) IVPB 3.375 g        3.375 g 100 mL/hr over 30 Minutes Intravenous  Once 07/23/21 1523 07/23/21 1715        MEDICATIONS: Scheduled Meds:  atorvastatin  80 mg Oral Daily   citalopram  40 mg Oral Daily   clopidogrel  75 mg Oral Daily   enoxaparin (LOVENOX) injection  40 mg Subcutaneous Q24H   gabapentin  600 mg Oral TID   insulin aspart  0-15 Units Subcutaneous TID WC   insulin aspart  4 Units Subcutaneous TID WC   insulin glargine-yfgn  20 Units Subcutaneous QHS   multivitamin with minerals  1 tablet Oral Daily   nicotine  21 mg Transdermal Daily   nutrition supplement (JUVEN)  1 packet Oral BID BM   sodium chloride flush  3 mL Intravenous Q12H   Continuous Infusions:  sodium chloride     piperacillin-tazobactam (ZOSYN)  IV 3.375 g (07/25/21 0822)   vancomycin 1,250 mg (07/25/21 0555)   PRN Meds:.sodium chloride, acetaminophen **OR** acetaminophen, clonazePAM, morphine injection, oxyCODONE-acetaminophen **AND** oxyCODONE, senna-docusate, sodium chloride flush   I have personally reviewed following labs and imaging studies  LABORATORY DATA: CBC: Recent Labs  Lab 07/23/21 1441 07/24/21 0150 07/25/21 0245  WBC 10.6* 10.0 9.0  NEUTROABS 7.0  --   --   HGB 15.8 14.9 15.9  HCT 45.3 43.1 47.4  MCV 95.2 94.9 96.1  PLT 155 198 140*     Basic Metabolic Panel: Recent Labs  Lab 07/23/21 1441 07/24/21 0150 07/25/21 0245  NA 132* 133* 134*  K 3.9 3.9 4.0  CL 96* 97* 98  CO2 GLUCOSE 338* 259* 223*  BUN CREATININE 0.90 0.91 1.12  CALCIUM 8.5* 8.4* 8.7*     GFR: Estimated Creatinine Clearance: 89.5 mL/min (by C-G formula based on SCr of 1.12 mg/dL).  Liver Function Tests: Recent Labs  Lab 07/23/21 1441  AST 11*  ALT 12  ALKPHOS 93  BILITOT 0.5  PROT 6.5  ALBUMIN 3.1*    No  results for input(s): LIPASE, AMYLASE in the last 168 hours. No results for input(s): AMMONIA in the last 168 hours.  Coagulation Profile: No results for input(s): INR, PROTIME in the last 168 hours.  Cardiac Enzymes: No results for input(s): CKTOTAL, CKMB, CKMBINDEX, TROPONINI in the last 168 hours.  BNP (last 3 results) No results for input(s): PROBNP in the last 8760 hours.  Lipid Profile: Recent Labs    07/24/21 0150  CHOL 131  HDL 28*  LDLCALC 48  TRIG 161*  CHOLHDL 4.7     Thyroid Function Tests: No results for  input(s): TSH, T4TOTAL, FREET4, T3FREE, THYROIDAB in the last 72 hours.  Anemia Panel: No results for input(s): VITAMINB12, FOLATE, FERRITIN, TIBC, IRON, RETICCTPCT in the last 72 hours.  Urine analysis:    Component Value Date/Time   COLORURINE YELLOW 12/07/2014 0325   APPEARANCEUR CLEAR 12/07/2014 0325   LABSPEC 1.023 12/07/2014 0325   PHURINE 5.5 12/07/2014 0325   GLUCOSEU 500 (A) 12/07/2014 0325   HGBUR NEGATIVE 12/07/2014 0325   BILIRUBINUR NEGATIVE 12/07/2014 0325   KETONESUR NEGATIVE 12/07/2014 0325   PROTEINUR NEGATIVE 12/07/2014 0325   UROBILINOGEN 1.0 12/07/2014 0325   NITRITE NEGATIVE 12/07/2014 0325   LEUKOCYTESUR NEGATIVE 12/07/2014 0325    Sepsis Labs: Lactic Acid, Venous    Component Value Date/Time   LATICACIDVEN 1.2 07/23/2021 1546    MICROBIOLOGY: Recent Results (from the past 240 hour(s))  Resp Panel by RT-PCR (Flu A&B, Covid) Nasopharyngeal Swab     Status: None   Collection Time: 07/23/21  3:46 PM   Specimen: Nasopharyngeal Swab; Nasopharyngeal(NP) swabs in vial transport medium  Result Value Ref Range Status   SARS Coronavirus 2 by RT PCR NEGATIVE NEGATIVE Final    Comment: (NOTE) SARS-CoV-2 target nucleic acids are NOT DETECTED.  The SARS-CoV-2 RNA is generally detectable in upper respiratory specimens during the acute phase of infection. The lowest concentration of SARS-CoV-2 viral copies this assay can detect  is 138 copies/mL. A negative result does not preclude SARS-Cov-2 infection and should not be used as the sole basis for treatment or other patient management decisions. A negative result may occur with  improper specimen collection/handling, submission of specimen other than nasopharyngeal swab, presence of viral mutation(s) within the areas targeted by this assay, and inadequate number of viral copies(<138 copies/mL). A negative result must be combined with clinical observations, patient history, and epidemiological information. The expected result is Negative.  Fact Sheet for Patients:  BloggerCourse.com  Fact Sheet for Healthcare Providers:  SeriousBroker.it  This test is no t yet approved or cleared by the [place] FDA and  has been authorized for detection and/or diagnosis of SARS-CoV-2 by FDA under an Emergency Use Authorization (EUA). This EUA will remain  in effect (meaning this test can be used) for the duration of the COVID-19 declaration under Section 564(b)(1) of the Act, 21 U.S.C.section 360bbb-3(b)(1), unless the authorization is terminated  or revoked sooner.       Influenza A by PCR NEGATIVE NEGATIVE Final   Influenza B by PCR NEGATIVE NEGATIVE Final    Comment: (NOTE) The Xpert Xpress SARS-CoV-2/FLU/RSV plus assay is intended as an aid in the diagnosis of influenza from Nasopharyngeal swab specimens and should not be used as a sole basis for treatment. Nasal washings and aspirates are unacceptable for Xpert Xpress SARS-CoV-2/FLU/RSV testing.  Fact Sheet for Patients: BloggerCourse.com  Fact Sheet for Healthcare Providers: SeriousBroker.it  This test is not yet approved or cleared by the [place] FDA and has been authorized for detection and/or diagnosis of SARS-CoV-2 by FDA under an Emergency Use Authorization (EUA). This EUA will remain in effect  (meaning this test can be used) for the duration of the COVID-19 declaration under Section 564(b)(1) of the Act, 21 U.S.C. section 360bbb-3(b)(1), unless the authorization is terminated or revoked.  Performed at [facility], [address] [place], [place] [address]   Aerobic Culture w Gram Stain (superficial specimen)     Status: None (Preliminary result)   Collection Time: 07/23/21  4:20 PM   Specimen: Wound  Result Value  Ref Range Status   Specimen Description WOUND  Final   Special Requests RIGHT FOOT  Final   Gram Stain   Final    RARE WBC PRESENT, PREDOMINANTLY MONONUCLEAR FEW GRAM POSITIVE COCCI IN PAIRS AND CHAINS    Culture   Final    CULTURE REINCUBATED FOR BETTER GROWTH Performed at [facility], [address], [place], [place] [address]    Report Status PENDING  Incomplete  Aerobic Culture w Gram Stain (superficial specimen)     Status: None (Preliminary result)   Collection Time: 07/23/21  4:20 PM   Specimen: Wound  Result Value Ref Range Status   Specimen Description WOUND  Final   Special Requests LEFT FOOT  Final   Gram Stain   Final    NO WBC SEEN MODERATE GRAM POSITIVE COCCI FEW GRAM POSITIVE RODS    Culture   Final    CULTURE REINCUBATED FOR BETTER GROWTH Performed at [facility], [address], [place], [place] [address]    Report Status PENDING  Incomplete    RADIOLOGY STUDIES/RESULTS: MR FOOT RIGHT WO CONTRAST  Result Date: 07/24/2021 CLINICAL DATA:  Foot swelling, diabetic, osteomyelitis suspected, xray done EXAM: MRI OF THE RIGHT FOREFOOT WITHOUT CONTRAST TECHNIQUE: Multiplanar, multisequence MR imaging of the right forefoot was performed. No intravenous contrast was administered. COMPARISON:  Right foot radiograph 07/23/2021 FINDINGS: Bones/Joint/Cartilage There is no significant marrow signal alteration. The cortex is intact. Ligaments Intact Lisfranc ligament.  Intact MTP collateral ligaments. Muscles and Tendons Diffuse  intramuscular edema and mild atrophy in the foot, as is commonly seen in diabetics. Soft tissues Diffuse soft tissue swelling dorsally. There is a plantar soft tissue ulcer along the medial forefoot. Ill-defined fluid and edema adjacent to the ulcer in the medial forefoot (axial T2 image 35, sagittal STIR image 26. IMPRESSION: Plantar soft tissue ulcer along the medial forefoot. No evidence of underlying osteomyelitis. Ill-defined fluid and edema adjacent to the ulcer, favored to be localized inflammatory change. Developing phlegmon/abscess is possible, correlate with drainage. Electronically Signed   By: Caprice Renshaw M.D.   On: 07/24/2021 15:13   MR FOOT LEFT WO CONTRAST  Result Date: 07/24/2021 CLINICAL DATA:  Diabetic foot ulcer EXAM: MRI OF THE LEFT FOOT WITHOUT CONTRAST TECHNIQUE: Multiplanar, multisequence MR imaging of the left forefoot was performed. No intravenous contrast was administered. COMPARISON:  X-ray 07/23/2021, MRI 11/08/2017 FINDINGS: Bones/Joint/Cartilage Status post second ray resection to the level of the second metatarsal neck. There is also been a osteotomy of the third metatarsal head. Bone marrow edema and intermediate-low T1 signal changes within the distal second metatarsal at the resection site concerning for osteomyelitis. Marrow edema at the base of the third toe proximal phalanx, also with intermediate T1 signal is suspicious for osteomyelitis. Minimal marrow edema within the distal aspect of the third metatarsal with preservation of the T1 marrow signal. First TMT joint effusion with prominent bone marrow edema and low T1 signal changes within the first metatarsal base extending to the level of the proximal diaphysis as well as within the distal aspect of the medial cuneiform. Subchondral cysts or erosions along both sides of joint. Findings are concerning for septic arthritis with osteomyelitis. Similar findings are present at the fourth tarsometatarsal joint with suspicion of  septic arthritis and osteomyelitis. No acute fracture. No dislocation. Degenerative changes are present within the forefoot. Ligaments Lisfranc ligament appears grossly intact. No evidence of acute collateral ligament injury. Muscles and Tendons Signal changes within the  foot musculature suggest a combination of chronic denervation and myositis. Post amputation changes within the second toe tendons. No large tenosynovial fluid collection. Soft tissues Ulceration at the plantar aspect of the foot underlying the level of the third metatarsal head. Diffuse soft tissue edema. Focal 5 mm fluid collection underlying the plantar aspect of the residual distal second metatarsal may reflect a small abscess (series 4, image 23). IMPRESSION: 1. Ulceration at the plantar aspect of the foot underlying the level of the third metatarsal head. Evidence of osteomyelitis involving the base of the third toe proximal phalanx. 2. Signal changes within the distal second metatarsal at the resection site are also concerning for osteomyelitis. 3. First TMT joint effusion with prominent bone marrow edema within the first metatarsal base extending to the level of the proximal diaphysis as well as within the distal aspect of the medial cuneiform. Although this could be degenerative in etiology, findings are concerning for septic arthritis with osteomyelitis. 4. Similar findings are present at the fourth tarsometatarsal joint where there is also suspicion for septic arthritis and osteomyelitis. 5. Focal 5 mm fluid collection underlying the plantar aspect of the residual distal second metatarsal may reflect a small abscess. 6. Signal changes within the foot musculature suggest a combination of chronic denervation and myositis. These results will be called to the ordering clinician or representative by the Radiologist Assistant, and communication documented in the PACS or [employer]. Electronically Signed   By: Duanne Guess D.O.   On:  07/24/2021 08:22   DG Foot Complete Left  Result Date: 07/23/2021 CLINICAL DATA:  Diabetic foot ulcers. EXAM: LEFT FOOT - COMPLETE 3+ VIEW COMPARISON:  Foot radiograph 08/07/2018 FINDINGS: Osteotomy of the second ray unchanged from prior. There is new loss of the third ray metacarpal head which may be postsurgical. There is rarefaction of the bone at the base of the proximal phalanx third digit along the medial border. Plantar ulceration the level of the metatarsal heads seen on lateral projection. No foreign body identified IMPRESSION: 1. Concern for osteomyelitis at the base of the proximal phalanx third digit. 2. Probable osteotomy of the third ray RIGHT metatarsal. Prior second ray osteotomy. 3. Soft tissue ulceration along the plantar surface of the metatarsal head row. No foreign body. Electronically Signed   By: Genevive Bi M.D.   On: 07/23/2021 15:10   DG Foot Complete Right  Result Date: 07/23/2021 CLINICAL DATA:  Diabetic foot ulcer.  Evaluation for osteomyelitis EXAM: RIGHT FOOT COMPLETE - 3+ VIEW COMPARISON:  None. FINDINGS: No cortical erosion or periosteal reaction concerning for osteomyelitis. Prominent plantar calcaneal spurring. No acute fracture or dislocation. Deep skin wound about the plantar aspect of the distal foot. IMPRESSION: No radiographic evidence of osteomyelitis. If there is a deep skin wound and clinical concern for osteomyelitis, MRI examination could be obtained for further evaluation. Electronically Signed   By: Larose Hires D.O.   On: 07/23/2021 15:04   VAS [place] ABI WITH/WO TBI  Result Date: 07/24/2021  LOWER EXTREMITY DOPPLER STUDY Patient Name:  LUKASZ ROGUS  Date of Exam:   07/24/2021 Medical Rec #: [id number]        Accession #:    [id number] Date of Birth: 12-Oct-1960       Patient Gender: M Patient Age:   73 years Exam Location:  [facility] Procedure:      VAS [place] ABI WITH/WO TBI Referring Phys: Jeoffrey Massed  --------------------------------------------------------------------------------  Indications: Ulceration, gangrene, and peripheral artery disease. High  Risk Factors: Hypertension, hyperlipidemia, Diabetes, current smoker. Other Factors: History of osteomyelitis, left foot 2nd ray amputation 11/15/2017.  Vascular Interventions: Left SFA atherectomy and stenting by interventional                         radiology in 03/2017. Comparison Study: No prior study Performing Technologist: Sherren Kerns RVS  Examination Guidelines: A complete evaluation includes at minimum, Doppler waveform signals and systolic blood pressure reading at the level of bilateral brachial, anterior tibial, and posterior tibial arteries, when vessel segments are accessible. Bilateral testing is considered an integral part of a complete examination. Photoelectric Plethysmograph (PPG) waveforms and toe systolic pressure readings are included as required and additional duplex testing as needed. Limited examinations for reoccurring indications may be performed as noted.  ABI Findings: +---------+------------------+-----+---------+--------+  Right     Rt Pressure (mmHg) Index Waveform  Comment   +---------+------------------+-----+---------+--------+  Brachial  135                      triphasic           +---------+------------------+-----+---------+--------+  PTA       93                 0.67  biphasic            +---------+------------------+-----+---------+--------+  DP        91                 0.65  biphasic            +---------+------------------+-----+---------+--------+  Great Toe 56                 0.40                      +---------+------------------+-----+---------+--------+ +---------+------------------+-----+---------+-------+  Left      Lt Pressure (mmHg) Index Waveform  Comment  +---------+------------------+-----+---------+-------+  Brachial  139                      triphasic           +---------+------------------+-----+---------+-------+  PTA       100                0.72  biphasic           +---------+------------------+-----+---------+-------+  DP        105                0.76  biphasic           +---------+------------------+-----+---------+-------+  Great Toe 80                 0.58                     +---------+------------------+-----+---------+-------+ +-------+-----------+-----------+------------+------------+  ABI/TBI Today's ABI Today's TBI Previous ABI Previous TBI  +-------+-----------+-----------+------------+------------+  Right   0.67        0.40                                   +-------+-----------+-----------+------------+------------+  Left    0.76        058                                    +-------+-----------+-----------+------------+------------+  Summary: Right: Resting right ankle-brachial index indicates moderate right lower extremity arterial disease. The right toe-brachial index is abnormal. Left: Resting left ankle-brachial index indicates moderate left lower extremity arterial disease. The left toe-brachial index is abnormal.  *See table(s) above for measurements and observations.  Electronically signed by Lemar Livings MD on 07/24/2021 at 4:20:06 PM.    Final    VAS [place] LOWER EXTREMITY VENOUS (DVT)  Result Date: 07/24/2021  Lower Venous DVT Study Patient Name:  FRANCISO DIERKS  Date of Exam:   07/24/2021 Medical Rec #: [id number]        Accession #:    [id number] Date of Birth: 1960/10/23       Patient Gender: M Patient Age:   1 years Exam Location:  [facility] Procedure:      VAS [place] LOWER EXTREMITY VENOUS (DVT) Referring Phys: Orland Mustard --------------------------------------------------------------------------------  Indications: Edema, and gangrenous ulcer of right foot.  Comparison Study: No prior study on file Performing Technologist: Sherren Kerns RVS  Examination Guidelines: A complete evaluation includes B-mode imaging, spectral Doppler,  color Doppler, and power Doppler as needed of all accessible portions of each vessel. Bilateral testing is considered an integral part of a complete examination. Limited examinations for reoccurring indications may be performed as noted. The reflux portion of the exam is performed with the patient in reverse Trendelenburg.  +---------+---------------+---------+-----------+----------+--------------+  RIGHT     Compressibility Phasicity Spontaneity Properties Thrombus Aging  +---------+---------------+---------+-----------+----------+--------------+  CFV       Full            Yes       Yes                                    +---------+---------------+---------+-----------+----------+--------------+  SFJ       Full                                                             +---------+---------------+---------+-----------+----------+--------------+  FV Prox   Full                                                             +---------+---------------+---------+-----------+----------+--------------+  FV Mid    Full                                                             +---------+---------------+---------+-----------+----------+--------------+  FV Distal Full                                                             +---------+---------------+---------+-----------+----------+--------------+  PFV       Full                                                             +---------+---------------+---------+-----------+----------+--------------+  POP       Full            Yes       Yes                                    +---------+---------------+---------+-----------+----------+--------------+  PTV       Full                                                             +---------+---------------+---------+-----------+----------+--------------+  PERO      Full                                                             +---------+---------------+---------+-----------+----------+--------------+    +----+---------------+---------+-----------+----------+--------------+  LEFT Compressibility Phasicity Spontaneity Properties Thrombus Aging  +----+---------------+---------+-----------+----------+--------------+  CFV  Full            Yes       Yes                                    +----+---------------+---------+-----------+----------+--------------+     Summary: RIGHT: - There is no evidence of deep vein thrombosis in the lower extremity.  - No cystic structure found in the popliteal fossa. - Ultrasound characteristics of enlarged lymph nodes are noted in the groin.  LEFT: - No evidence of common femoral vein obstruction. - Ultrasound characteristics of enlarged lymph nodes noted in the groin.  *See table(s) above for measurements and observations. Electronically signed by Lemar Livings MD on 07/24/2021 at 4:19:44 PM.    Final      LOS: 1 day   Jeoffrey Massed, MD  [facility]    To contact the attending provider between 7A-7P or the covering provider during after hours 7P-7A, please log into the web site [email or web address] and access using universal [place] password for that web site. If you do not have the password, please call the hospital operator.  07/25/2021, 11:29 AM

## 2021-07-26 ENCOUNTER — Other Ambulatory Visit: Payer: Self-pay

## 2021-07-26 ENCOUNTER — Other Ambulatory Visit (HOSPITAL_COMMUNITY): Payer: Self-pay

## 2021-07-26 ENCOUNTER — Encounter (HOSPITAL_COMMUNITY): Payer: Self-pay | Admitting: Family Medicine

## 2021-07-26 DIAGNOSIS — F39 Unspecified mood [affective] disorder: Secondary | ICD-10-CM

## 2021-07-26 LAB — GLUCOSE, CAPILLARY
Glucose-Capillary: 237 mg/dL — ABNORMAL HIGH (ref 70–99)
Glucose-Capillary: 241 mg/dL — ABNORMAL HIGH (ref 70–99)

## 2021-07-26 LAB — VANCOMYCIN, TROUGH: Vancomycin Tr: 14 ug/mL — ABNORMAL LOW (ref 15–20)

## 2021-07-26 MED ORDER — BLOOD GLUCOSE MONITOR SYSTEM W/DEVICE KIT
PACK | 0 refills | Status: DC
  Filled 2021-07-26: qty 1, 30d supply, fill #0

## 2021-07-26 MED ORDER — VITAMIN D (ERGOCALCIFEROL) 1.25 MG (50000 UNIT) PO CAPS
50000.0000 [IU] | ORAL_CAPSULE | ORAL | 0 refills | Status: DC
  Filled 2021-07-26: qty 5, 30d supply, fill #0

## 2021-07-26 MED ORDER — CITALOPRAM HYDROBROMIDE 40 MG PO TABS
40.0000 mg | ORAL_TABLET | Freq: Every day | ORAL | 3 refills | Status: DC
  Filled 2021-07-26: qty 30, 30d supply, fill #0

## 2021-07-26 MED ORDER — OXYCODONE-ACETAMINOPHEN 10-325 MG PO TABS
1.0000 | ORAL_TABLET | Freq: Three times a day (TID) | ORAL | 0 refills | Status: DC | PRN
  Filled 2021-07-26: qty 20, 7d supply, fill #0

## 2021-07-26 MED ORDER — GABAPENTIN 600 MG PO TABS
600.0000 mg | ORAL_TABLET | Freq: Three times a day (TID) | ORAL | 3 refills | Status: DC
  Filled 2021-07-26: qty 90, 30d supply, fill #0

## 2021-07-26 MED ORDER — ATORVASTATIN CALCIUM 80 MG PO TABS
80.0000 mg | ORAL_TABLET | Freq: Every day | ORAL | 3 refills | Status: DC
  Filled 2021-07-26: qty 30, 30d supply, fill #0

## 2021-07-26 MED ORDER — INSULIN PEN NEEDLE 32G X 4 MM MISC
1.0000 | 0 refills | Status: DC | PRN
  Filled 2021-07-26: qty 200, 30d supply, fill #0

## 2021-07-26 MED ORDER — CLOPIDOGREL BISULFATE 75 MG PO TABS
75.0000 mg | ORAL_TABLET | Freq: Every day | ORAL | 3 refills | Status: DC
  Filled 2021-07-26: qty 30, 30d supply, fill #0

## 2021-07-26 MED ORDER — INSULIN LISPRO (1 UNIT DIAL) 100 UNIT/ML (KWIKPEN)
PEN_INJECTOR | SUBCUTANEOUS | 11 refills | Status: DC
  Filled 2021-07-26: qty 15, 30d supply, fill #0

## 2021-07-26 MED ORDER — GLUCOSE BLOOD VI STRP
ORAL_STRIP | 0 refills | Status: DC
  Filled 2021-07-26: qty 100, 30d supply, fill #0

## 2021-07-26 MED ORDER — BASAGLAR KWIKPEN 100 UNIT/ML ~~LOC~~ SOPN
24.0000 [IU] | PEN_INJECTOR | Freq: Every day | SUBCUTANEOUS | 2 refills | Status: DC
  Filled 2021-07-26: qty 9, 30d supply, fill #0

## 2021-07-26 MED ORDER — AMOXICILLIN-POT CLAVULANATE 875-125 MG PO TABS
1.0000 | ORAL_TABLET | Freq: Two times a day (BID) | ORAL | 0 refills | Status: AC
  Filled 2021-07-26: qty 28, 14d supply, fill #0

## 2021-07-26 MED ORDER — VANCOMYCIN HCL IN DEXTROSE 1-5 GM/200ML-% IV SOLN
1000.0000 mg | Freq: Two times a day (BID) | INTRAVENOUS | Status: DC
  Administered 2021-07-26: 07:00:00 1000 mg via INTRAVENOUS
  Filled 2021-07-26: qty 200

## 2021-07-26 MED ORDER — LOPERAMIDE HCL 2 MG PO CAPS
2.0000 mg | ORAL_CAPSULE | ORAL | 0 refills | Status: DC | PRN
  Filled 2021-07-26: qty 15, 30d supply, fill #0

## 2021-07-26 MED ORDER — LOPERAMIDE HCL 2 MG PO CAPS: 2.0000 mg | ORAL_CAPSULE | ORAL | Status: DC | PRN

## 2021-07-26 MED ORDER — METFORMIN HCL 500 MG PO TABS
500.0000 mg | ORAL_TABLET | Freq: Two times a day (BID) | ORAL | 11 refills | Status: DC
  Filled 2021-07-26: qty 60, 30d supply, fill #0

## 2021-07-26 MED ORDER — ACCU-CHEK SOFTCLIX LANCETS MISC
0 refills | Status: DC
  Filled 2021-07-26: qty 100, 30d supply, fill #0

## 2021-07-26 NOTE — Plan of Care (Signed)

## 2021-07-26 NOTE — Progress Notes (Signed)
VASCULAR SURGERY:  He is scheduled for an arteriogram and possible intervention on Friday.  Cari Caraway, MD 6:26 AM

## 2021-07-26 NOTE — Progress Notes (Signed)
Pharmacy Antibiotic Note  Cody Pena is a 60 y.o. 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  Pharmacy has been consulted for vancomycin dosing (he is also on zosyn) -wound cultures show streptococcus agalactiae -SCr= 1.1 -Vancomycin peak= 32 (8:20pm), VT= 14 (5am); calculated AUC= 550 -Change to 1gm IV q12h for AUC of 501  Plan: -Change vancomycin to 1gm IV q12h (AUC =501) -Will follow renal function, cultures and clinical progress    Height: 6\' 3"  (190.5 cm) Weight: 104.5 kg (230 lb 6.4 oz) IBW/kg (Calculated) : 84.5  Temp (24hrs), Avg:97.9 F (36.6 C), Min:97.8 F (36.6 C), Max:98 F (36.7 C)  Recent Labs  Lab 07/23/21 1441 07/23/21 1546 07/24/21 0150 07/25/21 0245 07/25/21 2021 07/26/21 0503  WBC 10.6*  --  10.0 9.0  --   --   CREATININE 0.90  --  0.91 1.12  --   --   LATICACIDVEN  --  1.2  --   --   --   --   VANCOTROUGH  --   --   --   --   --  14*  VANCOPEAK  --   --   --   --  32  --      Estimated Creatinine Clearance: 89.5 mL/min (by C-G formula based on SCr of 1.12 mg/dL).    No Known Allergies  Antimicrobials this admission: Zosyn 12/11 >>  Vancomycin 12/11 >>   Dose adjustments this admission:   Microbiology results: 12/11 Wound: streptococcus agalactiae   Thank you for allowing pharmacy to be a part of this patients care.  14/11, PharmD Clinical Pharmacist **Pharmacist phone directory can now be found on [email or web address] ([email or web address]).  Listed under [facility].

## 2021-07-26 NOTE — Progress Notes (Signed)
Spoke with Dr. Lajoyce Corners and Dr. Tomie China to discharge-he will be brought back on Friday for his arteriogram-Dr. Lajoyce Corners will then follow patient in the office once vascular procedures have been completed.  Per my conversation with Dr. Earlie Counts will go home on 2 weeks of antimicrobial therapy with Augmentin.  See discharge summary for further details.

## 2021-07-26 NOTE — Progress Notes (Signed)
IV removal well tolerated, patient states he has a good understanding of dressing changes, and discharge instructions.  He states he has all of his belongings.

## 2021-07-26 NOTE — Discharge Summary (Signed)
PATIENT DETAILS Name: Cody Pena Age: 60 y.o. Sex: male Date of Birth: Mar 23, 1961 MRN: [id number]. Admitting Physician: Orma Flaming, MD PNP:[id number], Jeneen Rinks, MD  Admit Date: 07/23/2021 Discharge date: 07/26/2021  Recommendations for Outpatient Follow-up:  Follow up with PCP in 1-2 weeks Please obtain CMP/CBC in one week Please ensure follow-up with vascular surgery and orthopedics  Admitted From:  Home  Disposition: [place]: No  Equipment/Devices: None  Discharge Condition: Stable  CODE STATUS: FULL CODE  Diet recommendation:  Diet Order             Diet - low sodium heart healthy           Diet Carb Modified           Diet heart healthy/carb modified Room service appropriate? Yes; Fluid consistency: Thin  Diet effective now                    Brief Summary: Patient is a 60 y.o. male with history of DM-2, PAD, HTN, HLD-s/p left second toe amputation in 2019-presenting with bilateral diabetic foot ulcers (bilateral plantar ulcers).  See below for further details.  Brief Hospital Course: Bilateral diabetic foot with acute osteomyelitis of second metatarsal head of left foot: Erythema has resolved however hardly any discharge from the wounds-treated with vancomycin and Zosyn-orthopedic/vascular surgery consulted.  Vascular surgery planning on arteriogram this coming Friday-following with orthopedic surgery will arrange for orthopedic procedure.  Spoke with both Dr. Linnell Fulling Dr. Doren Custard today-okay for discharge-vascular surgery will arrange for patient to come back this coming Friday for arteriogram-Dr. Sharol Given will see patient in his office next week.  Per my conversation with Dr. Ancil Boozer will go home on 2 weeks of oral Augmentin.  Discussed with patient-he is agreeable with this plan.     DM-2 (A1c 12.5 on 07/23/2021): CBGs relatively stable-continue Semglee 24 units daily, SSI NovoLog, metformin on discharge.  Patient unfortunately has been  noncompliant with his insulin regimen for almost 6 weeks-as he could not get refills.  He is aware of significant morbidity and mortality issues with uncontrolled diabetes.  He will need further optimization of his regimen by PCP.  HTN: BP stable-no longer on any antihypertensives   History of PAD-s/p left SFA atherectomy and stenting in 2018) continue Plavix/statin.  ABI on 12/12-concerning for progression of underlying PAD-vascular surgery consulted-arteriogram scheduled for this coming Friday.   Peripheral neuropathy: Continue Neurontin   Mood disorder: Stable-continue Celexa   Tobacco abuse: Counseled-encouraged cessation.   BMI Estimated body mass index is 28.8 kg/m as calculated from the following:   Height as of this encounter: 6' 3"  (1.905 m).   Weight as of this encounter: 104.5 kg.   Procedures None  Discharge Diagnoses:  Active Problems:   Mood disorder (HCC)   Type 2 DM with diabetic peripheral angiopathy w/o gangrene (HCC)   Cigarette smoker   Diabetic peripheral neuropathy (HCC)   Foot osteomyelitis, left (HCC)   HTN (hypertension)   Hyperlipidemia   Diabetic foot ulcers ([place])   Osteomyelitis ([place])   Discharge Instructions:  Activity:  As tolerated    Discharge Instructions     Call MD for:  difficulty breathing, headache or visual disturbances   Complete by: As directed    Call MD for:  redness, tenderness, or signs of infection (pain, swelling, redness, odor or green/yellow discharge around incision site)   Complete by: As directed    Diet - low sodium heart healthy   Complete  by: As directed    Diet Carb Modified   Complete by: As directed    Discharge instructions   Complete by: As directed    Follow with Primary MD  Tamsen Roers, MD in 1-2 weeks  Please get a complete blood count and chemistry panel checked by your Primary MD at your next visit, and again as instructed by your Primary MD.  Get Medicines reviewed and adjusted: Please take  all your medications with you for your next visit with your Primary MD  Laboratory/radiological data: Please request your Primary MD to go over all hospital tests and procedure/radiological results at the follow up, please ask your Primary MD to get all Hospital records sent to his/her office.  In some cases, they will be blood work, cultures and biopsy results pending at the time of your discharge. Please request that your primary care M.D. follows up on these results.  Also Note the following: If you experience worsening of your admission symptoms, develop shortness of breath, life threatening emergency, suicidal or homicidal thoughts you must seek medical attention immediately by calling 911 or calling your MD immediately  if symptoms less severe.  You must read complete instructions/literature along with all the possible adverse reactions/side effects for all the Medicines you take and that have been prescribed to you. Take any new Medicines after you have completely understood and accpet all the possible adverse reactions/side effects.   Do not drive when taking Pain medications or sleeping medications (Benzodaizepines)  Do not take more than prescribed Pain, Sleep and Anxiety Medications. It is not advisable to combine anxiety,sleep and pain medications without talking with your primary care practitioner  Special Instructions: If you have smoked or chewed Tobacco  in the last 2 yrs please stop smoking, stop any regular Alcohol  and or any Recreational drug use.  Wear Seat belts while driving.  Please note: You were cared for by a hospitalist during your hospital stay. Once you are discharged, your primary care physician will handle any further medical issues. Please note that NO REFILLS for any discharge medications will be authorized once you are discharged, as it is imperative that you return to your primary care physician (or establish a relationship with a primary care physician if you  do not have one) for your post hospital discharge needs so that they can reassess your need for medications and monitor your lab values.   1.  You will get a call from vascular surgery-regarding arteriogram on this coming Friday (12/16)  2.  You will get a call from Dr. Jess Barters office (orthopedics)-regarding outpatient follow-up with him.  Continue on Augmentin until seen by Dr. Sharol Given.  3.  Keep a record of your CBG readings and take it to your next appointment with your primary care practitioner.   Discharge wound care:   Complete by: As directed    Cleanse wounds to left and right plantar feet with NS and pat dry.  Apply Xeroform gauze to wound bed. Cover with dry dressing, kerlix and tape. Change daily.   Increase activity slowly   Complete by: As directed       Allergies as of 07/26/2021   No Known Allergies      Medication List     STOP taking these medications    pentoxifylline 400 MG CR tablet Commonly known as: TRENTAL   TRULICITY [place]       TAKE these medications    amoxicillin-clavulanate 875-125 MG tablet Commonly known as: Augmentin Take  1 tablet by mouth 2 (two) times daily for 14 days.   atorvastatin 80 MG tablet Commonly known as: LIPITOR Take 1 tablet (80 mg total) by mouth daily.   Basaglar KwikPen 100 UNIT/ML Inject 24 Units into the skin at bedtime. What changed:  medication strength how much to take   blood glucose meter kit and supplies Dispense based on patient and insurance preference. Use up to four times daily as directed. (FOR ICD-10 E10.9, E11.9).   citalopram 40 MG tablet Commonly known as: CELEXA Take 1 tablet (40 mg total) by mouth daily.   clonazePAM 0.5 MG tablet Commonly known as: KLONOPIN Take 0.5 mg by mouth daily as needed.   clopidogrel 75 MG tablet Commonly known as: PLAVIX Take 1 tablet (75 mg total) by mouth daily.   gabapentin 600 MG tablet Commonly known as: NEURONTIN Take 1 tablet (600 mg total) by mouth 3 (three)  times daily.   insulin aspart 100 UNIT/ML FlexPen Commonly known as: NOVOLOG 0-15 Units, Subcutaneous, 3 times daily with meals CBG < 70: Implement Hypoglycemia measures CBG 70 - 120: 0 units CBG 121 - 150: 2 units CBG 151 - 200: 3 units CBG 201 - 250: 5 units CBG 251 - 300: 8 units CBG 301 - 350: 11 units CBG 351 - 400: 15 units CBG > 400: call MD   Insulin Pen Needle 32G X 4 MM Misc 1 each by Does not apply route as needed.   loperamide 2 MG capsule Commonly known as: IMODIUM Take 1 capsule (2 mg total) by mouth as needed for diarrhea or loose stools.   metFORMIN 500 MG tablet Commonly known as: Glucophage Take 1 tablet (500 mg total) by mouth 2 (two) times daily with a meal.   multivitamin with minerals Tabs tablet Take 1 tablet by mouth daily.   nicotine 21 mg/24hr patch Commonly known as: NICODERM CQ - dosed in mg/24 hours Place 1 patch (21 mg total) onto the skin daily.   oxyCODONE-acetaminophen 10-325 MG tablet Commonly known as: PERCOCET Take 1 tablet by mouth every 8 (eight) hours as needed for pain. May take 1 tablet What changed: when to take this   Vitamin D (Ergocalciferol) 1.25 MG (50000 UNIT) Caps capsule Commonly known as: DRISDOL Take 1 capsule (50,000 Units total) by mouth every 7 (seven) days.               Discharge Care Instructions  (From admission, onward)           Start     Ordered   07/26/21 0000  Discharge wound care:       Comments: Cleanse wounds to left and right plantar feet with NS and pat dry.  Apply Xeroform gauze to wound bed. Cover with dry dressing, kerlix and tape. Change daily.   07/26/21 1050            Follow-up Information     Newt Minion, MD Follow up in 1 week(s).   Specialty: Orthopedic Surgery Contact information: [place] [address] [phone]         Tamsen Roers, MD. Schedule an appointment as soon as possible for a visit in 1 week(s).   Specialty: Family Medicine Contact  information: [address] [place] [address] [phone]         [facility] Follow up.   Why: Office will call with date/time, If you dont hear from them,please give them a call Contact information: [address] [place] [phone]               No Known Allergies    Consultations: Orthopedics, vascular surgery.   Other Procedures/Studies: MR FOOT RIGHT WO CONTRAST  Result Date: 07/24/2021 CLINICAL DATA:  Foot swelling, diabetic, osteomyelitis suspected, xray done EXAM: MRI OF THE RIGHT FOREFOOT WITHOUT CONTRAST TECHNIQUE: Multiplanar, multisequence MR imaging of the right forefoot was performed. No intravenous contrast was administered. COMPARISON:  Right foot radiograph 07/23/2021 FINDINGS: Bones/Joint/Cartilage There is no significant marrow signal alteration. The cortex is intact. Ligaments Intact Lisfranc ligament.  Intact MTP collateral ligaments. Muscles and Tendons Diffuse intramuscular edema and mild atrophy in the foot, as is commonly seen in diabetics. Soft tissues Diffuse soft tissue swelling dorsally. There is a plantar soft tissue ulcer along the medial forefoot. Ill-defined fluid and edema adjacent to the ulcer in the medial forefoot (axial T2 image 35, sagittal STIR image 26. IMPRESSION: Plantar soft tissue ulcer along the medial forefoot. No evidence of underlying osteomyelitis. Ill-defined fluid and edema adjacent to the ulcer, favored to be localized inflammatory change. Developing phlegmon/abscess is possible, correlate with drainage. Electronically Signed   By: Maurine Simmering M.D.   On: 07/24/2021 15:13   MR FOOT LEFT WO CONTRAST  Result Date: 07/24/2021 CLINICAL DATA:  Diabetic foot ulcer EXAM: MRI OF THE LEFT FOOT WITHOUT CONTRAST TECHNIQUE: Multiplanar, multisequence MR imaging of the left forefoot was performed. No intravenous contrast was administered. COMPARISON:  X-ray 07/23/2021, MRI 11/08/2017  FINDINGS: Bones/Joint/Cartilage Status post second ray resection to the level of the second metatarsal neck. There is also been a osteotomy of the third metatarsal head. Bone marrow edema and intermediate-low T1 signal changes within the distal second metatarsal at the resection site concerning for osteomyelitis. Marrow edema at the base of the third toe proximal phalanx, also with intermediate T1 signal is suspicious for osteomyelitis. Minimal marrow edema within the distal aspect of the third metatarsal with preservation of the T1 marrow signal. First TMT joint effusion with prominent bone marrow edema and low T1 signal changes within the first metatarsal base extending to the level of the proximal diaphysis as well as within the distal aspect of the medial cuneiform. Subchondral cysts or erosions along both sides of joint. Findings are concerning for septic arthritis with osteomyelitis. Similar findings are present at the fourth tarsometatarsal joint with suspicion of septic arthritis and osteomyelitis. No acute fracture. No dislocation. Degenerative changes are present within the forefoot. Ligaments Lisfranc ligament appears grossly intact. No evidence of acute collateral ligament injury. Muscles and Tendons Signal changes within the foot musculature suggest a combination of chronic denervation and myositis. Post amputation changes within the second toe tendons. No large tenosynovial fluid collection. Soft tissues Ulceration at the plantar aspect of the foot underlying the level of the third metatarsal head. Diffuse soft tissue edema. Focal 5 mm fluid collection underlying the plantar aspect of the residual distal second metatarsal may reflect a small abscess (series 4, image 23). IMPRESSION: 1. Ulceration at the plantar aspect of the foot underlying the level of the third metatarsal head. Evidence of osteomyelitis involving the base of the third toe proximal phalanx. 2. Signal changes within the distal second  metatarsal at the resection site are also concerning for osteomyelitis. 3. First TMT joint effusion with prominent bone marrow edema within the first metatarsal base extending to the level of the proximal diaphysis as well as within the distal aspect of the medial cuneiform. Although this could  be degenerative in etiology, findings are concerning for septic arthritis with osteomyelitis. 4. Similar findings are present at the fourth tarsometatarsal joint where there is also suspicion for septic arthritis and osteomyelitis. 5. Focal 5 mm fluid collection underlying the plantar aspect of the residual distal second metatarsal may reflect a small abscess. 6. Signal changes within the foot musculature suggest a combination of chronic denervation and myositis. These results will be called to the ordering clinician or representative by the Radiologist Assistant, and communication documented in the PACS or [employer]. Electronically Signed   By: Davina Poke D.O.   On: 07/24/2021 08:22   DG Foot Complete Left  Result Date: 07/23/2021 CLINICAL DATA:  Diabetic foot ulcers. EXAM: LEFT FOOT - COMPLETE 3+ VIEW COMPARISON:  Foot radiograph 08/07/2018 FINDINGS: Osteotomy of the second ray unchanged from prior. There is new loss of the third ray metacarpal head which may be postsurgical. There is rarefaction of the bone at the base of the proximal phalanx third digit along the medial border. Plantar ulceration the level of the metatarsal heads seen on lateral projection. No foreign body identified IMPRESSION: 1. Concern for osteomyelitis at the base of the proximal phalanx third digit. 2. Probable osteotomy of the third ray RIGHT metatarsal. Prior second ray osteotomy. 3. Soft tissue ulceration along the plantar surface of the metatarsal head row. No foreign body. Electronically Signed   By: Suzy Bouchard M.D.   On: 07/23/2021 15:10   DG Foot Complete Right  Result Date: 07/23/2021 CLINICAL DATA:  Diabetic  foot ulcer.  Evaluation for osteomyelitis EXAM: RIGHT FOOT COMPLETE - 3+ VIEW COMPARISON:  None. FINDINGS: No cortical erosion or periosteal reaction concerning for osteomyelitis. Prominent plantar calcaneal spurring. No acute fracture or dislocation. Deep skin wound about the plantar aspect of the distal foot. IMPRESSION: No radiographic evidence of osteomyelitis. If there is a deep skin wound and clinical concern for osteomyelitis, MRI examination could be obtained for further evaluation. Electronically Signed   By: Keane Police D.O.   On: 07/23/2021 15:04   VAS [place] ABI WITH/WO TBI  Result Date: 07/24/2021  LOWER EXTREMITY DOPPLER STUDY Patient Name:  ARVELL PULSIFER  Date of Exam:   07/24/2021 Medical Rec #: [id number]        Accession #:    [id number] Date of Birth: 05-28-1961       Patient Gender: M Patient Age:   29 years Exam Location:  [facility] Procedure:      VAS [place] ABI WITH/WO TBI Referring Phys: Oren Binet --------------------------------------------------------------------------------  Indications: Ulceration, gangrene, and peripheral artery disease. High Risk Factors: Hypertension, hyperlipidemia, Diabetes, current smoker. Other Factors: History of osteomyelitis, left foot 2nd ray amputation 11/15/2017.  Vascular Interventions: Left SFA atherectomy and stenting by interventional                         radiology in 03/2017. Comparison Study: No prior study Performing Technologist: Sharion Dove RVS  Examination Guidelines: A complete evaluation includes at minimum, Doppler waveform signals and systolic blood pressure reading at the level of bilateral brachial, anterior tibial, and posterior tibial arteries, when vessel segments are accessible. Bilateral testing is considered an integral part of a complete examination. Photoelectric Plethysmograph (PPG) waveforms and toe systolic pressure readings are included as required and additional duplex testing as needed. Limited examinations for  reoccurring indications may be performed as noted.  ABI Findings: +---------+------------------+-----+---------+--------+  Right     Rt Pressure (mmHg) Index  Waveform  Comment   +---------+------------------+-----+---------+--------+  Brachial  135                      triphasic           +---------+------------------+-----+---------+--------+  PTA       93                 0.67  biphasic            +---------+------------------+-----+---------+--------+  DP        91                 0.65  biphasic            +---------+------------------+-----+---------+--------+  Great Toe 56                 0.40                      +---------+------------------+-----+---------+--------+ +---------+------------------+-----+---------+-------+  Left      Lt Pressure (mmHg) Index Waveform  Comment  +---------+------------------+-----+---------+-------+  Brachial  139                      triphasic          +---------+------------------+-----+---------+-------+  PTA       100                0.72  biphasic           +---------+------------------+-----+---------+-------+  DP        105                0.76  biphasic           +---------+------------------+-----+---------+-------+  Great Toe 80                 0.58                     +---------+------------------+-----+---------+-------+ +-------+-----------+-----------+------------+------------+  ABI/TBI Today's ABI Today's TBI Previous ABI Previous TBI  +-------+-----------+-----------+------------+------------+  Right   0.67        0.40                                   +-------+-----------+-----------+------------+------------+  Left    0.76        058                                    +-------+-----------+-----------+------------+------------+  Summary: Right: Resting right ankle-brachial index indicates moderate right lower extremity arterial disease. The right toe-brachial index is abnormal. Left: Resting left ankle-brachial index indicates moderate left lower extremity arterial  disease. The left toe-brachial index is abnormal.  *See table(s) above for measurements and observations.  Electronically signed by Servando Snare MD on 07/24/2021 at 4:20:06 PM.    Final    VAS [place] LOWER EXTREMITY VENOUS (DVT)  Result Date: 07/24/2021  Lower Venous DVT Study Patient Name:  AYSEN SHIEH  Date of Exam:   07/24/2021 Medical Rec #: [id number]        Accession #:    [id number] Date of Birth: [birth year]       Patient Gender: M Patient Age:   37 years Exam Location:  [facility] Procedure:      VAS [place] LOWER EXTREMITY VENOUS (DVT) Referring Phys:  ALLISON WOLFE --------------------------------------------------------------------------------  Indications: Edema, and gangrenous ulcer of right foot.  Comparison Study: No prior study on file Performing Technologist: Sharion Dove RVS  Examination Guidelines: A complete evaluation includes B-mode imaging, spectral Doppler, color Doppler, and power Doppler as needed of all accessible portions of each vessel. Bilateral testing is considered an integral part of a complete examination. Limited examinations for reoccurring indications may be performed as noted. The reflux portion of the exam is performed with the patient in reverse Trendelenburg.  +---------+---------------+---------+-----------+----------+--------------+  RIGHT     Compressibility Phasicity Spontaneity Properties Thrombus Aging  +---------+---------------+---------+-----------+----------+--------------+  CFV       Full            Yes       Yes                                    +---------+---------------+---------+-----------+----------+--------------+  SFJ       Full                                                             +---------+---------------+---------+-----------+----------+--------------+  FV Prox   Full                                                             +---------+---------------+---------+-----------+----------+--------------+  FV Mid    Full                                                              +---------+---------------+---------+-----------+----------+--------------+  FV Distal Full                                                             +---------+---------------+---------+-----------+----------+--------------+  PFV       Full                                                             +---------+---------------+---------+-----------+----------+--------------+  POP       Full            Yes       Yes                                    +---------+---------------+---------+-----------+----------+--------------+  PTV       Full                                                             +---------+---------------+---------+-----------+----------+--------------+  PERO      Full                                                             +---------+---------------+---------+-----------+----------+--------------+   +----+---------------+---------+-----------+----------+--------------+  LEFT Compressibility Phasicity Spontaneity Properties Thrombus Aging  +----+---------------+---------+-----------+----------+--------------+  CFV  Full            Yes       Yes                                    +----+---------------+---------+-----------+----------+--------------+     Summary: RIGHT: - There is no evidence of deep vein thrombosis in the lower extremity.  - No cystic structure found in the popliteal fossa. - Ultrasound characteristics of enlarged lymph nodes are noted in the groin.  LEFT: - No evidence of common femoral vein obstruction. - Ultrasound characteristics of enlarged lymph nodes noted in the groin.  *See table(s) above for measurements and observations. Electronically signed by Servando Snare MD on 07/24/2021 at 4:19:44 PM.    Final      TODAY-DAY OF DISCHARGE:  Subjective:   Marco Collie today has no headache,no chest abdominal pain,no new weakness tingling or numbness, feels much better wants to go home today.   Objective:   Blood pressure  123/69, pulse 70, temperature 98 F (36.7 C), temperature source Oral, resp. rate 18, height 6' 3"  (1.905 m), weight 104.5 kg, SpO2 91 %.  Intake/Output Summary (Last 24 hours) at 07/26/2021 1051 Last data filed at 07/25/2021 1700 Gross per 24 hour  Intake 560 ml  Output --  Net 560 ml   Filed Weights   07/23/21 2017  Weight: 104.5 kg    Exam: Awake Alert, Oriented *3, No new F.N deficits, Normal affect [place].AT,PERRAL Supple Neck,No JVD, No cervical lymphadenopathy appriciated.  Symmetrical Chest wall movement, Good air movement bilaterally, CTAB RRR,No Gallops,Rubs or new Murmurs, No Parasternal Heave +ve B.Sounds, Abd Soft, Non tender, No organomegaly appriciated, No rebound -guarding or rigidity. No Cyanosis, Clubbing or edema, No new Rash or bruise   PERTINENT RADIOLOGIC STUDIES: MR FOOT RIGHT WO CONTRAST  Result Date: 07/24/2021 CLINICAL DATA:  Foot swelling, diabetic, osteomyelitis suspected, xray done EXAM: MRI OF THE RIGHT FOREFOOT WITHOUT CONTRAST TECHNIQUE: Multiplanar, multisequence MR imaging of the right forefoot was performed. No intravenous contrast was administered. COMPARISON:  Right foot radiograph 07/23/2021 FINDINGS: Bones/Joint/Cartilage There is no significant marrow signal alteration. The cortex is intact. Ligaments Intact Lisfranc ligament.  Intact MTP collateral ligaments. Muscles and Tendons Diffuse intramuscular edema and mild atrophy in the foot, as is commonly seen in diabetics. Soft tissues Diffuse soft tissue swelling dorsally. There is a plantar soft tissue ulcer along the medial forefoot. Ill-defined fluid and edema adjacent to the ulcer in the medial forefoot (axial T2 image 35, sagittal STIR image 26. IMPRESSION: Plantar soft tissue ulcer along the medial forefoot. No evidence of underlying osteomyelitis. Ill-defined fluid and edema adjacent to the ulcer, favored to be localized inflammatory change. Developing phlegmon/abscess is possible, correlate with  drainage. Electronically Signed   By: Maurine Simmering M.D.   On: 07/24/2021 15:13     PERTINENT LAB RESULTS: CBC: Recent Labs    07/24/21 0150 07/25/21 0245  WBC 10.0  9.0  HGB 14.9 15.9  HCT 43.1 47.4  PLT 198 140*   CMET CMP     Component Value Date/Time   NA 134 (L) 07/25/2021 0245   K 4.0 07/25/2021 0245   CL 98 07/25/2021 0245   CO2 28 07/25/2021 0245   GLUCOSE 223 (H) 07/25/2021 0245   BUN 16 07/25/2021 0245   CREATININE 1.12 07/25/2021 0245   CALCIUM 8.7 (L) 07/25/2021 0245   PROT 6.5 07/23/2021 1441   ALBUMIN 3.1 (L) 07/23/2021 1441   AST 11 (L) 07/23/2021 1441   ALT 12 07/23/2021 1441   ALKPHOS 93 07/23/2021 1441   BILITOT 0.5 07/23/2021 1441   GFRNONAA >60 07/25/2021 0245   GFRAA >60 11/14/2017 0936    GFR Estimated Creatinine Clearance: 89.5 mL/min (by C-G formula based on SCr of 1.12 mg/dL). No results for input(s): LIPASE, AMYLASE in the last 72 hours. No results for input(s): CKTOTAL, CKMB, CKMBINDEX, TROPONINI in the last 72 hours. Invalid input(s): POCBNP No results for input(s): DDIMER in the last 72 hours. Recent Labs    07/23/21 1441  HGBA1C 12.5*   Recent Labs    07/24/21 0150  CHOL 131  HDL 28*  LDLCALC 48  TRIG 273*  CHOLHDL 4.7   No results for input(s): TSH, T4TOTAL, T3FREE, THYROIDAB in the last 72 hours.  Invalid input(s): FREET3 No results for input(s): VITAMINB12, FOLATE, FERRITIN, TIBC, IRON, RETICCTPCT in the last 72 hours. Coags: No results for input(s): INR in the last 72 hours.  Invalid input(s): PT Microbiology: Recent Results (from the past 240 hour(s))  Resp Panel by RT-PCR (Flu A&B, Covid) Nasopharyngeal Swab     Status: None   Collection Time: 07/23/21  3:46 PM   Specimen: Nasopharyngeal Swab; Nasopharyngeal(NP) swabs in vial transport medium  Result Value Ref Range Status   SARS Coronavirus 2 by RT PCR NEGATIVE NEGATIVE Final    Comment: (NOTE) SARS-CoV-2 target nucleic acids are NOT DETECTED.  The SARS-CoV-2  RNA is generally detectable in upper respiratory specimens during the acute phase of infection. The lowest concentration of SARS-CoV-2 viral copies this assay can detect is 138 copies/mL. A negative result does not preclude SARS-Cov-2 infection and should not be used as the sole basis for treatment or other patient management decisions. A negative result may occur with  improper specimen collection/handling, submission of specimen other than nasopharyngeal swab, presence of viral mutation(s) within the areas targeted by this assay, and inadequate number of viral copies(<138 copies/mL). A negative result must be combined with clinical observations, patient history, and epidemiological information. The expected result is Negative.  Fact Sheet for Patients:  EntrepreneurPulse.com.au  Fact Sheet for Healthcare Providers:  IncredibleEmployment.be  This test is no t yet approved or cleared by the [place] FDA and  has been authorized for detection and/or diagnosis of SARS-CoV-2 by FDA under an Emergency Use Authorization (EUA). This EUA will remain  in effect (meaning this test can be used) for the duration of the COVID-19 declaration under Section 564(b)(1) of the Act, 21 U.S.C.section 360bbb-3(b)(1), unless the authorization is terminated  or revoked sooner.       Influenza A by PCR NEGATIVE NEGATIVE Final   Influenza B by PCR NEGATIVE NEGATIVE Final    Comment: (NOTE) The Xpert Xpress SARS-CoV-2/FLU/RSV plus assay is intended as an aid in the diagnosis of influenza from Nasopharyngeal swab specimens and should not be used as a sole basis for treatment. Nasal washings and aspirates are unacceptable for Xpert Xpress SARS-CoV-2/FLU/RSV  testing.  Fact Sheet for Patients: EntrepreneurPulse.com.au  Fact Sheet for Healthcare Providers: IncredibleEmployment.be  This test is not yet approved or cleared by the  [place] FDA and has been authorized for detection and/or diagnosis of SARS-CoV-2 by FDA under an Emergency Use Authorization (EUA). This EUA will remain in effect (meaning this test can be used) for the duration of the COVID-19 declaration under Section 564(b)(1) of the Act, 21 U.S.C. section 360bbb-3(b)(1), unless the authorization is terminated or revoked.  Performed at [facility], [place] [address], [place], [place] [address]   Aerobic Culture w Gram Stain (superficial specimen)     Status: None   Collection Time: 07/23/21  4:20 PM   Specimen: Wound  Result Value Ref Range Status   Specimen Description WOUND  Final   Special Requests RIGHT FOOT  Final   Gram Stain   Final    RARE WBC PRESENT, PREDOMINANTLY MONONUCLEAR FEW GRAM POSITIVE COCCI IN PAIRS AND CHAINS    Culture   Final    FEW STREPTOCOCCUS AGALACTIAE TESTING AGAINST S. AGALACTIAE NOT ROUTINELY PERFORMED DUE TO PREDICTABILITY OF AMP/PEN/VAN SUSCEPTIBILITY. WITHIN MIXED ORGANISMS Performed at [facility], [place] [address], [place], [place] [address]    Report Status 07/25/2021 FINAL  Final  Aerobic Culture w Gram Stain (superficial specimen)     Status: None   Collection Time: 07/23/21  4:20 PM   Specimen: Wound  Result Value Ref Range Status   Specimen Description WOUND  Final   Special Requests LEFT FOOT  Final   Gram Stain   Final    NO WBC SEEN MODERATE GRAM POSITIVE COCCI FEW GRAM POSITIVE RODS    Culture   Final    ABUNDANT STREPTOCOCCUS AGALACTIAE TESTING AGAINST S. AGALACTIAE NOT ROUTINELY PERFORMED DUE TO PREDICTABILITY OF AMP/PEN/VAN SUSCEPTIBILITY. WITHIN MIXED ORGANISMS Performed at [facility], [place] [address] [place], [place] [address]    Report Status 07/25/2021 FINAL  Final    FURTHER DISCHARGE INSTRUCTIONS:  Get Medicines reviewed and adjusted: Please take all your medications with you for your next visit with your Primary MD  Laboratory/radiological  data: Please request your Primary MD to go over all hospital tests and procedure/radiological results at the follow up, please ask your Primary MD to get all Hospital records sent to his/her office.  In some cases, they will be blood work, cultures and biopsy results pending at the time of your discharge. Please request that your primary care M.D. goes through all the records of your hospital data and follows up on these results.  Also Note the following: If you experience worsening of your admission symptoms, develop shortness of breath, life threatening emergency, suicidal or homicidal thoughts you must seek medical attention immediately by calling 911 or calling your MD immediately  if symptoms less severe.  You must read complete instructions/literature along with all the possible adverse reactions/side effects for all the Medicines you take and that have been prescribed to you. Take any new Medicines after you have completely understood and accpet all the possible adverse reactions/side effects.   Do not drive when taking Pain medications or sleeping medications (Benzodaizepines)  Do not take more than prescribed Pain, Sleep and Anxiety Medications. It is not advisable to combine anxiety,sleep and pain medications without talking with your primary care practitioner  Special Instructions: If you have smoked or chewed Tobacco  in the last 2 yrs please stop smoking, stop any regular Alcohol  and or any Recreational  drug use.  Wear Seat belts while driving.  Please note: You were cared for by a hospitalist during your hospital stay. Once you are discharged, your primary care physician will handle any further medical issues. Please note that NO REFILLS for any discharge medications will be authorized once you are discharged, as it is imperative that you return to your primary care physician (or establish a relationship with a primary care physician if you do not have one) for your post hospital  discharge needs so that they can reassess your need for medications and monitor your lab values.  Total Time spent coordinating discharge including counseling, education and face to face time equals 35 minutes.  SignedOren Binet 07/26/2021 10:51 AM

## 2021-07-28 ENCOUNTER — Encounter (HOSPITAL_COMMUNITY): Admission: RE | Payer: Self-pay | Source: Ambulatory Visit

## 2021-07-28 ENCOUNTER — Ambulatory Visit (HOSPITAL_COMMUNITY): Admission: RE | Admit: 2021-07-28 | Payer: 59 | Source: Ambulatory Visit | Admitting: Vascular Surgery

## 2021-07-28 SURGERY — ABDOMINAL AORTOGRAM W/LOWER EXTREMITY
Anesthesia: LOCAL

## 2021-08-11 ENCOUNTER — Ambulatory Visit (HOSPITAL_COMMUNITY)
Admission: RE | Admit: 2021-08-11 | Discharge: 2021-08-11 | Disposition: A | Discharge status: Home / Self Care | Payer: 59 | Attending: Vascular Surgery | Admitting: Vascular Surgery

## 2021-08-11 ENCOUNTER — Encounter (HOSPITAL_COMMUNITY)
Admission: RE | Disposition: A | Discharge status: Home / Self Care | Payer: Self-pay | Source: Home / Self Care | Attending: Vascular Surgery

## 2021-08-11 ENCOUNTER — Encounter (HOSPITAL_COMMUNITY): Payer: Self-pay | Admitting: Vascular Surgery

## 2021-08-11 ENCOUNTER — Other Ambulatory Visit (HOSPITAL_COMMUNITY): Payer: Self-pay

## 2021-08-11 DIAGNOSIS — Z7902 Long term (current) use of antithrombotics/antiplatelets: Secondary | ICD-10-CM | POA: Diagnosis not present

## 2021-08-11 DIAGNOSIS — L97519 Non-pressure chronic ulcer of other part of right foot with unspecified severity: Secondary | ICD-10-CM | POA: Insufficient documentation

## 2021-08-11 DIAGNOSIS — I10 Essential (primary) hypertension: Secondary | ICD-10-CM | POA: Diagnosis not present

## 2021-08-11 DIAGNOSIS — Z79899 Other long term (current) drug therapy: Secondary | ICD-10-CM | POA: Insufficient documentation

## 2021-08-11 DIAGNOSIS — I70235 Atherosclerosis of native arteries of right leg with ulceration of other part of foot: Secondary | ICD-10-CM | POA: Insufficient documentation

## 2021-08-11 DIAGNOSIS — F17210 Nicotine dependence, cigarettes, uncomplicated: Secondary | ICD-10-CM | POA: Insufficient documentation

## 2021-08-11 DIAGNOSIS — Z7982 Long term (current) use of aspirin: Secondary | ICD-10-CM | POA: Insufficient documentation

## 2021-08-11 DIAGNOSIS — E1151 Type 2 diabetes mellitus with diabetic peripheral angiopathy without gangrene: Secondary | ICD-10-CM | POA: Diagnosis present

## 2021-08-11 DIAGNOSIS — I70245 Atherosclerosis of native arteries of left leg with ulceration of other part of foot: Secondary | ICD-10-CM | POA: Diagnosis not present

## 2021-08-11 HISTORY — PX: ABDOMINAL AORTOGRAM W/LOWER EXTREMITY: CATH118223

## 2021-08-11 HISTORY — PX: PERIPHERAL VASCULAR INTERVENTION: CATH118257

## 2021-08-11 LAB — POCT I-STAT, CHEM 8
BUN: 14 mg/dL (ref 8–23)
Calcium, Ion: 1.21 mmol/L (ref 1.15–1.40)
Chloride: 96 mmol/L — ABNORMAL LOW (ref 98–111)
Creatinine, Ser: 0.9 mg/dL (ref 0.61–1.24)
Glucose, Bld: 158 mg/dL — ABNORMAL HIGH (ref 70–99)
HCT: 44 % (ref 39.0–52.0)
Hemoglobin: 15 g/dL (ref 13.0–17.0)
Potassium: 3.8 mmol/L (ref 3.5–5.1)
Sodium: 139 mmol/L (ref 135–145)
TCO2: 35 mmol/L — ABNORMAL HIGH (ref 22–32)

## 2021-08-11 LAB — GLUCOSE, CAPILLARY: Glucose-Capillary: 175 mg/dL — ABNORMAL HIGH (ref 70–99)

## 2021-08-11 SURGERY — ABDOMINAL AORTOGRAM W/LOWER EXTREMITY
Anesthesia: LOCAL | Laterality: Right

## 2021-08-11 MED ORDER — ACETAMINOPHEN 325 MG PO TABS: 650.0000 mg | ORAL_TABLET | ORAL | Status: DC | PRN

## 2021-08-11 MED ORDER — ONDANSETRON HCL 4 MG/2ML IJ SOLN: 4.0000 mg | Freq: Four times a day (QID) | INTRAMUSCULAR | Status: DC | PRN

## 2021-08-11 MED ORDER — PROTAMINE SULFATE 10 MG/ML IV SOLN
INTRAVENOUS | Status: DC | PRN
  Administered 2021-08-11: 10 mg via INTRAVENOUS
  Administered 2021-08-11: 15 mg via INTRAVENOUS

## 2021-08-11 MED ORDER — HEPARIN (PORCINE) IN NACL 1000-0.9 UT/500ML-% IV SOLN
INTRAVENOUS | Status: AC
  Filled 2021-08-11: qty 1000

## 2021-08-11 MED ORDER — HEPARIN (PORCINE) IN NACL 1000-0.9 UT/500ML-% IV SOLN
INTRAVENOUS | Status: DC | PRN
  Administered 2021-08-11 (×2): 500 mL

## 2021-08-11 MED ORDER — FENTANYL CITRATE (PF) 100 MCG/2ML IJ SOLN
INTRAMUSCULAR | Status: AC
  Filled 2021-08-11: qty 2

## 2021-08-11 MED ORDER — HEPARIN SODIUM (PORCINE) 1000 UNIT/ML IJ SOLN
INTRAMUSCULAR | Status: DC | PRN
  Administered 2021-08-11: 10000 [IU] via INTRAVENOUS

## 2021-08-11 MED ORDER — LIDOCAINE HCL (PF) 1 % IJ SOLN
INTRAMUSCULAR | Status: AC
  Filled 2021-08-11: qty 30

## 2021-08-11 MED ORDER — HEPARIN SODIUM (PORCINE) 1000 UNIT/ML IJ SOLN
INTRAMUSCULAR | Status: AC
  Filled 2021-08-11: qty 10

## 2021-08-11 MED ORDER — LIDOCAINE HCL (PF) 1 % IJ SOLN
INTRAMUSCULAR | Status: DC | PRN
  Administered 2021-08-11: 15 mL via INTRADERMAL

## 2021-08-11 MED ORDER — SODIUM CHLORIDE 0.9 % IV SOLN: INTRAVENOUS | Status: DC

## 2021-08-11 MED ORDER — SODIUM CHLORIDE 0.9% FLUSH: 3.0000 mL | INTRAVENOUS | Status: DC | PRN

## 2021-08-11 MED ORDER — SODIUM CHLORIDE 0.9 % IV SOLN: 250.0000 mL | INTRAVENOUS | Status: DC | PRN

## 2021-08-11 MED ORDER — SODIUM CHLORIDE 0.9 % WEIGHT BASED INFUSION: 1.0000 mL/kg/h | INTRAVENOUS | Status: DC

## 2021-08-11 MED ORDER — PROTAMINE SULFATE 10 MG/ML IV SOLN
INTRAVENOUS | Status: AC
  Filled 2021-08-11: qty 5

## 2021-08-11 MED ORDER — HYDRALAZINE HCL 20 MG/ML IJ SOLN: 5.0000 mg | INTRAMUSCULAR | Status: DC | PRN

## 2021-08-11 MED ORDER — FENTANYL CITRATE (PF) 100 MCG/2ML IJ SOLN
INTRAMUSCULAR | Status: DC | PRN
  Administered 2021-08-11 (×2): 50 ug via INTRAVENOUS

## 2021-08-11 MED ORDER — ASPIRIN 81 MG PO TBEC
81.0000 mg | DELAYED_RELEASE_TABLET | Freq: Every day | ORAL | 2 refills | Status: AC
  Filled 2021-08-11: qty 30, 30d supply, fill #0

## 2021-08-11 MED ORDER — LABETALOL HCL 5 MG/ML IV SOLN: 10.0000 mg | INTRAVENOUS | Status: DC | PRN

## 2021-08-11 MED ORDER — MIDAZOLAM HCL 2 MG/2ML IJ SOLN
INTRAMUSCULAR | Status: DC | PRN
  Administered 2021-08-11: 1 mg via INTRAVENOUS

## 2021-08-11 MED ORDER — MIDAZOLAM HCL 2 MG/2ML IJ SOLN
INTRAMUSCULAR | Status: AC
  Filled 2021-08-11: qty 2

## 2021-08-11 MED ORDER — SODIUM CHLORIDE 0.9% FLUSH: 3.0000 mL | Freq: Two times a day (BID) | INTRAVENOUS | Status: DC

## 2021-08-11 SURGICAL SUPPLY — 20 items
BALLN MUSTANG 6X200X135 (BALLOONS) ×3
BALLOON MUSTANG 6X200X135 (BALLOONS) IMPLANT
CATH MUSTANG 4X200X135 (BALLOONS) ×1 IMPLANT
CATH OMNI FLUSH 5F 65CM (CATHETERS) ×1 IMPLANT
CATH QUICKCROSS .035X135CM (MICROCATHETER) ×1 IMPLANT
CLOSURE PERCLOSE PROSTYLE (VASCULAR PRODUCTS) ×2 IMPLANT
GLIDEWIRE ADV .035X260CM (WIRE) ×1 IMPLANT
KIT ENCORE 26 ADVANTAGE (KITS) ×1 IMPLANT
KIT MICROPUNCTURE NIT STIFF (SHEATH) ×1 IMPLANT
KIT PV (KITS) ×3 IMPLANT
SHEATH PINNACLE 5F 10CM (SHEATH) ×1 IMPLANT
SHEATH PINNACLE ST 6F 45CM (SHEATH) ×1 IMPLANT
SHEATH PROBE COVER 6X72 (BAG) ×1 IMPLANT
STENT ELUVIA 7X120X130 (Permanent Stent) ×1 IMPLANT
STENT ELUVIA 7X150X130 (Permanent Stent) ×2 IMPLANT
SYR MEDRAD MARK V 150ML (SYRINGE) ×1 IMPLANT
TRANSDUCER W/STOPCOCK (MISCELLANEOUS) ×3 IMPLANT
TRAY PV CATH (CUSTOM PROCEDURE TRAY) ×3 IMPLANT
WIRE BENTSON .035X145CM (WIRE) ×2 IMPLANT
WIRE HI TORQ VERSACORE J 260CM (WIRE) ×1 IMPLANT

## 2021-08-11 NOTE — Op Note (Addendum)
DATE OF SERVICE: 08/11/2021  PATIENT:  Cody Pena  60 y.o. 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 Lenell Antu, MD  ASSISTANT: none  ANESTHESIA:   local and IV sedation  ESTIMATED BLOOD LOSS: minimal  LOCAL MEDICATIONS USED:  LIDOCAINE   COUNTS: confirmed correct.  PATIENT DISPOSITION:  PACU - hemodynamically stable.   Delay start of Pharmacological VTE agent (>24hrs) due to surgical blood loss or risk of bleeding: no  INDICATION FOR PROCEDURE: [name] is a 60 y.o. male with bilateral plantar ulceration.  His noninvasive testing suggested significant peripheral arterial disease bilaterally he underwent angioplasty and stenting of the left superficial femoral artery in 2018 with interventional radiology. After careful discussion of risks, benefits, and alternatives the patient was offered angiogram. The patient understood and wished to proceed.  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 rapidly tapers to occlusion in the  proximal thigh. Popliteal artery: Reconstitution above the knee via large collateral, patent through the knee to the confluence of the anterior tibial artery Anterior tibial artery: Chronically occluded at its origin Tibioperoneal trunk: Widely patent without flow-limiting stenosis Peroneal artery: Patent without flow-limiting stenosis, coursing to the ankle Posterior tibial artery: Dominant tibial, coursing to the foot filling the pedal circulation Pedal circulation: Robust via the posterior tibial artery  Left lower extremity: Common femoral artery: Patent without flow-limiting stenosis Profunda femoris artery: Patent without flow-limiting stenosis Superficial femoral artery: Flush occlusion at the origin.  Prior stenting is occluded.   Popliteal artery: Reconstitution above the knee, patent through the knee to the confluence of the anterior tibial artery Anterior tibial artery: Chronically occluded at its origin Tibioperoneal trunk: Widely patent without flow-limiting stenosis Peroneal artery: Patent without flow-limiting stenosis, coursing to the ankle Posterior tibial artery: Dominant tibial, coursing to the foot filling the pedal circulation Pedal circulation: Robust via the posterior tibial artery  GLASS score. Grade 3: expect 53% 1 year re-intervention rate; expect 69% 5 year re-intervention rate; expect 49% 5 year mortality; expect 61% rate of restenosis from open operation at 5 years; expect 83% rate of restenosis from endovascular procedure.   WIfI score 2 / 1 / 0; clinical stage 3. Moderate amputation risk. Moderate potential benefit from revascularization.  DESCRIPTION OF PROCEDURE: After identification of the patient in the pre-operative holding area, the patient was transferred to the operating room. The patient was positioned supine on the operating room table. Anesthesia was induced. The groins was prepped and draped in standard fashion. A surgical pause was performed  confirming correct patient, procedure, and operative location.  The left groin was anesthetized with subcutaneous injection of 1% lidocaine. Using ultrasound guidance, the left common femoral artery was accessed with micropuncture technique. Fluoroscopy was used to confirm cannulation over the femoral head. The 46F sheath  was upsized to 510F.   A Benson wire was advanced into the distal aorta. Over the wire an omni flush catheter was advanced to the level of L2. Aortogram was performed - see above for details. Bilateral lower extremity runoff angiography was performed - see above for details.  The right common iliac artery was selected with a glidewire advantage guidewire. The wire was advanced into the common femoral artery. Over the wire the omni flush catheter was advanced into the external iliac artery. Selective angiography was performed - see above for details.   The decision was made to intervene. The patient was heparinized with 10,000 units of heparin. The 510F sheath was exchanged for a 49F x 45cm sheath. Selective angiography of the left lower extremity was performed prior to intervention.  The long segment femoral-popliteal occlusion was crossed with a Glidewire advantage and quick cross catheter subintimal technique.  We were able to reenter above the knee.  Confirmatory angiography showed true lumen position distal to the occlusion  The lesions were treated with: Stenting and angioplasty.  The lesion was predilated with a 4 x 200 mm Mustang balloon.  7 x 150 mm, 7 x 150 mm and 7 x 120 mm Eluvia stents were delivered across the lesion in standard technique.  The stents were postdilated with a 6 x 200 mm Mustang balloon  Completion angiography revealed:  Resolution of the occlusion and restoration of inline flow to the foot  2 Perclose devices were used to close the arteriotomy. Hemostasis was excellent upon completion.  Conscious sedation was administered with the use of IV fentanyl and  midazolam under continuous physician and nurse monitoring.  Heart rate, blood pressure, and oxygen saturation were continuously monitored.  Total sedation time was 79 minutes  Upon completion of the case instrument and sharps counts were confirmed correct. The patient was transferred to the PACU in good condition. I was present for all portions of the procedure.  PLAN: Continue dual antiplatelet therapy with aspirin 81 mg by mouth daily.  Continue Plavix 75 g by mouth daily.  Continue high intensity statin therapy.  He is cleared for any kind of surgery on the right foot.  I will bring him back to the office on Tuesday with an ankle-brachial index to review options for the left lower extremity.  He would likely benefit from a left common femoral to popliteal artery bypass given previous failed stenting.  Rande Brunt. Lenell Antu, MD [facility] Phone Number: [phone] 08/11/2021 9:00 AM

## 2021-08-11 NOTE — Interval H&P Note (Signed)
History and Physical Interval Note:  08/11/2021 7:20 AM  Cody Pena  has presented today for surgery, with the diagnosis of foot infection.  The various methods of treatment have been discussed with the patient and family. After consideration of risks, benefits and other options for treatment, the patient has consented to  Procedure(s): ABDOMINAL AORTOGRAM W/LOWER EXTREMITY (N/A) as a surgical intervention.  The patient's history has been reviewed, patient examined, no change in status, stable for surgery.  I have reviewed the patient's chart and labs.  Questions were answered to the patient's satisfaction.     Leonie Douglas

## 2021-08-15 ENCOUNTER — Telehealth: Payer: Self-pay

## 2021-08-15 NOTE — Telephone Encounter (Signed)
Patient left VM on triage line. Would like for [place] to call him back.   Called patient back no answer LMOM to return our call.

## 2021-08-16 ENCOUNTER — Encounter: Payer: Self-pay | Admitting: Family

## 2021-08-16 ENCOUNTER — Other Ambulatory Visit: Payer: Self-pay

## 2021-08-16 ENCOUNTER — Ambulatory Visit: Payer: 59 | Admitting: Family

## 2021-08-16 DIAGNOSIS — I739 Peripheral vascular disease, unspecified: Secondary | ICD-10-CM | POA: Diagnosis not present

## 2021-08-16 DIAGNOSIS — M869 Osteomyelitis, unspecified: Secondary | ICD-10-CM | POA: Diagnosis not present

## 2021-08-16 MED ORDER — AMOXICILLIN-POT CLAVULANATE 875-125 MG PO TABS: 1.0000 | ORAL_TABLET | Freq: Two times a day (BID) | ORAL | 0 refills | Status: DC

## 2021-08-16 NOTE — Progress Notes (Signed)
Office Visit Note   Patient: Cody Pena           Date of Birth: 1960/11/25           MRN: [id number] Visit Date: 08/16/2021              Requested by: Tamsen Roers, [place],  [place] [address] PCP: Tamsen Roers, MD  Chief Complaint  Patient presents with   Right Foot - Open Wound      HPI: The patient is a 61 year old gentleman seen today in hospitalization follow-up he has been having issues with an ulcer for about a month to the bottom of his right foot.  He is diabetic.  During hospitalization he did have revascularization of the right lower extremity on consultation Dr. Sharol Given noted and exposed second metatarsal head and the plantar wound to his right foot  Today he is having a foul odor significant pain to the wound  Assessment & Plan: Visit Diagnoses:  1. PAD (peripheral artery disease) ([place])   2. Osteomyelitis of right foot, unspecified type ([place])     Plan: The wound size is significantly larger.  We will discuss with Dr. Sharol Given and make plans for surgical intervention.  We will plan for office follow-up on Monday to be evaluated by Dr. Sharol Given  In the interim have placed him on doxycycline.  Discussed return precautions.  We will reach out to vascular as it appears they have not yet scheduled follow-up to examine his left lower extremity  Follow-Up Instructions: No follow-ups on file.   Ortho Exam  Patient is alert, oriented, no adenopathy, well-dressed, normal affect, normal respiratory effort. On examination of the right foot there is a significant plantar ulceration extending into the first webspace.  There is necrotic tissue in the wound bed.  Please see attached photo there is no active drainage no surrounding erythema no cellulitis  Imaging: No results found.   Labs: Lab Results  Component Value Date   HGBA1C 12.5 (H) 07/23/2021   HGBA1C 7.2 (H) 11/10/2017   HGBA1C 8.1 (H) 12/07/2014   ESRSEDRATE 60 (H) 11/10/2017   ESRSEDRATE 20 (H)  12/07/2014   CRP 3.2 (H) 11/10/2017   CRP 7.0 (H) 12/07/2014   REPTSTATUS 07/25/2021 FINAL 07/23/2021   REPTSTATUS 07/25/2021 FINAL 07/23/2021   GRAMSTAIN  07/23/2021    RARE WBC PRESENT, PREDOMINANTLY MONONUCLEAR FEW GRAM POSITIVE COCCI IN PAIRS AND CHAINS    GRAMSTAIN  07/23/2021    NO WBC SEEN MODERATE GRAM POSITIVE COCCI FEW GRAM POSITIVE RODS    CULT  07/23/2021    FEW STREPTOCOCCUS AGALACTIAE TESTING AGAINST S. AGALACTIAE NOT ROUTINELY PERFORMED DUE TO PREDICTABILITY OF AMP/PEN/VAN SUSCEPTIBILITY. WITHIN MIXED ORGANISMS Performed at [facility], [place] [address] [place], [place] [address]    CULT  07/23/2021    ABUNDANT STREPTOCOCCUS AGALACTIAE TESTING AGAINST S. AGALACTIAE NOT ROUTINELY PERFORMED DUE TO PREDICTABILITY OF AMP/PEN/VAN SUSCEPTIBILITY. WITHIN MIXED ORGANISMS Performed at [facility], [place] [address], [place], [place] [address]      Lab Results  Component Value Date   ALBUMIN 3.1 (L) 07/23/2021   ALBUMIN 2.9 (L) 11/14/2017   ALBUMIN 2.9 (L) 11/11/2017    Lab Results  Component Value Date   MG 2.0 12/07/2014   No results found for: VD25OH  No results found for: PREALBUMIN CBC EXTENDED Latest Ref Rng & Units 08/11/2021 07/25/2021 07/24/2021  WBC 4.0 - 10.5 K/uL - 9.0 10.0  RBC 4.22 -  5.81 MIL/uL - 4.93 4.54  HGB 13.0 - 17.0 g/dL 15.0 15.9 14.9  HCT 39.0 - 52.0 % 44.0 47.4 43.1  PLT 150 - 400 K/uL - 140(L) 198  NEUTROABS 1.7 - 7.7 K/uL - - -  LYMPHSABS 0.7 - 4.0 K/uL - - -     There is no height or weight on file to calculate BMI.  Orders:  Orders Placed This Encounter  Procedures   Ambulatory referral to Vascular Surgery   No orders of the defined types were placed in this encounter.    Procedures: No procedures performed  Clinical Data: No additional findings.  ROS:  All other systems negative, except as noted in the HPI. Review of Systems  Objective: Vital Signs: There were no vitals taken for this  visit.  Specialty Comments:  No specialty comments available.  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 ([place]) 01/14/2018   Ulcer of left foot, limited to breakdown of skin ([place]) 01/14/2018   Foot osteomyelitis, left ([place])    Septic arthritis of foot ([place]) 11/11/2017   Osteomyelitis of ankle or foot, acute, left ([place]) 11/11/2017   Type 2 DM with diabetic peripheral angiopathy w/o gangrene ([place]) 11/11/2017   Cigarette smoker 11/11/2017   Diabetic peripheral neuropathy ([place]) 11/11/2017   Diabetic foot infection ([place]) 11/10/2017   Post op infection    Delirium 12/07/2014   Cervical vertebral fusion 12/07/2014   Hallucination 12/07/2014   Mood disorder ([place]) 12/07/2014   Pyrexia    Neck swelling    Past Medical History:  Diagnosis Date   Diabetes mellitus without complication ([place])    Hypertension     History reviewed. No pertinent family history.  Past Surgical History:  Procedure Laterality Date   ABDOMINAL AORTOGRAM W/LOWER EXTREMITY N/A 08/11/2021   Procedure: ABDOMINAL AORTOGRAM W/LOWER EXTREMITY;  Surgeon: Cherre Robins, MD;  Location: [facility];  Service: Cardiovascular;  Laterality: N/A;   AMPUTATION Left 11/15/2017   Procedure: LEFT FOOT 2ND RAY AMPUTATION;  Surgeon: Newt Minion, MD;  Location: [facility];  Service: Orthopedics;  Laterality: Left;   CERVICAL FUSION     IR ANGIOGRAM EXTREMITY LEFT  11/14/2017   IR ANGIOGRAM SELECTIVE EACH ADDITIONAL VESSEL  11/14/2017   IR ANGIOGRAM SELECTIVE EACH ADDITIONAL VESSEL  11/14/2017   IR TIB-PERO ART PTA MOD SED  11/14/2017   IR TIB-PERO ART UNI PTA EA ADD VESSEL MOD SED  11/14/2017   IR US GUIDE VASC ACCESS LEFT  11/14/2017   PERIPHERAL VASCULAR INTERVENTION Right 08/11/2021   Procedure: PERIPHERAL VASCULAR  INTERVENTION;  Surgeon: Cherre Robins, MD;  Location: [facility];  Service: Cardiovascular;  Laterality: Right;  rt sfa stent   Social History   Occupational History   Not on file  Tobacco Use   Smoking status: Every Day   Smokeless tobacco: Never  Substance and Sexual Activity   Alcohol use: No   Drug use: No   Sexual activity: Not on file

## 2021-08-17 ENCOUNTER — Encounter (HOSPITAL_COMMUNITY): Payer: Self-pay | Admitting: Physician Assistant

## 2021-08-18 ENCOUNTER — Encounter (HOSPITAL_COMMUNITY): Admission: RE | Payer: Self-pay | Source: Home / Self Care

## 2021-08-18 ENCOUNTER — Ambulatory Visit (HOSPITAL_COMMUNITY): Admission: RE | Admit: 2021-08-18 | Payer: 59 | Source: Home / Self Care | Admitting: Orthopedic Surgery

## 2021-08-18 SURGERY — AMPUTATION BELOW KNEE
Anesthesia: Choice | Site: Knee | Laterality: Right

## 2021-08-22 ENCOUNTER — Encounter: Payer: Self-pay | Admitting: Orthopedic Surgery

## 2021-08-22 ENCOUNTER — Ambulatory Visit (INDEPENDENT_AMBULATORY_CARE_PROVIDER_SITE_OTHER): Payer: 59 | Admitting: Orthopedic Surgery

## 2021-08-22 ENCOUNTER — Encounter: Payer: 59 | Admitting: Vascular Surgery

## 2021-08-22 ENCOUNTER — Encounter (HOSPITAL_COMMUNITY): Payer: 59

## 2021-08-22 DIAGNOSIS — M869 Osteomyelitis, unspecified: Secondary | ICD-10-CM | POA: Diagnosis not present

## 2021-08-22 DIAGNOSIS — I739 Peripheral vascular disease, unspecified: Secondary | ICD-10-CM | POA: Diagnosis not present

## 2021-08-22 NOTE — Progress Notes (Signed)
Office Visit Note   Patient: Cody Pena           Date of Birth: [birth year]           MRN: [id number] Visit Date: 08/22/2021              Requested by: Tamsen Roers, MD [place],  [place] [address] PCP: Tamsen Roers, MD  Chief Complaint  Patient presents with   Right Foot - Follow-up      HPI: Patient is a 61 year old gentleman with a chronic ulcer beneath the right forefoot.  Patient states he feels about the same still has drainage with odor is still on Augmentin.  Assessment & Plan: Visit Diagnoses:  1. Osteomyelitis of right foot, unspecified type ([place])   2. PAD (peripheral artery disease) ([place])     Plan: With the exposed first and second metatarsal head discussed with the patient his best option would be to proceed with a transmetatarsal amputation.  With his peripheral vascular disease he does have a increased risk of wound healing complications.  We will plan for surgery on Friday with overnight observation and discharge on Saturday with protected weightbearing.  Follow-Up Instructions: Return in about 1 week (around 08/29/2021).   Ortho Exam  Patient is alert, oriented, no adenopathy, well-dressed, normal affect, normal respiratory effort. Examination patient has a palpable dorsalis pedis pulse he has a large 3 cm diameter ulcer beneath the first and second metatarsal heads.  This ulcer probes to bone both the second and first metatarsals.  There is no ascending cellulitis.  Imaging: No results found.   Labs: Lab Results  Component Value Date   HGBA1C 12.5 (H) 07/23/2021   HGBA1C 7.2 (H) 11/10/2017   HGBA1C 8.1 (H) 12/07/2014   ESRSEDRATE 60 (H) 11/10/2017   ESRSEDRATE 20 (H) 12/07/2014   CRP 3.2 (H) 11/10/2017   CRP 7.0 (H) 12/07/2014   REPTSTATUS 07/25/2021 FINAL 07/23/2021   REPTSTATUS 07/25/2021 FINAL 07/23/2021   GRAMSTAIN  07/23/2021    RARE WBC PRESENT, PREDOMINANTLY MONONUCLEAR FEW GRAM POSITIVE COCCI IN PAIRS AND CHAINS     GRAMSTAIN  07/23/2021    NO WBC SEEN MODERATE GRAM POSITIVE COCCI FEW GRAM POSITIVE RODS    CULT  07/23/2021    FEW STREPTOCOCCUS AGALACTIAE TESTING AGAINST S. AGALACTIAE NOT ROUTINELY PERFORMED DUE TO PREDICTABILITY OF AMP/PEN/VAN SUSCEPTIBILITY. WITHIN MIXED ORGANISMS Performed at [facility], [place] [address] [place], [place] [address]    CULT  07/23/2021    ABUNDANT STREPTOCOCCUS AGALACTIAE TESTING AGAINST S. AGALACTIAE NOT ROUTINELY PERFORMED DUE TO PREDICTABILITY OF AMP/PEN/VAN SUSCEPTIBILITY. WITHIN MIXED ORGANISMS Performed at [facility], [place] [address] [place], [place] [address]      Lab Results  Component Value Date   ALBUMIN 3.1 (L) 07/23/2021   ALBUMIN 2.9 (L) 11/14/2017   ALBUMIN 2.9 (L) 11/11/2017    Lab Results  Component Value Date   MG 2.0 12/07/2014   No results found for: VD25OH  No results found for: PREALBUMIN CBC EXTENDED Latest Ref Rng & Units 08/11/2021 07/25/2021 07/24/2021  WBC 4.0 - 10.5 K/uL - 9.0 10.0  RBC 4.22 - 5.81 MIL/uL - 4.93 4.54  HGB 13.0 - 17.0 g/dL 15.0 15.9 14.9  HCT 39.0 - 52.0 % 44.0 47.4 43.1  PLT 150 - 400 K/uL - 140(L) 198  NEUTROABS 1.7 - 7.7 K/uL - - -  LYMPHSABS 0.7 - 4.0 K/uL - - -     There is no  height or weight on file to calculate BMI.  Orders:  No orders of the defined types were placed in this encounter.  No orders of the defined types were placed in this encounter.    Procedures: No procedures performed  Clinical Data: No additional findings.  ROS:  All other systems negative, except as noted in the HPI. Review of Systems  Objective: Vital Signs: There were no vitals taken for this visit.  Specialty Comments:  No specialty comments available.  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 ([place]) 01/14/2018   Ulcer of left foot, limited to breakdown of skin ([place]) 01/14/2018   Foot osteomyelitis, left ([place])    Septic arthritis of foot ([place]) 11/11/2017   Osteomyelitis of ankle or foot, acute, left ([place]) 11/11/2017   Type 2 DM with diabetic peripheral angiopathy w/o gangrene ([place]) 11/11/2017   Cigarette smoker 11/11/2017   Diabetic peripheral neuropathy ([place]) 11/11/2017   Diabetic foot infection ([place]) 11/10/2017   Post op infection    Delirium 12/07/2014   Cervical vertebral fusion 12/07/2014   Hallucination 12/07/2014   Mood disorder ([place]) 12/07/2014   Pyrexia    Neck swelling    Past Medical History:  Diagnosis Date   Diabetes mellitus without complication ([place])    Hypertension     History reviewed. No pertinent family history.  Past Surgical History:  Procedure Laterality Date   ABDOMINAL AORTOGRAM W/LOWER EXTREMITY N/A 08/11/2021   Procedure: ABDOMINAL AORTOGRAM W/LOWER EXTREMITY;  Surgeon: Cherre Robins, MD;  Location: [facility];  Service: Cardiovascular;  Laterality: N/A;   AMPUTATION Left 11/15/2017   Procedure: LEFT FOOT 2ND RAY AMPUTATION;  Surgeon: Newt Minion, MD;  Location: [facility];  Service: Orthopedics;  Laterality: Left;   CERVICAL FUSION     IR ANGIOGRAM EXTREMITY LEFT  11/14/2017   IR ANGIOGRAM SELECTIVE EACH ADDITIONAL VESSEL  11/14/2017   IR ANGIOGRAM SELECTIVE EACH ADDITIONAL VESSEL  11/14/2017   IR TIB-PERO ART PTA MOD SED  11/14/2017   IR TIB-PERO ART UNI PTA EA ADD VESSEL MOD SED  11/14/2017   IR US GUIDE VASC ACCESS LEFT  11/14/2017   PERIPHERAL VASCULAR INTERVENTION Right 08/11/2021   Procedure: PERIPHERAL VASCULAR INTERVENTION;  Surgeon: Cherre Robins, MD;  Location: [facility];  Service: Cardiovascular;  Laterality: Right;  rt sfa stent   Social History   Occupational History   Not on file  Tobacco Use   Smoking status: Every Day   Smokeless tobacco: Never  Substance and Sexual Activity    Alcohol use: No   Drug use: No   Sexual activity: Not on file

## 2021-08-24 ENCOUNTER — Other Ambulatory Visit: Payer: Self-pay

## 2021-08-24 ENCOUNTER — Encounter (HOSPITAL_COMMUNITY): Payer: Self-pay | Admitting: Orthopedic Surgery

## 2021-08-24 NOTE — Progress Notes (Signed)
Mr. Cody Pena denies chest pain or shortness of breath. Patient denies having any s/s of Covid in his household.  Patient denies any known exposure to Covid.   PCP is Dr Aida Puffer.  Mr. Cody Pena has type II diabetes. Last A1C was 12.5.  Patient states that CBG;s run under 200. I instructed Mr. Cody Pena to take 12 units of Lantus Insulin at hs and to not take Metformin in am. I instructed patient to check CBG after awaking and every 2 hours until arrival  to the hospital.  I Instructed patient if CBG is less than 70 to take 4 Glucose Tablets or 1 tube of Glucose Gel or 1/2 cup of a clear juice. Recheck CBG in 15 minutes if CBG is not over 70 call, pre- op desk at [phone] for further instructions.  I instructed Mr. Cody Pena to shower with antibiotic soap, if it is available.  Dry off with a clean towel. Do not put lotion, powder, cologne or deodorant or makeup.No jewelry or piercings. Men may shave their face and neck. Woman should not shave. No nail polish, artificial or acrylic nails. Wear clean clothes, brush your teeth. Glasses, contact lens,dentures or partials may not be worn in the OR. If you need to wear them, please bring a case for glasses, do not wear contacts or bring a case, the hospital does not have contact cases, dentures or partials will have to be removed , make sure they are clean, we will provide a denture cup to put them in. You will need some one to drive you home and a responsible person over the age of 42 to stay with you for the first 24 hours after surgery.

## 2021-08-25 ENCOUNTER — Ambulatory Visit (HOSPITAL_COMMUNITY): Payer: 59 | Admitting: Anesthesiology

## 2021-08-25 ENCOUNTER — Observation Stay (HOSPITAL_COMMUNITY)
Admission: RE | Admit: 2021-08-25 | Discharge: 2021-08-26 | Disposition: A | Discharge status: Home / Self Care | Payer: 59 | Source: Ambulatory Visit | Attending: Orthopedic Surgery | Admitting: Orthopedic Surgery

## 2021-08-25 ENCOUNTER — Encounter (HOSPITAL_COMMUNITY)
Admission: RE | Disposition: A | Discharge status: Home / Self Care | Payer: Self-pay | Source: Ambulatory Visit | Attending: Orthopedic Surgery

## 2021-08-25 DIAGNOSIS — I10 Essential (primary) hypertension: Secondary | ICD-10-CM | POA: Diagnosis not present

## 2021-08-25 DIAGNOSIS — M86171 Other acute osteomyelitis, right ankle and foot: Secondary | ICD-10-CM | POA: Diagnosis present

## 2021-08-25 DIAGNOSIS — Z89431 Acquired absence of right foot: Secondary | ICD-10-CM | POA: Diagnosis present

## 2021-08-25 DIAGNOSIS — Z79899 Other long term (current) drug therapy: Secondary | ICD-10-CM | POA: Diagnosis not present

## 2021-08-25 DIAGNOSIS — Z20822 Contact with and (suspected) exposure to covid-19: Secondary | ICD-10-CM | POA: Diagnosis not present

## 2021-08-25 DIAGNOSIS — I739 Peripheral vascular disease, unspecified: Secondary | ICD-10-CM | POA: Insufficient documentation

## 2021-08-25 DIAGNOSIS — Z794 Long term (current) use of insulin: Secondary | ICD-10-CM | POA: Diagnosis not present

## 2021-08-25 DIAGNOSIS — M86271 Subacute osteomyelitis, right ankle and foot: Secondary | ICD-10-CM

## 2021-08-25 DIAGNOSIS — E119 Type 2 diabetes mellitus without complications: Secondary | ICD-10-CM | POA: Diagnosis not present

## 2021-08-25 DIAGNOSIS — Z87891 Personal history of nicotine dependence: Secondary | ICD-10-CM | POA: Insufficient documentation

## 2021-08-25 DIAGNOSIS — Z7984 Long term (current) use of oral hypoglycemic drugs: Secondary | ICD-10-CM | POA: Diagnosis not present

## 2021-08-25 HISTORY — PX: AMPUTATION: SHX166

## 2021-08-25 HISTORY — DX: Peripheral vascular disease, unspecified: I73.9

## 2021-08-25 LAB — GLUCOSE, CAPILLARY
Glucose-Capillary: 177 mg/dL — ABNORMAL HIGH (ref 70–99)
Glucose-Capillary: 166 mg/dL — ABNORMAL HIGH (ref 70–99)
Glucose-Capillary: 151 mg/dL — ABNORMAL HIGH (ref 70–99)
Glucose-Capillary: 103 mg/dL — ABNORMAL HIGH (ref 70–99)

## 2021-08-25 LAB — CBC
HCT: 42.3 % (ref 39.0–52.0)
Hemoglobin: 14 g/dL (ref 13.0–17.0)
MCH: 32 pg (ref 26.0–34.0)
MCHC: 33.1 g/dL (ref 30.0–36.0)
MCV: 96.8 fL (ref 80.0–100.0)
Platelets: 218 10*3/uL (ref 150–400)
RBC: 4.37 MIL/uL (ref 4.22–5.81)
RDW: 12.1 % (ref 11.5–15.5)
WBC: 11.8 10*3/uL — ABNORMAL HIGH (ref 4.0–10.5)
nRBC: 0 % (ref 0.0–0.2)

## 2021-08-25 LAB — BASIC METABOLIC PANEL
Anion gap: 7 (ref 5–15)
BUN: 7 mg/dL — ABNORMAL LOW (ref 8–23)
CO2: 28 mmol/L (ref 22–32)
Calcium: 8.4 mg/dL — ABNORMAL LOW (ref 8.9–10.3)
Chloride: 98 mmol/L (ref 98–111)
Creatinine, Ser: 0.77 mg/dL (ref 0.61–1.24)
GFR, Estimated: >60 mL/min (ref >60)
Glucose, Bld: 180 mg/dL — ABNORMAL HIGH (ref 70–99)
Potassium: 4.3 mmol/L (ref 3.5–5.1)
Sodium: 133 mmol/L — ABNORMAL LOW (ref 135–145)

## 2021-08-25 LAB — SARS CORONAVIRUS 2 BY RT PCR (HOSPITAL ORDER, PERFORMED IN ~~LOC~~ HOSPITAL LAB): SARS Coronavirus 2: NEGATIVE

## 2021-08-25 SURGERY — AMPUTATION, FOOT, RAY
Anesthesia: General | Site: Foot | Laterality: Right

## 2021-08-25 MED ORDER — OXYCODONE-ACETAMINOPHEN 10-325 MG PO TABS: 1.0000 | ORAL_TABLET | ORAL | 0 refills | Status: DC | PRN

## 2021-08-25 MED ORDER — TRANEXAMIC ACID-NACL 1000-0.7 MG/100ML-% IV SOLN
1000.0000 mg | INTRAVENOUS | Status: AC
  Administered 2021-08-25: 1000 mg via INTRAVENOUS
  Filled 2021-08-25: qty 100

## 2021-08-25 MED ORDER — ONDANSETRON HCL 4 MG/2ML IJ SOLN
INTRAMUSCULAR | Status: AC
  Filled 2021-08-25: qty 2

## 2021-08-25 MED ORDER — MIDAZOLAM HCL 2 MG/2ML IJ SOLN
INTRAMUSCULAR | Status: AC
  Filled 2021-08-25: qty 2

## 2021-08-25 MED ORDER — FENTANYL CITRATE (PF) 100 MCG/2ML IJ SOLN
INTRAMUSCULAR | Status: AC
  Filled 2021-08-25: qty 2

## 2021-08-25 MED ORDER — POLYETHYLENE GLYCOL 3350 17 G PO PACK: 17.0000 g | PACK | Freq: Every day | ORAL | Status: DC | PRN

## 2021-08-25 MED ORDER — ONDANSETRON HCL 4 MG/2ML IJ SOLN
INTRAMUSCULAR | Status: DC | PRN
Start: 2021-08-25 — End: 2021-08-25
  Administered 2021-08-25: 4 mg via INTRAVENOUS

## 2021-08-25 MED ORDER — HYDROMORPHONE HCL 1 MG/ML IJ SOLN
INTRAMUSCULAR | Status: AC
  Filled 2021-08-25: qty 1

## 2021-08-25 MED ORDER — OXYCODONE HCL 5 MG/5ML PO SOLN: 5.0000 mg | Freq: Once | ORAL | Status: DC | PRN

## 2021-08-25 MED ORDER — OXYCODONE HCL 5 MG PO TABS
10.0000 mg | ORAL_TABLET | ORAL | Status: DC | PRN
  Administered 2021-08-25 – 2021-08-26 (×3): 15 mg via ORAL
  Filled 2021-08-25 (×3): qty 3

## 2021-08-25 MED ORDER — CLONAZEPAM 0.5 MG PO TABS
0.5000 mg | ORAL_TABLET | Freq: Every day | ORAL | Status: DC
  Administered 2021-08-25 – 2021-08-26 (×2): 0.5 mg via ORAL
  Filled 2021-08-25 (×2): qty 1

## 2021-08-25 MED ORDER — PHENYLEPHRINE 40 MCG/ML (10ML) SYRINGE FOR IV PUSH (FOR BLOOD PRESSURE SUPPORT)
PREFILLED_SYRINGE | INTRAVENOUS | Status: DC | PRN
  Administered 2021-08-25 (×2): 120 ug via INTRAVENOUS
  Administered 2021-08-25: 160 ug via INTRAVENOUS

## 2021-08-25 MED ORDER — AMISULPRIDE (ANTIEMETIC) 5 MG/2ML IV SOLN: 10.0000 mg | Freq: Once | INTRAVENOUS | Status: DC | PRN

## 2021-08-25 MED ORDER — ORAL CARE MOUTH RINSE: 15.0000 mL | Freq: Once | OROMUCOSAL | Status: AC

## 2021-08-25 MED ORDER — PROPOFOL 10 MG/ML IV BOLUS
INTRAVENOUS | Status: AC
  Filled 2021-08-25: qty 20

## 2021-08-25 MED ORDER — EPHEDRINE SULFATE 50 MG/ML IJ SOLN
INTRAMUSCULAR | Status: DC | PRN
  Administered 2021-08-25 (×2): 10 mg via INTRAVENOUS

## 2021-08-25 MED ORDER — METOPROLOL TARTRATE 5 MG/5ML IV SOLN: 2.0000 mg | INTRAVENOUS | Status: DC | PRN

## 2021-08-25 MED ORDER — MAGNESIUM CITRATE PO SOLN
1.0000 | Freq: Once | ORAL | Status: DC | PRN
  Filled 2021-08-25: qty 296

## 2021-08-25 MED ORDER — ONDANSETRON HCL 4 MG/2ML IJ SOLN: 4.0000 mg | Freq: Four times a day (QID) | INTRAMUSCULAR | Status: DC | PRN

## 2021-08-25 MED ORDER — LACTATED RINGERS IV SOLN: INTRAVENOUS | Status: DC | PRN

## 2021-08-25 MED ORDER — FENTANYL CITRATE (PF) 250 MCG/5ML IJ SOLN
INTRAMUSCULAR | Status: AC
  Filled 2021-08-25: qty 5

## 2021-08-25 MED ORDER — ATORVASTATIN CALCIUM 80 MG PO TABS
80.0000 mg | ORAL_TABLET | Freq: Every day | ORAL | Status: DC
  Administered 2021-08-25 – 2021-08-26 (×2): 80 mg via ORAL
  Filled 2021-08-25 (×2): qty 1

## 2021-08-25 MED ORDER — LABETALOL HCL 5 MG/ML IV SOLN: 10.0000 mg | INTRAVENOUS | Status: DC | PRN

## 2021-08-25 MED ORDER — LIDOCAINE 2% (20 MG/ML) 5 ML SYRINGE
INTRAMUSCULAR | Status: DC | PRN
  Administered 2021-08-25: 40 mg via INTRAVENOUS

## 2021-08-25 MED ORDER — INSULIN GLARGINE-YFGN 100 UNIT/ML ~~LOC~~ SOLN
15.0000 [IU] | Freq: Every day | SUBCUTANEOUS | Status: DC
  Administered 2021-08-25: 15 [IU] via SUBCUTANEOUS
  Filled 2021-08-25 (×3): qty 0.15

## 2021-08-25 MED ORDER — ASPIRIN EC 81 MG PO TBEC
81.0000 mg | DELAYED_RELEASE_TABLET | Freq: Every day | ORAL | Status: DC
  Administered 2021-08-26: 81 mg via ORAL
  Filled 2021-08-25: qty 1

## 2021-08-25 MED ORDER — CITALOPRAM HYDROBROMIDE 40 MG PO TABS
40.0000 mg | ORAL_TABLET | Freq: Every day | ORAL | Status: DC
  Administered 2021-08-25 – 2021-08-26 (×2): 40 mg via ORAL
  Filled 2021-08-25 (×2): qty 1

## 2021-08-25 MED ORDER — ALUM & MAG HYDROXIDE-SIMETH 200-200-20 MG/5ML PO SUSP: 15.0000 mL | ORAL | Status: DC | PRN

## 2021-08-25 MED ORDER — PANTOPRAZOLE SODIUM 40 MG PO TBEC
40.0000 mg | DELAYED_RELEASE_TABLET | Freq: Every day | ORAL | Status: DC
  Administered 2021-08-25 – 2021-08-26 (×2): 40 mg via ORAL
  Filled 2021-08-25 (×2): qty 1

## 2021-08-25 MED ORDER — OXYCODONE HCL 5 MG PO TABS: 5.0000 mg | ORAL_TABLET | Freq: Once | ORAL | Status: DC | PRN

## 2021-08-25 MED ORDER — HYDROMORPHONE HCL 1 MG/ML IJ SOLN
0.5000 mg | INTRAMUSCULAR | Status: DC | PRN
  Administered 2021-08-25 – 2021-08-26 (×3): 1 mg via INTRAVENOUS
  Filled 2021-08-25 (×3): qty 1

## 2021-08-25 MED ORDER — LACTATED RINGERS IV SOLN: INTRAVENOUS | Status: DC

## 2021-08-25 MED ORDER — LIDOCAINE 2% (20 MG/ML) 5 ML SYRINGE
INTRAMUSCULAR | Status: AC
  Filled 2021-08-25: qty 5

## 2021-08-25 MED ORDER — ALBUTEROL SULFATE (2.5 MG/3ML) 0.083% IN NEBU: 2.5000 mg | INHALATION_SOLUTION | Freq: Four times a day (QID) | RESPIRATORY_TRACT | Status: DC | PRN

## 2021-08-25 MED ORDER — ASCORBIC ACID 500 MG PO TABS
1000.0000 mg | ORAL_TABLET | Freq: Every day | ORAL | Status: DC
  Administered 2021-08-25 – 2021-08-26 (×2): 1000 mg via ORAL
  Filled 2021-08-25 (×2): qty 2

## 2021-08-25 MED ORDER — PROPOFOL 10 MG/ML IV BOLUS
INTRAVENOUS | Status: DC | PRN
Start: 2021-08-25 — End: 2021-08-25
  Administered 2021-08-25: 200 mg via INTRAVENOUS

## 2021-08-25 MED ORDER — CEFAZOLIN SODIUM-DEXTROSE 2-4 GM/100ML-% IV SOLN
2.0000 g | INTRAVENOUS | Status: AC
  Administered 2021-08-25: 2 g via INTRAVENOUS
  Filled 2021-08-25: qty 100

## 2021-08-25 MED ORDER — FENTANYL CITRATE (PF) 250 MCG/5ML IJ SOLN
INTRAMUSCULAR | Status: DC | PRN
  Administered 2021-08-25 (×2): 25 ug via INTRAVENOUS

## 2021-08-25 MED ORDER — CLOPIDOGREL BISULFATE 75 MG PO TABS
75.0000 mg | ORAL_TABLET | Freq: Every day | ORAL | Status: DC
  Administered 2021-08-25 – 2021-08-26 (×2): 75 mg via ORAL
  Filled 2021-08-25 (×2): qty 1

## 2021-08-25 MED ORDER — ZINC SULFATE 220 (50 ZN) MG PO CAPS
220.0000 mg | ORAL_CAPSULE | Freq: Every day | ORAL | Status: DC
  Administered 2021-08-25 – 2021-08-26 (×2): 220 mg via ORAL
  Filled 2021-08-25 (×2): qty 1

## 2021-08-25 MED ORDER — GUAIFENESIN-DM 100-10 MG/5ML PO SYRP: 15.0000 mL | ORAL_SOLUTION | ORAL | Status: DC | PRN

## 2021-08-25 MED ORDER — MAGNESIUM SULFATE 2 GM/50ML IV SOLN
2.0000 g | Freq: Every day | INTRAVENOUS | Status: DC | PRN
  Filled 2021-08-25: qty 50

## 2021-08-25 MED ORDER — OXYCODONE HCL 5 MG PO TABS: 5.0000 mg | ORAL_TABLET | ORAL | Status: DC | PRN

## 2021-08-25 MED ORDER — PHENYLEPHRINE 40 MCG/ML (10ML) SYRINGE FOR IV PUSH (FOR BLOOD PRESSURE SUPPORT)
PREFILLED_SYRINGE | INTRAVENOUS | Status: AC
  Filled 2021-08-25: qty 10

## 2021-08-25 MED ORDER — ACETAMINOPHEN 325 MG PO TABS: 325.0000 mg | ORAL_TABLET | Freq: Four times a day (QID) | ORAL | Status: DC | PRN

## 2021-08-25 MED ORDER — METFORMIN HCL 500 MG PO TABS
500.0000 mg | ORAL_TABLET | Freq: Two times a day (BID) | ORAL | Status: DC
  Administered 2021-08-25 – 2021-08-26 (×2): 500 mg via ORAL
  Filled 2021-08-25 (×2): qty 1

## 2021-08-25 MED ORDER — INSULIN ASPART 100 UNIT/ML IJ SOLN
4.0000 [IU] | Freq: Three times a day (TID) | INTRAMUSCULAR | Status: DC
  Administered 2021-08-25 – 2021-08-26 (×3): 4 [IU] via SUBCUTANEOUS

## 2021-08-25 MED ORDER — GABAPENTIN 600 MG PO TABS
600.0000 mg | ORAL_TABLET | Freq: Three times a day (TID) | ORAL | Status: DC
  Administered 2021-08-25 – 2021-08-26 (×3): 600 mg via ORAL
  Filled 2021-08-25 (×3): qty 1

## 2021-08-25 MED ORDER — PROMETHAZINE HCL 25 MG/ML IJ SOLN: 6.2500 mg | INTRAMUSCULAR | Status: DC | PRN

## 2021-08-25 MED ORDER — INSULIN ASPART 100 UNIT/ML IJ SOLN
0.0000 [IU] | Freq: Three times a day (TID) | INTRAMUSCULAR | Status: DC
  Administered 2021-08-25 – 2021-08-26 (×3): 3 [IU] via SUBCUTANEOUS

## 2021-08-25 MED ORDER — HYDROMORPHONE HCL 1 MG/ML IJ SOLN
0.2500 mg | INTRAMUSCULAR | Status: DC | PRN
  Administered 2021-08-25 (×2): 0.5 mg via INTRAVENOUS

## 2021-08-25 MED ORDER — PHENOL 1.4 % MT LIQD: 1.0000 | OROMUCOSAL | Status: DC | PRN

## 2021-08-25 MED ORDER — CHLORHEXIDINE GLUCONATE 0.12 % MT SOLN
15.0000 mL | Freq: Once | OROMUCOSAL | Status: AC
  Administered 2021-08-25: 15 mL via OROMUCOSAL
  Filled 2021-08-25: qty 15

## 2021-08-25 MED ORDER — MIDAZOLAM HCL 2 MG/2ML IJ SOLN
INTRAMUSCULAR | Status: DC | PRN
  Administered 2021-08-25: 1 mg via INTRAVENOUS

## 2021-08-25 MED ORDER — DOCUSATE SODIUM 100 MG PO CAPS
100.0000 mg | ORAL_CAPSULE | Freq: Every day | ORAL | Status: DC
  Administered 2021-08-26: 100 mg via ORAL
  Filled 2021-08-25: qty 1

## 2021-08-25 MED ORDER — ALBUTEROL SULFATE HFA 108 (90 BASE) MCG/ACT IN AERS: 2.0000 | INHALATION_SPRAY | Freq: Four times a day (QID) | RESPIRATORY_TRACT | Status: DC | PRN

## 2021-08-25 MED ORDER — 0.9 % SODIUM CHLORIDE (POUR BTL) OPTIME
TOPICAL | Status: DC | PRN
  Administered 2021-08-25: 1000 mL

## 2021-08-25 MED ORDER — JUVEN PO PACK
1.0000 | PACK | Freq: Two times a day (BID) | ORAL | Status: DC
  Administered 2021-08-25 – 2021-08-26 (×2): 1 via ORAL
  Filled 2021-08-25 (×2): qty 1

## 2021-08-25 MED ORDER — BISACODYL 5 MG PO TBEC: 5.0000 mg | DELAYED_RELEASE_TABLET | Freq: Every day | ORAL | Status: DC | PRN

## 2021-08-25 MED ORDER — HYDRALAZINE HCL 20 MG/ML IJ SOLN: 5.0000 mg | INTRAMUSCULAR | Status: DC | PRN

## 2021-08-25 MED ORDER — SODIUM CHLORIDE 0.9 % IV SOLN: INTRAVENOUS | Status: DC

## 2021-08-25 MED ORDER — POTASSIUM CHLORIDE CRYS ER 20 MEQ PO TBCR: 20.0000 meq | EXTENDED_RELEASE_TABLET | Freq: Every day | ORAL | Status: DC | PRN

## 2021-08-25 MED ORDER — CEFAZOLIN SODIUM-DEXTROSE 2-4 GM/100ML-% IV SOLN
2.0000 g | Freq: Three times a day (TID) | INTRAVENOUS | Status: AC
  Administered 2021-08-25 – 2021-08-26 (×2): 2 g via INTRAVENOUS
  Filled 2021-08-25 (×3): qty 100

## 2021-08-25 SURGICAL SUPPLY — 32 items
BAG COUNTER SPONGE SURGICOUNT (BAG) ×2 IMPLANT
BLADE SAW SGTL MED 73X18.5 STR (BLADE) IMPLANT
BLADE SURG 21 STRL SS (BLADE) ×2 IMPLANT
BNDG COHESIVE 4X5 TAN STRL (GAUZE/BANDAGES/DRESSINGS) ×2 IMPLANT
BNDG COHESIVE 6X5 TAN NS LF (GAUZE/BANDAGES/DRESSINGS) ×1 IMPLANT
BNDG GAUZE ELAST 4 BULKY (GAUZE/BANDAGES/DRESSINGS) ×2 IMPLANT
COVER SURGICAL LIGHT HANDLE (MISCELLANEOUS) ×3 IMPLANT
DRAPE DERMATAC (DRAPES) ×2 IMPLANT
DRAPE U-SHAPE 47X51 STRL (DRAPES) ×4 IMPLANT
DRESSING PEEL AND PLC PRVNA 13 (GAUZE/BANDAGES/DRESSINGS) IMPLANT
DRSG ADAPTIC 3X8 NADH LF (GAUZE/BANDAGES/DRESSINGS) ×2 IMPLANT
DRSG PAD ABDOMINAL 8X10 ST (GAUZE/BANDAGES/DRESSINGS) ×2 IMPLANT
DRSG PEEL AND PLACE PREVENA 13 (GAUZE/BANDAGES/DRESSINGS) ×2
DURAPREP 26ML APPLICATOR (WOUND CARE) ×2 IMPLANT
ELECT REM PT RETURN 9FT ADLT (ELECTROSURGICAL) ×2
ELECTRODE REM PT RTRN 9FT ADLT (ELECTROSURGICAL) ×1 IMPLANT
GAUZE SPONGE 4X4 12PLY STRL (GAUZE/BANDAGES/DRESSINGS) ×2 IMPLANT
GLOVE SURG ORTHO LTX SZ9 (GLOVE) ×2 IMPLANT
GLOVE SURG UNDER POLY LF SZ9 (GLOVE) ×2 IMPLANT
GOWN STRL REUS W/ TWL XL LVL3 (GOWN DISPOSABLE) ×2 IMPLANT
GOWN STRL REUS W/TWL XL LVL3 (GOWN DISPOSABLE) ×2
KIT BASIN OR (CUSTOM PROCEDURE TRAY) ×2 IMPLANT
KIT DRSG PREVENA PLUS 7DAY 125 (MISCELLANEOUS) ×1 IMPLANT
KIT TURNOVER KIT B (KITS) ×2 IMPLANT
NS IRRIG 1000ML POUR BTL (IV SOLUTION) ×2 IMPLANT
PACK ORTHO EXTREMITY (CUSTOM PROCEDURE TRAY) ×2 IMPLANT
PAD ARMBOARD 7.5X6 YLW CONV (MISCELLANEOUS) ×4 IMPLANT
STOCKINETTE IMPERVIOUS LG (DRAPES) IMPLANT
SUT ETHILON 2 0 PSLX (SUTURE) ×2 IMPLANT
TOWEL GREEN STERILE (TOWEL DISPOSABLE) ×2 IMPLANT
TUBE CONNECTING 12X1/4 (SUCTIONS) ×2 IMPLANT
YANKAUER SUCT BULB TIP NO VENT (SUCTIONS) ×2 IMPLANT

## 2021-08-25 NOTE — H&P (Signed)
Cody Pena is an 61 y.o. male.   Chief Complaint: Ulceration and abscess right forefoot. HPI: Patient is a 61 year old gentleman with a chronic ulcer beneath the right forefoot.  Patient states he feels about the same still has drainage with odor is still on Augmentin.  Past Medical History:  Diagnosis Date   Diabetes mellitus without complication ([place])    Hypertension    Peripheral vascular disease ([place])     Past Surgical History:  Procedure Laterality Date   ABDOMINAL AORTOGRAM W/LOWER EXTREMITY N/A 08/11/2021   Procedure: ABDOMINAL AORTOGRAM W/LOWER EXTREMITY;  Surgeon: Cherre Robins, MD;  Location: [facility];  Service: Cardiovascular;  Laterality: N/A;   AMPUTATION Left 11/15/2017   Procedure: LEFT FOOT 2ND RAY AMPUTATION;  Surgeon: Newt Minion, MD;  Location: [facility];  Service: Orthopedics;  Laterality: Left;   CERVICAL FUSION     IR ANGIOGRAM EXTREMITY LEFT  11/14/2017   IR ANGIOGRAM SELECTIVE EACH ADDITIONAL VESSEL  11/14/2017   IR ANGIOGRAM SELECTIVE EACH ADDITIONAL VESSEL  11/14/2017   IR TIB-PERO ART PTA MOD SED  11/14/2017   IR TIB-PERO ART UNI PTA EA ADD VESSEL MOD SED  11/14/2017   IR US GUIDE VASC ACCESS LEFT  11/14/2017   PERIPHERAL VASCULAR INTERVENTION Right 08/11/2021   Procedure: PERIPHERAL VASCULAR INTERVENTION;  Surgeon: Cherre Robins, MD;  Location: [facility];  Service: Cardiovascular;  Laterality: Right;  rt sfa stent    History reviewed. No pertinent family history. Social History:  reports that he quit smoking about 2 weeks ago. His smoking use included cigarettes. He has never used smokeless tobacco. He reports that he does not drink alcohol and does not use drugs.  Allergies: No Known Allergies  Medications Prior to Admission  Medication Sig Dispense Refill   albuterol (VENTOLIN HFA) 108 (90 Base) MCG/ACT inhaler Inhale 2 puffs into the lungs every 6 (six) hours as needed for shortness of breath.     amoxicillin-clavulanate (AUGMENTIN)  875-125 MG tablet Take 1 tablet by mouth 2 (two) times daily. 30 tablet 0   aspirin 81 MG EC tablet Take 1 tablet (81 mg total) by mouth daily. Swallow whole. 150 tablet 2   atorvastatin (LIPITOR) 80 MG tablet Take 1 tablet (80 mg total) by mouth daily. 30 tablet 3   citalopram (CELEXA) 40 MG tablet Take 1 tablet (40 mg total) by mouth daily. 30 tablet 3   clonazePAM (KLONOPIN) 0.5 MG tablet Take 0.5 mg by mouth daily.     clopidogrel (PLAVIX) 75 MG tablet Take 1 tablet (75 mg total) by mouth daily. 30 tablet 3   gabapentin (NEURONTIN) 600 MG tablet Take 1 tablet (600 mg total) by mouth 3 (three) times daily. 90 tablet 3   ibuprofen (ADVIL) 200 MG tablet Take 800 mg by mouth every 6 (six) hours as needed for moderate pain or headache.     insulin glargine (LANTUS SOLOSTAR) 100 UNIT/ML Solostar Pen Inject 24 Units into the skin at bedtime.     insulin lispro (HUMALOG) 100 UNIT/ML KwikPen use 3 times daily with meals BG<70-120 0unit; BG121-150 2unit; BG151-200 3unit; BG 201-250 5unit; BG251-300 8unit; BG301-350 11unit; BG351-400 15 units BG>400call MD 15 mL 11   loperamide (IMODIUM) 2 MG capsule Take 1 capsule (2 mg total) by mouth as needed for diarrhea or loose stools. 15 capsule 0   metFORMIN (GLUCOPHAGE) 500 MG tablet Take 1 tablet (500 mg total) by mouth 2 (two) times daily with a meal. 60  tablet 11   Multiple Vitamin (MULTIVITAMIN WITH MINERALS) TABS tablet Take 1 tablet by mouth daily. 30 tablet 0   nicotine (NICODERM CQ - DOSED IN MG/24 HOURS) 21 mg/24hr patch Place 1 patch (21 mg total) onto the skin daily. 28 patch 0   oxyCODONE-acetaminophen (PERCOCET) 10-325 MG tablet Take 1 tablet by mouth every 8 (eight) hours as needed for pain. May take 1 tablet (Patient taking differently: Take 1 tablet by mouth every 4 (four) hours as needed for pain.) 20 tablet 0   Vitamin D, Ergocalciferol, (DRISDOL) 1.25 MG (50000 UNIT) CAPS capsule Take 1 capsule (50,000 Units total) by mouth every 7 (seven) days.  5 capsule 0   Accu-Chek Softclix Lancets lancets Use up to four times daily as directed. 100 each 0   Blood Glucose Monitoring Suppl (BLOOD GLUCOSE MONITOR SYSTEM) w/Device KIT Use up to four times daily as directed. 1 kit 0   glucose blood test strip Use as directed up to four times daily. 100 each 0   Insulin Glargine (BASAGLAR KWIKPEN) 100 UNIT/ML Inject 24 Units into the skin at bedtime. (Patient not taking: Reported on 08/23/2021) 9 mL 2   Insulin Pen Needle 32G X 4 MM MISC Use as directed with insulin pens. 200 each 0    Results for orders placed or performed during the hospital encounter of 08/25/21 (from the past 48 hour(s))  Glucose, capillary     Status: Abnormal   Collection Time: 08/25/21  6:28 AM  Result Value Ref Range   Glucose-Capillary 177 (H) 70 - 99 mg/dL    Comment: Glucose reference range applies only to samples taken after fasting for at least 8 hours.   No results found.  Review of Systems  All other systems reviewed and are negative.  Blood pressure (!) 143/63, pulse 84, temperature 99 F (37.2 C), temperature source Oral, resp. rate 17, height _0  (1.93 m), weight 105.7 kg, SpO2 96 %. Physical Exam  Patient is alert, oriented, no adenopathy, well-dressed, normal affect, normal respiratory effort. Examination patient has a palpable dorsalis pedis pulse he has a large 3 cm diameter ulcer beneath the first and second metatarsal heads.  This ulcer probes to bone both the second and first metatarsals.  There is no ascending cellulitis. Assessment/Plan 1. Osteomyelitis of right foot, unspecified type ([place])   2. PAD (peripheral artery disease) ([place])       Plan: With the exposed first and second metatarsal head discussed with the patient his best option would be to proceed with a transmetatarsal amputation.  With his peripheral vascular disease he does have a increased risk of wound healing complications.  Newt Minion, MD 08/25/2021, 6:58 AM

## 2021-08-25 NOTE — Anesthesia Procedure Notes (Addendum)
Procedure Name: LMA Insertion Date/Time: 08/25/2021 8:45 AM Performed by: Ayesha Rumpf, CRNA Pre-anesthesia Checklist: Patient identified, Emergency Drugs available, Suction available and Patient being monitored Patient Re-evaluated:Patient Re-evaluated prior to induction Oxygen Delivery Method: Circle System Utilized Preoxygenation: Pre-oxygenation with 100% oxygen Induction Type: IV induction Ventilation: Mask ventilation without difficulty LMA: LMA inserted LMA Size: 5.0 Number of attempts: 1 Airway Equipment and Method: Bite block Placement Confirmation: positive ETCO2 Tube secured with: Tape Dental Injury: Teeth and Oropharynx as per pre-operative assessment

## 2021-08-25 NOTE — Evaluation (Signed)
Physical Therapy Evaluation Patient Details Name: Cody Pena MRN: [id number] DOB: 03-25-1961 Today's Date: 08/25/2021  History of Present Illness  61 y.o. male presents to [facility] hospital on 08/25/2021 with chronic R forefoot ulcer. Pt underwent R transmetatarsal amputation on 08/25/2021. PMH includes DMII, HTN, PVD.  Clinical Impression  Pt presents to PT with deficits in gait, balance, functional mobility, endurance. Pt and PT elect to maintain NWB to LLE during this session due to lack of post-op shoe (order present in chart). Pt is able to maintain NWB during transfers and short bouts of ambulation well. PT defers stair training as pt is groggy, likely from recent pain medication. PT will follow up tomorrow to improve gait tolerance and perform stair training. PT received clarification of WB orders from Dr. Lajoyce Corners via secure chat: PWB through heel, maintain weight off toes as much as possible.     Recommendations for follow up therapy are one component of a multi-disciplinary discharge planning process, led by the attending physician.  Recommendations may be updated based on patient status, additional functional criteria and insurance authorization.  Follow Up Recommendations No PT follow up    Assistance Recommended at Discharge PRN  Patient can return home with the following  Help with stairs or ramp for entrance;A little help with walking and/or transfers    Equipment Recommendations Rolling walker (2 wheels)  Recommendations for Other Services       Functional Status Assessment Patient has had a recent decline in their functional status and demonstrates the ability to make significant improvements in function in a reasonable and predictable amount of time.     Precautions / Restrictions Precautions Precautions: Fall Required Braces or Orthoses: Other Brace (post-op shoe not delivered at this point, order in chart) Restrictions Weight Bearing Restrictions: Yes LLE Weight Bearing:   (conflicting orders in chart. TDWB in op note, PWB in orders) Other Position/Activity Restrictions: pt maintained NWB during mobility this session due to lack of post-op shoe      Mobility  Bed Mobility Overal bed mobility: Modified Independent                  Transfers Overall transfer level: Needs assistance Equipment used: Rolling walker (2 wheels) Transfers: Sit to/from Stand Sit to Stand: Min guard           General transfer comment: verbal cues for WB status and hand placement, assist for wound vac line management    Ambulation/Gait Ambulation/Gait assistance: Min guard Gait Distance (Feet): 30 Feet Assistive device: Rolling walker (2 wheels) Gait Pattern/deviations:  (hop-to gait) Gait velocity: reduced Gait velocity interpretation: <1.8 ft/sec, indicate of risk for recurrent falls   General Gait Details: pt with slowed hop-to gait, maintaining NWB through L foot due to lack of post-op shoe. Pt demonstrates good balance and use of RW.  Stairs            Wheelchair Mobility    Modified Rankin (Stroke Patients Only)       Balance Overall balance assessment: Needs assistance Sitting-balance support: No upper extremity supported;Feet supported Sitting balance-[name]: Good     Standing balance support: Bilateral upper extremity supported;Reliant on assistive device for balance Standing balance-[name]: Poor                               Pertinent Vitals/Pain Pain Assessment: Faces Faces Pain Scale: Hurts a little bit Pain Location: L foot Pain Descriptors /  Indicators: Grimacing Pain Intervention(s): Monitored during session    Home Living Family/patient expects to be discharged to:: Private residence Living Arrangements: Spouse/significant other;Children Available Help at Discharge: Family;Available 24 hours/day Type of Home: House Home Access: Stairs to enter Entrance Stairs-Rails: None Entrance Stairs-Number of  Steps: 4   Home Layout: One level Home Equipment: Cane - single point      Prior Function Prior Level of Function : Independent/Modified Independent                     Hand Dominance        Extremity/Trunk Assessment   Upper Extremity Assessment Upper Extremity Assessment: Overall WFL for tasks assessed    Lower Extremity Assessment Lower Extremity Assessment: Overall WFL for tasks assessed;LLE deficits/detail LLE Deficits / Details: L foot wound vac intact    Cervical / Trunk Assessment Cervical / Trunk Assessment: Normal  Communication   Communication: No difficulties  Cognition Arousal/Alertness: Awake/alert;Suspect due to medications (groggy but awakens to conversation) Behavior During Therapy: WFL for tasks assessed/performed Overall Cognitive Status: Within Functional Limits for tasks assessed                                          General Comments General comments (skin integrity, edema, etc.): VSS on RA, wound vac intact    Exercises     Assessment/Plan    PT Assessment Patient needs continued PT services  PT Problem List Decreased activity tolerance;Decreased balance;Decreased mobility;Decreased knowledge of precautions;Pain       PT Treatment Interventions DME instruction;Gait training;Stair training;Functional mobility training;Therapeutic activities;Therapeutic exercise;Balance training;Patient/family education    PT Goals (Current goals can be found in the Care Plan section)  Acute Rehab PT Goals Patient Stated Goal: to go home PT Goal Formulation: With patient Time For Goal Achievement: 09/08/21 Potential to Achieve Goals: Good    Frequency Min 5X/week     Co-evaluation               AM-PAC PT "6 Clicks" Mobility  Outcome Measure Help needed turning from your back to your side while in a flat bed without using bedrails?: None Help needed moving from lying on your back to sitting on the side of a flat bed  without using bedrails?: None Help needed moving to and from a bed to a chair (including a wheelchair)?: A Little Help needed standing up from a chair using your arms (e.g., wheelchair or bedside chair)?: A Little Help needed to walk in hospital room?: A Little Help needed climbing 3-5 steps with a railing? : Total 6 Click Score: 18    End of Session   Activity Tolerance: Patient tolerated treatment well Patient left: in bed;with call bell/phone within reach;with bed alarm set;with family/visitor present Nurse Communication: Mobility status PT Visit Diagnosis: Other abnormalities of gait and mobility (R26.89)    Time: 5176-1607 PT Time Calculation (min) (ACUTE ONLY): 18 min   Charges:   PT Evaluation $PT Eval Low Complexity: 1 Low          Arlyss Gandy, PT, DPT Acute Rehabilitation Pager: [phone] Office [phone]   Arlyss Gandy 08/25/2021, 3:57 PM

## 2021-08-25 NOTE — Progress Notes (Signed)
Orthopedic Tech Progress Note Patient Details:  Cody Pena [birth year] [phone] Left shoe in room Ortho Devices Type of Ortho Device: Postop shoe/boot Ortho Device/Splint Location: RLE Ortho Device/Splint Interventions: Ordered   Post Interventions Patient Tolerated: Other (comment) Instructions Provided: Poper ambulation with device, Care of device, Adjustment of device  [name] 08/25/2021, 11:49 PM

## 2021-08-25 NOTE — Transfer of Care (Signed)
Immediate Anesthesia Transfer of Care Note  Patient: Cody Pena  Procedure(s) Performed: RIGHT TRANSMETATARSAL AMPUTATION (Right: Foot)  Patient Location: PACU  Anesthesia Type:General  Level of Consciousness: drowsy, patient cooperative and responds to stimulation  Airway & Oxygen Therapy: Patient Spontanous Breathing  Post-op Assessment: Report given to RN and Post -op Vital signs reviewed and stable  Post vital signs: Reviewed and stable  Last Vitals:  Vitals Value Taken Time  BP    Temp    Pulse 73 08/25/21 0919  Resp 13 08/25/21 0919  SpO2 96 % 08/25/21 0919  Vitals shown include unvalidated device data.  Last Pain:  Vitals:   08/25/21 0626  TempSrc: Oral         Complications: No notable events documented.

## 2021-08-25 NOTE — Anesthesia Preprocedure Evaluation (Signed)
Anesthesia Evaluation  Patient identified by MRN, date of birth, ID band Patient awake    Reviewed: Allergy & Precautions, NPO status , Patient's Chart, lab work & pertinent test results  History of Anesthesia Complications Negative for: history of anesthetic complications  Airway Mallampati: II  TM Distance: >3 FB Neck ROM: Full    Dental  (+) Dental Advisory Given, Poor Dentition, Missing, Loose   Pulmonary neg pulmonary ROS, Patient abstained from smoking., former smoker,    breath sounds clear to auscultation       Cardiovascular hypertension, Pt. on medications (-) angina+ Peripheral Vascular Disease   Rhythm:Regular Rate:Normal     Neuro/Psych  Neuromuscular disease negative psych ROS   GI/Hepatic negative GI ROS, Neg liver ROS,   Endo/Other  negative endocrine ROSdiabetes, Insulin Dependent  Renal/GU negative Renal ROS  negative genitourinary   Musculoskeletal  (+) Arthritis , Osteoarthritis,    Abdominal   Peds negative pediatric ROS (+)  Hematology negative hematology ROS (+)   Anesthesia Other Findings   Reproductive/Obstetrics negative OB ROS                             Anesthesia Physical  Anesthesia Plan  ASA: III  Anesthesia Plan: General   Post-op Pain Management:    Induction: Intravenous  PONV Risk Score and Plan: 2 and Ondansetron, Midazolam and Treatment may vary due to age or medical condition  Airway Management Planned: LMA  Additional Equipment:   Intra-op Plan:   Post-operative Plan: Extubation in OR  Informed Consent:     Dental advisory given  Plan Discussed with: CRNA and Surgeon  Anesthesia Plan Comments: (Plan routine monitors, GA- LMA OK)        Anesthesia Quick Evaluation

## 2021-08-25 NOTE — Op Note (Signed)
08/25/2021  9:27 AM  PATIENT:  Cody Pena    PRE-OPERATIVE DIAGNOSIS:  Osteomyelitis Right Foot  POST-OPERATIVE DIAGNOSIS:  Same  PROCEDURE:  RIGHT TRANSMETATARSAL AMPUTATION Application 13 cm Prevena wound VAC  SURGEON:  Nadara Mustard, MD  PHYSICIAN ASSISTANT:None ANESTHESIA:   General  PREOPERATIVE INDICATIONS:  [name] is a  61 y.o. male with a diagnosis of Osteomyelitis Right Foot who failed conservative measures and elected for surgical management.    The risks benefits and alternatives were discussed with the patient preoperatively including but not limited to the risks of infection, bleeding, nerve injury, cardiopulmonary complications, the need for revision surgery, among others, and the patient was willing to proceed.  OPERATIVE IMPLANTS: 13 cm Prevena wound VAC.  @ENCIMAGES @  OPERATIVE FINDINGS: Tissue margins were clear good healthy muscle and good petechial bleeding.  OPERATIVE PROCEDURE: Patient was brought the operating room underwent a general anesthetic.  After adequate levels anesthesia were obtained patient's right lower extremity was prepped using DuraPrep draped into a sterile field a timeout was called.  A fishmouth incision was made just proximal to the ulcerative tissue.  This was carried sharply down to bone.  An oscillating saw was used to perform a transmetatarsal amputation.  The plantar aspect the bone was beveled electrocardio is used hemostasis the wound was irrigated with normal saline.  The incision was closed using 2-0 nylon a 13 cm Prevena wound VAC was applied this had a good suction fit this was overwrapped with Coban.  Patient was extubated taken the PACU in stable condition.   DISCHARGE PLANNING:  Antibiotic duration: 24-hour IV antibiotics  Weightbearing: Touchdown weightbearing on the right  Pain medication: Opioid pathway  Dressing care/ Wound VAC: Continue wound VAC for 1 week  Ambulatory devices: Walker or  crutches  Discharge to: Anticipate discharge to home in 23 hours  Follow-up: In the office 1 week post operative.

## 2021-08-26 DIAGNOSIS — M86171 Other acute osteomyelitis, right ankle and foot: Secondary | ICD-10-CM | POA: Diagnosis not present

## 2021-08-26 LAB — GLUCOSE, CAPILLARY
Glucose-Capillary: 179 mg/dL — ABNORMAL HIGH (ref 70–99)
Glucose-Capillary: 194 mg/dL — ABNORMAL HIGH (ref 70–99)

## 2021-08-26 NOTE — Progress Notes (Signed)
Patient stable Pain controlled Did very well with PT yesterday Surgical dressing and prevena intact, will be left on until follow up with Dr. Lajoyce Corners in 1 week Heel weight bearing when necessary but elevate otherwise

## 2021-08-26 NOTE — Anesthesia Postprocedure Evaluation (Signed)
Anesthesia Post Note  Patient: Cody Pena  Procedure(s) Performed: RIGHT TRANSMETATARSAL AMPUTATION (Right: Foot)     Patient location during evaluation: PACU Anesthesia Type: General Level of consciousness: awake and alert Pain management: pain level controlled Vital Signs Assessment: post-procedure vital signs reviewed and stable Respiratory status: spontaneous breathing, nonlabored ventilation, respiratory function stable and patient connected to nasal cannula oxygen Cardiovascular status: blood pressure returned to baseline and stable Postop Assessment: no apparent nausea or vomiting Anesthetic complications: no   No notable events documented.  Last Vitals:  Vitals:   08/26/21 0459 08/26/21 0733  BP: 136/68 119/64  Pulse: 99 95  Resp: 17 16  Temp: 37.3 C 37.8 C  SpO2: (!) 83% 91%    Last Pain:  Vitals:   08/26/21 0733  TempSrc: Oral  PainSc:                  [name]

## 2021-08-26 NOTE — TOC Transition Note (Signed)
Transition of Care [facility]) - CM/SW Discharge Note   Patient Details  Name: Cody Pena MRN: [id number] Date of Birth: [birth year]  [facility]) CM/SW Contact:  Bess Kinds, RN Phone Number: [phone] 08/26/2021, 8:31 AM   Clinical Narrative:     Spoke with patient's daughter, Cody Pena, on hospital room phone. Discussed recommendation for RW. Patient advised that walker had already been brought to room. No further TOC needs identified at this time.   Final next level of care: Home/Self Care Barriers to Discharge: No Barriers Identified   Patient Goals and CMS Choice        Discharge Placement                       Discharge Plan and Services                                     Social Determinants of Health (SDOH) Interventions     Readmission Risk Interventions No flowsheet data found.

## 2021-08-26 NOTE — TOC Transition Note (Signed)
Transition of Care [facility]) - CM/SW Discharge Note   Patient Details  Name: Cody Pena MRN: [id number] Date of Birth: 09/25/1960  Transition of Care [facility]) CM/SW Contact:  Bess Kinds, RN Phone Number: [phone] 08/26/2021, 11:07 AM   Clinical Narrative:     Notified by nursing that patient did not have a walker in his room for transition home. Referral to [employer] for delivery to the room. No further TOC needs identified at this time.   Final next level of care: Home/Self Care Barriers to Discharge: No Barriers Identified   Patient Goals and CMS Choice        Discharge Placement                       Discharge Plan and Services                DME Arranged: Walker rolling DME Agency: [employer] Date DME Agency Contacted: 08/26/21 Time DME Agency Contacted: 1107 Representative spoke with at DME Agency: Leavy Cella            Social Determinants of Health (SDOH) Interventions     Readmission Risk Interventions No flowsheet data found.

## 2021-08-26 NOTE — Plan of Care (Signed)

## 2021-08-26 NOTE — Discharge Summary (Signed)
Patient ID: DRAE MITZEL MRN: [id number] DOB/AGE: [birth year] 61 y.o.  Admit date: 08/25/2021 Discharge date: 08/26/2021  Admission Diagnoses:  History of transmetatarsal amputation of right foot [facility])  Discharge Diagnoses:  Principal Problem:   History of transmetatarsal amputation of right foot [facility])   Past Medical History:  Diagnosis Date   Diabetes mellitus without complication ([place])    Hypertension    Peripheral vascular disease ([place])     Surgeries: Procedure(s): RIGHT TRANSMETATARSAL AMPUTATION on 08/25/2021   Consultants (if any):   Discharged Condition: Improved  Hospital Course: STEPHEN BARUCH is an 62 y.o. male who was admitted 08/25/2021 with a diagnosis of History of transmetatarsal amputation of right foot ([place]) and went to the operating room on 08/25/2021 and underwent the above named procedures.    He was given perioperative antibiotics:  Anti-infectives (From admission, onward)    Start     Dose/Rate Route Frequency Ordered Stop   08/25/21 1700  ceFAZolin (ANCEF) IVPB 2g/100 mL premix        2 g 200 mL/hr over 30 Minutes Intravenous Every 8 hours 08/25/21 1213 08/26/21 0112   08/25/21 0645  ceFAZolin (ANCEF) IVPB 2g/100 mL premix        2 g 200 mL/hr over 30 Minutes Intravenous On call to O.R. 08/25/21 6440 08/25/21 0847     .  He was given sequential compression devices, early ambulation, and appropriate chemoprophylaxis for DVT prophylaxis.  He benefited maximally from the hospital stay and there were no complications.    Recent vital signs:  Vitals:   08/26/21 0459 08/26/21 0733  BP: 136/68 119/64  Pulse: 99 95  Resp: 17 16  Temp: 99.2 F (37.3 C) 100 F (37.8 C)  SpO2: (!) 83% 91%    Recent laboratory studies:  Lab Results  Component Value Date   HGB 14.0 08/25/2021   HGB 15.0 08/11/2021   HGB 15.9 07/25/2021   Lab Results  Component Value Date   WBC 11.8 (H) 08/25/2021   PLT 218 08/25/2021   Lab Results  Component  Value Date   INR 1.05 11/10/2017   Lab Results  Component Value Date   NA 133 (L) 08/25/2021   K 4.3 08/25/2021   CL 98 08/25/2021   CO2 28 08/25/2021   BUN 7 (L) 08/25/2021   CREATININE 0.77 08/25/2021   GLUCOSE 180 (H) 08/25/2021    Discharge Medications:   Allergies as of 08/26/2021   No Known Allergies      Medication List     TAKE these medications    Accu-Chek Guide test strip Generic drug: glucose blood Use as directed up to four times daily.   Accu-Chek Guide w/Device Kit Use up to four times daily as directed.   Accu-Chek Softclix Lancets lancets Use up to four times daily as directed.   albuterol 108 (90 Base) MCG/ACT inhaler Commonly known as: VENTOLIN HFA Inhale 2 puffs into the lungs every 6 (six) hours as needed for shortness of breath.   amoxicillin-clavulanate 875-125 MG tablet Commonly known as: Augmentin Take 1 tablet by mouth 2 (two) times daily.   Aspirin Low Dose 81 MG EC tablet Generic drug: aspirin Take 1 tablet (81 mg total) by mouth daily. Swallow whole.   atorvastatin 80 MG tablet Commonly known as: LIPITOR Take 1 tablet (80 mg total) by mouth daily.   citalopram 40 MG tablet Commonly known as: CELEXA Take 1 tablet (40 mg total) by mouth daily.   clonazePAM 0.5  MG tablet Commonly known as: KLONOPIN Take 0.5 mg by mouth daily.   clopidogrel 75 MG tablet Commonly known as: PLAVIX Take 1 tablet (75 mg total) by mouth daily.   gabapentin 600 MG tablet Commonly known as: NEURONTIN Take 1 tablet (600 mg total) by mouth 3 (three) times daily.   HumaLOG KwikPen 100 UNIT/ML KwikPen Generic drug: insulin lispro use 3 times daily with meals BG<70-120 0unit; BG121-150 2unit; BG151-200 3unit; BG 201-250 5unit; BG251-300 8unit; BG301-350 11unit; BG351-400 15 units BG>400call MD   ibuprofen 200 MG tablet Commonly known as: ADVIL Take 800 mg by mouth every 6 (six) hours as needed for moderate pain or headache.   Lantus SoloStar 100  UNIT/ML Solostar Pen Generic drug: insulin glargine Inject 24 Units into the skin at bedtime.   Lantus SoloStar 100 UNIT/ML Solostar Pen Generic drug: insulin glargine Inject 24 Units into the skin at bedtime.   loperamide 2 MG capsule Commonly known as: IMODIUM Take 1 capsule (2 mg total) by mouth as needed for diarrhea or loose stools.   metFORMIN 500 MG tablet Commonly known as: Glucophage Take 1 tablet (500 mg total) by mouth 2 (two) times daily with a meal.   multivitamin with minerals Tabs tablet Take 1 tablet by mouth daily.   nicotine 21 mg/24hr patch Commonly known as: NICODERM CQ - dosed in mg/24 hours Place 1 patch (21 mg total) onto the skin daily.   oxyCODONE-acetaminophen 10-325 MG tablet Commonly known as: PERCOCET Take 1 tablet by mouth every 4 (four) hours as needed for pain.   Pentips 32G X 4 MM Misc Generic drug: Insulin Pen Needle Use as directed with insulin pens.   Vitamin D (Ergocalciferol) 1.25 MG (50000 UNIT) Caps capsule Commonly known as: DRISDOL Take 1 capsule (50,000 Units total) by mouth every 7 (seven) days.        Diagnostic Studies: PERIPHERAL VASCULAR CATHETERIZATION  Result Date: 08/11/2021 DATE OF SERVICE: 08/11/2021  PATIENT:  Jonnie Finner  61 y.o. 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  PATIENT  DISPOSITION:  PACU - hemodynamically stable.  Delay start of Pharmacological VTE agent (>24hrs) due to surgical blood loss or risk of bleeding: no  INDICATION FOR PROCEDURE: RobeJAHKARI MACLINa 60 y31. male with bilateral plantar ulceration.  His noninvasive testing suggested significant peripheral arterial disease bilaterally he underwent angioplasty and stenting of the left superficial femoral artery in 2018 with interventional radiology. After careful discussion of risks, benefits, and alternatives the patient was offered angiogram. The patient understood and wished to proceed.  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 rapidly tapers to  occlusion in the proximal thigh. Popliteal artery: Reconstitution above the knee via large collateral, patent through the knee to the confluence of the anterior tibial artery Anterior tibial artery: Chronically occluded at its origin Tibioperoneal trunk: Widely patent without flow-limiting stenosis Peroneal artery: Patent without flow-limiting stenosis, coursing to the ankle Posterior tibial artery: Dominant tibial, coursing to the foot filling the pedal circulation Pedal circulation: Robust via the posterior tibial artery  Left lower extremity: Common femoral artery: Patent without flow-limiting stenosis Profunda femoris artery: Patent without flow-limiting stenosis Superficial femoral artery: Flush occlusion at the origin.  Prior stenting is occluded.  Popliteal artery: Reconstitution above the knee, patent through  the knee to the confluence of the anterior tibial artery Anterior tibial artery: Chronically occluded at its origin Tibioperoneal trunk: Widely patent without flow-limiting stenosis Peroneal artery: Patent without flow-limiting stenosis, coursing to the ankle Posterior tibial artery: Dominant tibial, coursing to the foot filling the pedal circulation Pedal circulation: Robust via the posterior tibial artery  GLASS score. Grade 3: expect 53% 1 year re-intervention rate; expect 69% 5 year re-intervention rate; expect 49% 5 year mortality; expect 61% rate of restenosis from open operation at 5 years; expect 83% rate of restenosis from endovascular procedure.  WIfI score 2 / 1 / 0; clinical stage 3. Moderate amputation risk. Moderate potential benefit from revascularization.  DESCRIPTION OF PROCEDURE: After identification of the patient in the pre-operative holding area, the patient was transferred to the operating room. The patient was positioned supine on the operating room table. Anesthesia was induced. The groins was prepped and draped in standard fashion. A surgical pause was performed confirming correct patient, procedure, and operative location.  The left groin was anesthetized with subcutaneous injection of 1% lidocaine. Using ultrasound guidance, the left common femoral artery was accessed with micropuncture technique. Fluoroscopy was used to confirm cannulation over the femoral head. The 8F sheath was upsized to 62F.  A Benson wire was advanced into the distal aorta. Over the wire an omni flush catheter was advanced to the level of L2. Aortogram was performed - see above for details. Bilateral lower extremity runoff angiography was performed - see above for details.  The right common iliac artery was selected with a glidewire advantage guidewire. The wire was advanced into the common femoral artery. Over the wire the omni flush catheter was advanced into the external iliac artery. Selective angiography was  performed - see above for details.  The decision was made to intervene. The patient was heparinized with 10,000 units of heparin. The 62F sheath was exchanged for a 6F x 45cm sheath. Selective angiography of the left lower extremity was performed prior to intervention.  The long segment femoral-popliteal occlusion was crossed with a Glidewire advantage and quick cross catheter subintimal technique.  We were able to reenter above the knee.  Confirmatory angiography showed true lumen position distal to the occlusion  The lesions were treated with: Stenting and angioplasty.  The lesion was predilated with a 4 x 200 mm Mustang balloon.  7 x 150 mm, 7 x 150 mm and 7 x 120 mm Eluvia stents were delivered across the lesion in standard technique.  The stents were postdilated with a 6 x 200 mm Mustang balloon  Completion angiography revealed: Resolution of the occlusion and restoration of inline flow to the foot  2 Perclose devices were used to close the arteriotomy. Hemostasis was excellent upon completion.  Conscious sedation was administered with the use of IV fentanyl and midazolam under continuous physician  and nurse monitoring.  Heart rate, blood pressure, and oxygen saturation were continuously monitored.  Total sedation time was 79 minutes  Upon completion of the case instrument and sharps counts were confirmed correct. The patient was transferred to the PACU in good condition. I was present for all portions of the procedure.  PLAN: Continue dual antiplatelet therapy with aspirin 81 mg by mouth daily.  Continue Plavix 75 g by mouth daily.  Continue high intensity statin therapy.  He is cleared for any kind of surgery on the right foot.  I will bring him back to the office on Tuesday with an ankle-brachial index to review options for the left lower extremity.  He would likely benefit from a left common femoral to popliteal artery bypass given previous failed stenting.  Yevonne Aline. Stanford Breed, MD [facility] Phone Number: [phone] 08/11/2021 9:00 AM    Disposition: Discharge disposition: 01-Home or Self Care       Discharge Instructions     Call MD / Call 911   Complete by: As directed    If you experience chest pain or shortness of breath, CALL 911 and be transported to the hospital emergency room.  If you develope a fever above 101 F, pus (white drainage) or increased drainage or redness at the wound, or calf pain, call your surgeon's office.   Call MD / Call 911   Complete by: As directed    If you experience chest pain or shortness of breath, CALL 911 and be transported to the hospital emergency room.  If you develope a fever above 101.5 F, pus (white drainage) or increased drainage or redness at the wound, or calf pain, call your surgeon's office.   Constipation Prevention   Complete by: As directed    Drink plenty of fluids.  Prune juice may be helpful.  You may use a stool softener, such as Colace (over the counter) 100 mg twice a day.  Use MiraLax (over the counter) for constipation as needed.   Constipation Prevention   Complete by: As directed    Drink plenty of fluids.  Prune juice may be helpful.  You may use a stool softener, such as Colace (over the counter) 100 mg twice a day.  Use MiraLax (over the counter) for constipation as needed.   Diet - low sodium heart healthy   Complete by: As directed    Driving restrictions   Complete by: As directed    No driving while taking narcotic pain meds.   Increase activity slowly as tolerated   Complete by: As directed    Increase activity slowly as tolerated   Complete by: As directed    Negative Pressure Wound Therapy - Incisional   Complete by: As directed    Post-operative opioid taper instructions:   Complete by: As directed    POST-OPERATIVE OPIOID TAPER INSTRUCTIONS: It is important to wean off of your opioid medication as soon as possible. If you do not need pain medication after your surgery it  is ok to stop day one. Opioids include: Codeine, Hydrocodone(Norco, Vicodin), Oxycodone(Percocet, oxycontin) and hydromorphone amongst others.  Long term and even short term use of opiods can cause: Increased pain response Dependence Constipation Depression Respiratory depression And more.  Withdrawal symptoms can include Flu like symptoms Nausea, vomiting And more Techniques to manage these symptoms Hydrate well Eat regular healthy meals Stay active Use relaxation techniques(deep breathing, meditating, yoga) Do Not substitute Alcohol to help with  tapering If you have been on opioids for less than two weeks and do not have pain than it is ok to stop all together.  Plan to wean off of opioids This plan should start within one week post op of your joint replacement. Maintain the same interval or time between taking each dose and first decrease the dose.  Cut the total daily intake of opioids by one tablet each day Next start to increase the time between doses. The last dose that should be eliminated is the evening dose.      Post-operative opioid taper instructions:   Complete by: As directed    POST-OPERATIVE OPIOID TAPER INSTRUCTIONS: It is important to wean off of your opioid medication as soon as possible. If you do not need pain medication after your surgery it is ok to stop day one. Opioids include: Codeine, Hydrocodone(Norco, Vicodin), Oxycodone(Percocet, oxycontin) and hydromorphone amongst others.  Long term and even short term use of opiods can cause: Increased pain response Dependence Constipation Depression Respiratory depression And more.  Withdrawal symptoms can include Flu like symptoms Nausea, vomiting And more Techniques to manage these symptoms Hydrate well Eat regular healthy meals Stay active Use relaxation techniques(deep breathing, meditating, yoga) Do Not substitute Alcohol to help with tapering If you have been on opioids for less than two  weeks and do not have pain than it is ok to stop all together.  Plan to wean off of opioids This plan should start within one week post op of your joint replacement. Maintain the same interval or time between taking each dose and first decrease the dose.  Cut the total daily intake of opioids by one tablet each day Next start to increase the time between doses. The last dose that should be eliminated is the evening dose.           Follow-up Information     Newt Minion, MD Follow up in 1 week(s).   Specialty: Orthopedic Surgery Contact information: [address] [place] [address] [phone]                  Signed: Eduard Roux 08/26/2021, 9:44 AM

## 2021-08-27 ENCOUNTER — Encounter (HOSPITAL_COMMUNITY): Payer: Self-pay | Admitting: Orthopedic Surgery

## 2021-08-28 LAB — SURGICAL PATHOLOGY

## 2021-08-29 ENCOUNTER — Encounter (HOSPITAL_COMMUNITY): Payer: 59

## 2021-08-29 ENCOUNTER — Encounter: Payer: 59 | Admitting: Vascular Surgery

## 2021-09-01 ENCOUNTER — Encounter: Payer: Self-pay | Admitting: Family

## 2021-09-01 ENCOUNTER — Ambulatory Visit (INDEPENDENT_AMBULATORY_CARE_PROVIDER_SITE_OTHER): Payer: 59 | Admitting: Family

## 2021-09-01 DIAGNOSIS — M869 Osteomyelitis, unspecified: Secondary | ICD-10-CM

## 2021-09-01 MED ORDER — DOXYCYCLINE HYCLATE 100 MG PO TABS: 100.0000 mg | ORAL_TABLET | Freq: Two times a day (BID) | ORAL | 0 refills | Status: DC

## 2021-09-04 ENCOUNTER — Other Ambulatory Visit (HOSPITAL_COMMUNITY): Payer: Self-pay

## 2021-09-08 ENCOUNTER — Encounter: Payer: Self-pay | Admitting: Family

## 2021-09-08 ENCOUNTER — Other Ambulatory Visit: Payer: Self-pay

## 2021-09-08 ENCOUNTER — Ambulatory Visit (INDEPENDENT_AMBULATORY_CARE_PROVIDER_SITE_OTHER): Payer: 59 | Admitting: Family

## 2021-09-08 DIAGNOSIS — Z89431 Acquired absence of right foot: Secondary | ICD-10-CM

## 2021-09-08 NOTE — Progress Notes (Signed)
Post-Op Visit Note   Patient: Cody Pena           Date of Birth: 1961/03/21           MRN: [id number] Visit Date: 09/08/2021 PCP: Aida Puffer, MD  Chief Complaint:  Chief Complaint  Patient presents with   Right Foot - Routine Post Op    08/25/2021 right transmet amputation     HPI:  HPI The patient is a 61 year old gentleman who presents 2-week status post right transmetatarsal amputation.  He has been on doxycycline for the last week is pleased with improvement in the redness of his foot  Ortho Exam On examination of his transmetatarsal amputation incision this is gaped a couple millimeters wide there is no active drainage no odor.  The erythema is much improved  He has been having serosanguineous drainage  Visit Diagnoses:  1. History of transmetatarsal amputation of right foot (HCC)     Plan: He will continue on his doxycycline.  Minimize weightbearing.  Daily Dial soap cleansing dry dressings follow-up in the office in 10 to 14 days  Follow-Up Instructions: No follow-ups on file.   Imaging: No results found.  Orders:  No orders of the defined types were placed in this encounter.  No orders of the defined types were placed in this encounter.    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) 01/14/2018   Ulcer of left foot, limited to breakdown of skin (HCC) 01/14/2018   Foot osteomyelitis, left (HCC)    Septic arthritis of foot (HCC) 11/11/2017   Osteomyelitis of ankle or foot, acute, left (HCC) 11/11/2017   Type 2 DM with diabetic peripheral angiopathy w/o gangrene (HCC) 11/11/2017   Cigarette smoker 11/11/2017   Diabetic peripheral neuropathy (HCC)  11/11/2017   Diabetic foot infection (HCC) 11/10/2017   Post op infection    Delirium 12/07/2014   Cervical vertebral fusion 12/07/2014   Hallucination 12/07/2014   Mood disorder (HCC) 12/07/2014   Pyrexia    Neck swelling    Past Medical History:  Diagnosis Date   Diabetes mellitus without complication (HCC)    Hypertension    Peripheral vascular disease (HCC)     History reviewed. No pertinent family history.  Past Surgical History:  Procedure Laterality Date   ABDOMINAL AORTOGRAM W/LOWER EXTREMITY N/A 08/11/2021   Procedure: ABDOMINAL AORTOGRAM W/LOWER EXTREMITY;  Surgeon: Leonie Douglas, MD;  [facility];  Service: Cardiovascular;  Laterality: N/A;   AMPUTATION Left 11/15/2017   Procedure: LEFT FOOT 2ND RAY AMPUTATION;  Surgeon: Nadara Mustard, MD;  Location: [facility] OR;  Service: Orthopedics;  Laterality: Left;   AMPUTATION Right 08/25/2021   Procedure: RIGHT TRANSMETATARSAL AMPUTATION;  Surgeon: Nadara Mustard, MD;  Location: [facility] OR;  Service: Orthopedics;  Laterality: Right;   CERVICAL FUSION     IR ANGIOGRAM EXTREMITY LEFT  11/14/2017   IR ANGIOGRAM SELECTIVE EACH ADDITIONAL VESSEL  11/14/2017   IR ANGIOGRAM SELECTIVE EACH ADDITIONAL VESSEL  11/14/2017   IR TIB-PERO ART PTA MOD SED  11/14/2017   IR TIB-PERO ART UNI PTA EA ADD VESSEL MOD SED  11/14/2017   IR US GUIDE VASC ACCESS LEFT  11/14/2017   PERIPHERAL  VASCULAR INTERVENTION Right 08/11/2021   Procedure: PERIPHERAL VASCULAR INTERVENTION;  Surgeon: Leonie Douglas, MD;  [facility];  Service: Cardiovascular;  Laterality: Right;  rt sfa stent   Social History   Occupational History   Not on file  Tobacco Use   Smoking status: Former    Years: 47.00    Types: Cigarettes    Quit date: 08/11/2021    Years since quitting: 0.0   Smokeless tobacco: Never  Vaping Use   Vaping Use: Some days  Substance and Sexual Activity   Alcohol use: No   Drug use: No   Sexual activity: Not on file

## 2021-09-08 NOTE — Progress Notes (Addendum)
Post-Op Visit Note   Patient: Cody Pena           Date of Birth: [birth year]           MRN: [id number] Visit Date: 09/01/2021 PCP: Aida Puffer, MD  Chief Complaint:  Chief Complaint  Patient presents with   Right Foot - Routine Post Op    08/25/2021 right transmet amputation     HPI:  HPI The patient is a 61 year old gentleman who presents 1 week status post right transmetatarsal amputation Ortho Exam Incision well approximated with sutures there is no gaping drainage. There is surrounding erythema, no ascending cellulitis Visit Diagnoses: No diagnosis found.  Plan: placed on doxycycline. Begin daily Dial soap cleansing.  Dry dressing changes.  Minimize weightbearing.  Follow-Up Instructions: No follow-ups on file.   Imaging: No results found.  Orders:  No orders of the defined types were placed in this encounter.  Meds ordered this encounter  Medications   doxycycline (VIBRA-TABS) 100 MG tablet    Sig: Take 1 tablet (100 mg total) by mouth 2 (two) times daily.    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) 01/14/2018   Ulcer of left foot, limited to breakdown of skin (HCC) 01/14/2018   Foot osteomyelitis, left (HCC)    Septic arthritis of foot (HCC) 11/11/2017   Osteomyelitis of ankle or foot, acute, left (HCC) 11/11/2017   Type 2 DM with diabetic peripheral angiopathy w/o gangrene (HCC) 11/11/2017   Cigarette smoker 11/11/2017   Diabetic peripheral neuropathy (HCC) 11/11/2017   Diabetic foot infection (HCC) 11/10/2017   Post op infection    Delirium 12/07/2014   Cervical vertebral fusion 12/07/2014   Hallucination  12/07/2014   Mood disorder (HCC) 12/07/2014   Pyrexia    Neck swelling    Past Medical History:  Diagnosis Date   Diabetes mellitus without complication (HCC)    Hypertension    Peripheral vascular disease (HCC)     History reviewed. No pertinent family history.  Past Surgical History:  Procedure Laterality Date   ABDOMINAL AORTOGRAM W/LOWER EXTREMITY N/A 08/11/2021   Procedure: ABDOMINAL AORTOGRAM W/LOWER EXTREMITY;  Surgeon: Leonie Douglas, MD;  [facility];  Service: Cardiovascular;  Laterality: N/A;   AMPUTATION Left 11/15/2017   Procedure: LEFT FOOT 2ND RAY AMPUTATION;  Surgeon: Nadara Mustard, MD;  Location: [facility] OR;  Service: Orthopedics;  Laterality: Left;   AMPUTATION Right 08/25/2021   Procedure: RIGHT TRANSMETATARSAL AMPUTATION;  Surgeon: Nadara Mustard, MD;  Location: [facility] OR;  Service: Orthopedics;  Laterality: Right;   CERVICAL FUSION     IR ANGIOGRAM EXTREMITY LEFT  11/14/2017   IR ANGIOGRAM SELECTIVE EACH ADDITIONAL VESSEL  11/14/2017   IR ANGIOGRAM SELECTIVE EACH ADDITIONAL VESSEL  11/14/2017   IR TIB-PERO ART PTA MOD SED  11/14/2017   IR TIB-PERO ART UNI PTA EA ADD VESSEL MOD SED  11/14/2017   IR US GUIDE VASC ACCESS LEFT  11/14/2017   PERIPHERAL VASCULAR INTERVENTION Right 08/11/2021   Procedure: PERIPHERAL VASCULAR INTERVENTION;  Surgeon: Leonie Douglas, MD;  [facility];  Service: Cardiovascular;  Laterality: Right;  rt sfa stent   Social History   Occupational History   Not on file  Tobacco Use   Smoking status: Former    Years: 47.00    Types: Cigarettes    Quit date: 08/11/2021    Years since quitting: 0.0   Smokeless tobacco: Never  Vaping Use   Vaping Use: Some days  Substance and Sexual Activity   Alcohol use: No   Drug use: No   Sexual activity: Not on file

## 2021-09-11 NOTE — Progress Notes (Deleted)
[facility] [place]  ASSESSMENT / PLAN: Cody Pena is a 61 y.o. male with atherosclerosis of *** native arteries of *** causing {Chronic PAD levels:25303}.  Patient counseled {pad risk2:26283}  WIfI score calculated based on clinical exam and non-invasive measurements. {WIFIvascular:26096}  Recommend the following which can slow the progression of atherosclerosis and reduce the risk of major adverse cardiac / limb events:  Complete cessation from all tobacco products. Blood glucose control with goal A1c < 7%. Blood pressure control with goal blood pressure < 140/90 mmHg. Lipid reduction therapy with goal LDL-C <100 mg/dL (<70 if symptomatic from PAD).  Aspirin 572m PO QD.  *** Clopidogrel 752mPO QD. *** Rivaroxaban 2.72m29mO BID. *** Cilostozal 100m41m BID for intermittent claudication without evidence of heart failure. Atorvastatin 40-80mg PO QD (or other "high intensity" statin therapy). *** Daily walking to and past the point of discomfort. Patient counseled to keep a log of exercise distance. *** Adequate hydration (at least 2 liters / day) if patient's heart and kidney function is adequate.  Plan *** lower extremity angiogram with possible intervention via *** approach in cath lab ***.    CHIEF COMPLAINT: ***  HISTORY OF PRESENT ILLNESS: Cody FLURYa 60 y55. male ***  VASCULAR SURGICAL HISTORY:  Right femoro-popliteal angioplasty and stenting (08/11/21) Right TMA (DudSharol Given3/23)  VASCULAR RISK FACTORS: {FINDINGS; POSITIVE NEGATIVE:705-071-0277} history of stroke / transient ischemic attack. {FINDINGS; POSITIVE NEGATIVE:705-071-0277} history of coronary artery disease. *** history of PCI. *** history of CABG.  {FINDINGS; POSITIVE NEGATIVE:705-071-0277} history of diabetes mellitus. Last A1c ***. {FINDINGS; POSITIVE NEGATIVE:705-071-0277} history of smoking. *** actively smoking. {FINDINGS; POSITIVE NEGATIVE:705-071-0277} history of hypertension.  *** drug regimen with *** control. {FINDINGS; POSITIVE NEGATIVE:705-071-0277} history of chronic kidney disease.  Last GFR ***. CKD {stage:30421363}. {FINDINGS; POSITIVE NEGATIVE:705-071-0277} history of chronic obstructive pulmonary disease, treated with ***.  FUNCTIONAL STATUS: ECOG performance status: {findings; ecog performance status:31780} Ambulatory status: {TNHAmbulation:25868}  Past Medical History:  Diagnosis Date   Diabetes mellitus without complication (HCC)[place] Hypertension    Peripheral vascular disease (HCC)[place]  Past Surgical History:  Procedure Laterality Date   ABDOMINAL AORTOGRAM W/LOWER EXTREMITY N/A 08/11/2021   Procedure: ABDOMINAL AORTOGRAM W/LOWER EXTREMITY;  Surgeon: HawkCherre Robins;  [facility];  Service: Cardiovascular;  Laterality: N/A;   AMPUTATION Left 11/15/2017   Procedure: LEFT FOOT 2ND RAY AMPUTATION;  Surgeon: DudaNewt Minion;  [facility];  Laterality: Left;   AMPUTATION Right 08/25/2021   Procedure: RIGHT TRANSMETATARSAL AMPUTATION;  Surgeon: DudaNewt Minion;  [facility] [place];  Laterality: Right;   CERVICAL FUSION     IR ANGIOGRAM EXTREMITY LEFT  11/14/2017   IR ANGIOGRAM SELECTIVE EACH ADDITIONAL VESSEL  11/14/2017   IR ANGIOGRAM SELECTIVE EACH ADDITIONAL VESSEL  11/14/2017   IR TIB-PERO ART PTA MOD SED  11/14/2017   IR TIB-PERO ART UNI PTA EA ADD VESSEL MOD SED  11/14/2017   IR US G[place]DE VASC ACCESS LEFT  11/14/2017   PERIPHERAL VASCULAR INTERVENTION Right 08/11/2021   Procedure: PERIPHERAL VASCULAR INTERVENTION;  Surgeon: HawkCherre Robins;  [facility];  Service: Cardiovascular;  Laterality: Right;  rt sfa stent    No family history on file.  Social History   Socioeconomic History   Marital status: Married    Spouse name: Not on file   Number of children: Not on file  Years of education: Not on file   Highest education level: Not on file  Occupational  History   Not on file  Tobacco Use   Smoking status: Former    Years: 47.00    Types: Cigarettes    Quit date: 08/11/2021    Years since quitting: 0.0   Smokeless tobacco: Never  Vaping Use   Vaping Use: Some days  Substance and Sexual Activity   Alcohol use: No   Drug use: No   Sexual activity: Not on file  Other Topics Concern   Not on file  Social History Narrative   Not on file   Social Determinants of Health   Financial Resource Strain: Not on file  Food Insecurity: Not on file  Transportation Needs: Not on file  Physical Activity: Not on file  Stress: Not on file  Social Connections: Not on file  Intimate Partner Violence: Not on file    No Known Allergies  Current Outpatient Medications  Medication Sig Dispense Refill   Accu-Chek Softclix Lancets lancets Use up to four times daily as directed. 100 each 0   albuterol (VENTOLIN HFA) 108 (90 Base) MCG/ACT inhaler Inhale 2 puffs into the lungs every 6 (six) hours as needed for shortness of breath.     aspirin 81 MG EC tablet Take 1 tablet (81 mg total) by mouth daily. Swallow whole. 150 tablet 2   atorvastatin (LIPITOR) 80 MG tablet Take 1 tablet (80 mg total) by mouth daily. 30 tablet 3   Blood Glucose Monitoring Suppl (BLOOD GLUCOSE MONITOR SYSTEM) w/Device KIT Use up to four times daily as directed. 1 kit 0   citalopram (CELEXA) 40 MG tablet Take 1 tablet (40 mg total) by mouth daily. 30 tablet 3   clonazePAM (KLONOPIN) 0.5 MG tablet Take 0.5 mg by mouth daily.     clopidogrel (PLAVIX) 75 MG tablet Take 1 tablet (75 mg total) by mouth daily. 30 tablet 3   doxycycline (VIBRA-TABS) 100 MG tablet Take 1 tablet (100 mg total) by mouth 2 (two) times daily. 28 tablet 0   gabapentin (NEURONTIN) 600 MG tablet Take 1 tablet (600 mg total) by mouth 3 (three) times daily. 90 tablet 3   glucose blood test strip Use as directed up to four times daily. 100 each 0   ibuprofen (ADVIL) 200 MG tablet Take 800 mg by mouth every 6  (six) hours as needed for moderate pain or headache.     Insulin Glargine (BASAGLAR KWIKPEN) 100 UNIT/ML Inject 24 Units into the skin at bedtime. 9 mL 2   insulin glargine (LANTUS SOLOSTAR) 100 UNIT/ML Solostar Pen Inject 24 Units into the skin at bedtime.     insulin lispro (HUMALOG) 100 UNIT/ML KwikPen use 3 times daily with meals BG<70-120 0unit; BG121-150 2unit; BG151-200 3unit; BG 201-250 5unit; BG251-300 8unit; BG301-350 11unit; BG351-400 15 units BG>400call MD 15 mL 11   Insulin Pen Needle 32G X 4 MM MISC Use as directed with insulin pens. 200 each 0   loperamide (IMODIUM) 2 MG capsule Take 1 capsule (2 mg total) by mouth as needed for diarrhea or loose stools. 15 capsule 0   metFORMIN (GLUCOPHAGE) 500 MG tablet Take 1 tablet (500 mg total) by mouth 2 (two) times daily with a meal. 60 tablet 11   Multiple Vitamin (MULTIVITAMIN WITH MINERALS) TABS tablet Take 1 tablet by mouth daily. 30 tablet 0   nicotine (NICODERM CQ - DOSED IN MG/24 HOURS) 21 mg/24hr patch Place 1 patch (21 mg  total) onto the skin daily. 28 patch 0   oxyCODONE-acetaminophen (PERCOCET) 10-325 MG tablet Take 1 tablet by mouth every 4 (four) hours as needed for pain. 30 tablet 0   Vitamin D, Ergocalciferol, (DRISDOL) 1.25 MG (50000 UNIT) CAPS capsule Take 1 capsule (50,000 Units total) by mouth every 7 (seven) days. 5 capsule 0   No current facility-administered medications for this visit.    REVIEW OF SYSTEMS:  [X] denotes positive finding, [ ] denotes negative finding Cardiac  Comments:  Chest pain or chest pressure: ***   Shortness of breath upon exertion:    Short of breath when lying flat:    Irregular heart rhythm:        Vascular    Pain in calf, thigh, or hip brought on by ambulation:    Pain in feet at night that wakes you up from your sleep:     Blood clot in your veins:    Leg swelling:         Pulmonary    Oxygen at home:    Productive cough:     Wheezing:         Neurologic    Sudden weakness in  arms or legs:     Sudden numbness in arms or legs:     Sudden onset of difficulty speaking or slurred speech:    Temporary loss of vision in one eye:     Problems with dizziness:         Gastrointestinal    Blood in stool:     Vomited blood:         Genitourinary    Burning when urinating:     Blood in urine:        Psychiatric    Major depression:         Hematologic    Bleeding problems:    Problems with blood clotting too easily:        Skin    Rashes or ulcers:        Constitutional    Fever or chills:      PHYSICAL EXAM There were no vitals filed for this visit.  Constitutional: *** appearing. *** distress. Appears *** nourished.  Neurologic: CN ***. *** focal findings. *** sensory loss. Psychiatric: *** Mood and affect symmetric and appropriate. Eyes: *** No icterus. No conjunctival pallor. Ears, nose, throat: *** mucous membranes moist. Midline trachea.  Cardiac: *** rate and rhythm.  Respiratory: *** unlabored. Abdominal: *** soft, non-tender, non-distended.  Peripheral vascular: *** Extremity: *** edema. *** cyanosis. *** pallor.  Skin: *** gangrene. *** ulceration.  Lymphatic: *** Stemmer's sign. *** palpable lymphadenopathy.  PERTINENT LABORATORY AND RADIOLOGIC DATA  Most recent CBC CBC Latest Ref Rng & Units 08/25/2021 08/11/2021 07/25/2021  WBC 4.0 - 10.5 K/uL 11.8(H) - 9.0  Hemoglobin 13.0 - 17.0 g/dL 14.0 15.0 15.9  Hematocrit 39.0 - 52.0 % 42.3 44.0 47.4  Platelets 150 - 400 K/uL 218 - 140(L)     Most recent CMP CMP Latest Ref Rng & Units 08/25/2021 08/11/2021 07/25/2021  Glucose 70 - 99 mg/dL 180(H) 158(H) 223(H)  BUN 8 - 23 mg/dL 7(L) 14 16  Creatinine 0.61 - 1.24 mg/dL 0.77 0.90 1.12  Sodium 135 - 145 mmol/L 133(L) 139 134(L)  Potassium 3.5 - 5.1 mmol/L 4.3 3.8 4.0  Chloride 98 - 111 mmol/L 98 96(L) 98  CO2 22 - 32 mmol/L 28 - 28  Calcium 8.9 - 10.3 mg/dL 8.4(L) - 8.7(L)  Total Protein 6.5 -  8.1 g/dL - - -  Total Bilirubin 0.3 - 1.2  mg/dL - - -  Alkaline Phos 38 - 126 U/L - - -  AST 15 - 41 U/L - - -  ALT 0 - 44 U/L - - -    Renal function CrCl cannot be calculated (Unknown ideal weight.).  Hgb A1c MFr Bld (%)  Date Value  07/23/2021 12.5 (H)    LDL Cholesterol  Date Value Ref Range Status  07/24/2021 48 0 - 99 mg/dL Final    Comment:           Total Cholesterol/HDL:CHD Risk Coronary Heart Disease Risk Table                     Men   Women  1/2 Average Risk   3.4   3.3  Average Risk       5.0   4.4  2 X Average Risk   9.6   7.1  3 X Average Risk  23.4   11.0        Use the calculated Patient Ratio above and the CHD Risk Table to determine the patient's CHD Risk.        ATP III CLASSIFICATION (LDL):  <100     mg/dL   Optimal  100-129  mg/dL   Near or Above                    Optimal  130-159  mg/dL   Borderline  160-189  mg/dL   High  >190     mg/dL   Very High Performed at [facility] [address], [place], [place] [address]      Vascular Imaging: ***  Yevonne Aline. Stanford Breed, MD [facility] Phone Number: [phone] 09/11/2021 8:26 PM  Total time spent on preparing this encounter including chart review, data review, collecting history, examining the patient, coordinating care for this {tnhtimebilling:26202}  Portions of this report may have been transcribed using voice recognition software.  Every effort has been made to ensure accuracy; however, inadvertent computerized transcription errors may still be present.

## 2021-09-12 ENCOUNTER — Encounter: Payer: 59 | Admitting: Vascular Surgery

## 2021-09-12 ENCOUNTER — Encounter (HOSPITAL_COMMUNITY): Payer: 59

## 2021-09-16 ENCOUNTER — Inpatient Hospital Stay (HOSPITAL_COMMUNITY): Payer: 59

## 2021-09-16 ENCOUNTER — Encounter (HOSPITAL_COMMUNITY): Payer: Self-pay | Admitting: Emergency Medicine

## 2021-09-16 ENCOUNTER — Emergency Department (HOSPITAL_COMMUNITY): Payer: 59

## 2021-09-16 ENCOUNTER — Inpatient Hospital Stay (HOSPITAL_COMMUNITY)
Admission: EM | Admit: 2021-09-16 | Discharge: 2021-09-20 | DRG: 565 | Disposition: A | Discharge status: Home Health | Payer: 59 | Attending: Family Medicine | Admitting: Family Medicine

## 2021-09-16 ENCOUNTER — Other Ambulatory Visit: Payer: Self-pay

## 2021-09-16 DIAGNOSIS — L0390 Cellulitis, unspecified: Secondary | ICD-10-CM | POA: Diagnosis not present

## 2021-09-16 DIAGNOSIS — Z7984 Long term (current) use of oral hypoglycemic drugs: Secondary | ICD-10-CM | POA: Diagnosis not present

## 2021-09-16 DIAGNOSIS — Z7982 Long term (current) use of aspirin: Secondary | ICD-10-CM

## 2021-09-16 DIAGNOSIS — F17210 Nicotine dependence, cigarettes, uncomplicated: Secondary | ICD-10-CM | POA: Diagnosis present

## 2021-09-16 DIAGNOSIS — T8781 Dehiscence of amputation stump: Secondary | ICD-10-CM | POA: Diagnosis present

## 2021-09-16 DIAGNOSIS — T8743 Infection of amputation stump, right lower extremity: Principal | ICD-10-CM | POA: Diagnosis present

## 2021-09-16 DIAGNOSIS — F32A Depression, unspecified: Secondary | ICD-10-CM | POA: Diagnosis present

## 2021-09-16 DIAGNOSIS — I739 Peripheral vascular disease, unspecified: Secondary | ICD-10-CM | POA: Diagnosis not present

## 2021-09-16 DIAGNOSIS — E1151 Type 2 diabetes mellitus with diabetic peripheral angiopathy without gangrene: Secondary | ICD-10-CM | POA: Diagnosis present

## 2021-09-16 DIAGNOSIS — Z20822 Contact with and (suspected) exposure to covid-19: Secondary | ICD-10-CM | POA: Diagnosis present

## 2021-09-16 DIAGNOSIS — I10 Essential (primary) hypertension: Secondary | ICD-10-CM | POA: Diagnosis present

## 2021-09-16 DIAGNOSIS — Z794 Long term (current) use of insulin: Secondary | ICD-10-CM | POA: Diagnosis not present

## 2021-09-16 DIAGNOSIS — E119 Type 2 diabetes mellitus without complications: Secondary | ICD-10-CM

## 2021-09-16 DIAGNOSIS — E1165 Type 2 diabetes mellitus with hyperglycemia: Secondary | ICD-10-CM | POA: Diagnosis not present

## 2021-09-16 DIAGNOSIS — E785 Hyperlipidemia, unspecified: Secondary | ICD-10-CM

## 2021-09-16 DIAGNOSIS — L97519 Non-pressure chronic ulcer of other part of right foot with unspecified severity: Secondary | ICD-10-CM | POA: Diagnosis present

## 2021-09-16 DIAGNOSIS — Z79899 Other long term (current) drug therapy: Secondary | ICD-10-CM

## 2021-09-16 DIAGNOSIS — L03115 Cellulitis of right lower limb: Secondary | ICD-10-CM

## 2021-09-16 DIAGNOSIS — E11621 Type 2 diabetes mellitus with foot ulcer: Secondary | ICD-10-CM | POA: Diagnosis present

## 2021-09-16 DIAGNOSIS — T8149XA Infection following a procedure, other surgical site, initial encounter: Secondary | ICD-10-CM | POA: Diagnosis not present

## 2021-09-16 DIAGNOSIS — E1142 Type 2 diabetes mellitus with diabetic polyneuropathy: Secondary | ICD-10-CM | POA: Diagnosis present

## 2021-09-16 DIAGNOSIS — Z7902 Long term (current) use of antithrombotics/antiplatelets: Secondary | ICD-10-CM | POA: Diagnosis not present

## 2021-09-16 DIAGNOSIS — Y838 Other surgical procedures as the cause of abnormal reaction of the patient, or of later complication, without mention of misadventure at the time of the procedure: Secondary | ICD-10-CM | POA: Diagnosis present

## 2021-09-16 LAB — BASIC METABOLIC PANEL
Anion gap: 8 (ref 5–15)
BUN: 6 mg/dL — ABNORMAL LOW (ref 8–23)
CO2: 24 mmol/L (ref 22–32)
Calcium: 8.3 mg/dL — ABNORMAL LOW (ref 8.9–10.3)
Chloride: 97 mmol/L — ABNORMAL LOW (ref 98–111)
Creatinine, Ser: 0.77 mg/dL (ref 0.61–1.24)
GFR, Estimated: >60 mL/min (ref >60)
Glucose, Bld: 252 mg/dL — ABNORMAL HIGH (ref 70–99)
Potassium: 3.8 mmol/L (ref 3.5–5.1)
Sodium: 129 mmol/L — ABNORMAL LOW (ref 135–145)

## 2021-09-16 LAB — CBC WITH DIFFERENTIAL/PLATELET
Abs Immature Granulocytes: 0.03 10*3/uL (ref 0.00–0.07)
Basophils Absolute: 0.1 10*3/uL (ref 0.0–0.1)
Basophils Relative: 1 %
Eosinophils Absolute: 0.3 10*3/uL (ref 0.0–0.5)
Eosinophils Relative: 3 %
HCT: 36.3 % — ABNORMAL LOW (ref 39.0–52.0)
Hemoglobin: 12.1 g/dL — ABNORMAL LOW (ref 13.0–17.0)
Immature Granulocytes: 0 %
Lymphocytes Relative: 18 %
Lymphs Abs: 1.7 10*3/uL (ref 0.7–4.0)
MCH: 31.5 pg (ref 26.0–34.0)
MCHC: 33.3 g/dL (ref 30.0–36.0)
MCV: 94.5 fL (ref 80.0–100.0)
Monocytes Absolute: 0.9 10*3/uL (ref 0.1–1.0)
Monocytes Relative: 10 %
Neutro Abs: 6.3 10*3/uL (ref 1.7–7.7)
Neutrophils Relative %: 68 %
Platelets: 152 10*3/uL (ref 150–400)
RBC: 3.84 MIL/uL — ABNORMAL LOW (ref 4.22–5.81)
RDW: 12.4 % (ref 11.5–15.5)
WBC: 9.3 10*3/uL (ref 4.0–10.5)
nRBC: 0 % (ref 0.0–0.2)

## 2021-09-16 LAB — RESP PANEL BY RT-PCR (FLU A&B, COVID) ARPGX2
Influenza A by PCR: NEGATIVE
Influenza B by PCR: NEGATIVE
SARS Coronavirus 2 by RT PCR: NEGATIVE

## 2021-09-16 LAB — CBG MONITORING, ED: Glucose-Capillary: 204 mg/dL — ABNORMAL HIGH (ref 70–99)

## 2021-09-16 LAB — LACTIC ACID, PLASMA: Lactic Acid, Venous: 1.7 mmol/L (ref 0.5–1.9)

## 2021-09-16 IMAGING — DX DG FOOT COMPLETE 3+V*R*
2 series · 3 of 3 positions shown · non-contrast
Comparison: [DATE]

CLINICAL DATA: Pain. Amputation on [DATE]. Now with fever,
whose Ng, and redness at the surgical site.

EXAM:
RIGHT FOOT COMPLETE - 3+ VIEW

[Series 1: foot · 0.14mm/px · 2 of 2 slices shown]
[im 1/2]
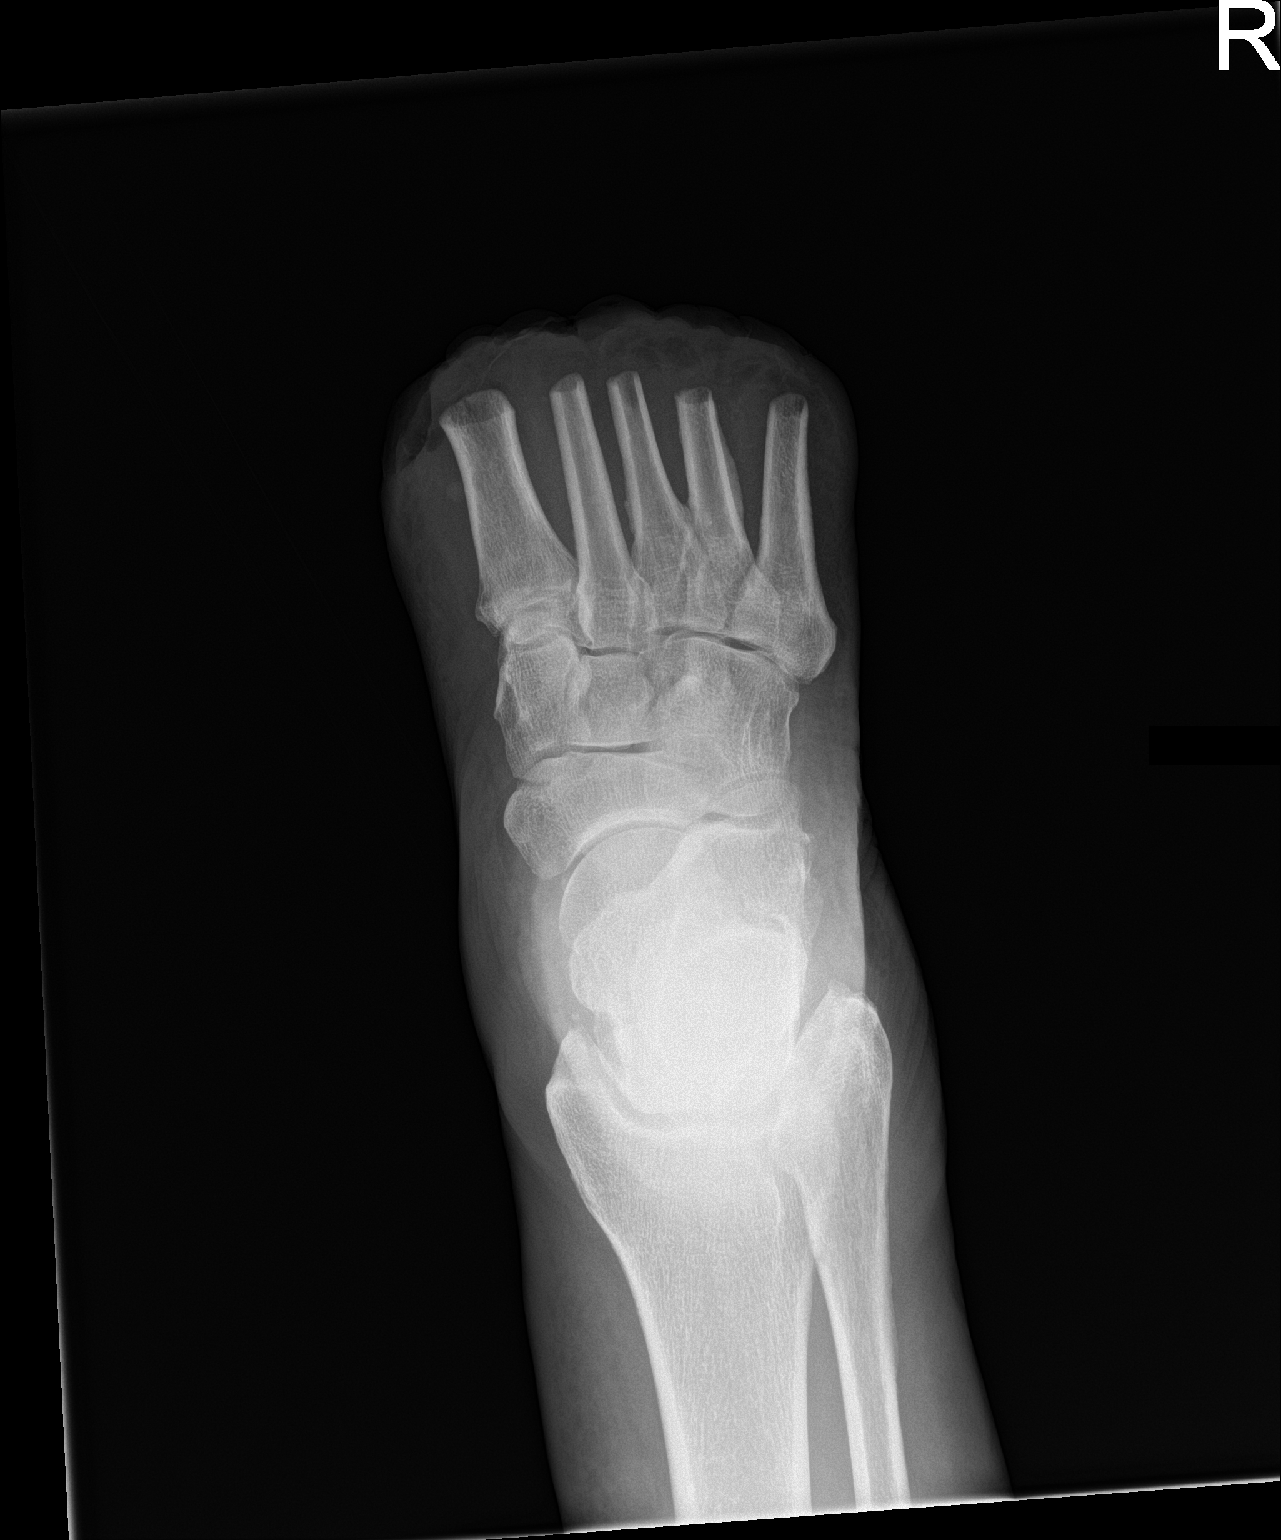
[im 2/2]
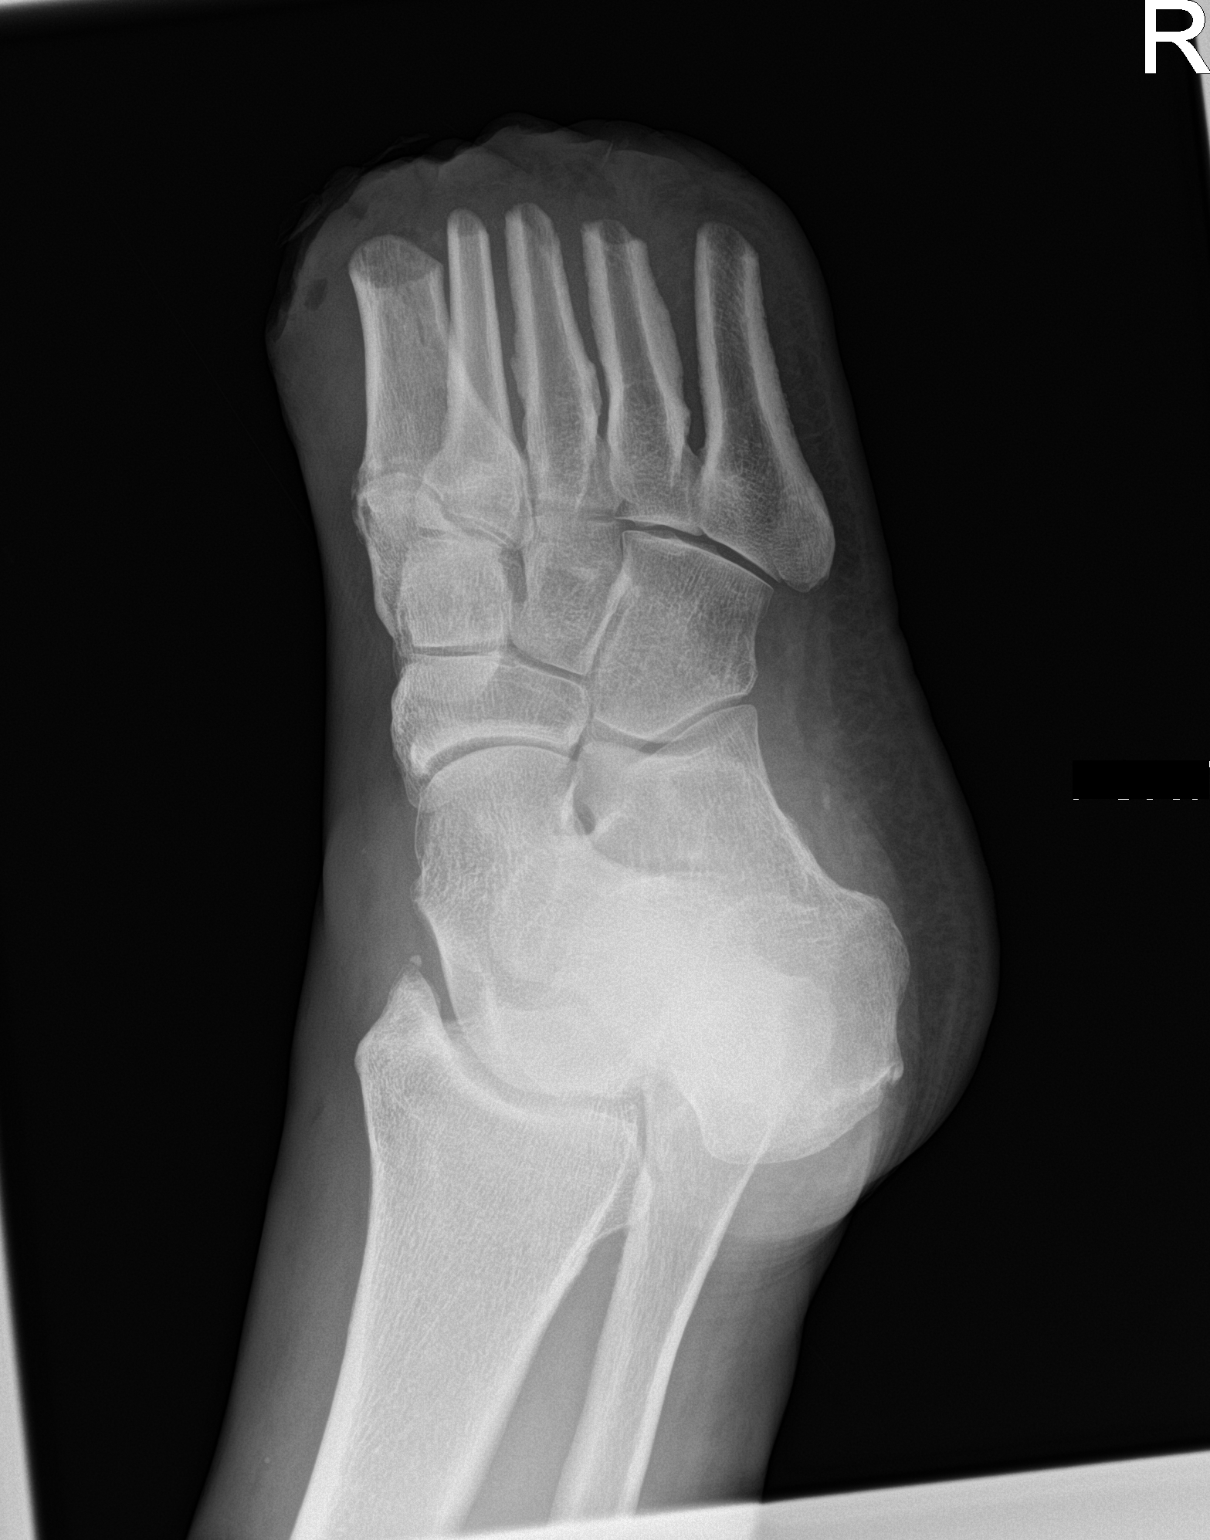

[leg]
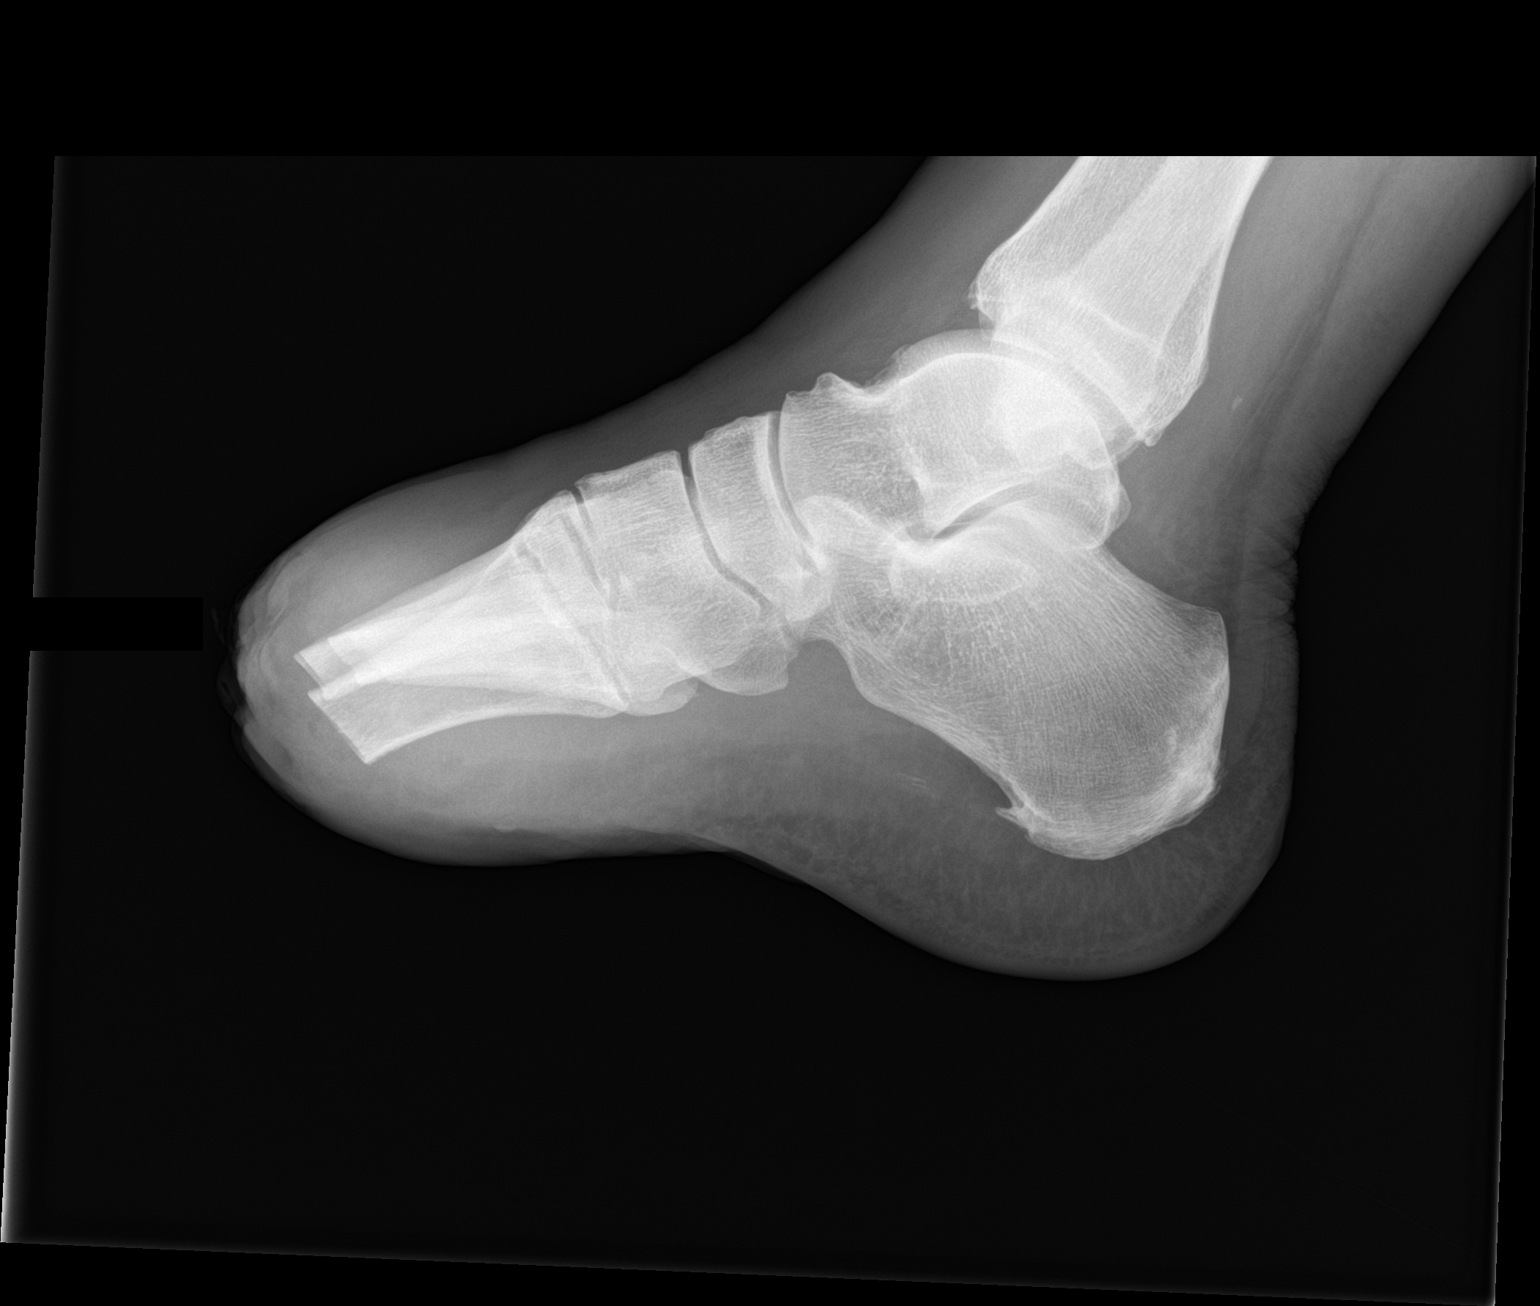

[3 of 3 positions shown; findings below may reference images not displayed]

FINDINGS: Interval amputation of the forefoot at the mid metatarsal level.
Bony margins appear intact. A small amount of sub cutaneous soft
tissue gas is demonstrated over the first metatarsal stump, possibly
indicating infection or ulceration. Mild degenerative changes in the
intertarsal joints. No evidence of acute fracture or dislocation.
IMPRESSION: Forefoot amputation at the mid metatarsal level. Bones appear
intact. Soft tissue gas adjacent to the first metatarsal stump
suggesting infection.

## 2021-09-16 IMAGING — MR MR FOOT*R* W/O CM
5 series · 40 of 40 positions shown · non-contrast
Comparison: None.

CLINICAL DATA: Fevers and chills.  Status post amputation.

EXAM:
MRI OF THE RIGHT FOREFOOT WITHOUT CONTRAST
TECHNIQUE: Multiplanar, multisequence MR imaging of the right forefoot was
performed. No intravenous contrast was administered.

[Series 3: T1 · axial · right · 3.0mm · 0.55mm/px · z∈[-63,+79]mm · 7 of 42 slices shown (1 of 2)]
[im 1/42]
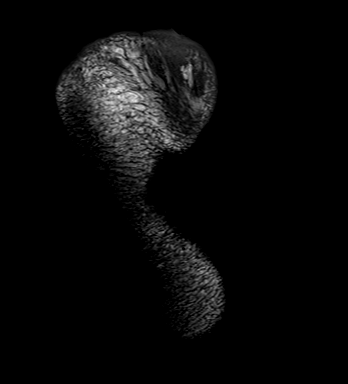
[im 7/42]
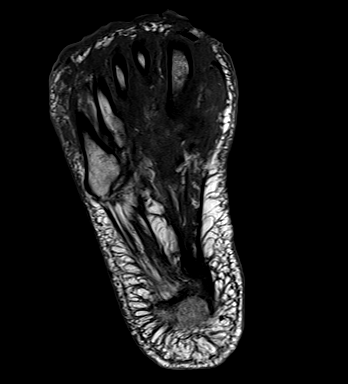
[im 14/42]
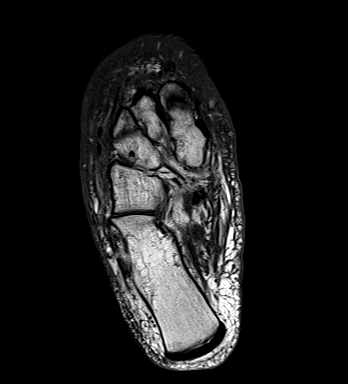
[im 21/42]
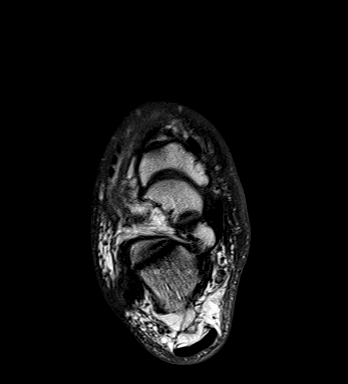
[im 28/42]
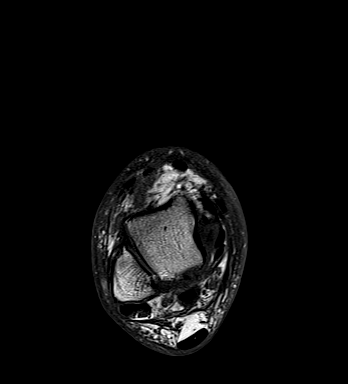
[im 35/42]
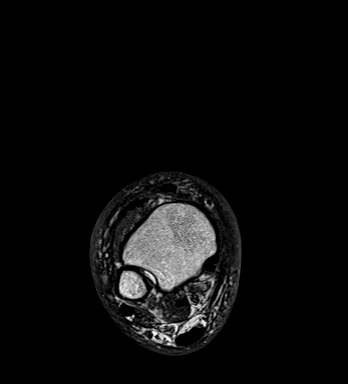
[im 42/42]
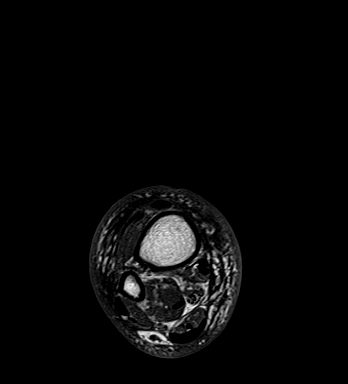

[Series 4: T2 fat-sat · axial · right · 3.0mm · 0.57mm/px · z∈[-65,+78]mm · 8 of 42 slices shown (1 of 2)]
[im 1/42]
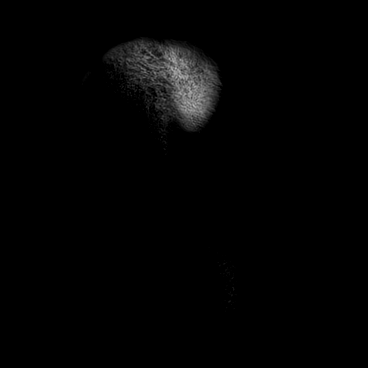
[im 6/42]
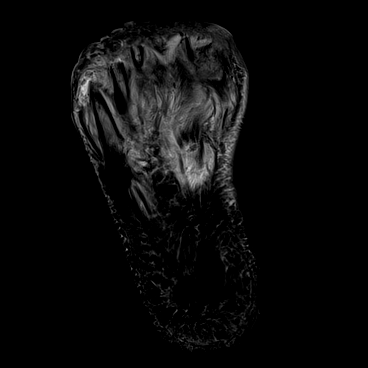
[im 12/42]
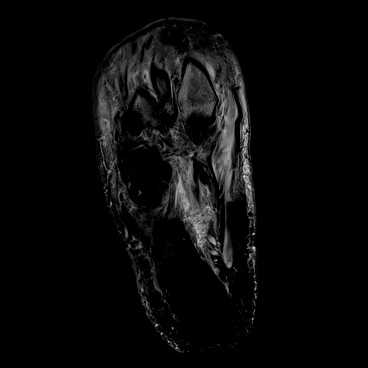
[im 18/42]
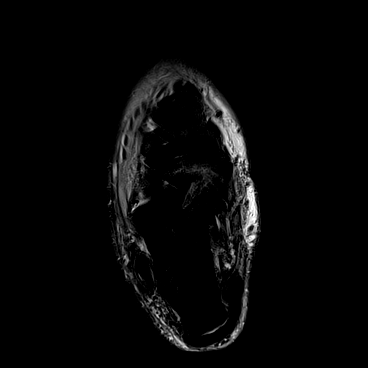
[im 24/42]
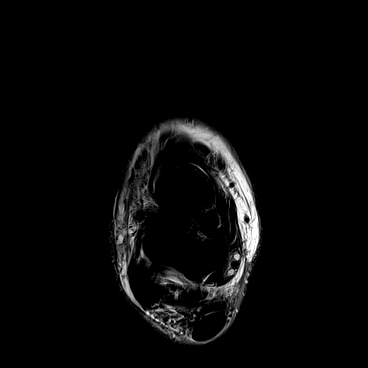
[im 30/42]
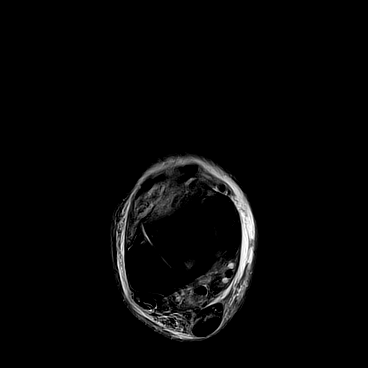
[im 36/42]
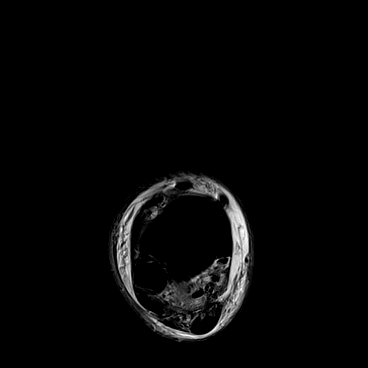
[im 42/42]
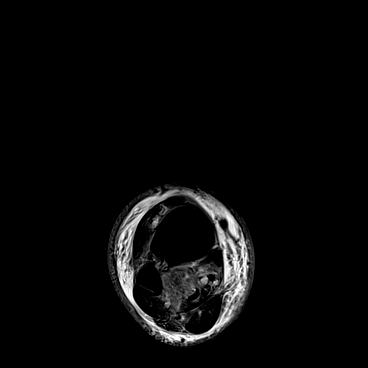

[Series 5: T1 · coronal · right · 3.3mm · 0.55mm/px · 9 of 46 slices shown (2 of 2)]
[im 1/46]
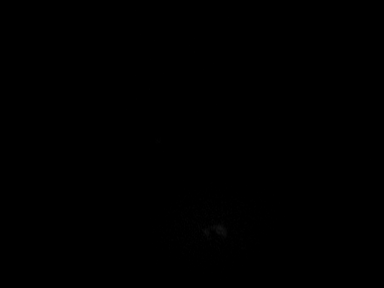
[im 6/46]
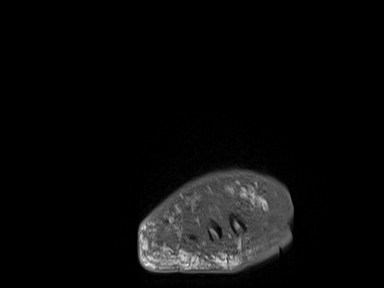
[im 12/46]
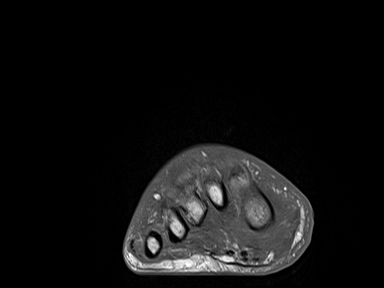
[im 17/46]
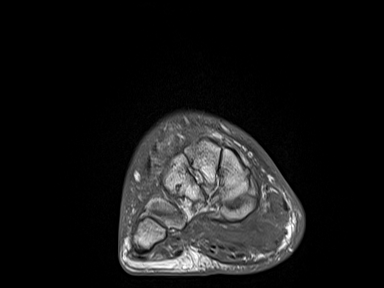
[im 23/46]
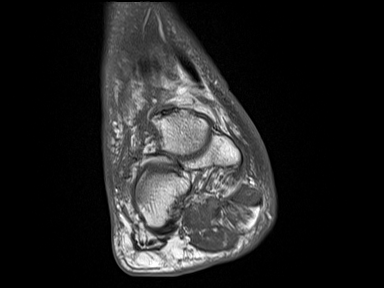
[im 29/46]
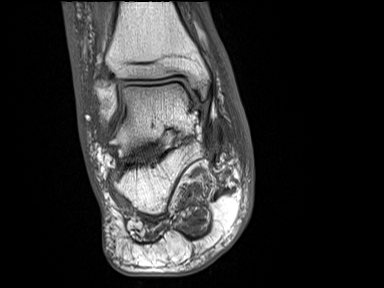
[im 34/46]
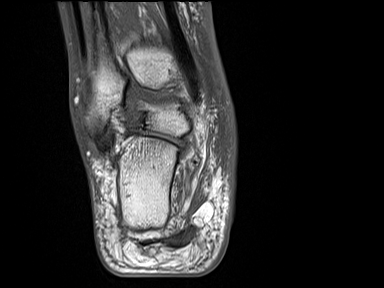
[im 40/46]
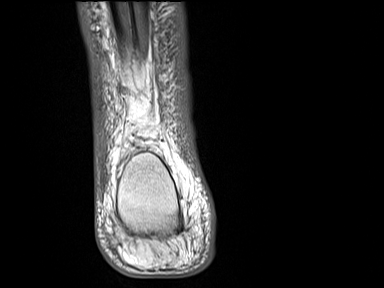
[im 46/46]
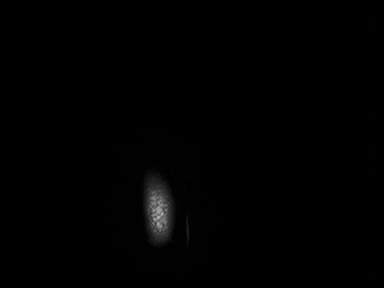

[Series 7: STIR · sagittal · right · 3.0mm · 0.82mm/px · 7 of 38 slices shown]
[im 1/38]
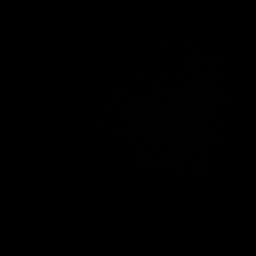
[im 7/38]
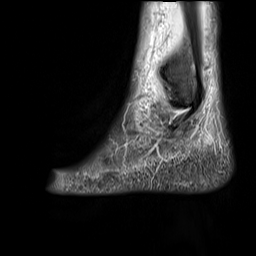
[im 13/38]
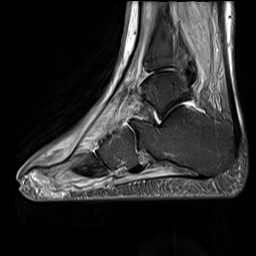
[im 19/38]
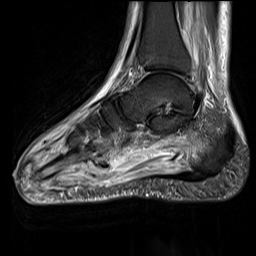
[im 25/38]
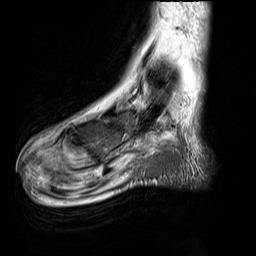
[im 31/38]
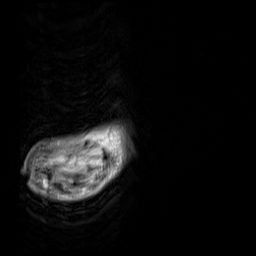
[im 38/38]
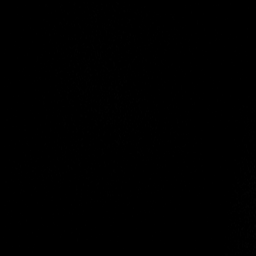

[Series 8: T2 fat-sat · coronal · right · 3.3mm · 0.62mm/px · 9 of 46 slices shown (2 of 2)]
[im 1/46]
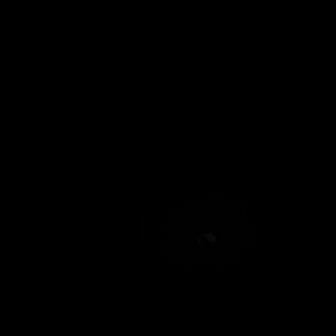
[im 6/46]
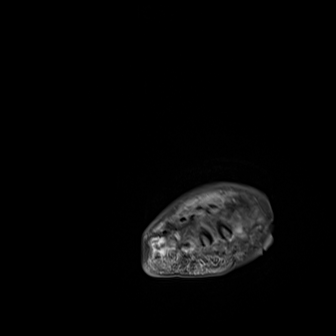
[im 12/46]
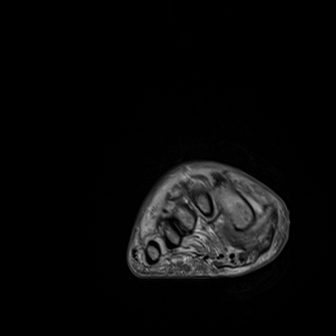
[im 17/46]
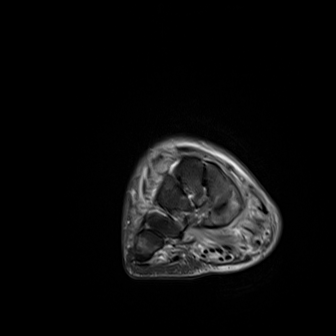
[im 23/46]
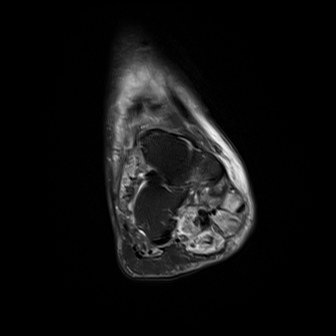
[im 29/46]
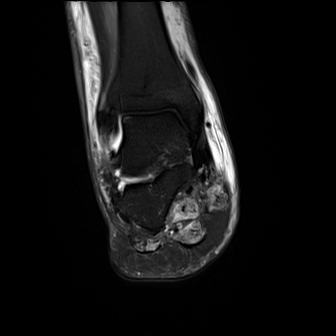
[im 34/46]
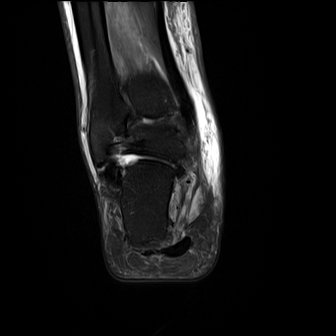
[im 40/46]
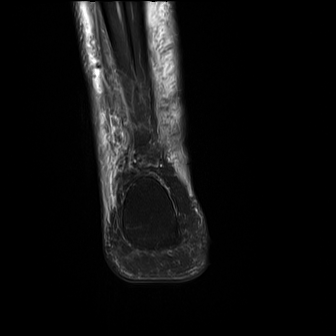
[im 46/46]
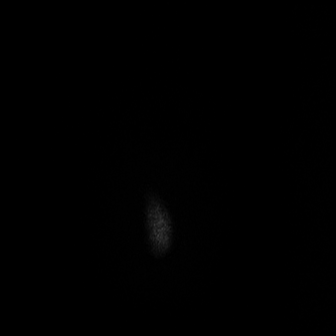

[40 of 40 positions shown; findings below may reference images not displayed]

FINDINGS: Bones/Joint/Cartilage

Transmetatarsal amputation through the metatarsal necks. Bone marrow
edema throughout the residual metatarsals which may be reactive
secondary to recent amputation versus osteomyelitis. Soft tissue
wound overlies the medial aspect of the first metatarsal stump.
Complex fluid collection abutting the fifth metatarsal stump
measuring 2.2 x 1.5 x 2 cm which may reflect a postoperative
hematoma versus abscess.

Normal alignment. No joint effusion.

Ligaments

Collateral ligaments are intact. Lisfranc ligament is intact.
Anterior and posterior talofibular ligaments are intact.
Calcaneofibular ligament is intact. Deltoid ligament is intact.

Muscles and Tendons

Moderate tendinosis of the peroneus longus with an interstitial
tear. Mild tendinosis of the peroneus brevis. Flexor, extensor and
Achilles tendons are intact. Generalized T2 hyperintense signal
throughout the plantar musculature likely neurogenic given the
patient's history of diabetes.

Soft tissue
No other fluid collection or hematoma. No soft tissue mass. Severe
soft tissue edema in the lower leg, around the ankle, and extending
into the dorsal aspect of the foot as can be seen with cellulitis.
IMPRESSION: 1. Soft tissue wound overlies the medial aspect of the first
metatarsal stump.
Transmetatarsal amputation through the metatarsal necks. Bone marrow
edema throughout the residual metatarsals which may be reactive
postsurgical changes secondary to recent amputation versus
osteomyelitis. Complex fluid collection abutting the fifth
metatarsal stump measuring 2.2 x 1.5 x 2 cm which may reflect a
postoperative hematoma versus abscess.
2. Moderate tendinosis of the peroneus longus with an interstitial
tear.

## 2021-09-16 MED ORDER — ATORVASTATIN CALCIUM 40 MG PO TABS: 80.0000 mg | ORAL_TABLET | Freq: Every day | ORAL | Status: DC

## 2021-09-16 MED ORDER — ENOXAPARIN SODIUM 40 MG/0.4ML IJ SOSY
40.0000 mg | PREFILLED_SYRINGE | INTRAMUSCULAR | Status: DC
  Administered 2021-09-16 – 2021-09-19 (×4): 40 mg via SUBCUTANEOUS
  Filled 2021-09-16 (×4): qty 0.4

## 2021-09-16 MED ORDER — NICOTINE 21 MG/24HR TD PT24: 21.0000 mg | MEDICATED_PATCH | Freq: Every day | TRANSDERMAL | Status: DC

## 2021-09-16 MED ORDER — CITALOPRAM HYDROBROMIDE 40 MG PO TABS
40.0000 mg | ORAL_TABLET | Freq: Every day | ORAL | Status: DC
  Administered 2021-09-16 – 2021-09-17 (×2): 40 mg via ORAL
  Filled 2021-09-16: qty 4
  Filled 2021-09-16: qty 1

## 2021-09-16 MED ORDER — CEFAZOLIN SODIUM-DEXTROSE 1-4 GM/50ML-% IV SOLN
1.0000 g | Freq: Once | INTRAVENOUS | Status: AC
Start: 2021-09-16 — End: 2021-09-16
  Administered 2021-09-16: 1 g via INTRAVENOUS
  Filled 2021-09-16: qty 50

## 2021-09-16 MED ORDER — IBUPROFEN 400 MG PO TABS
400.0000 mg | ORAL_TABLET | Freq: Four times a day (QID) | ORAL | Status: DC
  Administered 2021-09-17 – 2021-09-20 (×15): 400 mg via ORAL
  Filled 2021-09-16 (×15): qty 1

## 2021-09-16 MED ORDER — VANCOMYCIN HCL 2000 MG/400ML IV SOLN
2000.0000 mg | Freq: Once | INTRAVENOUS | Status: AC
  Administered 2021-09-16: 2000 mg via INTRAVENOUS
  Filled 2021-09-16: qty 400

## 2021-09-16 MED ORDER — VANCOMYCIN HCL 1500 MG/300ML IV SOLN
1500.0000 mg | Freq: Two times a day (BID) | INTRAVENOUS | Status: DC
  Administered 2021-09-17 – 2021-09-18 (×3): 1500 mg via INTRAVENOUS
  Filled 2021-09-16 (×4): qty 300

## 2021-09-16 MED ORDER — ALBUTEROL SULFATE HFA 108 (90 BASE) MCG/ACT IN AERS: 2.0000 | INHALATION_SPRAY | Freq: Four times a day (QID) | RESPIRATORY_TRACT | Status: DC | PRN

## 2021-09-16 MED ORDER — ACETAMINOPHEN 650 MG RE SUPP: 650.0000 mg | Freq: Four times a day (QID) | RECTAL | Status: DC

## 2021-09-16 MED ORDER — ONDANSETRON HCL 4 MG/2ML IJ SOLN
4.0000 mg | Freq: Once | INTRAMUSCULAR | Status: AC
  Administered 2021-09-16: 4 mg via INTRAVENOUS
  Filled 2021-09-16: qty 2

## 2021-09-16 MED ORDER — MORPHINE SULFATE (PF) 4 MG/ML IV SOLN
4.0000 mg | Freq: Once | INTRAVENOUS | Status: AC
  Administered 2021-09-16: 4 mg via INTRAVENOUS
  Filled 2021-09-16: qty 1

## 2021-09-16 MED ORDER — ADULT MULTIVITAMIN W/MINERALS CH
1.0000 | ORAL_TABLET | Freq: Every day | ORAL | Status: DC
  Administered 2021-09-17 – 2021-09-20 (×4): 1 via ORAL
  Filled 2021-09-16 (×4): qty 1

## 2021-09-16 MED ORDER — GABAPENTIN 300 MG PO CAPS
600.0000 mg | ORAL_CAPSULE | Freq: Three times a day (TID) | ORAL | Status: DC
Start: 2021-09-16 — End: 2021-09-21
  Administered 2021-09-16 – 2021-09-20 (×12): 600 mg via ORAL
  Filled 2021-09-16 (×12): qty 2

## 2021-09-16 MED ORDER — CLOPIDOGREL BISULFATE 75 MG PO TABS
75.0000 mg | ORAL_TABLET | Freq: Every day | ORAL | Status: DC
  Administered 2021-09-16 – 2021-09-20 (×5): 75 mg via ORAL
  Filled 2021-09-16 (×5): qty 1

## 2021-09-16 MED ORDER — CLOPIDOGREL BISULFATE 75 MG PO TABS: 75.0000 mg | ORAL_TABLET | Freq: Every day | ORAL | Status: DC

## 2021-09-16 MED ORDER — SODIUM CHLORIDE 0.9 % IV SOLN: INTRAVENOUS | Status: DC

## 2021-09-16 MED ORDER — CITALOPRAM HYDROBROMIDE 10 MG PO TABS: 40.0000 mg | ORAL_TABLET | Freq: Every day | ORAL | Status: DC

## 2021-09-16 MED ORDER — INSULIN ASPART 100 UNIT/ML IJ SOLN
0.0000 [IU] | Freq: Three times a day (TID) | INTRAMUSCULAR | Status: DC
  Administered 2021-09-17: 5 [IU] via SUBCUTANEOUS
  Administered 2021-09-17 (×2): 3 [IU] via SUBCUTANEOUS
  Administered 2021-09-18: 2 [IU] via SUBCUTANEOUS
  Administered 2021-09-18: 3 [IU] via SUBCUTANEOUS
  Administered 2021-09-18 – 2021-09-19 (×3): 2 [IU] via SUBCUTANEOUS
  Administered 2021-09-19: 5 [IU] via SUBCUTANEOUS
  Administered 2021-09-20 (×2): 2 [IU] via SUBCUTANEOUS

## 2021-09-16 MED ORDER — ATORVASTATIN CALCIUM 80 MG PO TABS
80.0000 mg | ORAL_TABLET | Freq: Every day | ORAL | Status: DC
  Administered 2021-09-16 – 2021-09-20 (×5): 80 mg via ORAL
  Filled 2021-09-16: qty 1
  Filled 2021-09-16: qty 2
  Filled 2021-09-16 (×3): qty 1

## 2021-09-16 MED ORDER — SODIUM CHLORIDE 0.9 % IV SOLN
2.0000 g | Freq: Three times a day (TID) | INTRAVENOUS | Status: DC
  Administered 2021-09-17 – 2021-09-18 (×4): 2 g via INTRAVENOUS
  Filled 2021-09-16 (×7): qty 2

## 2021-09-16 MED ORDER — ASPIRIN EC 81 MG PO TBEC: 81.0000 mg | DELAYED_RELEASE_TABLET | Freq: Every day | ORAL | Status: DC

## 2021-09-16 MED ORDER — INSULIN ASPART 100 UNIT/ML IJ SOLN
0.0000 [IU] | Freq: Every day | INTRAMUSCULAR | Status: DC
  Administered 2021-09-16: 2 [IU] via SUBCUTANEOUS

## 2021-09-16 MED ORDER — OXYCODONE HCL 5 MG PO TABS: 5.0000 mg | ORAL_TABLET | ORAL | Status: DC | PRN

## 2021-09-16 MED ORDER — ACETAMINOPHEN 325 MG PO TABS
650.0000 mg | ORAL_TABLET | Freq: Four times a day (QID) | ORAL | Status: DC
  Administered 2021-09-16 – 2021-09-20 (×14): 650 mg via ORAL
  Filled 2021-09-16 (×14): qty 2

## 2021-09-16 MED ORDER — SODIUM CHLORIDE 0.9 % IV BOLUS
1000.0000 mL | Freq: Once | INTRAVENOUS | Status: AC
  Administered 2021-09-16: 1000 mL via INTRAVENOUS

## 2021-09-16 MED ORDER — POLYETHYLENE GLYCOL 3350 17 G PO PACK: 17.0000 g | PACK | Freq: Every day | ORAL | Status: DC | PRN

## 2021-09-16 MED ORDER — NICOTINE 21 MG/24HR TD PT24
21.0000 mg | MEDICATED_PATCH | Freq: Every day | TRANSDERMAL | Status: DC
  Administered 2021-09-17 – 2021-09-20 (×5): 21 mg via TRANSDERMAL
  Filled 2021-09-16 (×5): qty 1

## 2021-09-16 MED ORDER — ASPIRIN EC 81 MG PO TBEC
81.0000 mg | DELAYED_RELEASE_TABLET | Freq: Every day | ORAL | Status: DC
  Administered 2021-09-16 – 2021-09-20 (×5): 81 mg via ORAL
  Filled 2021-09-16 (×5): qty 1

## 2021-09-16 MED ORDER — INSULIN GLARGINE-YFGN 100 UNIT/ML ~~LOC~~ SOLN
15.0000 [IU] | Freq: Every day | SUBCUTANEOUS | Status: DC
  Administered 2021-09-16 – 2021-09-18 (×3): 15 [IU] via SUBCUTANEOUS
  Filled 2021-09-16 (×4): qty 0.15

## 2021-09-16 MED ORDER — ALBUTEROL SULFATE (2.5 MG/3ML) 0.083% IN NEBU: 2.5000 mg | INHALATION_SOLUTION | Freq: Four times a day (QID) | RESPIRATORY_TRACT | Status: DC | PRN

## 2021-09-16 NOTE — Progress Notes (Signed)
Pharmacy Antibiotic Note  Cody Pena is a 61 y.o. male admitted on 09/16/2021 with  osteomyelitis .  Pharmacy has been consulted for vancomycin dosing.  SCr wnl. WBC wnl. LA 1.7.   Plan: -Vancomycin 2 gm IV load followed by Vancomycin 1500 mg IV Q 12 hrs. Goal AUC 400-550. Expected AUC: 540 SCr used: 0.77 -Monitor CBC, renal fx, cultures and clinical progress -Vanc levels as indicated -F/u maintenance gram negative coverage   Height: 6\' 4"  (193 cm) Weight: 105.7 kg (233 lb) IBW/kg (Calculated) : 86.8  Temp (24hrs), Avg:99.6 F (37.6 C), Min:99.6 F (37.6 C), Max:99.6 F (37.6 C)  No results for input(s): WBC, CREATININE, LATICACIDVEN, VANCOTROUGH, VANCOPEAK, VANCORANDOM, GENTTROUGH, GENTPEAK, GENTRANDOM, TOBRATROUGH, TOBRAPEAK, TOBRARND, AMIKACINPEAK, AMIKACINTROU, AMIKACIN in the last 168 hours.  CrCl cannot be calculated (Patient's most recent lab result is older than the maximum 21 days allowed.).    No Known Allergies  Antimicrobials this admission: Vancomycin 2/4 >>  Cefazolin 2/4 >>   Dose adjustments this admission:   Microbiology results: 2/4 BCx:    Thank you for allowing pharmacy to be a part of this patients care.  , PharmD., BCCCP Clinical Pharmacist Please refer to [facility] for unit-specific pharmacist

## 2021-09-16 NOTE — Progress Notes (Signed)
Pharmacy Antibiotic Note  Cody Pena is a 61 y.o. male admitted on 09/16/2021 with  osteomyelitis .  Pharmacy has been consulted for vancomycin and cefepime dosing.  SCr wnl. WBC wnl. LA 1.7.   Plan: -Vancomycin 2 gm IV load followed by Vancomycin 1500 mg IV Q 12 hrs. Goal AUC 400-550. Expected AUC: 540 SCr used: 0.77 -Cefepime 2g Q 8 hr -Monitor CBC, renal fx, cultures and clinical progress -Vanc levels as indicated -F/u maintenance gram negative coverage   Height: 6\' 4"  (193 cm) Weight: 105.7 kg (233 lb) IBW/kg (Calculated) : 86.8  Temp (24hrs), Avg:99.5 F (37.5 C), Min:99.3 F (37.4 C), Max:99.6 F (37.6 C)  Recent Labs  Lab 09/16/21 1700  WBC 9.3  CREATININE 0.77  LATICACIDVEN 1.7     Estimated Creatinine Clearance: 127.8 mL/min (by C-G formula based on SCr of 0.77 mg/dL).    No Known Allergies  Antimicrobials this admission: Vancomycin 2/4 >>  Cefazolin 2/4  Cefepime 2/4 >>   Dose adjustments this admission: N/a  Microbiology results: 2/4 BCx:    Thank you for allowing pharmacy to be a part of this patients care.  Benetta Spar, PharmD, BCPS, BCCP Clinical Pharmacist  Please check AMION for all [place] phone numbers After 10:00 PM, call [place] [phone]

## 2021-09-16 NOTE — H&P (Addendum)
[facility] Admission History and Physical Service Pager: [phone]  Patient name: Cody Pena Medical record number: [id number] Date of birth: Aug 24, 1960 Age: 61 y.o. Gender: male  Primary Care Provider: Tamsen Roers, MD Consultants: Ortho Code Status: FULL CODE Preferred Emergency Contact:  Contact Information     Name Relation Home Work Mobile   [place] Spouse   [phone]   Bogdan,[name]   [phone]       Chief Complaint: Fever, malodorous drainage, erythema of foot  Assessment and Plan: Cody Pena is a 61 y.o. male presenting with cellulitis of right lower extremity s/p transmetatarsal amputation on 1/13 for chronic diabetic foot ulcer. PMH is significant for PVD, DM, HTN, previous tobacco abuse, depression.   Cellulitis of RLE s/p right transmetatarsal amputation on 1/13, concern for osteomyelitis  Patient has severe PAD and poorly controlled diabetes. He has history of chronic diabetic ulcer beneath right forefoot, was started on multiple courses of antibiotics in the outpatient setting but due to continued erythema and drainage and concern for osteomyelitis he underwent transmetatarsal amputation of right foot on 1/13. He was seen in Ortho clinic for follow up on 1/20, was noted to have surrounding erythema without ascending cellulitis and placed on doxycycline for 2- weeks. Had second follow up on 1/27 and erythema appeared much improved and there was serosanguineous drainage noted. He completed his 2-week course of Doxy 2/3, but presented to ED today due to chills, reported hallucinations, subjective fever. In ED vitals stable and no concern for sepsis. Labs notable for normal WBC at 9.3, Hgb 12.1, lactic acid 1.7. Right foot x-ray with soft tissue gas adjacent to first metatarsal stump suggesting infection but no bony abnormalities. Received a dose of Ancef and Vanc in the ED. Pain control with Morphine in ED. Ortho was  consulted in the ED, recommended MRI and admission for further workup and management of wound. Concern that given his extensive PAD and poorly controlled diabetes he will have difficulty healing and may require additional amputation.  -Admit to [facility] med-tele, attending Dr. 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 Glucose elevated at 252 on BMP. Checks 24U Lantus daily and 8-10U of Humalog with meals, 500 mg Metformin BID.  -CBG AC and qHS -Semglee 15U; can increase as needed -mSSI  -Hold Metformin  -Carb/heart healthy diet -Continue Neurontin   HTN: Chronic, stable  Not on any anti-hypertensive's. BP stable, 115/49. -Monitor per floor protocol   HLD   PAD s/p left superficial femoral artery atherectomy and stenting in 2018, right superficial femoral and above-knee popliteal angioplasty and stenting 08/11/21 On Plavix and high-intensity statin. Had an arteriogram on 08/11/21 that showed severe disease. He underwent stenting of right SFA and popliteal on December 30th, was recommended to continue dual-antiplatelet therapy with ASA 81 mg and Plavix 75 mg daily.  -Continue Atorvastatin, Plavix, ASA  Previous Tobacco Use  Quit 1 month ago, now exclusively vapes. Over 80 pack year smoker. Requesting nicotine patch. States was prescribed Albuterol by PCP for times where "I  can't hardly catch my breath", has no formal diagnosis of asthma or COPD though given his extensive tobacco history I suspect component of underlying lung disease. -Offer  nicotine patch  -Albuterol PRN  -Consider outpatient PFT's  Depression: Chronic, stable Home medications include Celexa, Klonopin.  -Continue Celexa -Hold Klonopin  FEN/GI: Carb/Heart Healthy diet Prophylaxis: Lovenox  Disposition: Med-Tele  History of Present Illness:  Cody Pena is a 61 y.o. male presenting with chills, right foot pain and redness since yesterday.  "Yesterday I just got the shakes". States that his wife and daughter thought he was hallucinating, "I don't think I was hallucinating. I think I was having a dream that I was telling them about, I just wasn't awake yet". He has history of hallucinations in the past. States it last occurred when his cervical spine surgery got infected in 2016.   States he took two DayQuils and that seemed to help with the shakes, "I started feeling better. But then this morning it was right back again". He did not take temperature. He has had right foot pain, rates it a 5/10. For pain he has been taking Percocet which helps somewhat. His wound has been draining, "sometimes it's blood, sometimes it's clear". He completed his 2-weeks of Doxycycline yesterday. Thinks the redness may be somewhat improved, hard for him to tell.   Walking with walker since surgery. No recent falls.  Has had blurred vision off and on for a while "it's nothing new" Last BM yesterday, was normal.  Review Of Systems: Per HPI with the following additions:   Review of Systems  Constitutional:  Positive for chills, diaphoresis, fatigue and fever.  HENT:  Negative for congestion, rhinorrhea and sore throat.   Eyes:        +blurred vision  Respiratory:  Negative for cough, shortness of breath and wheezing.   Cardiovascular:  Negative for chest pain, palpitations and leg swelling.  Gastrointestinal:  Negative for abdominal pain, constipation, diarrhea, nausea and vomiting.  Genitourinary:  Negative for dysuria and hematuria.  Musculoskeletal:  Negative for  myalgias.  Skin:  Positive for color change and wound.  Neurological:  Negative for dizziness, tremors, syncope, weakness and headaches.  Psychiatric/Behavioral:  Positive for hallucinations.     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 ([place]) 01/14/2018   Ulcer of left foot, limited to breakdown of skin ([place]) 01/14/2018   Foot osteomyelitis, left ([place])    Septic arthritis of foot ([place]) 11/11/2017   Osteomyelitis of ankle or foot, acute, left ([place]) 11/11/2017   Type 2 DM with diabetic peripheral angiopathy w/o gangrene ([place]) 11/11/2017   Cigarette smoker 11/11/2017   Diabetic peripheral neuropathy ([place]) 11/11/2017   Diabetic foot infection ([place]) 11/10/2017   Post op infection    Delirium 12/07/2014   Cervical vertebral fusion 12/07/2014   Hallucination 12/07/2014   Mood disorder ([place]) 12/07/2014   Pyrexia    Neck swelling     Past Medical History: Past Medical History:  Diagnosis Date   Diabetes mellitus without complication ([place])    Hypertension    Peripheral vascular disease ([place])     Past Surgical History: Past Surgical History:  Procedure Laterality Date   ABDOMINAL AORTOGRAM W/LOWER EXTREMITY N/A 08/11/2021  Procedure: ABDOMINAL AORTOGRAM W/LOWER EXTREMITY;  Surgeon: Cherre Robins, MD;  Location: [facility];  Service: Cardiovascular;  Laterality: N/A;   AMPUTATION Left 11/15/2017   Procedure: LEFT FOOT 2ND RAY AMPUTATION;  Surgeon: Newt Minion, MD;  Location: [facility];  Service: Orthopedics;  Laterality: Left;   AMPUTATION Right 08/25/2021   Procedure: RIGHT TRANSMETATARSAL AMPUTATION;  Surgeon: Newt Minion, MD;  Location: [facility];  Service: Orthopedics;  Laterality: Right;    CERVICAL FUSION     IR ANGIOGRAM EXTREMITY LEFT  11/14/2017   IR ANGIOGRAM SELECTIVE EACH ADDITIONAL VESSEL  11/14/2017   IR ANGIOGRAM SELECTIVE EACH ADDITIONAL VESSEL  11/14/2017   IR TIB-PERO ART PTA MOD SED  11/14/2017   IR TIB-PERO ART UNI PTA EA ADD VESSEL MOD SED  11/14/2017   IR US GUIDE VASC ACCESS LEFT  11/14/2017   PERIPHERAL VASCULAR INTERVENTION Right 08/11/2021   Procedure: PERIPHERAL VASCULAR INTERVENTION;  Surgeon: Cherre Robins, MD;  Location: [facility];  Service: Cardiovascular;  Laterality: Right;  rt sfa stent    Social History: Social History   Tobacco Use   Smoking status: Every Day    Years: 47.00    Types: Cigarettes    Last attempt to quit: 08/11/2021    Years since quitting: 0.0   Smokeless tobacco: Never  Vaping Use   Vaping Use: Some days  Substance Use Topics   Alcohol use: No   Drug use: No   Additional social history: No alcohol. Previous tobacco use (over 80 pack year smoker), now vapes exclusively within the last hour.  Lives with wife, daughter and 71-year old grandson. Walks independently but using walker currently. Please also refer to relevant sections of EMR.  Family History: History reviewed. No pertinent family history.   Allergies and Medications: No Known Allergies No current facility-administered medications on file prior to encounter.   Current Outpatient Medications on File Prior to Encounter  Medication Sig Dispense Refill   Accu-Chek Softclix Lancets lancets Use up to four times daily as directed. 100 each 0   albuterol (VENTOLIN HFA) 108 (90 Base) MCG/ACT inhaler Inhale 2 puffs into the lungs every 6 (six) hours as needed for shortness of breath.     aspirin 81 MG EC tablet Take 1 tablet (81 mg total) by mouth daily. Swallow whole. 150 tablet 2   atorvastatin (LIPITOR) 80 MG tablet Take 1 tablet (80 mg total) by mouth daily. 30 tablet 3   Blood Glucose Monitoring Suppl (BLOOD GLUCOSE MONITOR SYSTEM) w/Device KIT Use up to four  times daily as directed. 1 kit 0   citalopram (CELEXA) 40 MG tablet Take 1 tablet (40 mg total) by mouth daily. 30 tablet 3   clonazePAM (KLONOPIN) 0.5 MG tablet Take 0.5 mg by mouth daily.     clopidogrel (PLAVIX) 75 MG tablet Take 1 tablet (75 mg total) by mouth daily. 30 tablet 3   doxycycline (VIBRA-TABS) 100 MG tablet Take 1 tablet (100 mg total) by mouth 2 (two) times daily. 28 tablet 0   gabapentin (NEURONTIN) 600 MG tablet Take 1 tablet (600 mg total) by mouth 3 (three) times daily. 90 tablet 3   glucose blood test strip Use as directed up to four times daily. 100 each 0   ibuprofen (ADVIL) 200 MG tablet Take 800 mg by mouth every 6 (six) hours as needed for moderate pain or headache.     Insulin Glargine (BASAGLAR KWIKPEN) 100 UNIT/ML Inject 24 Units into the  skin at bedtime. 9 mL 2   insulin glargine (LANTUS SOLOSTAR) 100 UNIT/ML Solostar Pen Inject 24 Units into the skin at bedtime.     insulin lispro (HUMALOG) 100 UNIT/ML KwikPen use 3 times daily with meals BG<70-120 0unit; BG121-150 2unit; BG151-200 3unit; BG 201-250 5unit; BG251-300 8unit; BG301-350 11unit; BG351-400 15 units BG>400call MD 15 mL 11   Insulin Pen Needle 32G X 4 MM MISC Use as directed with insulin pens. 200 each 0   loperamide (IMODIUM) 2 MG capsule Take 1 capsule (2 mg total) by mouth as needed for diarrhea or loose stools. 15 capsule 0   metFORMIN (GLUCOPHAGE) 500 MG tablet Take 1 tablet (500 mg total) by mouth 2 (two) times daily with a meal. 60 tablet 11   Multiple Vitamin (MULTIVITAMIN WITH MINERALS) TABS tablet Take 1 tablet by mouth daily. 30 tablet 0   nicotine (NICODERM CQ - DOSED IN MG/24 HOURS) 21 mg/24hr patch Place 1 patch (21 mg total) onto the skin daily. 28 patch 0   oxyCODONE-acetaminophen (PERCOCET) 10-325 MG tablet Take 1 tablet by mouth every 4 (four) hours as needed for pain. 30 tablet 0   Vitamin D, Ergocalciferol, (DRISDOL) 1.25 MG (50000 UNIT) CAPS capsule Take 1 capsule (50,000 Units total) by  mouth every 7 (seven) days. 5 capsule 0    Objective: BP 123/68    Pulse 84    Temp 99.3 F (37.4 C) (Oral)    Resp 14    Ht 6' 4" (1.93 m)    Wt 105.7 kg    SpO2 97%    BMI 28.36 kg/m  Exam: General: Somewhat disheveled appearance, pleasant, in NAD  Eyes: Sclera anicteric  ENTM: Nares patent, dry MM  Neck: Supple Cardiovascular: RRR without murmur, 2+ radial pulses b/l, 1+ DP, 2+ PT pulse LLE, unable to appreciate DP or PT pulses in RLE Respiratory: CTAB without wheezing/rhonchi/rales Gastrointestinal: Abdomen soft, non-distended, non-tender in all quadrants, normoactive bowel sounds MSK: S/p right transmetatarsal fracture Derm: RLE with erythema, increased warmth and 2+ pitting edema to knee. Sutures still in place to RLE, scant serosanguinous discharge to area of amputation, slightly malodorous; LLE without edema, dried blood on toes  Neuro: Awake, answers questions appropriately, follows commands, no focal deficits  Psych: Normal mood and affect          Labs and Imaging: CBC BMET  Recent Labs  Lab 09/16/21 1700  WBC 9.3  HGB 12.1*  HCT 36.3*  PLT 152   Recent Labs  Lab 09/16/21 1700  NA 129*  K 3.8  CL 97*  CO2 24  BUN 6*  CREATININE 0.77  GLUCOSE 252*  CALCIUM 8.3*     EKG: NSR 89 bpm, incomplete LBBB, stable when compared to 08/11/2021  DG Foot Complete Right  Result Date: 09/16/2021 CLINICAL DATA:  Pain. Amputation on January 13. Now with fever, whose Ng, and redness at the surgical site. EXAM: RIGHT FOOT COMPLETE - 3+ VIEW COMPARISON:  07/23/2021 FINDINGS: Interval amputation of the forefoot at the mid metatarsal level. Bony margins appear intact. A small amount of sub cutaneous soft tissue gas is demonstrated over the first metatarsal stump, possibly indicating infection or ulceration. Mild degenerative changes in the intertarsal joints. No evidence of acute fracture or dislocation. IMPRESSION: Forefoot amputation at the mid metatarsal level. Bones  appear intact. Soft tissue gas adjacent to the first metatarsal stump suggesting infection. Electronically Signed   By: Lucienne Capers M.D.   On: 09/16/2021 18:06  Sharion Settler, DO 09/16/2021, 7:34 PM PGY-2, [facility] Intern pager: [phone], text pages welcome

## 2021-09-16 NOTE — ED Triage Notes (Signed)
Patient brought in by son in law- states he is unsure what is going on but he has been having chills and hallucinations starting yesterday. Pt had half of right foot amputated on 13th of last month. States he took a pain pill PTA. Finished antibiotics yesterday.

## 2021-09-16 NOTE — ED Provider Notes (Signed)
[facility] EMERGENCY DEPARTMENT Provider Note   CSN: [id number] Arrival date & time: 09/16/21  1641     History  Chief Complaint  Patient presents with   Fever    Cody Pena is a 61 y.o. male.  Pt is a 61 yo wm with a hx of osteomyelitis, PVD, DM, and HTN.  He developed osteomyelitis in his right foot and required a transmetatarsal amputation on 1/13 by Dr. Sharol Given.  He followed up in the office on 1/20 and was put on doxy for slight erythema.  He followed up again on 1/27 and it was noted that his incision had opened a little bit and there was no active drainage or odor.  Erythema was better.  Pt developed fevers and chills and hallucinations yesterday.  He finished his abx yesterday.  His wound is now red and has a foul-smelling discharge.  Right leg is also red and swollen.         Home Medications Prior to Admission medications   Medication Sig Start Date End Date Taking? Authorizing Provider  Accu-Chek Softclix Lancets lancets Use up to four times daily as directed. 07/26/21   Ghimire, Henreitta Leber, MD  albuterol (VENTOLIN HFA) 108 (90 Base) MCG/ACT inhaler Inhale 2 puffs into the lungs every 6 (six) hours as needed for shortness of breath. 07/21/21   [provider]  aspirin 81 MG EC tablet Take 1 tablet (81 mg total) by mouth daily. Swallow whole. 08/11/21 08/11/22  Cherre Robins, MD  atorvastatin (LIPITOR) 80 MG tablet Take 1 tablet (80 mg total) by mouth daily. 07/26/21   Ghimire, Henreitta Leber, MD  Blood Glucose Monitoring Suppl (BLOOD GLUCOSE MONITOR SYSTEM) w/Device KIT Use up to four times daily as directed. 07/26/21   Ghimire, Henreitta Leber, MD  citalopram (CELEXA) 40 MG tablet Take 1 tablet (40 mg total) by mouth daily. 07/26/21   Ghimire, Henreitta Leber, MD  clonazePAM (KLONOPIN) 0.5 MG tablet Take 0.5 mg by mouth daily. 06/30/21   [provider]  clopidogrel (PLAVIX) 75 MG tablet Take 1 tablet (75 mg total) by mouth daily. 07/26/21    Ghimire, Henreitta Leber, MD  doxycycline (VIBRA-TABS) 100 MG tablet Take 1 tablet (100 mg total) by mouth 2 (two) times daily. 09/01/21   Suzan Slick, NP  gabapentin (NEURONTIN) 600 MG tablet Take 1 tablet (600 mg total) by mouth 3 (three) times daily. 07/26/21   Ghimire, Henreitta Leber, MD  glucose blood test strip Use as directed up to four times daily. 07/26/21   Ghimire, Henreitta Leber, MD  ibuprofen (ADVIL) 200 MG tablet Take 800 mg by mouth every 6 (six) hours as needed for moderate pain or headache.    [provider]  Insulin Glargine (BASAGLAR KWIKPEN) 100 UNIT/ML Inject 24 Units into the skin at bedtime. 07/26/21   Ghimire, Henreitta Leber, MD  insulin glargine (LANTUS SOLOSTAR) 100 UNIT/ML Solostar Pen Inject 24 Units into the skin at bedtime.    [provider]  insulin lispro (HUMALOG) 100 UNIT/ML KwikPen use 3 times daily with meals BG<70-120 0unit; BG121-150 2unit; BG151-200 3unit; BG 201-250 5unit; BG251-300 8unit; BG301-350 11unit; BG351-400 15 units BG>400call MD 07/26/21   Jonetta Osgood, MD  Insulin Pen Needle 32G X 4 MM MISC Use as directed with insulin pens. 07/26/21   Ghimire, Henreitta Leber, MD  loperamide (IMODIUM) 2 MG capsule Take 1 capsule (2 mg total) by mouth as needed for diarrhea or loose stools. 07/26/21   Ghimire,  Henreitta Leber, MD  metFORMIN (GLUCOPHAGE) 500 MG tablet Take 1 tablet (500 mg total) by mouth 2 (two) times daily with a meal. 07/26/21 07/26/22  Ghimire, Henreitta Leber, MD  Multiple Vitamin (MULTIVITAMIN WITH MINERALS) TABS tablet Take 1 tablet by mouth daily. 11/17/17   Kayleen Memos, DO  nicotine (NICODERM CQ - DOSED IN MG/24 HOURS) 21 mg/24hr patch Place 1 patch (21 mg total) onto the skin daily. 11/17/17   Kayleen Memos, DO  oxyCODONE-acetaminophen (PERCOCET) 10-325 MG tablet Take 1 tablet by mouth every 4 (four) hours as needed for pain. 08/25/21   Newt Minion, MD  Vitamin D, Ergocalciferol, (DRISDOL) 1.25 MG (50000 UNIT) CAPS capsule Take 1 capsule (50,000 Units  total) by mouth every 7 (seven) days. 07/26/21   Ghimire, Henreitta Leber, MD      Allergies    Patient has no known allergies.    Review of Systems   Review of Systems  Skin:  Positive for color change.  All other systems reviewed and are negative.  Physical Exam Updated Vital Signs BP (!) 115/49    Pulse 92    Temp 99.3 F (37.4 C) (Oral)    Resp 20    Ht 6' 4"  (1.93 m)    Wt 105.7 kg    SpO2 99%    BMI 28.36 kg/m  Physical Exam Vitals and nursing note reviewed.  Constitutional:      Appearance: Normal appearance.  HENT:     Head: Normocephalic and atraumatic.     Right Ear: External ear normal.     Left Ear: External ear normal.     Nose: Nose normal.     Mouth/Throat:     Mouth: Mucous membranes are dry.  Eyes:     Extraocular Movements: Extraocular movements intact.     Conjunctiva/sclera: Conjunctivae normal.     Pupils: Pupils are equal, round, and reactive to light.  Cardiovascular:     Rate and Rhythm: Normal rate and regular rhythm.     Pulses: Normal pulses.     Heart sounds: Normal heart sounds.  Pulmonary:     Effort: Pulmonary effort is normal.     Breath sounds: Normal breath sounds.  Abdominal:     General: Abdomen is flat. Bowel sounds are normal.     Palpations: Abdomen is soft.  Musculoskeletal:        General: Normal range of motion.     Cervical back: Normal range of motion and neck supple.  Skin:    Capillary Refill: Capillary refill takes less than 2 seconds.     Comments: See pictures  Neurological:     General: No focal deficit present.     Mental Status: He is alert and oriented to person, place, and time.  Psychiatric:        Mood and Affect: Mood normal.        Behavior: Behavior normal.     ED Results / Procedures / Treatments   Labs (all labs ordered are listed, but only abnormal results are displayed) Labs Reviewed  BASIC METABOLIC PANEL - Abnormal; Notable for the following components:      Result Value   Sodium 129 (*)     Chloride 97 (*)    Glucose, Bld 252 (*)    BUN 6 (*)    Calcium 8.3 (*)    All other components within normal limits  CBC WITH DIFFERENTIAL/PLATELET - Abnormal; Notable for the following components:   RBC 3.84 (*)  Hemoglobin 12.1 (*)    HCT 36.3 (*)    All other components within normal limits  CULTURE, BLOOD (ROUTINE X 2)  RESP PANEL BY RT-PCR (FLU A&B, COVID) ARPGX2  LACTIC ACID, PLASMA  CBC  COMPREHENSIVE METABOLIC PANEL    EKG EKG Interpretation  Date/Time:  Saturday September 16 2021 20:05:50 EST Ventricular Rate:  89 PR Interval:  172 QRS Duration: 102 QT Interval:  377 QTC Calculation: 459 R Axis:   -31 Text Interpretation: Sinus rhythm Left axis deviation No significant change since last tracing Confirmed by Isla Pence [id number]) on 09/16/2021 8:07:01 PM  Radiology DG Foot Complete Right  Result Date: 09/16/2021 CLINICAL DATA:  Pain. Amputation on January 13. Now with fever, whose Ng, and redness at the surgical site. EXAM: RIGHT FOOT COMPLETE - 3+ VIEW COMPARISON:  07/23/2021 FINDINGS: Interval amputation of the forefoot at the mid metatarsal level. Bony margins appear intact. A small amount of sub cutaneous soft tissue gas is demonstrated over the first metatarsal stump, possibly indicating infection or ulceration. Mild degenerative changes in the intertarsal joints. No evidence of acute fracture or dislocation. IMPRESSION: Forefoot amputation at the mid metatarsal level. Bones appear intact. Soft tissue gas adjacent to the first metatarsal stump suggesting infection. Electronically Signed   By: Lucienne Capers M.D.   On: 09/16/2021 18:06    Procedures Procedures    Medications Ordered in ED Medications  sodium chloride 0.9 % bolus 1,000 mL (0 mLs Intravenous Stopped 09/16/21 1955)    And  0.9 %  sodium chloride infusion (has no administration in time range)  vancomycin (VANCOREADY) IVPB 2000 mg/400 mL (has no administration in time range)  vancomycin  (VANCOREADY) IVPB 1500 mg/300 mL (has no administration in time range)  aspirin EC tablet 81 mg (has no administration in time range)  atorvastatin (LIPITOR) tablet 80 mg (has no administration in time range)  citalopram (CELEXA) tablet 40 mg (has no administration in time range)  nicotine (NICODERM CQ - dosed in mg/24 hours) patch 21 mg (has no administration in time range)  clopidogrel (PLAVIX) tablet 75 mg (has no administration in time range)  gabapentin (NEURONTIN) tablet 600 mg (has no administration in time range)  multivitamin with minerals tablet 1 tablet (has no administration in time range)  albuterol (VENTOLIN HFA) 108 (90 Base) MCG/ACT inhaler 2 puff (has no administration in time range)  enoxaparin (LOVENOX) injection 40 mg (has no administration in time range)  acetaminophen (TYLENOL) tablet 650 mg (has no administration in time range)    Or  acetaminophen (TYLENOL) suppository 650 mg (has no administration in time range)  ibuprofen (ADVIL) tablet 400 mg (has no administration in time range)  polyethylene glycol (MIRALAX / GLYCOLAX) packet 17 g (has no administration in time range)  insulin glargine-yfgn (SEMGLEE) injection 15 Units (has no administration in time range)  insulin aspart (novoLOG) injection 0-15 Units (has no administration in time range)  insulin aspart (novoLOG) injection 0-5 Units (has no administration in time range)  ceFAZolin (ANCEF) IVPB 1 g/50 mL premix (0 g Intravenous Stopped 09/16/21 1830)  morphine (PF) 4 MG/ML injection 4 mg (4 mg Intravenous Given 09/16/21 1821)  ondansetron (ZOFRAN) injection 4 mg (4 mg Intravenous Given 09/16/21 1822)    ED Course/ Medical Decision Making/ A&P                           Medical Decision Making Amount and/or Complexity of Data Reviewed Labs: ordered. Radiology:  ordered.  Risk Prescription drug management. Decision regarding hospitalization.   Due to wound infection, pt was given Vancomycin and Ancef.  Pt's  xray shows the bones to look ok.  Pt d/w Dr. Lorin [facility]) who is on for Dr. Sharol Given.  He asked that I put in a MRI of the foot and put Dr. Sharol Given on the treatment team.  This is done.  Pt has no contraindications to MRI.  Dr. Sharol Given has an OR day on Tuesday, 2/7, so if things are not improving, he can take him then.  Pt d/w FP residents for admission.  MRI pending at admission.        Final Clinical Impression(s) / ED Diagnoses Final diagnoses:  Wound infection after surgery  Cellulitis of right lower extremity  Poorly controlled type 2 diabetes mellitus [facility])    Rx / DC Orders ED Discharge Orders     None         Isla Pence, MD 09/16/21 2016

## 2021-09-17 ENCOUNTER — Inpatient Hospital Stay (HOSPITAL_COMMUNITY): Payer: 59

## 2021-09-17 DIAGNOSIS — L0390 Cellulitis, unspecified: Secondary | ICD-10-CM

## 2021-09-17 DIAGNOSIS — I739 Peripheral vascular disease, unspecified: Secondary | ICD-10-CM

## 2021-09-17 LAB — COMPREHENSIVE METABOLIC PANEL
ALT: 10 U/L (ref 0–44)
AST: 10 U/L — ABNORMAL LOW (ref 15–41)
Albumin: 2.5 g/dL — ABNORMAL LOW (ref 3.5–5.0)
Alkaline Phosphatase: 78 U/L (ref 38–126)
Anion gap: 9 (ref 5–15)
BUN: 5 mg/dL — ABNORMAL LOW (ref 8–23)
CO2: 28 mmol/L (ref 22–32)
Calcium: 8.3 mg/dL — ABNORMAL LOW (ref 8.9–10.3)
Chloride: 100 mmol/L (ref 98–111)
Creatinine, Ser: 0.76 mg/dL (ref 0.61–1.24)
GFR, Estimated: >60 mL/min (ref >60)
Glucose, Bld: 153 mg/dL — ABNORMAL HIGH (ref 70–99)
Potassium: 3.9 mmol/L (ref 3.5–5.1)
Sodium: 137 mmol/L (ref 135–145)
Total Bilirubin: 0.4 mg/dL (ref 0.3–1.2)
Total Protein: 5.7 g/dL — ABNORMAL LOW (ref 6.5–8.1)

## 2021-09-17 LAB — GLUCOSE, CAPILLARY
Glucose-Capillary: 165 mg/dL — ABNORMAL HIGH (ref 70–99)
Glucose-Capillary: 214 mg/dL — ABNORMAL HIGH (ref 70–99)
Glucose-Capillary: 162 mg/dL — ABNORMAL HIGH (ref 70–99)
Glucose-Capillary: 172 mg/dL — ABNORMAL HIGH (ref 70–99)

## 2021-09-17 LAB — CBC
HCT: 34.2 % — ABNORMAL LOW (ref 39.0–52.0)
Hemoglobin: 11.2 g/dL — ABNORMAL LOW (ref 13.0–17.0)
MCH: 31 pg (ref 26.0–34.0)
MCHC: 32.7 g/dL (ref 30.0–36.0)
MCV: 94.7 fL (ref 80.0–100.0)
Platelets: 195 10*3/uL (ref 150–400)
RBC: 3.61 MIL/uL — ABNORMAL LOW (ref 4.22–5.81)
RDW: 12.4 % (ref 11.5–15.5)
WBC: 8.2 10*3/uL (ref 4.0–10.5)
nRBC: 0 % (ref 0.0–0.2)

## 2021-09-17 MED ORDER — OXYCODONE HCL 5 MG PO TABS
5.0000 mg | ORAL_TABLET | ORAL | Status: DC | PRN
  Administered 2021-09-17 – 2021-09-18 (×4): 10 mg via ORAL
  Administered 2021-09-18 (×2): 5 mg via ORAL
  Administered 2021-09-18 – 2021-09-20 (×8): 10 mg via ORAL
  Filled 2021-09-17 (×14): qty 2

## 2021-09-17 MED ORDER — CITALOPRAM HYDROBROMIDE 20 MG PO TABS
20.0000 mg | ORAL_TABLET | Freq: Every day | ORAL | Status: DC
  Administered 2021-09-18 – 2021-09-20 (×3): 20 mg via ORAL
  Filled 2021-09-17 (×3): qty 1

## 2021-09-17 MED ORDER — CLONAZEPAM 0.5 MG PO TABS
0.5000 mg | ORAL_TABLET | Freq: Three times a day (TID) | ORAL | Status: DC
  Administered 2021-09-17 (×3): 0.5 mg via ORAL
  Filled 2021-09-17 (×3): qty 1

## 2021-09-17 NOTE — Progress Notes (Signed)
Family Medicine Teaching Service Daily Progress Note Intern Pager: [phone]  Patient name: Cody Pena Medical record number: [id number] Date of birth: August 25, 1960 Age: 61 y.o. Gender: male  Primary Care Provider: Aida Puffer, MD Consultants: Orthopedic surgery  Code Status: Full  Pt Overview and Major Events to Date:  Admitted: 2/4  Assessment and Plan: Cody Pena is a 61 y.o. male presenting with cellulitis of right lower extremity s/p transmetatarsal amputation on 1/13 for chronic diabetic foot ulcer. PMH is significant for PVD, DM, HTN, previous tobacco abuse and depression.   Right lower extremity cellulitis  Patient presented with fever and chills over the past 2 days. He is now s/p right transmetatarsal amputation on 08/25/21. Further complicated by history of uncontrolled diabetes and PAD. Maintained routine ortho follow up, placed on 2 weeks doxycycline although he notes that he has noticed erythema for days since surgery with minimal, intermittent improvement. Reassuringly no evidence of leukocytosis with WBC 8.2 and lactic acid 1.7 within normal limits. Blood cultures pending. Radiologic imaging notable for soft tissue gas adjacent to the first metatarsal stump indicative of possible infectious etiology. MRI right foot demonstrated soft tissue wound along medial aspect of first metatarsal stump with bone marrow edema possibly secondary to postsurgical changes. Presence of complex fluid collection of fifth metatarsal resembling possible abscess vs hematoma. S/p ancef  and vancomycin. S/p morphine for initial pain control. Admit for IV antibiotics and pain control along with orthopedic involvement.  -orthopedic surgery consulted, appreciate recommendations  -continue antibiotics: cefepime and vancomycin  -pain control: tylenol 650 q6h and oxycodone 5-10 mg q4h prn  -lovenox -awaiting ABIs -f/u blood cultures  -PT/OT recs   Type 2 DM Poorly controlled, most recent A1c 12.5  07/2021. Home diabetic regimen includes lantus 24 units, 8-10 units Humalong with meals and metformin 500 mg bid.  -moderate sSSI -15 units semglee -monitor CBGs, adjust regimen accordingly  -continue to hold home metformin  -heart healthy/carb modified diet -continue home gabapentin   Hypertension  Normotensive BPs, most recently 117/67.  -monitor BP   HLD   PAD  Patient is s/p left superficial femoral artery atherectomy and stenting in 2018, right superficial femoral and above-knee popliteal angioplasty and stenting 08/11/21. -continue atorvstatin -continue aspirin and plavix   Tobacco use -nicotine patch  -encourage cessation   Depression -continue home celexa -continue hone klonopin to prevent withdrawal symptoms    FEN/GI: heart healthy/carb modified diet  PPx: lovenox  Dispo:Home pending clinical improvement . Barriers include clinical improvement and orthopedics recommendations .   Subjective:  Patient denies any concerns this morning other than inquiring about possible discharge date, explained the possible course of this hospitalization and how it is difficult to determine at this time. Patient voices understanding. Denies any dyspnea or pain.   Objective: Temp:  [98.2 F (36.8 C)-99.6 F (37.6 C)] 98.2 F (36.8 C) (02/05 0805) Pulse Rate:  [75-97] 75 (02/05 0805) Resp:  [14-20] 18 (02/05 0805) BP: (111-125)/(49-75) 117/67 (02/05 0805) SpO2:  [92 %-99 %] 92 % (02/05 0805) Weight:  [105.7 kg] 105.7 kg (02/04 1649) Physical Exam: General: Patient laying comfortably in bed, in no acute distress.  Cardiovascular: RRR, no murmurs or gallops auscultated Respiratory: CTAB, no wheezing or rales, no evidence of focal findings  Abdomen: soft, nontender, nondistended, presence of bowel sounds  Extremities: radial pulses present bilaterally, distal pulses present along LLE, right foot covered in clean and dry dressing, presence of 1+ pitting edema along left ankle    Laboratory:  Recent Labs  Lab 09/16/21 1700 09/17/21 0047  WBC 9.3 8.2  HGB 12.1* 11.2*  HCT 36.3* 34.2*  PLT 152 195   Recent Labs  Lab 09/16/21 1700 09/17/21 0047  NA 129* 137  K 3.8 3.9  CL 97* 100  CO2 24 28  BUN 6* 5*  CREATININE 0.77 0.76  CALCIUM 8.3* 8.3*  PROT  --  5.7*  BILITOT  --  0.4  ALKPHOS  --  78  ALT  --  10  AST  --  10*  GLUCOSE 252* 153*      Imaging/Diagnostic Tests: MR FOOT RIGHT WO CONTRAST  Result Date: 09/17/2021 CLINICAL DATA:  Fevers and chills.  Status post amputation. EXAM: MRI OF THE RIGHT FOREFOOT WITHOUT CONTRAST TECHNIQUE: Multiplanar, multisequence MR imaging of the right forefoot was performed. No intravenous contrast was administered. COMPARISON:  None. FINDINGS: Bones/Joint/Cartilage Transmetatarsal amputation through the metatarsal necks. Bone marrow edema throughout the residual metatarsals which may be reactive secondary to recent amputation versus osteomyelitis. Soft tissue wound overlies the medial aspect of the first metatarsal stump. Complex fluid collection abutting the fifth metatarsal stump measuring 2.2 x 1.5 x 2 cm which may reflect a postoperative hematoma versus abscess. Normal alignment. No joint effusion. Ligaments Collateral ligaments are intact. Lisfranc ligament is intact. Anterior and posterior talofibular ligaments are intact. Calcaneofibular ligament is intact. Deltoid ligament is intact. Muscles and Tendons Moderate tendinosis of the peroneus longus with an interstitial tear. Mild tendinosis of the peroneus brevis. Flexor, extensor and Achilles tendons are intact. Generalized T2 hyperintense signal throughout the plantar musculature likely neurogenic given the patient's history of diabetes. Soft tissue No other fluid collection or hematoma. No soft tissue mass. Severe soft tissue edema in the lower leg, around the ankle, and extending into the dorsal aspect of the foot as can be seen with cellulitis. IMPRESSION: 1. Soft  tissue wound overlies the medial aspect of the first metatarsal stump. Transmetatarsal amputation through the metatarsal necks. Bone marrow edema throughout the residual metatarsals which may be reactive postsurgical changes secondary to recent amputation versus osteomyelitis. Complex fluid collection abutting the fifth metatarsal stump measuring 2.2 x 1.5 x 2 cm which may reflect a postoperative hematoma versus abscess. 2. Moderate tendinosis of the peroneus longus with an interstitial tear. Electronically Signed   By: Elige Ko M.D.   On: 09/17/2021 07:50   DG Foot Complete Right  Result Date: 09/16/2021 CLINICAL DATA:  Pain. Amputation on January 13. Now with fever, whose Ng, and redness at the surgical site. EXAM: RIGHT FOOT COMPLETE - 3+ VIEW COMPARISON:  07/23/2021 FINDINGS: Interval amputation of the forefoot at the mid metatarsal level. Bony margins appear intact. A small amount of sub cutaneous soft tissue gas is demonstrated over the first metatarsal stump, possibly indicating infection or ulceration. Mild degenerative changes in the intertarsal joints. No evidence of acute fracture or dislocation. IMPRESSION: Forefoot amputation at the mid metatarsal level. Bones appear intact. Soft tissue gas adjacent to the first metatarsal stump suggesting infection. Electronically Signed   By: Burman Nieves M.D.   On: 09/16/2021 18:06     Reece Leader, DO 09/17/2021, 9:58 AM PGY-2, [facility] FPTS Intern pager: [phone], text pages welcome

## 2021-09-17 NOTE — Progress Notes (Signed)
VASCULAR LAB    ABIs have been performed.  See CV proc for preliminary results.   Emeree Mahler, RVT 09/17/2021, 10:02 AM

## 2021-09-17 NOTE — Hospital Course (Addendum)
Cody Pena is a 61 y.o. male who was admitted to [facility] under the family medicine teaching service on 2/4 for infected postoperative right transmetatarsal amputation.  Below is his hospital course listed by problem.  Please refer to the H&P for additional information.  Cellulitis of RLE s/p right transmetatarsal amputation on 1/13, concern for osteomyelitis Initial presentation to the ED for chills, subjective fevers, and reported hallucinations at home by his wife and daughter.  Patient had previously completed a 2-week course of doxycycline, but still had erythema, edema, pain and malodorous drainage to his right transmetatarsal amputation.  In the ED, vitals were unremarkable.  Lactic acid and WBC wnl. Patient was started on broad-spectrum antibiotics with vancomycin and cefepime.  Right foot XR demonstrated soft tissue gas adjacent to first metatarsal stump suggesting infection but without any bony abnormalities.  MRI demonstrated bone marrow edema reflecting possible postsurgical changes versus osteomyelitis.  Orthopedics was consulted, recommended non-operative management and continuing antibiotics. Pain was well managed with ibuprofen, Tylenol, and oxycodone. Had adverse reaction to ceftriaxone with rash, itching, and vomiting minutes after initiating infusion. Treated symptomatically with diphenhydramine and ceftriaxone switched back to cefepime. Initial plan to discharge with 2 weeks of cefepime so PICC line was placed. Antibiotics narrowed to TMP-SMX for 7 additional days per ID recommendations. PICC was then removed prior to discharge.  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  Metformin held during admission.  Managed with basal insulin with sliding scale insulin. His gabapentin was continued during hospitalization.  Patient was educated on importance of diabetes control for wound healing. Discharged on his home diabetic  regimen.   Hyperlipidemia   PAD status post stenting of left and right superficial femoral artery ABI performed - right lower extremity within normal range (0.96), left lower extremity with moderate left arterial disease (0.66). Continued on DAPT and statin.   Issues for PCP: Continue to encourage complete tobacco cessation (including vaping)  Aggressive risk-factor modification: HLD, HTN, T2DM  Repeat MRI outpatient 2-3 weeks to determine progression Discharged on 7 additional days of TMP-SMX. Orthopedic f/u appt 2/10.

## 2021-09-17 NOTE — Evaluation (Signed)
Physical Therapy Evaluation & Discharge Patient Details Name: Cody Pena MRN: [id number] DOB: Jul 04, 1961 Today's Date: 09/17/2021  History of Present Illness  61 y/o male presented to ED on 09/16/21 for chills and hallucinations x 1 day. 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 Concern for cellulitis of R LE and osteomyelitis. PMH includes DMII, HTN, PVD.  Clinical Impression  Patient admitted with above findings. Patient currently at modI for mobility with use of RW and good ability to maintain PWB thru heel. Patient is at baseline since last admission with use of RW. No skilled PT needs identified acutely. No PT follow up recommended at this time. PT will sign off.      Recommendations for follow up therapy are one component of a multi-disciplinary discharge planning process, led by the attending physician.  Recommendations may be updated based on patient status, additional functional criteria and insurance authorization.  Follow Up Recommendations No PT follow up    Assistance Recommended at Discharge PRN  Patient can return home with the following  Help with stairs or ramp for entrance;A little help with walking and/or transfers    Equipment Recommendations None recommended by PT  Recommendations for Other Services       Functional Status Assessment Patient has not had a recent decline in their functional status     Precautions / Restrictions Precautions Precautions: Fall Restrictions Weight Bearing Restrictions: Yes LLE Weight Bearing: Partial weight bearing LLE Partial Weight Bearing Percentage or Pounds: through heel Other Position/Activity Restrictions: mobility in post op shoe      Mobility  Bed Mobility Overal bed mobility: Independent                  Transfers Overall transfer level: Modified independent Equipment used: Rolling [name] (2 wheels)                    Ambulation/Gait Ambulation/Gait assistance: Modified independent  (Device/Increase time) Gait Distance (Feet): 20 Feet Assistive device: Rolling [name] (2 wheels) Gait Pattern/deviations: Step-to pattern Gait velocity: decreased     General Gait Details: maintaining PWB thru heel throughout mobility.  patient at baseline since last admission. ModI for mobility  Stairs            Wheelchair Mobility    Modified Rankin (Stroke Patients Only)       Balance Overall balance assessment: Needs assistance Sitting-balance support: No upper extremity supported, Feet supported Sitting balance-[name]: Good     Standing balance support: Bilateral upper extremity supported, Reliant on assistive device for balance Standing balance-[name]: Poor                               Pertinent Vitals/Pain Pain Assessment Pain Assessment: Faces Faces Pain Scale: Hurts little more Pain Location: back Pain Descriptors / Indicators: Grimacing Pain Intervention(s): Monitored during session, Repositioned    Home Living Family/patient expects to be discharged to:: Private residence Living Arrangements: Spouse/significant other;Children Available Help at Discharge: Family;Available 24 hours/day Type of Home: House Home Access: Stairs to enter Entrance Stairs-Rails: None Entrance Stairs-Number of Steps: 4   Home Layout: One level Home Equipment: Agricultural consultant (2 wheels);Cane - single point      Prior Function Prior Level of Function : Independent/Modified Independent             Mobility Comments: used RW for mobility due to WB restrictions       Hand Dominance  Extremity/Trunk Assessment   Upper Extremity Assessment Upper Extremity Assessment: Overall WFL for tasks assessed    Lower Extremity Assessment Lower Extremity Assessment: Overall WFL for tasks assessed    Cervical / Trunk Assessment Cervical / Trunk Assessment: Normal  Communication   Communication: No difficulties  Cognition Arousal/Alertness:  Awake/alert Behavior During Therapy: WFL for tasks assessed/performed Overall Cognitive Status: Within Functional Limits for tasks assessed                                          General Comments      Exercises     Assessment/Plan    PT Assessment Patient does not need any further PT services  PT Problem List Decreased activity tolerance;Decreased balance;Decreased mobility;Decreased knowledge of precautions;Pain       PT Treatment Interventions      PT Goals (Current goals can be found in the Care Plan section)  Acute Rehab PT Goals Patient Stated Goal: to go home PT Goal Formulation: With patient    Frequency       Co-evaluation               AM-PAC PT "6 Clicks" Mobility  Outcome Measure Help needed turning from your back to your side while in a flat bed without using bedrails?: None Help needed moving from lying on your back to sitting on the side of a flat bed without using bedrails?: None Help needed moving to and from a bed to a chair (including a wheelchair)?: None Help needed standing up from a chair using your arms (e.g., wheelchair or bedside chair)?: None Help needed to walk in hospital room?: None Help needed climbing 3-5 steps with a railing? : None 6 Click Score: 24    End of Session   Activity Tolerance: Patient tolerated treatment well Patient left: in chair;with call bell/phone within reach Nurse Communication: Mobility status      Time: 6203-5597 PT Time Calculation (min) (ACUTE ONLY): 18 min   Charges:   PT Evaluation $PT Eval Moderate Complexity: 1 Mod          [name] A. Dan Humphreys PT, DPT Acute Rehabilitation Services Pager [phone] Office [phone]   Viviann Spare 09/17/2021, 11:44 AM

## 2021-09-17 NOTE — Plan of Care (Signed)
°  Problem: Education: Goal: Knowledge of General Education information will improve Description: Including pain rating scale, medication(s)/side effects and non-pharmacologic comfort measures Outcome: Progressing   Problem: Health Behavior/Discharge Planning: Goal: Ability to manage health-related needs will improve Outcome: Progressing   Problem: Clinical Measurements: Goal: Ability to maintain clinical measurements within normal limits will improve Outcome: Progressing Goal: Will remain free from infection Outcome: Not Progressing Goal: Respiratory complications will improve Outcome: Progressing Goal: Cardiovascular complication will be avoided Outcome: Progressing   Problem: Activity: Goal: Risk for activity intolerance will decrease Outcome: Progressing

## 2021-09-18 LAB — BASIC METABOLIC PANEL
Anion gap: 8 (ref 5–15)
BUN: 11 mg/dL (ref 8–23)
CO2: 29 mmol/L (ref 22–32)
Calcium: 8.3 mg/dL — ABNORMAL LOW (ref 8.9–10.3)
Chloride: 100 mmol/L (ref 98–111)
Creatinine, Ser: 0.86 mg/dL (ref 0.61–1.24)
GFR, Estimated: >60 mL/min (ref >60)
Glucose, Bld: 145 mg/dL — ABNORMAL HIGH (ref 70–99)
Potassium: 4.2 mmol/L (ref 3.5–5.1)
Sodium: 137 mmol/L (ref 135–145)

## 2021-09-18 LAB — CBC
HCT: 35.4 % — ABNORMAL LOW (ref 39.0–52.0)
Hemoglobin: 11.7 g/dL — ABNORMAL LOW (ref 13.0–17.0)
MCH: 31.2 pg (ref 26.0–34.0)
MCHC: 33.1 g/dL (ref 30.0–36.0)
MCV: 94.4 fL (ref 80.0–100.0)
Platelets: 159 10*3/uL (ref 150–400)
RBC: 3.75 MIL/uL — ABNORMAL LOW (ref 4.22–5.81)
RDW: 12.3 % (ref 11.5–15.5)
WBC: 7.8 10*3/uL (ref 4.0–10.5)
nRBC: 0 % (ref 0.0–0.2)

## 2021-09-18 LAB — PREALBUMIN: Prealbumin: 12.9 mg/dL — ABNORMAL LOW (ref 18–38)

## 2021-09-18 LAB — GLUCOSE, CAPILLARY
Glucose-Capillary: 148 mg/dL — ABNORMAL HIGH (ref 70–99)
Glucose-Capillary: 165 mg/dL — ABNORMAL HIGH (ref 70–99)
Glucose-Capillary: 135 mg/dL — ABNORMAL HIGH (ref 70–99)
Glucose-Capillary: 189 mg/dL — ABNORMAL HIGH (ref 70–99)

## 2021-09-18 MED ORDER — ADULT MULTIVITAMIN W/MINERALS CH: 1.0000 | ORAL_TABLET | Freq: Every day | ORAL | Status: DC

## 2021-09-18 MED ORDER — SODIUM CHLORIDE 0.9 % IV SOLN
2.0000 g | INTRAVENOUS | Status: DC
  Administered 2021-09-18: 2 g via INTRAVENOUS
  Filled 2021-09-18: qty 20

## 2021-09-18 MED ORDER — CLONAZEPAM 0.25 MG PO TBDP
0.2500 mg | ORAL_TABLET | Freq: Three times a day (TID) | ORAL | Status: DC
  Administered 2021-09-18 – 2021-09-20 (×8): 0.25 mg via ORAL
  Filled 2021-09-18 (×8): qty 1

## 2021-09-18 MED ORDER — SODIUM CHLORIDE 0.9 % IV SOLN
2.0000 g | Freq: Three times a day (TID) | INTRAVENOUS | Status: DC
  Administered 2021-09-18 – 2021-09-20 (×6): 2 g via INTRAVENOUS
  Filled 2021-09-18 (×6): qty 2

## 2021-09-18 MED ORDER — DIPHENHYDRAMINE HCL 50 MG/ML IJ SOLN
25.0000 mg | Freq: Once | INTRAMUSCULAR | Status: AC
  Administered 2021-09-18: 25 mg via INTRAVENOUS
  Filled 2021-09-18: qty 1

## 2021-09-18 NOTE — Progress Notes (Signed)
Pt called RN into the room shortly starting his Rocephin, pt had threw up and was itching all over and was red on his legs and arms. Site around the IV was now red as well. RN paged family medicine pager and spoke with MD and notified them of the change also got an order for Benadryl a they stated they would come and see him.

## 2021-09-18 NOTE — Progress Notes (Signed)
Mobility Specialist Progress Note:   09/18/21 1014  Mobility  Activity Refused mobility   Pt stating he just got up and wants to walk later today. Will follow-up as time allows,  [facility] [place] [place] Mobility Specialist Primary Phone [phone]

## 2021-09-18 NOTE — Progress Notes (Addendum)
FPTS Brief Progress Note  S:Went to assess patient on night rounds. He was resting with visitor at bedside.   Spoke to nursing regarding previous episode of itching and emesis. She reports that he has not had another episode and stopped itching after benadryl was given.   Awoke patient and he reported that he no longer had nausea, vomiting, or a rash.    O: BP 108/68 (BP Location: Right Arm)    Pulse 66    Temp 98.3 F (36.8 C)    Resp 20    Ht 6\' 4"  (1.93 m)    Wt 105.7 kg    SpO2 100%    BMI 28.36 kg/m   General: resting in bed Respiratory: No respiratory distress, normal chest rise and fall  Skin: warm and dry, no rashes noted   A/P: - Nursing is to contact FPTS if patient begins to experience symptoms again throughout the night  - He has had no respiratory compromise and is hemodynamically stable, will monitor VS intermittently as well  -Continue abx plan per day team -Continue cefepime   , MD 09/18/2021, 10:41 PM PGY-1, [facility] Night Resident  Please page [phone] with questions.

## 2021-09-18 NOTE — Progress Notes (Signed)
Family Medicine Teaching Service Daily Progress Note Intern Pager: [phone]  Patient name: Cody Pena Medical record number: [id number] Date of birth: [birth year] Age: 61 y.o. Gender: male  Primary Care Provider: Tamsen Roers, MD Consultants: Orthopedic Surgery Code Status: Full  Pt Overview and Major Events to Date:  2/4- admitted  Assessment and Plan: Cody Pena is a 61 y.o. male presenting with cellulitis of right lower extremity s/p transmetatarsal amputation on 1/13 for chronic diabetic foot ulcer. PMH is significant for PVD, DM, HTN, previous tobacco abuse and depression.   RLE Post-Surgical infection Pt currently on broad spectrum antibiotics ahead of orthopedic surgery. Remains afebrile at this time. WBC wnl at 7.8. ABI preliminary read showing R ABI within normal range and L ABI with moderate LLE arterial disease. Seen by Dr. Sharol Given, ortho, who felt that we could continue medical management with no need for operative intervention at this time. Site is clean with clear drainage at this time. No erythema or streaking extending proximally.   - Will de-escalate abx to ceftriaxone and monitor closely for worsening infection - Tylenol 650mg  q6h - Oxycodone 5-10mg  q4h PRN - PT/OT consulted on admission, PT with no recommended follow-up, may need to be re-consulted if he goes to surgery - Can d/c IVF as pt is eating/drinking - Will obtain prealbumin to assess nutritional status - Multivitamin   T2DM Glucose 145-172 overnight. Only received 2u SAI. Tight control will be crucial to adequate wound healing.  - Continue 15u Semglee - CBGs sSSI - Continue home gabapentin for neuropathy  HLD   PAD - Continue atorvastatin, ASA, Plavix.   Depression - Continue home celexa at decreased dose - Continue home Klonopin  FEN/GI: HH/Carb-modified PPx: Lovenox Dispo:Home in 2-3 days. Barriers include clinical improvement.   Subjective:  [name] reports feeling "great" this  morning. He was seen by Dr. Sharol Given shortly before my interview and was pleased that Dr. Sharol Given does not think he needs urgent surgical intervention. He is able to eat and drink at baseline. Denies fever, chills, SOB.   Objective: Temp:  [97.5 F (36.4 C)-98.2 F (36.8 C)] 97.5 F (36.4 C) (02/06 0214) Pulse Rate:  [64-75] 64 (02/06 0214) Resp:  [17-18] 17 (02/06 0214) BP: (116-122)/(64-70) 116/64 (02/06 0214) SpO2:  [92 %-100 %] 97 % (02/06 0214) Physical Exam: General: Awake, sitting up eating breakfast, NAD Cardiovascular: RRR, no m/r/g Respiratory: Breathing comfortably on RA, lungs clear throughout Abdomen: Soft, non-tender, non-distended Extremities: RLE wound not directly visualized as it had just been re-dressed by ortho surgery, however dressing clean and with scant clear discharge. No erythema or streaking extending proximally.  Laboratory: Recent Labs  Lab 09/16/21 1700 09/17/21 0047 09/18/21 0106  WBC 9.3 8.2 7.8  HGB 12.1* 11.2* 11.7*  HCT 36.3* 34.2* 35.4*  PLT 152 195 159   Recent Labs  Lab 09/16/21 1700 09/17/21 0047 09/18/21 0106  NA 129* 137 137  K 3.8 3.9 4.2  CL 97* 100 100  CO2 24 28 29   BUN 6* 5* 11  CREATININE 0.77 0.76 0.86  CALCIUM 8.3* 8.3* 8.3*  PROT  --  5.7*  --   BILITOT  --  0.4  --   ALKPHOS  --  78  --   ALT  --  10  --   AST  --  10*  --   GLUCOSE 252* 153* 145*    Imaging/Diagnostic Tests: VAS [place] ABI WITH/WO TBI  LOWER EXTREMITY DOPPLER STUDY  Patient Name:  Cody  Elgie Pena  Date of Exam:   09/17/2021 Medical Rec #: [id number]        Accession #:    [id number] Date of Birth: 11-Oct-1960       Patient Gender: M Patient Age:   55 years Exam Location:  [facility] Procedure:      VAS [place] ABI WITH/WO TBI Referring Phys: SARA NEAL  --------------------------------------------------------------------------------   Indications: Ulceration, and peripheral artery disease.  High Risk Factors: Hypertension, hyperlipidemia,  Diabetes, past history of                    smoking, now only vapes.  Other Factors: Right transmet amputation 08/25/21.  Vascular Interventions: 08/11/21 Right SFA and above knee popliteal stenting.  Limitations: Today's exam was limited due to right transmet amputation.  Comparison Study: Prior ABIs done 08/03/21  Performing Technologist: Sharion Dove RVS    Examination Guidelines: A complete evaluation includes at minimum, Doppler waveform signals and systolic blood pressure reading at the level of bilateral brachial, anterior tibial, and posterior tibial arteries, when vessel segments are accessible. Bilateral testing is considered an integral part of a complete examination. Photoelectric Plethysmograph (PPG) waveforms and toe systolic pressure readings are included as required and additional duplex testing as needed. Limited examinations for reoccurring indications may be performed as noted.    ABI Findings: +---------+------------------+-----+---------+-------------------+  Right     Rt Pressure (mmHg) Index Waveform  Comment              +---------+------------------+-----+---------+-------------------+  Brachial  142                      triphasic                      +---------+------------------+-----+---------+-------------------+  PTA       136                0.96  biphasic                       +---------+------------------+-----+---------+-------------------+  DP        126                0.89  biphasic                       +---------+------------------+-----+---------+-------------------+  Great Toe                                    transmet amputation  +---------+------------------+-----+---------+-------------------+  +---------+------------------+-----+---------+-------+  Left      Lt Pressure (mmHg) Index Waveform  Comment  +---------+------------------+-----+---------+-------+  Brachial  130                      triphasic           +---------+------------------+-----+---------+-------+  PTA       94                 0.66  biphasic           +---------+------------------+-----+---------+-------+  DP        59                 0.42  biphasic           +---------+------------------+-----+---------+-------+  Great Toe 45  0.32                     +---------+------------------+-----+---------+-------+  +-------+-----------+-----------+------------+------------+  ABI/TBI Today's ABI Today's TBI Previous ABI Previous TBI  +-------+-----------+-----------+------------+------------+  Right   0.96        amp         0.67         0.40          +-------+-----------+-----------+------------+------------+  Left    0.66        0.32        0.76         0.58          +-------+-----------+-----------+------------+------------+    Right ABIs appear increased compared to prior study on 08/03/21. Left ABIs and TBIs appear decreased compared to prior study on 08/03/21.   Summary: Right: Resting right ankle-brachial index is within normal range. No evidence of significant right lower extremity arterial disease.  Left: Resting left ankle-brachial index indicates moderate left lower extremity arterial disease. The left toe-brachial index is abnormal.    *See table(s) above for measurements and observations.         Preliminary   MR FOOT RIGHT WO CONTRAST CLINICAL DATA:  Fevers and chills.  Status post amputation.  EXAM: MRI OF THE RIGHT FOREFOOT WITHOUT CONTRAST  TECHNIQUE: Multiplanar, multisequence MR imaging of the right forefoot was performed. No intravenous contrast was administered.  COMPARISON:  None.  FINDINGS: Bones/Joint/Cartilage  Transmetatarsal amputation through the metatarsal necks. Bone marrow edema throughout the residual metatarsals which may be reactive secondary to recent amputation versus osteomyelitis. Soft tissue wound overlies the medial aspect of the first metatarsal  stump. Complex fluid collection abutting the fifth metatarsal stump measuring 2.2 x 1.5 x 2 cm which may reflect a postoperative hematoma versus abscess.  Normal alignment. No joint effusion.  Ligaments  Collateral ligaments are intact. Lisfranc ligament is intact. Anterior and posterior talofibular ligaments are intact. Calcaneofibular ligament is intact. Deltoid ligament is intact.  Muscles and Tendons  Moderate tendinosis of the peroneus longus with an interstitial tear. Mild tendinosis of the peroneus brevis. Flexor, extensor and Achilles tendons are intact. Generalized T2 hyperintense signal throughout the plantar musculature likely neurogenic given the patient's history of diabetes.  Soft tissue No other fluid collection or hematoma. No soft tissue mass. Severe soft tissue edema in the lower leg, around the ankle, and extending into the dorsal aspect of the foot as can be seen with cellulitis.  IMPRESSION: 1. Soft tissue wound overlies the medial aspect of the first metatarsal stump. Transmetatarsal amputation through the metatarsal necks. Bone marrow edema throughout the residual metatarsals which may be reactive postsurgical changes secondary to recent amputation versus osteomyelitis. Complex fluid collection abutting the fifth metatarsal stump measuring 2.2 x 1.5 x 2 cm which may reflect a postoperative hematoma versus abscess. 2. Moderate tendinosis of the peroneus longus with an interstitial tear.  Electronically Signed   By: Kathreen Devoid M.D.   On: 09/17/2021 07:50    Eppie Gibson, MD 09/18/2021, 6:31 AM PGY-1, [facility] Intern pager: [phone], text pages welcome

## 2021-09-18 NOTE — Progress Notes (Addendum)
FPTS Interim Progress Note  S: Was paged by RN about patient itching and emesis after ceftriaxone administration.  Went to see patient and he was not in acute distress and felt better after ceftriaxone was stopped.  Denied any respiratory distress, diarrhea, swallowing issue.  Did have erythema on legs and arms that were pruritic.  Said that after ceftriaxone had started went to the bathroom and had large emesis.  Said he started to feel itchy after this and called the nurse after 3 minutes of medication.  O: BP 123/69 (BP Location: Right Arm)    Pulse 60    Temp 97.8 F (36.6 C) (Oral)    Resp 16    Ht 6\' 4"  (1.93 m)    Wt 105.7 kg    SpO2 99%    BMI 28.36 kg/m    General: Well appearing, NAD, awake, alert, responsive to questions CV: Regular rate and rhythm no murmurs rubs or gallops Respiratory: Clear to ausculation bilaterally, no wheezes rales or crackles, chest rises symmetrically,  no increased work of breathing Extremities: erythematous, pruritic, no hives/welts seen Head: flat red patch above eye  A/P: -d/c ceftriaxone, added to allergy list -benadryl 25 mg injection x 1 -monitor respiratory status -advised patient to call nurse if having any acute issues -restart cefepime as he tolerated this well  , MD 09/18/2021, 5:34 PM PGY-1, [facility] Family Medicine Service pager [phone]

## 2021-09-18 NOTE — TOC Progression Note (Signed)
Transition of Care [facility]) - Progression Note    Patient Details  Name: Cody Pena MRN: [id number] Date of Birth: 1961/03/07  Transition of Care [facility]) CM/SW Contact  Milisa Kimbell, Adria Devon, RN Phone Number: 09/18/2021, 10:51 AM  Clinical Narrative:       Transition of Care [facility]) Screening Note   Patient Details  Name: Cody Pena Date of Birth: 1960/08/31   Transition of Care Department [facility]) has reviewed patient and no TOC needs have been identified at this time. We will continue to monitor patient advancement through interdisciplinary progression rounds. If new patient transition needs arise, please place a TOC consult.        Expected Discharge Plan and Services                                                 Social Determinants of Health (SDOH) Interventions    Readmission Risk Interventions No flowsheet data found.

## 2021-09-18 NOTE — Evaluation (Signed)
Occupational Therapy Evaluation and Discharge Patient Details Name: Cody Pena MRN: [id number] DOB: 1960-11-21 Today's Date: 09/18/2021   History of Present Illness 61 y/o male presented to ED on 09/16/21 for chills and hallucinations x 1 day. 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 Concern for cellulitis of R LE and osteomyelitis. PMH includes DMII, HTN, PVD.   Clinical Impression   This 61 yo male admitted with above presents top acute OT at an independent to Mod I level for basic ADLs and mobility. No further OT needs, we will sign off.      Recommendations for follow up therapy are one component of a multi-disciplinary discharge planning process, led by the attending physician.  Recommendations may be updated based on patient status, additional functional criteria and insurance authorization.   Follow Up Recommendations  No OT follow up    Assistance Recommended at Discharge None  Patient can return home with the following Other (comment) (none)    Functional Status Assessment  Patient has had a recent decline in their functional status and demonstrates the ability to make significant improvements in function in a reasonable and predictable amount of time. (without follow up therapy)  Equipment Recommendations  None recommended by OT       Precautions / Restrictions Precautions Precautions: Fall Required Braces or Orthoses: Other Brace (post op shoe) Restrictions Weight Bearing Restrictions: Yes LLE Weight Bearing: Partial weight bearing LLE Partial Weight Bearing Percentage or Pounds: through heel in post op shoe      Mobility Bed Mobility               General bed mobility comments: Pt up in recliner upon arrival    Transfers Overall transfer level: Modified independent                 General transfer comment: pushing IV pole---post op shoe on  Pt ambulated 200 feet Mod I    Balance Overall balance assessment: Mild deficits observed, not  formally tested                                         ADL either performed or assessed with clinical judgement   ADL Overall ADL's : Modified independent                                       General ADL Comments: We discussed pt keeping right foot dry if he attempts bath/shower in tub/shower.     Vision Patient Visual Report: No change from baseline              Pertinent Vitals/Pain Pain Assessment Pain Assessment: No/denies pain     Hand Dominance Right   Extremity/Trunk Assessment Upper Extremity Assessment Upper Extremity Assessment: Overall WFL for tasks assessed           Communication Communication Communication: No difficulties   Cognition Arousal/Alertness: Awake/alert Behavior During Therapy: WFL for tasks assessed/performed Overall Cognitive Status: Within Functional Limits for tasks assessed                                                  Home Living Family/patient expects to be discharged to::  Private residence   Available Help at Discharge: Family;Available 24 hours/day Type of Home: House Home Access: Stairs to enter [employer] of Steps: 4 Entrance Stairs-Rails: None Home Layout: One level     Bathroom Shower/Tub: Tub/shower unit;Walk-in shower   Bathroom Toilet: Standard     Home Equipment: Agricultural consultant (2 wheels);Cane - single point;Tub bench          Prior Functioning/Environment Prior Level of Function : Independent/Modified Independent             Mobility Comments: used RW for mobility due to WB restrictions          OT Problem List: Impaired balance (sitting and/or standing)         OT Goals(Current goals can be found in the care plan section) Acute Rehab OT Goals Patient Stated Goal: to go home         AM-PAC OT "6 Clicks" Daily Activity     Outcome Measure Help from another person eating meals?: None Help from another person taking  care of personal grooming?: None Help from another person toileting, which includes using toliet, bedpan, or urinal?: None Help from another person bathing (including washing, rinsing, drying)?: None Help from another person to put on and taking off regular upper body clothing?: None Help from another person to put on and taking off regular lower body clothing?: None 6 Click Score: 24   End of Session Equipment Utilized During Treatment: Gait belt (pushing IV pole)  Activity Tolerance: Patient tolerated treatment well Patient left: in chair;with call bell/phone within reach  OT Visit Diagnosis: Other abnormalities of gait and mobility (R26.89)                Time: 6734-1937 OT Time Calculation (min): 19 min Charges:  OT General Charges $OT Visit: 1 Visit OT Evaluation $OT Eval Low Complexity: 1 Low  Ignacia Palma, OTR/L Acute [employer] Pager [phone] Office [phone]    Evette Georges 09/18/2021, 10:30 AM

## 2021-09-18 NOTE — Progress Notes (Signed)
Mobility Specialist Progress Note:   09/18/21 1403  Mobility  Bed Position Chair  Activity Ambulated with assistance in hallway  Level of Assistance Independent  Assistive Device Front wheel walker  Distance Ambulated (ft) 570 ft  Activity Response Tolerated well  $Mobility charge 1 Mobility   Pt received in chair willing to participate in mobility. No complaints of pain and asymptomatic. Pt left in chair with call bell in reach and all needs met.   [facility] Public librarian Phone [phone]

## 2021-09-18 NOTE — Progress Notes (Signed)
Patient ID: Cody Pena, male   DOB: 04/26/1961, 61 y.o.   MRN: [id number] Patient is seen for cellulitis and wound dehiscence right transmetatarsal amputation.  Patient is status post revascularization December 30 status post transmetatarsal amputation on January 13.  Recent ankle-brachial indices shows improved circulation in the right lower extremity.  Examination of his foot the cellulitis is resolving he still has some wound dehiscence.  Patient appears to be improving with the IV antibiotics.  I will follow-up during his hospital stay.

## 2021-09-18 NOTE — Progress Notes (Signed)
Mobility Specialist: Progress Note   09/18/21 1713  Mobility  Activity Refused mobility   Pt refused mobility d/t recent N/V episode. Will f/u as able.   [facility] Kinzie Wickes Mobility Specialist Mobility Specialist 5 North: [phone] Mobility Specialist 6 North: [phone]

## 2021-09-18 NOTE — Progress Notes (Signed)
FPTS Brief Progress Note  S: Pt is sleeping, did not wake the pt.    O: BP 120/70 (BP Location: Right Arm)    Pulse 66    Temp (!) 97.5 F (36.4 C) (Oral)    Resp 18    Ht 6\' 4"  (1.93 m)    Wt 105.7 kg    SpO2 99%    BMI 28.36 kg/m   General: Sleeping comfortably  Respiratory: No respiratory distress    A/P:  - Orders reviewed. Labs for AM ordered, which was adjusted as needed.  -Treatment per day team   , MD 09/18/2021, 12:58 AM PGY-1, [facility] Family Medicine Night Resident  Please page [phone] with questions.

## 2021-09-19 ENCOUNTER — Inpatient Hospital Stay: Payer: Self-pay

## 2021-09-19 LAB — CBC
HCT: 33.3 % — ABNORMAL LOW (ref 39.0–52.0)
Hemoglobin: 11 g/dL — ABNORMAL LOW (ref 13.0–17.0)
MCH: 31.3 pg (ref 26.0–34.0)
MCHC: 33 g/dL (ref 30.0–36.0)
MCV: 94.9 fL (ref 80.0–100.0)
Platelets: 220 10*3/uL (ref 150–400)
RBC: 3.51 MIL/uL — ABNORMAL LOW (ref 4.22–5.81)
RDW: 12.5 % (ref 11.5–15.5)
WBC: 7.3 10*3/uL (ref 4.0–10.5)
nRBC: 0 % (ref 0.0–0.2)

## 2021-09-19 LAB — BASIC METABOLIC PANEL
Anion gap: 8 (ref 5–15)
BUN: 12 mg/dL (ref 8–23)
CO2: 27 mmol/L (ref 22–32)
Calcium: 8.3 mg/dL — ABNORMAL LOW (ref 8.9–10.3)
Chloride: 103 mmol/L (ref 98–111)
Creatinine, Ser: 0.83 mg/dL (ref 0.61–1.24)
GFR, Estimated: >60 mL/min (ref >60)
Glucose, Bld: 210 mg/dL — ABNORMAL HIGH (ref 70–99)
Potassium: 4.1 mmol/L (ref 3.5–5.1)
Sodium: 138 mmol/L (ref 135–145)

## 2021-09-19 LAB — GLUCOSE, CAPILLARY
Glucose-Capillary: 209 mg/dL — ABNORMAL HIGH (ref 70–99)
Glucose-Capillary: 125 mg/dL — ABNORMAL HIGH (ref 70–99)
Glucose-Capillary: 138 mg/dL — ABNORMAL HIGH (ref 70–99)
Glucose-Capillary: 184 mg/dL — ABNORMAL HIGH (ref 70–99)

## 2021-09-19 MED ORDER — SODIUM CHLORIDE 0.9% FLUSH: 10.0000 mL | INTRAVENOUS | Status: DC | PRN

## 2021-09-19 MED ORDER — SODIUM CHLORIDE 0.9% FLUSH
10.0000 mL | Freq: Two times a day (BID) | INTRAVENOUS | Status: DC
  Administered 2021-09-19 – 2021-09-20 (×2): 10 mL

## 2021-09-19 MED ORDER — CHLORHEXIDINE GLUCONATE CLOTH 2 % EX PADS
6.0000 | MEDICATED_PAD | Freq: Every day | CUTANEOUS | Status: DC
  Administered 2021-09-19 – 2021-09-20 (×2): 6 via TOPICAL

## 2021-09-19 MED ORDER — INSULIN GLARGINE-YFGN 100 UNIT/ML ~~LOC~~ SOLN
18.0000 [IU] | Freq: Every day | SUBCUTANEOUS | Status: DC
  Administered 2021-09-19: 18 [IU] via SUBCUTANEOUS
  Filled 2021-09-19 (×2): qty 0.18

## 2021-09-19 NOTE — Progress Notes (Signed)
Peripherally Inserted Central Catheter Placement  The IV Nurse has discussed with the patient and/or persons authorized to consent for the patient, the purpose of this procedure and the potential benefits and risks involved with this procedure.  The benefits include less needle sticks, lab draws from the catheter, and the patient may be discharged home with the catheter. Risks include, but not limited to, infection, bleeding, blood clot (thrombus formation), and puncture of an artery; nerve damage and irregular heartbeat and possibility to perform a PICC exchange if needed/ordered by physician.  Alternatives to this procedure were also discussed.  Bard Power PICC patient education guide, fact sheet on infection prevention and patient information card has been provided to patient /or left at bedside.    PICC Placement Documentation  PICC Single Lumen 09/19/21 Right Brachial 40 cm 1 cm (Active)  Indication for Insertion or Continuance of Line Home intravenous therapies (PICC only) 09/19/21 1800  Exposed Catheter (cm) 0 cm 09/19/21 1800  Site Assessment Clean;Dry;Intact 09/19/21 1800  Line Status Flushed;Blood return noted 09/19/21 1800  Dressing Type Transparent 09/19/21 1800  Dressing Status Clean;Dry;Intact 09/19/21 1800  Antimicrobial disc in place? Yes 09/19/21 1800  Dressing Change Due 09/26/21 09/19/21 1800       Stacie Glaze Horton 09/19/2021, 6:31 PM

## 2021-09-19 NOTE — Progress Notes (Signed)
Mobility Specialist Progress Note:   09/19/21 1159  Mobility  Activity Ambulated with assistance in hallway  Level of Assistance Independent after set-up  Assistive Device None  Distance Ambulated (ft) 570 ft  Activity Response Tolerated well  $Mobility charge 1 Mobility   Pt received in bed willing to participate in mobility. No complaints of pain. Pt Left in chair with call bell in reach and all needs met.   [facility] Public librarian Phone [phone]

## 2021-09-19 NOTE — Progress Notes (Signed)
Family Medicine Teaching Service Daily Progress Note Intern Pager: [phone]  Patient name: Cody Pena Medical record number: [id number] Date of birth: 02/23/1961 Age: 61 y.o. Gender: male  Primary Care Provider: Aida Puffer, MD Consultants: Orthopedic surgery Code Status: Full  Pt Overview and Major Events to Date:  2/4- admitted  Assessment and Plan: Cody Pena is a 61 y.o. male presenting with cellulitis of right lower extremity s/p transmetatarsal amputation on 1/13 for chronic diabetic foot ulcer. PMH is significant for PVD, DM, HTN, previous tobacco abuse and depression.   RLE post-surgical infection Patient remains afebrile and without an elevated WBC. Blood cx with NGTD. Wound culture in December grew Strep agalactiae. Had an apparent allergic reaction with itching and emesis to CTX administration last night that improved with discontinuation of the medication. Added to allergy list. Patient has previously failed therapy with doxycycline and augmentin. He has a history of requiring PICC for home antibiotics for non-healing wound on his L leg in 2018. Prealbumin low at 12.9 suggesting an element of malnutrition may be contributing to delayed wound healing. Wound is dehisced but no evidence of worsening or spreading infection.  - Cefepime IV, may consider PICC vs transition to Bactrim - Tylenol and oxycodone for pain - PT signed off - Multivitamin  T2DM Glucose overnight 189-210. Received 12u SAI in past 24 hours. - 15u Semglee > increase to 18u - CBGs, sSSI - Continue home gabapentin for neuropathy   HLD   PAD - Continue atorvastatin, ASA, Plavix  Depression - Continue home Celexa at decreased dose - Continue home Klonopin  FEN/GI: HH/Carb-Modified PPx: Lovenox Dispo:Home tomorrow. Barriers include determining adequate home abx regimen.   Subjective:  Cody Pena feels generally well today. No fever or chills. We discussed options for antibiotic coverage for  home and he shares that he did have PICC at home in 2018 for a non-healing wound on his other foot.   Objective: Temp:  [97.4 F (36.3 C)-98.3 F (36.8 C)] 97.4 F (36.3 C) (02/07 0818) Pulse Rate:  [58-66] 66 (02/07 0818) Resp:  [8-20] 8 (02/07 0818) BP: (108-137)/(68-74) 137/74 (02/07 0818) SpO2:  [97 %-100 %] 99 % (02/07 0818) Physical Exam: General: Awake and alert, sitting up at side of bed Cardiovascular: Regular rate, regular rhythm, no murmur, rub, or gallop Respiratory: Normal WOB on RA, lungs CTAB Abdomen: Soft, non-tender, non-distended Extremities: RLE wound with bloodied dressing, no erythema or streaking extending proximally. Decreased generalized limb edema compared to my exam yesterday  Laboratory: Recent Labs  Lab 09/17/21 0047 09/18/21 0106 09/19/21 0251  WBC 8.2 7.8 7.3  HGB 11.2* 11.7* 11.0*  HCT 34.2* 35.4* 33.3*  PLT 195 159 220   Recent Labs  Lab 09/17/21 0047 09/18/21 0106 09/19/21 0251  NA 137 137 138  K 3.9 4.2 4.1  CL 100 100 103  CO2 28 29 27   BUN 5* 11 12  CREATININE 0.76 0.86 0.83  CALCIUM 8.3* 8.3* 8.3*  PROT 5.7*  --   --   BILITOT 0.4  --   --   ALKPHOS 78  --   --   ALT 10  --   --   AST 10*  --   --   GLUCOSE 153* 145* 210*     Imaging/Diagnostic Tests: No new imaging, tests  , MD 09/19/2021, 1:41 PM PGY-1, [facility] Family Medicine FPTS Intern pager: [phone], text pages welcome

## 2021-09-20 LAB — CBC
HCT: 33.1 % — ABNORMAL LOW (ref 39.0–52.0)
Hemoglobin: 10.9 g/dL — ABNORMAL LOW (ref 13.0–17.0)
MCH: 30.8 pg (ref 26.0–34.0)
MCHC: 32.9 g/dL (ref 30.0–36.0)
MCV: 93.5 fL (ref 80.0–100.0)
Platelets: 222 10*3/uL (ref 150–400)
RBC: 3.54 MIL/uL — ABNORMAL LOW (ref 4.22–5.81)
RDW: 12.3 % (ref 11.5–15.5)
WBC: 6.9 10*3/uL (ref 4.0–10.5)
nRBC: 0 % (ref 0.0–0.2)

## 2021-09-20 LAB — BASIC METABOLIC PANEL
Anion gap: 7 (ref 5–15)
BUN: 10 mg/dL (ref 8–23)
CO2: 29 mmol/L (ref 22–32)
Calcium: 8.6 mg/dL — ABNORMAL LOW (ref 8.9–10.3)
Chloride: 103 mmol/L (ref 98–111)
Creatinine, Ser: 0.74 mg/dL (ref 0.61–1.24)
GFR, Estimated: >60 mL/min (ref >60)
Glucose, Bld: 101 mg/dL — ABNORMAL HIGH (ref 70–99)
Potassium: 4.2 mmol/L (ref 3.5–5.1)
Sodium: 139 mmol/L (ref 135–145)

## 2021-09-20 LAB — GLUCOSE, CAPILLARY
Glucose-Capillary: 122 mg/dL — ABNORMAL HIGH (ref 70–99)
Glucose-Capillary: 130 mg/dL — ABNORMAL HIGH (ref 70–99)
Glucose-Capillary: 105 mg/dL — ABNORMAL HIGH (ref 70–99)

## 2021-09-20 MED ORDER — AMOXICILLIN-POT CLAVULANATE 875-125 MG PO TABS: 1.0000 | ORAL_TABLET | Freq: Two times a day (BID) | ORAL | 0 refills | Status: DC

## 2021-09-20 MED ORDER — CITALOPRAM HYDROBROMIDE 20 MG PO TABS: 20.0000 mg | ORAL_TABLET | Freq: Every day | ORAL | 0 refills | Status: DC

## 2021-09-20 MED ORDER — POLYETHYLENE GLYCOL 3350 17 G PO PACK: 17.0000 g | PACK | Freq: Every day | ORAL | 0 refills | Status: DC | PRN

## 2021-09-20 NOTE — Discharge Instructions (Addendum)
Dear Cody Pena,   Thank you so much for allowing [place] to be part of your care!  You were admitted to [facility] for a foot infection.  We expect this is from an infection and changes after surgery which improved after antibiotics and monitoring.  We are so glad you are feeling better.  Be sure to follow-up with your regularly scheduled appointments.  . Please make sure to follow up with Dr. Lajoyce Corners outpatient and take all medications as prescribed. You will need to take Augmentin twice a day for 7 days starting tonight. Thank you for allowing [place] to be a part of your medical care.   POST-HOSPITAL & CARE INSTRUCTIONS Take antibiotics as prescribed. Please let PCP/Specialists know of any changes that were made.  Please see medications section of this packet for any medication changes.   DOCTOR'S APPOINTMENT & FOLLOW UP CARE INSTRUCTIONS  Future Appointments  Date Time Provider Department Center  09/22/2021  9:30 AM Adonis Huguenin, NP [facility] None  09/28/2021 10:05 AM Levin Erp, MD [facility]    RETURN PRECAUTIONS: Please call your doctor or go to the emergency room if you develop fevers or signs of worsening infection such as increased redness or drainage.  Take care and be well!  Family Medicine Teaching Service  [place]  [facility]  [address] [place], [place] [address] [phone]

## 2021-09-20 NOTE — Progress Notes (Signed)
Patient ID: HRIDAY STAI, male   DOB: 1961/05/12, 61 y.o.   MRN: [id number] Patient's transmetatarsal amputation wound is 10 cm in length and 1 cm in width with wound dehiscence.  Patient will require dry dressing change daily at home with 4 x 4 gauze and an Ace wrap change daily after cleansing with soap and water.

## 2021-09-20 NOTE — Progress Notes (Signed)
FPTS Brief Progress Note  S: Pt is doing well tonight with no complaints.    O: BP 116/68 (BP Location: Left Arm)    Pulse 65    Temp 97.7 F (36.5 C) (Oral)    Resp 20    Ht 6\' 4"  (1.93 m)    Wt 105.7 kg    SpO2 98%    BMI 28.36 kg/m   General: NAD, pleasant, able to participate in exam Respiratory: No respiratory distress Skin: warm and dry, no rashes noted Psych: Normal affect and mood  A/P: -Continue with plan per day team - Orders reviewed. Labs for AM ordered, which was adjusted as needed.   , MD 09/20/2021, 2:15 AM PGY-1, [facility] Family Medicine Night Resident  Please page [phone] with questions.

## 2021-09-20 NOTE — Consult Note (Signed)
[facility]       Reason for Consult: wound dehiscence    Referring Physician: Dr. Jennette Kettle  Principal Problem:   Wound infection after surgery Active Problems:   Poorly controlled type 2 diabetes mellitus (HCC)    acetaminophen  650 mg Oral Q6H   Or   acetaminophen  650 mg Rectal Q6H   aspirin EC  81 mg Oral Daily   atorvastatin  80 mg Oral Daily   Chlorhexidine Gluconate Cloth  6 each Topical Daily   citalopram  20 mg Oral Daily   clonazePAM  0.25 mg Oral TID   clopidogrel  75 mg Oral Daily   enoxaparin (LOVENOX) injection  40 mg Subcutaneous Q24H   gabapentin  600 mg Oral TID   ibuprofen  400 mg Oral Q6H   insulin aspart  0-15 Units Subcutaneous TID WC   insulin aspart  0-5 Units Subcutaneous QHS   insulin glargine-yfgn  18 Units Subcutaneous QHS   multivitamin with minerals  1 tablet Oral Daily   nicotine  21 mg Transdermal Daily   sodium chloride flush  10-40 mL Intracatheter Q12H    Recommendations:  Augmentin 875mg  orally twice a day for 7 more days  Assessment: He has cellulitis that is improving on treatment and a history of Streptococcus in wound culture.  MRI also done and expected findings of post surgical changes and and some fluid.  Cellulitis noted clinically.   No indication for IV antibiotics.  No OPAT indicated for outpatient management.   If IV antibiotics desired by the primary team, will not be managed by the outpatient parenteral antimicrobial team.    Thanks for consultation, call with any questions. will sign off  Antibiotics: cefepime  HPI: Cody Pena is a 61 y.o. male with a history of poorly-controlled diabetes with Hgb A1c of 12.5 and recent history of transmetatarsal amputation here with wound dehiscence and cellulitis.  He underwent amputation for chronic diabetic foot ulcer on 08/25/21 by Dr. 08/27/21 and in follow up  noted some cellulitis and placed on doxycycline.  The patient reports improvement of the  cellulitis after 2 weeks but as soon as the doxycycline was stopped, the redness reappeared.  He came in on 09/16/21 with the erythema and drainage described as clear, red fluid, no pus noted.  He developed subjective fever, chills and some hallucinations.  He has a history of PAD and underwent revascularization on December 30th.  He has remained afebrile and WBC wnl.    Review of Systems:  Constitutional: negative for fevers and chills Gastrointestinal: negative for nausea and diarrhea Integument/breast: negative for rash All other systems reviewed and are negative    Past Medical History:  Diagnosis Date   Diabetes mellitus without complication (HCC)    Hypertension    Peripheral vascular disease (HCC)     Social History   Tobacco Use   Smoking status: Every Day    Years: 47.00    Types: Cigarettes    Last attempt to quit: 08/11/2021    Years since quitting: 0.1   Smokeless tobacco: Never  Vaping Use   Vaping Use: Some days  Substance Use Topics   Alcohol use: No   Drug use: No    FMH: + cardiac disease, DM  Allergies  Allergen Reactions   Ceftriaxone Itching, Nausea And Vomiting and Rash    Noted minutes after starting medication    Physical Exam: Constitutional: in no apparent distress  Vitals:  09/20/21 0458 09/20/21 0715  BP: 117/71 134/86  Pulse: (!) 57 73  Resp: 20 18  Temp: 97.7 F (36.5 C)   SpO2: 97% 98%   EYES: anicteric Cardiovascular: Cor RRR Respiratory: clear; GI: soft Musculoskeletal: right foot wrapped Skin: no rashes Neuro: non-focal  Lab Results  Component Value Date   WBC 6.9 09/20/2021   HGB 10.9 (L) 09/20/2021   HCT 33.1 (L) 09/20/2021   MCV 93.5 09/20/2021   PLT 222 09/20/2021    Lab Results  Component Value Date   CREATININE 0.74 09/20/2021   BUN 10 09/20/2021   NA 139 09/20/2021   K 4.2 09/20/2021   CL 103 09/20/2021   CO2 29 09/20/2021    Lab Results  Component Value Date   ALT 10 09/17/2021   AST 10 (L)  09/17/2021   ALKPHOS 78 09/17/2021     Microbiology: Recent Results (from the past 240 hour(s))  Resp Panel by RT-PCR (Flu A&B, Covid) Nasopharyngeal Swab     Status: None   Collection Time: 09/16/21  5:02 PM   Specimen: Nasopharyngeal Swab; Nasopharyngeal(NP) swabs in vial transport medium  Result Value Ref Range Status   SARS Coronavirus 2 by RT PCR NEGATIVE NEGATIVE Final    Comment: (NOTE) SARS-CoV-2 target nucleic acids are NOT DETECTED.  The SARS-CoV-2 RNA is generally detectable in upper respiratory specimens during the acute phase of infection. The lowest concentration of SARS-CoV-2 viral copies this assay can detect is 138 copies/mL. A negative result does not preclude SARS-Cov-2 infection and should not be used as the sole basis for treatment or other patient management decisions. A negative result may occur with  improper specimen collection/handling, submission of specimen other than nasopharyngeal swab, presence of viral mutation(s) within the areas targeted by this assay, and inadequate number of viral copies(<138 copies/mL). A negative result must be combined with clinical observations, patient history, and epidemiological information. The expected result is Negative.  Fact Sheet for Patients:  BloggerCourse.com  Fact Sheet for Healthcare Providers:  SeriousBroker.it  This test is no t yet approved or cleared by the [place] FDA and  has been authorized for detection and/or diagnosis of SARS-CoV-2 by FDA under an Emergency Use Authorization (EUA). This EUA will remain  in effect (meaning this test can be used) for the duration of the COVID-19 declaration under Section 564(b)(1) of the Act, 21 U.S.C.section 360bbb-3(b)(1), unless the authorization is terminated  or revoked sooner.       Influenza A by PCR NEGATIVE NEGATIVE Final   Influenza B by PCR NEGATIVE NEGATIVE Final    Comment: (NOTE) The Xpert  Xpress SARS-CoV-2/FLU/RSV plus assay is intended as an aid in the diagnosis of influenza from Nasopharyngeal swab specimens and should not be used as a sole basis for treatment. Nasal washings and aspirates are unacceptable for Xpert Xpress SARS-CoV-2/FLU/RSV testing.  Fact Sheet for Patients: BloggerCourse.com  Fact Sheet for Healthcare Providers: SeriousBroker.it  This test is not yet approved or cleared by the [place] FDA and has been authorized for detection and/or diagnosis of SARS-CoV-2 by FDA under an Emergency Use Authorization (EUA). This EUA will remain in effect (meaning this test can be used) for the duration of the COVID-19 declaration under Section 564(b)(1) of the Act, 21 U.S.C. section 360bbb-3(b)(1), unless the authorization is terminated or revoked.  Performed at [facility], [address] [place], [place] [address]   Culture, blood (routine x 2)     Status: None (Preliminary result)  Collection Time: 09/16/21  5:20 PM   Specimen: BLOOD  Result Value Ref Range Status   Specimen Description BLOOD RIGHT ANTECUBITAL  Final   Special Requests   Final    BOTTLES DRAWN AEROBIC AND ANAEROBIC Blood Culture adequate volume   Culture   Final    NO GROWTH 4 DAYS Performed at [facility], [address] [place], [place] [address]    Report Status PENDING  Incomplete    Gardiner Barefoot, MD [facility] [email or web address] 09/20/2021, 2:01 PM

## 2021-09-20 NOTE — Discharge Summary (Signed)
[facility] Discharge Summary  Patient name: Cody Pena Medical record number: [id number] Date of birth: Jun 18, 1961 Age: 62 y.o. Gender: male Date of Admission: 09/16/2021  Date of Discharge: 09/20/2021  Admitting Physician: Sharion Settler, DO  Primary Care Provider: Tamsen Roers, MD Consultants: orthopedics  Indication for Hospitalization: Cellulitis of the right lower extremity  Discharge Diagnoses/Problem List:  Principal Problem:   Wound infection after surgery Active Problems:   Poorly controlled type 2 diabetes mellitus ([place]) PAD HTN Diabetic neuropathy  Disposition: home  Discharge Condition: Stable  Discharge Exam:  Exam per Dr. Janus Molder General: Appears well, no acute distress. Age appropriate. Cardiac: RRR, normal heart sounds, no murmurs Respiratory: CTAB, normal effort Abdomen: soft, nontender, nondistended Extremities: Right foot with clean dressing and sock.  No lower extremity edema. Skin: Warm and dry, no rashes noted Neuro: alert and oriented Psych: normal affect   Brief Hospital Course:  Cody Pena is a 61 y.o. male who was admitted to [facility] under the family medicine teaching service on 2/4 for infected postoperative right transmetatarsal amputation.  Below is his hospital course listed by problem.  Please refer to the H&P for additional information.  Cellulitis of RLE s/p right transmetatarsal amputation on 1/13, concern for osteomyelitis Initial presentation to the ED for chills, subjective fevers, and reported hallucinations at home by his wife and daughter.  Patient had previously completed a 2-week course of doxycycline, but still had erythema, edema, pain and malodorous drainage to his right transmetatarsal amputation.  In the ED, vitals were unremarkable.  Lactic acid and WBC wnl. Patient was started on broad-spectrum antibiotics with vancomycin and cefepime.  Right foot XR demonstrated soft  tissue gas adjacent to first metatarsal stump suggesting infection but without any bony abnormalities.  MRI demonstrated bone marrow edema reflecting possible postsurgical changes versus osteomyelitis.  Orthopedics was consulted, recommended non-operative management and continuing antibiotics. Pain was well managed with ibuprofen, Tylenol, and oxycodone. Had adverse reaction to ceftriaxone with rash, itching, and vomiting minutes after initiating infusion. Treated symptomatically with diphenhydramine and ceftriaxone switched back to cefepime. Initial plan to discharge with 2 weeks of cefepime so PICC line was placed. Antibiotics narrowed to TMP-SMX for 7 additional days per ID recommendations. PICC was then removed prior to discharge.  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  Metformin held during admission.  Managed with basal insulin with sliding scale insulin. His gabapentin was continued during hospitalization.  Patient was educated on importance of diabetes control for wound healing. Discharged on his home diabetic regimen.   Hyperlipidemia   PAD status post stenting of left and right superficial femoral artery ABI performed - right lower extremity within normal range (0.96), left lower extremity with moderate left arterial disease (0.66). Continued on DAPT and statin.   Issues for PCP: Continue to encourage complete tobacco cessation (including vaping)  Aggressive risk-factor modification: HLD, HTN, T2DM  Repeat MRI outpatient 2-3 weeks to determine progression Discharged on 7 additional days of TMP-SMX. Orthopedic f/u appt 2/10.   Significant Procedures: none  Significant Labs and Imaging:  Recent Labs  Lab 09/18/21 0106 09/19/21 0251 09/20/21 0405  WBC 7.8 7.3 6.9  HGB 11.7* 11.0* 10.9*  HCT 35.4* 33.3* 33.1*  PLT 159 220 222   Recent Labs  Lab 09/16/21 1700 09/17/21 0047 09/18/21 0106 09/19/21 0251 09/20/21 0405  NA  129* 137 137 138 139  K 3.8 3.9 4.2 4.1 4.2  CL 97*  100 100 103 103  CO2 _0 GLUCOSE 252* 153* 145* 210* 101*  BUN 6* 5* _1 CREATININE 0.77 0.76 0.86 0.83 0.74  CALCIUM 8.3* 8.3* 8.3* 8.3* 8.6*  ALKPHOS  --  78  --   --   --   AST  --  10*  --   --   --   ALT  --  10  --   --   --   ALBUMIN  --  2.5*  --   --   --       Results/Tests Pending at Time of Discharge: none  Discharge Medications:  Allergies as of 09/20/2021       Reactions   Ceftriaxone Itching, Nausea And Vomiting, Rash   Noted minutes after starting medication        Medication List     STOP taking these medications    doxycycline 100 MG tablet Commonly known as: VIBRA-TABS   loperamide 2 MG capsule Commonly known as: IMODIUM       TAKE these medications    Accu-Chek Guide w/Device Kit Use up to four times daily as directed.   Accu-Chek Softclix Lancets lancets Use up to four times daily as directed.   albuterol 108 (90 Base) MCG/ACT inhaler Commonly known as: VENTOLIN HFA Inhale 2 puffs into the lungs every 6 (six) hours as needed for shortness of breath.   amoxicillin-clavulanate 875-125 MG tablet Commonly known as: Augmentin Take 1 tablet by mouth 2 (two) times daily for 7 days.   aspirin 81 MG EC tablet Take 1 tablet (81 mg total) by mouth daily. Swallow whole.   atorvastatin 80 MG tablet Commonly known as: LIPITOR Take 1 tablet (80 mg total) by mouth daily. What changed: when to take this   Basaglar KwikPen 100 UNIT/ML Inject 24 Units into the skin at bedtime.   citalopram 20 MG tablet Commonly known as: CELEXA Take 1 tablet (20 mg total) by mouth daily. Start taking on: September 21, 2021 What changed:  medication strength how much to take   clonazePAM 0.5 MG tablet Commonly known as: KLONOPIN Take 0.5 mg by mouth 3 (three) times daily.   clopidogrel 75 MG tablet Commonly known as: PLAVIX Take 1 tablet (75 mg total) by mouth daily.   gabapentin 600  MG tablet Commonly known as: NEURONTIN Take 1 tablet (600 mg total) by mouth 3 (three) times daily.   glucose blood test strip Use as directed up to four times daily.   ibuprofen 200 MG tablet Commonly known as: ADVIL Take 800 mg by mouth every 6 (six) hours as needed for moderate pain or headache.   insulin lispro 100 UNIT/ML KwikPen Commonly known as: HUMALOG use 3 times daily with meals BG<70-120 0unit; BG121-150 2unit; BG151-200 3unit; BG 201-250 5unit; BG251-300 8unit; BG301-350 11unit; BG351-400 15 units BG>400call MD   Insulin Pen Needle 32G X 4 MM Misc Use as directed with insulin pens.   metFORMIN 500 MG tablet Commonly known as: Glucophage Take 1 tablet (500 mg total) by mouth 2 (two) times daily with a meal.   multivitamin with minerals Tabs tablet Take 1 tablet by mouth daily.   nicotine 21 mg/24hr patch Commonly known as: NICODERM CQ - dosed in mg/24 hours Place 1 patch (21 mg total) onto the skin daily.   oxyCODONE-acetaminophen 10-325 MG tablet Commonly known as: PERCOCET Take 1 tablet by mouth every 4 (four) hours as needed for pain.  polyethylene glycol 17 g packet Commonly known as: MIRALAX / GLYCOLAX Take 17 g by mouth daily as needed for mild constipation.   Vitamin D (Ergocalciferol) 1.25 MG (50000 UNIT) Caps capsule Commonly known as: DRISDOL Take 1 capsule (50,000 Units total) by mouth every 7 (seven) days. What changed: when to take this               Durable Medical Equipment  (From admission, onward)           Start     Ordered   09/20/21 1348  For home use only DME Other see comment  Once       Comments: 4 x 4 gauze and ACE wrap  Question:  Length of Need  Answer:  6 Months   09/20/21 1349            Discharge Instructions: Please refer to Patient Instructions section of EMR for full details.  Patient was counseled important signs and symptoms that should prompt return to medical care, changes in medications, dietary  instructions, activity restrictions, and follow up appointments.   Follow-Up Appointments:  Follow-up Information     Little, Jeneen Rinks, MD. Schedule an appointment as soon as possible for a visit.   Specialty: Family Medicine Why: Please follow up at your earliest convenience for a hospital follow up. Contact information: [address] [place] [address] [phone]         Newt Minion, MD. Schedule an appointment as soon as possible for a visit.   Specialty: Orthopedic Surgery Contact information: [place] [address] [phone]         [facility] [employer] Follow up.   Why: wound supplies ordered with this company , Contact information: [place] [address] [phone]         [facility]. Go on 09/28/2021.   Specialty: Family Medicine Why: _0 :05 (Please arrive 15 minutes earlier) Contact information: [address] [id number] [place]                Zola Button, MD 09/20/2021, 5:10 PM PGY-2, [place]

## 2021-09-20 NOTE — Progress Notes (Signed)
Discheaged home with instructions and some dressing supplies.

## 2021-09-20 NOTE — Progress Notes (Signed)
Mobility Specialist Progress Note:   09/20/21 1110  Mobility  Bed Position Chair  Activity Ambulated with assistance in hallway  Level of Assistance Independent after set-up  Assistive Device None  Distance Ambulated (ft) 570 ft  Activity Response Tolerated well  $Mobility charge 1 Mobility   Pt received in bed willing to participate in mobility. No complaints of pain and asymptomatic. Pt left in chair with call bell in reach and all needs met.   [facility] Public librarian Phone [phone]

## 2021-09-20 NOTE — Progress Notes (Signed)
Family Medicine Teaching Service Daily Progress Note Intern Pager: [phone]  Patient name: Cody Pena Medical record number: [id number] Date of birth: June 15, 1961 Age: 61 y.o. Gender: male  Primary Care Provider: Aida Puffer, MD Consultants: Orthopedic surgery Code Status: Full  Pt Overview and Major Events to Date:  2/4- admitted  Assessment and Plan: Cody Pena is a 61 y.o. male presenting with cellulitis of right lower extremity s/p transmetatarsal amputation on 1/13 for chronic diabetic foot ulcer. PMH is significant for PVD, DM, HTN, previous tobacco abuse and depression.    RLE post-surgical infection Patient remains afebrile and without an elevated WBC. Blood cx with NGTD. Wound culture in December grew Strep agalactiae. Had an apparent allergic reaction with itching and emesis to CTX administration during this hospitalization.   Previously failed therapy with doxycycline and augmentin. He has a history of requiring PICC for home antibiotics for non-healing wound on his L leg in 2018. Prealbumin low at 12.9 suggesting an element of malnutrition may be contributing to delayed wound healing.  Wound is covered with clean dressing and sock today previously reviewed yesterday.  PICC line placed overnight. -Continue cefepime IV for additional 2 weeks; plan is to continue IV cefepime outpatient with PICC line - Consider ID consult pending OPAT orders - Dressing needs to nonstick pads and Kerlix - Tylenol and oxycodone for pain - PT signed off - Multivitamin   T2DM CBG 122 this morning. Received 9u SAI in past 24 hours. -Continue 18u of Semglee - CBGs, sSSI - Continue home gabapentin for neuropathy   HLD   PAD - Continue atorvastatin, ASA, Plavix   Depression - Continue home Celexa at decreased dose - Continue home Klonopin   FEN/GI: HH/Carb-Modified PPx: Lovenox Dispo:Home today or tomorrow. Barriers include OPAT orders.  Subjective:  Doing well no concerns at  this time.  Ready to go home.  Objective: Temp:  [97.4 F (36.3 C)-98.1 F (36.7 C)] 97.7 F (36.5 C) (02/08 0458) Pulse Rate:  [57-73] 73 (02/08 0715) Resp:  [8-20] 18 (02/08 0715) BP: (116-137)/(68-86) 134/86 (02/08 0715) SpO2:  [97 %-100 %] 98 % (02/08 0715)  Physical Exam: General: Appears well, no acute distress. Age appropriate. Cardiac: RRR, normal heart sounds, no murmurs Respiratory: CTAB, normal effort Abdomen: soft, nontender, nondistended Extremities: Right foot with clean dressing and sock.  No lower extremity edema. Skin: Warm and dry, no rashes noted Neuro: alert and oriented Psych: normal affect   Laboratory: Recent Labs  Lab 09/18/21 0106 09/19/21 0251 09/20/21 0405  WBC 7.8 7.3 6.9  HGB 11.7* 11.0* 10.9*  HCT 35.4* 33.3* 33.1*  PLT 159 220 222   Recent Labs  Lab 09/17/21 0047 09/18/21 0106 09/19/21 0251 09/20/21 0405  NA 137 137 138 139  K 3.9 4.2 4.1 4.2  CL 100 100 103 103  CO2 28 29 27 29   BUN 5* 11 12 10   CREATININE 0.76 0.86 0.83 0.74  CALCIUM 8.3* 8.3* 8.3* 8.6*  PROT 5.7*  --   --   --   BILITOT 0.4  --   --   --   ALKPHOS 78  --   --   --   ALT 10  --   --   --   AST 10*  --   --   --   GLUCOSE 153* 145* 210* 101*     Imaging/Diagnostic Tests: No new imaging  , DO 09/20/2021, 7:18 AM PGY-3, [place] [facility] FPTS Intern pager: [phone], text  pages welcome

## 2021-09-20 NOTE — TOC Initial Note (Addendum)
Transition of Care [facility]) - Initial/Assessment Note    Patient Details  Name: Cody Pena MRN: [id number] Date of Birth: 1960/11/17  Transition [facility] CM/SW Contact:    Kingsley Plan, RN Phone Number: 09/20/2021, 11:10 AM  Clinical Narrative:                 Spoke to patient at bedside. Confirmed face sheet information.   Plan to discharge to home with IV ABX. Explained to patient that [employer] will be infusion company. He will have a home health nurse through [facility] for PICC line care only. [facility] will not assist with dressing changes. PAtient voiced understanding, he states he has been doing dressing changes. Patient would like NCM to order dressing supplies. NCM asked Dr Wynelle Link and bedside nurse for a list of supplies. NCM does not see orders in Epic for dressing changes. Bedside nurse will also give some supplies at discharge.   Also, Pam with [facility] will provide patient and wife with teaching on IV ABX at home, [facility] nurse will not be present every time a dose is due. Patient voiced understanding.  NCM asked MD for OPAT script also. Pam will need script to get insurance authorization. Will also need to know which doctor will be following IV ABX after discharge so [employer] can call lab results to MD.    Called PCP DR Aida Puffer office was told to call [phone] and leave a message and Dr Clarene Duke will call back on the weekend . NCM called left message. Notified attending and secure chatted Dr Lajoyce Corners ( currently in a procedure).   Dr Lajoyce Corners will follow IV ABX and labs   Ordered dressing supplies with Velna Hatchet with [employer]. [employer] will need progress note with wound description including measurements. Bedside nurse, attending and Dr Lajoyce Corners aware.  1444 Sent secure chat to attending and Dr Lajoyce Corners , after reading Dr Ephriam Knuckles note to see if they still want to discharge patient with IV ABX. Pam with [facility] awaiting determination. If IV ABX needed at home [employer] will need  to know by 1530 for discharge today. Attending aware.  1555 Dr Lajoyce Corners determination to follow Dr Comer's recommendations. PICC to be removed and patient will go home on PO Augmentin . Pam with [employer] aware. Will allow MD to explain to patient  Expected Discharge Plan: Home w Home Health Services     Patient Goals and CMS Choice Patient states their goals for this hospitalization and ongoing recovery are:: to return to home CMS Medicare.gov Compare Post Acute Care list provided to:: Patient Choice offered to / list presented to : Patient  Expected Discharge Plan and Services Expected Discharge Plan: Home w Home Health Services   Discharge Planning Services: CM Consult Post Acute Care Choice: Home Health                     DME Agency: NA       HH Arranged: NA          Prior Living Arrangements/Services   Lives with:: Spouse Patient language and need for interpreter reviewed:: Yes Do you feel safe going back to the place where you live?: Yes      Need for Family Participation in Patient Care: Yes (Comment) Care giver support system in place?: Yes (comment)   Criminal Activity/Legal Involvement Pertinent to Current Situation/Hospitalization: No - Comment as needed  Activities of Daily Living Home Assistive Devices/Equipment: CBG Meter ADL Screening (condition at  time of admission) Patient's cognitive ability adequate to safely complete daily activities?: Yes Is the patient deaf or have difficulty hearing?: No Does the patient have difficulty seeing, even when wearing glasses/contacts?: No Does the patient have difficulty concentrating, remembering, or making decisions?: No Patient able to express need for assistance with ADLs?: Yes Does the patient have difficulty dressing or bathing?: Yes Independently performs ADLs?: Yes (appropriate for developmental age) Does the patient have difficulty walking or climbing stairs?: No Weakness of Legs: None Weakness of Arms/Hands:  None  Permission Sought/Granted   Permission granted to share information with : No              Emotional Assessment Appearance:: Appears stated age Attitude/Demeanor/Rapport: Engaged Affect (typically observed): Accepting Orientation: : Oriented to Self, Oriented to Place, Oriented to  Time, Oriented to Situation Alcohol / Substance Use: Not Applicable Psych Involvement: No (comment)  Admission diagnosis:  Cellulitis of right lower extremity [L03.115] Post-operative infection [T81.40XA] Wound infection after surgery [T81.49XA] Poorly controlled type 2 diabetes mellitus (HCC) [E11.65] Patient Active Problem List   Diagnosis Date Noted   Wound infection after surgery 09/16/2021   Cellulitis of right lower extremity    Peripheral vascular disease (HCC)    History of transmetatarsal amputation of right foot (HCC) 08/25/2021   Osteomyelitis (HCC) 07/24/2021   HTN (hypertension) 07/23/2021   Hyperlipidemia 07/23/2021   Diabetic foot ulcers (HCC) 07/23/2021   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) 01/14/2018   Ulcer of left foot, limited to breakdown of skin (HCC) 01/14/2018   Foot osteomyelitis, left (HCC)    Septic arthritis of foot (HCC) 11/11/2017   Osteomyelitis of ankle or foot, acute, left (HCC) 11/11/2017   Type 2 DM with diabetic peripheral angiopathy w/o gangrene (HCC) 11/11/2017   Cigarette smoker 11/11/2017   Diabetic peripheral neuropathy (HCC) 11/11/2017   Diabetic foot infection (HCC) 11/10/2017   Post op infection    Delirium 12/07/2014   Cervical vertebral fusion 12/07/2014   Hallucination 12/07/2014   Poorly controlled type 2 diabetes mellitus (HCC) 12/07/2014   Mood disorder (HCC) 12/07/2014   Pyrexia    Neck swelling    PCP:  Aida Puffer, MD Pharmacy:   [facility] - [place], [place] - [address] [place] [address] Phone: [phone] Fax: [phone]  [facility] [address] [place] [address] Phone: [phone] Fax: [phone]     Social Determinants of Health (SDOH) Interventions    Readmission Risk Interventions No flowsheet data found.

## 2021-09-21 LAB — CULTURE, BLOOD (ROUTINE X 2)
Culture: NO GROWTH
Special Requests: ADEQUATE

## 2021-09-22 ENCOUNTER — Other Ambulatory Visit: Payer: Self-pay

## 2021-09-22 ENCOUNTER — Ambulatory Visit (INDEPENDENT_AMBULATORY_CARE_PROVIDER_SITE_OTHER): Payer: 59 | Admitting: Family

## 2021-09-22 ENCOUNTER — Encounter: Payer: Self-pay | Admitting: Family

## 2021-09-22 DIAGNOSIS — M869 Osteomyelitis, unspecified: Secondary | ICD-10-CM

## 2021-09-22 NOTE — Progress Notes (Signed)
Post-Op Visit Note   Patient: Cody Pena           Date of Birth: Jun 21, 1961           MRN: [id number] Visit Date: 09/22/2021 PCP: Aida Puffer, MD  Chief Complaint: No chief complaint on file.   HPI:  HPI The patient is a 61 year old gentleman who presents today in follow-up he has had hospitalization in the interim since his last postop visit he had worsening of his transmetatarsal amputation wound dehiscence as well as infection associated with this  Is currently taking Augmentin.  His MRI was not conclusive for osteomyelitis while hospitalized. Ortho Exam On examination of the right foot there is dehiscence of the transmetatarsal amputation medially.  There is mild surrounding erythema no significant edema no significant drainage.  However unable to probe to bone today.  He is having tenderness along his first ray  Visit Diagnoses: No diagnosis found.  Plan: We will plan for below-knee amputation versus revision transmetatarsal amputation.  The patient would like Dr. Lajoyce Corners to have the final input on surgical recommendation.  He will continue with dry dressing changes continue his Augmentin plan for surgery next Wednesday discussed return precautions  Follow-Up Instructions: No follow-ups on file.   Imaging: No results found.  Orders:  No orders of the defined types were placed in this encounter.  No orders of the defined types were placed in this encounter.    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) 01/14/2018   Ulcer of left foot, limited to  breakdown of skin (HCC) 01/14/2018   Foot osteomyelitis, left (HCC)    Septic arthritis of foot (HCC) 11/11/2017   Osteomyelitis of ankle or foot, acute, left (HCC) 11/11/2017   Type 2 DM with diabetic peripheral angiopathy w/o gangrene (HCC) 11/11/2017   Cigarette smoker 11/11/2017   Diabetic peripheral neuropathy (HCC) 11/11/2017   Diabetic foot infection (HCC) 11/10/2017   Post op infection    Delirium 12/07/2014   Cervical vertebral fusion 12/07/2014   Hallucination 12/07/2014   Poorly controlled type 2 diabetes mellitus (HCC) 12/07/2014   Mood disorder (HCC) 12/07/2014   Pyrexia    Neck swelling    Past Medical History:  Diagnosis Date   Diabetes mellitus without complication (HCC)    Hypertension    Peripheral vascular disease (HCC)     History reviewed. No pertinent family history.  Past Surgical History:  Procedure Laterality Date   ABDOMINAL AORTOGRAM W/LOWER EXTREMITY N/A 08/11/2021   Procedure: ABDOMINAL AORTOGRAM W/LOWER EXTREMITY;  Surgeon: Leonie Douglas, MD;  Location: [facility];  Service: Cardiovascular;  Laterality: N/A;   AMPUTATION Left 11/15/2017   Procedure: LEFT FOOT 2ND RAY AMPUTATION;  Surgeon: Nadara Mustard, MD;  Location: [facility] OR;  Service: Orthopedics;  Laterality: Left;   AMPUTATION Right 08/25/2021   Procedure: RIGHT TRANSMETATARSAL AMPUTATION;  Surgeon: Nadara Mustard, MD;  Location: [facility] OR;  Service: Orthopedics;  Laterality: Right;   CERVICAL FUSION     IR ANGIOGRAM EXTREMITY LEFT  11/14/2017   IR ANGIOGRAM SELECTIVE EACH ADDITIONAL  VESSEL  11/14/2017   IR ANGIOGRAM SELECTIVE EACH ADDITIONAL VESSEL  11/14/2017   IR TIB-PERO ART PTA MOD SED  11/14/2017   IR TIB-PERO ART UNI PTA EA ADD VESSEL MOD SED  11/14/2017   IR US GUIDE VASC ACCESS LEFT  11/14/2017   PERIPHERAL VASCULAR INTERVENTION Right 08/11/2021   Procedure: PERIPHERAL VASCULAR INTERVENTION;  Surgeon: Leonie Douglas, MD;  Location: [facility];  Service: Cardiovascular;  Laterality:  Right;  rt sfa stent   Social History   Occupational History   Not on file  Tobacco Use   Smoking status: Every Day    Years: 47.00    Types: Cigarettes    Last attempt to quit: 08/11/2021    Years since quitting: 0.1   Smokeless tobacco: Never  Vaping Use   Vaping Use: Some days  Substance and Sexual Activity   Alcohol use: No   Drug use: No   Sexual activity: Not on file

## 2021-09-25 ENCOUNTER — Encounter (HOSPITAL_COMMUNITY): Payer: Self-pay | Admitting: Orthopedic Surgery

## 2021-09-25 ENCOUNTER — Other Ambulatory Visit: Payer: Self-pay

## 2021-09-25 NOTE — Progress Notes (Signed)
Spoke with pt for pre-op call. Pt denies cardiac history. Pt is treated for HTN and Type 1 diabetes. Pt's last A1C was 12.5 on 07/23/21. Pt states his blood sugar is usually around 200. Instructed pt to take 80% of his Basaglar insulin Tuesday PM, he will take 19 units. Instructed pt to check his blood sugar when he wakes up and every 2 hours until he leaves for the hospital. If blood sugar is >220 take 1/2 of usual correction dose of Novolog insulin. If blood sugar is 70 or below, treat with 1/2 cup of clear juice (apple or cranberry) and recheck blood sugar 15 minutes after drinking juice. If blood sugar continues to be 70 or below, call the Short Stay department and ask to speak to a nurse. Pt voiced understanding.   Covid test on arrival.

## 2021-09-26 MED ORDER — TRANEXAMIC ACID 1000 MG/10ML IV SOLN
2000.0000 mg | INTRAVENOUS | Status: DC
  Filled 2021-09-26: qty 20

## 2021-09-27 ENCOUNTER — Encounter (HOSPITAL_COMMUNITY): Payer: Self-pay | Admitting: Orthopedic Surgery

## 2021-09-27 ENCOUNTER — Other Ambulatory Visit: Payer: Self-pay

## 2021-09-27 ENCOUNTER — Ambulatory Visit (HOSPITAL_COMMUNITY): Payer: 59 | Admitting: Anesthesiology

## 2021-09-27 ENCOUNTER — Inpatient Hospital Stay (HOSPITAL_COMMUNITY)
Admission: AD | Admit: 2021-09-27 | Discharge: 2021-09-30 | DRG: 240 | Disposition: A | Discharge status: Rehabilitation Facility | Payer: 59 | Attending: Orthopedic Surgery | Admitting: Orthopedic Surgery

## 2021-09-27 ENCOUNTER — Encounter (HOSPITAL_COMMUNITY)
Admission: AD | Disposition: A | Discharge status: Rehabilitation Facility | Payer: Self-pay | Source: Home / Self Care | Attending: Orthopedic Surgery

## 2021-09-27 DIAGNOSIS — I10 Essential (primary) hypertension: Secondary | ICD-10-CM | POA: Diagnosis present

## 2021-09-27 DIAGNOSIS — Z888 Allergy status to other drugs, medicaments and biological substances status: Secondary | ICD-10-CM

## 2021-09-27 DIAGNOSIS — S88111A Complete traumatic amputation at level between knee and ankle, right lower leg, initial encounter: Secondary | ICD-10-CM | POA: Diagnosis not present

## 2021-09-27 DIAGNOSIS — T8781 Dehiscence of amputation stump: Secondary | ICD-10-CM

## 2021-09-27 DIAGNOSIS — Y835 Amputation of limb(s) as the cause of abnormal reaction of the patient, or of later complication, without mention of misadventure at the time of the procedure: Secondary | ICD-10-CM | POA: Diagnosis present

## 2021-09-27 DIAGNOSIS — Z8616 Personal history of COVID-19: Secondary | ICD-10-CM

## 2021-09-27 DIAGNOSIS — E1152 Type 2 diabetes mellitus with diabetic peripheral angiopathy with gangrene: Secondary | ICD-10-CM | POA: Diagnosis present

## 2021-09-27 DIAGNOSIS — T8789 Other complications of amputation stump: Secondary | ICD-10-CM

## 2021-09-27 DIAGNOSIS — Z89431 Acquired absence of right foot: Secondary | ICD-10-CM | POA: Diagnosis not present

## 2021-09-27 DIAGNOSIS — F17210 Nicotine dependence, cigarettes, uncomplicated: Secondary | ICD-10-CM | POA: Diagnosis present

## 2021-09-27 DIAGNOSIS — M869 Osteomyelitis, unspecified: Secondary | ICD-10-CM | POA: Diagnosis not present

## 2021-09-27 DIAGNOSIS — E1169 Type 2 diabetes mellitus with other specified complication: Secondary | ICD-10-CM | POA: Diagnosis present

## 2021-09-27 DIAGNOSIS — M868X7 Other osteomyelitis, ankle and foot: Secondary | ICD-10-CM | POA: Diagnosis present

## 2021-09-27 DIAGNOSIS — Z20822 Contact with and (suspected) exposure to covid-19: Secondary | ICD-10-CM | POA: Diagnosis present

## 2021-09-27 DIAGNOSIS — L03115 Cellulitis of right lower limb: Secondary | ICD-10-CM | POA: Diagnosis present

## 2021-09-27 DIAGNOSIS — E785 Hyperlipidemia, unspecified: Secondary | ICD-10-CM | POA: Diagnosis present

## 2021-09-27 DIAGNOSIS — Z7984 Long term (current) use of oral hypoglycemic drugs: Secondary | ICD-10-CM

## 2021-09-27 HISTORY — PX: AMPUTATION: SHX166

## 2021-09-27 HISTORY — DX: Depression, unspecified: F32.A

## 2021-09-27 HISTORY — DX: COVID-19: U07.1

## 2021-09-27 LAB — COMPREHENSIVE METABOLIC PANEL
ALT: 14 U/L (ref 0–44)
AST: 13 U/L — ABNORMAL LOW (ref 15–41)
Albumin: 2.8 g/dL — ABNORMAL LOW (ref 3.5–5.0)
Alkaline Phosphatase: 81 U/L (ref 38–126)
Anion gap: 9 (ref 5–15)
BUN: 11 mg/dL (ref 8–23)
CO2: 26 mmol/L (ref 22–32)
Calcium: 8.4 mg/dL — ABNORMAL LOW (ref 8.9–10.3)
Chloride: 95 mmol/L — ABNORMAL LOW (ref 98–111)
Creatinine, Ser: 0.81 mg/dL (ref 0.61–1.24)
GFR, Estimated: >60 mL/min (ref >60)
Glucose, Bld: 246 mg/dL — ABNORMAL HIGH (ref 70–99)
Potassium: 3.9 mmol/L (ref 3.5–5.1)
Sodium: 130 mmol/L — ABNORMAL LOW (ref 135–145)
Total Bilirubin: 0.5 mg/dL (ref 0.3–1.2)
Total Protein: 6.6 g/dL (ref 6.5–8.1)

## 2021-09-27 LAB — CBC WITH DIFFERENTIAL/PLATELET
Abs Immature Granulocytes: 0.05 10*3/uL (ref 0.00–0.07)
Basophils Absolute: 0.1 10*3/uL (ref 0.0–0.1)
Basophils Relative: 1 %
Eosinophils Absolute: 0.4 10*3/uL (ref 0.0–0.5)
Eosinophils Relative: 3 %
HCT: 35.7 % — ABNORMAL LOW (ref 39.0–52.0)
Hemoglobin: 11.6 g/dL — ABNORMAL LOW (ref 13.0–17.0)
Immature Granulocytes: 0 %
Lymphocytes Relative: 11 %
Lymphs Abs: 1.5 10*3/uL (ref 0.7–4.0)
MCH: 30.8 pg (ref 26.0–34.0)
MCHC: 32.5 g/dL (ref 30.0–36.0)
MCV: 94.7 fL (ref 80.0–100.0)
Monocytes Absolute: 1.2 10*3/uL — ABNORMAL HIGH (ref 0.1–1.0)
Monocytes Relative: 9 %
Neutro Abs: 10.4 10*3/uL — ABNORMAL HIGH (ref 1.7–7.7)
Neutrophils Relative %: 76 %
Platelets: 164 10*3/uL (ref 150–400)
RBC: 3.77 MIL/uL — ABNORMAL LOW (ref 4.22–5.81)
RDW: 13 % (ref 11.5–15.5)
WBC: 13.7 10*3/uL — ABNORMAL HIGH (ref 4.0–10.5)
nRBC: 0 % (ref 0.0–0.2)

## 2021-09-27 LAB — PREALBUMIN: Prealbumin: 11.9 mg/dL — ABNORMAL LOW (ref 18–38)

## 2021-09-27 LAB — HEMOGLOBIN A1C
Hgb A1c MFr Bld: 9.2 % — ABNORMAL HIGH (ref 4.8–5.6)
Mean Plasma Glucose: 217.34 mg/dL

## 2021-09-27 LAB — GLUCOSE, CAPILLARY
Glucose-Capillary: 254 mg/dL — ABNORMAL HIGH (ref 70–99)
Glucose-Capillary: 205 mg/dL — ABNORMAL HIGH (ref 70–99)
Glucose-Capillary: 186 mg/dL — ABNORMAL HIGH (ref 70–99)
Glucose-Capillary: 198 mg/dL — ABNORMAL HIGH (ref 70–99)

## 2021-09-27 LAB — VITAMIN D 25 HYDROXY (VIT D DEFICIENCY, FRACTURES): Vit D, 25-Hydroxy: 33.23 ng/mL (ref 30–100)

## 2021-09-27 LAB — SARS CORONAVIRUS 2 BY RT PCR (HOSPITAL ORDER, PERFORMED IN ~~LOC~~ HOSPITAL LAB): SARS Coronavirus 2: NEGATIVE

## 2021-09-27 LAB — MAGNESIUM: Magnesium: 1.8 mg/dL (ref 1.7–2.4)

## 2021-09-27 SURGERY — AMPUTATION BELOW KNEE
Anesthesia: General | Site: Knee | Laterality: Right

## 2021-09-27 MED ORDER — ONDANSETRON HCL 4 MG/2ML IJ SOLN: 4.0000 mg | Freq: Once | INTRAMUSCULAR | Status: DC | PRN

## 2021-09-27 MED ORDER — ALBUTEROL SULFATE (2.5 MG/3ML) 0.083% IN NEBU: 3.0000 mL | INHALATION_SOLUTION | Freq: Four times a day (QID) | RESPIRATORY_TRACT | Status: DC | PRN

## 2021-09-27 MED ORDER — OXYCODONE HCL 5 MG PO TABS: 5.0000 mg | ORAL_TABLET | Freq: Once | ORAL | Status: DC | PRN

## 2021-09-27 MED ORDER — POLYETHYLENE GLYCOL 3350 17 G PO PACK: 17.0000 g | PACK | Freq: Every day | ORAL | Status: DC | PRN

## 2021-09-27 MED ORDER — PHENYLEPHRINE 40 MCG/ML (10ML) SYRINGE FOR IV PUSH (FOR BLOOD PRESSURE SUPPORT)
PREFILLED_SYRINGE | INTRAVENOUS | Status: AC
  Filled 2021-09-27: qty 10

## 2021-09-27 MED ORDER — PROPOFOL 10 MG/ML IV BOLUS
INTRAVENOUS | Status: DC | PRN
  Administered 2021-09-27: 200 mg via INTRAVENOUS

## 2021-09-27 MED ORDER — FENTANYL CITRATE (PF) 100 MCG/2ML IJ SOLN
INTRAMUSCULAR | Status: AC
  Administered 2021-09-27: 50 ug
  Filled 2021-09-27: qty 2

## 2021-09-27 MED ORDER — INSULIN GLARGINE-YFGN 100 UNIT/ML ~~LOC~~ SOLN
20.0000 [IU] | Freq: Every day | SUBCUTANEOUS | Status: DC
  Administered 2021-09-27 – 2021-09-29 (×3): 20 [IU] via SUBCUTANEOUS
  Filled 2021-09-27 (×4): qty 0.2

## 2021-09-27 MED ORDER — ZINC SULFATE 220 (50 ZN) MG PO CAPS
220.0000 mg | ORAL_CAPSULE | Freq: Every day | ORAL | Status: DC
  Administered 2021-09-27 – 2021-09-30 (×4): 220 mg via ORAL
  Filled 2021-09-27 (×4): qty 1

## 2021-09-27 MED ORDER — CLONAZEPAM 0.5 MG PO TABS
0.5000 mg | ORAL_TABLET | Freq: Three times a day (TID) | ORAL | Status: DC
Start: 2021-09-27 — End: 2021-09-30
  Administered 2021-09-27 – 2021-09-30 (×10): 0.5 mg via ORAL
  Filled 2021-09-27 (×10): qty 1

## 2021-09-27 MED ORDER — CHLORHEXIDINE GLUCONATE 0.12 % MT SOLN
15.0000 mL | Freq: Once | OROMUCOSAL | Status: AC
  Administered 2021-09-27: 15 mL via OROMUCOSAL
  Filled 2021-09-27: qty 15

## 2021-09-27 MED ORDER — FENTANYL CITRATE (PF) 100 MCG/2ML IJ SOLN: 50.0000 ug | Freq: Once | INTRAMUSCULAR | Status: DC

## 2021-09-27 MED ORDER — OXYCODONE HCL 5 MG PO TABS
5.0000 mg | ORAL_TABLET | ORAL | Status: DC | PRN
  Filled 2021-09-27: qty 2

## 2021-09-27 MED ORDER — ORAL CARE MOUTH RINSE: 15.0000 mL | Freq: Once | OROMUCOSAL | Status: AC

## 2021-09-27 MED ORDER — CLOPIDOGREL BISULFATE 75 MG PO TABS
75.0000 mg | ORAL_TABLET | Freq: Every day | ORAL | Status: DC
  Administered 2021-09-27 – 2021-09-30 (×4): 75 mg via ORAL
  Filled 2021-09-27 (×4): qty 1

## 2021-09-27 MED ORDER — INSULIN ASPART 100 UNIT/ML IJ SOLN
4.0000 [IU] | Freq: Three times a day (TID) | INTRAMUSCULAR | Status: DC
  Administered 2021-09-27 – 2021-09-30 (×10): 4 [IU] via SUBCUTANEOUS

## 2021-09-27 MED ORDER — ROPIVACAINE HCL 5 MG/ML IJ SOLN
INTRAMUSCULAR | Status: DC | PRN
  Administered 2021-09-27: 30 mL via PERINEURAL

## 2021-09-27 MED ORDER — POTASSIUM CHLORIDE CRYS ER 20 MEQ PO TBCR: 20.0000 meq | EXTENDED_RELEASE_TABLET | Freq: Every day | ORAL | Status: DC | PRN

## 2021-09-27 MED ORDER — PHENOL 1.4 % MT LIQD: 1.0000 | OROMUCOSAL | Status: DC | PRN

## 2021-09-27 MED ORDER — ALUM & MAG HYDROXIDE-SIMETH 200-200-20 MG/5ML PO SUSP: 15.0000 mL | ORAL | Status: DC | PRN

## 2021-09-27 MED ORDER — DOCUSATE SODIUM 100 MG PO CAPS
100.0000 mg | ORAL_CAPSULE | Freq: Every day | ORAL | Status: DC
  Administered 2021-09-28 – 2021-09-30 (×3): 100 mg via ORAL
  Filled 2021-09-27 (×3): qty 1

## 2021-09-27 MED ORDER — EPHEDRINE SULFATE-NACL 50-0.9 MG/10ML-% IV SOSY
PREFILLED_SYRINGE | INTRAVENOUS | Status: DC | PRN
  Administered 2021-09-27 (×2): 10 mg via INTRAVENOUS

## 2021-09-27 MED ORDER — HYDROMORPHONE HCL 1 MG/ML IJ SOLN: 0.2500 mg | INTRAMUSCULAR | Status: DC | PRN

## 2021-09-27 MED ORDER — ONDANSETRON HCL 4 MG/2ML IJ SOLN
INTRAMUSCULAR | Status: AC
  Filled 2021-09-27: qty 2

## 2021-09-27 MED ORDER — 0.9 % SODIUM CHLORIDE (POUR BTL) OPTIME
TOPICAL | Status: DC | PRN
  Administered 2021-09-27: 1000 mL

## 2021-09-27 MED ORDER — LIDOCAINE 2% (20 MG/ML) 5 ML SYRINGE
INTRAMUSCULAR | Status: AC
  Filled 2021-09-27: qty 5

## 2021-09-27 MED ORDER — PIPERACILLIN-TAZOBACTAM 3.375 G IVPB
3.3750 g | Freq: Three times a day (TID) | INTRAVENOUS | Status: AC
  Administered 2021-09-27 – 2021-09-28 (×3): 3.375 g via INTRAVENOUS
  Filled 2021-09-27 (×3): qty 50

## 2021-09-27 MED ORDER — METOPROLOL TARTRATE 5 MG/5ML IV SOLN: 2.0000 mg | INTRAVENOUS | Status: DC | PRN

## 2021-09-27 MED ORDER — CLINDAMYCIN PHOSPHATE 900 MG/50ML IV SOLN
900.0000 mg | INTRAVENOUS | Status: AC
  Administered 2021-09-27: 900 mg via INTRAVENOUS
  Filled 2021-09-27: qty 50

## 2021-09-27 MED ORDER — ASPIRIN EC 81 MG PO TBEC
81.0000 mg | DELAYED_RELEASE_TABLET | Freq: Every day | ORAL | Status: DC
  Administered 2021-09-27 – 2021-09-30 (×4): 81 mg via ORAL
  Filled 2021-09-27 (×4): qty 1

## 2021-09-27 MED ORDER — BISACODYL 5 MG PO TBEC: 5.0000 mg | DELAYED_RELEASE_TABLET | Freq: Every day | ORAL | Status: DC | PRN

## 2021-09-27 MED ORDER — LABETALOL HCL 5 MG/ML IV SOLN: 10.0000 mg | INTRAVENOUS | Status: DC | PRN

## 2021-09-27 MED ORDER — INSULIN ASPART 100 UNIT/ML IJ SOLN
0.0000 [IU] | Freq: Three times a day (TID) | INTRAMUSCULAR | Status: DC
  Administered 2021-09-27: 3 [IU] via SUBCUTANEOUS
  Administered 2021-09-28: 2 [IU] via SUBCUTANEOUS
  Administered 2021-09-28 (×2): 5 [IU] via SUBCUTANEOUS
  Administered 2021-09-29: 2 [IU] via SUBCUTANEOUS
  Administered 2021-09-29 (×2): 3 [IU] via SUBCUTANEOUS
  Administered 2021-09-30: 11 [IU] via SUBCUTANEOUS
  Administered 2021-09-30: 2 [IU] via SUBCUTANEOUS

## 2021-09-27 MED ORDER — LACTATED RINGERS IV SOLN: INTRAVENOUS | Status: DC

## 2021-09-27 MED ORDER — GUAIFENESIN-DM 100-10 MG/5ML PO SYRP: 15.0000 mL | ORAL_SOLUTION | ORAL | Status: DC | PRN

## 2021-09-27 MED ORDER — MAGNESIUM SULFATE 2 GM/50ML IV SOLN: 2.0000 g | Freq: Every day | INTRAVENOUS | Status: DC | PRN

## 2021-09-27 MED ORDER — HYDROMORPHONE HCL 1 MG/ML IJ SOLN
0.5000 mg | INTRAMUSCULAR | Status: DC | PRN
  Administered 2021-09-27 – 2021-09-28 (×4): 1 mg via INTRAVENOUS
  Filled 2021-09-27 (×4): qty 1

## 2021-09-27 MED ORDER — PROPOFOL 10 MG/ML IV BOLUS
INTRAVENOUS | Status: AC
  Filled 2021-09-27: qty 20

## 2021-09-27 MED ORDER — LIDOCAINE 2% (20 MG/ML) 5 ML SYRINGE
INTRAMUSCULAR | Status: DC | PRN
  Administered 2021-09-27: 60 mg via INTRAVENOUS

## 2021-09-27 MED ORDER — NICOTINE 21 MG/24HR TD PT24
21.0000 mg | MEDICATED_PATCH | Freq: Every day | TRANSDERMAL | Status: DC
  Administered 2021-09-27 – 2021-09-30 (×4): 21 mg via TRANSDERMAL
  Filled 2021-09-27 (×4): qty 1

## 2021-09-27 MED ORDER — INSULIN ASPART 100 UNIT/ML IJ SOLN
0.0000 [IU] | INTRAMUSCULAR | Status: DC | PRN
  Administered 2021-09-27: 6 [IU] via SUBCUTANEOUS
  Filled 2021-09-27: qty 1

## 2021-09-27 MED ORDER — SODIUM CHLORIDE 0.9 % IV SOLN: INTRAVENOUS | Status: DC

## 2021-09-27 MED ORDER — HYDRALAZINE HCL 20 MG/ML IJ SOLN: 5.0000 mg | INTRAMUSCULAR | Status: DC | PRN

## 2021-09-27 MED ORDER — ONDANSETRON HCL 4 MG/2ML IJ SOLN
INTRAMUSCULAR | Status: DC | PRN
  Administered 2021-09-27: 4 mg via INTRAVENOUS

## 2021-09-27 MED ORDER — MIDAZOLAM HCL 2 MG/2ML IJ SOLN: 2.0000 mg | Freq: Once | INTRAMUSCULAR | Status: DC

## 2021-09-27 MED ORDER — ONDANSETRON HCL 4 MG/2ML IJ SOLN: 4.0000 mg | Freq: Four times a day (QID) | INTRAMUSCULAR | Status: DC | PRN

## 2021-09-27 MED ORDER — FENTANYL CITRATE (PF) 250 MCG/5ML IJ SOLN
INTRAMUSCULAR | Status: AC
  Filled 2021-09-27: qty 5

## 2021-09-27 MED ORDER — TRANEXAMIC ACID-NACL 1000-0.7 MG/100ML-% IV SOLN
1000.0000 mg | INTRAVENOUS | Status: AC
  Administered 2021-09-27: 1000 mg via INTRAVENOUS
  Filled 2021-09-27: qty 100

## 2021-09-27 MED ORDER — MAGNESIUM CITRATE PO SOLN: 1.0000 | Freq: Once | ORAL | Status: DC | PRN

## 2021-09-27 MED ORDER — ROPIVACAINE HCL 7.5 MG/ML IJ SOLN
INTRAMUSCULAR | Status: DC | PRN
  Administered 2021-09-27: 20 mL via PERINEURAL

## 2021-09-27 MED ORDER — PANTOPRAZOLE SODIUM 40 MG PO TBEC
40.0000 mg | DELAYED_RELEASE_TABLET | Freq: Every day | ORAL | Status: DC
  Administered 2021-09-27 – 2021-09-30 (×4): 40 mg via ORAL
  Filled 2021-09-27 (×4): qty 1

## 2021-09-27 MED ORDER — ACETAMINOPHEN 325 MG PO TABS
325.0000 mg | ORAL_TABLET | Freq: Four times a day (QID) | ORAL | Status: DC | PRN
  Administered 2021-09-28 – 2021-09-30 (×2): 650 mg via ORAL
  Filled 2021-09-27: qty 2
  Filled 2021-09-27: qty 1
  Filled 2021-09-27: qty 2

## 2021-09-27 MED ORDER — EPHEDRINE 5 MG/ML INJ
INTRAVENOUS | Status: AC
  Filled 2021-09-27: qty 5

## 2021-09-27 MED ORDER — JUVEN PO PACK
1.0000 | PACK | Freq: Two times a day (BID) | ORAL | Status: DC
  Administered 2021-09-27 – 2021-09-30 (×7): 1 via ORAL
  Filled 2021-09-27 (×7): qty 1

## 2021-09-27 MED ORDER — MIDAZOLAM HCL 2 MG/2ML IJ SOLN
INTRAMUSCULAR | Status: AC
  Filled 2021-09-27: qty 2

## 2021-09-27 MED ORDER — CITALOPRAM HYDROBROMIDE 20 MG PO TABS
20.0000 mg | ORAL_TABLET | Freq: Every day | ORAL | Status: DC
  Administered 2021-09-27 – 2021-09-30 (×4): 20 mg via ORAL
  Filled 2021-09-27 (×4): qty 1

## 2021-09-27 MED ORDER — GABAPENTIN 600 MG PO TABS
600.0000 mg | ORAL_TABLET | Freq: Three times a day (TID) | ORAL | Status: DC
Start: 2021-09-27 — End: 2021-09-30
  Administered 2021-09-27 – 2021-09-30 (×10): 600 mg via ORAL
  Filled 2021-09-27 (×10): qty 1

## 2021-09-27 MED ORDER — OXYCODONE HCL 5 MG/5ML PO SOLN: 5.0000 mg | Freq: Once | ORAL | Status: DC | PRN

## 2021-09-27 MED ORDER — OXYCODONE HCL 5 MG PO TABS
10.0000 mg | ORAL_TABLET | ORAL | Status: DC | PRN
  Administered 2021-09-27 – 2021-09-30 (×10): 15 mg via ORAL
  Filled 2021-09-27 (×11): qty 3

## 2021-09-27 MED ORDER — PHENYLEPHRINE 40 MCG/ML (10ML) SYRINGE FOR IV PUSH (FOR BLOOD PRESSURE SUPPORT)
PREFILLED_SYRINGE | INTRAVENOUS | Status: DC | PRN
  Administered 2021-09-27 (×3): 120 ug via INTRAVENOUS
  Administered 2021-09-27: 40 ug via INTRAVENOUS

## 2021-09-27 MED ORDER — INSULIN ASPART 100 UNIT/ML IJ SOLN
5.0000 [IU] | Freq: Once | INTRAMUSCULAR | Status: AC
  Administered 2021-09-27: 5 [IU] via SUBCUTANEOUS

## 2021-09-27 MED ORDER — MIDAZOLAM HCL 2 MG/2ML IJ SOLN
INTRAMUSCULAR | Status: AC
  Administered 2021-09-27: 2 mg
  Filled 2021-09-27: qty 2

## 2021-09-27 MED ORDER — ASCORBIC ACID 500 MG PO TABS
1000.0000 mg | ORAL_TABLET | Freq: Every day | ORAL | Status: DC
  Administered 2021-09-27 – 2021-09-30 (×4): 1000 mg via ORAL
  Filled 2021-09-27 (×4): qty 2

## 2021-09-27 MED ORDER — ATORVASTATIN CALCIUM 80 MG PO TABS
80.0000 mg | ORAL_TABLET | Freq: Every day | ORAL | Status: DC
  Administered 2021-09-27 – 2021-09-30 (×4): 80 mg via ORAL
  Filled 2021-09-27 (×4): qty 1

## 2021-09-27 MED ORDER — METFORMIN HCL 500 MG PO TABS
500.0000 mg | ORAL_TABLET | Freq: Two times a day (BID) | ORAL | Status: DC
Start: 2021-09-27 — End: 2021-09-30
  Administered 2021-09-27 – 2021-09-30 (×7): 500 mg via ORAL
  Filled 2021-09-27 (×7): qty 1

## 2021-09-27 MED ORDER — BASAGLAR KWIKPEN 100 UNIT/ML ~~LOC~~ SOPN: 24.0000 [IU] | PEN_INJECTOR | Freq: Every day | SUBCUTANEOUS | Status: DC

## 2021-09-27 SURGICAL SUPPLY — 40 items
BAG COUNTER SPONGE SURGICOUNT (BAG) IMPLANT
BAG SPNG CNTER NS LX DISP (BAG)
BLADE SAW RECIP 87.9 MT (BLADE) ×2 IMPLANT
BLADE SURG 21 STRL SS (BLADE) ×2 IMPLANT
BNDG COHESIVE 6X5 TAN STRL LF (GAUZE/BANDAGES/DRESSINGS) IMPLANT
CANISTER WOUND CARE 500ML ATS (WOUND CARE) ×2 IMPLANT
COVER SURGICAL LIGHT HANDLE (MISCELLANEOUS) ×2 IMPLANT
CUFF TOURN SGL QUICK 34 (TOURNIQUET CUFF) ×2
CUFF TRNQT CYL 34X4.125X (TOURNIQUET CUFF) ×1 IMPLANT
DRAPE INCISE IOBAN 66X45 STRL (DRAPES) ×2 IMPLANT
DRAPE U-SHAPE 47X51 STRL (DRAPES) ×2 IMPLANT
DRESSING PREVENA PLUS CUSTOM (GAUZE/BANDAGES/DRESSINGS) ×1 IMPLANT
DRSG PREVENA PLUS CUSTOM (GAUZE/BANDAGES/DRESSINGS) ×2
DURAPREP 26ML APPLICATOR (WOUND CARE) ×2 IMPLANT
ELECT REM PT RETURN 9FT ADLT (ELECTROSURGICAL) ×2
ELECTRODE REM PT RTRN 9FT ADLT (ELECTROSURGICAL) ×1 IMPLANT
GLOVE SURG ORTHO LTX SZ9 (GLOVE) ×2 IMPLANT
GLOVE SURG UNDER POLY LF SZ9 (GLOVE) ×2 IMPLANT
GOWN STRL REUS W/ TWL XL LVL3 (GOWN DISPOSABLE) ×2 IMPLANT
GOWN STRL REUS W/TWL XL LVL3 (GOWN DISPOSABLE) ×4
GRAFT SKIN WND OMEGA3 7X10 (Tissue) ×2 IMPLANT
GRAFT SKN 7X10XSTRL LF DISP (Tissue) IMPLANT
KIT BASIN OR (CUSTOM PROCEDURE TRAY) ×2 IMPLANT
KIT TURNOVER KIT B (KITS) ×2 IMPLANT
MANIFOLD NEPTUNE II (INSTRUMENTS) ×2 IMPLANT
NS IRRIG 1000ML POUR BTL (IV SOLUTION) ×2 IMPLANT
PACK ORTHO EXTREMITY (CUSTOM PROCEDURE TRAY) ×2 IMPLANT
PAD ARMBOARD 7.5X6 YLW CONV (MISCELLANEOUS) ×2 IMPLANT
PREVENA RESTOR ARTHOFORM 46X30 (CANNISTER) ×2 IMPLANT
PUTTY DBM STAGRAFT PLUS 5CC (Putty) ×1 IMPLANT
SPONGE T-LAP 18X18 ~~LOC~~+RFID (SPONGE) IMPLANT
STAPLER VISISTAT 35W (STAPLE) IMPLANT
STOCKINETTE IMPERVIOUS LG (DRAPES) ×2 IMPLANT
SUT ETHILON 2 0 PSLX (SUTURE) IMPLANT
SUT SILK 2 0 (SUTURE) ×2
SUT SILK 2-0 18XBRD TIE 12 (SUTURE) ×1 IMPLANT
SUT VIC AB 1 CTX 27 (SUTURE) ×4 IMPLANT
TOWEL GREEN STERILE (TOWEL DISPOSABLE) ×2 IMPLANT
TUBE CONNECTING 12X1/4 (SUCTIONS) ×2 IMPLANT
YANKAUER SUCT BULB TIP NO VENT (SUCTIONS) ×2 IMPLANT

## 2021-09-27 NOTE — H&P (Addendum)
Cody Pena is an 61 y.o. male.   Chief Complaint: Dehiscence transmetatarsal amputation. HPI: The patient is a 61 year old gentleman who presents today in follow-up he has had hospitalization in the interim since his last postop visit he had worsening of his transmetatarsal amputation wound dehiscence as well as infection associated with this   Is currently taking Augmentin.  His MRI was not conclusive for osteomyelitis while hospitalized.  Past Medical History:  Diagnosis Date   COVID    has had Covid 3 times, not sick with it   Depression    Diabetes mellitus without complication (HCC)    pt states he has now been told that he is a type 1 diabetic.   Hypertension    Peripheral vascular disease [facility] [address])     Past Surgical History:  Procedure Laterality Date   ABDOMINAL AORTOGRAM W/LOWER EXTREMITY N/A 08/11/2021   Procedure: ABDOMINAL AORTOGRAM W/LOWER EXTREMITY;  Surgeon: Leonie Douglas, MD;  Location: [facility];  Service: Cardiovascular;  Laterality: N/A;   AMPUTATION Left 11/15/2017   Procedure: LEFT FOOT 2ND RAY AMPUTATION;  Surgeon: Nadara Mustard, MD;  Location: [facility] OR;  Service: Orthopedics;  Laterality: Left;   AMPUTATION Right 08/25/2021   Procedure: RIGHT TRANSMETATARSAL AMPUTATION;  Surgeon: Nadara Mustard, MD;  Location: [facility] OR;  Service: Orthopedics;  Laterality: Right;   CERVICAL FUSION     IR ANGIOGRAM EXTREMITY LEFT  11/14/2017   IR ANGIOGRAM SELECTIVE EACH ADDITIONAL VESSEL  11/14/2017   IR ANGIOGRAM SELECTIVE EACH ADDITIONAL VESSEL  11/14/2017   IR TIB-PERO ART PTA MOD SED  11/14/2017   IR TIB-PERO ART UNI PTA EA ADD VESSEL MOD SED  11/14/2017   IR US GUIDE VASC ACCESS LEFT  11/14/2017   PERIPHERAL VASCULAR INTERVENTION Right 08/11/2021   Procedure: PERIPHERAL VASCULAR INTERVENTION;  Surgeon: Leonie Douglas, MD;  Location: [facility];  Service: Cardiovascular;  Laterality: Right;  rt sfa stent    History reviewed. No pertinent family history. Social  History:  reports that he has been smoking cigarettes. He has a 23.50 pack-year smoking history. He has never used smokeless tobacco. He reports that he does not drink alcohol and does not use drugs.  Allergies:  Allergies  Allergen Reactions   Ceftriaxone Itching, Nausea And Vomiting and Rash    Noted minutes after starting medication    No medications prior to admission.    No results found for this or any previous visit (from the past 48 hour(s)). No results found.  Review of Systems  All other systems reviewed and are negative.  There were no vitals taken for this visit. Physical Exam  On examination of the right foot there is dehiscence of the transmetatarsal amputation medially.  There is mild surrounding erythema no significant edema no significant drainage.  There is exposed bone with purulent drainage.   Assessment/Plan Assessment: Dehiscence transmetatarsal amputation on the right.  With progressive cellulitis purulence and necrosis.  Plan: Patient has progressive cellulitis that extends to the mid tibia.  There is purulent drainage from the foot with exposed bone.  Due to the progression of the infection patient wishes to proceed with a transtibial amputation.  Nadara Mustard, MD 09/27/2021, 6:50 AM

## 2021-09-27 NOTE — Plan of Care (Signed)

## 2021-09-27 NOTE — Op Note (Signed)
° °  Date of Surgery: 09/27/2021  INDICATIONS: Cody Pena is a 61 y.o.-year-old male who Has dehiscence and abscess right TMA.  PREOPERATIVE DIAGNOSIS: Abscess dehiscence right mid foot amputation  POSTOPERATIVE DIAGNOSIS: Same.  PROCEDURE: Transtibial amputation Application of Prevena wound VAC  SURGEON: Lajoyce Corners, M.D.  ANESTHESIA:  general  IV FLUIDS AND URINE: See anesthesia records.  ESTIMATED BLOOD LOSS: See anesthesia records.  COMPLICATIONS: None.  DESCRIPTION OF PROCEDURE: The patient was brought to the operating room after undergoing regional anesthetic. After adequate levels of anesthesia were obtained patient's lower extremity was prepped using DuraPrep draped into a sterile field. A timeout was called. The foot was draped out of the sterile field with impervious stockinette. A transverse incision was made 12 cm distal to the tibial tubercle. This curved proximally and a large posterior flap was created. The tibia was transected 1 cm proximal to the skin incision. The fibula was transected just proximal to the tibial incision. The tibia was beveled anteriorly. A large posterior flap was created. The sciatic nerve was pulled cut and allowed to retract. The vascular bundles were suture ligated with 2-0 silk. The deep and superficial fascial layers were closed using #1 Vicryl. The skin was closed using staples and 2-0 nylon. The wound was covered with a Prevena customizable and arthroform wound VAC.  The dressing was sealed with dermatac there was a good suction fit. A prosthetic shrinker and limb protector were applied. Patient was taken to the PACU in stable condition.   DISCHARGE PLANNING:  Antibiotic duration: 24 hours  Weightbearing: Nonweightbearing on the operative extremity  Pain medication: Opioid pathway  Dressing care/ Wound VAC: Continue wound VAC for 1 week after discharge  Discharge to: Discharge planning based on therapy's recommendations for possible inpatient  rehabilitation, outpatient rehabilitation, or discharge to home with therapy  Follow-up: In the office 1 week post operative.  Aldean Baker, MD [facility] 10:36 AM

## 2021-09-27 NOTE — Anesthesia Postprocedure Evaluation (Signed)
Anesthesia Post Note  Patient: Cody Pena  Procedure(s) Performed: BELOW KNEE AMPUTATION (Right: Knee)     Patient location during evaluation: PACU Anesthesia Type: General Level of consciousness: awake and alert and oriented Pain management: pain level controlled Vital Signs Assessment: post-procedure vital signs reviewed and stable Respiratory status: spontaneous breathing, nonlabored ventilation and respiratory function stable Cardiovascular status: blood pressure returned to baseline and stable Postop Assessment: no apparent nausea or vomiting Anesthetic complications: no   No notable events documented.  Last Vitals:  Vitals:   09/27/21 1100 09/27/21 1115  BP: 99/63 110/68  Pulse: 78 78  Resp: 20 19  Temp:    SpO2: 92% 92%    Last Pain:  Vitals:   09/27/21 1100  TempSrc:   PainSc: 0-No pain                 Agostino Gorin A.

## 2021-09-27 NOTE — Anesthesia Preprocedure Evaluation (Addendum)
Anesthesia Evaluation  Patient identified by MRN, date of birth, ID band Patient awake    Reviewed: Allergy & Precautions, NPO status , Patient's Chart, lab work & pertinent test results  Airway Mallampati: II       Dental  (+) Poor Dentition, Missing, Loose, Dental Advisory Given   Pulmonary Current Smoker,    Pulmonary exam normal breath sounds clear to auscultation       Cardiovascular hypertension, + Peripheral Vascular Disease  Normal cardiovascular exam Rhythm:Regular Rate:Normal     Neuro/Psych PSYCHIATRIC DISORDERS Depression Diabetic peripheral neuropathy  Neuromuscular disease    GI/Hepatic negative GI ROS, Neg liver ROS,   Endo/Other  diabetes, Poorly Controlled, Type 2, Oral Hypoglycemic Agents, Insulin DependentHyperlipidemia  Renal/GU negative Renal ROS  negative genitourinary   Musculoskeletal  (+) Arthritis , Osteoarthritis,  Osteomyelitis right foot S/P ray amputation left foot   Abdominal   Peds  Hematology  (+) Blood dyscrasia, anemia , Plavix therapy- last dose   Anesthesia Other Findings   Reproductive/Obstetrics                            Anesthesia Physical Anesthesia Plan  ASA: 3  Anesthesia Plan: General   Post-op Pain Management: Regional block*   Induction: Intravenous  PONV Risk Score and Plan: 2 and Treatment may vary due to age or medical condition, Ondansetron and Midazolam  Airway Management Planned: LMA  Additional Equipment:   Intra-op Plan:   Post-operative Plan: Extubation in OR  Informed Consent: I have reviewed the patients History and Physical, chart, labs and discussed the procedure including the risks, benefits and alternatives for the proposed anesthesia with the patient or authorized representative who has indicated his/her understanding and acceptance.     Dental advisory given  Plan Discussed with: CRNA and  Anesthesiologist  Anesthesia Plan Comments:        Anesthesia Quick Evaluation

## 2021-09-27 NOTE — Anesthesia Procedure Notes (Signed)
Anesthesia Regional Block: Popliteal block   Pre-Anesthetic Checklist: , timeout performed,  Correct Patient, Correct Site, Correct Laterality,  Correct Procedure, Correct Position, site marked,  Risks and benefits discussed,  Surgical consent,  Pre-op evaluation,  At surgeon's request and post-op pain management  Laterality: Right  Prep: chloraprep       Needles:  Injection technique: Single-shot  Needle Type: Echogenic Stimulator Needle     Needle Length: 10cm  Needle Gauge: 21   Needle insertion depth: 6 cm   Additional Needles:   Procedures:,,,, ultrasound used (permanent image in chart),,    Narrative:  Start time: 09/27/2021 9:15 AM End time: 09/27/2021 9:20 AM Injection made incrementally with aspirations every 5 mL.  Performed by: Personally  Anesthesiologist: Mal Amabile, MD  Additional Notes: Timeout performed. Patient sedated. Relevant anatomy ID'd using [place]. Incremental 2-73ml injection of LA with frequent aspiration. Patient tolerated procedure well.    Right Popliteal Block

## 2021-09-27 NOTE — Progress Notes (Signed)
Dr. Lajoyce Corners aware pt took Plavix and aspirin home doses last night. OK to proceed.

## 2021-09-27 NOTE — Anesthesia Procedure Notes (Addendum)
Anesthesia Regional Block: Adductor canal block   Pre-Anesthetic Checklist: , timeout performed,  Correct Patient, Correct Site, Correct Laterality,  Correct Procedure, Correct Position, site marked,  Risks and benefits discussed,  Surgical consent,  Pre-op evaluation,  At surgeon's request and post-op pain management  Laterality: Right  Prep: chloraprep       Needles:  Injection technique: Single-shot  Needle Type: Echogenic Stimulator Needle     Needle Length: 10cm  Needle Gauge: 21     Additional Needles:   Procedures:,,,, ultrasound used (permanent image in chart),,    Narrative:  Start time: 09/27/2021 9:20 AM End time: 09/27/2021 9:25 AM Injection made incrementally with aspirations every 5 mL.  Performed by: Personally  Anesthesiologist: Mal Amabile, MD  Additional Notes: Timeout performed. Patient sedated. Relevant anatomy ID'd using [place]. Incremental 2-61ml injection of LA with frequent aspiration. Patient tolerated procedure well.    Right Adductor Canal Block

## 2021-09-27 NOTE — Progress Notes (Signed)
Orthopedic Tech Progress Note Patient Details:  Cody Pena April 08, 1961 [id number] Vive Protocol BK has been ordered from [facility]  Patient ID: Cody Pena, male   DOB: March 12, 1961, 62 y.o.   MRN: [id number]  Cody Pena 09/27/2021, 10:25 AM

## 2021-09-27 NOTE — Progress Notes (Signed)
Inpatient Rehab Admissions Coordinator:   CIR consult received. I await PT/OT notes in order to determine CIR candidacy.   Clemens Catholic, [place], [place] Admissions Coordinator  [phone] ([place]) [phone] (office)

## 2021-09-27 NOTE — Anesthesia Procedure Notes (Signed)
Procedure Name: LMA Insertion Date/Time: 09/27/2021 9:51 AM Performed by: Kara Mead, CRNA Pre-anesthesia Checklist: Patient identified, Emergency Drugs available, Suction available, Patient being monitored and Timeout performed Patient Re-evaluated:Patient Re-evaluated prior to induction Oxygen Delivery Method: Circle system utilized Preoxygenation: Pre-oxygenation with 100% oxygen Induction Type: IV induction Ventilation: Mask ventilation without difficulty LMA: LMA inserted LMA Size: 3.0 and 5.0 Number of attempts: 1 Tube secured with: Tape Dental Injury: Teeth and Oropharynx as per pre-operative assessment

## 2021-09-27 NOTE — Transfer of Care (Signed)
Immediate Anesthesia Transfer of Care Note  Patient: MOMIN PIENKOWSKI  Procedure(s) Performed: BELOW KNEE AMPUTATION (Right: Knee)  Patient Location: PACU  Anesthesia Type:General and Regional  Level of Consciousness: drowsy  Airway & Oxygen Therapy: Patient Spontanous Breathing and Patient connected to face mask oxygen  Post-op Assessment: Report given to RN and Post -op Vital signs reviewed and stable  Post vital signs: Reviewed and stable  Last Vitals:  Vitals Value Taken Time  BP 98/54 09/27/21 1029  Temp    Pulse 72 09/27/21 1030  Resp    SpO2 100 % 09/27/21 1030  Vitals shown include unvalidated device data.  Last Pain:  Vitals:   09/27/21 0735  TempSrc: Oral         Complications: No notable events documented.

## 2021-09-28 ENCOUNTER — Encounter (HOSPITAL_COMMUNITY): Payer: Self-pay | Admitting: Orthopedic Surgery

## 2021-09-28 ENCOUNTER — Ambulatory Visit: Payer: 59 | Admitting: Student

## 2021-09-28 LAB — GLUCOSE, CAPILLARY
Glucose-Capillary: 209 mg/dL — ABNORMAL HIGH (ref 70–99)
Glucose-Capillary: 216 mg/dL — ABNORMAL HIGH (ref 70–99)
Glucose-Capillary: 126 mg/dL — ABNORMAL HIGH (ref 70–99)
Glucose-Capillary: 151 mg/dL — ABNORMAL HIGH (ref 70–99)

## 2021-09-28 LAB — CBC
HCT: 31.2 % — ABNORMAL LOW (ref 39.0–52.0)
Hemoglobin: 10.5 g/dL — ABNORMAL LOW (ref 13.0–17.0)
MCH: 31.3 pg (ref 26.0–34.0)
MCHC: 33.7 g/dL (ref 30.0–36.0)
MCV: 93.1 fL (ref 80.0–100.0)
Platelets: 247 10*3/uL (ref 150–400)
RBC: 3.35 MIL/uL — ABNORMAL LOW (ref 4.22–5.81)
RDW: 13.1 % (ref 11.5–15.5)
WBC: 14.6 10*3/uL — ABNORMAL HIGH (ref 4.0–10.5)
nRBC: 0 % (ref 0.0–0.2)

## 2021-09-28 MED ORDER — ADULT MULTIVITAMIN W/MINERALS CH
1.0000 | ORAL_TABLET | Freq: Every day | ORAL | Status: DC
  Administered 2021-09-28 – 2021-09-30 (×3): 1 via ORAL
  Filled 2021-09-28 (×3): qty 1

## 2021-09-28 MED ORDER — METHOCARBAMOL 500 MG PO TABS
500.0000 mg | ORAL_TABLET | Freq: Four times a day (QID) | ORAL | Status: DC | PRN
Start: 2021-09-28 — End: 2021-09-30
  Administered 2021-09-28 – 2021-09-30 (×4): 500 mg via ORAL
  Filled 2021-09-28 (×4): qty 1

## 2021-09-28 MED ORDER — KETOROLAC TROMETHAMINE 15 MG/ML IJ SOLN
15.0000 mg | Freq: Four times a day (QID) | INTRAMUSCULAR | Status: AC
  Administered 2021-09-28 – 2021-09-29 (×5): 15 mg via INTRAVENOUS
  Filled 2021-09-28 (×5): qty 1

## 2021-09-28 MED ORDER — HYDROMORPHONE HCL 1 MG/ML IJ SOLN
0.5000 mg | INTRAMUSCULAR | Status: DC | PRN
  Administered 2021-09-28 – 2021-09-30 (×8): 1 mg via INTRAVENOUS
  Filled 2021-09-28 (×10): qty 1

## 2021-09-28 MED ORDER — ENSURE MAX PROTEIN PO LIQD
11.0000 [oz_av] | Freq: Two times a day (BID) | ORAL | Status: DC
  Administered 2021-09-28 – 2021-09-30 (×4): 11 [oz_av] via ORAL

## 2021-09-28 NOTE — Progress Notes (Signed)
Pt wound vacuum defaulted on low battery. Secretary ordered new one and informed day nurse to connect it upon arrival.

## 2021-09-28 NOTE — Progress Notes (Signed)
IP rehab admissions - I met with patient at the bedside.  I gave him rehab booklets to review.  Patient hopes to be able to discharge directly home, but he is open to CIR if needed.  I did review has benefits with him.  I will have my partner follow up tomorrow after PT/OT evaluations and recommendations are completed to see what rehab venue is needed.  Call for questions.  [phone]

## 2021-09-28 NOTE — Plan of Care (Signed)

## 2021-09-28 NOTE — Evaluation (Signed)
Occupational Therapy Evaluation Patient Details Name: Cody Pena MRN: [id number] DOB: 1960/08/24 Today's Date: 09/28/2021   History of Present Illness 61 y/o male presented to ED on 09/27/21 for dehiscense, purulence and exposed bone on R transmet amputation, now surgically revised to BK amp.  PMHx:  Covid, depression, uncontrolled DM, HTN, PVD, cervical spine fusion, peripheral neuropathy, OA,   Clinical Impression   Pt admitted with above. He demonstrates the below listed deficits and will benefit from continued OT to maximize safety and independence with BADLs.  Pt presents to OT with generalized weakness, decreased activity tolerance, increased pain.  He currently requires mod - max A for LB ADLs, and min guard assist for functional transfers.  He is limited this date by 9/10 pain.  He lives with his wife and was mod I for ADLs PTA.  Recommend AIR currently, but anticipate as his pain improves, he may be able to discharge home.         Recommendations for follow up therapy are one component of a multi-disciplinary discharge planning process, led by the attending physician.  Recommendations may be updated based on patient status, additional functional criteria and insurance authorization.   Follow Up Recommendations  Acute inpatient rehab (3hours/day)    Assistance Recommended at Discharge Intermittent Supervision/Assistance  Patient can return home with the following A little help with walking and/or transfers;A little help with bathing/dressing/bathroom;Assistance with cooking/housework;Assist for transportation;Help with stairs or ramp for entrance    Functional Status Assessment  Patient has had a recent decline in their functional status and demonstrates the ability to make significant improvements in function in a reasonable and predictable amount of time.  Equipment Recommendations  Tub/shower bench    Recommendations for Other Services Rehab consult     Precautions /  Restrictions Precautions Precautions: Fall Precaution Comments: wound vac on RLE Required Braces or Orthoses: Other Brace Other Brace: RLE limb protector Restrictions Weight Bearing Restrictions: Yes LLE Weight Bearing: Weight bearing as tolerated Other Position/Activity Restrictions: RLE NWB      Mobility Bed Mobility Overal bed mobility: Modified Independent                  Transfers                          Balance Overall balance assessment: Needs assistance Sitting-balance support: Feet supported, No upper extremity supported Sitting balance-[name]: Fair Sitting balance - Comments: pt able to maintain static sitting while awake                                   ADL either performed or assessed with clinical judgement   ADL Overall ADL's : Needs assistance/impaired Eating/Feeding: Independent   Grooming: Wash/dry hands;Wash/dry face;Oral care;Brushing hair;Set up;Sitting   Upper Body Bathing: Set up;Sitting   Lower Body Bathing: Moderate assistance;Sit to/from stand   Upper Body Dressing : Set up;Supervision/safety;Sitting   Lower Body Dressing: Maximal assistance;Sit to/from stand   Toilet Transfer: Min guard;Squat-pivot;BSC/3in1   Toileting- Water quality scientist and Hygiene: Maximal assistance;Sit to/from stand       Functional mobility during ADLs: Min guard General ADL Comments: activity limited by pain     Vision Patient Visual Report: No change from baseline       Perception     Praxis      Pertinent Vitals/Pain Pain Assessment Pain Assessment: 0-10 Pain Score: 9  Pain Location: RLE Pain Descriptors / Indicators: Guarding, Grimacing, Tender Pain Intervention(s): Monitored during session, Limited activity within patient's tolerance, Repositioned, Patient requesting pain meds-RN notified     Hand Dominance Right   Extremity/Trunk Assessment Upper Extremity Assessment Upper Extremity Assessment:  Overall WFL for tasks assessed   Lower Extremity Assessment Lower Extremity Assessment: Defer to PT evaluation RLE Deficits / Details: new R BK amputation RLE: Unable to fully assess due to pain RLE Coordination: decreased gross motor   Cervical / Trunk Assessment Cervical / Trunk Assessment: Normal   Communication Communication Communication: No difficulties   Cognition Arousal/Alertness: Lethargic Behavior During Therapy: WFL for tasks assessed/performed Overall Cognitive Status: Impaired/Different from baseline                                 General Comments: Pt follows commands consistently with cues, but demonstrates focused to sustained attention due to lethargy.  Wife reports pt not at baseline - likely due to meds     General Comments  wife present initially    Exercises     Shoulder Instructions      Home Living Family/patient expects to be discharged to:: Inpatient rehab Living Arrangements: Spouse/significant other Available Help at Discharge: Family;Available 24 hours/day Type of Home: House Home Access: Stairs to enter [employer] of Steps: 4 (Pt reports a ramp is being constructed) Entrance Stairs-Rails: None Home Layout: One level     Bathroom Shower/Tub: Tub/shower unit;Walk-in shower   Bathroom Toilet: Standard     Home Equipment: Conservation officer, nature (2 wheels);Cane - single point;BSC/3in1   Additional Comments: heel walking on RLE before his surgery      Prior Functioning/Environment Prior Level of Function : Independent/Modified Independent             Mobility Comments: used SPC or RW for maintenance depending on wb recommendations for RLE ADLs Comments: Pt and wife report he was mod I for ADLs        OT Problem List: Decreased activity tolerance;Impaired balance (sitting and/or standing);Decreased safety awareness;Decreased knowledge of precautions;Pain      OT Treatment/Interventions: Self-care/ADL  training;DME and/or AE instruction;Therapeutic activities;Patient/family education;Balance training    OT Goals(Current goals can be found in the care plan section) Acute Rehab OT Goals Patient Stated Goal: to have less pain OT Goal Formulation: With patient Time For Goal Achievement: 10/12/21 Potential to Achieve Goals: Good ADL Goals Pt Will Perform Grooming: with modified independence;standing Pt Will Perform Lower Body Bathing: with modified independence;sit to/from stand Pt Will Perform Lower Body Dressing: with modified independence;sit to/from stand Pt Will Transfer to Toilet: with modified independence;ambulating;regular height toilet;stand pivot transfer;bedside commode;grab bars Pt Will Perform Toileting - Clothing Manipulation and hygiene: with modified independence;sit to/from stand Pt Will Perform Tub/Shower Transfer: Tub transfer;with supervision;ambulating;Stand pivot transfer;tub bench;rolling walker  OT Frequency: Min 2X/week    Co-evaluation              AM-PAC OT "6 Clicks" Daily Activity     Outcome Measure Help from another person eating meals?: None Help from another person taking care of personal grooming?: A Little Help from another person toileting, which includes using toliet, bedpan, or urinal?: A Lot Help from another person bathing (including washing, rinsing, drying)?: A Lot Help from another person to put on and taking off regular upper body clothing?: A Little Help from another person to put on and taking off regular lower body clothing?: A  Lot 6 Click Score: 16   End of Session Equipment Utilized During Treatment: Gait belt Nurse Communication: Mobility status  Activity Tolerance: Patient limited by pain Patient left: in bed;with call bell/phone within reach;with bed alarm set  OT Visit Diagnosis: Unsteadiness on feet (R26.81);Pain Pain - Right/Left: Right Pain - part of body: Leg                Time: [id number] OT Time Calculation (min): 18  min Charges:  OT General Charges $OT Visit: 1 Visit OT Evaluation $OT Eval Moderate Complexity: 1 Mod  Nilsa Nutting., OTR/L Acute Rehabilitation Services Pager [phone] Office [phone]   Lucille Passy M 09/28/2021, 4:45 PM

## 2021-09-28 NOTE — Progress Notes (Signed)
Inpatient Diabetes Program Recommendations  AACE/ADA: New Consensus Statement on Inpatient Glycemic Control (2015)  Target Ranges:  Prepandial:   less than 140 mg/dL      Peak postprandial:   less than 180 mg/dL (1-2 hours)      Critically ill patients:  140 - 180 mg/dL   Lab Results  Component Value Date   GLUCAP 209 (H) 09/28/2021   HGBA1C 9.2 (H) 09/27/2021    Review of Glycemic Control  Latest Reference Range & Units 09/27/21 07:37 09/27/21 10:30 09/27/21 16:00 09/27/21 20:19 09/28/21 07:37  Glucose-Capillary 70 - 99 mg/dL 254 (H) 205 (H) 186 (H) 198 (H) 209 (H)   Diabetes history: DM 2 Outpatient Diabetes medications: Basaglar 24 units qhs, Humalog 0-15 units tid, Metformin 500 mg bid Current orders for Inpatient glycemic control:  Semglee 20 units qhs Metformin 500 mg  Novolog 0-15 units tid  Novolog 4 units tid meal coverage  A1c 9.2% on 09/27/21  Inpatient Diabetes Program Recommendations:    -   Consider increasing Semglee to home dose 24 units.  Thanks,  Tama Headings RN, MSN, BC-ADM Inpatient Diabetes Coordinator Team Pager [phone] (8a-5p)

## 2021-09-28 NOTE — Progress Notes (Signed)
Patient ID: Cody Pena, male   DOB: [birth year], 61 y.o.   MRN: [id number] Patient is a 61 year old gentleman who is postop day 1 transtibial amputation on the right.  Patient's wound VAC has not been plugged and and is not working.  I plugged the wound VAC in and it will not function at this time I have requested a new wound VAC pump.  Patient is having increased pain possibly secondary to the wound VAC not working.  I will increase Dilaudid to every 2 hours and ordered Robaxin.

## 2021-09-28 NOTE — PMR Pre-admission (Signed)
PMR Admission Coordinator Pre-Admission Assessment °  °Patient: Cody Pena is an 60 y.o., male °MRN: [id number] °DOB: 04/29/1961 °Height: 6' 4" (193 cm) °Weight: 100.6 kg °  °Insurance Information °HMO: Yes    PPO:       PCP:       IPA:       80/20:       OTHER: Group [id number] °PRIMARY: [employer] choice plus      Policy#: [id number]      Subscriber: patient °CM Name: Bellejoy Mendoza      Phone#: [phone]     Fax#: [phone] °Pre-Cert#: [id number]      Employer: [employer] I received authorization from Jon at [employer] on 09/29/20 for admission 09/30/21 for 6 days. Updates due 2/24.  °Benefits:  Phone #: [phone]     Name: [email or web address] - online °Eff. Date: 08/13/21     Deduct: $2500 (met $2500)      Out of Pocket Max: $9100 (met $9100)      Life Max: N/A °CIR: 60% coverage, 40% co-insurance      SNF: 60% coverage, 40% co-insurance limited to 60 days/yr °Outpatient: 60%     Co-Pay: 40% °Home Health: 60%      Co-Pay: 40% °DME: 60%     Co-Pay: 40% °Providers: in network °  °SECONDARY:        Policy#:       Phone#:   °  °Financial Counselor:        Phone#:   °  °The “Data Collection Information Summary” for patients in Inpatient Rehabilitation Facilities with attached “Privacy Act Statement-Health Care Records” was provided and verbally reviewed with: N/A °  °Emergency Contact Information °Contact Information   °  °  Name Relation Home Work Mobile  °  Lahey,JANE Spouse     [phone]  °  Kading,[name]     [phone]  °  °   °  °  °Current Medical History  °Patient Admitting Diagnosis: R BKA °  °History of Present Illness: The patient is a 60-year-old gentleman  with past medical history of Covid, depression, uncontrolled DM, HTN, PVD, cervical spine fusion, peripheral neuropathy, OA. who presented to the ED 09/27/21  due to worsening of wound from his recent transmetatarsal amputation.  He  reported that he completed his 2-week course of Doxy 2/3, but presented to ED  due to chills,  reported hallucinations, subjective fever. In ED vitals stable and no concern for sepsis. Labs notable for normal WBC at 9.3, Hgb 12.1, lactic acid 1.7. Right foot x-ray with soft tissue gas adjacent to first metatarsal stump suggesting infection but no bony abnormalities. Received a dose of Ancef and Vanc in the ED. Pain control with Morphine in ED. Ortho was consulted in the ED, recommended MRI and admission for further workup and management of wound..  Pt.  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 Duda. 09/27/21 CIR was consulted to assist return to PLOF.  °  °Patient's medical record from [facility] has been reviewed by the rehabilitation admission coordinator and physician. °  °Past Medical History  °    °Past Medical History:  °Diagnosis Date  ° COVID    °  has had Covid 3 times, not sick with it  ° Depression    ° Diabetes mellitus without complication (HCC)    °  pt states he has now been told that he is a type 1 diabetic.  °   Hypertension    ° Peripheral vascular disease (HCC)    °  °  °Has the patient had major surgery during 100 days prior to admission? Yes °  °Family History   °family history is not on file. °  °Current Medications °  °Current Facility-Administered Medications:  °  0.9 %  sodium chloride infusion, , Intravenous, Continuous, Duda, Marcus V, MD, Last Rate: 75 mL/hr at 09/27/21 1404, New Bag at 09/27/21 1404 °  acetaminophen (TYLENOL) tablet 325-650 mg, 325-650 mg, Oral, Q6H PRN, Duda, Marcus V, MD, 650 mg at 09/30/21 0126 °  albuterol (PROVENTIL) (2.5 MG/3ML) 0.083% nebulizer solution 3 mL, 3 mL, Inhalation, Q6H PRN, Duda, Marcus V, MD °  alum & mag hydroxide-simeth (MAALOX/MYLANTA) 200-200-20 MG/5ML suspension 15-30 mL, 15-30 mL, Oral, Q2H PRN, Duda, Marcus V, MD °  ascorbic acid (VITAMIN C) tablet 1,000 mg, 1,000 mg, Oral, Daily, Duda, Marcus V, MD, 1,000 mg at 09/30/21 0839 °  aspirin EC tablet 81 mg, 81 mg, Oral, Daily, Duda, Marcus V, MD, 81 mg at  09/30/21 0839 °  atorvastatin (LIPITOR) tablet 80 mg, 80 mg, Oral, Daily, Duda, Marcus V, MD, 80 mg at 09/30/21 0839 °  bisacodyl (DULCOLAX) EC tablet 5 mg, 5 mg, Oral, Daily PRN, Duda, Marcus V, MD °  citalopram (CELEXA) tablet 20 mg, 20 mg, Oral, Daily, Duda, Marcus V, MD, 20 mg at 09/30/21 0840 °  clonazePAM (KLONOPIN) tablet 0.5 mg, 0.5 mg, Oral, TID, Duda, Marcus V, MD, 0.5 mg at 09/30/21 0839 °  clopidogrel (PLAVIX) tablet 75 mg, 75 mg, Oral, Daily, Duda, Marcus V, MD, 75 mg at 09/30/21 0840 °  docusate sodium (COLACE) capsule 100 mg, 100 mg, Oral, Daily, Duda, Marcus V, MD, 100 mg at 09/30/21 0839 °  gabapentin (NEURONTIN) tablet 600 mg, 600 mg, Oral, TID, Duda, Marcus V, MD, 600 mg at 09/30/21 0839 °  guaiFENesin-dextromethorphan (ROBITUSSIN DM) 100-10 MG/5ML syrup 15 mL, 15 mL, Oral, Q4H PRN, Duda, Marcus V, MD °  hydrALAZINE (APRESOLINE) injection 5 mg, 5 mg, Intravenous, Q20 Min PRN, Duda, Marcus V, MD °  HYDROmorphone (DILAUDID) injection 0.5-1 mg, 0.5-1 mg, Intravenous, Q2H PRN, Duda, Marcus V, MD, 1 mg at 09/30/21 0149 °  insulin aspart (novoLOG) injection 0-15 Units, 0-15 Units, Subcutaneous, TID WC, Duda, Marcus V, MD, 2 Units at 09/30/21 0843 °  insulin aspart (novoLOG) injection 4 Units, 4 Units, Subcutaneous, TID WC, Duda, Marcus V, MD, 4 Units at 09/30/21 0844 °  insulin glargine-yfgn (SEMGLEE) injection 20 Units, 20 Units, Subcutaneous, QHS, Duda, Marcus V, MD, 20 Units at 09/29/21 2139 °  labetalol (NORMODYNE) injection 10 mg, 10 mg, Intravenous, Q10 min PRN, Duda, Marcus V, MD °  magnesium sulfate IVPB 2 g 50 mL, 2 g, Intravenous, Daily PRN, Duda, Marcus V, MD °  metFORMIN (GLUCOPHAGE) tablet 500 mg, 500 mg, Oral, BID WC, Duda, Marcus V, MD, 500 mg at 09/30/21 0840 °  methocarbamol (ROBAXIN) tablet 500 mg, 500 mg, Oral, Q6H PRN, Duda, Marcus V, MD, 500 mg at 09/30/21 0126 °  metoprolol tartrate (LOPRESSOR) injection 2-5 mg, 2-5 mg, Intravenous, Q2H PRN, Duda, Marcus V, MD °  multivitamin with  minerals tablet 1 tablet, 1 tablet, Oral, Daily, Duda, Marcus V, MD, 1 tablet at 09/30/21 0840 °  nicotine (NICODERM CQ - dosed in mg/24 hours) patch 21 mg, 21 mg, Transdermal, Daily, Duda, Marcus V, MD, 21 mg at 09/30/21 0845 °  nutrition supplement (JUVEN) (JUVEN) powder packet 1 packet, 1 packet, Oral, BID BM,   Duda, Marcus V, MD, 1 packet at 09/30/21 0840 °  ondansetron (ZOFRAN) injection 4 mg, 4 mg, Intravenous, Q6H PRN, Duda, Marcus V, MD °  oxyCODONE (Oxy IR/ROXICODONE) immediate release tablet 10-15 mg, 10-15 mg, Oral, Q4H PRN, Duda, Marcus V, MD, 15 mg at 09/30/21 0616 °  oxyCODONE (Oxy IR/ROXICODONE) immediate release tablet 5-10 mg, 5-10 mg, Oral, Q4H PRN, Duda, Marcus V, MD °  pantoprazole (PROTONIX) EC tablet 40 mg, 40 mg, Oral, Daily, Duda, Marcus V, MD, 40 mg at 09/30/21 0839 °  phenol (CHLORASEPTIC) mouth spray 1 spray, 1 spray, Mouth/Throat, PRN, Duda, Marcus V, MD °  polyethylene glycol (MIRALAX / GLYCOLAX) packet 17 g, 17 g, Oral, Daily PRN, Duda, Marcus V, MD °  potassium chloride SA (KLOR-CON M) CR tablet 20-40 mEq, 20-40 mEq, Oral, Daily PRN, Duda, Marcus V, MD °  protein supplement (ENSURE MAX) liquid, 11 oz, Oral, BID, Duda, Marcus V, MD, 11 oz at 09/29/21 1932 °  zinc sulfate capsule 220 mg, 220 mg, Oral, Daily, Duda, Marcus V, MD, 220 mg at 09/30/21 0839 °  °Patients Current Diet:  °Diet Order   °  °         °    Diet Carb Modified Fluid consistency: Thin; Room service appropriate? Yes  Diet effective now       °  °  °   °  °  °   °  °  °Precautions / Restrictions °Precautions °Precautions: Fall °Precaution Comments: wound vac on RLE °Other Brace: RLE limb protector °Restrictions °Weight Bearing Restrictions: Yes °LLE Weight Bearing: Non weight bearing °Other Position/Activity Restrictions: RLE NWB  °  °Has the patient had 2 or more falls or a fall with injury in the past year? Yes °  °Prior Activity Level °Community (5-7x/wk): Went out daily, was working FT at [employer], was driving. °   °Prior Functional Level °Self Care: Did the patient need help bathing, dressing, using the toilet or eating? Independent °  °Indoor Mobility: Did the patient need assistance with walking from room to room (with or without device)? Independent °  °Stairs: Did the patient need assistance with internal or external stairs (with or without device)? Independent °  °Functional Cognition: Did the patient need help planning regular tasks such as shopping or remembering to take medications? Independent °  °Patient Information °Are you of Hispanic, Latino/a,or Spanish origin?: A. No, not of Hispanic, Latino/a, or Spanish origin °What is your race?: A. White °Do you need or want an interpreter to communicate with a doctor or health care staff?: 0. No °  °Patient's Response To:  °Health Literacy and Transportation °Is the patient able to respond to health literacy and transportation needs?: Yes °Health Literacy - How often do you need to have someone help you when you read instructions, pamphlets, or other written material from your doctor or pharmacy?: Never °In the past 12 months, has lack of transportation kept you from medical appointments or from getting medications?: No °In the past 12 months, has lack of transportation kept you from meetings, work, or from getting things needed for daily living?: No °  °Home Assistive Devices / Equipment °Home Assistive Devices/Equipment: CBG Meter °Home Equipment: Rolling Walker (2 wheels), Cane - single point, BSC/3in1 °  °Prior Device Use: Indicate devices/aids used by the patient prior to current illness, exacerbation or injury? None of the above °  °Current Functional Level °Cognition °  Overall Cognitive Status: Within Functional Limits for tasks assessed °Orientation Level:   Oriented X4 °General Comments: Pt follows commands consistently with cues, but demonstrates focused to sustained attention due to lethargy.  Wife reports pt not at baseline - likely due to meds °   °Extremity  Assessment °(includes Sensation/Coordination) °  Upper Extremity Assessment: Overall WFL for tasks assessed  °Lower Extremity Assessment: Defer to PT evaluation °RLE Deficits / Details: new R BK amputation °RLE: Unable to fully assess due to pain °RLE Coordination: decreased gross motor  °   °ADLs °  Overall ADL's : Needs assistance/impaired °Eating/Feeding: Independent °Grooming: Wash/dry hands, Wash/dry face, Oral care, Brushing hair, Min guard, Standing °Upper Body Bathing: Set up, Sitting °Lower Body Bathing: Moderate assistance, Sit to/from stand °Upper Body Dressing : Set up, Supervision/safety, Sitting °Lower Body Dressing: Min guard, Sit to/from stand °Toilet Transfer: Min guard, Ambulation, Comfort height toilet, Grab bars, Rolling walker (2 wheels) °Toileting- Clothing Manipulation and Hygiene: Min guard, Sit to/from stand °Functional mobility during ADLs: Min guard, Rolling walker (2 wheels) °General ADL Comments: activity limited by pain  °   °Mobility °  Overal bed mobility: Modified Independent  °   °Transfers °  Overall transfer level: Needs assistance °Equipment used: Rolling walker (2 wheels), Crutches °Transfers: Sit to/from Stand, Bed to chair/wheelchair/BSC °Sit to Stand: Min assist °Bed to/from chair/wheelchair/BSC transfer type:: Step pivot °Step pivot transfers: Min assist °General transfer comment: pt requires minA to steady during transfers, improved balance with use of RW compared to attempt with crutches  °   °Ambulation / Gait / Stairs / Wheelchair Mobility °  Ambulation/Gait °Ambulation/Gait assistance: Min guard °Gait Distance (Feet): 25 Feet (25' x 2) °Assistive device: Rolling walker (2 wheels) °Gait Pattern/deviations:  (hop-to gait) °General Gait Details: pt with slowed hop-to gait, PT with verbal cues for seuquencing turns °Gait velocity: reduced °Gait velocity interpretation: <1.8 ft/sec, indicate of risk for recurrent falls  °   °Posture / Balance Dynamic Sitting Balance °Sitting  balance - Comments: pt able to maintain static sitting while awake °Balance °Overall balance assessment: Needs assistance °Sitting-balance support: No upper extremity supported, Feet supported °Sitting balance-[name]: Good °Sitting balance - Comments: pt able to maintain static sitting while awake °Standing balance support: Reliant on assistive device for balance, Single extremity supported, During functional activity °Standing balance-[name]: Poor  °   °Special needs/care consideration Continuous Drip IV  Yes, KVO, Wound Vac Yes to right BKA stump, Skin R BKA post op incision with VAC and dressings in place, Diabetic management Yes h/o DM on oral medications and on insulin in acute care setting., and Special service needs none  °  °Previous Home Environment (from acute therapy documentation) °Living Arrangements: Spouse/significant other °Available Help at Discharge: Family, Available 24 hours/day °Type of Home: House °Home Layout: One level °Home Access: Stairs to enter °Entrance Stairs-Rails: None °Entrance Stairs-Number of Steps: 4 (Pt reports a ramp is being constructed) °Bathroom Shower/Tub: Tub/shower unit, Walk-in shower °Bathroom Toilet: Standard °Home Care Services: No °Additional Comments: heel walking on RLE before his surgery °  °Discharge Living Setting °Plans for Discharge Living Setting: Patient's home, House, Lives with (comment) (Lives with wife.) °Type of Home at Discharge: House °Discharge Home Layout: One level °Discharge Home Access: Stairs to enter °Entrance Stairs-Rails: Right, Left, Can reach both °Entrance Stairs-Number of Steps: 3 steps °Discharge Bathroom Shower/Tub: Tub/shower unit, Curtain °Discharge Bathroom Toilet: Standard °Discharge Bathroom Accessibility: Yes °How Accessible: Accessible via walker, Accessible via wheelchair °Does the patient have any problems obtaining your medications?: No °  °Social/Family/Support Systems °Patient   Roles: Spouse, Parent (Has a wife and  four daughters.) °Contact Information: Jane Costilla - wife - [phone] °Anticipated Caregiver: wife °Ability/Limitations of Caregiver: Wife does not work outside the home; can provide supervision to light assistance. °Caregiver Availability: 24/7 °Discharge Plan Discussed with Primary Caregiver: Yes °Is Caregiver In Agreement with Plan?: Yes °Does Caregiver/Family have Issues with Lodging/Transportation while Pt is in Rehab?: No °  °Goals °Patient/Family Goal for Rehab: PT/OT supervision goals °Expected length of stay: 5-7 days °Pt/Family Agrees to Admission and willing to participate: Yes °Program Orientation Provided & Reviewed with Pt/Caregiver Including Roles  & Responsibilities: Yes °  °Decrease burden of Care through IP rehab admission: N/A °  °Possible need for SNF placement upon discharge: not anticipated °  °Patient Condition: I have reviewed medical records from [facility], spoken with CM, and patient. I met with patient at the bedside for inpatient rehabilitation assessment.  Patient will benefit from ongoing PT and OT, can actively participate in 3 hours of therapy a day 5 days of the week, and can make measurable gains during the admission.  Patient will also benefit from the coordinated team approach during an Inpatient Acute Rehabilitation admission.  The patient will receive intensive therapy as well as Rehabilitation physician, nursing, social worker, and care management interventions.  Due to bladder management, bowel management, safety, skin/wound care, disease management, medication administration, pain management, and patient education the patient requires 24 hour a day rehabilitation nursing.  The patient is currently min A with mobility and basic ADLs.  Discharge setting and therapy post discharge at home with home health is anticipated.  Patient has agreed to participate in the Acute Inpatient Rehabilitation Program and will admit today. °  °Preadmission Screen Completed By:  Laura B  Staley, 09/30/2021 10:18 AM °______________________________________________________________________   °Discussed status with Dr. Barkley Kratochvil  on 09/30/21 at 1000 and received approval for admission today. °  °Admission Coordinator:  Laura B Staley, CCC-SLP, time 09/30/21/Date 1022  °  °Assessment/Plan: °Diagnosis: Right below-knee amputation for infection °Does the need for close, 24 hr/day Medical supervision in concert with the patient's rehab needs make it unreasonable for this patient to be served in a less intensive setting? Yes °Co-Morbidities requiring supervision/potential complications: Poorly controlled diabetes, hypertension, peripheral vascular disease, osteoarthritis, peripheral neuropathy associated with diabetes °Due to bladder management, bowel management, safety, skin/wound care, disease management, medication administration, pain management, and patient education, does the patient require 24 hr/day rehab nursing? Yes °Does the patient require coordinated care of a physician, rehab nurse, PT, OT, and SLP to address physical and functional deficits in the context of the above medical diagnosis(es)? Yes °Addressing deficits in the following areas: balance, endurance, locomotion, strength, transferring, bowel/bladder control, bathing, dressing, toileting, cognition, and psychosocial support °Can the patient actively participate in an intensive therapy program of at least 3 hrs of therapy 5 days a week? Yes °The potential for patient to make measurable gains while on inpatient rehab is good °Anticipated functional outcomes upon discharge from inpatient rehab: modified independent and supervision PT, modified independent and supervision OT, n/a SLP °Estimated rehab length of stay to reach the above functional goals is: 7 days °Anticipated discharge destination: Home °10. Overall Rehab/Functional Prognosis: good °  °  °MD Signature: °Hannah Crill E. Starling Jessie M.D. °[facility] °Fellow Am Acad of Phys  Med and Rehab °Diplomate Am Board of Electrodiagnostic Med °Fellow Am Board of Interventional Pain °

## 2021-09-28 NOTE — Plan of Care (Signed)

## 2021-09-28 NOTE — Evaluation (Signed)
Physical Therapy Evaluation Patient Details Name: Cody Pena MRN: [id number] DOB: June 26, 1961 Today's Date: 09/28/2021  History of Present Illness  61 y/o male presented to ED on 09/27/21 for dehiscense, purulence and exposed bone on R transmet amputation, now surgically revised to BK amp.  PMHx:  Covid, depression, uncontrolled DM, HTN, PVD, cervical spine fusion, peripheral neuropathy, OA,  Clinical Impression  Pt was seen for progression of mobility on RW with help, taking a few sidesteps and tolerating generally some limited movement.  Pt is mainly restricted by pain and is getting managed by nursing to find a good combination of meds and other ideas to manage his pain.  PT and nursing talked about the mobility quality, and will ask for CIR admission due to his effort, capability and help consistently at home.  Follow up with acute PT goals for  gait and transfers, with longer range plan for prosthetic fitting as deemed appropriate by MD.     Recommendations for follow up therapy are one component of a multi-disciplinary discharge planning process, led by the attending physician.  Recommendations may be updated based on patient status, additional functional criteria and insurance authorization.  Follow Up Recommendations Acute inpatient rehab (3hours/day)    Assistance Recommended at Discharge Frequent or constant Supervision/Assistance  Patient can return home with the following  Help with stairs or ramp for entrance;Assist for transportation;Assistance with cooking/housework;A little help with bathing/dressing/bathroom;A little help with walking and/or transfers    Equipment Recommendations Wheelchair (measurements PT);Wheelchair cushion (measurements PT)  Recommendations for Other Services  Rehab consult    Functional Status Assessment Patient has had a recent decline in their functional status and demonstrates the ability to make significant improvements in function in a  reasonable and predictable amount of time.     Precautions / Restrictions Precautions Precautions: Fall Precaution Comments: wound vac on RLE Required Braces or Orthoses: Other Brace Other Brace: RLE limb protector Restrictions Weight Bearing Restrictions: Yes LLE Weight Bearing: Weight bearing as tolerated Other Position/Activity Restrictions: RLE NWB      Mobility  Bed Mobility Overal bed mobility: Modified Independent                  Transfers Overall transfer level: Needs assistance Equipment used: Rolling walker (2 wheels), 1 person hand held assist Transfers: Sit to/from Stand Sit to Stand: Min assist, Min guard           General transfer comment: min guard once standing and min assist to power up    Ambulation/Gait Ambulation/Gait assistance: Min guard Gait Distance (Feet): 4 Feet Assistive device: Rolling walker (2 wheels), 1 person hand held assist   Gait velocity: decreased Gait velocity interpretation: <1.31 ft/sec, indicative of household ambulator   General Gait Details: pt is maintaining NWB on RLE BK, hops on LLE with RLE protected with limb protector, carefully applied  Stairs            Wheelchair Mobility    Modified Rankin (Stroke Patients Only)       Balance Overall balance assessment: Needs assistance Sitting-balance support: Single extremity supported Sitting balance-[name]: Poor Sitting balance - Comments: poro due to falling asleep and losing balance backward   Standing balance support: Bilateral upper extremity supported, Reliant on assistive device for balance Standing balance-[name]: Poor                               Pertinent Vitals/Pain Pain Assessment  Pain Assessment: 0-10 Pain Score: 9  Pain Location: RLE Pain Descriptors / Indicators: Guarding, Grimacing, Tender Pain Intervention(s): Limited activity within patient's tolerance, Monitored during session, Premedicated before session,  Repositioned, Patient requesting pain meds-RN notified    Home Living Family/patient expects to be discharged to:: Inpatient rehab Living Arrangements: Spouse/significant other Available Help at Discharge: Family;Available 24 hours/day Type of Home: House Home Access: Stairs to enter Entrance Stairs-Rails: None Entrance Stairs-Number of Steps: 4 (Pt reports a ramp is being constructed)   Home Layout: One level Home Equipment: Agricultural consultant (2 wheels);Cane - single point;BSC/3in1 Additional Comments: heel walking on RLE before his surgery    Prior Function Prior Level of Function : Independent/Modified Independent             Mobility Comments: used SPC or RW for maintenance depending on wb recommendations for RLE       Hand Dominance   Dominant Hand: Right    Extremity/Trunk Assessment   Upper Extremity Assessment Upper Extremity Assessment: Overall WFL for tasks assessed    Lower Extremity Assessment Lower Extremity Assessment: RLE deficits/detail RLE Deficits / Details: new R BK amputation RLE: Unable to fully assess due to pain RLE Coordination: decreased gross motor    Cervical / Trunk Assessment Cervical / Trunk Assessment: Normal  Communication   Communication: No difficulties  Cognition Arousal/Alertness: Lethargic Behavior During Therapy: Flat affect Overall Cognitive Status: Difficult to assess                                 General Comments: pt was falling asleep when PT arrived, noted his control of sit balance was impaired by leaning posteriorly in sitting        General Comments General comments (skin integrity, edema, etc.): pt is wearing a compression garment on R leg surgery, then applied the limb protector to reduce pain sensitivity    Exercises     Assessment/Plan    PT Assessment Patient needs continued PT services  PT Problem List Decreased strength;Decreased activity tolerance;Decreased balance;Decreased  coordination;Decreased knowledge of use of DME;Cardiopulmonary status limiting activity;Decreased skin integrity;Pain       PT Treatment Interventions DME instruction;Gait training;Functional mobility training;Therapeutic activities;Therapeutic exercise;Balance training;Neuromuscular re-education;Patient/family education    PT Goals (Current goals can be found in the Care Plan section)  Acute Rehab PT Goals Patient Stated Goal: to get to rehab and home PT Goal Formulation: With patient/family Time For Goal Achievement: 10/12/21 Potential to Achieve Goals: Good    Frequency Min 3X/week     Co-evaluation               AM-PAC PT "6 Clicks" Mobility  Outcome Measure Help needed turning from your back to your side while in a flat bed without using bedrails?: A Little Help needed moving from lying on your back to sitting on the side of a flat bed without using bedrails?: A Little Help needed moving to and from a bed to a chair (including a wheelchair)?: A Little Help needed standing up from a chair using your arms (e.g., wheelchair or bedside chair)?: A Little Help needed to walk in hospital room?: A Little Help needed climbing 3-5 steps with a railing? : Total 6 Click Score: 16    End of Session Equipment Utilized During Treatment: Gait belt Activity Tolerance: Patient limited by fatigue;Treatment limited secondary to medical complications (Comment);Patient limited by pain Patient left: in bed;with call bell/phone within reach;with  bed alarm set;with family/visitor present;with nursing/sitter in room Nurse Communication: Mobility status PT Visit Diagnosis: Unsteadiness on feet (R26.81);Muscle weakness (generalized) (M62.81);Pain Pain - Right/Left: Right Pain - part of body: Leg    Time: 1111-1200 PT Time Calculation (min) (ACUTE ONLY): 49 min   Charges:   PT Evaluation $PT Eval Moderate Complexity: 1 Mod PT Treatments $Gait Training: 8-22 mins $Therapeutic Activity:  8-22 mins       Ivar Drape 09/28/2021, 4:01 PM Samul Dada, PT PhD Acute Rehab Dept. Number: [facility] [id number] and [facility] [phone]

## 2021-09-28 NOTE — Progress Notes (Signed)
Initial Nutrition Assessment  DOCUMENTATION CODES:  Not applicable  INTERVENTION:  Continue diet as ordered.  Obtain updated adjusted weight.  Add Ensure Max po BID, each supplement provides 150 kcal and 30 grams of protein.    Continue Juven BID.  Add MVI with minerals daily.  Continue Vitamin C and zinc supplementation for healing.  Encourage PO and supplement intake.  NUTRITION DIAGNOSIS:  Increased nutrient needs related to post-op healing as evidenced by estimated needs.  GOAL:  Patient will meet greater than or equal to 90% of their needs  MONITOR:  PO intake, Supplement acceptance, Labs, Weight trends, Skin, I & O's  REASON FOR ASSESSMENT:  Malnutrition Screening Tool    ASSESSMENT:  61 yo male with a PMH of T2DM, HTN, PVD, and depression who is admitted with R transmetatarsal amputation post-operative infection. 2/15 - R BKA  Attempted to speak with pt at bedside. Pt with did not awaken to touch or voice.  Per nursing, pt with no complaints at this time.  Per Epic, pt ate 100% of breakfast this morning. Per RD observation, pt ate 100% of lunch.   Per Epic, pt's weight appears stated. RD to order measured, adjusted weight now with new R BKA.  Of note, pt with non-pitting RLE edema.  Supplements: Juven BID  Medications: reviewed; Vitamin C, colace, SSI, Semglee, metformin, Protonix, zinc sulfate, NaCl @ 75 ml/hr, oxycodone PO PRN (given twice today)  Labs: reviewed; Na 130 (L), CBG 186-254 (H) HbA1c: 9.2% (09/27/2021)  NUTRITION - FOCUSED PHYSICAL EXAM: Flowsheet Row Most Recent Value  Orbital Region Mild depletion  Upper Arm Region No depletion  Thoracic and Lumbar Region No depletion  Buccal Region Mild depletion  Temple Region Mild depletion  Clavicle Bone Region No depletion  Clavicle and Acromion Bone Region No depletion  Scapular Bone Region No depletion  Dorsal Hand No depletion  Patellar Region No depletion  Anterior Thigh Region No  depletion  Posterior Calf Region No depletion  [R BKA]  Edema (RD Assessment) Mild  Hair Reviewed  Eyes Unable to assess  Mouth Unable to assess  Skin Reviewed  Nails Reviewed   Diet Order:   Diet Order             Diet Carb Modified Fluid consistency: Thin; Room service appropriate? Yes  Diet effective now                  EDUCATION NEEDS:  No education needs have been identified at this time  Skin:  Skin Assessment: Skin Integrity Issues: Skin Integrity Issues:: Incisions Incisions: R leg, closed (2/15)  Last BM:  09/26/21  Height:  Ht Readings from Last 1 Encounters:  09/27/21 6\' 4"  (1.93 m)   Weight:  Wt Readings from Last 1 Encounters:  09/27/21 99.8 kg   BMI:  Body mass index is 26.78 kg/m.  Estimated Nutritional Needs:  Kcal:  2200-2400 Protein:  120-135 grams Fluid:  >2.2 L  09/29/21, RD, LDN (she/her/hers) Clinical Inpatient Dietitian RD Pager/After-Hours/Weekend Pager # in [place]

## 2021-09-29 LAB — CBC
HCT: 30 % — ABNORMAL LOW (ref 39.0–52.0)
Hemoglobin: 10 g/dL — ABNORMAL LOW (ref 13.0–17.0)
MCH: 31.4 pg (ref 26.0–34.0)
MCHC: 33.3 g/dL (ref 30.0–36.0)
MCV: 94.3 fL (ref 80.0–100.0)
Platelets: 214 10*3/uL (ref 150–400)
RBC: 3.18 MIL/uL — ABNORMAL LOW (ref 4.22–5.81)
RDW: 13.1 % (ref 11.5–15.5)
WBC: 12.7 10*3/uL — ABNORMAL HIGH (ref 4.0–10.5)
nRBC: 0 % (ref 0.0–0.2)

## 2021-09-29 LAB — GLUCOSE, CAPILLARY
Glucose-Capillary: 145 mg/dL — ABNORMAL HIGH (ref 70–99)
Glucose-Capillary: 173 mg/dL — ABNORMAL HIGH (ref 70–99)
Glucose-Capillary: 198 mg/dL — ABNORMAL HIGH (ref 70–99)
Glucose-Capillary: 166 mg/dL — ABNORMAL HIGH (ref 70–99)

## 2021-09-29 LAB — SURGICAL PATHOLOGY

## 2021-09-29 NOTE — Plan of Care (Signed)

## 2021-09-29 NOTE — TOC Initial Note (Signed)
Transition of Care [facility]) - Initial/Assessment Note    Patient Details  Name: Cody Pena MRN: [id number] Date of Birth: [birth year]  Transition [facility]) CM/SW Contact:    Epifanio Lesches, RN Phone Number: [phone] 09/29/2021, 10:10 AM  Clinical Narrative:                 Readmitted with abscess transmetatarsal amputation wound dehiscence. From home with supportive wife. PTA active with [facility] and [facility].      - s/p Transtibial amputation Application of Prevena wound VAC, 2/15  PT/OT  eval recommends: Acute inpatient rehab.( 3 hours/day). Inpt rehab consult pending...  TOC team following and will assist with needs.  Expected Discharge Plan: IP Rehab Facility Barriers to Discharge: Continued Medical Work up   Patient Goals and CMS Choice        Expected Discharge Plan and Services Expected Discharge Plan: IP Rehab Facility   Discharge Planning Services: CM Consult   Living arrangements for the past 2 months: Single Family Home                                      Prior Living Arrangements/Services Living arrangements for the past 2 months: Single Family Home Lives with:: Spouse Patient language and need for interpreter reviewed:: Yes Do you feel safe going back to the place where you live?: Yes      Need for Family Participation in Patient Care: Yes (Comment) Care giver support system in place?: Yes (comment) Current home services: DME (RW, Cane , 3IN1/BSC) Criminal Activity/Legal Involvement Pertinent to Current Situation/Hospitalization: No - Comment as needed  Activities of Daily Living Home Assistive Devices/Equipment: CBG Meter ADL Screening (condition at time of admission) Patient's cognitive ability adequate to safely complete daily activities?: Yes Is the patient deaf or have difficulty hearing?: No Does the patient have difficulty seeing, even when wearing glasses/contacts?: No Does the patient have  difficulty concentrating, remembering, or making decisions?: No Patient able to express need for assistance with ADLs?: Yes Does the patient have difficulty dressing or bathing?: No Independently performs ADLs?: Yes (appropriate for developmental age) Does the patient have difficulty walking or climbing stairs?: No Weakness of Legs: None Weakness of Arms/Hands: None  Permission Sought/Granted   Permission granted to share information with : Yes, Verbal Permission Granted  Share Information with NAME: General Wearing (Spouse) [phone]           Emotional Assessment Appearance:: Appears stated age Attitude/Demeanor/Rapport: Engaged Affect (typically observed): Accepting Orientation: : Oriented to Self, Oriented to Place, Oriented to  Time, Oriented to Situation Alcohol / Substance Use: Not Applicable Psych Involvement: No (comment)  Admission diagnosis:  Below-knee amputation of right lower extremity (HCC) [U13.244W] Patient Active Problem List   Diagnosis Date Noted   Below-knee amputation of right lower extremity (HCC) 09/27/2021   Dehiscence of amputation stump of right lower extremity (HCC)    Wound infection after surgery 09/16/2021   Cellulitis of right lower extremity    Peripheral vascular disease (HCC)    History of transmetatarsal amputation of right foot (HCC) 08/25/2021   Osteomyelitis (HCC) 07/24/2021   HTN (hypertension) 07/23/2021   Hyperlipidemia 07/23/2021   Diabetic foot ulcers (HCC) 07/23/2021   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) 01/14/2018   Ulcer of left foot, limited to breakdown of skin (HCC) 01/14/2018   Foot  osteomyelitis, left (HCC)    Septic arthritis of foot (HCC) 11/11/2017   Osteomyelitis of ankle or foot, acute, left (HCC) 11/11/2017   Type 2 DM with diabetic peripheral angiopathy w/o gangrene (HCC) 11/11/2017   Cigarette smoker 11/11/2017   Diabetic peripheral neuropathy (HCC) 11/11/2017   Diabetic foot  infection (HCC) 11/10/2017   Post op infection    Delirium 12/07/2014   Cervical vertebral fusion 12/07/2014   Hallucination 12/07/2014   Poorly controlled type 2 diabetes mellitus (HCC) 12/07/2014   Mood disorder (HCC) 12/07/2014   Pyrexia    Neck swelling    PCP:  Aida Puffer, MD Pharmacy:   [facility] - [place], [place] - [address] [place] [address] Phone: [phone] Fax: [phone]  [facility] [address] [place] [address] Phone: [phone] Fax: [phone]     Social Determinants of Health (SDOH) Interventions    Readmission Risk Interventions No flowsheet data found.

## 2021-09-29 NOTE — Progress Notes (Signed)
Physical Therapy Treatment Patient Details Name: Cody Pena MRN: [id number] DOB: 04/08/1961 Today's Date: 09/29/2021   History of Present Illness 61 y/o male presented to ED on 09/27/21 for dehiscense, purulence and exposed bone on R transmet amputation, now surgically revised to BK amp.  PMHx:  Covid, depression, uncontrolled DM, HTN, PVD, cervical spine fusion, peripheral neuropathy, OA,    PT Comments    Pt tolerates treamtent well, ambulating for increased distances. Pt benefits from assistance to steady during transfers and for safety with ambulation. Pt will benefit from further gait training to improve endurance and stability. Pt will also benefit from initiation of stair training as he has 3 steps to enter his home.   Recommendations for follow up therapy are one component of a multi-disciplinary discharge planning process, led by the attending physician.  Recommendations may be updated based on patient status, additional functional criteria and insurance authorization.  Follow Up Recommendations  Acute inpatient rehab (3hours/day)     Assistance Recommended at Discharge Frequent or constant Supervision/Assistance  Patient can return home with the following Help with stairs or ramp for entrance;Assist for transportation;Assistance with cooking/housework;A little help with bathing/dressing/bathroom;A little help with walking and/or transfers   Equipment Recommendations  Wheelchair (measurements PT);Wheelchair cushion (measurements PT)    Recommendations for Other Services       Precautions / Restrictions Precautions Precautions: Fall Precaution Comments: wound vac on RLE Required Braces or Orthoses: Other Brace Other Brace: RLE limb protector Restrictions Weight Bearing Restrictions: Yes LLE Weight Bearing: Non weight bearing     Mobility  Bed Mobility Overal bed mobility: Modified Independent                  Transfers Overall transfer level: Needs  assistance Equipment used: Rolling walker (2 wheels), Crutches Transfers: Sit to/from Stand, Bed to chair/wheelchair/BSC Sit to Stand: Min assist   Step pivot transfers: Min assist       General transfer comment: pt requires minA to steady during transfers, improved balance with use of RW compared to attempt with crutches    Ambulation/Gait Ambulation/Gait assistance: Min guard Gait Distance (Feet): 25 Feet (25' x 2) Assistive device: Rolling walker (2 wheels) Gait Pattern/deviations:  (hop-to gait) Gait velocity: reduced Gait velocity interpretation: <1.8 ft/sec, indicate of risk for recurrent falls   General Gait Details: pt with slowed hop-to gait, PT with verbal cues for seuquencing turns   Optometrist    Modified Rankin (Stroke Patients Only)       Balance Overall balance assessment: Needs assistance Sitting-balance support: No upper extremity supported, Feet supported Sitting balance-[name]: Good     Standing balance support: Bilateral upper extremity supported, Reliant on assistive device for balance Standing balance-[name]: Poor                              Cognition Arousal/Alertness: Awake/alert Behavior During Therapy: WFL for tasks assessed/performed Overall Cognitive Status: Within Functional Limits for tasks assessed                                          Exercises      General Comments General comments (skin integrity, edema, etc.): VSS on RA      Pertinent Vitals/Pain Pain Assessment Pain Assessment: Faces  Faces Pain Scale: Hurts little more Pain Location: RLE Pain Descriptors / Indicators: Grimacing Pain Intervention(s): Monitored during session    Home Living                          Prior Function            PT Goals (current goals can now be found in the care plan section) Acute Rehab PT Goals Patient Stated Goal: to get to rehab and  home Progress towards PT goals: Progressing toward goals    Frequency    Min 3X/week      PT Plan Current plan remains appropriate    Co-evaluation              AM-PAC PT "6 Clicks" Mobility   Outcome Measure  Help needed turning from your back to your side while in a flat bed without using bedrails?: None Help needed moving from lying on your back to sitting on the side of a flat bed without using bedrails?: None Help needed moving to and from a bed to a chair (including a wheelchair)?: A Little Help needed standing up from a chair using your arms (e.g., wheelchair or bedside chair)?: A Little Help needed to walk in hospital room?: A Little Help needed climbing 3-5 steps with a railing? : Total 6 Click Score: 18    End of Session   Activity Tolerance: Patient tolerated treatment well Patient left: in bed;with call bell/phone within reach;with bed alarm set Nurse Communication: Mobility status PT Visit Diagnosis: Unsteadiness on feet (R26.81);Muscle weakness (generalized) (M62.81);Pain Pain - Right/Left: Right Pain - part of body: Leg     Time: 1403-1440 PT Time Calculation (min) (ACUTE ONLY): 37 min  Charges:  $Gait Training: 23-37 mins                     Arlyss Gandy, PT, DPT Acute Rehabilitation Pager: [phone] Office [phone]    Arlyss Gandy 09/29/2021, 3:12 PM

## 2021-09-29 NOTE — Progress Notes (Signed)
Patient ID: Cody Pena, male   DOB: 1961-02-19, 61 y.o.   MRN: [id number] Patient is feeling much better today the wound VAC is functioning well with good suction fit.  Discussed with the patient the possibility of discharging to inpatient rehab.

## 2021-09-29 NOTE — Progress Notes (Signed)
Inpatient Rehab Admissions Coordinator:   I do not have a CIR bed for this Pt. Today. I spoke with pt. And wife regarding potential CIR admission. Pt. States he will come if insurance approves. I submitted our case to insurance this morning and will pursue for potential admission pending insurance auth.   Megan Salon, MS, CCC-SLP Rehab Admissions Coordinator  [phone] (celll) [phone] (office)

## 2021-09-29 NOTE — Progress Notes (Signed)
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  PMHx:  Covid, depression, uncontrolled DM, HTN, PVD, cervical spine fusion, peripheral neuropathy, OA,   OT comments  Pt demonstrates significant improvement with ADLs and activity tolerance this date.  He was able to perform toilet transfers and simulated LB ADLs with min guard assist.  Requires min cues for walker safety.     Recommendations for follow up therapy are one component of a multi-disciplinary discharge planning process, led by the attending physician.  Recommendations may be updated based on patient status, additional functional criteria and insurance authorization.    Follow Up Recommendations  Acute inpatient rehab (3hours/day)    Assistance Recommended at Discharge Intermittent Supervision/Assistance  Patient can return home with the following  A little help with walking and/or transfers;A little help with bathing/dressing/bathroom;Assistance with cooking/housework;Assist for transportation;Help with stairs or ramp for entrance   Equipment Recommendations  Tub/shower bench    Recommendations for Other Services Rehab consult    Precautions / Restrictions Precautions Precautions: Fall Precaution Comments: wound vac on RLE Required Braces or Orthoses: Other Brace Other Brace: RLE limb protector Restrictions Weight Bearing Restrictions: Yes LLE Weight Bearing: Non weight bearing       Mobility Bed Mobility Overal bed mobility: Modified Independent                  Transfers                         Balance Overall balance assessment: Needs assistance Sitting-balance support: No upper extremity supported, Feet supported Sitting balance-[name]:  Good     Standing balance support: Reliant on assistive device for balance, Single extremity supported, During functional activity Standing balance-[name]: Poor                             ADL either performed or assessed with clinical judgement   ADL Overall ADL's : Needs assistance/impaired     Grooming: Wash/dry hands;Wash/dry face;Oral care;Brushing hair;Min guard;Standing               Lower Body Dressing: Min guard;Sit to/from stand   Toilet Transfer: Min guard;Ambulation;Comfort height toilet;Grab bars;Rolling walker (2 wheels)   Toileting- Clothing Manipulation and Hygiene: Min guard;Sit to/from stand       Functional mobility during ADLs: Min guard;Rolling walker (2 wheels)      Extremity/Trunk Assessment Upper Extremity Assessment Upper Extremity Assessment: Overall WFL for tasks assessed   Lower Extremity Assessment Lower Extremity Assessment: Defer to PT evaluation        Vision       Perception     Praxis      Cognition Arousal/Alertness: Awake/alert Behavior During Therapy: WFL for tasks assessed/performed Overall Cognitive Status: Within Functional Limits for tasks assessed                                          Exercises      Shoulder Instructions       General Comments VSS on RA    Pertinent Vitals/ Pain       Pain  Assessment Pain Assessment: Faces Faces Pain Scale: Hurts little more Pain Location: RLE Pain Descriptors / Indicators: Grimacing Pain Intervention(s): Monitored during session  Home Living                                          Prior Functioning/Environment              Frequency  Min 2X/week        Progress Toward Goals  OT Goals(current goals can now be found in the care plan section)  Progress towards OT goals: Progressing toward goals     Plan Discharge plan remains appropriate    Co-evaluation                 AM-PAC OT "6  Clicks" Daily Activity     Outcome Measure   Help from another person eating meals?: None Help from another person taking care of personal grooming?: A Little Help from another person toileting, which includes using toliet, bedpan, or urinal?: A Little Help from another person bathing (including washing, rinsing, drying)?: A Little Help from another person to put on and taking off regular upper body clothing?: A Little Help from another person to put on and taking off regular lower body clothing?: A Little 6 Click Score: 19    End of Session Equipment Utilized During Treatment: Gait belt;Rolling walker (2 wheels)  OT Visit Diagnosis: Unsteadiness on feet (R26.81);Pain Pain - Right/Left: Right Pain - part of body: Leg   Activity Tolerance Patient tolerated treatment well   Patient Left in bed;with call bell/phone within reach;with family/visitor present   Nurse Communication Mobility status        Time: 1705-1730 OT Time Calculation (min): 25 min  Charges: OT General Charges $OT Visit: 1 Visit OT Treatments $Self Care/Home Management : 8-22 mins $Therapeutic Activity: 8-22 mins  Eber Jones., OTR/L Acute Rehabilitation Services Pager [phone] Office [phone]   Jeani Hawking M 09/29/2021, 6:09 PM

## 2021-09-30 ENCOUNTER — Inpatient Hospital Stay (HOSPITAL_COMMUNITY)
Admission: RE | Admit: 2021-09-30 | Discharge: 2021-10-06 | DRG: 560 | Disposition: A | Discharge status: Home / Self Care | Payer: 59 | Source: Intra-hospital | Attending: Physical Medicine and Rehabilitation | Admitting: Physical Medicine and Rehabilitation

## 2021-09-30 ENCOUNTER — Encounter (HOSPITAL_COMMUNITY): Payer: Self-pay

## 2021-09-30 DIAGNOSIS — S88111A Complete traumatic amputation at level between knee and ankle, right lower leg, initial encounter: Principal | ICD-10-CM | POA: Diagnosis present

## 2021-09-30 DIAGNOSIS — K5903 Drug induced constipation: Secondary | ICD-10-CM | POA: Diagnosis present

## 2021-09-30 DIAGNOSIS — L299 Pruritus, unspecified: Secondary | ICD-10-CM | POA: Diagnosis not present

## 2021-09-30 DIAGNOSIS — E1051 Type 1 diabetes mellitus with diabetic peripheral angiopathy without gangrene: Secondary | ICD-10-CM | POA: Diagnosis present

## 2021-09-30 DIAGNOSIS — Z7982 Long term (current) use of aspirin: Secondary | ICD-10-CM | POA: Diagnosis not present

## 2021-09-30 DIAGNOSIS — E8809 Other disorders of plasma-protein metabolism, not elsewhere classified: Secondary | ICD-10-CM | POA: Diagnosis not present

## 2021-09-30 DIAGNOSIS — F32A Depression, unspecified: Secondary | ICD-10-CM | POA: Diagnosis present

## 2021-09-30 DIAGNOSIS — F419 Anxiety disorder, unspecified: Secondary | ICD-10-CM | POA: Diagnosis present

## 2021-09-30 DIAGNOSIS — Z79899 Other long term (current) drug therapy: Secondary | ICD-10-CM

## 2021-09-30 DIAGNOSIS — E871 Hypo-osmolality and hyponatremia: Secondary | ICD-10-CM | POA: Diagnosis present

## 2021-09-30 DIAGNOSIS — D62 Acute posthemorrhagic anemia: Secondary | ICD-10-CM | POA: Diagnosis present

## 2021-09-30 DIAGNOSIS — K219 Gastro-esophageal reflux disease without esophagitis: Secondary | ICD-10-CM | POA: Diagnosis present

## 2021-09-30 DIAGNOSIS — Z95820 Peripheral vascular angioplasty status with implants and grafts: Secondary | ICD-10-CM | POA: Diagnosis not present

## 2021-09-30 DIAGNOSIS — T402X5A Adverse effect of other opioids, initial encounter: Secondary | ICD-10-CM | POA: Diagnosis present

## 2021-09-30 DIAGNOSIS — Z8616 Personal history of COVID-19: Secondary | ICD-10-CM

## 2021-09-30 DIAGNOSIS — Z89511 Acquired absence of right leg below knee: Secondary | ICD-10-CM

## 2021-09-30 DIAGNOSIS — S90412A Abrasion, left great toe, initial encounter: Secondary | ICD-10-CM | POA: Diagnosis not present

## 2021-09-30 DIAGNOSIS — Z79891 Long term (current) use of opiate analgesic: Secondary | ICD-10-CM

## 2021-09-30 DIAGNOSIS — M545 Low back pain, unspecified: Secondary | ICD-10-CM | POA: Diagnosis present

## 2021-09-30 DIAGNOSIS — Y92239 Unspecified place in hospital as the place of occurrence of the external cause: Secondary | ICD-10-CM | POA: Diagnosis present

## 2021-09-30 DIAGNOSIS — Z794 Long term (current) use of insulin: Secondary | ICD-10-CM | POA: Diagnosis not present

## 2021-09-30 DIAGNOSIS — I10 Essential (primary) hypertension: Secondary | ICD-10-CM | POA: Diagnosis present

## 2021-09-30 DIAGNOSIS — X58XXXA Exposure to other specified factors, initial encounter: Secondary | ICD-10-CM | POA: Diagnosis not present

## 2021-09-30 DIAGNOSIS — Z7984 Long term (current) use of oral hypoglycemic drugs: Secondary | ICD-10-CM

## 2021-09-30 DIAGNOSIS — G8929 Other chronic pain: Secondary | ICD-10-CM | POA: Diagnosis present

## 2021-09-30 DIAGNOSIS — Z7902 Long term (current) use of antithrombotics/antiplatelets: Secondary | ICD-10-CM | POA: Diagnosis not present

## 2021-09-30 DIAGNOSIS — Z4781 Encounter for orthopedic aftercare following surgical amputation: Secondary | ICD-10-CM | POA: Diagnosis present

## 2021-09-30 DIAGNOSIS — Z89432 Acquired absence of left foot: Secondary | ICD-10-CM | POA: Diagnosis not present

## 2021-09-30 DIAGNOSIS — F17210 Nicotine dependence, cigarettes, uncomplicated: Secondary | ICD-10-CM | POA: Diagnosis present

## 2021-09-30 DIAGNOSIS — E785 Hyperlipidemia, unspecified: Secondary | ICD-10-CM | POA: Diagnosis present

## 2021-09-30 DIAGNOSIS — Z741 Need for assistance with personal care: Secondary | ICD-10-CM | POA: Diagnosis present

## 2021-09-30 DIAGNOSIS — Z888 Allergy status to other drugs, medicaments and biological substances status: Secondary | ICD-10-CM | POA: Diagnosis not present

## 2021-09-30 LAB — COMPREHENSIVE METABOLIC PANEL
ALT: 17 U/L (ref 0–44)
AST: 21 U/L (ref 15–41)
Albumin: 2.5 g/dL — ABNORMAL LOW (ref 3.5–5.0)
Alkaline Phosphatase: 71 U/L (ref 38–126)
Anion gap: 9 (ref 5–15)
BUN: 15 mg/dL (ref 8–23)
CO2: 26 mmol/L (ref 22–32)
Calcium: 7.9 mg/dL — ABNORMAL LOW (ref 8.9–10.3)
Chloride: 95 mmol/L — ABNORMAL LOW (ref 98–111)
Creatinine, Ser: 0.95 mg/dL (ref 0.61–1.24)
GFR, Estimated: >60 mL/min (ref >60)
Glucose, Bld: 249 mg/dL — ABNORMAL HIGH (ref 70–99)
Potassium: 4.6 mmol/L (ref 3.5–5.1)
Sodium: 130 mmol/L — ABNORMAL LOW (ref 135–145)
Total Bilirubin: <0.1 mg/dL — ABNORMAL LOW (ref 0.3–1.2)
Total Protein: 6 g/dL — ABNORMAL LOW (ref 6.5–8.1)

## 2021-09-30 LAB — GLUCOSE, CAPILLARY
Glucose-Capillary: 134 mg/dL — ABNORMAL HIGH (ref 70–99)
Glucose-Capillary: 319 mg/dL — ABNORMAL HIGH (ref 70–99)
Glucose-Capillary: 94 mg/dL (ref 70–99)
Glucose-Capillary: 201 mg/dL — ABNORMAL HIGH (ref 70–99)

## 2021-09-30 MED ORDER — INSULIN ASPART 100 UNIT/ML IJ SOLN
0.0000 [IU] | Freq: Three times a day (TID) | INTRAMUSCULAR | Status: DC
  Administered 2021-10-01: 3 [IU] via SUBCUTANEOUS
  Administered 2021-10-01: 2 [IU] via SUBCUTANEOUS
  Administered 2021-10-01: 3 [IU] via SUBCUTANEOUS
  Administered 2021-10-02: 2 [IU] via SUBCUTANEOUS
  Administered 2021-10-02 – 2021-10-03 (×2): 3 [IU] via SUBCUTANEOUS
  Administered 2021-10-03: 2 [IU] via SUBCUTANEOUS
  Administered 2021-10-04 – 2021-10-05 (×3): 3 [IU] via SUBCUTANEOUS
  Administered 2021-10-05 – 2021-10-06 (×2): 2 [IU] via SUBCUTANEOUS

## 2021-09-30 MED ORDER — ONDANSETRON HCL 4 MG/2ML IJ SOLN: 4.0000 mg | Freq: Four times a day (QID) | INTRAMUSCULAR | Status: DC | PRN

## 2021-09-30 MED ORDER — CITALOPRAM HYDROBROMIDE 10 MG PO TABS
20.0000 mg | ORAL_TABLET | Freq: Every day | ORAL | Status: DC
  Administered 2021-10-01 – 2021-10-06 (×6): 20 mg via ORAL
  Filled 2021-09-30 (×6): qty 2

## 2021-09-30 MED ORDER — METHOCARBAMOL 500 MG PO TABS
500.0000 mg | ORAL_TABLET | Freq: Four times a day (QID) | ORAL | Status: DC | PRN
  Administered 2021-10-01: 500 mg via ORAL
  Filled 2021-09-30: qty 1

## 2021-09-30 MED ORDER — INSULIN ASPART 100 UNIT/ML IJ SOLN: 4.0000 [IU] | Freq: Three times a day (TID) | INTRAMUSCULAR | 11 refills | Status: DC

## 2021-09-30 MED ORDER — SORBITOL 70 % SOLN: 30.0000 mL | Freq: Every day | Status: DC | PRN

## 2021-09-30 MED ORDER — PANTOPRAZOLE SODIUM 40 MG PO TBEC
40.0000 mg | DELAYED_RELEASE_TABLET | Freq: Every day | ORAL | Status: DC
  Administered 2021-10-01 – 2021-10-06 (×6): 40 mg via ORAL
  Filled 2021-09-30 (×6): qty 1

## 2021-09-30 MED ORDER — INSULIN GLARGINE-YFGN 100 UNIT/ML ~~LOC~~ SOLN
20.0000 [IU] | Freq: Every day | SUBCUTANEOUS | Status: DC
  Administered 2021-09-30 – 2021-10-02 (×3): 20 [IU] via SUBCUTANEOUS
  Filled 2021-09-30 (×4): qty 0.2

## 2021-09-30 MED ORDER — ONDANSETRON HCL 4 MG/2ML IJ SOLN: 4.0000 mg | Freq: Four times a day (QID) | INTRAMUSCULAR | 0 refills | Status: DC | PRN

## 2021-09-30 MED ORDER — ENSURE ENLIVE PO LIQD
237.0000 mL | Freq: Two times a day (BID) | ORAL | Status: DC
  Administered 2021-10-01 – 2021-10-06 (×10): 237 mL via ORAL

## 2021-09-30 MED ORDER — INSULIN ASPART 100 UNIT/ML IJ SOLN
0.0000 [IU] | Freq: Every day | INTRAMUSCULAR | Status: DC
  Administered 2021-09-30 – 2021-10-04 (×3): 2 [IU] via SUBCUTANEOUS

## 2021-09-30 MED ORDER — METHOCARBAMOL 500 MG PO TABS: 500.0000 mg | ORAL_TABLET | Freq: Four times a day (QID) | ORAL | 0 refills | Status: DC | PRN

## 2021-09-30 MED ORDER — GUAIFENESIN-DM 100-10 MG/5ML PO SYRP
15.0000 mL | ORAL_SOLUTION | ORAL | 0 refills | Status: DC | PRN
Start: 2021-09-30 — End: 2021-10-06

## 2021-09-30 MED ORDER — INSULIN ASPART 100 UNIT/ML IJ SOLN: 0.0000 [IU] | Freq: Three times a day (TID) | INTRAMUSCULAR | 11 refills | Status: DC

## 2021-09-30 MED ORDER — METFORMIN HCL 500 MG PO TABS
500.0000 mg | ORAL_TABLET | Freq: Two times a day (BID) | ORAL | Status: DC
  Administered 2021-10-01 – 2021-10-06 (×11): 500 mg via ORAL
  Filled 2021-09-30 (×11): qty 1

## 2021-09-30 MED ORDER — ONDANSETRON HCL 4 MG PO TABS
4.0000 mg | ORAL_TABLET | Freq: Four times a day (QID) | ORAL | Status: DC | PRN
  Filled 2021-09-30: qty 1

## 2021-09-30 MED ORDER — ACETAMINOPHEN 325 MG PO TABS
325.0000 mg | ORAL_TABLET | ORAL | Status: DC | PRN
  Administered 2021-10-01 – 2021-10-06 (×3): 650 mg via ORAL
  Filled 2021-09-30 (×3): qty 2

## 2021-09-30 MED ORDER — SENNOSIDES-DOCUSATE SODIUM 8.6-50 MG PO TABS: 1.0000 | ORAL_TABLET | Freq: Every evening | ORAL | Status: DC | PRN

## 2021-09-30 MED ORDER — PROSOURCE PLUS PO LIQD
30.0000 mL | Freq: Two times a day (BID) | ORAL | Status: DC
  Administered 2021-10-02: 30 mL via ORAL
  Filled 2021-09-30 (×4): qty 30

## 2021-09-30 MED ORDER — TRAMADOL HCL 50 MG PO TABS: 50.0000 mg | ORAL_TABLET | Freq: Four times a day (QID) | ORAL | Status: DC | PRN

## 2021-09-30 MED ORDER — ENOXAPARIN SODIUM 40 MG/0.4ML IJ SOSY
40.0000 mg | PREFILLED_SYRINGE | INTRAMUSCULAR | Status: DC
  Administered 2021-09-30 – 2021-10-05 (×6): 40 mg via SUBCUTANEOUS
  Filled 2021-09-30 (×6): qty 0.4

## 2021-09-30 MED ORDER — GABAPENTIN 600 MG PO TABS
600.0000 mg | ORAL_TABLET | Freq: Three times a day (TID) | ORAL | Status: DC
  Administered 2021-09-30 – 2021-10-06 (×17): 600 mg via ORAL
  Filled 2021-09-30 (×18): qty 1

## 2021-09-30 MED ORDER — INSULIN GLARGINE-YFGN 100 UNIT/ML ~~LOC~~ SOLN: 20.0000 [IU] | Freq: Every day | SUBCUTANEOUS | 11 refills | Status: DC

## 2021-09-30 MED ORDER — ZINC SULFATE 220 (50 ZN) MG PO CAPS
220.0000 mg | ORAL_CAPSULE | Freq: Every day | ORAL | Status: DC
  Administered 2021-10-01 – 2021-10-06 (×6): 220 mg via ORAL
  Filled 2021-09-30 (×6): qty 1

## 2021-09-30 MED ORDER — DOCUSATE SODIUM 100 MG PO CAPS: 100.0000 mg | ORAL_CAPSULE | Freq: Every day | ORAL | 0 refills | Status: DC

## 2021-09-30 MED ORDER — BISACODYL 5 MG PO TBEC: 5.0000 mg | DELAYED_RELEASE_TABLET | Freq: Every day | ORAL | 0 refills | Status: DC | PRN

## 2021-09-30 MED ORDER — ALUM & MAG HYDROXIDE-SIMETH 200-200-20 MG/5ML PO SUSP: 15.0000 mL | ORAL | 0 refills | Status: DC | PRN

## 2021-09-30 MED ORDER — OXYCODONE HCL 5 MG PO TABS
15.0000 mg | ORAL_TABLET | ORAL | Status: DC | PRN
  Administered 2021-09-30 – 2021-10-06 (×27): 15 mg via ORAL
  Filled 2021-09-30 (×27): qty 3

## 2021-09-30 MED ORDER — CLONAZEPAM 0.5 MG PO TABS
0.5000 mg | ORAL_TABLET | Freq: Two times a day (BID) | ORAL | Status: DC
  Administered 2021-09-30 – 2021-10-06 (×12): 0.5 mg via ORAL
  Filled 2021-09-30 (×12): qty 1

## 2021-09-30 NOTE — Progress Notes (Signed)
Patient ID: Cody Pena, male   DOB: Sep 22, 1960, 61 y.o.   MRN: [id number] Patient is postoperative day 3 right below-knee amputation.  The wound VAC is functioning well no drainage.  Patient is awaiting insurance authorization for inpatient rehab.  Patient's pain is better controlled.  Patient will need a wheelchair for discharge to home.

## 2021-09-30 NOTE — Progress Notes (Signed)
Pt arrived to Unit at 1750. Pt oriented to unit. Nurse explained rehab process, pt expressed understanding. Wife at bedside, all questions answered. Bed in lowest setting, call light within reach.

## 2021-09-30 NOTE — Progress Notes (Signed)
Called and received report from RN michael about this patients admission into Rehab at 1330. RN will call before transporting pt to 4M11.

## 2021-09-30 NOTE — Progress Notes (Signed)
Inpatient Rehabilitation Admission Medication Review by a Pharmacist  A complete drug regimen review was completed for this patient to identify any potential clinically significant medication issues.  High Risk Drug Classes Is patient taking? Indication by Medication  Antipsychotic No   Anticoagulant Yes Enoxaparin for VTE prophylaxis  Antibiotic No   Opioid No Oxycodone 15 mg q4h prn severe pain  Antiplatelet Yes Plavix  Hypoglycemics/insulin Yes SSI, Semglee, Metformin for glucose control  Vasoactive Medication No   Chemotherapy No   Other Yes Citalopram and Clonazepam - mood stabilization Gabapentin - neuropathic pain Zinc - supplement for 14 days Pantoprazole - GERD     Type of Medication Issue Identified Description of Issue Recommendation(s)  Additional Drug Therapy Needed  Aspirin 81 mg, Atorvastatin 80 mg, Nicotine patch 21 mg, MVI  Insulin Aspart 4 units with meals   Docusate daily not continued on transfer Contact MD regarding resuming meds on 2/19.  To monitor CBGs for any need to resume.  Has prn Senokot-S and Sorbitol. Adjust bowel regimen as indicated  Significant med changes from prior encounter (inform family/care partners about these prior to discharge). Was taking Clonazepam 0.5 mg TID on inpatient unit > changed to BID on transfer.  Previously on Percocet prn, now Oxycodone prn (no acetaminophen).  Monitor for any need to adjust dosing frequency.      Other  Prns not continued on transfer: albuterol nebs, Guafenesin DM.  Also off weekly Vitamin D Resume when appropriate    Clinically significant medication issues were identified that warrant physician communication and completion of prescribed/recommended actions by midnight of the next day:  Yes  Name of provider notified for non-urgent issues identified:  Dr. Wynn Banker  Provider Method of Notification:    Secure chat  Pharmacist comments:    Aspirin 81 mg, Atorvastatin 80 mg, MVI and Nicotine  patch all given 2/18 am on inpatient unit.  Time spent performing this drug regimen review (minutes):  [address]   Dennie Fetters, [place] 09/30/2021 7:16 PM

## 2021-09-30 NOTE — Progress Notes (Signed)
Inpatient Rehab Admissions Coordinator:  ? ?I have a bed for this Pt. On CIR today. RN may call report to [phone]. ? ?Betty Brooks, MS, CCC-SLP ?Rehab Admissions Coordinator  ?[phone] (celll) ?[phone] (office) ?

## 2021-09-30 NOTE — H&P (Signed)
Physical Medicine and Rehabilitation Admission H&P    No chief complaint on file. : HPI: 61 year old male with history of severe peripheral artery disease, diabetes, hypertension, chronic low back pain and depression who was admitted for dehiscence of TMA of the right foot on 09/27/2021.  The patient had a right transmetatarsal amputation on 08/25/2021 for a nonhealing diabetic ulcer and had postoperative wound infection.  The patient's blood sugar was up at 252 on admission he was managed with Lantus Humalog and metformin.  He was switched to [facility]  His hypertension was well controlled.  Recent arteriogram on 08/11/2021 showed severe right SFA and popliteal occlusion and underwent stenting at that time.  He has recently given up smoking.  The patient is on chronic narcotic analgesic for back pain and takes approximately 5-6 Percocet 10 mg tablets per day.  He has chronic anxiety and has been taking Klonopin 0.5 twice daily on average Review of Systems  Constitutional:  Negative for chills, diaphoresis and fever.  HENT:  Negative for congestion and nosebleeds.   Eyes:  Negative for pain, discharge and redness.  Respiratory:  Negative for cough, hemoptysis, shortness of breath and wheezing.   Cardiovascular:  Positive for leg swelling. Negative for chest pain.  Gastrointestinal:  Negative for heartburn, nausea and vomiting.  Genitourinary:  Negative for flank pain and hematuria.  Musculoskeletal:  Positive for back pain.  Skin:  Negative for rash.  Neurological:  Positive for weakness.  Endo/Heme/Allergies:  Does not bruise/bleed easily.  Psychiatric/Behavioral:  Positive for depression.     Past Medical History:  Diagnosis Date   COVID    has had Covid 3 times, not sick with it   Depression    Diabetes mellitus without complication ([place])    pt states he has now been told that he is a type 1 diabetic.   Hypertension    Peripheral vascular disease [facility])    Past Surgical History:   Procedure Laterality Date   ABDOMINAL AORTOGRAM W/LOWER EXTREMITY N/A 08/11/2021   Procedure: ABDOMINAL AORTOGRAM W/LOWER EXTREMITY;  Surgeon: Cherre Robins, MD;  Location: [place] [facility];  Service: Cardiovascular;  Laterality: N/A;   AMPUTATION Left 11/15/2017   Procedure: LEFT FOOT 2ND RAY AMPUTATION;  Surgeon: Newt Minion, MD;  Location: [place];  Service: Orthopedics;  Laterality: Left;   AMPUTATION Right 08/25/2021   Procedure: RIGHT TRANSMETATARSAL AMPUTATION;  Surgeon: Newt Minion, MD;  Location: [place];  Service: Orthopedics;  Laterality: Right;   AMPUTATION Right 09/27/2021   Procedure: BELOW KNEE AMPUTATION;  Surgeon: Newt Minion, MD;  Location: [place];  Service: Orthopedics;  Laterality: Right;   CERVICAL FUSION     IR ANGIOGRAM EXTREMITY LEFT  11/14/2017   IR ANGIOGRAM SELECTIVE EACH ADDITIONAL VESSEL  11/14/2017   IR ANGIOGRAM SELECTIVE EACH ADDITIONAL VESSEL  11/14/2017   IR TIB-PERO ART PTA MOD SED  11/14/2017   IR TIB-PERO ART UNI PTA EA ADD VESSEL MOD SED  11/14/2017   IR US GUIDE VASC ACCESS LEFT  11/14/2017   PERIPHERAL VASCULAR INTERVENTION Right 08/11/2021   Procedure: PERIPHERAL VASCULAR INTERVENTION;  Surgeon: Cherre Robins, MD;  Location: [place] [facility];  Service: Cardiovascular;  Laterality: Right;  rt sfa stent   History reviewed. No pertinent family history. Social History:  reports that he has been smoking cigarettes. He has a 23.50 pack-year smoking history. He has never used smokeless tobacco. He reports that he does not drink alcohol and does not use  drugs. Allergies:  Allergies  Allergen Reactions   Ceftriaxone Itching, Nausea And Vomiting and Rash    Noted minutes after starting medication   Medications Prior to Admission  Medication Sig Dispense Refill   albuterol (VENTOLIN HFA) 108 (90 Base) MCG/ACT inhaler Inhale 2 puffs into the lungs every 6 (six) hours as needed for shortness of breath.     [EXPIRED] amoxicillin-clavulanate (AUGMENTIN) 875-125  MG tablet Take 1 tablet by mouth 2 (two) times daily for 7 days. 14 tablet 0   aspirin 81 MG EC tablet Take 1 tablet (81 mg total) by mouth daily. Swallow whole. 150 tablet 2   atorvastatin (LIPITOR) 80 MG tablet Take 1 tablet (80 mg total) by mouth daily. (Patient taking differently: Take 80 mg by mouth at bedtime.) 30 tablet 3   citalopram (CELEXA) 20 MG tablet Take 1 tablet (20 mg total) by mouth daily. 30 tablet 0   clonazePAM (KLONOPIN) 0.5 MG tablet Take 0.5 mg by mouth 3 (three) times daily.     clopidogrel (PLAVIX) 75 MG tablet Take 1 tablet (75 mg total) by mouth daily. 30 tablet 3   gabapentin (NEURONTIN) 600 MG tablet Take 1 tablet (600 mg total) by mouth 3 (three) times daily. 90 tablet 3   ibuprofen (ADVIL) 200 MG tablet Take 800 mg by mouth every 6 (six) hours as needed for moderate pain or headache.     Insulin Glargine (BASAGLAR KWIKPEN) 100 UNIT/ML Inject 24 Units into the skin at bedtime. 9 mL 2   insulin lispro (HUMALOG) 100 UNIT/ML KwikPen use 3 times daily with meals BG<70-120 0unit; BG121-150 2unit; BG151-200 3unit; BG 201-250 5unit; BG251-300 8unit; BG301-350 11unit; BG351-400 15 units BG>400call MD 15 mL 11   metFORMIN (GLUCOPHAGE) 500 MG tablet Take 1 tablet (500 mg total) by mouth 2 (two) times daily with a meal. 60 tablet 11   Multiple Vitamin (MULTIVITAMIN WITH MINERALS) TABS tablet Take 1 tablet by mouth daily. 30 tablet 0   oxyCODONE-acetaminophen (PERCOCET) 10-325 MG tablet Take 1 tablet by mouth every 4 (four) hours as needed for pain. 30 tablet 0   Vitamin D, Ergocalciferol, (DRISDOL) 1.25 MG (50000 UNIT) CAPS capsule Take 1 capsule (50,000 Units total) by mouth every 7 (seven) days. (Patient taking differently: Take 50,000 Units by mouth every Thursday.) 5 capsule 0   Accu-Chek Softclix Lancets lancets Use up to four times daily as directed. 100 each 0   Blood Glucose Monitoring Suppl (BLOOD GLUCOSE MONITOR SYSTEM) w/Device KIT Use up to four times daily as directed.  1 kit 0   glucose blood test strip Use as directed up to four times daily. 100 each 0   Insulin Pen Needle 32G X 4 MM MISC Use as directed with insulin pens. 200 each 0   nicotine (NICODERM CQ - DOSED IN MG/24 HOURS) 21 mg/24hr patch Place 1 patch (21 mg total) onto the skin daily. 28 patch 0   polyethylene glycol (MIRALAX / GLYCOLAX) 17 g packet Take 17 g by mouth daily as needed for mild constipation. 14 each 0      Home: Home Living Family/patient expects to be discharged to:: Inpatient rehab Living Arrangements: Spouse/significant other Available Help at Discharge: Family, Available 24 hours/day Type of Home: House Home Access: Stairs to enter [employer] of Steps: 4 (Pt reports a ramp is being constructed) Entrance Stairs-Rails: None Home Layout: One level Bathroom Shower/Tub: Tub/shower unit, Multimedia programmer: Standard Home Equipment: Conservation officer, nature (2 wheels), [employer] - single point, BSC/3in1  Additional Comments: heel walking on RLE before his surgery   Functional History: Prior Function Prior Level of Function : Independent/Modified Independent Mobility Comments: used SPC or RW for maintenance depending on wb recommendations for RLE ADLs Comments: Pt and wife report he was mod I for ADLs  Functional Status:  Mobility: Bed Mobility Overal bed mobility: Modified Independent Transfers Overall transfer level: Needs assistance Equipment used: Rolling walker (2 wheels), Crutches Transfers: Sit to/from Stand, Bed to chair/wheelchair/BSC Sit to Stand: Min assist Bed to/from chair/wheelchair/BSC transfer type:: Step pivot Step pivot transfers: Min assist General transfer comment: pt requires minA to steady during transfers, improved balance with use of RW compared to attempt with crutches Ambulation/Gait Ambulation/Gait assistance: Min guard Gait Distance (Feet): 25 Feet (25' x 2) Assistive device: Rolling walker (2 wheels) Gait Pattern/deviations:   (hop-to gait) General Gait Details: pt with slowed hop-to gait, PT with verbal cues for seuquencing turns Gait velocity: reduced Gait velocity interpretation: <1.8 ft/sec, indicate of risk for recurrent falls    ADL: ADL Overall ADL's : Needs assistance/impaired Eating/Feeding: Independent Grooming: Wash/dry hands, Wash/dry face, Oral care, Brushing hair, Min guard, Standing Upper Body Bathing: Set up, Sitting Lower Body Bathing: Moderate assistance, Sit to/from stand Upper Body Dressing : Set up, Supervision/safety, Sitting Lower Body Dressing: Min guard, Sit to/from stand Toilet Transfer: Min guard, Ambulation, Comfort height toilet, Grab bars, Rolling walker (2 wheels) Toileting- Clothing Manipulation and Hygiene: Min guard, Sit to/from stand Functional mobility during ADLs: Min guard, Rolling walker (2 wheels) General ADL Comments: activity limited by pain  Cognition: Cognition Overall Cognitive Status: Within Functional Limits for tasks assessed Orientation Level: Oriented X4 Cognition Arousal/Alertness: Awake/alert Behavior During Therapy: WFL for tasks assessed/performed Overall Cognitive Status: Within Functional Limits for tasks assessed General Comments: Pt follows commands consistently with cues, but demonstrates focused to sustained attention due to lethargy.  Wife reports pt not at baseline - likely due to meds  Physical Exam: Blood pressure (!) 105/58, pulse 72, temperature 98.2 F (36.8 C), temperature source Oral, resp. rate 16, height 6' 4"  (1.93 m), weight 100.6 kg, SpO2 97 %. Physical Exam  General: No acute distress mildly lethargic Mood and affect are appropriate Heart: Regular rate and rhythm no rubs murmurs or extra sounds Lungs: Clear to auscultation, breathing unlabored, no rales or wheezes Abdomen: Positive bowel sounds, soft nontender to palpation, nondistended Extremities: No clubbing, cyanosis, or edema Skin: No evidence of breakdown, no evidence  of rash Neurologic: Cranial nerves II through XII intact, motor strength is 5/5 in bilateral deltoid, bicep, tricep, grip, left hip flexor, knee extensors, ankle dorsiflexor and plantar flexor, right lower extremity is status post BKA with wound VAC not tested Sensory exam normal sensation to light touch  in bilateral upper and lower extremities Cerebellar exam normal finger to nose to finger as well as heel to shin in bilateral upper and left lower extremities Musculoskeletal: Full range of motion in all 4 extremities. No joint swelling  Results for orders placed or performed during the hospital encounter of 09/27/21 (from the past 48 hour(s))  Glucose, capillary     Status: Abnormal   Collection Time: 09/28/21  9:28 PM  Result Value Ref Range   Glucose-Capillary 151 (H) 70 - 99 mg/dL    Comment: Glucose reference range applies only to samples taken after fasting for at least 8 hours.  CBC     Status: Abnormal   Collection Time: 09/29/21  1:55 AM  Result Value Ref Range   WBC  12.7 (H) 4.0 - 10.5 K/uL   RBC 3.18 (L) 4.22 - 5.81 MIL/uL   Hemoglobin 10.0 (L) 13.0 - 17.0 g/dL   HCT 30.0 (L) 39.0 - 52.0 %   MCV 94.3 80.0 - 100.0 fL   MCH 31.4 26.0 - 34.0 pg   MCHC 33.3 30.0 - 36.0 g/dL   RDW 13.1 11.5 - 15.5 %   Platelets 214 150 - 400 K/uL   nRBC 0.0 0.0 - 0.2 %    Comment: Performed at [facility], [place] [address] [place], [place] [address]  Glucose, capillary     Status: Abnormal   Collection Time: 09/29/21  7:22 AM  Result Value Ref Range   Glucose-Capillary 145 (H) 70 - 99 mg/dL    Comment: Glucose reference range applies only to samples taken after fasting for at least 8 hours.  Glucose, capillary     Status: Abnormal   Collection Time: 09/29/21 11:46 AM  Result Value Ref Range   Glucose-Capillary 173 (H) 70 - 99 mg/dL    Comment: Glucose reference range applies only to samples taken after fasting for at least 8 hours.  Glucose, capillary     Status: Abnormal    Collection Time: 09/29/21  4:05 PM  Result Value Ref Range   Glucose-Capillary 198 (H) 70 - 99 mg/dL    Comment: Glucose reference range applies only to samples taken after fasting for at least 8 hours.  Glucose, capillary     Status: Abnormal   Collection Time: 09/29/21  9:08 PM  Result Value Ref Range   Glucose-Capillary 166 (H) 70 - 99 mg/dL    Comment: Glucose reference range applies only to samples taken after fasting for at least 8 hours.  Glucose, capillary     Status: Abnormal   Collection Time: 09/30/21  8:19 AM  Result Value Ref Range   Glucose-Capillary 134 (H) 70 - 99 mg/dL    Comment: Glucose reference range applies only to samples taken after fasting for at least 8 hours.  Glucose, capillary     Status: Abnormal   Collection Time: 09/30/21 12:16 PM  Result Value Ref Range   Glucose-Capillary 319 (H) 70 - 99 mg/dL    Comment: Glucose reference range applies only to samples taken after fasting for at least 8 hours.  Glucose, capillary     Status: None   Collection Time: 09/30/21  4:32 PM  Result Value Ref Range   Glucose-Capillary 94 70 - 99 mg/dL    Comment: Glucose reference range applies only to samples taken after fasting for at least 8 hours.   No results found.    Blood pressure (!) 105/58, pulse 72, temperature 98.2 F (36.8 C), temperature source Oral, resp. rate 16, height 6' 4"  (1.93 m), weight 100.6 kg, SpO2 97 %.  Medical Problem List and Plan: 1. Functional deficits secondary to right BKA  -patient may not shower until wound VAC is discontinued  -ELOS/Goals: 5-7, 2.  Antithrombotics: -DVT/anticoagulation:  Pharmaceutical: Lovenox  -antiplatelet therapy: Aspirin 81 mg daily Plavix 75 mg daily for right SFA and right popliteal stents placed in December 2022 3. Pain Management: Oxycodone 10-15 mg every 4 hours.  Robaxin 500 mg p.o. every 6 hours as needed gabapentin 600 mg 3 times daily 4. Mood: Celexa 20 mg/day, Klonopin 0.5 mg takes twice daily at  home  -antipsychotic agents: Not applicable 5. Neuropsych: This patient is capable of making decisions on his own behalf. 6. Skin/Wound Care: Has wound VAC  for right lower extremity, monitor for signs of skin breakdown left foot and buttocks region, use of sulfate 220 mg daily for 14 days 7. Fluids/Electrolytes/Nutrition: Carb modified diet monitor I's and O's, Ensure max 11 ounces twice daily 8.  Constipation related to opioids use MiraLAX as needed 9.  GERD Protonix 40 mg/day  10.  Diabetes metformin 500 mg twice daily, NovoLog 4 units subcu 3 times daily, sliding scale moderate, Semglee 20 units subcu nightly  "I have personally performed a face to face diagnostic evaluation of this patient.  Additionally, I have reviewed and concur with the physician assistant's documentation above."   Charlett Blake, MD 09/30/2021

## 2021-09-30 NOTE — Evaluation (Signed)
Occupational Therapy Assessment and Plan  Patient Details  Name: Cody Pena MRN: [id number] Date of Birth: [birth year]  OT Diagnosis: acute pain, lumbago (low back pain), and muscle weakness (generalized) Rehab Potential: Rehab Potential (ACUTE ONLY): Good ELOS: 7-10 days   Today's Date: 10/01/2021 OT Individual Time: 1025-1130 OT Individual Time Calculation (min): 65 min     Hospital Problem: Principal Problem:   Below-knee amputation of right lower extremity (HCC) Active Problems:   S/P BKA (below knee amputation), right ([place])   Past Medical History:  Past Medical History:  Diagnosis Date   COVID    has had Covid 3 times, not sick with it   Depression    Diabetes mellitus without complication ([place])    pt states he has now been told that he is a type 1 diabetic.   Hypertension    Peripheral vascular disease ([place])    Past Surgical History:  Past Surgical History:  Procedure Laterality Date   ABDOMINAL AORTOGRAM W/LOWER EXTREMITY N/A 08/11/2021   Procedure: ABDOMINAL AORTOGRAM W/LOWER EXTREMITY;  Surgeon: Cherre Robins, MD;  Location: [place] [facility];  Service: Cardiovascular;  Laterality: N/A;   AMPUTATION Left 11/15/2017   Procedure: LEFT FOOT 2ND RAY AMPUTATION;  Surgeon: Newt Minion, MD;  Location: [place];  Service: Orthopedics;  Laterality: Left;   AMPUTATION Right 08/25/2021   Procedure: RIGHT TRANSMETATARSAL AMPUTATION;  Surgeon: Newt Minion, MD;  Location: [place];  Service: Orthopedics;  Laterality: Right;   AMPUTATION Right 09/27/2021   Procedure: BELOW KNEE AMPUTATION;  Surgeon: Newt Minion, MD;  Location: [place];  Service: Orthopedics;  Laterality: Right;   CERVICAL FUSION     IR ANGIOGRAM EXTREMITY LEFT  11/14/2017   IR ANGIOGRAM SELECTIVE EACH ADDITIONAL VESSEL  11/14/2017   IR ANGIOGRAM SELECTIVE EACH ADDITIONAL VESSEL  11/14/2017   IR TIB-PERO ART PTA MOD SED  11/14/2017   IR TIB-PERO ART UNI PTA EA ADD VESSEL MOD SED  11/14/2017   IR US GUIDE VASC  ACCESS LEFT  11/14/2017   PERIPHERAL VASCULAR INTERVENTION Right 08/11/2021   Procedure: PERIPHERAL VASCULAR INTERVENTION;  Surgeon: Cherre Robins, MD;  Location: [place] [facility];  Service: Cardiovascular;  Laterality: Right;  rt sfa stent    Assessment & Plan Clinical Impression: 61 year old male with history of severe peripheral artery disease, diabetes, hypertension, chronic low back pain and depression who was admitted for dehiscence of TMA of the right foot on 09/27/2021.  The patient had a right transmetatarsal amputation on 08/25/2021 for a nonhealing diabetic ulcer and had postoperative wound infection.  The patient's blood sugar was up at 252 on admission he was managed with Lantus Humalog and metformin.  He was switched to [facility].  His hypertension was well controlled.  Recent arteriogram on 08/11/2021 showed severe right SFA and popliteal occlusion and underwent stenting at that time.  He has recently given up smoking.  The patient is on chronic narcotic analgesic for back pain and takes approximately 5-6 Percocet 10 mg tablets per day.  He has chronic anxiety and has been taking Klonopin 0.5 twice daily on average  Patient currently requires min with basic self-care skills secondary to muscle weakness and limb loss, decreased cardiorespiratoy endurance, and decreased standing balance, decreased balance strategies, and pain .  Prior to hospitalization, patient could complete BADLs with independent .  Patient will benefit from skilled intervention to increase independence with basic self-care skills prior to discharge  home with wife .  Anticipate patient  will require 24 hour supervision and follow up home health.  OT - End of Session Endurance Deficit: No OT Assessment Rehab Potential (ACUTE ONLY): Good OT Barriers to Discharge: Wound Care OT Patient demonstrates impairments in the following area(s): Balance;Sensory;Skin Integrity;Motor;Pain;Safety OT Basic ADL's Functional  Problem(s): Grooming;Bathing;Dressing;Toileting OT Advanced ADL's Functional Problem(s): Simple Meal Preparation OT Transfers Functional Problem(s): Toilet;Tub/Shower OT Additional Impairment(s): None OT Plan OT Intensity: Minimum of 1-2 x/day, 45 to 90 minutes OT Frequency: 5 out of 7 days OT Duration/Estimated Length of Stay: 7-10 days OT Treatment/Interventions: Balance/vestibular training;Disease mangement/prevention;Self Care/advanced ADL retraining;Therapeutic Exercise;Wheelchair propulsion/positioning;UE/LE Strength taining/ROM;Skin care/wound managment;Pain management;DME/adaptive equipment instruction;Community reintegration;Patient/family education;UE/LE Coordination activities;Therapeutic Activities;Psychosocial support;Functional mobility training;Discharge planning OT Self Feeding Anticipated Outcome(s): no goal OT Basic Self-Care Anticipated Outcome(s): supervision OT Toileting Anticipated Outcome(s): supervision OT Bathroom Transfers Anticipated Outcome(s): supervision OT Recommendation Recommendations for Other Services:  (none recommended) Patient destination: Home Follow Up Recommendations: 24 hour supervision/assistance Equipment Recommended: To be determined   OT Evaluation Precautions/Restrictions  Precautions Precautions: Fall Precaution Comments: wound vac on RLE Restrictions Weight Bearing Restrictions: Yes LLE Weight Bearing: Non weight bearing General   Vital Signs   Pain Pain Assessment Pain Score: 5  Home Living/Prior Functioning Home Living Available Help at Discharge: Family, Available 24 hours/day Type of Home: House Home Access: Ramped entrance (per pt, family installing ramp before he goes home) Home Layout: One level Bathroom Shower/Tub: Chiropodist: Standard Bathroom Accessibility: Yes  Lives With: Spouse Veterinary surgeon) IADL History Homemaking Responsibilities: Yes (pt reported being fully independent with IADL PTA,  splitting up responsibilities with spouse) Occupation: Full time employment 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, Comptroller Leisure and Hobbies: hunting, fishing, painting Prior Function Level of Independence: Independent with homemaking with ambulation, Independent with basic ADLs Driving: Yes Vision Baseline Vision/History: 1 Wears glasses (for reading) Ability to See in Adequate Light: 0 Adequate Patient Visual Report: No change from baseline Vision Assessment?: No apparent visual deficits Perception  Perception: Within Functional Limits Praxis Praxis: Intact Cognition Overall Cognitive Status: Within Functional Limits for tasks assessed Arousal/Alertness: Awake/alert Orientation Level: Place;Situation;Person Person: Oriented Place: Oriented Situation: Oriented Year: 2023 Month: February Day of Week: Correct Immediate Memory Recall: Sock;Blue;Bed Memory Recall Sock: Without Cue Memory Recall Blue: Without Cue Memory Recall Bed: Not able to recall Awareness: Impaired Awareness Impairment: Emergent impairment;Anticipatory impairment Problem Solving: Appears intact Behaviors: Impulsive Safety/Judgment: Impaired Comments: Pt impulsive with functional movement, decreased insight into present deficits which impacts safety awareness Sensation Sensation Light Touch: Impaired Detail Light Touch Impaired Details: Impaired RLE Coordination Gross Motor Movements are Fluid and Coordinated: No Fine Motor Movements are Fluid and Coordinated: Yes Coordination and Movement Description: Coordination affected by limb loss and resultant balance deficits in stance during functional activity Finger Nose Finger Test: WNL bilaterally Motor  Motor Motor: Other (comment) Motor - Skilled Clinical Observations: Affected by limb loss and balance deficits  Trunk/Postural Assessment  Cervical Assessment Cervical Assessment: Within Functional Limits Thoracic  Assessment Thoracic Assessment: Within Functional Limits Lumbar Assessment Lumbar Assessment: Exceptions to [facility] Postural Control Postural Control: Deficits on evaluation (insufficient in stance during dynamic self care activity)  Balance Balance Balance Assessed: Yes Dynamic Sitting Balance Dynamic Sitting - Balance Support: During functional activity Dynamic Sitting - Level of Assistance: 5: Stand by assistance (donning overhead shirt) Dynamic Standing Balance Dynamic Standing - Balance Support: During functional activity Dynamic Standing - Level of Assistance: 4: Min assist Dynamic Standing - Balance Activities: Forward lean/weight shifting;Lateral lean/weight shifting;Reaching across midline (pericare  completion while standing) Extremity/Trunk Assessment RUE Assessment RUE Assessment: Within Functional Limits LUE Assessment LUE Assessment: Within Functional Limits  Care Tool Care Tool Self Care Eating    Not assessed    Oral Care  Oral care, brush teeth, clean dentures activity did not occur:  (not attempted due to time constraints)      Bathing   Body parts bathed by patient: Right arm;Left arm;Chest;Abdomen;Front perineal area;Buttocks;Right upper leg;Left upper leg;Face;Left lower leg   Body parts n/a: Right lower leg Assist Level: Minimal Assistance - Patient > 75%    Upper Body Dressing(including orthotics)   What is the patient wearing?: Pull over shirt   Assist Level: Set up assist    Lower Body Dressing (excluding footwear)   What is the patient wearing?: Pants;Underwear/pull up Assist for lower body dressing: Minimal Assistance - Patient > 75%    Putting on/Taking off footwear   What is the patient wearing?: Non-skid slipper socks;Ted hose Assist for footwear: Supervision/Verbal cueing       Care Tool Toileting Toileting activity   Assist for toileting: Minimal Assistance - Patient > 75%      Care Tool Transfers        Toilet transfer   Assist  Level: Minimal Assistance - Patient > 75%      Refer to Care Plan for Long Term Goals  SHORT TERM GOAL WEEK 1 OT Short Term Goal 1 (Week 1): STGs=LTGs due to ELOS  Recommendations for other services: None    Skilled Therapeutic Intervention Skilled OT session completed with focus on initial evaluation, education on OT role/POC, and establishment of patient-centered goals.   Pt greeted in bed and premedicated for pain. Note that he had transferred himself to the recliner (that was not locked). OT asked him to transfer back to bed, which he did via squat pivot with CGA after OT secured locks of recliner. Reminded pt that he cannot transfer without staff assistance, pt reporting that he is already aware of this. Bathing/dressing tasks completed EOB at sit<stand level using RW for standing support. Setup for UB self care. Min balance assist in standing due to pt having posterior LOBs with unilateral support on the walker only. Pt lost his balance backwards onto the bed x2. Pt able to hop to the toilet using RW with CGA-Min A. He urinated in standing using the grab bars. Note that he had 1 sudden lateral LOB towards the Rt requiring Mod A to correct, pt "caught" by the wall. Pt reported that he just felt like he was "falling asleep" in standing. No dizziness or lightheadedness reported. He then returned to bed and remained sitting up. Wife present. All needs within reach and bed alarm set at session exit.     ADL ADL Grooming: Setup Where Assessed-Grooming: Edge of bed Upper Body Bathing: Setup Where Assessed-Upper Body Bathing: Edge of bed Lower Body Bathing: Minimal assistance Where Assessed-Lower Body Bathing: Edge of bed Upper Body Dressing: Setup Where Assessed-Upper Body Dressing: Edge of bed Lower Body Dressing: Minimal assistance Toileting: Minimal assistance Where Assessed-Toileting: Glass blower/designer: Psychiatric nurse Method: Ambulating (RW) Curator: Energy manager: Not assessed     Discharge Criteria: Patient will be discharged from OT if patient refuses treatment 3 consecutive times without medical reason, if treatment goals not met, if there is a change in medical status, if patient makes no progress towards goals or if patient is discharged from hospital.  The above assessment, treatment plan,  treatment alternatives and goals were discussed and mutually agreed upon: by patient  Skeet Simmer 10/01/2021, 12:35 PM

## 2021-09-30 NOTE — H&P (Signed)
Physical Medicine and Rehabilitation Admission H&P    No chief complaint on file. : HPI: 61 year old male with history of severe peripheral artery disease, diabetes, hypertension, chronic low back pain and depression who was admitted for dehiscence of TMA of the right foot on 09/27/2021.  The patient had a right transmetatarsal amputation on 08/25/2021 for a nonhealing diabetic ulcer and had postoperative wound infection.  The patient's blood sugar was up at 252 on admission he was managed with Lantus Humalog and metformin.  He was switched to [facility].  His hypertension was well controlled.  Recent arteriogram on 08/11/2021 showed severe right SFA and popliteal occlusion and underwent stenting at that time.  He has recently given up smoking.  The patient is on chronic narcotic analgesic for back pain and takes approximately 5-6 Percocet 10 mg tablets per day.  He has chronic anxiety and has been taking Klonopin 0.5 twice daily on average Review of Systems  Constitutional:  Negative for chills, diaphoresis and fever.  HENT:  Negative for congestion and nosebleeds.   Eyes:  Negative for pain, discharge and redness.  Respiratory:  Negative for cough, hemoptysis, shortness of breath and wheezing.   Cardiovascular:  Positive for leg swelling. Negative for chest pain.  Gastrointestinal:  Negative for heartburn, nausea and vomiting.  Genitourinary:  Negative for flank pain and hematuria.  Musculoskeletal:  Positive for back pain.  Skin:  Negative for rash.  Neurological:  Positive for weakness.  Endo/Heme/Allergies:  Does not bruise/bleed easily.  Psychiatric/Behavioral:  Positive for depression.     Past Medical History:  Diagnosis Date   COVID    has had Covid 3 times, not sick with it   Depression    Diabetes mellitus without complication ([place])    pt states he has now been told that he is a type 1 diabetic.   Hypertension    Peripheral vascular disease [facility])    Past Surgical History:   Procedure Laterality Date   ABDOMINAL AORTOGRAM W/LOWER EXTREMITY N/A 08/11/2021   Procedure: ABDOMINAL AORTOGRAM W/LOWER EXTREMITY;  Surgeon: Cherre Robins, MD;  Location: [place] [facility];  Service: Cardiovascular;  Laterality: N/A;   AMPUTATION Left 11/15/2017   Procedure: LEFT FOOT 2ND RAY AMPUTATION;  Surgeon: Newt Minion, MD;  Location: [place];  Service: Orthopedics;  Laterality: Left;   AMPUTATION Right 08/25/2021   Procedure: RIGHT TRANSMETATARSAL AMPUTATION;  Surgeon: Newt Minion, MD;  Location: [place];  Service: Orthopedics;  Laterality: Right;   AMPUTATION Right 09/27/2021   Procedure: BELOW KNEE AMPUTATION;  Surgeon: Newt Minion, MD;  Location: [place];  Service: Orthopedics;  Laterality: Right;   CERVICAL FUSION     IR ANGIOGRAM EXTREMITY LEFT  11/14/2017   IR ANGIOGRAM SELECTIVE EACH ADDITIONAL VESSEL  11/14/2017   IR ANGIOGRAM SELECTIVE EACH ADDITIONAL VESSEL  11/14/2017   IR TIB-PERO ART PTA MOD SED  11/14/2017   IR TIB-PERO ART UNI PTA EA ADD VESSEL MOD SED  11/14/2017   IR US GUIDE VASC ACCESS LEFT  11/14/2017   PERIPHERAL VASCULAR INTERVENTION Right 08/11/2021   Procedure: PERIPHERAL VASCULAR INTERVENTION;  Surgeon: Cherre Robins, MD;  Location: [place] [facility];  Service: Cardiovascular;  Laterality: Right;  rt sfa stent   No family history on file. Social History:  reports that he has been smoking cigarettes. He has a 23.50 pack-year smoking history. He has never used smokeless tobacco. He reports that he does not drink alcohol and does not use drugs.  Allergies:  Allergies  Allergen Reactions   Ceftriaxone Itching, Nausea And Vomiting and Rash    Noted minutes after starting medication   Medications Prior to Admission  Medication Sig Dispense Refill   Accu-Chek Softclix Lancets lancets Use up to four times daily as directed. 100 each 0   albuterol (VENTOLIN HFA) 108 (90 Base) MCG/ACT inhaler Inhale 2 puffs into the lungs every 6 (six) hours as needed for shortness  of breath.     alum & mag hydroxide-simeth (MAALOX/MYLANTA) 200-200-20 MG/5ML suspension Take 15-30 mLs by mouth every 2 (two) hours as needed for indigestion. 355 mL 0   aspirin 81 MG EC tablet Take 1 tablet (81 mg total) by mouth daily. Swallow whole. 150 tablet 2   atorvastatin (LIPITOR) 80 MG tablet Take 1 tablet (80 mg total) by mouth daily. (Patient taking differently: Take 80 mg by mouth at bedtime.) 30 tablet 3   bisacodyl (DULCOLAX) 5 MG EC tablet Take 1 tablet (5 mg total) by mouth daily as needed for moderate constipation. 30 tablet 0   Blood Glucose Monitoring Suppl (BLOOD GLUCOSE MONITOR SYSTEM) w/Device KIT Use up to four times daily as directed. 1 kit 0   clonazePAM (KLONOPIN) 0.5 MG tablet Take 0.5 mg by mouth 3 (three) times daily.     clopidogrel (PLAVIX) 75 MG tablet Take 1 tablet (75 mg total) by mouth daily. 30 tablet 3   [START ON 10/01/2021] docusate sodium (COLACE) 100 MG capsule Take 1 capsule (100 mg total) by mouth daily. 10 capsule 0   gabapentin (NEURONTIN) 600 MG tablet Take 1 tablet (600 mg total) by mouth 3 (three) times daily. 90 tablet 3   glucose blood test strip Use as directed up to four times daily. 100 each 0   guaiFENesin-dextromethorphan (ROBITUSSIN DM) 100-10 MG/5ML syrup Take 15 mLs by mouth every 4 (four) hours as needed for cough. 118 mL 0   insulin aspart (NOVOLOG) 100 UNIT/ML injection Inject 0-15 Units into the skin 3 (three) times daily with meals. 10 mL 11   insulin aspart (NOVOLOG) 100 UNIT/ML injection Inject 4 Units into the skin 3 (three) times daily with meals. 10 mL 11   insulin glargine-yfgn (SEMGLEE) 100 UNIT/ML injection Inject 0.2 mLs (20 Units total) into the skin at bedtime. 10 mL 11   Insulin Pen Needle 32G X 4 MM MISC Use as directed with insulin pens. 200 each 0   metFORMIN (GLUCOPHAGE) 500 MG tablet Take 1 tablet (500 mg total) by mouth 2 (two) times daily with a meal. 60 tablet 11   methocarbamol (ROBAXIN) 500 MG tablet Take 1 tablet  (500 mg total) by mouth every 6 (six) hours as needed for muscle spasms. 30 tablet 0   Multiple Vitamin (MULTIVITAMIN WITH MINERALS) TABS tablet Take 1 tablet by mouth daily. 30 tablet 0   nicotine (NICODERM CQ - DOSED IN MG/24 HOURS) 21 mg/24hr patch Place 1 patch (21 mg total) onto the skin daily. 28 patch 0   ondansetron (ZOFRAN) 4 MG/2ML SOLN injection Inject 2 mLs (4 mg total) into the vein every 6 (six) hours as needed for nausea or vomiting. 2 mL 0   polyethylene glycol (MIRALAX / GLYCOLAX) 17 g packet Take 17 g by mouth daily as needed for mild constipation. 14 each 0   Vitamin D, Ergocalciferol, (DRISDOL) 1.25 MG (50000 UNIT) CAPS capsule Take 1 capsule (50,000 Units total) by mouth every 7 (seven) days. (Patient taking differently: Take 50,000 Units by mouth every Thursday.) 5  capsule 0      Home:     Functional History:    Functional Status:  Mobility:          ADL:    Cognition:      Physical Exam: There were no vitals taken for this visit. Physical Exam  General: No acute distress mildly lethargic Mood and affect are appropriate Heart: Regular rate and rhythm no rubs murmurs or extra sounds Lungs: Clear to auscultation, breathing unlabored, no rales or wheezes Abdomen: Positive bowel sounds, soft nontender to palpation, nondistended Extremities: No clubbing, cyanosis, or edema Skin: No evidence of breakdown, no evidence of rash Neurologic: Cranial nerves II through XII intact, motor strength is 5/5 in bilateral deltoid, bicep, tricep, grip, left hip flexor, knee extensors, ankle dorsiflexor and plantar flexor, right lower extremity is status post BKA with wound VAC not tested Sensory exam normal sensation to light touch  in bilateral upper and lower extremities Cerebellar exam normal finger to nose to finger as well as heel to shin in bilateral upper and left lower extremities Musculoskeletal: Full range of motion in all 4 extremities. No joint  swelling  Results for orders placed or performed during the hospital encounter of 09/27/21 (from the past 48 hour(s))  Glucose, capillary     Status: Abnormal   Collection Time: 09/28/21  9:28 PM  Result Value Ref Range   Glucose-Capillary 151 (H) 70 - 99 mg/dL    Comment: Glucose reference range applies only to samples taken after fasting for at least 8 hours.  CBC     Status: Abnormal   Collection Time: 09/29/21  1:55 AM  Result Value Ref Range   WBC 12.7 (H) 4.0 - 10.5 K/uL   RBC 3.18 (L) 4.22 - 5.81 MIL/uL   Hemoglobin 10.0 (L) 13.0 - 17.0 g/dL   HCT 30.0 (L) 39.0 - 52.0 %   MCV 94.3 80.0 - 100.0 fL   MCH 31.4 26.0 - 34.0 pg   MCHC 33.3 30.0 - 36.0 g/dL   RDW 13.1 11.5 - 15.5 %   Platelets 214 150 - 400 K/uL   nRBC 0.0 0.0 - 0.2 %    Comment: Performed at [facility], [place] [address] [place], [place] [address]  Glucose, capillary     Status: Abnormal   Collection Time: 09/29/21  7:22 AM  Result Value Ref Range   Glucose-Capillary 145 (H) 70 - 99 mg/dL    Comment: Glucose reference range applies only to samples taken after fasting for at least 8 hours.  Glucose, capillary     Status: Abnormal   Collection Time: 09/29/21 11:46 AM  Result Value Ref Range   Glucose-Capillary 173 (H) 70 - 99 mg/dL    Comment: Glucose reference range applies only to samples taken after fasting for at least 8 hours.  Glucose, capillary     Status: Abnormal   Collection Time: 09/29/21  4:05 PM  Result Value Ref Range   Glucose-Capillary 198 (H) 70 - 99 mg/dL    Comment: Glucose reference range applies only to samples taken after fasting for at least 8 hours.  Glucose, capillary     Status: Abnormal   Collection Time: 09/29/21  9:08 PM  Result Value Ref Range   Glucose-Capillary 166 (H) 70 - 99 mg/dL    Comment: Glucose reference range applies only to samples taken after fasting for at least 8 hours.  Glucose, capillary     Status: Abnormal   Collection Time: 09/30/21  8:19 AM  Result  Value Ref Range   Glucose-Capillary 134 (H) 70 - 99 mg/dL    Comment: Glucose reference range applies only to samples taken after fasting for at least 8 hours.  Glucose, capillary     Status: Abnormal   Collection Time: 09/30/21 12:16 PM  Result Value Ref Range   Glucose-Capillary 319 (H) 70 - 99 mg/dL    Comment: Glucose reference range applies only to samples taken after fasting for at least 8 hours.  Glucose, capillary     Status: None   Collection Time: 09/30/21  4:32 PM  Result Value Ref Range   Glucose-Capillary 94 70 - 99 mg/dL    Comment: Glucose reference range applies only to samples taken after fasting for at least 8 hours.   No results found.    There were no vitals taken for this visit.  Medical Problem List and Plan: 1. Functional deficits secondary to right BKA  -patient may not shower until wound VAC is discontinued  -ELOS/Goals: 5-7, 2.  Antithrombotics: -DVT/anticoagulation:  Pharmaceutical: Lovenox  -antiplatelet therapy: Aspirin 81 mg daily Plavix 75 mg daily for right SFA and right popliteal stents placed in December 2022 3. Pain Management: Oxycodone 10-15 mg every 4 hours.  Robaxin 500 mg p.o. every 6 hours as needed gabapentin 600 mg 3 times daily 4. Mood: Celexa 20 mg/day, Klonopin 0.5 mg takes twice daily at home  -antipsychotic agents: Not applicable 5. Neuropsych: This patient is capable of making decisions on his own behalf. 6. Skin/Wound Care: Has wound VAC for right lower extremity, monitor for signs of skin breakdown left foot and buttocks region, use of sulfate 220 mg daily for 14 days 7. Fluids/Electrolytes/Nutrition: Carb modified diet monitor I's and O's, Ensure twice daily 8.  Constipation related to opioids use MiraLAX as needed 9.  GERD Protonix 40 mg/day  10.  Diabetes metformin 500 mg twice daily,  sliding scale moderate, Semglee 20 units subcu nightly (may need to restart Novalog mealtime 4U  TID)  "I have personally performed a face to  face diagnostic evaluation of this patient.  Additionally, I have reviewed and concur with the physician assistant's documentation above."   Charlett Blake, MD 09/30/2021

## 2021-09-30 NOTE — Discharge Summary (Signed)
Discharge Diagnoses:  Principal Problem:   Below-knee amputation of right lower extremity ([place]) Active Problems:   Dehiscence of amputation stump of right lower extremity ([facility])   Surgeries: Procedure(s): BELOW KNEE AMPUTATION on 09/27/2021    Consultants:   Discharged Condition: Improved  Hospital Course: Cody Pena is an 61 y.o. male who was admitted 09/27/2021 with a chief complaint of osteomyelitis right foot, with a final diagnosis of osteomyelitis right foot.  Patient was brought to the operating room on 09/27/2021 and underwent Procedure(s): BELOW KNEE AMPUTATION.    Patient was given perioperative antibiotics:  Anti-infectives (From admission, onward)    Start     Dose/Rate Route Frequency Ordered Stop   09/27/21 1400  piperacillin-tazobactam (ZOSYN) IVPB 3.375 g        3.375 g 12.5 mL/hr over 240 Minutes Intravenous Every 8 hours 09/27/21 1343 09/28/21 0921   09/27/21 0745  clindamycin (CLEOCIN) IVPB 900 mg        900 mg 100 mL/hr over 30 Minutes Intravenous On call to O.R. 09/27/21 3570 09/27/21 1779     .  Patient was given sequential compression devices, early ambulation, and aspirin for DVT prophylaxis.  Recent vital signs: Patient Vitals for the past 24 hrs:  BP Temp Temp src Pulse Resp SpO2  09/30/21 0714 (!) 105/58 98.2 F (36.8 C) Oral 72 16 97 %  09/30/21 0412 105/64 99.6 F (37.6 C) Oral 79 16 92 %  09/29/21 2033 105/63 98.5 F (36.9 C) Oral 71 -- 98 %  .  Recent laboratory studies: No results found.  Discharge Medications:   Allergies as of 09/30/2021       Reactions   Ceftriaxone Itching, Nausea And Vomiting, Rash   Noted minutes after starting medication        Medication List     STOP taking these medications    amoxicillin-clavulanate 875-125 MG tablet Commonly known as: Augmentin   Basaglar KwikPen 100 UNIT/ML   citalopram 20 MG tablet Commonly known as: CELEXA   ibuprofen 200 MG tablet Commonly known as: ADVIL   insulin  lispro 100 UNIT/ML KwikPen Commonly known as: HUMALOG   oxyCODONE-acetaminophen 10-325 MG tablet Commonly known as: PERCOCET       TAKE these medications    Accu-Chek Guide w/Device Kit Use up to four times daily as directed.   Accu-Chek Softclix Lancets lancets Use up to four times daily as directed.   albuterol 108 (90 Base) MCG/ACT inhaler Commonly known as: VENTOLIN HFA Inhale 2 puffs into the lungs every 6 (six) hours as needed for shortness of breath.   alum & mag hydroxide-simeth 200-200-20 MG/5ML suspension Commonly known as: MAALOX/MYLANTA Take 15-30 mLs by mouth every 2 (two) hours as needed for indigestion.   aspirin 81 MG EC tablet Take 1 tablet (81 mg total) by mouth daily. Swallow whole.   atorvastatin 80 MG tablet Commonly known as: LIPITOR Take 1 tablet (80 mg total) by mouth daily. What changed: when to take this   bisacodyl 5 MG EC tablet Commonly known as: DULCOLAX Take 1 tablet (5 mg total) by mouth daily as needed for moderate constipation.   clonazePAM 0.5 MG tablet Commonly known as: KLONOPIN Take 0.5 mg by mouth 3 (three) times daily.   clopidogrel 75 MG tablet Commonly known as: PLAVIX Take 1 tablet (75 mg total) by mouth daily.   docusate sodium 100 MG capsule Commonly known as: COLACE Take 1 capsule (100 mg total) by mouth daily. Start taking on: October 01, 2021   gabapentin 600 MG tablet Commonly known as: NEURONTIN Take 1 tablet (600 mg total) by mouth 3 (three) times daily.   glucose blood test strip Use as directed up to four times daily.   guaiFENesin-dextromethorphan 100-10 MG/5ML syrup Commonly known as: ROBITUSSIN DM Take 15 mLs by mouth every 4 (four) hours as needed for cough.   insulin aspart 100 UNIT/ML injection Commonly known as: novoLOG Inject 0-15 Units into the skin 3 (three) times daily with meals.   insulin aspart 100 UNIT/ML injection Commonly known as: novoLOG Inject 4 Units into the skin 3 (three)  times daily with meals.   insulin glargine-yfgn 100 UNIT/ML injection Commonly known as: SEMGLEE Inject 0.2 mLs (20 Units total) into the skin at bedtime.   Insulin Pen Needle 32G X 4 MM Misc Use as directed with insulin pens.   metFORMIN 500 MG tablet Commonly known as: Glucophage Take 1 tablet (500 mg total) by mouth 2 (two) times daily with a meal.   methocarbamol 500 MG tablet Commonly known as: ROBAXIN Take 1 tablet (500 mg total) by mouth every 6 (six) hours as needed for muscle spasms.   multivitamin with minerals Tabs tablet Take 1 tablet by mouth daily.   nicotine 21 mg/24hr patch Commonly known as: NICODERM CQ - dosed in mg/24 hours Place 1 patch (21 mg total) onto the skin daily.   ondansetron 4 MG/2ML Soln injection Commonly known as: ZOFRAN Inject 2 mLs (4 mg total) into the vein every 6 (six) hours as needed for nausea or vomiting.   polyethylene glycol 17 g packet Commonly known as: MIRALAX / GLYCOLAX Take 17 g by mouth daily as needed for mild constipation.   Vitamin D (Ergocalciferol) 1.25 MG (50000 UNIT) Caps capsule Commonly known as: DRISDOL Take 1 capsule (50,000 Units total) by mouth every 7 (seven) days. What changed: when to take this        Diagnostic Studies: MR FOOT RIGHT WO CONTRAST  Result Date: 09/17/2021 CLINICAL DATA:  Fevers and chills.  Status post amputation. EXAM: MRI OF THE RIGHT FOREFOOT WITHOUT CONTRAST TECHNIQUE: Multiplanar, multisequence MR imaging of the right forefoot was performed. No intravenous contrast was administered. COMPARISON:  None. FINDINGS: Bones/Joint/Cartilage Transmetatarsal amputation through the metatarsal necks. Bone marrow edema throughout the residual metatarsals which may be reactive secondary to recent amputation versus osteomyelitis. Soft tissue wound overlies the medial aspect of the first metatarsal stump. Complex fluid collection abutting the fifth metatarsal stump measuring 2.2 x 1.5 x 2 cm which may  reflect a postoperative hematoma versus abscess. Normal alignment. No joint effusion. Ligaments Collateral ligaments are intact. Lisfranc ligament is intact. Anterior and posterior talofibular ligaments are intact. Calcaneofibular ligament is intact. Deltoid ligament is intact. Muscles and Tendons Moderate tendinosis of the peroneus longus with an interstitial tear. Mild tendinosis of the peroneus brevis. Flexor, extensor and Achilles tendons are intact. Generalized T2 hyperintense signal throughout the plantar musculature likely neurogenic given the patient's history of diabetes. Soft tissue No other fluid collection or hematoma. No soft tissue mass. Severe soft tissue edema in the lower leg, around the ankle, and extending into the dorsal aspect of the foot as can be seen with cellulitis. IMPRESSION: 1. Soft tissue wound overlies the medial aspect of the first metatarsal stump. Transmetatarsal amputation through the metatarsal necks. Bone marrow edema throughout the residual metatarsals which may be reactive postsurgical changes secondary to recent amputation versus osteomyelitis. Complex fluid collection abutting the fifth metatarsal stump measuring 2.2 x  1.5 x 2 cm which may reflect a postoperative hematoma versus abscess. 2. Moderate tendinosis of the peroneus longus with an interstitial tear. Electronically Signed   By: Kathreen Devoid M.D.   On: 09/17/2021 07:50   DG Foot Complete Right  Result Date: 09/16/2021 CLINICAL DATA:  Pain. Amputation on January 13. Now with fever, whose Ng, and redness at the surgical site. EXAM: RIGHT FOOT COMPLETE - 3+ VIEW COMPARISON:  07/23/2021 FINDINGS: Interval amputation of the forefoot at the mid metatarsal level. Bony margins appear intact. A small amount of sub cutaneous soft tissue gas is demonstrated over the first metatarsal stump, possibly indicating infection or ulceration. Mild degenerative changes in the intertarsal joints. No evidence of acute fracture or  dislocation. IMPRESSION: Forefoot amputation at the mid metatarsal level. Bones appear intact. Soft tissue gas adjacent to the first metatarsal stump suggesting infection. Electronically Signed   By: Lucienne Capers M.D.   On: 09/16/2021 18:06   VAS [place] ABI WITH/WO TBI  Result Date: 09/18/2021  LOWER EXTREMITY DOPPLER STUDY Patient Name:  Cody Pena  Date of Exam:   09/17/2021 Medical Rec #: [id number]        Accession #:    [id number] Date of Birth: 02-23-1961       Patient Gender: M Patient Age:   14 years Exam Location:  [facility] Procedure:      VAS [place] ABI WITH/WO TBI Referring Phys: SARA NEAL --------------------------------------------------------------------------------  Indications: Ulceration, and peripheral artery disease. High Risk Factors: Hypertension, hyperlipidemia, Diabetes, past history of                    smoking, now only vapes. Other Factors: Right transmet amputation 08/25/21.  Vascular Interventions: 08/11/21 Right SFA and above knee popliteal stenting. Limitations: Today's exam was limited due to right transmet amputation. Comparison Study: Prior ABIs done 08/03/21 Performing Technologist: Sharion Dove RVS  Examination Guidelines: A complete evaluation includes at minimum, Doppler waveform signals and systolic blood pressure reading at the level of bilateral brachial, anterior tibial, and posterior tibial arteries, when vessel segments are accessible. Bilateral testing is considered an integral part of a complete examination. Photoelectric Plethysmograph (PPG) waveforms and toe systolic pressure readings are included as required and additional duplex testing as needed. Limited examinations for reoccurring indications may be performed as noted.  ABI Findings: +---------+------------------+-----+---------+-------------------+  Right     Rt Pressure (mmHg) Index Waveform  Comment              +---------+------------------+-----+---------+-------------------+  Brachial  142                       triphasic                      +---------+------------------+-----+---------+-------------------+  PTA       136                0.96  biphasic                       +---------+------------------+-----+---------+-------------------+  DP        126                0.89  biphasic                       +---------+------------------+-----+---------+-------------------+  Great Toe  transmet amputation  +---------+------------------+-----+---------+-------------------+ +---------+------------------+-----+---------+-------+  Left      Lt Pressure (mmHg) Index Waveform  Comment  +---------+------------------+-----+---------+-------+  Brachial  130                      triphasic          +---------+------------------+-----+---------+-------+  PTA       94                 0.66  biphasic           +---------+------------------+-----+---------+-------+  DP        59                 0.42  biphasic           +---------+------------------+-----+---------+-------+  Great Toe 45                 0.32                     +---------+------------------+-----+---------+-------+ +-------+-----------+-----------+------------+------------+  ABI/TBI Today's ABI Today's TBI Previous ABI Previous TBI  +-------+-----------+-----------+------------+------------+  Right   0.96        amp         0.67         0.40          +-------+-----------+-----------+------------+------------+  Left    0.66        0.32        0.76         0.58          +-------+-----------+-----------+------------+------------+ Right ABIs appear increased compared to prior study on 08/03/21. Left ABIs and TBIs appear decreased compared to prior study on 08/03/21.  Summary: Right: Resting right ankle-brachial index is within normal range. No evidence of significant right lower extremity arterial disease. Left: Resting left ankle-brachial index indicates moderate left lower extremity arterial disease. The left toe-brachial index  is abnormal.  *See table(s) above for measurements and observations.  Electronically signed by Orlie Pollen on 09/18/2021 at 7:45:31 PM.    Final    [place] EKG SITE RITE  Result Date: 09/19/2021 If Site Rite image not attached, placement could not be confirmed due to current cardiac rhythm.   Patient benefited maximally from their hospital stay and there were no complications.     Disposition: Discharge disposition: 02-Transferred to [facility]      Discharge Instructions     Call MD / Call 911   Complete by: As directed    If you experience chest pain or shortness of breath, CALL 911 and be transported to the hospital emergency room.  If you develope a fever above 101 F, pus (white drainage) or increased drainage or redness at the wound, or calf pain, call your surgeon's office.   Call MD / Call 911   Complete by: As directed    If you experience chest pain or shortness of breath, CALL 911 and be transported to the hospital emergency room.  If you develope a fever above 101 F, pus (white drainage) or increased drainage or redness at the wound, or calf pain, call your surgeon's office.   Constipation Prevention   Complete by: As directed    Drink plenty of fluids.  Prune juice may be helpful.  You may use a stool softener, such as Colace (over the counter) 100 mg twice a day.  Use MiraLax (over the counter) for constipation as needed.   Constipation Prevention  Complete by: As directed    Drink plenty of fluids.  Prune juice may be helpful.  You may use a stool softener, such as Colace (over the counter) 100 mg twice a day.  Use MiraLax (over the counter) for constipation as needed.   Diet - low sodium heart healthy   Complete by: As directed    Diet - low sodium heart healthy   Complete by: As directed    Increase activity slowly as tolerated   Complete by: As directed    Increase activity slowly as tolerated   Complete by: As directed    Negative Pressure Wound Therapy -  Incisional   Complete by: As directed    Post-operative opioid taper instructions:   Complete by: As directed    POST-OPERATIVE OPIOID TAPER INSTRUCTIONS: It is important to wean off of your opioid medication as soon as possible. If you do not need pain medication after your surgery it is ok to stop day one. Opioids include: Codeine, Hydrocodone(Norco, Vicodin), Oxycodone(Percocet, oxycontin) and hydromorphone amongst others.  Long term and even short term use of opiods can cause: Increased pain response Dependence Constipation Depression Respiratory depression And more.  Withdrawal symptoms can include Flu like symptoms Nausea, vomiting And more Techniques to manage these symptoms Hydrate well Eat regular healthy meals Stay active Use relaxation techniques(deep breathing, meditating, yoga) Do Not substitute Alcohol to help with tapering If you have been on opioids for less than two weeks and do not have pain than it is ok to stop all together.  Plan to wean off of opioids This plan should start within one week post op of your joint replacement. Maintain the same interval or time between taking each dose and first decrease the dose.  Cut the total daily intake of opioids by one tablet each day Next start to increase the time between doses. The last dose that should be eliminated is the evening dose.      Post-operative opioid taper instructions:   Complete by: As directed    POST-OPERATIVE OPIOID TAPER INSTRUCTIONS: It is important to wean off of your opioid medication as soon as possible. If you do not need pain medication after your surgery it is ok to stop day one. Opioids include: Codeine, Hydrocodone(Norco, Vicodin), Oxycodone(Percocet, oxycontin) and hydromorphone amongst others.  Long term and even short term use of opiods can cause: Increased pain response Dependence Constipation Depression Respiratory depression And more.  Withdrawal symptoms can include Flu  like symptoms Nausea, vomiting And more Techniques to manage these symptoms Hydrate well Eat regular healthy meals Stay active Use relaxation techniques(deep breathing, meditating, yoga) Do Not substitute Alcohol to help with tapering If you have been on opioids for less than two weeks and do not have pain than it is ok to stop all together.  Plan to wean off of opioids This plan should start within one week post op of your joint replacement. Maintain the same interval or time between taking each dose and first decrease the dose.  Cut the total daily intake of opioids by one tablet each day Next start to increase the time between doses. The last dose that should be eliminated is the evening dose.          Follow-up Information     Newt Minion, MD Follow up in 1 week(s).   Specialty: Orthopedic Surgery Contact information: [address] [place] [address] [phone]  Signed: Newt Minion 09/30/2021, 11:09 AM

## 2021-10-01 LAB — GLUCOSE, CAPILLARY
Glucose-Capillary: 172 mg/dL — ABNORMAL HIGH (ref 70–99)
Glucose-Capillary: 165 mg/dL — ABNORMAL HIGH (ref 70–99)
Glucose-Capillary: 133 mg/dL — ABNORMAL HIGH (ref 70–99)
Glucose-Capillary: 216 mg/dL — ABNORMAL HIGH (ref 70–99)

## 2021-10-01 MED ORDER — NICOTINE 21 MG/24HR TD PT24
21.0000 mg | MEDICATED_PATCH | Freq: Every day | TRANSDERMAL | Status: DC
  Administered 2021-10-01 – 2021-10-06 (×6): 21 mg via TRANSDERMAL
  Filled 2021-10-01 (×6): qty 1

## 2021-10-01 MED ORDER — ATORVASTATIN CALCIUM 80 MG PO TABS
80.0000 mg | ORAL_TABLET | Freq: Every day | ORAL | Status: DC
  Administered 2021-10-01 – 2021-10-06 (×6): 80 mg via ORAL
  Filled 2021-10-01 (×6): qty 1

## 2021-10-01 MED ORDER — ASPIRIN 81 MG PO CHEW
81.0000 mg | CHEWABLE_TABLET | Freq: Every day | ORAL | Status: DC
  Administered 2021-10-01 – 2021-10-06 (×6): 81 mg via ORAL
  Filled 2021-10-01 (×6): qty 1

## 2021-10-01 NOTE — Progress Notes (Signed)
PROGRESS NOTE   Subjective/Complaints:  Slept ok last noc , no pain c/os , discussed usual timeframes for wound vac removal , staple removal and prosthetic fitting.  No longer using IV  Wife at bedside   ROS- neg CP, SOB, N/V/D  Objective:   No results found. Recent Labs    09/29/21 0155  WBC 12.7*  HGB 10.0*  HCT 30.0*  PLT 214   Recent Labs    09/30/21 1833  NA 130*  K 4.6  CL 95*  CO2 26  GLUCOSE 249*  BUN 15  CREATININE 0.95  CALCIUM 7.9*    Intake/Output Summary (Last 24 hours) at 10/01/2021 0726 Last data filed at 09/30/2021 2041 Gross per 24 hour  Intake --  Output 2 ml  Net -2 ml        Physical Exam: Vital Signs Blood pressure 117/69, pulse 83, temperature 98.5 F (36.9 C), resp. rate 16, SpO2 95 %.   General: No acute distress Mood and affect are appropriate Heart: Regular rate and rhythm no rubs murmurs or extra sounds Lungs: Clear to auscultation, breathing unlabored, no rales or wheezes Abdomen: Positive bowel sounds, soft nontender to palpation, nondistended Extremities: No clubbing, cyanosis, or edema Skin: No evidence of breakdown, no evidence of rash Neurologic: Cranial nerves II through XII intact, motor strength is 5/5 in bilateral deltoid, bicep, tricep, grip, Left hip flexor, knee extensors, ankle dorsiflexor and plantar flexor RLE NT due to BKA and wound vac   Musculoskeletal: reduced Left ankle DF /PF ROM  No joint swelling   Assessment/Plan: 1. Functional deficits which require 3+ hours per day of interdisciplinary therapy in a comprehensive inpatient rehab setting. Physiatrist is providing close team supervision and 24 hour management of active medical problems listed below. Physiatrist and rehab team continue to assess barriers to discharge/monitor patient progress toward functional and medical goals  Care Tool:  Bathing              Bathing assist        Upper Body Dressing/Undressing Upper body dressing   What is the patient wearing?: Hospital gown only    Upper body assist Assist Level: Minimal Assistance - Patient > 75%    Lower Body Dressing/Undressing Lower body dressing            Lower body assist       Toileting Toileting    Toileting assist Assist for toileting: Supervision/Verbal cueing     Transfers Chair/bed transfer  Transfers assist     Chair/bed transfer assist level: Moderate Assistance - Patient 50 - 74%     Locomotion Ambulation   Ambulation assist              Walk 10 feet activity   Assist           Walk 50 feet activity   Assist           Walk 150 feet activity   Assist           Walk 10 feet on uneven surface  activity   Assist           Wheelchair     Assist  Wheelchair 50 feet with 2 turns activity    Assist            Wheelchair 150 feet activity     Assist          Blood pressure 117/69, pulse 83, temperature 98.5 F (36.9 C), resp. rate 16, SpO2 95 %.  Medical Problem List and Plan: 1. Functional deficits secondary to right BKA             -patient may not shower until wound VAC is discontinued             -ELOS/Goals: 5-7, 2.  Antithrombotics: -DVT/anticoagulation:  Pharmaceutical: Lovenox             -antiplatelet therapy: Aspirin 81 mg daily Plavix 75 mg daily for right SFA and right popliteal stents placed in December 2022 3. Pain Management: Oxycodone 10-15 mg every 4 hours.  Robaxin 500 mg p.o. every 6 hours as needed gabapentin 600 mg 3 times daily 4. Mood: Celexa 20 mg/day, Klonopin 0.5 mg takes twice daily at home             -antipsychotic agents: Not applicable 5. Neuropsych: This patient is capable of making decisions on his own behalf. 6. Skin/Wound Care: Has wound VAC for right lower extremity, monitor for signs of skin breakdown left foot and buttocks region, use of sulfate 220 mg  daily for 14 days 7. Fluids/Electrolytes/Nutrition: Carb modified diet monitor I's and O's, Ensure twice daily 8.  Constipation related to opioids use MiraLAX as needed 9.  GERD Protonix 40 mg/day  10.  Diabetes metformin 500 mg twice daily,  sliding scale moderate, Semglee 20 units subcu nightly (may need to restart Novalog mealtime 4U  TID)  CBG (last 3)  Recent Labs    09/30/21 1632 09/30/21 2134 10/01/21 0631  GLUCAP 94 201* 172*   11.  Mild hypoalbuminemia- cont prosource and ensure supplements 12.  Mild hyponatremia , may be IV related asymptomatic will monitor  13.  ABLA - has had 2 surgeries in last month hgb prior to TMA was 14 but fluctuating in 10-11 gm range since that time    LOS: 1 days A FACE TO FACE EVALUATION WAS PERFORMED  Cody Pena 10/01/2021, 7:26 AM

## 2021-10-01 NOTE — Consult Note (Signed)
WOC Nurse Consult Note: Reason for Consult: left foot, 3rd metatarsal head on plantar aspect with periwound callus and wound in center. Has not fully healed since 2019. Wound type: neuropathic Pressure Injury POA: N/A Measurement: 0.4cm round full thickness wound near crease in foot with 1.5cm circumferential callus formation. Wound WCH:ENID, dry Drainage (amount, consistency, odor) None Periwound: as noted above with callus Dressing procedure/placement/frequency: I recommend padding the wound and callus with a silicone foam bandage. Recommend that Dr. Lajoyce Corners or the provider of his choosing pare the callus when patient is next in the office for follow up if not done here in house.  WOC nursing team will not follow, but will remain available to this patient, the nursing and medical teams.  Please re-consult if needed. Thanks, Ladona Mow, MSN, RN, GNP, [facility]  Pager# [phone]

## 2021-10-01 NOTE — Plan of Care (Signed)
°  Problem: RH Balance Goal: LTG Patient will maintain dynamic standing with ADLs (OT) Description: LTG:  Patient will maintain dynamic standing balance with assist during activities of daily living (OT)  Flowsheets (Taken 10/01/2021 1254) LTG: Pt will maintain dynamic standing balance during ADLs with: Supervision/Verbal cueing   Problem: Sit to Stand Goal: LTG:  Patient will perform sit to stand in prep for activites of daily living with assistance level (OT) Description: LTG:  Patient will perform sit to stand in prep for activites of daily living with assistance level (OT) Flowsheets (Taken 10/01/2021 1254) LTG: PT will perform sit to stand in prep for activites of daily living with assistance level: Supervision/Verbal cueing   Problem: RH Grooming Goal: LTG Patient will perform grooming w/assist,cues/equip (OT) Description: LTG: Patient will perform grooming with assist, with/without cues using equipment (OT) Flowsheets (Taken 10/01/2021 1254) LTG: Pt will perform grooming with assistance level of: Set up assist    Problem: RH Bathing Goal: LTG Patient will bathe all body parts with assist levels (OT) Description: LTG: Patient will bathe all body parts with assist levels (OT) Flowsheets (Taken 10/01/2021 1254) LTG: Pt will perform bathing with assistance level/cueing: Supervision/Verbal cueing   Problem: RH Dressing Goal: LTG Patient will perform lower body dressing w/assist (OT) Description: LTG: Patient will perform lower body dressing with assist, with/without cues in positioning using equipment (OT) Flowsheets (Taken 10/01/2021 1254) LTG: Pt will perform lower body dressing with assistance level of: Supervision/Verbal cueing   Problem: RH Toileting Goal: LTG Patient will perform toileting task (3/3 steps) with assistance level (OT) Description: LTG: Patient will perform toileting task (3/3 steps) with assistance level (OT)  Flowsheets (Taken 10/01/2021 1254) LTG: Pt will perform  toileting task (3/3 steps) with assistance level: Supervision/Verbal cueing   Problem: RH Toilet Transfers Goal: LTG Patient will perform toilet transfers w/assist (OT) Description: LTG: Patient will perform toilet transfers with assist, with/without cues using equipment (OT) Flowsheets (Taken 10/01/2021 1254) LTG: Pt will perform toilet transfers with assistance level of: Supervision/Verbal cueing

## 2021-10-01 NOTE — Progress Notes (Signed)
Inpatient Rehabilitation Admission Medication Review by a Pharmacist  A complete drug regimen review was completed for this patient to identify any potential clinically significant medication issues.  High Risk Drug Classes Is patient taking? Indication by Medication  Antipsychotic No   Anticoagulant Yes Enoxaparin for VTE prophylaxis  Antibiotic No   Opioid No Oxycodone 15 mg q4h prn severe pain  Antiplatelet Yes Plavix  Hypoglycemics/insulin Yes SSI, Semglee, Metformin for glucose control  Vasoactive Medication No   Chemotherapy No   Other Yes Citalopram and Clonazepam - mood stabilization Gabapentin - neuropathic pain Zinc - supplement for 14 days Pantoprazole - GERD     Type of Medication Issue Identified Description of Issue Recommendation(s)  Additional Drug Therapy Needed  Multivitamin, docusate, meal coverage insulin, weekly Vitamin D Add/Resume medications on as appropriate  Significant med changes from prior encounter (inform family/care partners about these prior to discharge). Was taking Clonazepam 0.5 mg TID on inpatient unit > changed to BID on transfer.  Monitor for any need to adjust dosing frequency.      Other       Clinically significant medication issues were identified that warrant physician communication and completion of prescribed/recommended actions by midnight of the next day:  No   Pharmacist comments:  Medication discrepancies identified on admission have been resolved  Time spent performing this drug regimen review (minutes):  15 min  Thank you for involving pharmacy in this patient's care.  Arnette Felts, PharmD PGY1 Ambulatory Care Pharmacy Resident 10/01/2021 12:46 PM  **Pharmacist phone directory can be found on [email or web address] listed under [facility]**

## 2021-10-01 NOTE — Plan of Care (Signed)
°  Problem: RH Balance Goal: LTG Patient will maintain dynamic sitting balance (PT) Description: LTG:  Patient will maintain dynamic sitting balance with assistance during mobility activities (PT) Flowsheets (Taken 10/01/2021 1642) LTG: Pt will maintain dynamic sitting balance during mobility activities with:: Independent with assistive device  Goal: LTG Patient will maintain dynamic standing balance (PT) Description: LTG:  Patient will maintain dynamic standing balance with assistance during mobility activities (PT) Flowsheets (Taken 10/01/2021 1642) LTG: Pt will maintain dynamic standing balance during mobility activities with:: Supervision/Verbal cueing   Problem: Sit to Stand Goal: LTG:  Patient will perform sit to stand with assistance level (PT) Description: LTG:  Patient will perform sit to stand with assistance level (PT) Flowsheets (Taken 10/01/2021 1642) LTG: PT will perform sit to stand in preparation for functional mobility with assistance level: Supervision/Verbal cueing   Problem: RH Bed Mobility Goal: LTG Patient will perform bed mobility with assist (PT) Description: LTG: Patient will perform bed mobility with assistance, with/without cues (PT). Flowsheets (Taken 10/01/2021 1642) LTG: Pt will perform bed mobility with assistance level of: Independent with assistive device    Problem: RH Bed to Chair Transfers Goal: LTG Patient will perform bed/chair transfers w/assist (PT) Description: LTG: Patient will perform bed to chair transfers with assistance (PT). Flowsheets (Taken 10/01/2021 1642) LTG: Pt will perform Bed to Chair Transfers with assistance level: Supervision/Verbal cueing   Problem: RH Car Transfers Goal: LTG Patient will perform car transfers with assist (PT) Description: LTG: Patient will perform car transfers with assistance (PT). Flowsheets (Taken 10/01/2021 1642) LTG: Pt will perform car transfers with assist:: Supervision/Verbal cueing   Problem: RH  Ambulation Goal: LTG Patient will ambulate in controlled environment (PT) Description: LTG: Patient will ambulate in a controlled environment, # of feet with assistance (PT). Flowsheets (Taken 10/01/2021 1642) LTG: Pt will ambulate in controlled environ  assist needed:: Supervision/Verbal cueing LTG: Ambulation distance in controlled environment: 75 ft with LRAD Goal: LTG Patient will ambulate in home environment (PT) Description: LTG: Patient will ambulate in home environment, # of feet with assistance (PT). Flowsheets (Taken 10/01/2021 1642) LTG: Pt will ambulate in home environ  assist needed:: Supervision/Verbal cueing LTG: Ambulation distance in home environment: 50 ft with LRAD   Problem: RH Wheelchair Mobility Goal: LTG Patient will propel w/c in controlled environment (PT) Description: LTG: Patient will propel wheelchair in controlled environment, # of feet with assist (PT) Flowsheets (Taken 10/01/2021 1642) LTG: Pt will propel w/c in controlled environ  assist needed:: Independent with assistive device LTG: Propel w/c distance in controlled environment: 150 ft Goal: LTG Patient will propel w/c in home environment (PT) Description: LTG: Patient will propel wheelchair in home environment, # of feet with assistance (PT). Flowsheets (Taken 10/01/2021 1642) LTG: Pt will propel w/c in home environ  assist needed:: Independent with assistive device LTG: Propel w/c distance in home environment: 75 ft

## 2021-10-01 NOTE — Plan of Care (Signed)
°  Problem: Consults Goal: RH LIMB LOSS PATIENT EDUCATION Description: Description: See Patient Education module for eduction specifics. Outcome: Progressing Goal: Skin Care Protocol Initiated - if Braden Score 18 or less Description: If consults are not indicated, leave blank or document N/A Outcome: Progressing   Problem: RH BOWEL ELIMINATION Goal: RH STG MANAGE BOWEL WITH ASSISTANCE Description: STG Manage Bowel with min Assistance. Outcome: Progressing Goal: RH STG MANAGE BOWEL W/MEDICATION W/ASSISTANCE Description: STG Manage Bowel with Medication with mod I Assistance. Outcome: Progressing   Problem: RH BLADDER ELIMINATION Goal: RH STG MANAGE BLADDER WITH ASSISTANCE Description: STG Manage Bladder With min Assistance Outcome: Progressing   Problem: RH SKIN INTEGRITY Goal: RH STG SKIN FREE OF INFECTION/BREAKDOWN Outcome: Progressing Goal: RH STG MAINTAIN SKIN INTEGRITY WITH ASSISTANCE Description: STG Maintain Skin Integrity With mod Assistance. Outcome: Progressing Goal: RH STG ABLE TO PERFORM INCISION/WOUND CARE W/ASSISTANCE Description: STG Able To Perform Incision/Wound Care With Assistance. Outcome: Progressing   Problem: RH SAFETY Goal: RH STG ADHERE TO SAFETY PRECAUTIONS W/ASSISTANCE/DEVICE Description: STG Adhere to Safety Precautions With supervision Assistance/Device. Outcome: Progressing   Problem: RH PAIN MANAGEMENT Goal: RH STG PAIN MANAGED AT OR BELOW PT'S PAIN GOAL Description: Pain < 2 on pain scale Outcome: Progressing   Problem: RH KNOWLEDGE DEFICIT LIMB LOSS Goal: RH STG INCREASE KNOWLEDGE OF SELF CARE AFTER LIMB LOSS Outcome: Progressing

## 2021-10-01 NOTE — Evaluation (Signed)
Physical Therapy Assessment and Plan  Patient Details  Name: [facility] MRN: [id number] Date of Birth: [birth year]  PT Diagnosis: Abnormal posture, Abnormality of gait, Difficulty walking, Muscle weakness, and Pain in low back Rehab Potential: Good ELOS: 7-10 days   Today's Date: 10/01/2021 PT Individual Time: 1300-1400 PT Individual Time Calculation (min): 60 min    Hospital Problem: Principal Problem:   Below-knee amputation of right lower extremity ([facility]) Active Problems:   S/P BKA (below knee amputation), right ([facility])   Past Medical History:  Past Medical History:  Diagnosis Date   COVID    has had Covid 3 times, not sick with it   Depression    Diabetes mellitus without complication ([facility])    pt states he has now been told that he is a type 1 diabetic.   Hypertension    Peripheral vascular disease ([facility])    Past Surgical History:  Past Surgical History:  Procedure Laterality Date   ABDOMINAL AORTOGRAM W/LOWER EXTREMITY N/A 08/11/2021   Procedure: ABDOMINAL AORTOGRAM W/LOWER EXTREMITY;  Surgeon: Cherre Robins, MD;  Location: [facility];  Service: Cardiovascular;  Laterality: N/A;   AMPUTATION Left 11/15/2017   Procedure: LEFT FOOT 2ND RAY AMPUTATION;  Surgeon: Newt Minion, MD;  Location: [facility];  Service: Orthopedics;  Laterality: Left;   AMPUTATION Right 08/25/2021   Procedure: RIGHT TRANSMETATARSAL AMPUTATION;  Surgeon: Newt Minion, MD;  Location: [facility];  Service: Orthopedics;  Laterality: Right;   AMPUTATION Right 09/27/2021   Procedure: BELOW KNEE AMPUTATION;  Surgeon: Newt Minion, MD;  Location: [facility];  Service: Orthopedics;  Laterality: Right;   CERVICAL FUSION     IR ANGIOGRAM EXTREMITY LEFT  11/14/2017   IR ANGIOGRAM SELECTIVE EACH ADDITIONAL VESSEL  11/14/2017   IR ANGIOGRAM SELECTIVE EACH ADDITIONAL VESSEL  11/14/2017   IR TIB-PERO ART PTA MOD SED  11/14/2017   IR TIB-PERO ART UNI PTA EA ADD VESSEL MOD SED  11/14/2017   IR US GUIDE VASC ACCESS LEFT   11/14/2017   PERIPHERAL VASCULAR INTERVENTION Right 08/11/2021   Procedure: PERIPHERAL VASCULAR INTERVENTION;  Surgeon: Cherre Robins, MD;  Location: [facility];  Service: Cardiovascular;  Laterality: Right;  rt sfa stent    Assessment & Plan Clinical Impression:  61 year old male with history of severe peripheral artery disease, diabetes, hypertension, chronic low back pain and depression who was admitted for dehiscence of TMA of the right foot on 09/27/2021.  The patient had a right transmetatarsal amputation on 08/25/2021 for a nonhealing diabetic ulcer and had postoperative wound infection.  The patient's blood sugar was up at 252 on admission he was managed with [facility] and metformin.  He was switched to [facility].  His hypertension was well controlled.  Recent arteriogram on 08/11/2021 showed severe right SFA and popliteal occlusion and underwent stenting at that time.  He has recently given up smoking.  The patient is on chronic narcotic analgesic for back pain and takes approximately 5-6 [facility] 10 mg tablets per day.  He has chronic anxiety and has been taking [facility] 0.5 twice daily on average. Patient transferred to CIR on 09/30/2021 .   Patient currently requires min with mobility secondary to muscle weakness, decreased cardiorespiratoy endurance, and decreased sitting balance, decreased standing balance, decreased postural control, decreased balance strategies, and difficulty maintaining precautions.  Prior to hospitalization, patient was modified independent  with mobility and lived with Spouse in a House home.  Home access is 4Ramped entrance (per  pt ramp will be installed before d/c home).  Patient will benefit from skilled PT intervention to maximize safe functional mobility, minimize fall risk, and decrease caregiver burden for planned discharge home with 24 hour supervision.  Anticipate patient will benefit from follow up [facility] at discharge.  PT - End of Session Activity  Tolerance: Tolerates 30+ min activity with multiple rests Endurance Deficit: Yes Endurance Deficit Description: frequent rest breaks during functional activity PT Assessment Rehab Potential (ACUTE/IP ONLY): Good PT Barriers to Discharge: Weight bearing restrictions PT Patient demonstrates impairments in the following area(s): Balance;Endurance;Safety;Sensory;Skin Integrity PT Transfers Functional Problem(s): Bed Mobility;Bed to Chair;Car;Furniture;Floor PT Locomotion Functional Problem(s): Ambulation;Wheelchair Mobility;Stairs PT Plan PT Intensity: Minimum of 1-2 x/day ,45 to 90 minutes PT Frequency: 5 out of 7 days PT Duration Estimated Length of Stay: 7-10 days PT Treatment/Interventions: Ambulation/gait training;Balance/vestibular training;Community reintegration;Discharge planning;Disease management/prevention;DME/adaptive equipment instruction;Functional mobility training;Pain management;Patient/family education;Psychosocial support;Skin care/wound management;Splinting/orthotics;Stair training;Therapeutic Activities;UE/LE Strength taining/ROM;Therapeutic Exercise;UE/LE Coordination activities;Wheelchair propulsion/positioning PT Transfers Anticipated Outcome(s): Supervision PT Locomotion Anticipated Outcome(s): Supervision for short distance ambulation otherwise at w/c level PT Recommendation Follow Up Recommendations: Home health PT Patient destination: Home Equipment Recommended: Rolling walker with 5" wheels;Wheelchair (measurements);Wheelchair cushion (measurements) (18x18 w/c with R residual limb support; pt already owns RW and [facility])   PT Evaluation Precautions/Restrictions Precautions Precautions: Fall Precaution Comments: wound vac on RLE Required Braces or Orthoses: Other Brace Other Brace: RLE limb protector Restrictions Weight Bearing Restrictions: Yes RLE Weight Bearing: Non weight bearing General   Vital Signs  Pain Pain Assessment Pain Scale: 0-10 Pain Score: 8   Pain Type: Chronic pain Pain Location: Back Pain Orientation: Lower Pain Descriptors / Indicators: Aching Pain Frequency: Constant Pain Onset: On-going Pain Intervention(s): Medication (See eMAR) Pain Interference Pain Interference Pain Effect on Sleep: 2. Occasionally Pain Interference with Therapy Activities: 2. Occasionally Pain Interference with Day-to-Day Activities: 2. Occasionally Home Living/Prior Functioning Home Living Available Help at Discharge: Family;Available 24 hours/day Type of Home: House Home Access: Ramped entrance (per pt ramp will be installed before d/c home) Entrance Stairs-Number of Steps: 4 Entrance Stairs-Rails: None Home Layout: One level  Lives With: Spouse Prior Function Level of Independence: Independent with gait;Independent with transfers  Able to Take Stairs?: Yes Driving: Yes Vision/Perception  Perception Perception: Within Functional Limits Praxis Praxis: Intact  Cognition Overall Cognitive Status: Within Functional Limits for tasks assessed Arousal/Alertness: Awake/alert Orientation Level: Oriented X4 Year: 2023 Month: February Day of Week: Correct Attention: Focused;Sustained Focused Attention: Appears intact Sustained Attention: Appears intact Memory: Appears intact Awareness: Impaired Awareness Impairment: Emergent impairment;Anticipatory impairment Problem Solving: Appears intact Safety/Judgment: Impaired Comments: Pt impulsive with functional movement, decreased insight into present deficits which impacts safety awareness Sensation Sensation Light Touch: Impaired Detail Light Touch Impaired Details: Impaired RLE;Impaired LLE (impaired distally LLE) Proprioception: Appears Intact Coordination Gross Motor Movements are Fluid and Coordinated: No Fine Motor Movements are Fluid and Coordinated: Yes Coordination and Movement Description: Coordination affected by limb loss and resultant balance deficits in stance during  functional activity Motor  Motor Motor: Other (comment) Motor - Skilled Clinical Observations: Affected by limb loss and balance deficits   Trunk/Postural Assessment  Cervical Assessment Cervical Assessment: Within Functional Limits Thoracic Assessment Thoracic Assessment: Within Functional Limits Lumbar Assessment Lumbar Assessment: Exceptions to [facility] (posterior pelvic tilt) Postural Control Postural Control: Deficits on evaluation (delayed/insufficient in standing)  Balance Balance Balance Assessed: Yes Static Sitting Balance Static Sitting - Balance Support: No upper extremity supported;Feet supported Static Sitting - Level of Assistance: 5: Stand by assistance  Dynamic Sitting Balance Dynamic Sitting - Balance Support: No upper extremity supported;Feet supported;During functional activity Dynamic Sitting - Level of Assistance: 5: Stand by assistance Static Standing Balance Static Standing - Balance Support: Bilateral upper extremity supported;During functional activity Static Standing - Level of Assistance: 4: Min assist Dynamic Standing Balance Dynamic Standing - Balance Support: Bilateral upper extremity supported;During functional activity Dynamic Standing - Level of Assistance: 4: Min assist Extremity Assessment      RLE Assessment RLE Assessment: Exceptions to [facility] General Strength Comments: R BKA; 4/5 hip flexion LLE Assessment LLE Assessment: Within Functional Limits General Strength Comments: 4 to 5/5 grossly  Care Tool Care Tool Bed Mobility Roll left and right activity   Roll left and right assist level: Supervision/Verbal cueing    Sit to lying activity   Sit to lying assist level: Supervision/Verbal cueing    Lying to sitting on side of bed activity   Lying to sitting on side of bed assist level: the ability to move from lying on the back to sitting on the side of the bed with no back support.: Supervision/Verbal cueing     Care Tool Transfers Sit to  stand transfer   Sit to stand assist level: Minimal Assistance - Patient > 75%    Chair/bed transfer   Chair/bed transfer assist level: Moderate Assistance - Patient 50 - 74%     Psychologist, counselling transfer activity did not occur: Safety/medical concerns        Care Tool Locomotion Ambulation   Assist level: Minimal Assistance - Patient > 75% Assistive device: Walker-rolling Max distance: 50'  Walk 10 feet activity   Assist level: Minimal Assistance - Patient > 75% Assistive device: Walker-rolling   Walk 50 feet with 2 turns activity   Assist level: Minimal Assistance - Patient > 75% Assistive device: Walker-rolling  Walk 150 feet activity Walk 150 feet activity did not occur: Safety/medical concerns      Walk 10 feet on uneven surfaces activity Walk 10 feet on uneven surfaces activity did not occur: Safety/medical concerns      Stairs Stair activity did not occur: Safety/medical concerns        Walk up/down 1 step activity Walk up/down 1 step or curb (drop down) activity did not occur: Safety/medical concerns      Walk up/down 4 steps activity Walk up/down 4 steps activity did not occur: Safety/medical concerns      Walk up/down 12 steps activity Walk up/down 12 steps activity did not occur: Safety/medical concerns      Pick up small objects from floor Pick up small object from the floor (from standing position) activity did not occur: Safety/medical concerns      Wheelchair Is the patient using a wheelchair?: Yes Type of Wheelchair: Manual   Wheelchair assist level: Supervision/Verbal cueing Max wheelchair distance: 150'  Wheel 50 feet with 2 turns activity   Assist Level: Supervision/Verbal cueing  Wheel 150 feet activity   Assist Level: Supervision/Verbal cueing    Refer to Care Plan for Long Term Goals  SHORT TERM GOAL WEEK 1 PT Short Term Goal 1 (Week 1): =LTG due to ELOS  Recommendations for other services: None   Skilled  Therapeutic Intervention Pt received seated EOB in room, agreeable to therapy evaluation. Pt reports ongoing chronic back pain, no other complaints of pain and declines intervention during session. Obtained 18x18 w/c and appropriate leg supports for patient. Sit to stand with min  A to RW with cues for safe hand placement. Stand pivot transfer with RW and min to mod A during session due to uncontrolled descent several times into w/c when reaching back for chair. Pt with some impulsivity and decreased safety awareness. Manual w/c propulsion up to 150 ft with use of BUE at Supervision level, assist needed for management of w/c parts. Ambulation x 50 ft with RW and min A for balance, "hopping" gait pattern. Pt lethargic and falling asleep during session due to side effects of medication taken prior to start of therapy session. Pt agreeable to return to bed at end of session to rest. Pt attempts to stand to RW but unable to achieve stance due to fatigue. Without letting therapist know pt performs squat pivot transfer w/c to bed with min A for safety. Educated pt on importance of letting his caregiver or whoever is assisting him with transfer know prior to initiation of transfer so that equipment can be set up safely. Pt returned to supine with Supervision. Pt left seated in bed with needs in reach, bed alarm in place, wife present.   Mobility Bed Mobility Bed Mobility: Rolling Right;Rolling Left;Supine to Sit;Sit to Supine Rolling Right: Supervision/verbal cueing Rolling Left: Supervision/Verbal cueing Supine to Sit: Supervision/Verbal cueing Sit to Supine: Supervision/Verbal cueing Transfers Transfers: Sit to Stand;Stand Pivot Transfers;Squat Pivot Transfers Sit to Stand: Minimal Assistance - Patient > 75% Stand Pivot Transfers: Moderate Assistance - Patient 50 - 74% Stand Pivot Transfer Details: Verbal cues for sequencing;Verbal cues for technique;Verbal cues for precautions/safety;Verbal cues for safe  use of DME/AE;Manual facilitation for weight shifting Squat Pivot Transfers: Minimal Assistance - Patient > 75% Transfer (Assistive device): Rolling walker Locomotion  Gait Gait Distance (Feet): 50 Feet Assistive device: Rolling walker Gait Gait Pattern: Impaired (hopping on LLE) Gait velocity: decreased Stairs / Additional Locomotion Stairs: No Wheelchair Mobility Wheelchair Mobility: Yes Wheelchair Assistance: Chartered loss adjuster: Both upper extremities Wheelchair Parts Management: Needs assistance Distance: 150   Discharge Criteria: Patient will be discharged from PT if patient refuses treatment 3 consecutive times without medical reason, if treatment goals not met, if there is a change in medical status, if patient makes no progress towards goals or if patient is discharged from hospital.    The above assessment, treatment plan, treatment alternatives and goals were discussed and mutually agreed upon: by patient   Excell Seltzer, PT, DPT, CSRS 10/01/2021, 4:38 PM

## 2021-10-02 ENCOUNTER — Other Ambulatory Visit: Payer: Self-pay

## 2021-10-02 LAB — GLUCOSE, CAPILLARY
Glucose-Capillary: 145 mg/dL — ABNORMAL HIGH (ref 70–99)
Glucose-Capillary: 111 mg/dL — ABNORMAL HIGH (ref 70–99)
Glucose-Capillary: 199 mg/dL — ABNORMAL HIGH (ref 70–99)
Glucose-Capillary: 151 mg/dL — ABNORMAL HIGH (ref 70–99)

## 2021-10-02 MED ORDER — DIPHENHYDRAMINE HCL 12.5 MG/5ML PO ELIX
12.5000 mg | ORAL_SOLUTION | Freq: Four times a day (QID) | ORAL | Status: DC | PRN
  Administered 2021-10-02 – 2021-10-04 (×8): 12.5 mg via ORAL
  Filled 2021-10-02 (×8): qty 10

## 2021-10-02 NOTE — Progress Notes (Signed)
[facility] Individual Statement of Services  Patient Name:  Cody Pena  Date:  10/02/2021  Welcome to the [facility].  Our goal is to provide you with an individualized program based on your diagnosis and situation, designed to meet your specific needs.  With this comprehensive rehabilitation program, you will be expected to participate in at least 3 hours of rehabilitation therapies Monday-Friday, with modified therapy programming on the weekends.  Your rehabilitation program will include the following services:  Physical Therapy (PT), Occupational Therapy (OT), 24 hour per day rehabilitation nursing, Therapeutic Recreaction (TR), Neuropsychology, Care Coordinator, Rehabilitation Medicine, Nutrition Services, and Pharmacy Services  Weekly team conferences will be held on Tuesday to discuss your progress.  Your Inpatient Rehabilitation Care Coordinator will talk with you frequently to get your input and to update you on team discussions.  Team conferences with you and your family in attendance may also be held.  Expected length of stay: 7-10 days  Overall anticipated outcome: Supervision with cues  Depending on your progress and recovery, your program may change. Your Inpatient Rehabilitation Care Coordinator will coordinate services and will keep you informed of any changes. Your Inpatient Rehabilitation Care Coordinator's name and contact numbers are listed  below.  The following services may also be recommended but are not provided by the [facility] will be made to provide these services after discharge if needed.  Arrangements include referral to agencies that provide these services.  Your insurance has been verified to be:  [facility] Your primary doctor is:  Jeneen Rinks Little  Pertinent information  will be shared with your doctor and your insurance company.  Inpatient Rehabilitation Care Coordinator:  Ovidio Kin, Ackworth or Emilia Beck  Information discussed with and copy given to patient by: Elease Hashimoto, 10/02/2021, 10:34 AM

## 2021-10-02 NOTE — Progress Notes (Signed)
PROGRESS NOTE   Subjective/Complaints:  Pt reports R BKA itching really bad- still has wound VAC-so cannot scratch it, but wants to  LBM last night Pain better if takes meds q4 hours when ordered prn.  But more pain in back than R BKA- has been on Percocet 10/325 for "years"- and "doesn't work as well as used to".   Asking for something for itching.   ROS- Pt denies SOB, abd pain, CP, N/V/C/D, and vision changes   Objective:   No results found. No results for input(s): WBC, HGB, HCT, PLT in the last 72 hours.  Recent Labs    09/30/21 1833  NA 130*  K 4.6  CL 95*  CO2 26  GLUCOSE 249*  BUN 15  CREATININE 0.95  CALCIUM 7.9*    Intake/Output Summary (Last 24 hours) at 10/02/2021 1358 Last data filed at 10/02/2021 0944 Gross per 24 hour  Intake 480 ml  Output 2085 ml  Net -1605 ml        Physical Exam: Vital Signs Blood pressure 128/76, pulse 73, temperature 97.7 F (36.5 C), temperature source Oral, resp. rate 15, height 6\' 4"  (1.93 m), weight 104.7 kg, SpO2 95 %.   General: awake, alert, appropriate, sitting EOB; c/o itching- wife in room; NAD HENT: conjugate gaze; oropharynx moist CV: regular rate; no JVD Pulmonary: CTA B/L; no W/R/R- good air movement GI: soft, NT, ND, (+)BS Psychiatric: appropriate- interactive Neurological: Ox3 MS: R BKA with wound VAC and shrinker on it-  Skin: No evidence of breakdown, no evidence of rash Neurologic: Cranial nerves II through XII intact, motor strength is 5/5 in bilateral deltoid, bicep, tricep, grip, Left hip flexor, knee extensors, ankle dorsiflexor and plantar flexor RLE NT due to BKA and wound vac   Musculoskeletal: reduced Left ankle DF /PF ROM  No joint swelling   Assessment/Plan: 1. Functional deficits which require 3+ hours per day of interdisciplinary therapy in a comprehensive inpatient rehab setting. Physiatrist is providing close team supervision  and 24 hour management of active medical problems listed below. Physiatrist and rehab team continue to assess barriers to discharge/monitor patient progress toward functional and medical goals  Care Tool:  Bathing    Body parts bathed by patient: Right arm, Left arm, Chest, Abdomen, Front perineal area, Buttocks, Right upper leg, Left upper leg, Face, Left lower leg     Body parts n/a: Right lower leg   Bathing assist Assist Level: Minimal Assistance - Patient > 75%     Upper Body Dressing/Undressing Upper body dressing   What is the patient wearing?: Pull over shirt    Upper body assist Assist Level: Set up assist    Lower Body Dressing/Undressing Lower body dressing      What is the patient wearing?: Pants, Underwear/pull up     Lower body assist Assist for lower body dressing: Minimal Assistance - Patient > 75%     Toileting Toileting    Toileting assist Assist for toileting: Minimal Assistance - Patient > 75%     Transfers Chair/bed transfer  Transfers assist     Chair/bed transfer assist level: Moderate Assistance - Patient 50 - 74%  Locomotion Ambulation   Ambulation assist      Assist level: Minimal Assistance - Patient > 75% Assistive device: Walker-rolling Max distance: 50'   Walk 10 feet activity   Assist     Assist level: Minimal Assistance - Patient > 75% Assistive device: Walker-rolling   Walk 50 feet activity   Assist    Assist level: Minimal Assistance - Patient > 75% Assistive device: Walker-rolling    Walk 150 feet activity   Assist Walk 150 feet activity did not occur: Safety/medical concerns         Walk 10 feet on uneven surface  activity   Assist Walk 10 feet on uneven surfaces activity did not occur: Safety/medical concerns         Wheelchair     Assist Is the patient using a wheelchair?: Yes Type of Wheelchair: Manual    Wheelchair assist level: Supervision/Verbal cueing Max wheelchair  distance: 150'    Wheelchair 50 feet with 2 turns activity    Assist        Assist Level: Supervision/Verbal cueing   Wheelchair 150 feet activity     Assist      Assist Level: Supervision/Verbal cueing   Blood pressure 128/76, pulse 73, temperature 97.7 F (36.5 C), temperature source Oral, resp. rate 15, height 6\' 4"  (1.93 m), weight 104.7 kg, SpO2 95 %.  Medical Problem List and Plan: 1. Functional deficits secondary to right BKA             -patient may not shower until wound VAC is discontinued             -ELOS/Goals: 5-7  Con't PT and OT_ CIR- team conference tomorrow to determine length of stay 2.  Antithrombotics: -DVT/anticoagulation:  Pharmaceutical: Lovenox             -antiplatelet therapy: Aspirin 81 mg daily Plavix 75 mg daily for right SFA and right popliteal stents placed in December 2022 3. Pain Management: Oxycodone 10-15 mg every 4 hours.  Robaxin 500 mg p.o. every 6 hours as needed gabapentin 600 mg 3 times daily  2/20- usually on percocet 10 /325 mg q4 hours or so at home- pain OK 4. Mood: Celexa 20 mg/day, Klonopin 0.5 mg takes twice daily at home             -antipsychotic agents: Not applicable 5. Neuropsych: This patient is capable of making decisions on his own behalf. 6. Skin/Wound Care: Has wound VAC for right lower extremity, monitor for signs of skin breakdown left foot and buttocks region, use of sulfate 220 mg daily for 14 days 7. Fluids/Electrolytes/Nutrition: Carb modified diet monitor I's and O's, Ensure twice daily 8.  Constipation related to opioids use MiraLAX as needed 9.  GERD Protonix 40 mg/day  10.  Diabetes metformin 500 mg twice daily,  sliding scale moderate, Semglee 20 units subcu nightly (may need to restart Novalog mealtime 4U  TID)  CBG (last 3)  Recent Labs    10/01/21 2124 10/02/21 0612 10/02/21 1148  GLUCAP 216* 145* 111*   2/20- CBGs adequate with 1 episode of 216- last night- con't regimen for now 11.  Mild  hypoalbuminemia- cont prosource and ensure supplements 12.  Mild hyponatremia , may be IV related asymptomatic will monitor   2/20- Na 130- stable 13.  ABLA - has had 2 surgeries in last month hgb prior to TMA was 14 but fluctuating in 10-11 gm range since that time  14. Itching  2/20-  started Benadryl 12.5 mg q6 hours prn for itching.   I spent a total of 36   minutes on total care today- >50% coordination of care- due to d/w pt and nursing about VAC and education on when vac comes off and itching   LOS: 2 days A FACE TO FACE EVALUATION WAS PERFORMED  Euva Rundell 10/02/2021, 1:58 PM

## 2021-10-02 NOTE — Progress Notes (Signed)
Physical Therapy Session Note  Patient Details  Name: Cody Pena MRN: [id number] Date of Birth: 07/04/1961  Today's Date: 10/02/2021 PT Individual Time: 5465-6812 PT Individual Time Calculation (min): 41 min   Short Term Goals: Week 1:  PT Short Term Goal 1 (Week 1): =LTG due to ELOS  Skilled Therapeutic Interventions/Progress Updates:     Pt received seated at edge of bed and agrees to therapy. No complaint of pain. PT assists with donning R limb protector. Pt performs squat pivot transfer to [facility] with verbal cues for positioning and sequencing. Pt self propels WC x150' to gym with bilateral upper extremities and cues for improved propulsion efficiency. Pt completes car transfer with CGA and cues to increase eccentric control of stand to sit for safety and strengthening. Pt educated on transferring to non-amputated side whenever possible for safety. Pt performs sit to stand to RW with CGA and cues for hand placement. Pt ambulates x70' and x55' with RW and CGA for majority of distanc and x2 instances of minA due to slight LOBs when pt perform longer step than intended. PT cues to utilize L plantarflexion to assist with ambulation and to decrease workload through bilateral upper extremities. Cues also for upright gaze to improve posture and balance. WC transport back to room. Pt left seated in WC with alarm intact and all needs within reach.  Therapy Documentation Precautions:  Precautions Precautions: Fall Precaution Comments: wound vac on RLE Required Braces or Orthoses: Other Brace Other Brace: RLE limb protector Restrictions Weight Bearing Restrictions: Yes RLE Weight Bearing: Non weight bearing LLE Weight Bearing: Weight bearing as tolerated    Therapy/Group: Individual Therapy  Beau Fanny, PT, DPT 10/02/2021, 10:33 AM

## 2021-10-02 NOTE — Progress Notes (Signed)
Orthopedic Tech Progress Note Patient Details:  Cody Pena February 10, 1961 [id number]  Called in order to [employer] for a BLACK STUMP SOCK  Patient ID: Cody Pena, male   DOB: [birth year], 61 y.o.   MRN: [id number]  [name] 10/02/2021, 11:32 AM

## 2021-10-02 NOTE — Progress Notes (Signed)
Occupational Therapy Session Note  Patient Details  Name: Cody Pena MRN: [id number] Date of Birth: 1960/10/10  Today's Date: 10/02/2021 OT Individual Time: 9024-0973 OT Individual Time Calculation (min): 28 min    Short Term Goals: Week 1:  OT Short Term Goal 1 (Week 1): STGs=LTGs due to ELOS  Skilled Therapeutic Interventions/Progress Updates:  Patient met seated in wc in agreement with OT treatment session. Mild pain reported at rest and with activity in R residual limb. Patient able to self-propel wc to ortho gym in reasonable amount of time. Stand-pivot transfer wc <> mat table with Min guard. Cues for safety with RW. BUE HEP with 5lb weighted dowel for 2 sets x10 reps each. Please refer below for additional details. Back in wc, patient completed 2 sets x10 reps of wc push-ups. Total A for wc transport back to hospital room for time management. Floor wet from room cleaning but patient in agreement with remaining in wc until floor dried prior to return to supine with assist from NT. Session concluded with patient seated in wc with call bell within reach, chair alarm activated and all needs met.   Therapy Documentation Precautions:  Precautions Precautions: Fall Precaution Comments: wound vac on RLE Required Braces or Orthoses: Other Brace Other Brace: RLE limb protector Restrictions Weight Bearing Restrictions: Yes RLE Weight Bearing: Non weight bearing LLE Weight Bearing: Weight bearing as tolerated General:   Therapy/Group: Individual Therapy  Kiandra Sanguinetti R [place] 10/02/2021, 12:06 PM

## 2021-10-02 NOTE — Progress Notes (Signed)
Physical Therapy Session Note  Patient Details  Name: Cody Pena MRN: [id number] Date of Birth: [birth year]  Today's Date: 10/02/2021 PT Individual Time: 1345-1500 PT Individual Time Calculation (min): 75 min   Short Term Goals: Week 1:  PT Short Term Goal 1 (Week 1): =LTG due to ELOS  Skilled Therapeutic Interventions/Progress Updates:    Pt received sidelying in bed asleep, arousable and agreeable to PT session. No complaints of pain this PM. Pt able to don L tennis shoe with setup A. Bed mobility Supervision. Sit to stand and stand pivot transfer with RW and min A. Pt exhibits improved safety awareness and decreased impulsivity with transfers this session. Manual w/c propulsion to/from therapy gym at Supervision level with use of BUE. Pt requires min A for management of w/c parts. Standing RLE therex with RW and CGA to min A for standing balance: hip flex, hip abd, hip ext 2 x 10 reps each. Static standing balance with RW reaching for targets and placing on basketball net with alt UE and min A for balance with one UE support on RW. Pt has one LOB to the L when reaching with RUE, requires assist to slowly lower himself to safely sit on therapy mat. Pt reports urge to toilet at end of session. Ambulatory transfer bed to w/c with RW and min A for balance. Pt is able to perform 3/3 toileting steps with RW and Supervision. Pt left seated in bed with needs in reach, bed alarm in place at end of session.  Therapy Documentation Precautions:  Precautions Precautions: Fall Precaution Comments: wound vac on RLE Required Braces or Orthoses: Other Brace Other Brace: RLE limb protector Restrictions Weight Bearing Restrictions: Yes RLE Weight Bearing: Non weight bearing LLE Weight Bearing: Weight bearing as tolerated       Therapy/Group: Individual Therapy   Peter Congo, PT, DPT, CSRS  10/02/2021, 4:58 PM

## 2021-10-02 NOTE — Progress Notes (Signed)
Inpatient Rehabilitation Care Coordinator Assessment and Plan Patient Details  Name: Cody Pena MRN: [id number] Date of Birth: Jan 10, 1961  Today's Date: 10/02/2021  Hospital Problems: Principal Problem:   Below-knee amputation of right lower extremity (HCC) Active Problems:   S/P BKA (below knee amputation), right [facility])  Past Medical History:  Past Medical History:  Diagnosis Date   COVID    has had Covid 3 times, not sick with it   Depression    Diabetes mellitus without complication (HCC)    pt states he has now been told that he is a type 1 diabetic.   Hypertension    Peripheral vascular disease (HCC)    Past Surgical History:  Past Surgical History:  Procedure Laterality Date   ABDOMINAL AORTOGRAM W/LOWER EXTREMITY N/A 08/11/2021   Procedure: ABDOMINAL AORTOGRAM W/LOWER EXTREMITY;  Surgeon: Leonie Douglas, MD;  Location: [facility] INVASIVE CV LAB;  Service: Cardiovascular;  Laterality: N/A;   AMPUTATION Left 11/15/2017   Procedure: LEFT FOOT 2ND RAY AMPUTATION;  Surgeon: Nadara Mustard, MD;  Location: [facility] OR;  Service: Orthopedics;  Laterality: Left;   AMPUTATION Right 08/25/2021   Procedure: RIGHT TRANSMETATARSAL AMPUTATION;  Surgeon: Nadara Mustard, MD;  Location: [facility] OR;  Service: Orthopedics;  Laterality: Right;   AMPUTATION Right 09/27/2021   Procedure: BELOW KNEE AMPUTATION;  Surgeon: Nadara Mustard, MD;  Location: [facility] OR;  Service: Orthopedics;  Laterality: Right;   CERVICAL FUSION     IR ANGIOGRAM EXTREMITY LEFT  11/14/2017   IR ANGIOGRAM SELECTIVE EACH ADDITIONAL VESSEL  11/14/2017   IR ANGIOGRAM SELECTIVE EACH ADDITIONAL VESSEL  11/14/2017   IR TIB-PERO ART PTA MOD SED  11/14/2017   IR TIB-PERO ART UNI PTA EA ADD VESSEL MOD SED  11/14/2017   IR US GUIDE VASC ACCESS LEFT  11/14/2017   PERIPHERAL VASCULAR INTERVENTION Right 08/11/2021   Procedure: PERIPHERAL VASCULAR INTERVENTION;  Surgeon: Leonie Douglas, MD;  Location: [facility] INVASIVE CV LAB;  Service: Cardiovascular;  Laterality:  Right;  rt sfa stent   Social History:  reports that he has been smoking cigarettes. He has a 23.50 pack-year smoking history. He has never used smokeless tobacco. He reports that he does not drink alcohol and does not use drugs.  Family / Support Systems Marital Status: Married Patient Roles: Spouse, Parent, Other (Comment) (employee) Spouse/Significant Other: [name] [phone] Children: Samantha-daughter [phone] Other Supports: Friends and co-workers Anticipated Caregiver: Wife Ability/Limitations of Caregiver: Wife is disabled herself but an provide supervision level of assist-some light min Caregiver Availability: 24/7 Family Dynamics: Close knit family who are willing to do for one another and always have. Wife has been staying here with him to make sure he has what he needs.  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 Wife plans to be here daily and provide support   Abuse/Neglect Abuse/Neglect Assessment Can Be Completed: Yes Physical Abuse: Denies Verbal Abuse: Denies Sexual Abuse: Denies Exploitation of patient/patient's resources: Denies Self-Neglect: Denies  Patient response  to: Social Isolation - How often do you feel lonely or isolated from those around you?: Always  Emotional Status Pt's affect, behavior and adjustment status: Pt is motivated to do well here and recover, he has  never been reliant upon anyone and does not like it or is used to asking for assist. Having his wife here helps pt cope and feel more secure Recent Psychosocial Issues: other health issues-recently quit smoking Psychiatric History: History of depression-takes medications for this and finds them helpful, but may benefit from seeing neuro-psych while here for coping and a peer support may be beneifical also Substance Abuse History: No issues  Patient / Family Perceptions, Expectations & Goals Pt/Family understanding of illness & functional limitations: Pt and wfie can explain his amputation and plan now moving forward. They talk with the MD rounding and their surgeon and feel they have a good understanding of his plan. He has been on rehab for one day so still is adjusting to the new unit and routine Premorbid pt/family roles/activities: Husband, father, employee, friend, etc Anticipated changes in roles/activities/participation: resume Pt/family expectations/goals: Pt states: " I hope to be as independent as I can be before I leave here."  Wife states: " I know he will push himself and get as good as he can be. He is a Music therapist: None Premorbid Home Care/DME Agencies: None Transportation available at discharge: Wife Is the patient able to respond to transportation needs?: Yes In the past 12 months, has lack of transportation kept you from medical appointments or from getting medications?: No In the past 12 months, has lack of transportation kept you from meetings, work, or from getting things needed for daily living?: No Resource referrals recommended: Neuropsychology, Support group (specify)  Discharge Planning Living Arrangements: Spouse/significant other Support Systems: Spouse/significant other Type of Residence: Private residence Civil engineer, contracting: Media planner (specify) Medical laboratory scientific officer) Surveyor, quantity Resources: Employment, Retail buyer Screen Referred: No Living Expenses: Banker Management: Patient, Spouse Does the patient have any problems obtaining your medications?: No Home Management: Wife Patient/Family Preliminary Plans: Return home with wife who is able to provide supervision and some light min assist. She is disabled herself and has her own health issues. Their daughter will come by and assist after work. Aware team conference tomorrow will discuss goals and target discharge date. Care Coordinator Barriers to Discharge: Insurance for SNF coverage, Wound Care, Decreased caregiver support Care Coordinator Anticipated Follow Up Needs: HH/OP, Support Group  Clinical Impression Pleasant gentleman who is motivated to do well here and wants to know when he can return to work. His wife is staying here with him to provide support and see him in therapies. Will ask neuro-psych see for support and his history of depression and discuss further peer support. Will await therapy evaluations and work on discharge needs.  Lucy Chris 10/02/2021, 10:32 AM

## 2021-10-02 NOTE — Progress Notes (Signed)
Orthopedic Tech Progress Note Patient Details:  MUREL SHENBERGER 1960/08/18 [id number]  Patient is requesting a STUMP protector told RN to reach out to MD and get an order and ill call [employer]   Patient ID: THAI BURGUENO, male   DOB: [birth year], 61 y.o.   MRN: [id number]  [name] 10/02/2021, 10:52 AM

## 2021-10-02 NOTE — Progress Notes (Signed)
Inpatient Rehabilitation  Patient information reviewed and entered into eRehab system by Ronita Hargreaves M. Tongela Encinas, M.A., CCC/SLP, PPS Coordinator.  Information including medical coding, functional ability and quality indicators will be reviewed and updated through discharge.    

## 2021-10-02 NOTE — Progress Notes (Signed)
Occupational Therapy Session Note  Patient Details  Name: Cody Pena MRN: [id number] Date of Birth: 1960/10/14  Today's Date: 10/02/2021 OT Individual Time: 4098-2867 OT Individual Time Calculation (min): 24 min  and Today's Date: 10/02/2021 OT Missed Time: 36 Minutes Missed Time Reason: Patient fatigue  Short Term Goals: Week 1:  OT Short Term Goal 1 (Week 1): STGs=LTGs due to ELOS  Skilled Therapeutic Interventions/Progress Updates:    Pt greeted seated EOB with wife and daughter present. Pt reported he had just finished a long PT session and declined to get back out of bed. OT provided education regarding when he can shower, BADL participation at home, residual limb care, desensitization strategies, as well as DME needs. Pt reported he already has a BSC at home. OT educated on use of tub transfer bench for shower transfers and showed patient a picture of tub bench while demonstrating the transfer. OT educated family members on where to purchase one privately as well as looking for them at second hand stores. Patient with a few questions regarding eventual prosthesis. Pt left seated EOB with family present and needs met.   Therapy Documentation Precautions:  Precautions Precautions: Fall Precaution Comments: wound vac on RLE Required Braces or Orthoses: Other Brace Other Brace: RLE limb protector Restrictions Weight Bearing Restrictions: Yes RLE Weight Bearing: Non weight bearing LLE Weight Bearing: Weight bearing as tolerated Pain: Pain Assessment Pain Score: 3  Rest and repositioned   Therapy/Group: Individual Therapy  Valma Cava 10/02/2021, 3:38 PM

## 2021-10-02 NOTE — Progress Notes (Signed)
PMR Admission Coordinator Pre-Admission Assessment   Patient: Cody Pena is an 61 y.o., male MRN: [id number] DOB: Sep 22, 1960 Height: _0  (193 cm) Weight: 100.6 kg   Insurance Information HMO: Yes    PPO:       PCP:       IPA:       80/20:       OTHER: Group [id number] PRIMARY: UHC choice plus      Policy#: [id number]      Subscriber: patient CM Name: Karene Fry      Phone#: [phone]     Fax#: [phone] Pre-Cert#: [id number]      Employer: [employer] I received authorization from [place] at [facility] on 09/29/20 for admission 09/30/21 for 6 days. Updates due 2/24.  Benefits:  Phone #: [phone]     Name: [email or web address] - online Eff. Date: 08/13/21     Deduct: $2500 (met $2500)      Out of Pocket Max: $9100 (met $9100)      Life Max: N/A CIR: 60% coverage, 40% co-insurance      SNF: 60% coverage, 40% co-insurance limited to 60 days/yr Outpatient: 60%     Co-Pay: 40% Home Health: 60%      Co-Pay: 40% DME: 60%     Co-Pay: 40% Providers: in network   SECONDARY:        Policy#:       Phone#:     Development worker, community:        Phone#:     The Actuary for patients in Inpatient Rehabilitation Facilities with attached Privacy Act [place] Records was provided and verbally reviewed with: N/A   Emergency Contact Information Contact Information       Name Relation Home Work Mobile    [place] Spouse     [phone]    [place],[name]     [phone]           Current Medical History  Patient Admitting Diagnosis: R BKA   History of Present Illness: The patient is a 61 year old gentleman  with past medical history of Covid, depression, uncontrolled DM, HTN, PVD, cervical spine fusion, peripheral neuropathy, OA. who presented to the ED 09/27/21  due to worsening of wound from his recent transmetatarsal amputation.  He  reported that he completed his 2-week course of Doxy 2/3, but presented to ED  due to chills,  reported hallucinations, subjective fever. In ED vitals stable and no concern for sepsis. Labs notable for normal WBC at 9.3, Hgb 12.1, lactic acid 1.7. Right foot x-ray with soft tissue gas adjacent to first metatarsal stump suggesting infection but no bony abnormalities. Received a dose of Ancef and Vanc in the ED. Pain control with Morphine in ED. Ortho was consulted in the ED, recommended MRI and admission for further workup and management of wound..  Pt.  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 Sharol Given. 09/27/21 CIR was consulted to assist return to PLOF.    Patient's medical record from [facility] has been reviewed by the rehabilitation admission coordinator and physician.   Past Medical History      Past Medical History:  Diagnosis Date   COVID      has had Covid 3 times, not sick with it   Depression     Diabetes mellitus without complication ([place])      pt states he has now been told that he is a type 1 diabetic.  Hypertension     Peripheral vascular disease ([place])        Has the patient had major surgery during 100 days prior to admission? Yes   Family History   family history is not on file.   Current Medications   Current Facility-Administered Medications:    0.9 %  sodium chloride infusion, , Intravenous, Continuous, Newt Minion, MD, Last Rate: 75 mL/hr at 09/27/21 1404, New Bag at 09/27/21 1404   acetaminophen (TYLENOL) tablet 325-650 mg, 325-650 mg, Oral, Q6H PRN, Newt Minion, MD, 650 mg at 09/30/21 0126   albuterol (PROVENTIL) (2.5 MG/3ML) 0.083% nebulizer solution 3 mL, 3 mL, Inhalation, Q6H PRN, Newt Minion, MD   alum & mag hydroxide-simeth (MAALOX/MYLANTA) 200-200-20 MG/5ML suspension 15-30 mL, 15-30 mL, Oral, Q2H PRN, Newt Minion, MD   ascorbic acid (VITAMIN C) tablet 1,000 mg, 1,000 mg, Oral, Daily, Newt Minion, MD, 1,000 mg at 09/30/21 2423   aspirin EC tablet 81 mg, 81 mg, Oral, Daily, Newt Minion, MD, 81 mg at  09/30/21 0839   atorvastatin (LIPITOR) tablet 80 mg, 80 mg, Oral, Daily, Newt Minion, MD, 80 mg at 09/30/21 5361   bisacodyl (DULCOLAX) EC tablet 5 mg, 5 mg, Oral, Daily PRN, Newt Minion, MD   citalopram (CELEXA) tablet 20 mg, 20 mg, Oral, Daily, Newt Minion, MD, 20 mg at 09/30/21 0840   clonazePAM (KLONOPIN) tablet 0.5 mg, 0.5 mg, Oral, TID, Newt Minion, MD, 0.5 mg at 09/30/21 4431   clopidogrel (PLAVIX) tablet 75 mg, 75 mg, Oral, Daily, Newt Minion, MD, 75 mg at 09/30/21 0840   docusate sodium (COLACE) capsule 100 mg, 100 mg, Oral, Daily, Newt Minion, MD, 100 mg at 09/30/21 0839   gabapentin (NEURONTIN) tablet 600 mg, 600 mg, Oral, TID, Newt Minion, MD, 600 mg at 09/30/21 0839   guaiFENesin-dextromethorphan (ROBITUSSIN DM) 100-10 MG/5ML syrup 15 mL, 15 mL, Oral, Q4H PRN, Newt Minion, MD   hydrALAZINE (APRESOLINE) injection 5 mg, 5 mg, Intravenous, Q20 Min PRN, Newt Minion, MD   HYDROmorphone (DILAUDID) injection 0.5-1 mg, 0.5-1 mg, Intravenous, Q2H PRN, Newt Minion, MD, 1 mg at 09/30/21 0149   insulin aspart (novoLOG) injection 0-15 Units, 0-15 Units, Subcutaneous, TID WC, Newt Minion, MD, 2 Units at 09/30/21 0843   insulin aspart (novoLOG) injection 4 Units, 4 Units, Subcutaneous, TID WC, Newt Minion, MD, 4 Units at 09/30/21 0844   insulin glargine-yfgn (SEMGLEE) injection 20 Units, 20 Units, Subcutaneous, QHS, Newt Minion, MD, 20 Units at 09/29/21 2139   labetalol (NORMODYNE) injection 10 mg, 10 mg, Intravenous, Q10 min PRN, Newt Minion, MD   magnesium sulfate IVPB 2 g 50 mL, 2 g, Intravenous, Daily PRN, Newt Minion, MD   metFORMIN (GLUCOPHAGE) tablet 500 mg, 500 mg, Oral, BID WC, Newt Minion, MD, 500 mg at 09/30/21 0840   methocarbamol (ROBAXIN) tablet 500 mg, 500 mg, Oral, Q6H PRN, Newt Minion, MD, 500 mg at 09/30/21 0126   metoprolol tartrate (LOPRESSOR) injection 2-5 mg, 2-5 mg, Intravenous, Q2H PRN, Newt Minion, MD   multivitamin with  minerals tablet 1 tablet, 1 tablet, Oral, Daily, Newt Minion, MD, 1 tablet at 09/30/21 0840   nicotine (NICODERM CQ - dosed in mg/24 hours) patch 21 mg, 21 mg, Transdermal, Daily, Newt Minion, MD, 21 mg at 09/30/21 0845   nutrition supplement (JUVEN) (JUVEN) powder packet 1 packet, 1 packet, Oral, BID BM,  Newt Minion, MD, 1 packet at 09/30/21 0840   ondansetron [facility]) injection 4 mg, 4 mg, Intravenous, Q6H PRN, Newt Minion, MD   oxyCODONE (Oxy IR/ROXICODONE) immediate release tablet 10-15 mg, 10-15 mg, Oral, Q4H PRN, Newt Minion, MD, 15 mg at 09/30/21 0370   oxyCODONE (Oxy IR/ROXICODONE) immediate release tablet 5-10 mg, 5-10 mg, Oral, Q4H PRN, Newt Minion, MD   pantoprazole (PROTONIX) EC tablet 40 mg, 40 mg, Oral, Daily, Newt Minion, MD, 40 mg at 09/30/21 0839   phenol (CHLORASEPTIC) mouth spray 1 spray, 1 spray, Mouth/Throat, PRN, Newt Minion, MD   polyethylene glycol (MIRALAX / GLYCOLAX) packet 17 g, 17 g, Oral, Daily PRN, Newt Minion, MD   potassium chloride SA (KLOR-CON M) CR tablet 20-40 mEq, 20-40 mEq, Oral, Daily PRN, Newt Minion, MD   protein supplement (ENSURE MAX) liquid, 11 oz, Oral, BID, Newt Minion, MD, 11 oz at 09/29/21 1932   zinc sulfate capsule 220 mg, 220 mg, Oral, Daily, Newt Minion, MD, 220 mg at 09/30/21 4888   Patients Current Diet:  Diet Order                  Diet Carb Modified Fluid consistency: Thin; Room service appropriate? Yes  Diet effective now                         Precautions / Restrictions Precautions Precautions: Fall Precaution Comments: wound vac on RLE Other Brace: RLE limb protector Restrictions Weight Bearing Restrictions: Yes LLE Weight Bearing: Non weight bearing Other Position/Activity Restrictions: RLE NWB    Has the patient had 2 or more falls or a fall with injury in the past year? Yes   Prior Activity Level Community (5-7x/wk): Went out daily, was working Medical laboratory scientific officer at [employer], was driving.    Prior Functional Level Self Care: Did the patient need help bathing, dressing, using the toilet or eating? Independent   Indoor Mobility: Did the patient need assistance with walking from room to room (with or without device)? Independent   Stairs: Did the patient need assistance with internal or external stairs (with or without device)? Independent   Functional Cognition: Did the patient need help planning regular tasks such as shopping or remembering to take medications? Independent   Patient Information Are you of Hispanic, Latino/a,or Spanish origin?: A. No, not of Hispanic, Latino/a, or Spanish origin What is your race?: A. White Do you need or want an interpreter to communicate with a doctor or health care staff?: 0. No   Patient's Response To:  Health Literacy and Transportation Is the patient able to respond to health literacy and transportation needs?: Yes Health Literacy - How often do you need to have someone help you when you read instructions, pamphlets, or other written material from your doctor or pharmacy?: Never In the past 12 months, has lack of transportation kept you from medical appointments or from getting medications?: No In the past 12 months, has lack of transportation kept you from meetings, work, or from getting things needed for daily living?: No   Home Assistive Devices / [place] Devices/Equipment: CBG Meter Home Equipment: Conservation officer, nature (2 wheels), [place] - single point, BSC/3in1   Prior Device Use: Indicate devices/aids used by the patient prior to current illness, exacerbation or injury? None of the above   Current Functional Level Cognition   Overall Cognitive Status: Within Functional Limits for tasks assessed Orientation Level:  Oriented X4 General Comments: Pt follows commands consistently with cues, but demonstrates focused to sustained attention due to lethargy.  Wife reports pt not at baseline - likely due to meds    Extremity  Assessment (includes Sensation/Coordination)   Upper Extremity Assessment: Overall WFL for tasks assessed  Lower Extremity Assessment: Defer to PT evaluation RLE Deficits / Details: new R BK amputation RLE: Unable to fully assess due to pain RLE Coordination: decreased gross motor     ADLs   Overall ADL's : Needs assistance/impaired Eating/Feeding: Independent Grooming: Wash/dry hands, Wash/dry face, Oral care, Brushing hair, Min guard, Standing Upper Body Bathing: Set up, Sitting Lower Body Bathing: Moderate assistance, Sit to/from stand Upper Body Dressing : Set up, Supervision/safety, Sitting Lower Body Dressing: Min guard, Sit to/from stand Toilet Transfer: Min guard, Ambulation, Comfort height toilet, Grab bars, Rolling walker (2 wheels) Toileting- Clothing Manipulation and Hygiene: Min guard, Sit to/from stand Functional mobility during ADLs: Min guard, Rolling walker (2 wheels) General ADL Comments: activity limited by pain     Mobility   Overal bed mobility: Modified Independent     Transfers   Overall transfer level: Needs assistance Equipment used: Rolling walker (2 wheels), Crutches Transfers: Sit to/from Stand, Bed to chair/wheelchair/BSC Sit to Stand: Min assist Bed to/from chair/wheelchair/BSC transfer type:: Step pivot Step pivot transfers: Min assist General transfer comment: pt requires minA to steady during transfers, improved balance with use of RW compared to attempt with crutches     Ambulation / Gait / Stairs / Wheelchair Mobility   Ambulation/Gait Ambulation/Gait assistance: Counsellor (Feet): 25 Feet (25' x 2) Assistive device: Rolling walker (2 wheels) Gait Pattern/deviations:  (hop-to gait) General Gait Details: pt with slowed hop-to gait, PT with verbal cues for seuquencing turns Gait velocity: reduced Gait velocity interpretation: <1.8 ft/sec, indicate of risk for recurrent falls     Posture / Balance Dynamic Sitting Balance Sitting  balance - Comments: pt able to maintain static sitting while awake Balance Overall balance assessment: Needs assistance Sitting-balance support: No upper extremity supported, Feet supported Sitting balance-[name]: Good Sitting balance - Comments: pt able to maintain static sitting while awake Standing balance support: Reliant on assistive device for balance, Single extremity supported, During functional activity Standing balance-[name]: Poor     Special needs/care consideration Continuous Drip IV  Yes, KVO, Wound Vac Yes to right BKA stump, Skin R BKA post op incision with VAC and dressings in place, Diabetic management Yes h/o DM on oral medications and on insulin in acute care setting., and Special service needs none    Previous Home Environment (from acute therapy documentation) Living Arrangements: Spouse/significant other Available Help at Discharge: Family, Available 24 hours/day Type of Home: House Home Layout: One level Home Access: Stairs to enter Entrance Stairs-Rails: None Entrance Stairs-Number of Steps: 4 (Pt reports a ramp is being constructed) Bathroom Shower/Tub: Tub/shower unit, Multimedia programmer: Standard Home Care Services: No Additional Comments: heel walking on RLE before his surgery   Discharge Living Setting Plans for Discharge Living Setting: Patient's home, House, Lives with (comment) (Lives with wife.) Type of Home at Discharge: House Discharge Home Layout: One level Discharge Home Access: Stairs to enter Entrance Stairs-Rails: Right, Left, Can reach both Entrance Stairs-Number of Steps: 3 steps Discharge Bathroom Shower/Tub: Tub/shower unit, Curtain Discharge Bathroom Toilet: Standard Discharge Bathroom Accessibility: Yes How Accessible: Accessible via walker, Accessible via wheelchair Does the patient have any problems obtaining your medications?: No   Social/Family/Support Systems Patient  Roles: Spouse, Parent (Has a wife and  four daughters.) Contact Information: [place] - wife - [phone] Anticipated Caregiver: wife Ability/Limitations of Caregiver: Wife does not work outside the home; can provide supervision to light assistance. Caregiver Availability: 24/7 Discharge Plan Discussed with Primary Caregiver: Yes Is Caregiver In Agreement with Plan?: Yes Does Caregiver/Family have Issues with Lodging/Transportation while Pt is in Rehab?: No   Goals Patient/Family Goal for Rehab: PT/OT supervision goals Expected length of stay: 5-7 days Pt/Family Agrees to Admission and willing to participate: Yes Program Orientation Provided & Reviewed with Pt/Caregiver Including Roles  & Responsibilities: Yes   Decrease burden of Care through IP rehab admission: N/A   Possible need for SNF placement upon discharge: not anticipated   Patient Condition: I have reviewed medical records from [facility], spoken with CM, and patient. I met with patient at the bedside for inpatient rehabilitation assessment.  Patient will benefit from ongoing PT and OT, can actively participate in 3 hours of therapy a day 5 days of the week, and can make measurable gains during the admission.  Patient will also benefit from the coordinated team approach during an Inpatient Acute Rehabilitation admission.  The patient will receive intensive therapy as well as Rehabilitation physician, nursing, social worker, and care management interventions.  Due to bladder management, bowel management, safety, skin/wound care, disease management, medication administration, pain management, and patient education the patient requires 24 hour a day rehabilitation nursing.  The patient is currently min A with mobility and basic ADLs.  Discharge setting and therapy post discharge at home with home health is anticipated.  Patient has agreed to participate in the Acute Inpatient Rehabilitation Program and will admit today.   Preadmission Screen Completed By:  Genella Mech, 09/30/2021 10:18 AM ______________________________________________________________________   Discussed status with Dr. Letta Pate  on 09/30/21 at 1000 and received approval for admission today.   Admission Coordinator:  Genella Mech, CCC-SLP, time 09/30/21/Date 1022    Assessment/Plan: Diagnosis: Right below-knee amputation for infection Does the need for close, 24 hr/day Medical supervision in concert with the patient's rehab needs make it unreasonable for this patient to be served in a less intensive setting? Yes Co-Morbidities requiring supervision/potential complications: Poorly controlled diabetes, hypertension, peripheral vascular disease, osteoarthritis, peripheral neuropathy associated with diabetes Due to bladder management, bowel management, safety, skin/wound care, disease management, medication administration, pain management, and patient education, does the patient require 24 hr/day rehab nursing? Yes Does the patient require coordinated care of a physician, rehab nurse, PT, OT, and SLP to address physical and functional deficits in the context of the above medical diagnosis(es)? Yes Addressing deficits in the following areas: balance, endurance, locomotion, strength, transferring, bowel/bladder control, bathing, dressing, toileting, cognition, and psychosocial support Can the patient actively participate in an intensive therapy program of at least 3 hrs of therapy 5 days a week? Yes The potential for patient to make measurable gains while on inpatient rehab is good Anticipated functional outcomes upon discharge from inpatient rehab: modified independent and supervision PT, modified independent and supervision OT, n/a SLP Estimated rehab length of stay to reach the above functional goals is: 7 days Anticipated discharge destination: Home 10. Overall Rehab/Functional Prognosis: good     MD Signature: Charlett Blake M.D. [place] [facility] Fellow Am Acad of Phys  Med and Rehab Diplomate Am Board of Electrodiagnostic Med Fellow Am Board of Interventional Pain

## 2021-10-02 NOTE — Discharge Instructions (Addendum)
Inpatient Rehab Discharge Instructions  Cody Pena Discharge date and time: No discharge date for patient encounter.   Activities/Precautions/ Functional Status: Activity: activity as tolerated Diet: diabetic diet Wound Care: Routine skin checks/clean stump site antibacterial soap/Dial and water daily Functional status:  ___ No restrictions     ___ Walk up steps independently ___ 24/7 supervision/assistance   ___ Walk up steps with assistance ___ Intermittent supervision/assistance  ___ Bathe/dress independently ___ Walk with walker     _x__ Bathe/dress with assistance ___ Walk Independently    ___ Shower independently ___ Walk with assistance    ___ Shower with assistance ___ No alcohol     ___ Return to work/school ________  Special Instructions: No driving smoking or alcohol    COMMUNITY REFERRALS UPON DISCHARGE:    Home Exercise Program until he can get his prothesis-save his visits  Medical Equipment/Items Ordered:wheelchair and drop-arm bedside commode                                                 Agency/Supplier:[employer]  [phone]  GENERAL COMMUNITY RESOURCES FOR PATIENT/FAMILY: Support Group: Amputee Support group  Meets virtually on the second Thursday of each month from 7;00-8:30 pm Contact: Vladimir Faster if interested [phone] ext [phone]  My questions have been answered and I understand these instructions. I will adhere to these goals and the provided educational materials after my discharge from the hospital.  Patient/Caregiver Signature _______________________________ Date __________  Clinician Signature _______________________________________ Date __________  Please bring this form and your medication list with you to all your follow-up doctor's appointments.

## 2021-10-03 ENCOUNTER — Encounter (HOSPITAL_COMMUNITY): Payer: 59

## 2021-10-03 ENCOUNTER — Encounter: Payer: 59 | Admitting: Vascular Surgery

## 2021-10-03 LAB — GLUCOSE, CAPILLARY
Glucose-Capillary: 144 mg/dL — ABNORMAL HIGH (ref 70–99)
Glucose-Capillary: 113 mg/dL — ABNORMAL HIGH (ref 70–99)
Glucose-Capillary: 195 mg/dL — ABNORMAL HIGH (ref 70–99)
Glucose-Capillary: 193 mg/dL — ABNORMAL HIGH (ref 70–99)

## 2021-10-03 MED ORDER — ASCORBIC ACID 500 MG PO TABS
500.0000 mg | ORAL_TABLET | Freq: Two times a day (BID) | ORAL | Status: DC
  Administered 2021-10-03 – 2021-10-06 (×6): 500 mg via ORAL
  Filled 2021-10-03 (×6): qty 1

## 2021-10-03 MED ORDER — INSULIN GLARGINE-YFGN 100 UNIT/ML ~~LOC~~ SOLN
22.0000 [IU] | Freq: Every day | SUBCUTANEOUS | Status: DC
  Administered 2021-10-03 – 2021-10-05 (×3): 22 [IU] via SUBCUTANEOUS
  Filled 2021-10-03 (×4): qty 0.22

## 2021-10-03 NOTE — IPOC Note (Signed)
Overall Plan of Care [facility]) Patient Details Name: Cody Pena MRN: [id number] DOB: 1960-12-12  Admitting Diagnosis: Below-knee amputation of right lower extremity [facility])  Hospital Problems: Principal Problem:   Below-knee amputation of right lower extremity (HCC) Active Problems:   S/P BKA (below knee amputation), right (HCC)     Functional Problem List: Nursing Edema, Endurance, Pain, Skin Integrity, Sensory, Safety  PT Balance, Endurance, Safety, Sensory, Skin Integrity  OT Balance, Sensory, Skin Integrity, Motor, Pain, Safety  SLP    TR         Basic ADLs: OT Grooming, Bathing, Dressing, Toileting     Advanced  ADLs: OT Simple Meal Preparation     Transfers: PT Bed Mobility, Bed to Chair, Car, Furniture, Civil Service fast streamer, Research scientist (life sciences): PT Ambulation, Psychologist, prison and probation services, Stairs     Additional Impairments: OT None  SLP        TR      Anticipated Outcomes Item Anticipated Outcome  Self Feeding no goal  Swallowing      Basic self-care  supervision  Toileting  supervision   Bathroom Transfers supervision  Bowel/Bladder  n/a  Transfers  Supervision  Locomotion  Supervision for short distance ambulation otherwise at w/c level  Communication     Cognition     Pain  < 3  Safety/Judgment  supervision   Therapy Plan: PT Intensity: Minimum of 1-2 x/day ,45 to 90 minutes PT Frequency: 5 out of 7 days PT Duration Estimated Length of Stay: 7-10 days OT Intensity: Minimum of 1-2 x/day, 45 to 90 minutes OT Frequency: 5 out of 7 days OT Duration/Estimated Length of Stay: 7-10 days     Due to the current state of emergency, patients may not be receiving their 3-hours of Medicare-mandated therapy.   Team Interventions: Nursing Interventions Patient/Family Education, Disease Management/Prevention, Pain Management, Skin Care/Wound Management, Discharge Planning, Psychosocial Support  PT interventions Ambulation/gait training,  Warden/ranger, Community reintegration, Discharge planning, Disease management/prevention, DME/adaptive equipment instruction, Functional mobility training, Pain management, Patient/family education, Psychosocial support, Skin care/wound management, Splinting/orthotics, Stair training, Therapeutic Activities, UE/LE Strength taining/ROM, Therapeutic Exercise, UE/LE Coordination activities, Wheelchair propulsion/positioning  OT Interventions Warden/ranger, Disease mangement/prevention, Self Care/advanced ADL retraining, Therapeutic Exercise, Wheelchair propulsion/positioning, UE/LE Strength taining/ROM, Skin care/wound managment, Pain management, DME/adaptive equipment instruction, Community reintegration, Equities trader education, UE/LE Coordination activities, Therapeutic Activities, Psychosocial support, Functional mobility training, Discharge planning  SLP Interventions    TR Interventions    SW/CM Interventions Discharge Planning, Psychosocial Support, Patient/Family Education   Barriers to Discharge MD  Medical stability, Home enviroment access/loayout, Wound care, Weight, Weight bearing restrictions, Medication compliance, and Behavior  Nursing Decreased caregiver support, Home environment access/layout, Wound Care, Lack of/limited family support, Weight, Weight bearing restrictions 1 level, 3 steps, 2 rails. Spouse can provide supervision to light assist.  PT Weight bearing restrictions    OT Wound Care    SLP      SW Insurance for SNF coverage, Wound Care, Decreased caregiver support     Team Discharge Planning: Destination: PT-Home ,OT- Home , SLP-  Projected Follow-up: PT-Home health PT, OT-  24 hour supervision/assistance, SLP-  Projected Equipment Needs: PT-Rolling walker with 5" wheels, Wheelchair (measurements), Wheelchair cushion (measurements) (18x18 w/c with R residual limb support; pt already owns RW and [facility]), OT- To be determined, SLP-  Equipment  Details: PT- , OT-  Patient/family involved in discharge planning: PT- Patient,  OT-Patient, SLP-   MD ELOS: 7-10 days Medical Rehab Prognosis:  Good Assessment: Pt is a 61 yr old male with  R BKA, DM; hyponatremia; ABLA; and itching-  Goals supervision by d/c    See Team Conference Notes for weekly updates to the plan of care

## 2021-10-03 NOTE — Progress Notes (Signed)
Occupational Therapy Session Note  Patient Details  Name: Cody Pena MRN: [id number] Date of Birth: [birth year]  Today's Date: 10/03/2021 OT Individual Time: 1430-1530 OT Individual Time Calculation (min): 60 min    Short Term Goals: Week 1:  OT Short Term Goal 1 (Week 1): STGs=LTGs due to ELOS  Skilled Therapeutic Interventions/Progress Updates:    Pt received in wc and ready for therapy.  Pt self propelled wc to gym and then used RW with CGA to take several steps to mat.   On mat pt worked on UE strengthening with pushups on yoga blocks for triceps, red theraband rows for upper back.  Standing balance with alternating hands to reach knees.   Discussed safe mechanics with standing and clothing management.    Stand to partial squats with cues to push hips backwards to avoid stress on L knee.   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  Encouraged him to be open with prosthetist on what activity level he hopes to achieve in the future.  Pt has good endurance but did need a few short rest breaks.  Pt transferred back to wc and then self propelled back to room.  Used RW to transfer to bed with CGA to S.  Pt resting EOB with alarm set and all needs met.  Therapy Documentation Precautions:  Precautions Precautions: Fall Precaution Comments: wound vac on RLE Required Braces or Orthoses: Other Brace Other Brace: RLE limb protector Restrictions Weight Bearing Restrictions: Yes RLE Weight Bearing: Non weight bearing LLE Weight Bearing: Weight bearing as tolerated    Vital Signs: Therapy Vitals Temp: 98.4 F (36.9 C) Pulse Rate: 61 Resp: 20 BP: 103/61 Patient Position (if appropriate): Lying Oxygen Therapy SpO2: 96 % O2 Device: Room Air Pain: Pain Assessment Pain Scale: 0-10 Pain Score: 2  Pain Type: Chronic pain Pain Location: Back Pain Orientation: Mid Pain Descriptors / Indicators: Aching Pain  Frequency: Constant Pain Onset: On-going Pain Intervention(s): Medication (See eMAR) ADL: ADL Grooming: Setup Where Assessed-Grooming: Edge of bed Upper Body Bathing: Setup Where Assessed-Upper Body Bathing: Edge of bed Lower Body Bathing: Minimal assistance Where Assessed-Lower Body Bathing: Edge of bed Upper Body Dressing: Setup Where Assessed-Upper Body Dressing: Edge of bed Lower Body Dressing: Minimal assistance Toileting: Minimal assistance Where Assessed-Toileting: Glass blower/designer: Minimal Print production planner Method: Ambulating (RW) Science writer: Energy manager: Not assessed     Therapy/Group: Individual Therapy  Sheril Hammond 10/03/2021, 4:16 PM

## 2021-10-03 NOTE — Progress Notes (Signed)
Physical Therapy Session Note  Patient Details  Name: Cody Pena MRN: [id number] Date of Birth: 09-19-1960  Today's Date: 10/03/2021 PT Individual Time: 0850-1004 PT Individual Time Calculation (min): 74 min   Short Term Goals: Week 1:  PT Short Term Goal 1 (Week 1): =LTG due to ELOS  Skilled Therapeutic Interventions/Progress Updates:    Pt seated EOB on arrival with his wife present, No complaint of pain. Sit to stand to RW and Stand pivot transfer from various surfaces with CGA throughout. Pt propelled w/c with BUE to/from gym with supervision, including managing w/c parts. Significant time spent on amputee education, timeline for prosthetic, exercise performance, and clinical reasoning, plan of care.   Pt performed supine<>sit, supine<>prone on mat table with supervision, and Pt performed the following exercises to promote LE strength and endurance:  Sit to stand to RW with CGA 3 x 10 Sidelying hip abduction with extensive education and multimodal cueing for technique to activate glute med for future prosthetic use Prone hip extension with similar cueing and education  Therapist then adjusted w/c from hemi height to normal height to accommodate pt's height and improve ease of transfers. Pt propelled w/c back to room and remained in w/c, was left with all needs in reach and alarm active.     Therapy Documentation Precautions:  Precautions Precautions: Fall Precaution Comments: wound vac on RLE Required Braces or Orthoses: Other Brace Other Brace: RLE limb protector Restrictions Weight Bearing Restrictions: Yes RLE Weight Bearing: Non weight bearing LLE Weight Bearing: Weight bearing as tolerated General:     Therapy/Group: Individual Therapy  Juluis Rainier 10/03/2021, 1:09 PM

## 2021-10-03 NOTE — Patient Care Conference (Signed)
Inpatient RehabilitationTeam Conference and Plan of Care Update Date: 10/03/2021   Time: 11:31 AM    Patient Name: Cody Pena      Medical Record Number: [id number]  Date of Birth: 11/21/1960 Sex: Male         Room/Bed: 4M11C/4M11C-01 Payor Info: Payor: Advertising copywriter / Plan: Advertising copywriter OTHER / Product Type: *No Product type* /    Admit Date/Time:  09/30/2021  5:33 PM  Primary Diagnosis:  Below-knee amputation of right lower extremity [facility])  Hospital Problems: Principal Problem:   Below-knee amputation of right lower extremity (HCC) Active Problems:   S/P BKA (below knee amputation), right [facility])    Expected Discharge Date: Expected Discharge Date: 10/06/21  Team Members Present: Physician leading conference: Dr. Genice Rouge Social Worker Present: Dossie Der, LCSW Nurse Present: Kennyth Arnold, RN PT Present: Bernie Covey, PT OT Present: Roney Mans, OT PPS Coordinator present : Fae Pippin, SLP     Current Status/Progress Goal Weekly Team Focus  Bowel/Bladder   Cont B/B, lbm 10/02/21  Remain ont of B/B  Assess toileting needs qshift and PRN   Swallow/Nutrition/ Hydration             ADL's   CGA to Min A for LB bathing/dressing with lateral leans vs. sit to stand with RW. Min A to CGA ADL transfers with RW. Unable to shower 2/2 wound vac.  Supervision A  dynamic standing balance, functional transfers, d/c planning, pt/wife education, amputee education   Mobility             Communication             Safety/Cognition/ Behavioral Observations            Pain   Pt c/o pain level 8/10 in lower back and surgical wound right BKA.  Remain <4  Assess pain qshit and PRN   Skin   Pt has large callus on left foot,left toes starting necrosis, open wound on left great toe and surgical wound rt BKA with wound vac  Remain free from skin impairments  Assess skin qshift and PRN     Discharge Planning:  HOme with wife who is disabled herself but can  provide light assist-styaing here with him. Pt is motivated to do well here   Team Discussion: Wound vac to discontinue tomorrow. Does not keep leg straight, not wearing limb guard. Educated on importance of both. Reported vaping in room. Continent B/B, chronic back pain. Discharging home with spouse.  Patient on target to meet rehab goals: yes, supervision goals, with some min assist goals.  *See Care Plan and progress notes for long and short-term goals.   Revisions to Treatment Plan:  Discontinue wound vac 10/04/2021.   Teaching Needs: Family education, residual limb care, medication/pain management, skin/wound care, transfer training, etc.   Current Barriers to Discharge: Decreased caregiver support, Wound care, Lack of/limited family support, and Weight bearing restrictions  Possible Resolutions to Barriers: Family education Order recommended DME Follow-up PT/OT     Medical Summary Current Status: continent- has WOUND VAC for R BKA until tomorrow- chronic back pain- pain worse than R BKA pain- abrasion L great toe  Barriers to Discharge: Behavior;Decreased family/caregiver support;Home enviroment access/layout;Medical stability;Weight bearing restrictions;Weight;Wound care  Barriers to Discharge Comments: self limiting- home wiht wife- Possible Resolutions to [name]: at min A right now - close to [employer]- goals supervision- d/c Friday- has BSC- and tub bench; 2/24   Continued Need for Acute Rehabilitation Level of  Care: The patient requires daily medical management by a physician with specialized training in physical medicine and rehabilitation for the following reasons: Direction of a multidisciplinary physical rehabilitation program to maximize functional independence : Yes Medical management of patient stability for increased activity during participation in an intensive rehabilitation regime.: Yes Analysis of laboratory values and/or radiology reports with  any subsequent need for medication adjustment and/or medical intervention. : Yes   I attest that I was present, lead the team conference, and concur with the assessment and plan of the team.   Tennis Must 10/03/2021, 3:01 PM

## 2021-10-03 NOTE — Progress Notes (Signed)
PROGRESS NOTE   Subjective/Complaints:  Pt eports leg is still itching intermittently- but benadryl really helpful.  Has new abrasion L great toe- wanted me to check out.  Per wife, attitude is a little better.  Per nursing, vaping in room.    ROS-  Pt denies SOB, abd pain, CP, N/V/C/D, and vision changes    Objective:   No results found. No results for input(s): WBC, HGB, HCT, PLT in the last 72 hours.  Recent Labs    09/30/21 1833  NA 130*  K 4.6  CL 95*  CO2 26  GLUCOSE 249*  BUN 15  CREATININE 0.95  CALCIUM 7.9*    Intake/Output Summary (Last 24 hours) at 10/03/2021 1038 Last data filed at 10/03/2021 0330 Gross per 24 hour  Intake --  Output 600 ml  Net -600 ml        Physical Exam: Vital Signs Blood pressure 108/60, pulse 60, temperature 98.5 F (36.9 C), temperature source Oral, resp. rate 14, height 6\' 4"  (1.93 m), weight 104.7 kg, SpO2 98 %.    General: awake, alert, appropriate, sitting up at EOB; NAD HENT: conjugate gaze; oropharynx moist- large beard CV: regular rate; no JVD Pulmonary: CTA B/L; no W/R/R- good air movement GI: soft, NT, ND, (+)BS Psychiatric: appropriate- but appears to be a little depressed Neurological: Ox3  MS: R BKA with wound VAC and shrinker on it- no change Skin: No evidence of breakdown, no evidence of rash Neurologic: Cranial nerves II through XII intact, motor strength is 5/5 in bilateral deltoid, bicep, tricep, grip, Left hip flexor, knee extensors, ankle dorsiflexor and plantar flexor RLE NT due to BKA and wound vac   Musculoskeletal: reduced Left ankle DF /PF ROM  No joint swelling   Assessment/Plan: 1. Functional deficits which require 3+ hours per day of interdisciplinary therapy in a comprehensive inpatient rehab setting. Physiatrist is providing close team supervision and 24 hour management of active medical problems listed below. Physiatrist and  rehab team continue to assess barriers to discharge/monitor patient progress toward functional and medical goals  Care Tool:  Bathing  Bathing activity did not occur: Safety/medical concerns Body parts bathed by patient: Right arm, Left arm, Chest, Abdomen, Front perineal area, Buttocks, Right upper leg, Left upper leg, Face, Left lower leg     Body parts n/a: Right lower leg   Bathing assist Assist Level: Minimal Assistance - Patient > 75%     Upper Body Dressing/Undressing Upper body dressing   What is the patient wearing?: Pull over shirt    Upper body assist Assist Level: Set up assist    Lower Body Dressing/Undressing Lower body dressing      What is the patient wearing?: Pants, Underwear/pull up     Lower body assist Assist for lower body dressing: Minimal Assistance - Patient > 75%     Toileting Toileting    Toileting assist Assist for toileting: Minimal Assistance - Patient > 75%     Transfers Chair/bed transfer  Transfers assist     Chair/bed transfer assist level: Minimal Assistance - Patient > 75%     Locomotion Ambulation   Ambulation assist  Assist level: Minimal Assistance - Patient > 75% Assistive device: Walker-rolling Max distance: 50'   Walk 10 feet activity   Assist     Assist level: Minimal Assistance - Patient > 75% Assistive device: Walker-rolling   Walk 50 feet activity   Assist    Assist level: Minimal Assistance - Patient > 75% Assistive device: Walker-rolling    Walk 150 feet activity   Assist Walk 150 feet activity did not occur: Safety/medical concerns         Walk 10 feet on uneven surface  activity   Assist Walk 10 feet on uneven surfaces activity did not occur: Safety/medical concerns         Wheelchair     Assist Is the patient using a wheelchair?: Yes Type of Wheelchair: Manual    Wheelchair assist level: Supervision/Verbal cueing Max wheelchair distance: 150'    Wheelchair  50 feet with 2 turns activity    Assist        Assist Level: Supervision/Verbal cueing   Wheelchair 150 feet activity     Assist      Assist Level: Supervision/Verbal cueing   Blood pressure 108/60, pulse 60, temperature 98.5 F (36.9 C), temperature source Oral, resp. rate 14, height 6\' 4"  (1.93 m), weight 104.7 kg, SpO2 98 %.  Medical Problem List and Plan: 1. Functional deficits secondary to right BKA             -patient may not shower until wound VAC is discontinued             -ELOS/Goals: 5-7  Con't CIR- PT and OT- pt educated to keep leg as straight as possible, esp when not wearing limb guard to prevent contracture- which would limit prosthesis use-   Team conference today to determine length of stay 2.  Antithrombotics: -DVT/anticoagulation:  Pharmaceutical: Lovenox             -antiplatelet therapy: Aspirin 81 mg daily Plavix 75 mg daily for right SFA and right popliteal stents placed in December 2022 3. Pain Management: Oxycodone 10-15 mg every 4 hours.  Robaxin 500 mg p.o. every 6 hours as needed gabapentin 600 mg 3 times daily  2/20- usually on percocet 10 /325 mg q4 hours or so at home- pain OK  2/21- pain adequate- stable- con't regimen 4. Mood: Celexa 20 mg/day, Klonopin 0.5 mg takes twice daily at home             -antipsychotic agents: Not applicable 5. Neuropsych: This patient is capable of making decisions on his own behalf. 6. Skin/Wound Care: Has wound VAC for right lower extremity, monitor for signs of skin breakdown left foot and buttocks region, use of sulfate 220 mg daily for 14 days  2/21- wound VAC to be removed tomorrow 7. Fluids/Electrolytes/Nutrition: Carb modified diet monitor I's and O's, Ensure twice daily 8.  Constipation related to opioids use MiraLAX as needed 9.  GERD Protonix 40 mg/day  10.  Diabetes metformin 500 mg twice daily,  sliding scale moderate, Semglee 20 units subcu nightly (may need to restart Novalog mealtime 4U   TID)  CBG (last 3)  Recent Labs    10/02/21 1644 10/02/21 2024 10/03/21 0706  GLUCAP 199* 151* 144*   2/20- CBGs adequate with 1 episode of 216- last night- con't regimen for now  2/21- CBGs controlled except for 1 spike nightly- was 199- will increase Semglee to 22 units and monitor 11.  Mild hypoalbuminemia- cont prosource and ensure supplements 12.  Mild hyponatremia , may be IV related asymptomatic will monitor   2/20- Na 130- stable 13.  ABLA - has had 2 surgeries in last month hgb prior to TMA was 14 but fluctuating in 10-11 gm range since that time  14. Itching  2/20- started Benadryl 12.5 mg q6 hours prn for itching.   2/21- itching is better- con't regimen  I spent a total of 39   minutes on total care today- >50% coordination of care- due to team conference and education on VAC and mood  LOS: 3 days A FACE TO FACE EVALUATION WAS PERFORMED  Cody Pena 10/03/2021, 10:38 AM

## 2021-10-03 NOTE — Progress Notes (Signed)
Pt resting ok through night but still requesting PRN pain medication throughout the shift. Pain level is consistent around 8/10. Pt has large callus on left foot. Left toes small areas of necrosis. Nurse cleansed foot and left dressing off so doctor can assess. Also looks like a small blister/open area on top of great big toe.

## 2021-10-03 NOTE — Progress Notes (Signed)
Occupational Therapy Session Note  Patient Details  Name: CID AGENA MRN: [id number] Date of Birth: [birth year]  Today's Date: 10/03/2021 OT Individual Time: 0998-3382 OT Individual Time Calculation (min): 54 min    Short Term Goals: Week 1:  OT Short Term Goal 1 (Week 1): STGs=LTGs due to ELOS  Skilled Therapeutic Interventions/Progress Updates:   Pt greeted EOB  agreeable to OT intervention. Session focus on BADL reeducation, functional mobility,  and decreasing overall caregiver burden.  Pt completed stand pivot transfers throughout session with CGA with Rw. Pt completed w/c mobility to ADL apt with supervision where pt completed stand pivot transfers to EOB and couch with Rw and CGA. Pt completed squat pivots to straight back chair with arm rests and TTB with CGA. Pt with good safety awareness and judgement during transfers, education provided on residual limb care such as how to clean incision, positioning and importance of wearing the shrinker. Pt c/o residual limb sliding off of leg rests, applied theraband with tape around leg rest to add friction. Pt completed w/c mobility back to room and completed stand pivot with rw with CGA.  pt left seated EOB with NT present.                    Therapy Documentation Precautions:  Precautions Precautions: Fall Precaution Comments: wound vac on RLE Required Braces or Orthoses: Other Brace Other Brace: RLE limb protector Restrictions Weight Bearing Restrictions: Yes RLE Weight Bearing: Non weight bearing LLE Weight Bearing: Weight bearing as tolerated  Pain: no pain reported during session     Therapy/Group: Individual Therapy  Barron Schmid 10/03/2021, 12:13 PM

## 2021-10-03 NOTE — Progress Notes (Signed)
Patient ID: Cody Pena, male   DOB: 08-24-1960, 61 y.o.   MRN: [id number]  Met with pt and wife to update regarding team conference goals of supervision-mod/I wheelchair level and target discharge date of 2/24. Both are pleased with a discharge date something to look forward to. Discussed equipment he is agreeable to ordering it and has no preference. Gave wife disability paperwork and she will follow up with. Have placed pt on neuro-psych list due to issues with coping with loss of his leg.

## 2021-10-03 NOTE — Progress Notes (Addendum)
Initial Nutrition Assessment  DOCUMENTATION CODES:   Not applicable  INTERVENTION:  Provide Ensure Enlive po BID, each supplement provides 350 kcal and 20 grams of protein.  Provide 30 ml Prosource Plus po BID, each supplement provides 100 kcal and 15 grams of protein.   Provide 500 mg Vitamin C BID.  Encourage PO intake.   NUTRITION DIAGNOSIS:   Increased nutrient needs related to wound healing as evidenced by estimated needs.  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 Transtibial amputation 2/15. Functional deficits secondary to right BKA admitted to CIR.  Meal completion has been 100%. Pt reports having a good appetite currently and prior to admission with no difficulties. Pt with usual intake of at least 3 meals a day. Pt currently has Ensure and Prosource plus ordered. RD to continue with current orders to aid in wound healing.   NUTRITION - FOCUSED PHYSICAL EXAM:  Flowsheet Row Most Recent Value  Orbital Region Mild depletion  Upper Arm Region No depletion  Thoracic and Lumbar Region No depletion  Buccal Region Mild depletion  Temple Region Mild depletion  Clavicle Bone Region No depletion  Clavicle and Acromion Bone Region No depletion  Scapular Bone Region Unable to assess  Dorsal Hand No depletion  Patellar Region No depletion  Anterior Thigh Region No depletion  Posterior Calf Region No depletion  Edema (RD Assessment) Moderate  Hair Reviewed  Eyes Reviewed  Mouth Reviewed  Skin Reviewed  Nails Reviewed      Labs and medications reviewed. Zinc sulfate 220 mg daily for 14 doses  ordered. RD to additionally order Vitamin C 500 mg for wound healing.   Diet Order:   Diet Order             Diet Carb Modified Fluid consistency: Thin; Room service appropriate? Yes  Diet effective now                   EDUCATION NEEDS:   Not appropriate for education at this time  Skin:  Skin Assessment: Skin Integrity Issues: Skin Integrity Issues:: Wound VAC Wound Vac: R pretibial Diabetic Ulcer: L heel Incisions: R leg Other: L necrotic great toe  Last BM:  2/18  Height:   Ht Readings from Last 1 Encounters:  10/02/21 6\' 4"  (1.93 m)    Weight:   Wt Readings from Last 1 Encounters:  10/02/21 104.7 kg   BMI:  Body mass index is 28.09 kg/m.  Estimated Nutritional Needs:   Kcal:  2300-2600  Protein:  120-130 grams  Fluid:  >/= 2 L/day  10/04/21, MS, RD, LDN RD pager number/after hours weekend pager number on Amion.

## 2021-10-04 LAB — CBC WITH DIFFERENTIAL/PLATELET
Abs Immature Granulocytes: 0.04 10*3/uL (ref 0.00–0.07)
Basophils Absolute: 0.1 10*3/uL (ref 0.0–0.1)
Basophils Relative: 1 %
Eosinophils Absolute: 0.5 10*3/uL (ref 0.0–0.5)
Eosinophils Relative: 6 %
HCT: 32.2 % — ABNORMAL LOW (ref 39.0–52.0)
Hemoglobin: 10.4 g/dL — ABNORMAL LOW (ref 13.0–17.0)
Immature Granulocytes: 1 %
Lymphocytes Relative: 34 %
Lymphs Abs: 2.8 10*3/uL (ref 0.7–4.0)
MCH: 30.5 pg (ref 26.0–34.0)
MCHC: 32.3 g/dL (ref 30.0–36.0)
MCV: 94.4 fL (ref 80.0–100.0)
Monocytes Absolute: 0.7 10*3/uL (ref 0.1–1.0)
Monocytes Relative: 9 %
Neutro Abs: 4.2 10*3/uL (ref 1.7–7.7)
Neutrophils Relative %: 49 %
Platelets: 385 10*3/uL (ref 150–400)
RBC: 3.41 MIL/uL — ABNORMAL LOW (ref 4.22–5.81)
RDW: 13.2 % (ref 11.5–15.5)
WBC: 8.4 10*3/uL (ref 4.0–10.5)
nRBC: 0 % (ref 0.0–0.2)

## 2021-10-04 LAB — BASIC METABOLIC PANEL
Anion gap: 10 (ref 5–15)
BUN: 15 mg/dL (ref 8–23)
CO2: 30 mmol/L (ref 22–32)
Calcium: 8.7 mg/dL — ABNORMAL LOW (ref 8.9–10.3)
Chloride: 97 mmol/L — ABNORMAL LOW (ref 98–111)
Creatinine, Ser: 0.78 mg/dL (ref 0.61–1.24)
GFR, Estimated: >60 mL/min (ref >60)
Glucose, Bld: 142 mg/dL — ABNORMAL HIGH (ref 70–99)
Potassium: 4.2 mmol/L (ref 3.5–5.1)
Sodium: 137 mmol/L (ref 135–145)

## 2021-10-04 LAB — GLUCOSE, CAPILLARY
Glucose-Capillary: 139 mg/dL — ABNORMAL HIGH (ref 70–99)
Glucose-Capillary: 178 mg/dL — ABNORMAL HIGH (ref 70–99)
Glucose-Capillary: 193 mg/dL — ABNORMAL HIGH (ref 70–99)
Glucose-Capillary: 203 mg/dL — ABNORMAL HIGH (ref 70–99)

## 2021-10-04 NOTE — Progress Notes (Signed)
Occupational Therapy Session Note  Patient Details  Name: FUAD FORGET MRN: [id number] Date of Birth: 1961-01-03  Today's Date: 10/04/2021 OT Individual Time: 7342-8768 OT Individual Time Calculation (min): 35 min    Short Term Goals: Week 1:  OT Short Term Goal 1 (Week 1): STGs=LTGs due to ELOS  Skilled Therapeutic Interventions/Progress Updates:   Pt received sitting EOB with no c/o pain at rest, wife present. Provided education on desensitization principles for reduction of phantom pain/sensation. He donned limb protector with set up EOB. He completed a sit > stand from EOB with (S). CGA for ambulatory transfer into the bathroom with the RW, OT managing wound vac. Cueing for bringing RW over toilet for standing level toileting. Provided edu re home toileting techniques for fall risk reduction. +urine void in standing with CGA overall. Pt stood at the sink with close supervision for hand washing and grooming tasks. He returned to the w/c and was passed off to PT in room.    Therapy Documentation Precautions:  Precautions Precautions: Fall Precaution Comments: wound vac on RLE Required Braces or Orthoses: Other Brace Other Brace: RLE limb protector Restrictions Weight Bearing Restrictions: Yes RLE Weight Bearing: Non weight bearing LLE Weight Bearing: Weight bearing as tolerated  Therapy/Group: Individual Therapy  Crissie Reese 10/04/2021, 6:45 AM

## 2021-10-04 NOTE — Progress Notes (Signed)
Physical Therapy Discharge Summary  Patient Details  Name: Cody Pena MRN: [id number] Date of Birth: [birth year]  Patient has met 6 of 10 long term goals due to improved activity tolerance, improved balance, increased strength, and ability to compensate for deficits.  Patient to discharge at a wheelchair level Supervision.   Patient's care partner is independent to provide the necessary physical assistance at discharge. Pt to d/c home with his wife to their one level home with ramped entry. Pt is CGA with gait/transfers and mod I with w/c mobility.   Reasons goals not met: Pt required CGA for safety during functional transfers (bed<>chair and car) while goals were set to supervision.   Recommendation:  Patient will benefit from ongoing skilled PT services in outpatient setting to continue to advance safe functional mobility, address ongoing impairments in strength, functional mobility, and minimize fall risk.  Equipment: 18x18 w/c  Reasons for discharge: treatment goals met and discharge from hospital  Patient/family agrees with progress made and goals achieved: Yes  PT Discharge Precautions/Restrictions Precautions Precautions: Fall Precaution Comments: R BKA Other Brace: R limb guard Restrictions Weight Bearing Restrictions: Yes RLE Weight Bearing: Non weight bearing Pain Pain Assessment Pain Scale: 0-10 Pain Score: 0-No pain Pain Interference Pain Interference Pain Effect on Sleep: 3. Frequently Pain Interference with Therapy Activities: 1. Rarely or not at all Pain Interference with Day-to-Day Activities: 2. Occasionally Vision/Perception  Vision - History Ability to See in Adequate Light: 0 Adequate Perception Perception: Within Functional Limits Praxis Praxis: Intact  Cognition Overall Cognitive Status: Within Functional Limits for tasks assessed Arousal/Alertness: Awake/alert Awareness: Appears intact Problem Solving: Appears intact Safety/Judgment:  Appears intact Sensation Sensation Light Touch: Impaired by gross assessment Light Touch Impaired Details: Impaired RLE (on the distal end of residual limb) Hot/Cold: Appears Intact Proprioception: Appears Intact Stereognosis: Appears Intact Coordination Gross Motor Movements are Fluid and Coordinated: No Coordination and Movement Description: Affected by limb loss. Significant improvement since date of evaluatoin Finger Nose Finger Test: WNL Motor  Motor Motor: Other (comment) Motor - Discharge Observations: Motor patterns impacted by R BKA - improved since date of evaluation  Mobility Bed Mobility Bed Mobility: Rolling Right;Rolling Left;Supine to Sit;Sit to Supine Rolling Right: Independent Rolling Left: Independent Supine to Sit: Independent Sit to Supine: Independent Transfers Transfers: Sit to Stand;Stand Pivot Transfers;Squat Pivot Transfers Sit to Stand: Supervision/Verbal cueing Stand Pivot Transfers: Contact Guard/Touching assist Stand Pivot Transfer Details: Verbal cues for precautions/safety;Verbal cues for safe use of DME/AE Squat Pivot Transfers: Supervision/Verbal cueing Transfer (Assistive device): Rolling walker Locomotion  Gait Ambulation: Yes Gait Assistance: Contact Guard/Touching assist;Supervision/Verbal cueing Gait Distance (Feet): 150 Feet Assistive device: Rolling walker Gait Assistance Details: Verbal cues for precautions/safety;Verbal cues for safe use of DME/AE;Verbal cues for gait pattern Gait Gait: Yes Gait Pattern: Impaired (R BKA, hop to gait)  Trunk/Postural Assessment  Cervical Assessment Cervical Assessment: Within Functional Limits Thoracic Assessment Thoracic Assessment: Within Functional Limits Lumbar Assessment Lumbar Assessment: Within Functional Limits Postural Control Postural Control: Within Functional Limits  Balance Balance Balance Assessed: Yes Static Sitting Balance Static Sitting - Balance Support: No upper extremity  supported;Feet supported Static Sitting - Level of Assistance: 7: Independent Dynamic Sitting Balance Dynamic Sitting - Balance Support: No upper extremity supported;Feet supported;During functional activity Dynamic Sitting - Level of Assistance: 7: Independent Static Standing Balance Static Standing - Balance Support: Bilateral upper extremity supported Static Standing - Level of Assistance: 5: Stand by assistance Dynamic Standing Balance Dynamic Standing - Balance Support: Bilateral upper extremity supported;During  functional activity Dynamic Standing - Level of Assistance: 5: Stand by assistance Extremity Assessment      RLE Assessment RLE Assessment: Exceptions to [facility] General Strength Comments: R BKA. Grossly 5/5 RLE Strength RLE Overall Strength: Within Functional Limits for tasks assessed LLE Assessment LLE Assessment: Within Functional Limits General Strength Comments: Grossly 5/5    Christian P Manhard PT 10/05/2021, 8:44 AM

## 2021-10-04 NOTE — Progress Notes (Addendum)
PROGRESS NOTE   Subjective/Complaints:  Pt reports wants a shower-  Wants ot get home as soon as he can due to nonverbal autistic grandson- Itching still an issue.   Pain in back chronic.     ROS-  Pt denies SOB, abd pain, CP, N/V/C/D, and vision changes    Objective:   No results found. Recent Labs    10/04/21 0507  WBC 8.4  HGB 10.4*  HCT 32.2*  PLT 385    Recent Labs    10/04/21 0507  NA 137  K 4.2  CL 97*  CO2 30  GLUCOSE 142*  BUN 15  CREATININE 0.78  CALCIUM 8.7*    Intake/Output Summary (Last 24 hours) at 10/04/2021 0850 Last data filed at 10/04/2021 0735 Gross per 24 hour  Intake 720 ml  Output 725 ml  Net -5 ml        Physical Exam: Vital Signs Blood pressure (!) 106/56, pulse (!) 59, temperature 97.8 F (36.6 C), temperature source Oral, resp. rate 18, height 6\' 4"  (1.93 m), weight 104.7 kg, SpO2 96 %.     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 Functional deficits which require 3+ hours per day of interdisciplinary therapy in a comprehensive inpatient rehab setting. Physiatrist is providing close team supervision and 24 hour management of active medical problems listed below. Physiatrist and rehab team continue to assess barriers to discharge/monitor patient progress toward  functional and medical goals  Care Tool:  Bathing  Bathing activity did not occur: Safety/medical concerns Body parts bathed by patient: Right arm, Left arm, Chest, Abdomen, Front perineal area, Buttocks, Right upper leg, Left upper leg, Face, Left lower leg     Body parts n/a: Right lower leg   Bathing assist Assist Level: Minimal Assistance - Patient > 75%     Upper Body Dressing/Undressing Upper body dressing   What is the patient wearing?: Pull over shirt    Upper body assist Assist Level: Set up assist    Lower Body Dressing/Undressing Lower body dressing      What is the patient wearing?: Pants, Underwear/pull up     Lower body assist Assist for lower body dressing: Minimal Assistance - Patient > 75%     Toileting Toileting    Toileting assist Assist for toileting: Minimal Assistance - Patient > 75%     Transfers Chair/bed transfer  Transfers assist     Chair/bed transfer assist level: Contact Guard/Touching assist     Locomotion Ambulation   Ambulation assist      Assist level: Minimal Assistance - Patient >  75% Assistive device: Walker-rolling Max distance: 50'   Walk 10 feet activity   Assist     Assist level: Minimal Assistance - Patient > 75% Assistive device: Walker-rolling   Walk 50 feet activity   Assist    Assist level: Minimal Assistance - Patient > 75% Assistive device: Walker-rolling    Walk 150 feet activity   Assist Walk 150 feet activity did not occur: Safety/medical concerns         Walk 10 feet on uneven surface  activity   Assist Walk 10 feet on uneven surfaces activity did not occur: Safety/medical concerns         Wheelchair     Assist Is the patient using a wheelchair?: Yes Type of Wheelchair: Manual    Wheelchair assist level: Supervision/Verbal cueing Max wheelchair distance: 150'    Wheelchair 50 feet with 2 turns activity    Assist        Assist Level: Supervision/Verbal  cueing   Wheelchair 150 feet activity     Assist      Assist Level: Supervision/Verbal cueing   Blood pressure (!) 106/56, pulse (!) 59, temperature 97.8 F (36.6 C), temperature source Oral, resp. rate 18, height 6\' 4"  (1.93 m), weight 104.7 kg, SpO2 96 %.  Medical Problem List and Plan: 1. Functional deficits secondary to right BKA             -patient may not shower until wound VAC is discontinued- can shower after Vac removed today             -ELOS/Goals: 5-7  Con't CIR- PT and OT- pt educated to keep leg as straight as possible, esp when not wearing limb guard to prevent contracture- which would limit prosthesis use-   2/22- con't CIR- PT and OT- d/c 2/24 set- pt happy about this- remove wound VAC today- order placed 2.  Antithrombotics: -DVT/anticoagulation:  Pharmaceutical: Lovenox             -antiplatelet therapy: Aspirin 81 mg daily Plavix 75 mg daily for right SFA and right popliteal stents placed in December 2022 3. Pain Management: Oxycodone 10-15 mg every 4 hours.  Robaxin 500 mg p.o. every 6 hours as needed gabapentin 600 mg 3 times daily  2/20- usually on percocet 10 /325 mg q4 hours or so at home- pain OK  2/22- pain stable- con't regimen 4. Mood: Celexa 20 mg/day, Klonopin 0.5 mg takes twice daily at home             -antipsychotic agents: Not applicable 5. Neuropsych: This patient is capable of making decisions on his own behalf. 6. Skin/Wound Care: Has wound VAC for right lower extremity, monitor for signs of skin breakdown left foot and buttocks region, use of sulfate 220 mg daily for 14 days  2/22- wrote for wound VAC ot be removed and dressing to be placed OVER srhinker per Dr 3/22 protocol- also ordered 2 shrinkers.  7. Fluids/Electrolytes/Nutrition: Carb modified diet monitor I's and O's, Ensure twice daily 8.  Constipation related to opioids use MiraLAX as needed 9.  GERD Protonix 40 mg/day  10.  Diabetes metformin 500 mg twice daily,  sliding scale  moderate, Semglee 20 units subcu nightly (may need to restart Novalog mealtime 4U  TID)  CBG (last 3)  Recent Labs    10/03/21 1652 10/03/21 2201 10/04/21 0659  GLUCAP 195* 193* 139*   2/22- CBGs still more elevated, but less than 200- increased semglee yesterday- con't regimen 11.  Mild hypoalbuminemia- cont prosource and ensure supplements 12.  Mild hyponatremia , may be IV related asymptomatic will monitor   2/20- Na 130- stable 13.  ABLA - has had 2 surgeries in last month hgb prior to TMA was 14 but fluctuating in 10-11 gm range since that time  14. Itching  2/20- started Benadryl 12.5 mg q6 hours prn for itching.   2/21- itching is better- con't regimen  I spent a total of  36  minutes on total care today- >50% coordination of care- due to d/w PA, [facility] and nursing about wound VAC and shirnkers  LOS: 4 days A FACE TO FACE EVALUATION WAS PERFORMED  Teah Votaw 10/04/2021, 8:50 AM

## 2021-10-04 NOTE — Progress Notes (Signed)
Orthopedic Tech Progress Note Patient Details:  Cody Pena 22-Sep-1960 [id number]  Called in order to [facility] for 2 MD [facility] for patient    Patient ID: Cody Pena, male   DOB: 09/10/1960, 61 y.o.   MRN: [id number]  [name] 10/04/2021, 8:50 AM

## 2021-10-04 NOTE — Progress Notes (Signed)
Occupational Therapy Session Note  Patient Details  Name: Cody Pena MRN: [id number] Date of Birth: [birth year]  Today's Date: 10/04/2021 OT Individual Time: 1401-1459 OT Individual Time Calculation (min): 58 min    Short Term Goals: Week 1:  OT Short Term Goal 1 (Week 1): STGs=LTGs due to ELOS  Skilled Therapeutic Interventions/Progress Updates:  Pt greeted EOB  agreeable to OT intervention. Session focus on BADL reeducation, functional mobility, dynamic standing balance and decreasing overall caregiver burden. RN taking wound vac off and pt wanting to shower, verbal order from Dan to get pt in shower with RLE covered. OTA covered residual limb and pt completed stand pivot from EOB>w/c with supervision. Pt completed additional squat pivot from w/c>shower seat with CGA. Pt completed bathing from shower seat with overall supervision using lateral leans for LB bathing. Pt exited shower in similar manner. Pt completed dressing from w/c with s/u for UB dressing and CGA for LB Dressing needing balance assist in standing while pt pulled pants up to waist line. Pt left in supine with all needs within reach.                         Therapy Documentation Precautions:  Precautions Precautions: Fall Precaution Comments:  Required Braces or Orthoses: Other Brace Other Brace: RLE limb protector Restrictions Weight Bearing Restrictions: Yes RLE Weight Bearing: Non weight bearing LLE Weight Bearing: Weight bearing as tolerated   Pain: no pain reported during session     Therapy/Group: Individual Therapy  Barron Schmid 10/04/2021, 3:42 PM

## 2021-10-04 NOTE — Discharge Summary (Signed)
Physician Discharge Summary  Patient ID: KULLEN TOMASETTI MRN: [id number] DOB/AGE: 12/02/1960 61 y.o.  Admit date: 09/30/2021 Discharge date: 10/05/2021  Discharge Diagnoses:  Principal Problem:   Below-knee amputation of right lower extremity (HCC) Active Problems:   S/P BKA (below knee amputation), right (HCC) DVT prophylaxis Mood stabilization GERD Acute blood loss anemia Diabetes mellitus Tobacco abuse  Discharged Condition: Stable  Significant Diagnostic Studies: MR FOOT RIGHT WO CONTRAST  Result Date: 09/17/2021 CLINICAL DATA:  Fevers and chills.  Status post amputation. EXAM: MRI OF THE RIGHT FOREFOOT WITHOUT CONTRAST TECHNIQUE: Multiplanar, multisequence MR imaging of the right forefoot was performed. No intravenous contrast was administered. COMPARISON:  None. FINDINGS: Bones/Joint/Cartilage Transmetatarsal amputation through the metatarsal necks. Bone marrow edema throughout the residual metatarsals which may be reactive secondary to recent amputation versus osteomyelitis. Soft tissue wound overlies the medial aspect of the first metatarsal stump. Complex fluid collection abutting the fifth metatarsal stump measuring 2.2 x 1.5 x 2 cm which may reflect a postoperative hematoma versus abscess. Normal alignment. No joint effusion. Ligaments Collateral ligaments are intact. Lisfranc ligament is intact. Anterior and posterior talofibular ligaments are intact. Calcaneofibular ligament is intact. Deltoid ligament is intact. Muscles and Tendons Moderate tendinosis of the peroneus longus with an interstitial tear. Mild tendinosis of the peroneus brevis. Flexor, extensor and Achilles tendons are intact. Generalized T2 hyperintense signal throughout the plantar musculature likely neurogenic given the patient's history of diabetes. Soft tissue No other fluid collection or hematoma. No soft tissue mass. Severe soft tissue edema in the lower leg, around the ankle, and extending into the dorsal  aspect of the foot as can be seen with cellulitis. IMPRESSION: 1. Soft tissue wound overlies the medial aspect of the first metatarsal stump. Transmetatarsal amputation through the metatarsal necks. Bone marrow edema throughout the residual metatarsals which may be reactive postsurgical changes secondary to recent amputation versus osteomyelitis. Complex fluid collection abutting the fifth metatarsal stump measuring 2.2 x 1.5 x 2 cm which may reflect a postoperative hematoma versus abscess. 2. Moderate tendinosis of the peroneus longus with an interstitial tear. Electronically Signed   By: Elige Ko M.D.   On: 09/17/2021 07:50   DG Foot Complete Right  Result Date: 09/16/2021 CLINICAL DATA:  Pain. Amputation on January 13. Now with fever, whose Ng, and redness at the surgical site. EXAM: RIGHT FOOT COMPLETE - 3+ VIEW COMPARISON:  07/23/2021 FINDINGS: Interval amputation of the forefoot at the mid metatarsal level. Bony margins appear intact. A small amount of sub cutaneous soft tissue gas is demonstrated over the first metatarsal stump, possibly indicating infection or ulceration. Mild degenerative changes in the intertarsal joints. No evidence of acute fracture or dislocation. IMPRESSION: Forefoot amputation at the mid metatarsal level. Bones appear intact. Soft tissue gas adjacent to the first metatarsal stump suggesting infection. Electronically Signed   By: Burman Nieves M.D.   On: 09/16/2021 18:06   VAS [place] ABI WITH/WO TBI  Result Date: 09/18/2021  LOWER EXTREMITY DOPPLER STUDY Patient Name:  YUNIOR JAIN  Date of Exam:   09/17/2021 Medical Rec #: [id number]        Accession #:    [id number] Date of Birth: 01-Aug-1961       Patient Gender: M Patient Age:   61 years Exam Location:  [facility] Procedure:      VAS [place] ABI WITH/WO TBI Referring Phys: SARA NEAL --------------------------------------------------------------------------------  Indications: Ulceration, and peripheral artery disease.  High Risk Factors: Hypertension, hyperlipidemia, Diabetes, past history  of                    smoking, now only vapes. Other Factors: Right transmet amputation 08/25/21.  Vascular Interventions: 08/11/21 Right SFA and above knee popliteal stenting. Limitations: Today's exam was limited due to right transmet amputation. Comparison Study: Prior ABIs done 08/03/21 Performing Technologist: Sherren Kerns RVS  Examination Guidelines: A complete evaluation includes at minimum, Doppler waveform signals and systolic blood pressure reading at the level of bilateral brachial, anterior tibial, and posterior tibial arteries, when vessel segments are accessible. Bilateral testing is considered an integral part of a complete examination. Photoelectric Plethysmograph (PPG) waveforms and toe systolic pressure readings are included as required and additional duplex testing as needed. Limited examinations for reoccurring indications may be performed as noted.  ABI Findings: +---------+------------------+-----+---------+-------------------+  Right     Rt Pressure (mmHg) Index Waveform  Comment              +---------+------------------+-----+---------+-------------------+  Brachial  142                      triphasic                      +---------+------------------+-----+---------+-------------------+  PTA       136                0.96  biphasic                       +---------+------------------+-----+---------+-------------------+  DP        126                0.89  biphasic                       +---------+------------------+-----+---------+-------------------+  Great Toe                                    transmet amputation  +---------+------------------+-----+---------+-------------------+ +---------+------------------+-----+---------+-------+  Left      Lt Pressure (mmHg) Index Waveform  Comment  +---------+------------------+-----+---------+-------+  Brachial  130                      triphasic           +---------+------------------+-----+---------+-------+  PTA       94                 0.66  biphasic           +---------+------------------+-----+---------+-------+  DP        59                 0.42  biphasic           +---------+------------------+-----+---------+-------+  Great Toe 45                 0.32                     +---------+------------------+-----+---------+-------+ +-------+-----------+-----------+------------+------------+  ABI/TBI Today's ABI Today's TBI Previous ABI Previous TBI  +-------+-----------+-----------+------------+------------+  Right   0.96        amp         0.67         0.40          +-------+-----------+-----------+------------+------------+  Left    0.66  0.32        0.76         0.58          +-------+-----------+-----------+------------+------------+ Right ABIs appear increased compared to prior study on 08/03/21. Left ABIs and TBIs appear decreased compared to prior study on 08/03/21.  Summary: Right: Resting right ankle-brachial index is within normal range. No evidence of significant right lower extremity arterial disease. Left: Resting left ankle-brachial index indicates moderate left lower extremity arterial disease. The left toe-brachial index is abnormal.  *See table(s) above for measurements and observations.  Electronically signed by Gerarda Fraction on 09/18/2021 at 7:45:31 PM.    Final    [place] EKG SITE RITE  Result Date: 09/19/2021 If Site Rite image not attached, placement could not be confirmed due to current cardiac rhythm.   Labs:  Basic Metabolic Panel: Recent Labs  Lab 09/30/21 1833 10/04/21 0507  NA 130* 137  K 4.6 4.2  CL 95* 97*  CO2 26 30  GLUCOSE 249* 142*  BUN 15 15  CREATININE 0.95 0.78  CALCIUM 7.9* 8.7*    CBC: Recent Labs  Lab 09/29/21 0155 10/04/21 0507  WBC 12.7* 8.4  NEUTROABS  --  4.2  HGB 10.0* 10.4*  HCT 30.0* 32.2*  MCV 94.3 94.4  PLT 214 385    CBG: Recent Labs  Lab 10/03/21 2201 10/04/21 0659 10/04/21 1211  10/04/21 1658 10/04/21 2059  GLUCAP 193* 139* 178* 193* 203*   Family history.  Positive for hypertension as well as hyperlipidemia.  Denies any colon cancer esophageal cancer or rectal cancer  Brief HPI:   MALLIE CAROSELLA is a 61 y.o. right-handed male with history of severe peripheral arterial disease diabetes mellitus hypertension chronic low back pain and depression who was admitted for dehiscence of TMA of the right foot 09/27/2021.  The patient had right transmetatarsal amputation 08/25/2021 for a nonhealing diabetic ulcer and postoperative wound infection.  Blood sugars were up to 252 on admission he was managed with insulin therapy.  He was switched to [facility].  His hypertension was better controlled.  Recent arteriogram on 08/11/2021 showed severe right SFA and popliteal occlusion underwent stenting at that time.  He had recently given up smoking.  The patient has chronic narcotic analgesic for back pain and takes approxi-5-6 Percocet per day.  He has chronic anxiety.  Patient was admitted for a comprehensive rehab program.   Hospital Course: LANNY VONWALD was admitted to rehab 09/30/2021 for inpatient therapies to consist of PT, ST and OT at least three hours five days a week. Past admission physiatrist, therapy team and rehab RN have worked together to provide customized collaborative inpatient rehab.  Pertaining to patient's right BKA surgical site dressed and follow-up per Dr. Lajoyce Corners.  Subcutaneous Lovenox for DVT prophylaxis.  Patient also remained on low-dose aspirin.  Blood sugars managed maintained on Semglee as well as Glucophage follow-up outpatient.  Chronic back pain with the use of Robaxin as needed as well as oxycodone for breakthrough pain he was on scheduled Neurontin 600 mg 3 times daily.  Mood stabilization with the use of Celexa as well as Klonopin with emotional support provided.  Lipitor ongoing for hyperlipidemia.  Bouts of constipation resolved with laxative assistance.  He did  have some GERD maintained on PPI.  Noted history of tobacco abuse maintained on NicoDerm patch receiving counseling regards to cessation of nicotine products.   Blood pressures were monitored on TID basis and controlled  Diabetes has been monitored with  ac/hs CBG checks and SSI was use prn for tighter BS control.    Rehab course: During patient's stay in rehab weekly team conferences were held to monitor patient's progress, set goals and discuss barriers to discharge. At admission, patient required min mod assist for mobility  Physical exam.  Blood pressure 118/70 pulse 80 temperature 98 respirations 18 oxygen saturations 94% room air Constitutional.  No acute distress HEENT Head.  Normocephalic and atraumatic Eyes.  Pupils round and reactive to light no discharge.nystagmus Neck.  Supple nontender no JVD without thyromegaly Cardiac regular rate rhythm any extra sounds or murmur heard Abdomen.  Soft nontender positive bowel sounds without rebound Respiratory effort normal no respiratory distress without wheeze Neurologic.  Cranial nerves II through XII intact motor strength 5/5 in bilateral deltoid bicep tricep grip left hip flexor knee extensors ankle dorsi plantarflexion, right lower extremity status post BKA with wound VAC in place.  Sensory exam normal sensation.  He/She  has had improvement in activity tolerance, balance, postural control as well as ability to compensate for deficits. He/She has had improvement in functional use RUE/LUE  and RLE/LLE as well as improvement in awareness.  Sit to stand rolling walker stand pivot transfers from various surfaces contact-guard.  Propels wheelchair independently.  Minimal assist lower body bathing minimal assist lower body dressing set up upper body bathing set up upper body dressing.  Supine to sit supine to prone on mat table with supervision.  Full family teaching completed and plan discharged to home.         Disposition: Discharge to  home    Diet: Diabetic diet  Special Instructions: No driving smoking or alcohol  Medications at discharge. 1.  Tylenol as needed 2.  Vitamin C 500 mg p.o. twice daily 3.  Aspirin 81 mg p.o. daily 4.  Lipitor 80 mg p.o. daily 5.  Celexa 20 mg p.o. daily 6.  Klonopin 0.5 mg p.o. twice daily 7.  Neurontin 600 mg p.o. 3 times daily 8.  Semglee 22 units nightly 9.  Glucophage 500 mg p.o. twice daily 10.  Robaxin 500 mg p.o. every 6 hours as needed muscle spasms 11.  NicoDerm patch taper as directed 12.  Oxycodone 15 mg every 4 hours as needed pain 13.  Protonix 40 mg p.o. daily 14.  Zinc sulfate 220 mg p.o. daily  30-35 minutes were spent completing discharge summary and discharge planning  Discharge Instructions     Ambulatory referral to Physical Medicine Rehab   Complete by: As directed    Moderate complexity follow-up 1 to 2 weeks right BKA        Follow-up Information     Lovorn, Aundra MilletMegan, MD Follow up.   Specialty: Physical Medicine and Rehabilitation Why: Office to call for appointment Contact information: [address] [place] [address] [phone]         Nadara Mustarduda, Marcus V, MD Follow up.   Specialty: Orthopedic Surgery Why: Call for appointment Contact information: [address] [place] [address] [phone]                 Signed: Charlton AmorDaniel J Lyvonne Cassell 10/05/2021, 5:16 AM

## 2021-10-04 NOTE — Progress Notes (Signed)
Physical Therapy Session Note  Patient Details  Name: Cody Pena MRN: [id number] Date of Birth: [birth year]  Today's Date: 10/04/2021 PT Individual Time: 0805-0900, [id number] PT Individual Time Calculation (min): 55 min, 58 min   Short Term Goals: Week 1:  PT Short Term Goal 1 (Week 1): =LTG due to ELOS  Skilled Therapeutic Interventions/Progress Updates:    Session 1: Pt recd as hand off from OT, seated in w/c. No complaint of pain. Pt propelled w/c with BUE throughout session, >250 ft. In // bars, pt instructed on and navigated 6" step CGA. Progressed to 6" step, which pt navigated x 2 with min A to stabilize walker. Terminated at this time d/t fatigue. Pt then ambulated x 140 ft with close supervision. Pt returned to room and had several questions about POC and prognosis. Pt returned to bed with Stand pivot transfer and was left with all needs in reach and alarm active.   Session 2: Pt seated EOB on arrival, No complaint of pain. Pt performed ambulatory transfer to bathroom with CGA x 2 in order to check off pt's wife to assist with transfers. While performing transfer with therapist, pt became distracted while moving wound vac line and had LOB, max A to recover. Once seated on bed, extensive discussion about safety and maintaining safety precautions. Pt's wife [name] then trained on and demoed transfer with appropriate guarding technique. Pt then performed supervision Stand pivot transfer to w/c. Pt propelled w/c with BUE >500 ft, x 300 ft and x 200 ft with direction to propel as fast as possible for endurance while maintaining conversation. Seated rest break x 2. Pt returned to room and performed Stand pivot transfer with bed rail with CGA. Discussed safer alternatives to set up transfer. Pt remained EOB and was left with all needs in reach and alarm active.   Therapy Documentation Precautions:  Precautions Precautions: Fall Precaution Comments: wound vac on RLE Required Braces or  Orthoses: Other Brace Other Brace: RLE limb protector Restrictions Weight Bearing Restrictions: Yes RLE Weight Bearing: Non weight bearing LLE Weight Bearing: Weight bearing as tolerated General:       Therapy/Group: Individual Therapy  Juluis Rainier 10/04/2021, 9:58 AM

## 2021-10-04 NOTE — Progress Notes (Signed)
Wound vac removed per orders. Staples in place. Moderate amount of serosanguinous drainage noted with pink incisional border. No signs or symptoms of infection present. Patient tolerated removal of wound vac with minimal complaints of pain.

## 2021-10-05 LAB — GLUCOSE, CAPILLARY
Glucose-Capillary: 112 mg/dL — ABNORMAL HIGH (ref 70–99)
Glucose-Capillary: 175 mg/dL — ABNORMAL HIGH (ref 70–99)
Glucose-Capillary: 199 mg/dL — ABNORMAL HIGH (ref 70–99)
Glucose-Capillary: 155 mg/dL — ABNORMAL HIGH (ref 70–99)

## 2021-10-05 MED ORDER — NICOTINE 21 MG/24HR TD PT24: MEDICATED_PATCH | TRANSDERMAL | 0 refills | Status: DC

## 2021-10-05 MED ORDER — ATORVASTATIN CALCIUM 80 MG PO TABS: 80.0000 mg | ORAL_TABLET | Freq: Every day | ORAL | 3 refills | Status: DC

## 2021-10-05 MED ORDER — INSULIN GLARGINE-YFGN 100 UNIT/ML ~~LOC~~ SOLN: 22.0000 [IU] | Freq: Every day | SUBCUTANEOUS | 11 refills | Status: DC

## 2021-10-05 MED ORDER — GABAPENTIN 600 MG PO TABS: 600.0000 mg | ORAL_TABLET | Freq: Three times a day (TID) | ORAL | 3 refills | Status: DC

## 2021-10-05 MED ORDER — METHOCARBAMOL 500 MG PO TABS
500.0000 mg | ORAL_TABLET | Freq: Four times a day (QID) | ORAL | 0 refills | Status: DC | PRN
Start: 2021-10-05 — End: 2023-01-06

## 2021-10-05 MED ORDER — METFORMIN HCL 500 MG PO TABS: 500.0000 mg | ORAL_TABLET | Freq: Two times a day (BID) | ORAL | 11 refills | Status: DC

## 2021-10-05 MED ORDER — ASCORBIC ACID 500 MG PO TABS: 500.0000 mg | ORAL_TABLET | Freq: Two times a day (BID) | ORAL | Status: DC

## 2021-10-05 MED ORDER — ACETAMINOPHEN 325 MG PO TABS: 325.0000 mg | ORAL_TABLET | ORAL | Status: DC | PRN

## 2021-10-05 MED ORDER — ALBUTEROL SULFATE HFA 108 (90 BASE) MCG/ACT IN AERS: 2.0000 | INHALATION_SPRAY | Freq: Four times a day (QID) | RESPIRATORY_TRACT | 0 refills | Status: DC | PRN

## 2021-10-05 MED ORDER — ZINC SULFATE 220 (50 ZN) MG PO CAPS: 220.0000 mg | ORAL_CAPSULE | Freq: Every day | ORAL | 0 refills | Status: DC

## 2021-10-05 MED ORDER — CLONAZEPAM 0.5 MG PO TABS: 0.5000 mg | ORAL_TABLET | Freq: Two times a day (BID) | ORAL | 0 refills | Status: DC

## 2021-10-05 MED ORDER — CITALOPRAM HYDROBROMIDE 20 MG PO TABS
20.0000 mg | ORAL_TABLET | Freq: Every day | ORAL | 0 refills | Status: DC
Start: 2021-10-05 — End: 2021-12-26

## 2021-10-05 MED ORDER — OXYCODONE HCL 15 MG PO TABS: 15.0000 mg | ORAL_TABLET | ORAL | 0 refills | Status: DC | PRN

## 2021-10-05 MED ORDER — PANTOPRAZOLE SODIUM 40 MG PO TBEC
40.0000 mg | DELAYED_RELEASE_TABLET | Freq: Every day | ORAL | 0 refills | Status: DC
Start: 2021-10-05 — End: 2021-12-26

## 2021-10-05 NOTE — Progress Notes (Signed)
Physical Therapy Session Note  Patient Details  Name: Cody Pena MRN: [id number] Date of Birth: 07/30/1961  Today's Date: 10/05/2021 PT Individual Time: 0800-0857 PT Individual Time Calculation (min): 57 min   Short Term Goals: Week 1:  PT Short Term Goal 1 (Week 1): =LTG due to ELOS  Skilled Therapeutic Interventions/Progress Updates:     Pt sitting EOB to start session with his wife at bedside. Pt agreeable to PT tx. Denies pain other than baseline chronic LBP, unrated. Wife and pt asked about confidence regarding upcoming DC tomorrow, both without questions or concerns and feel ready for upcoming DC. Pt donned limb guard with setupA while seated EOB. Completed squat<>pivot transfer with CGA from EOB to w/c, towards his L side. He propelled himself mod I in w/c throughout session >222f, in hallways using BUE to propel. Completed car transfer from w/c to sedan height [facility]<>pivot but then when ed on on squat<>pivot transfer, he completed this with supervision assist.   Next, worked on ramp training as pt has ramped entrance into home. Completed these both at w/c and ambulatory level. Completed w/c level with supervision assist and ambulating with CGA and RW, ~89framp. Pt with good safety awareness during both negotiations and no evidence of LOB.  Completed DC assessment (motor, sensory, MMT, balance, vision, etc) - see DC note for details.   Completed gait training [id number] CGA and RW - demo's hop to gait pattern, decreased speed, and downward gaze. No LOB but onset of fatigue in shoulders limiting his distance. Pt reporting he's ambulated further distances more consistently, but fatigued today.   Education throughout session on amputee support, limb loss ed, daily skin checks, shoe on LLE, mirror therapy, etc. Pt verbalized understanding of all.  Concluded session seated EOB, bed alarm on, call bell in reach, all needs met.    Therapy Documentation Precautions:  Precautions Precautions: Fall Precaution Comments: R BKA Required Braces or Orthoses: Other Brace Other Brace: R limb guard Restrictions Weight Bearing Restrictions: Yes RLE Weight Bearing: Non weight bearing LLE Weight Bearing: Weight bearing as tolerated General:    Therapy/Group: Individual Therapy  ChriAlger Simons3/2023, 10:17 AM

## 2021-10-05 NOTE — Progress Notes (Signed)
Patient ID: Cody Pena, male   DOB: 1960/11/15, 61 y.o.   MRN: [id number]  Met with pt to discuss if wants this worker to get a tub bench for him or does he want to get on his own. He would rather get it on his own due to cost. Also discussed follow up therapies, he wants to save his visits for when he gets his prothesis. Have messaged team to make sure in agreement with this. Wife here and wanted pt to make decision.

## 2021-10-05 NOTE — Progress Notes (Signed)
PROGRESS NOTE   Subjective/Complaints:  Pt got VAC off-  LBM overnight.  Pain stable.  Itching resolved.    ROS-  Pt denies SOB, abd pain, CP, N/V/C/D, and vision changes    Objective:   No results found. Recent Labs    10/04/21 0507  WBC 8.4  HGB 10.4*  HCT 32.2*  PLT 385    Recent Labs    10/04/21 0507  NA 137  K 4.2  CL 97*  CO2 30  GLUCOSE 142*  BUN 15  CREATININE 0.78  CALCIUM 8.7*    Intake/Output Summary (Last 24 hours) at 10/05/2021 0900 Last data filed at 10/05/2021 6834 Gross per 24 hour  Intake 480 ml  Output 1525 ml  Net -1045 ml        Physical Exam: Vital Signs Blood pressure 106/60, pulse (!) 58, temperature 97.6 F (36.4 C), temperature source Oral, resp. rate 18, height 6\' 4"  (1.93 m), weight 104.7 kg, SpO2 94 %.      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 Functional deficits which require 3+ hours per day of interdisciplinary therapy in a comprehensive inpatient rehab setting. Physiatrist is providing close team supervision and 24 hour management of active medical problems listed below. Physiatrist and rehab team continue to assess barriers to discharge/monitor patient progress toward functional and medical goals  Care Tool:  Bathing  Bathing activity did not  occur: Safety/medical concerns Body parts bathed by patient: Right arm, Left arm, Chest, Abdomen, Front perineal area, Buttocks, Right upper leg, Left upper leg, Face, Left lower leg     Body parts n/a: Right lower leg   Bathing assist Assist Level: Supervision/Verbal cueing     Upper Body Dressing/Undressing Upper body dressing   What is the patient wearing?: Pull over shirt    Upper body assist Assist Level: Set up assist    Lower Body Dressing/Undressing Lower body dressing      What is the patient wearing?: Pants, Underwear/pull up     Lower body assist Assist for lower body dressing: Contact Guard/Touching assist     Toileting Toileting    Toileting assist Assist for toileting: Minimal Assistance - Patient > 75%     Transfers Chair/bed transfer  Transfers assist     Chair/bed transfer assist level: Supervision/Verbal cueing     Locomotion Ambulation   Ambulation assist      Assist level: Supervision/Verbal cueing Assistive device: Walker-rolling Max distance: 140 ft   Walk 10 feet activity   Assist     Assist level: Supervision/Verbal cueing Assistive device: Walker-rolling   Walk 50  feet activity   Assist    Assist level: Supervision/Verbal cueing Assistive device: Walker-rolling    Walk 150 feet activity   Assist Walk 150 feet activity did not occur: Safety/medical concerns         Walk 10 feet on uneven surface  activity   Assist Walk 10 feet on uneven surfaces activity did not occur: Safety/medical concerns         Wheelchair     Assist Is the patient using a wheelchair?: Yes Type of Wheelchair: Manual    Wheelchair assist level: Supervision/Verbal cueing Max wheelchair distance: 150'    Wheelchair 50 feet with 2 turns activity    Assist        Assist Level: Supervision/Verbal cueing   Wheelchair 150 feet activity     Assist      Assist Level: Supervision/Verbal cueing   Blood pressure  106/60, pulse (!) 58, temperature 97.6 F (36.4 C), temperature source Oral, resp. rate 18, height 6\' 4"  (1.93 m), weight 104.7 kg, SpO2 94 %.  Medical Problem List and Plan: 1. Functional deficits secondary to right BKA             -patient may not shower until wound VAC is discontinued- can shower after Vac removed today             -ELOS/Goals: 5-7  Con't CIR- PT and OT- pt educated to keep leg as straight as possible, esp when not wearing limb guard to prevent contracture- which would limit prosthesis use-   2/23- Con't CIR- PT and OT- d/c tomorrow 2.  Antithrombotics: -DVT/anticoagulation:  Pharmaceutical: Lovenox             -antiplatelet therapy: Aspirin 81 mg daily Plavix 75 mg daily for right SFA and right popliteal stents placed in December 2022 3. Pain Management: Oxycodone 10-15 mg every 4 hours.  Robaxin 500 mg p.o. every 6 hours as needed gabapentin 600 mg 3 times daily  2/20- usually on percocet 10 /325 mg q4 hours or so at home- pain OK  2/23- pain stable- con't regimen 4. Mood: Celexa 20 mg/day, Klonopin 0.5 mg takes twice daily at home             -antipsychotic agents: Not applicable 5. Neuropsych: This patient is capable of making decisions on his own behalf. 6. Skin/Wound Care: Has wound VAC for right lower extremity, monitor for signs of skin breakdown left foot and buttocks region, use of sulfate 220 mg daily for 14 days  2/22- wrote for wound VAC ot be removed and dressing to be placed OVER srhinker per Dr Audrie Lia protocol- also ordered 2 shrinkers.   2/23- pt has shrinkers- VAC off- con't wound care 7. Fluids/Electrolytes/Nutrition: Carb modified diet monitor I's and O's, Ensure twice daily 8.  Constipation related to opioids use MiraLAX as needed 9.  GERD Protonix 40 mg/day  10.  Diabetes metformin 500 mg twice daily,  sliding scale moderate, Semglee 20 units subcu nightly (may need to restart Novalog mealtime 4U  TID)  CBG (last 3)  Recent Labs    10/04/21 1658  10/04/21 2059 10/05/21 0607  GLUCAP 193* 203* 112*   2/22- CBGs still more elevated, but less than 200- increased semglee yesterday- con't regimen  2/23- CBGs 112-203- is more elevated with meals- will d/w pt if wants to restart novolog with meals  11.  Mild hypoalbuminemia- cont prosource and ensure supplements 12.  Mild hyponatremia , may be IV related asymptomatic will monitor  2/20- Na 130- stable 13.  ABLA - has had 2 surgeries in last month hgb prior to TMA was 14 but fluctuating in 10-11 gm range since that time  14. Itching  2/20- started Benadryl 12.5 mg q6 hours prn for itching.   2/23- itching resolved now that VAC is off 15. Dispo  2/23- will need at least 1 f/u with me in clinic  I spent a total of 37   minutes on total care today- >50% coordination of care- due to seeing pt 2x to discuss Novolog  LOS: 5 days A FACE TO FACE EVALUATION WAS PERFORMED  Aqil Goetting 10/05/2021, 9:00 AM

## 2021-10-05 NOTE — Progress Notes (Signed)
Occupational Therapy Discharge Summary  Patient Details  Name: Cody Pena MRN: [id number] Date of Birth: 1961-04-13   Patient has met 7 of 7 long term goals due to improved activity tolerance, improved balance, postural control, ability to compensate for deficits, improved awareness, and improved coordination.  Patient to discharge at overall Supervision level.  Patient's care partner is independent to provide the necessary physical assistance at discharge.  Pt is Supervision for squat pivot and stand pivot transfers with RW. Wife has been present for several sessions for informal education.   Reasons goals not met: NA  Recommendation:  No further OT at this time, pt to f/u with OPPT when he gets prosthetic  Equipment: Drop arm BSC, recommend TTB and pt aware where to purchase  Reasons for discharge: treatment goals met and discharge from hospital  Patient/family agrees with progress made and goals achieved: Yes  OT Discharge Precautions/Restrictions  Precautions Precautions: Fall Precaution Comments: R BKA Other Brace: R limb guard Restrictions Weight Bearing Restrictions: Yes RLE Weight Bearing: Non weight bearing LLE Weight Bearing: Weight bearing as tolerated Vital Signs Therapy Vitals Temp: 97.6 F (36.4 C) Temp Source: Oral Pulse Rate: 72 Resp: 15 BP: 119/62 Patient Position (if appropriate): Sitting Oxygen Therapy SpO2: 98 % O2 Device: Room Air Pain Pain Assessment Pain Scale: 0-10 Pain Score: 0-No pain Pain Type: Chronic pain Pain Location: Back Pain Orientation: Mid;Lower Pain Descriptors / Indicators: Aching Pain Frequency: Constant Pain Onset: Progressive Patients Stated Pain Goal: 2 Pain Intervention(s): Medication (See eMAR) ADL ADL Eating: Independent Grooming: Setup Where Assessed-Grooming: Edge of bed Upper Body Bathing: Setup Where Assessed-Upper Body Bathing: Edge of bed Lower Body Bathing: Supervision/safety Where Assessed-Lower  Body Bathing: Edge of bed Upper Body Dressing: Setup Where Assessed-Upper Body Dressing: Edge of bed Lower Body Dressing: Supervision/safety Toileting: Supervision/safety Where Assessed-Toileting: Glass blower/designer: Close supervision Toilet Transfer Method: Counselling psychologist: Energy manager: Not assessed Vision Baseline Vision/History: 1 Wears glasses Patient Visual Report: No change from baseline Vision Assessment?: No apparent visual deficits Perception  Perception: Within Functional Limits Praxis Praxis: Intact Cognition Overall Cognitive Status: Within Functional Limits for tasks assessed Arousal/Alertness: Awake/alert Orientation Level: Oriented X4 Year: 2023 Month: February Day of Week: Correct Attention: Focused;Sustained Focused Attention: Appears intact Sustained Attention: Appears intact Memory: Appears intact Immediate Memory Recall: Sock;Blue;Bed Memory Recall Sock: Without Cue Memory Recall Blue: Without Cue Memory Recall Bed: Without Cue Awareness: Appears intact Problem Solving: Appears intact Safety/Judgment: Appears intact Sensation Sensation Light Touch: Impaired by gross assessment Light Touch Impaired Details: Impaired RLE Coordination Gross Motor Movements are Fluid and Coordinated: No Fine Motor Movements are Fluid and Coordinated: Yes Coordination and Movement Description: Affected by limb loss. Significant improvement since date of evaluatoin Motor  Motor Motor: Within Functional Limits Motor - Skilled Clinical Observations: Affected by limb loss and balance deficits Mobility  Bed Mobility Bed Mobility: Rolling Right;Rolling Left;Supine to Sit;Sit to Supine Rolling Right: Independent Rolling Left: Independent Supine to Sit: Independent Sit to Supine: Independent Transfers Sit to Stand: Supervision/Verbal cueing Stand to Sit: Supervision/Verbal cueing  Trunk/Postural Assessment  Cervical  Assessment Cervical Assessment: Within Functional Limits Thoracic Assessment Thoracic Assessment: Within Functional Limits Lumbar Assessment Lumbar Assessment: Within Functional Limits Postural Control Postural Control: Within Functional Limits  Balance Balance Balance Assessed: Yes Static Sitting Balance Static Sitting - Balance Support: No upper extremity supported;Feet supported Static Sitting - Level of Assistance: 7: Independent Dynamic Sitting Balance Dynamic Sitting - Balance Support: No upper extremity supported;Feet  supported;During functional activity Dynamic Sitting - Level of Assistance: 7: Independent Static Standing Balance Static Standing - Balance Support: Bilateral upper extremity supported Static Standing - Level of Assistance: 5: Stand by assistance Extremity/Trunk Assessment RUE Assessment RUE Assessment: Within Functional Limits LUE Assessment LUE Assessment: Within Functional Limits   Viona Gilmore 10/05/2021, 4:57 PM

## 2021-10-05 NOTE — Progress Notes (Signed)
Occupational Therapy Session Note  Patient Details  Name: Cody Pena MRN: [id number] Date of Birth: 11-18-1960  Today's Date: 10/05/2021 OT Individual Time: 1100-1202 and 1445-1635 OT Individual Time Calculation (min): 62 min and 50 min   Short Term Goals: Week 1:  OT Short Term Goal 1 (Week 1): STGs=LTGs due to ELOS   Skilled Therapeutic Interventions/Progress Updates:    Session 1: Pt greeted at time of session sitting up at EOB with wife Opal Sidles present at beginning of session. Pt politely declining ADL at beginning of session and pt wanting to do shower/ADL routine in afternoon session. Pt aware of grad day and going home tomorrow. Pt having several questions at beginning of session regarding getting prosthetic, phantom pain, and prognosis/typical recovery period. Pt performing ambulatory transfer to/from bed > bathroom > wheelchair with Supervision, simulated toileting tasks with supervision as well standing and sitting. Self propel room <> tub shower room with Mod I and educated on TTB for home tub. Pt performing with supervision after therapist demonstration. Pt aware of where to purchase on his own per his request. Extensive discussion regarding coping with limb loss. Back in room, squat pivot to bed Supervision. Call bell in reach all needs met.   Session 2: Pt greeted at time of session sitting at EOB with spouse and daughter present at time of session who remained throughout. Pt politely declined shower at this time 2/2 fatigue but did want to review UB and LB HEP for both theraband and weighted exercise, provided with high level resistance band as well. Demonstrating for pt, pt with no further questions at this time. DME for home delivered during session for wheelchair and drop arm BSC. Pt squat pivot bed <> wheelchair Supervision and self propel throughout hall Mod I to ensure fit. Relayed to pt that this is 20x16 and is wider than one in room, but pt feels like it will be a good fit  at home. Demonstrated use of drop arm BSC as well and verbalized understanding. Pt feels ready to go home tomorrow. Pt sitting EOB with call bell in reach and wife present. Relayed to nursing staff how pt showers as pt wanted to shower later today or tomorrow and not with therapy this session.   Therapy Documentation Precautions:  Precautions Precautions: Fall Precaution Comments: wound vac on RLE Required Braces or Orthoses: Other Brace Other Brace: RLE limb protector Restrictions Weight Bearing Restrictions: Yes RLE Weight Bearing: Non weight bearing LLE Weight Bearing: Weight bearing as tolerated     Therapy/Group: Individual Therapy  Viona Gilmore 10/05/2021, 7:28 AM

## 2021-10-05 NOTE — Progress Notes (Signed)
Inpatient Rehabilitation Discharge Medication Review by a Pharmacist  A complete drug regimen review was completed for this patient to identify any potential clinically significant medication issues.  High Risk Drug Classes Is patient taking? Indication by Medication  Antipsychotic No   Anticoagulant No   Antibiotic No   Opioid Yes OxyIR- acute pain  Antiplatelet Yes Aspirin- CVA prophylaxis  Hypoglycemics/insulin Yes Semglee, metformin- T2DM  Vasoactive Medication No   Chemotherapy No   Other Yes Lipitor- HLD Celexa- anxiety/MDD Clonazepam- anxiety Gabapentin- neuropathic pain Protonix- GERD     Type of Medication Issue Identified Description of Issue Recommendation(s)  Drug Interaction(s) (clinically significant)     Duplicate Therapy     Allergy     No Medication Administration End Date     Incorrect Dose     Additional Drug Therapy Needed     Significant med changes from prior encounter (inform family/care partners about these prior to discharge).    Other       Clinically significant medication issues were identified that warrant physician communication and completion of prescribed/recommended actions by midnight of the next day:  No  Time spent performing this drug regimen review (minutes):  30   Caterin Tabares BS, PharmD, BCPS Clinical Pharmacist 10/05/2021 10:57 AM

## 2021-10-05 NOTE — Progress Notes (Signed)
Inpatient Rehabilitation Care Coordinator Discharge Note   Patient Details  Name: Cody Pena MRN: [id number] Date of Birth: 17-Apr-1961   Discharge location: HOME WITH WIFE WHO IS ABLE TO PROVIDE SUPERVISION AND LIGHT MIN ASSIST  Length of Stay: 6 DAYS   Discharge activity level: SUPERVISION WITH CUES  Home/community participation: ACTIVE  Patient response QQ:IWLNLG Literacy - How often do you need to have someone help you when you read instructions, pamphlets, or other written material from your doctor or pharmacy?: Never  Patient response XQ:JJHERD Isolation - How often do you feel lonely or isolated from those around you?: Rarely  Services provided included: MD, RD, PT, OT, RN, CM, Neuropsych, SW  Financial Services:  Field seismologist Utilized: Production designer, theatre/television/film  Choices offered to/list presented to: PT AND WIFE  Follow-up services arranged:  DME, Patient/Family has no preference for HH/DME agencies   HOME EXERCISE PROGRAM UNTIL CAN GET PROTHESIS-SAVE HIS VISITS.   DME : [facility]-WHEELCHAIR AND DROP-ARM BEDSIUDE COMMODE GET TUB BENCH ON OWN    Patient response to transportation need: Is the patient able to respond to transportation needs?: Yes In the past 12 months, has lack of transportation kept you from medical appointments or from getting medications?: No In the past 12 months, has lack of transportation kept you from meetings, work, or from getting things needed for daily living?: No    Comments (or additional information): WIFE STAYED HERE WITH HIM AND WAS INVOLVED IN HIS CARE. PT DID WELL AND REACHED GOALS QUICKLY. APPLYING FOR SSD AND WILL PURSUE ON OWN  Patient/Family verbalized understanding of follow-up arrangements:  Yes  Individual responsible for coordination of the follow-up plan: JANE-WIFE [phone]  Confirmed correct DME delivered: Lucy Chris 10/05/2021    Quantisha Marsicano, Lemar Livings

## 2021-10-05 NOTE — Progress Notes (Signed)
Occupational Therapy Session Note  Patient Details  Name: Cody Pena MRN: [id number] Date of Birth: 1961-04-18  Today's Date: 10/05/2021 OT Individual Time: 6943-7005 OT Individual Time Calculation (min): 44 min    Short Term Goals: Week 1:  OT Short Term Goal 1 (Week 1): STGs=LTGs due to ELOS  Skilled Therapeutic Interventions/Progress Updates:    Pt received side-lying in bed asleep, awakened to voice, agreeable to therapy. Session focus on self-care retraining, activity tolerance, transfer retraining, psychosocial health in prep for improved ADL/IADL/func mobility performance + decreased caregiver burden.  Bed mobility with mod I + use of bed rails/features PRN. Ind donned R limb guard and able to recall use of mirror therapy and desensitization techniques for phantom pain management. Squat-pivot to and from w/c with close S, pt ind able to manage w/c parts. Discussed DME and f/u recs; pt requesting 3in1 for home use but plans to ind purchase TTB. Reports he has RW and expresses preference to save therapy visits for when prosthetic is fitted. Self-propelled w/c to and from hospital [facility] with mod I. Pt taken outdoors for change of scenery/psychosocial health and pt appreciative.   Discussed daily routines/habits to promote healthy lifestyle/chronic disease management. Encouraged pt to discuss concerns re healthy diet, smoking cessation, and diabetes management with PCP/MD. Provided amputee support group flyer and pt appreciate with plans to attention.   Pt noted with "CBD oil" device on person, reports that he hasn't been using it but educated on no vape policy. RN updated.  Pt left seated EOB with family present, bed alarm engaged, call bell in reach, and all immediate needs met.    Therapy Documentation Precautions:  Precautions Precautions: Fall Precaution Comments: wound vac on RLE Required Braces or Orthoses: Other Brace Other Brace: RLE limb  protector Restrictions Weight Bearing Restrictions: Yes RLE Weight Bearing: Non weight bearing LLE Weight Bearing: Weight bearing as tolerated  Pain: Pain Assessment Pain Score: 0-No pain ADL: See Care Tool for more details.  Therapy/Group: Individual Therapy  Volanda Napoleon MS, OTR/L  10/05/2021, 6:54 AM

## 2021-10-06 LAB — GLUCOSE, CAPILLARY: Glucose-Capillary: 136 mg/dL — ABNORMAL HIGH (ref 70–99)

## 2021-10-06 NOTE — Progress Notes (Signed)
PROGRESS NOTE   Subjective/Complaints:  Pt reports doing well- asking about disability "short term"- explained usually through job- he says there's another kind through government- I explained I was unaware of any- likely out of work 6-12 months.   Pain tolerable- will need 7 days of pain meds to get to pain doctor.   ROS-  Pt denies SOB, abd pain, CP, N/V/C/D, and vision changes     Objective:   No results found. Recent Labs    10/04/21 0507  WBC 8.4  HGB 10.4*  HCT 32.2*  PLT 385    Recent Labs    10/04/21 0507  NA 137  K 4.2  CL 97*  CO2 30  GLUCOSE 142*  BUN 15  CREATININE 0.78  CALCIUM 8.7*    Intake/Output Summary (Last 24 hours) at 10/06/2021 0817 Last data filed at 10/06/2021 0700 Gross per 24 hour  Intake 320 ml  Output 1275 ml  Net -955 ml        Physical Exam: Vital Signs Blood pressure 110/62, pulse 64, temperature 97.8 F (36.6 C), resp. rate 18, height 6\' 4"  (1.93 m), weight 104.7 kg, SpO2 99 %.       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 Functional deficits which require 3+ hours per day of interdisciplinary therapy in a comprehensive inpatient rehab setting. Physiatrist is providing close team supervision and 24 hour  management of active medical problems listed below. Physiatrist and rehab team continue to assess barriers to discharge/monitor patient progress toward functional and medical goals  Care Tool:  Bathing  Bathing activity did not occur: Safety/medical concerns Body parts bathed by patient: Right arm, Left arm, Chest, Abdomen, Front perineal area, Buttocks, Right upper leg, Left upper leg, Face, Left lower leg     Body parts n/a: Right lower leg   Bathing assist Assist Level: Supervision/Verbal cueing     Upper Body Dressing/Undressing Upper body dressing   What is the patient wearing?: Pull over shirt    Upper body assist Assist Level: Set up assist    Lower Body Dressing/Undressing Lower body dressing      What is the patient wearing?: Pants, Underwear/pull up     Lower body assist Assist for lower body dressing: Supervision/Verbal cueing     Toileting Toileting    Toileting assist Assist for toileting: Supervision/Verbal cueing     Transfers Chair/bed transfer  Transfers assist     Chair/bed transfer assist level: Supervision/Verbal cueing     Locomotion Ambulation   Ambulation assist  Assist level: Supervision/Verbal cueing Assistive device: Walker-rolling Max distance: 140 ft   Walk 10 feet activity   Assist     Assist level: Supervision/Verbal cueing Assistive device: Walker-rolling   Walk 50 feet activity   Assist    Assist level: Supervision/Verbal cueing Assistive device: Walker-rolling    Walk 150 feet activity   Assist Walk 150 feet activity did not occur: Safety/medical concerns         Walk 10 feet on uneven surface  activity   Assist Walk 10 feet on uneven surfaces activity did not occur: Safety/medical concerns         Wheelchair     Assist Is the patient using a wheelchair?: Yes Type of Wheelchair: Manual    Wheelchair assist level: Supervision/Verbal cueing Max wheelchair distance: 150'     Wheelchair 50 feet with 2 turns activity    Assist        Assist Level: Supervision/Verbal cueing   Wheelchair 150 feet activity     Assist      Assist Level: Supervision/Verbal cueing   Blood pressure 110/62, pulse 64, temperature 97.8 F (36.6 C), resp. rate 18, height 6\' 4"  (1.93 m), weight 104.7 kg, SpO2 99 %.  Medical Problem List and Plan: 1. Functional deficits secondary to right BKA             -patient may not shower until wound VAC is discontinued- can shower after Vac removed today             -ELOS/Goals: 5-7  Con't CIR- PT and OT- pt educated to keep leg as straight as possible, esp when not wearing limb guard to prevent contracture- which would limit prosthesis use-   2/24- d/c today- went over that I haven't heard of Short term disability not through job, but see him out of work 6-12 months minimum. Maybe longer if wound doesn't heal well.  2.  Antithrombotics: -DVT/anticoagulation:  Pharmaceutical: Lovenox             -antiplatelet therapy: Aspirin 81 mg daily Plavix 75 mg daily for right SFA and right popliteal stents placed in December 2022 3. Pain Management: Oxycodone 10-15 mg every 4 hours.  Robaxin 500 mg p.o. every 6 hours as needed gabapentin 600 mg 3 times daily  2/20- usually on percocet 10 /325 mg q4 hours or so at home- pain OK  2/23- pain stable- con't regimen  2/24- will need 7 days of meds until gets back with pain doctor-  4. Mood: Celexa 20 mg/day, Klonopin 0.5 mg takes twice daily at home             -antipsychotic agents: Not applicable 5. Neuropsych: This patient is capable of making decisions on his own behalf. 6. Skin/Wound Care: Has wound VAC for right lower extremity, monitor for signs of skin breakdown left foot and buttocks region, use of sulfate 220 mg daily for 14 days  2/22- wrote for wound VAC ot be removed and dressing to be placed OVER srhinker per Dr Jess Barters protocol- also ordered 2 shrinkers.   2/23- pt has shrinkers-  VAC off- con't wound care 7. Fluids/Electrolytes/Nutrition: Carb modified diet monitor I's and O's, Ensure twice daily 8.  Constipation related to opioids use MiraLAX as needed 9.  GERD Protonix 40 mg/day  10.  Diabetes metformin 500 mg twice daily,  sliding scale moderate, Semglee 20 units subcu nightly (may need to restart Novalog mealtime 4U  TID)  CBG (last 3)  Recent Labs  10/05/21 1654 10/05/21 2204 10/06/21 0710  GLUCAP 199* 155* 136*   2/22- CBGs still more elevated, but less than 200- increased semglee yesterday- con't regimen  2/24- Pt wa son insulin at home- CBGs 130s-190s- will send home on current regimen.  11.  Mild hypoalbuminemia- cont prosource and ensure supplements 12.  Mild hyponatremia , may be IV related asymptomatic will monitor   2/20- Na 130- stable 13.  ABLA - has had 2 surgeries in last month hgb prior to TMA was 14 but fluctuating in 10-11 gm range since that time  14. Itching  2/20- started Benadryl 12.5 mg q6 hours prn for itching.   2/23- itching resolved now that VAC is off 15. Dispo  2/23- will need at least 1 f/u with me in clinic  I spent a total of 36   minutes on total care today- >50% coordination of care- due to d/w pt- wa son insulin at home- and knows how to do DM care; has new appt with new PCP- has to reschedule- and d/w pt about disability- works physical job- cannot work until walking with with prosthesis.    LOS: 6 days A FACE TO FACE EVALUATION WAS PERFORMED  Cody Pena 10/06/2021, 8:17 AM

## 2021-10-08 NOTE — Progress Notes (Signed)
Patient discharged to home with wife. Patient stated that they understood all discharge instructions.  [employer], LPN

## 2021-10-10 ENCOUNTER — Ambulatory Visit (INDEPENDENT_AMBULATORY_CARE_PROVIDER_SITE_OTHER): Payer: 59 | Admitting: Orthopedic Surgery

## 2021-10-10 ENCOUNTER — Encounter: Payer: Self-pay | Admitting: Orthopedic Surgery

## 2021-10-10 DIAGNOSIS — Z89511 Acquired absence of right leg below knee: Secondary | ICD-10-CM

## 2021-10-10 NOTE — Progress Notes (Signed)
Office Visit Note   Patient: Cody Pena           Date of Birth: 10/01/1960           MRN: [id number] Visit Date: 10/10/2021              Requested by: Tamsen Roers, [place],  [place] [id number] PCP: Tamsen Roers, MD  Chief Complaint  Patient presents with   Right Leg - Routine Post Op    09/17/21 right BKA Photos taken Wearing limb protector Wound vac taken off at hospital before discharge on day 5       HPI: Patient is a 61 year old gentleman who is 2 weeks status post right below-knee amputation he is currently in a 4 extra-large stump shrinker patient states he had the wound VAC on for 5 days.  Assessment & Plan: Visit Diagnoses:  1. S/P below knee amputation, right ([place])     Plan: Patient was given a prescription for a 3 XL shrinker plan to follow-up in 1 week to remove the staples.  Follow-Up Instructions: Return in about 1 week (around 10/17/2021).   Ortho Exam  Patient is alert, oriented, no adenopathy, well-dressed, normal affect, normal respiratory effort. Examination patient does have increased swelling in the right residual limb.  The posterior ulcer is essentially healed the surgical incision is healing well without cellulitis or drainage there is swelling.  Patient is a new right transtibial  amputee.  Patient's current comorbidities are not expected to impact the ability to function with the prescribed prosthesis. Patient verbally communicates a strong desire to use a prosthesis. Patient currently requires mobility aids to ambulate without a prosthesis.  Expects not to use mobility aids with a new prosthesis.  Patient is a K3 level ambulator that spends a lot of time walking around on uneven terrain over obstacles, up and down stairs, and ambulates with a variable cadence.     Imaging: No results found.      Labs: Lab Results  Component Value Date   HGBA1C 9.2 (H) 09/27/2021   HGBA1C 12.5 (H) 07/23/2021   HGBA1C 7.2 (H)  11/10/2017   ESRSEDRATE 60 (H) 11/10/2017   ESRSEDRATE 20 (H) 12/07/2014   CRP 3.2 (H) 11/10/2017   CRP 7.0 (H) 12/07/2014   REPTSTATUS 09/21/2021 FINAL 09/16/2021   GRAMSTAIN  07/23/2021    RARE WBC PRESENT, PREDOMINANTLY MONONUCLEAR FEW GRAM POSITIVE COCCI IN PAIRS AND CHAINS    GRAMSTAIN  07/23/2021    NO WBC SEEN MODERATE GRAM POSITIVE COCCI FEW GRAM POSITIVE RODS    CULT  09/16/2021    NO GROWTH 5 DAYS Performed at [facility], [place] [address] [place], [place] [address]      Lab Results  Component Value Date   ALBUMIN 2.5 (L) 09/30/2021   ALBUMIN 2.8 (L) 09/27/2021   ALBUMIN 2.5 (L) 09/17/2021   PREALBUMIN 11.9 (L) 09/27/2021   PREALBUMIN 12.9 (L) 09/18/2021    Lab Results  Component Value Date   MG 1.8 09/27/2021   MG 2.0 12/07/2014   Lab Results  Component Value Date   VD25OH 33.23 09/27/2021    Lab Results  Component Value Date   PREALBUMIN 11.9 (L) 09/27/2021   PREALBUMIN 12.9 (L) 09/18/2021   CBC EXTENDED Latest Ref Rng & Units 10/04/2021 09/29/2021 09/28/2021  WBC 4.0 - 10.5 K/uL 8.4 12.7(H) 14.6(H)  RBC 4.22 - 5.81 MIL/uL 3.41(L) 3.18(L) 3.35(L)  HGB 13.0 - 17.0 g/dL 10.4(L) 10.0(L)  10.5(L)  HCT 39.0 - 52.0 % 32.2(L) 30.0(L) 31.2(L)  PLT 150 - 400 K/uL 385 214 247  NEUTROABS 1.7 - 7.7 K/uL 4.2 - -  LYMPHSABS 0.7 - 4.0 K/uL 2.8 - -     There is no height or weight on file to calculate BMI.  Orders:  No orders of the defined types were placed in this encounter.  No orders of the defined types were placed in this encounter.    Procedures: No procedures performed  Clinical Data: No additional findings.  ROS:  All other systems negative, except as noted in the HPI. Review of Systems  Objective: Vital Signs: There were no vitals taken for this visit.  Specialty Comments:  No specialty comments available.  PMFS History: Patient Active Problem List   Diagnosis Date Noted   S/P BKA (below knee amputation), right ([place])  09/30/2021   Below-knee amputation of right lower extremity ([place]) 09/27/2021   Dehiscence of amputation stump of right lower extremity (HCC)    Wound infection after surgery 09/16/2021   Cellulitis of right lower extremity    Peripheral vascular disease ([place])    History of transmetatarsal amputation of right foot ([place]) 08/25/2021   Osteomyelitis ([place]) 07/24/2021   HTN (hypertension) 07/23/2021   Hyperlipidemia 07/23/2021   Diabetic foot ulcers ([place]) 07/23/2021   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 ([place]) 01/14/2018   Ulcer of left foot, limited to breakdown of skin ([place]) 01/14/2018   Foot osteomyelitis, left ([place])    Septic arthritis of foot ([place]) 11/11/2017   Osteomyelitis of ankle or foot, acute, left ([place]) 11/11/2017   Type 2 DM with diabetic peripheral angiopathy w/o gangrene ([place]) 11/11/2017   Cigarette smoker 11/11/2017   Diabetic peripheral neuropathy ([place]) 11/11/2017   Diabetic foot infection ([place]) 11/10/2017   Post op infection    Delirium 12/07/2014   Cervical vertebral fusion 12/07/2014   Hallucination 12/07/2014   Poorly controlled type 2 diabetes mellitus ([place]) 12/07/2014   Mood disorder ([place]) 12/07/2014   Pyrexia    Neck swelling    Past Medical History:  Diagnosis Date   COVID    has had Covid 3 times, not sick with it   Depression    Diabetes mellitus without complication ([place])    pt states he has now been told that he is a type 1 diabetic.   Hypertension    Peripheral vascular disease ([place])     History reviewed. No pertinent family history.  Past Surgical History:  Procedure Laterality Date   ABDOMINAL AORTOGRAM W/LOWER EXTREMITY N/A 08/11/2021   Procedure: ABDOMINAL AORTOGRAM W/LOWER EXTREMITY;  Surgeon: Cherre Robins, MD;  Location: [facility];  Service: Cardiovascular;  Laterality: N/A;   AMPUTATION Left 11/15/2017   Procedure: LEFT FOOT 2ND RAY AMPUTATION;  Surgeon: Newt Minion, MD;  Location: [facility];  Service:  Orthopedics;  Laterality: Left;   AMPUTATION Right 08/25/2021   Procedure: RIGHT TRANSMETATARSAL AMPUTATION;  Surgeon: Newt Minion, MD;  Location: [facility];  Service: Orthopedics;  Laterality: Right;   AMPUTATION Right 09/27/2021   Procedure: BELOW KNEE AMPUTATION;  Surgeon: Newt Minion, MD;  Location: [facility];  Service: Orthopedics;  Laterality: Right;   CERVICAL FUSION     IR ANGIOGRAM EXTREMITY LEFT  11/14/2017   IR ANGIOGRAM SELECTIVE EACH ADDITIONAL VESSEL  11/14/2017   IR ANGIOGRAM SELECTIVE EACH ADDITIONAL VESSEL  11/14/2017   IR TIB-PERO ART PTA MOD SED  11/14/2017   IR  TIB-PERO ART UNI PTA EA ADD VESSEL MOD SED  11/14/2017   IR US GUIDE VASC ACCESS LEFT  11/14/2017   PERIPHERAL VASCULAR INTERVENTION Right 08/11/2021   Procedure: PERIPHERAL VASCULAR INTERVENTION;  Surgeon: Cherre Robins, MD;  Location: [facility];  Service: Cardiovascular;  Laterality: Right;  rt sfa stent   Social History   Occupational History   Not on file  Tobacco Use   Smoking status: Every Day    Packs/day: 0.50    Years: 47.00    Pack years: 23.50    Types: Cigarettes    Last attempt to quit: 08/11/2021    Years since quitting: 0.1   Smokeless tobacco: Never  Vaping Use   Vaping Use: Some days  Substance and Sexual Activity   Alcohol use: No   Drug use: No   Sexual activity: Not on file

## 2021-10-16 ENCOUNTER — Encounter: Payer: 59 | Attending: Physical Medicine and Rehabilitation | Admitting: Physical Medicine and Rehabilitation

## 2021-10-17 ENCOUNTER — Ambulatory Visit (INDEPENDENT_AMBULATORY_CARE_PROVIDER_SITE_OTHER): Payer: 59 | Admitting: Orthopedic Surgery

## 2021-10-17 ENCOUNTER — Ambulatory Visit (INDEPENDENT_AMBULATORY_CARE_PROVIDER_SITE_OTHER): Payer: 59

## 2021-10-17 DIAGNOSIS — Z89511 Acquired absence of right leg below knee: Secondary | ICD-10-CM

## 2021-10-23 ENCOUNTER — Encounter: Payer: Self-pay | Admitting: Orthopedic Surgery

## 2021-10-23 NOTE — Progress Notes (Signed)
Morning ? ?Office Visit Note ?  ?Patient: Cody Pena           ?Date of Birth: 05/18/1961           ?MRN: [id number] ?Visit Date: 10/17/2021 ?             ?Requested by: Aida Puffer, MD ?[address] ?[place],  [place] [address] ?PCP: Aida Puffer, MD ? ?Chief Complaint  ?Patient presents with  ? Right Leg - Routine Post Op  ? ? ? ? ?HPI: ?Patient is a 61 year old gentleman who presents 2 weeks status post right transtibial amputation. ? ?Assessment & Plan: ?Visit Diagnoses:  ?1. S/P below knee amputation, right (HCC)   ? ? ?Plan: Continue with the compression sleeve follow-up in 1 week to harvest the staples.  A prescription was provided for [employer] for prosthesis. ? ?Follow-Up Instructions: Return in about 4 weeks (around 11/14/2021).  ? ?Ortho Exam ? ?Patient is alert, oriented, no adenopathy, well-dressed, normal affect, normal respiratory effort. ?Examination the incision is well approximated there is no cellulitis or drainage no signs of infection. ? ?Patient is a new right transtibial  amputee. ? ?Patient's current comorbidities are not expected to impact the ability to function with the prescribed prosthesis. ?Patient verbally communicates a strong desire to use a prosthesis. ?Patient currently requires mobility aids to ambulate without a prosthesis.  Expects not to use mobility aids with a new prosthesis. ? ?Patient is a K3 level ambulator that spends a lot of time walking around on uneven terrain over obstacles, up and down stairs, and ambulates with a variable cadence. ? ? ? ? ?Imaging: ?No results found. ? ? ? ? ?Labs: ?Lab Results  ?Component Value Date  ? HGBA1C 9.2 (H) 09/27/2021  ? HGBA1C 12.5 (H) 07/23/2021  ? HGBA1C 7.2 (H) 11/10/2017  ? ESRSEDRATE 60 (H) 11/10/2017  ? ESRSEDRATE 20 (H) 12/07/2014  ? CRP 3.2 (H) 11/10/2017  ? CRP 7.0 (H) 12/07/2014  ? REPTSTATUS 09/21/2021 FINAL 09/16/2021  ? GRAMSTAIN  07/23/2021  ?  RARE WBC PRESENT, PREDOMINANTLY MONONUCLEAR ?FEW GRAM POSITIVE COCCI IN PAIRS AND  CHAINS ?  ? GRAMSTAIN  07/23/2021  ?  NO WBC SEEN ?MODERATE GRAM POSITIVE COCCI ?FEW GRAM POSITIVE RODS ?  ? CULT  09/16/2021  ?  NO GROWTH 5 DAYS ?Performed at [facility], [address] [place], [place] [address] ?  ? ? ? ?Lab Results  ?Component Value Date  ? ALBUMIN 2.5 (L) 09/30/2021  ? ALBUMIN 2.8 (L) 09/27/2021  ? ALBUMIN 2.5 (L) 09/17/2021  ? PREALBUMIN 11.9 (L) 09/27/2021  ? PREALBUMIN 12.9 (L) 09/18/2021  ? ? ?Lab Results  ?Component Value Date  ? MG 1.8 09/27/2021  ? MG 2.0 12/07/2014  ? ?Lab Results  ?Component Value Date  ? VD25OH 33.23 09/27/2021  ? ? ?Lab Results  ?Component Value Date  ? PREALBUMIN 11.9 (L) 09/27/2021  ? PREALBUMIN 12.9 (L) 09/18/2021  ? ?CBC EXTENDED Latest Ref Rng & Units 10/04/2021 09/29/2021 09/28/2021  ?WBC 4.0 - 10.5 K/uL 8.4 12.7(H) 14.6(H)  ?RBC 4.22 - 5.81 MIL/uL 3.41(L) 3.18(L) 3.35(L)  ?HGB 13.0 - 17.0 g/dL 10.4(L) 10.0(L) 10.5(L)  ?HCT 39.0 - 52.0 % 32.2(L) 30.0(L) 31.2(L)  ?PLT 150 - 400 K/uL 385 214 247  ?NEUTROABS 1.7 - 7.7 K/uL 4.2 - -  ?LYMPHSABS 0.7 - 4.0 K/uL 2.8 - -  ? ? ? ?There is no height or weight on file to calculate BMI. ? ?Orders:  ?Orders  Placed This Encounter  ?Procedures  ? XR Tibia/Fibula Right  ? ?No orders of the defined types were placed in this encounter. ? ? ? Procedures: ?No procedures performed ? ?Clinical Data: ?No additional findings. ? ?ROS: ? ?All other systems negative, except as noted in the HPI. ?Review of Systems ? ?Objective: ?Vital Signs: There were no vitals taken for this visit. ? ?Specialty Comments:  ?No specialty comments available. ? ?PMFS History: ?Patient Active Problem List  ? Diagnosis Date Noted  ? S/P BKA (below knee amputation), right (HCC) 09/30/2021  ? Below-knee amputation of right lower extremity (HCC) 09/27/2021  ? Dehiscence of amputation stump of right lower extremity (HCC)   ? Wound infection after surgery 09/16/2021  ? Cellulitis of right lower extremity   ? Peripheral vascular disease (HCC)   ? History of  transmetatarsal amputation of right foot (HCC) 08/25/2021  ? Osteomyelitis (HCC) 07/24/2021  ? HTN (hypertension) 07/23/2021  ? Hyperlipidemia 07/23/2021  ? Diabetic foot ulcers (HCC) 07/23/2021  ? 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 Ulcer of left foot, limited to breakdown of skin (HCC) 01/14/2018  ? Foot osteomyelitis, left (HCC)   ? Septic arthritis of foot (HCC) 11/11/2017  ? Osteomyelitis of ankle or foot, acute, left (HCC) 11/11/2017  ? Type 2 DM with diabetic peripheral angiopathy w/o gangrene (HCC) 11/11/2017  ? Cigarette smoker 11/11/2017  ? Diabetic peripheral neuropathy (HCC) 11/11/2017  ? Diabetic foot infection (HCC) 11/10/2017  ? Post op infection   ? Delirium 12/07/2014  ? Cervical vertebral fusion 12/07/2014  ? Hallucination 12/07/2014  ? Poorly controlled type 2 diabetes mellitus (HCC) 12/07/2014  ? Mood disorder (HCC) 12/07/2014  ? Pyrexia   ? Neck swelling   ? ?Past Medical History:  ?Diagnosis Date  ? COVID   ? has had Covid 3 times, not sick with it  ? Depression   ? Diabetes mellitus without complication (HCC)   ? pt states he has now been told that he is a type 1 diabetic.  ? Hypertension   ? Peripheral vascular disease (HCC)   ?  ?History reviewed. No pertinent family history.  ?Past Surgical History:  ?Procedure Laterality Date  ? ABDOMINAL AORTOGRAM W/LOWER EXTREMITY N/A 08/11/2021  ? Procedure: ABDOMINAL AORTOGRAM W/LOWER EXTREMITY;  Surgeon: Leonie DouglasHawken, Thomas N, MD;  [facility];  Service: Cardiovascular;  Laterality: N/A;  ? AMPUTATION Left 11/15/2017  ? Procedure: LEFT FOOT 2ND RAY AMPUTATION;  Surgeon: Nadara Mustarduda, Jehan Ranganathan V, MD;  [facility];  Service: Orthopedics;  Laterality: Left;  ? AMPUTATION Right 08/25/2021  ? Procedure: RIGHT TRANSMETATARSAL AMPUTATION;  Surgeon: Nadara Mustarduda, Tenna Lacko V, MD;  [facility];  Service: Orthopedics;  Laterality: Right;  ? AMPUTATION Right 09/27/2021  ? Procedure: BELOW KNEE AMPUTATION;  Surgeon: Nadara Mustarduda,  Dayshon Roback V, MD;  [facility];  Service: Orthopedics;  Laterality: Right;  ? CERVICAL FUSION    ? IR ANGIOGRAM EXTREMITY LEFT  11/14/2017  ? IR ANGIOGRAM SELECTIVE EACH ADDITIONAL VESSEL  11/14/2017  ? IR ANGIOGRAM SELECTIVE EACH ADDITIONAL VESSEL  11/14/2017  ? IR TIB-PERO ART PTA MOD SED  11/14/2017  ? IR TIB-PERO ART UNI PTA EA ADD VESSEL MOD SED  11/14/2017  ? IR US GUIDE VASC ACCESS LEFT  11/14/2017  ? PERIPHERAL VASCULAR INTERVENTION Right 08/11/2021  ? Procedure: PERIPHERAL VASCULAR INTERVENTION;  Surgeon: Leonie DouglasHawken, Thomas N, MD;  [facility];  Service: Cardiovascular;  Laterality: Right;  rt sfa  stent  ? ?Social History  ? ?Occupational History  ? Not on file  ?Tobacco Use  ? Smoking status: Every Day  ?  Packs/day: 0.50  ?  Years: 47.00  ?  Pack years: 23.50  ?  Types: Cigarettes  ?  Last attempt to quit: 08/11/2021  ?  Years since quitting: 0.2  ? Smokeless tobacco: Never  ?Vaping Use  ? Vaping Use: Some days  ?Substance and Sexual Activity  ? Alcohol use: No  ? Drug use: No  ? Sexual activity: Not on file  ? ? ? ? ? ?

## 2021-10-31 ENCOUNTER — Encounter: Payer: 59 | Admitting: Family

## 2021-11-07 ENCOUNTER — Encounter: Payer: Self-pay | Admitting: Family

## 2021-11-07 ENCOUNTER — Ambulatory Visit (INDEPENDENT_AMBULATORY_CARE_PROVIDER_SITE_OTHER): Payer: 59 | Admitting: Family

## 2021-11-07 ENCOUNTER — Other Ambulatory Visit: Payer: Self-pay

## 2021-11-07 DIAGNOSIS — Z89511 Acquired absence of right leg below knee: Secondary | ICD-10-CM

## 2021-11-07 NOTE — Progress Notes (Signed)
? ?Post-Op Visit Note ?  ?Patient: Cody Pena           ?Date of Birth: [birth year]           ?MRN: [id number] ?Visit Date: 11/07/2021 ?PCP: Aida Puffer, MD ? ?Chief Complaint:  ?Chief Complaint  ?Patient presents with  ? Right Leg - Routine Post Op  ?  09/27/21 right BKA  ? ? ?HPI:  ?HPI ?The patient is a 61 year old gentleman seen status post right below-knee amputation February 15 unfortunately he has had about 4 falls since he was last seen he is working with Technical sales engineer for his prosthesis set up he states that he is going on April 15 to be casted he has been wearing his shrinker with direct skin contact ?Ortho Exam ?He has about 4 areas to his residual limb that have not yet healed these are covered with eschar the largest area is 1 cm in diameter these are dry there is no surrounding erythema no drainage overall incision is well-healed ? ?Visit Diagnoses: No diagnosis found. ? ?Plan: Continue daily Dial soap cleansing shrinker with direct skin contact he will continue his limb protector we will fax over an order for his prosthesis to [facility].  He will follow-up in the office in 1 month ? ?Follow-Up Instructions: No follow-ups on file.  ? ?Imaging: ?No results found. ? ?Orders:  ?No orders of the defined types were placed in this encounter. ? ?No orders of the defined types were placed in this encounter. ? ? ? ?PMFS History: ?Patient Active Problem List  ? Diagnosis Date Noted  ? S/P BKA (below knee amputation), right (HCC) 09/30/2021  ? Below-knee amputation of right lower extremity (HCC) 09/27/2021  ? Dehiscence of amputation stump of right lower extremity (HCC)   ? Wound infection after surgery 09/16/2021  ? Cellulitis of right lower extremity   ? Peripheral vascular disease (HCC)   ? History of transmetatarsal amputation of right foot (HCC) 08/25/2021  ? Osteomyelitis (HCC) 07/24/2021  ? HTN (hypertension) 07/23/2021  ? Hyperlipidemia 07/23/2021  ? Diabetic foot ulcers (HCC) 07/23/2021  ? 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 Ulcer of left foot, limited to breakdown of skin (HCC) 01/14/2018  ? Foot osteomyelitis, left (HCC)   ? Septic arthritis of foot (HCC) 11/11/2017  ? Osteomyelitis of ankle or foot, acute, left (HCC) 11/11/2017  ? Type 2 DM with diabetic peripheral angiopathy w/o gangrene (HCC) 11/11/2017  ? Cigarette smoker 11/11/2017  ? Diabetic peripheral neuropathy (HCC) 11/11/2017  ? Diabetic foot infection (HCC) 11/10/2017  ? Post op infection   ? Delirium 12/07/2014  ? Cervical vertebral fusion 12/07/2014  ? Hallucination 12/07/2014  ? Poorly controlled type 2 diabetes mellitus (HCC) 12/07/2014  ? Mood disorder (HCC) 12/07/2014  ? Pyrexia   ? Neck swelling   ? ?Past Medical History:  ?Diagnosis Date  ? COVID   ? has had Covid 3 times, not sick with it  ? Depression   ? Diabetes mellitus without complication (HCC)   ? pt states he has now been told that he is a type 1 diabetic.  ? Hypertension   ? Peripheral vascular disease (HCC)   ?  ?History reviewed. No pertinent family history.  ?Past Surgical History:  ?Procedure Laterality Date  ? ABDOMINAL AORTOGRAM W/LOWER EXTREMITY N/A 08/11/2021  ? Procedure: ABDOMINAL AORTOGRAM W/LOWER EXTREMITY;  Surgeon: Leonie Douglas, MD;  Location: [facility];  Service: Cardiovascular;  Laterality: N/A;  ? AMPUTATION Left 11/15/2017  ? Procedure: LEFT FOOT 2ND RAY AMPUTATION;  Surgeon: Nadara Mustard, MD;  Location: [facility] OR;  Service: Orthopedics;  Laterality: Left;  ? AMPUTATION Right 08/25/2021  ? Procedure: RIGHT TRANSMETATARSAL AMPUTATION;  Surgeon: Nadara Mustard, MD;  Location: [facility] OR;  Service: Orthopedics;  Laterality: Right;  ? AMPUTATION Right 09/27/2021  ? Procedure: BELOW KNEE AMPUTATION;  Surgeon: Nadara Mustard, MD;  Location: [facility] OR;  Service: Orthopedics;  Laterality: Right;  ? CERVICAL FUSION    ? IR ANGIOGRAM EXTREMITY LEFT  11/14/2017  ? IR ANGIOGRAM SELECTIVE EACH ADDITIONAL VESSEL  11/14/2017  ? IR  ANGIOGRAM SELECTIVE EACH ADDITIONAL VESSEL  11/14/2017  ? IR TIB-PERO ART PTA MOD SED  11/14/2017  ? IR TIB-PERO ART UNI PTA EA ADD VESSEL MOD SED  11/14/2017  ? IR US GUIDE VASC ACCESS LEFT  11/14/2017  ? PERIPHERAL VASCULAR INTERVENTION Right 08/11/2021  ? Procedure: PERIPHERAL VASCULAR INTERVENTION;  Surgeon: Leonie Douglas, MD;  Location: [facility];  Service: Cardiovascular;  Laterality: Right;  rt sfa stent  ? ?Social History  ? ?Occupational History  ? Not on file  ?Tobacco Use  ? Smoking status: Every Day  ?  Packs/day: 0.50  ?  Years: 47.00  ?  Pack years: 23.50  ?  Types: Cigarettes  ?  Last attempt to quit: 08/11/2021  ?  Years since quitting: 0.2  ? Smokeless tobacco: Never  ?Vaping Use  ? Vaping Use: Some days  ?Substance and Sexual Activity  ? Alcohol use: No  ? Drug use: No  ? Sexual activity: Not on file  ? ? ?

## 2021-12-04 ENCOUNTER — Ambulatory Visit (INDEPENDENT_AMBULATORY_CARE_PROVIDER_SITE_OTHER): Payer: 59

## 2021-12-04 ENCOUNTER — Ambulatory Visit (INDEPENDENT_AMBULATORY_CARE_PROVIDER_SITE_OTHER): Payer: 59 | Admitting: Orthopedic Surgery

## 2021-12-04 ENCOUNTER — Encounter: Payer: Self-pay | Admitting: Orthopedic Surgery

## 2021-12-04 DIAGNOSIS — Z89511 Acquired absence of right leg below knee: Secondary | ICD-10-CM

## 2021-12-04 MED ORDER — OXYCODONE-ACETAMINOPHEN 10-325 MG PO TABS: 1.0000 | ORAL_TABLET | ORAL | 0 refills | Status: DC | PRN

## 2021-12-04 NOTE — Progress Notes (Signed)
? ?Office Visit Note ?  ?Patient: Cody Pena           ?Date of Birth: [birth year]           ?MRN: [id number] ?Visit Date: 12/04/2021 ?             ?Requested by: Aida Puffer, MD ?[address] ?[place],  [place] [address] ?PCP: Aida Puffer, MD ? ?Chief Complaint  ?Patient presents with  ? Right Leg - Routine Post Op  ?  09/27/2021 right BKA   ? ? ? ? ?HPI: ?Patient is a 61 year old gentleman who is seen in follow-up for right transtibial amputation.  He is about 5 weeks out from surgery.  Currently washing his leg with soap and water and wearing a shrinker. ? ?Assessment & Plan: ?Visit Diagnoses:  ?1. S/P below knee amputation, right (HCC)   ? ? ?Plan: Patient will be cast for his prosthesis in 1 week follow-up in the office in 4 weeks.  Prescription provided for Percocet. ? ?Follow-Up Instructions: Return in about 4 weeks (around 01/01/2022).  ? ?Ortho Exam ? ?Patient is alert, oriented, no adenopathy, well-dressed, normal affect, normal respiratory effort. ?Examination there is no swelling and no drainage patient has a dry scab over the medial aspect of the right transtibial amputation that is 1 cm in diameter there is healthy tissue no exposed bone or tendon no tunneling no depth. ? ?Imaging: ?XR Tibia/Fibula Right ? ?Result Date: 12/04/2021 ?2 view radiographs of the right tibia shows interval consolidation of the syndesmosis.  ?No images are attached to the encounter. ? ?Labs: ?Lab Results  ?Component Value Date  ? HGBA1C 9.2 (H) 09/27/2021  ? HGBA1C 12.5 (H) 07/23/2021  ? HGBA1C 7.2 (H) 11/10/2017  ? ESRSEDRATE 60 (H) 11/10/2017  ? ESRSEDRATE 20 (H) 12/07/2014  ? CRP 3.2 (H) 11/10/2017  ? CRP 7.0 (H) 12/07/2014  ? REPTSTATUS 09/21/2021 FINAL 09/16/2021  ? GRAMSTAIN  07/23/2021  ?  RARE WBC PRESENT, PREDOMINANTLY MONONUCLEAR ?FEW GRAM POSITIVE COCCI IN PAIRS AND CHAINS ?  ? GRAMSTAIN  07/23/2021  ?  NO WBC SEEN ?MODERATE GRAM POSITIVE COCCI ?FEW GRAM POSITIVE RODS ?  ? CULT  09/16/2021  ?  NO GROWTH 5  DAYS ?Performed at [facility], [address], [place], [place] [address] ?  ? ? ? ?Lab Results  ?Component Value Date  ? ALBUMIN 2.5 (L) 09/30/2021  ? ALBUMIN 2.8 (L) 09/27/2021  ? ALBUMIN 2.5 (L) 09/17/2021  ? PREALBUMIN 11.9 (L) 09/27/2021  ? PREALBUMIN 12.9 (L) 09/18/2021  ? ? ?Lab Results  ?Component Value Date  ? MG 1.8 09/27/2021  ? MG 2.0 12/07/2014  ? ?Lab Results  ?Component Value Date  ? VD25OH 33.23 09/27/2021  ? ? ?Lab Results  ?Component Value Date  ? PREALBUMIN 11.9 (L) 09/27/2021  ? PREALBUMIN 12.9 (L) 09/18/2021  ? ? ?  Latest Ref Rng & Units 10/04/2021  ?  5:07 AM 09/29/2021  ?  1:55 AM 09/28/2021  ?  3:29 AM  ?CBC EXTENDED  ?WBC 4.0 - 10.5 K/uL 8.4   12.7   14.6    ?RBC 4.22 - 5.81 MIL/uL 3.41   3.18   3.35    ?Hemoglobin 13.0 - 17.0 g/dL 48.1   85.6   31.4    ?HCT 39.0 - 52.0 % 32.2   30.0   31.2    ?Platelets 150 - 400 K/uL 385   214   247    ?NEUT# 1.7 -  7.7 K/uL 4.2      ?Lymph# 0.7 - 4.0 K/uL 2.8      ? ? ? ?There is no height or weight on file to calculate BMI. ? ?Orders:  ?Orders Placed This Encounter  ?Procedures  ? XR Tibia/Fibula Right  ? ?Meds ordered this encounter  ?Medications  ? oxyCODONE-acetaminophen (PERCOCET) 10-325 MG tablet  ?  Sig: Take 1 tablet by mouth every 4 (four) hours as needed for pain.  ?  Dispense:  30 tablet  ?  Refill:  0  ? ? ? Procedures: ?No procedures performed ? ?Clinical Data: ?No additional findings. ? ?ROS: ? ?All other systems negative, except as noted in the HPI. ?Review of Systems ? ?Objective: ?Vital Signs: There were no vitals taken for this visit. ? ?Specialty Comments:  ?No specialty comments available. ? ?PMFS History: ?Patient Active Problem List  ? Diagnosis Date Noted  ? S/P BKA (below knee amputation), right (HCC) 09/30/2021  ? Below-knee amputation of right lower extremity (HCC) 09/27/2021  ? Dehiscence of amputation stump of right lower extremity (HCC)   ? Wound infection after surgery 09/16/2021  ? Cellulitis of right lower extremity    ? Peripheral vascular disease (HCC)   ? History of transmetatarsal amputation of right foot (HCC) 08/25/2021  ? Osteomyelitis (HCC) 07/24/2021  ? HTN (hypertension) 07/23/2021  ? Hyperlipidemia 07/23/2021  ? Diabetic foot ulcers (HCC) 07/23/2021  ? 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 Ulcer of left foot, limited to breakdown of skin (HCC) 01/14/2018  ? Foot osteomyelitis, left (HCC)   ? Septic arthritis of foot (HCC) 11/11/2017  ? Osteomyelitis of ankle or foot, acute, left (HCC) 11/11/2017  ? Type 2 DM with diabetic peripheral angiopathy w/o gangrene (HCC) 11/11/2017  ? Cigarette smoker 11/11/2017  ? Diabetic peripheral neuropathy (HCC) 11/11/2017  ? Diabetic foot infection (HCC) 11/10/2017  ? Post op infection   ? Delirium 12/07/2014  ? Cervical vertebral fusion 12/07/2014  ? Hallucination 12/07/2014  ? Poorly controlled type 2 diabetes mellitus (HCC) 12/07/2014  ? Mood disorder (HCC) 12/07/2014  ? Pyrexia   ? Neck swelling   ? ?Past Medical History:  ?Diagnosis Date  ? COVID   ? has had Covid 3 times, not sick with it  ? Depression   ? Diabetes mellitus without complication (HCC)   ? pt states he has now been told that he is a type 1 diabetic.  ? Hypertension   ? Peripheral vascular disease (HCC)   ?  ?History reviewed. No pertinent family history.  ?Past Surgical History:  ?Procedure Laterality Date  ? ABDOMINAL AORTOGRAM W/LOWER EXTREMITY N/A 08/11/2021  ? Procedure: ABDOMINAL AORTOGRAM W/LOWER EXTREMITY;  Surgeon: Leonie DouglasHawken, Thomas N, MD;  Location: [facility];  Service: Cardiovascular;  Laterality: N/A;  ? AMPUTATION Left 11/15/2017  ? Procedure: LEFT FOOT 2ND RAY AMPUTATION;  Surgeon: Nadara Mustarduda, Debby Clyne V, MD;  [facility];  Service: Orthopedics;  Laterality: Left;  ? AMPUTATION Right 08/25/2021  ? Procedure: RIGHT TRANSMETATARSAL AMPUTATION;  Surgeon: Nadara Mustarduda, Tobechukwu Emmick V, MD;  [facility];  Service: Orthopedics;  Laterality: Right;  ? AMPUTATION Right 09/27/2021   ? Procedure: BELOW KNEE AMPUTATION;  Surgeon: Nadara Mustarduda, Zaquan Duffner V, MD;  [facility];  Service: Orthopedics;  Laterality: Right;  ? CERVICAL FUSION    ? IR ANGIOGRAM EXTREMITY LEFT  11/14/2017  ? IR ANGIOGRAM SELECTIVE EACH ADDITIONAL VESSEL  11/14/2017  ? IR ANGIOGRAM SELECTIVE EACH ADDITIONAL VESSEL  11/14/2017  ? IR TIB-PERO ART PTA MOD SED  11/14/2017  ? IR TIB-PERO ART UNI PTA EA ADD VESSEL MOD SED  11/14/2017  ? IR US GUIDE VASC ACCESS LEFT  11/14/2017  ? PERIPHERAL VASCULAR INTERVENTION Right 08/11/2021  ? Procedure: PERIPHERAL VASCULAR INTERVENTION;  Surgeon: Leonie Douglas, MD;  Location: [facility];  Service: Cardiovascular;  Laterality: Right;  rt sfa stent  ? ?Social History  ? ?Occupational History  ? Not on file  ?Tobacco Use  ? Smoking status: Every Day  ?  Packs/day: 0.50  ?  Years: 47.00  ?  Pack years: 23.50  ?  Types: Cigarettes  ?  Last attempt to quit: 08/11/2021  ?  Years since quitting: 0.3  ? Smokeless tobacco: Never  ?Vaping Use  ? Vaping Use: Some days  ?Substance and Sexual Activity  ? Alcohol use: No  ? Drug use: No  ? Sexual activity: Not on file  ? ? ? ? ? ?

## 2021-12-25 ENCOUNTER — Other Ambulatory Visit: Payer: Self-pay

## 2021-12-25 ENCOUNTER — Encounter (HOSPITAL_COMMUNITY): Payer: Self-pay | Admitting: Emergency Medicine

## 2021-12-25 ENCOUNTER — Inpatient Hospital Stay (HOSPITAL_COMMUNITY)
Admission: EM | Admit: 2021-12-25 | Discharge: 2021-12-29 | DRG: 574 | Disposition: A | Discharge status: Home / Self Care | Payer: 59 | Attending: Internal Medicine | Admitting: Internal Medicine

## 2021-12-25 DIAGNOSIS — T63301A Toxic effect of unspecified spider venom, accidental (unintentional), initial encounter: Secondary | ICD-10-CM | POA: Diagnosis present

## 2021-12-25 DIAGNOSIS — Z89511 Acquired absence of right leg below knee: Secondary | ICD-10-CM

## 2021-12-25 DIAGNOSIS — F32A Depression, unspecified: Secondary | ICD-10-CM | POA: Diagnosis present

## 2021-12-25 DIAGNOSIS — L02416 Cutaneous abscess of left lower limb: Secondary | ICD-10-CM | POA: Diagnosis not present

## 2021-12-25 DIAGNOSIS — B9562 Methicillin resistant Staphylococcus aureus infection as the cause of diseases classified elsewhere: Secondary | ICD-10-CM | POA: Diagnosis present

## 2021-12-25 DIAGNOSIS — Z794 Long term (current) use of insulin: Secondary | ICD-10-CM

## 2021-12-25 DIAGNOSIS — Z881 Allergy status to other antibiotic agents status: Secondary | ICD-10-CM

## 2021-12-25 DIAGNOSIS — E1151 Type 2 diabetes mellitus with diabetic peripheral angiopathy without gangrene: Secondary | ICD-10-CM | POA: Diagnosis present

## 2021-12-25 DIAGNOSIS — Z6829 Body mass index (BMI) 29.0-29.9, adult: Secondary | ICD-10-CM

## 2021-12-25 DIAGNOSIS — L03116 Cellulitis of left lower limb: Principal | ICD-10-CM

## 2021-12-25 DIAGNOSIS — L02415 Cutaneous abscess of right lower limb: Secondary | ICD-10-CM

## 2021-12-25 DIAGNOSIS — Z7982 Long term (current) use of aspirin: Secondary | ICD-10-CM

## 2021-12-25 DIAGNOSIS — Z20822 Contact with and (suspected) exposure to covid-19: Secondary | ICD-10-CM | POA: Diagnosis present

## 2021-12-25 DIAGNOSIS — E440 Moderate protein-calorie malnutrition: Secondary | ICD-10-CM | POA: Diagnosis present

## 2021-12-25 DIAGNOSIS — L089 Local infection of the skin and subcutaneous tissue, unspecified: Secondary | ICD-10-CM | POA: Diagnosis present

## 2021-12-25 DIAGNOSIS — I739 Peripheral vascular disease, unspecified: Secondary | ICD-10-CM | POA: Diagnosis present

## 2021-12-25 DIAGNOSIS — I10 Essential (primary) hypertension: Secondary | ICD-10-CM | POA: Diagnosis present

## 2021-12-25 DIAGNOSIS — J449 Chronic obstructive pulmonary disease, unspecified: Secondary | ICD-10-CM | POA: Diagnosis present

## 2021-12-25 DIAGNOSIS — D696 Thrombocytopenia, unspecified: Secondary | ICD-10-CM | POA: Diagnosis not present

## 2021-12-25 DIAGNOSIS — Z7902 Long term (current) use of antithrombotics/antiplatelets: Secondary | ICD-10-CM

## 2021-12-25 DIAGNOSIS — Z981 Arthrodesis status: Secondary | ICD-10-CM

## 2021-12-25 DIAGNOSIS — Z8616 Personal history of COVID-19: Secondary | ICD-10-CM

## 2021-12-25 DIAGNOSIS — L02414 Cutaneous abscess of left upper limb: Secondary | ICD-10-CM | POA: Diagnosis present

## 2021-12-25 DIAGNOSIS — R683 Clubbing of fingers: Secondary | ICD-10-CM | POA: Diagnosis present

## 2021-12-25 DIAGNOSIS — Z79899 Other long term (current) drug therapy: Secondary | ICD-10-CM

## 2021-12-25 DIAGNOSIS — Z7984 Long term (current) use of oral hypoglycemic drugs: Secondary | ICD-10-CM

## 2021-12-25 DIAGNOSIS — Z89422 Acquired absence of other left toe(s): Secondary | ICD-10-CM

## 2021-12-25 DIAGNOSIS — L738 Other specified follicular disorders: Secondary | ICD-10-CM | POA: Diagnosis present

## 2021-12-25 DIAGNOSIS — F419 Anxiety disorder, unspecified: Secondary | ICD-10-CM | POA: Diagnosis present

## 2021-12-25 DIAGNOSIS — F17210 Nicotine dependence, cigarettes, uncomplicated: Secondary | ICD-10-CM | POA: Diagnosis present

## 2021-12-25 DIAGNOSIS — E1051 Type 1 diabetes mellitus with diabetic peripheral angiopathy without gangrene: Secondary | ICD-10-CM | POA: Diagnosis present

## 2021-12-25 DIAGNOSIS — Z716 Tobacco abuse counseling: Secondary | ICD-10-CM

## 2021-12-25 DIAGNOSIS — F39 Unspecified mood [affective] disorder: Secondary | ICD-10-CM | POA: Diagnosis present

## 2021-12-25 DIAGNOSIS — E8809 Other disorders of plasma-protein metabolism, not elsewhere classified: Secondary | ICD-10-CM | POA: Diagnosis not present

## 2021-12-25 DIAGNOSIS — Z89611 Acquired absence of right leg above knee: Secondary | ICD-10-CM

## 2021-12-25 LAB — CBG MONITORING, ED: Glucose-Capillary: 305 mg/dL — ABNORMAL HIGH (ref 70–99)

## 2021-12-25 LAB — CBC WITH DIFFERENTIAL/PLATELET
Abs Immature Granulocytes: 0.06 10*3/uL (ref 0.00–0.07)
Basophils Absolute: 0.1 10*3/uL (ref 0.0–0.1)
Basophils Relative: 1 %
Eosinophils Absolute: 0.4 10*3/uL (ref 0.0–0.5)
Eosinophils Relative: 3 %
HCT: 48.3 % (ref 39.0–52.0)
Hemoglobin: 15.9 g/dL (ref 13.0–17.0)
Immature Granulocytes: 1 %
Lymphocytes Relative: 16 %
Lymphs Abs: 2.2 10*3/uL (ref 0.7–4.0)
MCH: 31.7 pg (ref 26.0–34.0)
MCHC: 32.9 g/dL (ref 30.0–36.0)
MCV: 96.4 fL (ref 80.0–100.0)
Monocytes Absolute: 0.8 10*3/uL (ref 0.1–1.0)
Monocytes Relative: 6 %
Neutro Abs: 9.8 10*3/uL — ABNORMAL HIGH (ref 1.7–7.7)
Neutrophils Relative %: 73 %
Platelets: 150 10*3/uL (ref 150–400)
RBC: 5.01 MIL/uL (ref 4.22–5.81)
RDW: 13.7 % (ref 11.5–15.5)
WBC: 13.3 10*3/uL — ABNORMAL HIGH (ref 4.0–10.5)
nRBC: 0 % (ref 0.0–0.2)

## 2021-12-25 LAB — COMPREHENSIVE METABOLIC PANEL
ALT: 17 U/L (ref 0–44)
AST: 14 U/L — ABNORMAL LOW (ref 15–41)
Albumin: 3.2 g/dL — ABNORMAL LOW (ref 3.5–5.0)
Alkaline Phosphatase: 116 U/L (ref 38–126)
Anion gap: 8 (ref 5–15)
BUN: 6 mg/dL — ABNORMAL LOW (ref 8–23)
CO2: 28 mmol/L (ref 22–32)
Calcium: 8.5 mg/dL — ABNORMAL LOW (ref 8.9–10.3)
Chloride: 102 mmol/L (ref 98–111)
Creatinine, Ser: 0.96 mg/dL (ref 0.61–1.24)
GFR, Estimated: >60 mL/min (ref >60)
Glucose, Bld: 286 mg/dL — ABNORMAL HIGH (ref 70–99)
Potassium: 3.6 mmol/L (ref 3.5–5.1)
Sodium: 138 mmol/L (ref 135–145)
Total Bilirubin: 0.6 mg/dL (ref 0.3–1.2)
Total Protein: 6.1 g/dL — ABNORMAL LOW (ref 6.5–8.1)

## 2021-12-25 LAB — LACTIC ACID, PLASMA: Lactic Acid, Venous: 1.8 mmol/L (ref 0.5–1.9)

## 2021-12-25 LAB — URINALYSIS, ROUTINE W REFLEX MICROSCOPIC
Bacteria, UA: NONE SEEN
Bilirubin Urine: NEGATIVE
Glucose, UA: >=500 mg/dL — AB
Hgb urine dipstick: NEGATIVE
Ketones, ur: NEGATIVE mg/dL
Leukocytes,Ua: NEGATIVE
Nitrite: NEGATIVE
Protein, ur: NEGATIVE mg/dL
Specific Gravity, Urine: 1.015 (ref 1.005–1.030)
pH: 6 (ref 5.0–8.0)

## 2021-12-25 MED ORDER — OXYCODONE-ACETAMINOPHEN 5-325 MG PO TABS
2.0000 | ORAL_TABLET | Freq: Once | ORAL | Status: AC
  Administered 2021-12-25: 2 via ORAL
  Filled 2021-12-25: qty 2

## 2021-12-25 NOTE — ED Provider Triage Note (Signed)
Emergency Medicine Provider Triage Evaluation Note ? ?Cody Pena , a 61 y.o. male  was evaluated in triage.  Pt complains of spider bite to left medial knee and left forearm.  States he started with the forearm, that 1 got better after he "popped it" on his own.  Secondary area popped up on his knee, that has become increasingly more painful and redness seems to be spreading.  He denies any fever but reports increased pain.  He is diabetic, states sugars have been a little "crazy" since this started. ? ?Review of Systems  ?Positive: Spider bite? ?Negative: fever ? ?Physical Exam  ?BP (!) 142/76 (BP Location: Left Arm)   Pulse 89   Temp 98.1 ?F (36.7 ?C) (Oral)   Resp 15   SpO2 99%  ?Gen:   Awake, no distress   ?Resp:  Normal effort  ?MSK:   Moves extremities without difficulty  ?Other:  Left medial knee with erythema, warmth discharge, and findings of cellulitis, there is central pustule with milky drainage, surrounding eschar; secondary area on left forearm ? ? ? ? ? ? ?Medical Decision Making  ?Medically screening exam initiated at 10:31 PM.  Appropriate orders placed.  Cody Pena was informed that the remainder of the evaluation will be completed by another provider, this initial triage assessment does not replace that evaluation, and the importance of remaining in the ED until their evaluation is complete. ? ?Wound infection.  Worse in left leg.  He is already s/p right AKA from prior wound/osteomyelitis.  CBG in triage 305.  Will check labs, lactate, cultures. ?  ?Larene Pickett, PA-C ?12/25/21 2301 ? ?

## 2021-12-25 NOTE — ED Triage Notes (Signed)
Pt reported to ED with c/o wound to inner portion of left lower thigh near knee. Pt states that bite occurred two days ago.  ?

## 2021-12-26 ENCOUNTER — Observation Stay (HOSPITAL_COMMUNITY): Payer: 59

## 2021-12-26 ENCOUNTER — Emergency Department (HOSPITAL_COMMUNITY): Payer: 59

## 2021-12-26 DIAGNOSIS — R683 Clubbing of fingers: Secondary | ICD-10-CM | POA: Diagnosis present

## 2021-12-26 DIAGNOSIS — Z794 Long term (current) use of insulin: Secondary | ICD-10-CM | POA: Diagnosis not present

## 2021-12-26 DIAGNOSIS — L02416 Cutaneous abscess of left lower limb: Secondary | ICD-10-CM | POA: Diagnosis present

## 2021-12-26 DIAGNOSIS — E8809 Other disorders of plasma-protein metabolism, not elsewhere classified: Secondary | ICD-10-CM | POA: Diagnosis not present

## 2021-12-26 DIAGNOSIS — Z89611 Acquired absence of right leg above knee: Secondary | ICD-10-CM | POA: Diagnosis not present

## 2021-12-26 DIAGNOSIS — Z8616 Personal history of COVID-19: Secondary | ICD-10-CM | POA: Diagnosis not present

## 2021-12-26 DIAGNOSIS — L738 Other specified follicular disorders: Secondary | ICD-10-CM | POA: Diagnosis present

## 2021-12-26 DIAGNOSIS — F419 Anxiety disorder, unspecified: Secondary | ICD-10-CM | POA: Diagnosis present

## 2021-12-26 DIAGNOSIS — Z7902 Long term (current) use of antithrombotics/antiplatelets: Secondary | ICD-10-CM | POA: Diagnosis not present

## 2021-12-26 DIAGNOSIS — Z20822 Contact with and (suspected) exposure to covid-19: Secondary | ICD-10-CM | POA: Diagnosis present

## 2021-12-26 DIAGNOSIS — B9562 Methicillin resistant Staphylococcus aureus infection as the cause of diseases classified elsewhere: Secondary | ICD-10-CM | POA: Diagnosis present

## 2021-12-26 DIAGNOSIS — Z89511 Acquired absence of right leg below knee: Secondary | ICD-10-CM | POA: Diagnosis not present

## 2021-12-26 DIAGNOSIS — T148XXA Other injury of unspecified body region, initial encounter: Secondary | ICD-10-CM | POA: Diagnosis not present

## 2021-12-26 DIAGNOSIS — Z6829 Body mass index (BMI) 29.0-29.9, adult: Secondary | ICD-10-CM | POA: Diagnosis not present

## 2021-12-26 DIAGNOSIS — Z7984 Long term (current) use of oral hypoglycemic drugs: Secondary | ICD-10-CM | POA: Diagnosis not present

## 2021-12-26 DIAGNOSIS — E1051 Type 1 diabetes mellitus with diabetic peripheral angiopathy without gangrene: Secondary | ICD-10-CM | POA: Diagnosis present

## 2021-12-26 DIAGNOSIS — J449 Chronic obstructive pulmonary disease, unspecified: Secondary | ICD-10-CM | POA: Diagnosis present

## 2021-12-26 DIAGNOSIS — F32A Depression, unspecified: Secondary | ICD-10-CM | POA: Diagnosis present

## 2021-12-26 DIAGNOSIS — L02414 Cutaneous abscess of left upper limb: Secondary | ICD-10-CM | POA: Diagnosis present

## 2021-12-26 DIAGNOSIS — Z79899 Other long term (current) drug therapy: Secondary | ICD-10-CM | POA: Diagnosis not present

## 2021-12-26 DIAGNOSIS — D696 Thrombocytopenia, unspecified: Secondary | ICD-10-CM | POA: Diagnosis not present

## 2021-12-26 DIAGNOSIS — L03116 Cellulitis of left lower limb: Secondary | ICD-10-CM | POA: Diagnosis present

## 2021-12-26 DIAGNOSIS — E440 Moderate protein-calorie malnutrition: Secondary | ICD-10-CM | POA: Diagnosis present

## 2021-12-26 DIAGNOSIS — L089 Local infection of the skin and subcutaneous tissue, unspecified: Secondary | ICD-10-CM | POA: Diagnosis not present

## 2021-12-26 DIAGNOSIS — I10 Essential (primary) hypertension: Secondary | ICD-10-CM | POA: Diagnosis present

## 2021-12-26 DIAGNOSIS — F17210 Nicotine dependence, cigarettes, uncomplicated: Secondary | ICD-10-CM | POA: Diagnosis present

## 2021-12-26 DIAGNOSIS — Z7982 Long term (current) use of aspirin: Secondary | ICD-10-CM | POA: Diagnosis not present

## 2021-12-26 DIAGNOSIS — F39 Unspecified mood [affective] disorder: Secondary | ICD-10-CM | POA: Diagnosis not present

## 2021-12-26 DIAGNOSIS — E1151 Type 2 diabetes mellitus with diabetic peripheral angiopathy without gangrene: Secondary | ICD-10-CM | POA: Diagnosis not present

## 2021-12-26 LAB — RESP PANEL BY RT-PCR (FLU A&B, COVID) ARPGX2
Influenza A by PCR: NEGATIVE
Influenza B by PCR: NEGATIVE
SARS Coronavirus 2 by RT PCR: NEGATIVE

## 2021-12-26 LAB — LACTIC ACID, PLASMA: Lactic Acid, Venous: 1.1 mmol/L (ref 0.5–1.9)

## 2021-12-26 LAB — C-REACTIVE PROTEIN: CRP: 4.4 mg/dL — ABNORMAL HIGH (ref <1.0)

## 2021-12-26 LAB — GLUCOSE, CAPILLARY
Glucose-Capillary: 217 mg/dL — ABNORMAL HIGH (ref 70–99)
Glucose-Capillary: 188 mg/dL — ABNORMAL HIGH (ref 70–99)

## 2021-12-26 LAB — CBG MONITORING, ED: Glucose-Capillary: 146 mg/dL — ABNORMAL HIGH (ref 70–99)

## 2021-12-26 LAB — SEDIMENTATION RATE: Sed Rate: 22 mm/hr — ABNORMAL HIGH (ref 0–16)

## 2021-12-26 LAB — SURGICAL PCR SCREEN
MRSA, PCR: NEGATIVE
Staphylococcus aureus: NEGATIVE

## 2021-12-26 LAB — PREALBUMIN: Prealbumin: 17.8 mg/dL — ABNORMAL LOW (ref 18–38)

## 2021-12-26 IMAGING — DX DG KNEE COMPLETE 4+V*L*
4 series · 4 of 4 positions shown · non-contrast
Comparison: No prior knee series.

CLINICAL DATA: 60-year-old male with infection. History of critical
limb ischemia left foot status post revascularization in [7Z].

EXAM:
LEFT KNEE - COMPLETE 4+ VIEW

[knee ap]
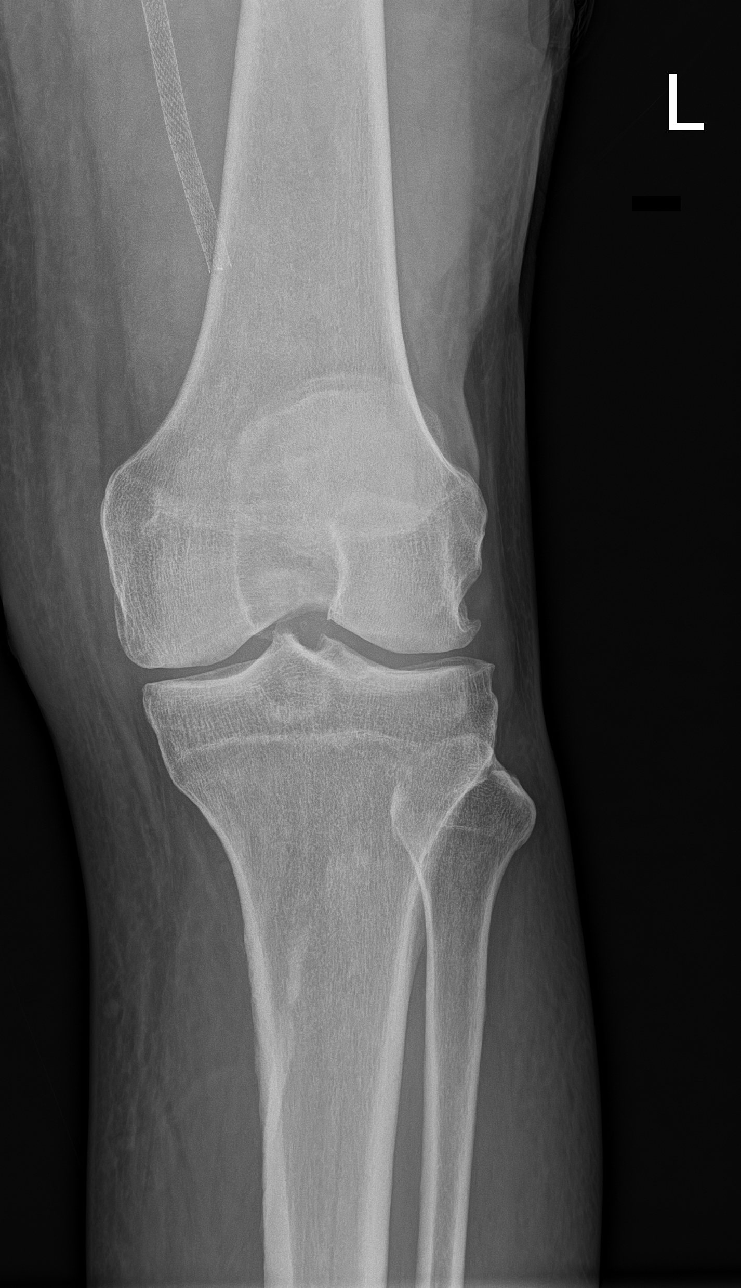

[knee lat]
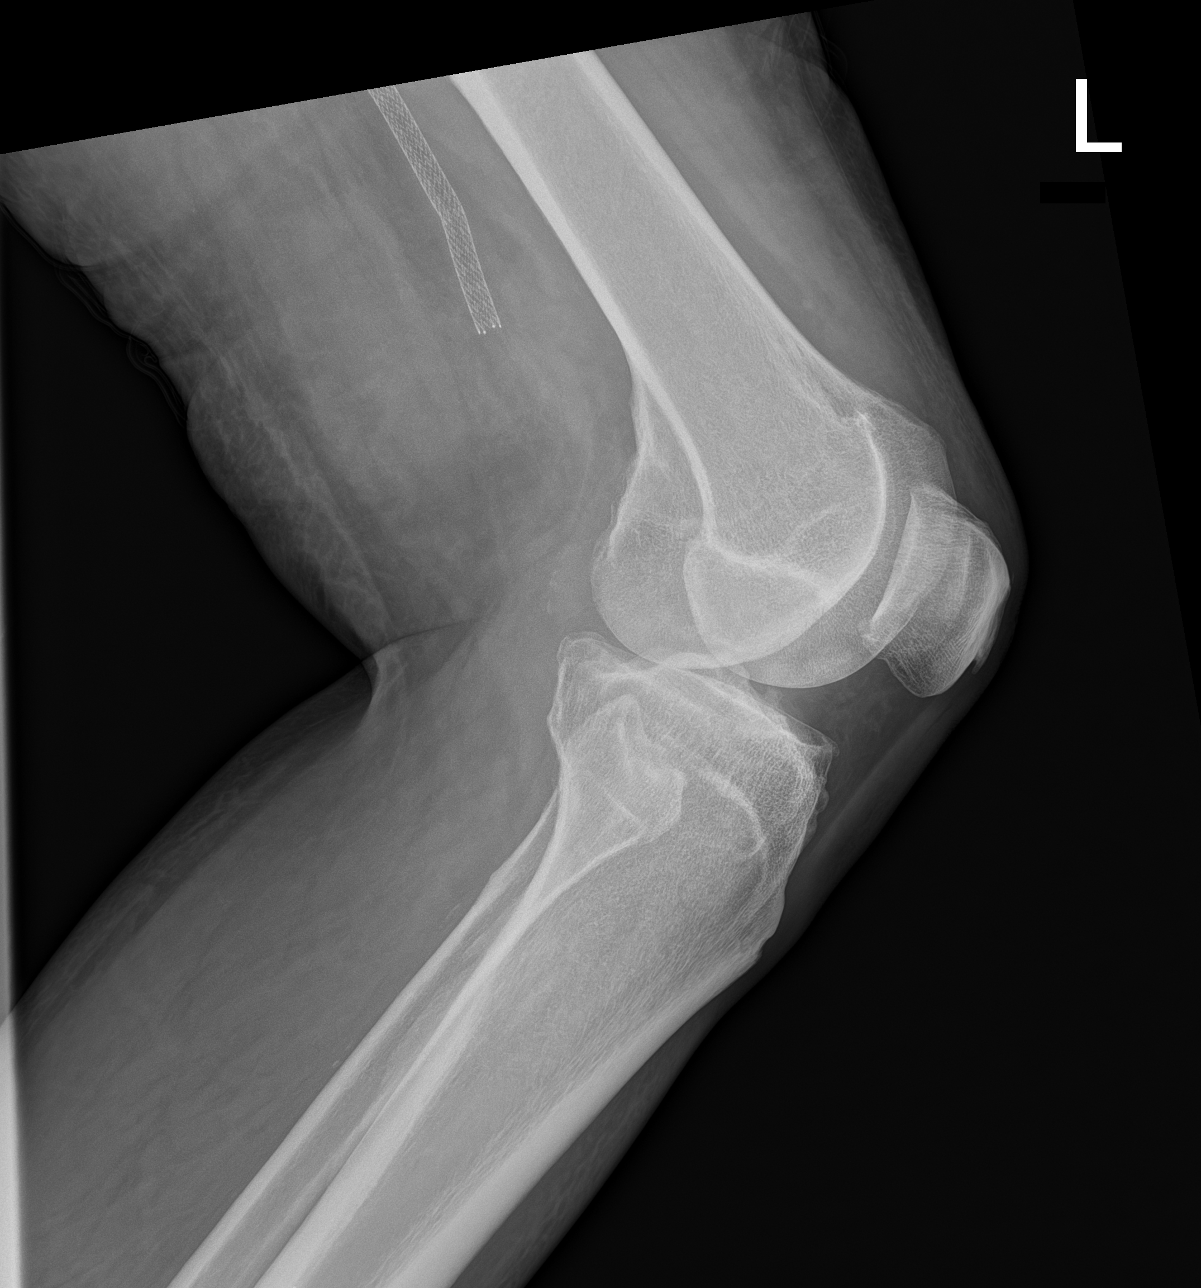

[knee obl (1 of 2)]
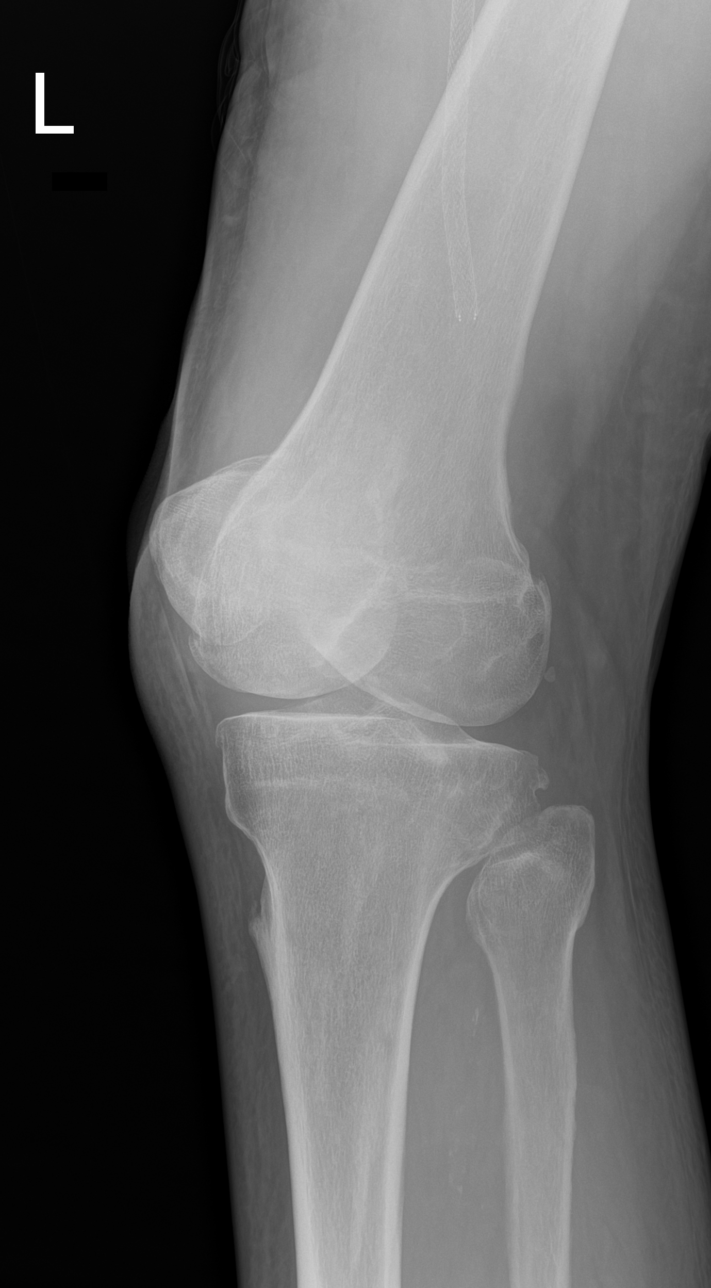

[knee obl (2 of 2)]
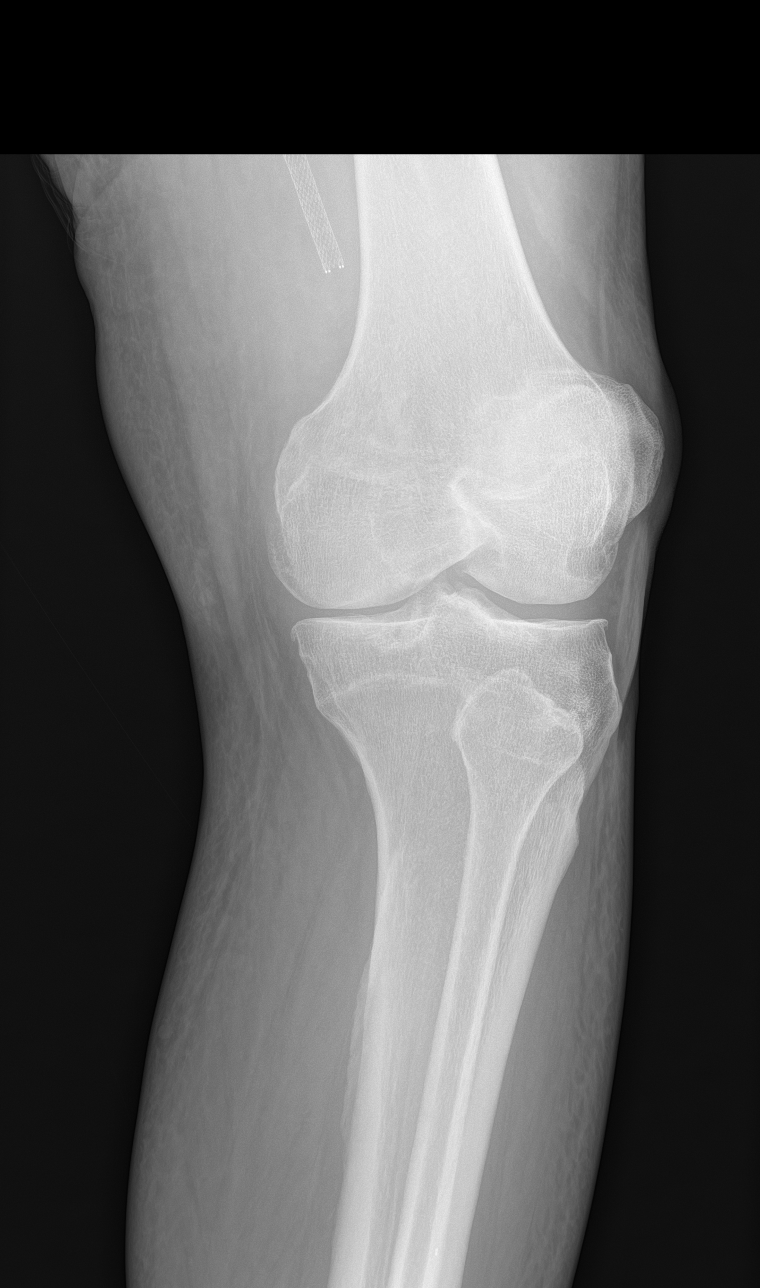

[4 of 4 positions shown; findings below may reference images not displayed]

FINDINGS: Four portable views of the left knee. Mid/distal femoral artery
vascular stent is partially visible. Mild tricompartmental
degenerative joint space loss and spurring at the left knee. No
acute osseous abnormality identified. No definite joint effusion. No
soft tissue gas identified.
IMPRESSION: 1. Tricompartmental left knee joint degeneration with mild joint
space loss. No joint effusion or acute osseous abnormality
identified.
2. Left femoral artery vascular stent.

## 2021-12-26 IMAGING — CT CT KNEE*L* W/CM
3 series · 10 of 35 positions shown, 12 images · IV contrast (agent unspecified)
Comparison: X-ray [DATE]

CLINICAL DATA: Soft tissue infection suspected, knee, xray done

EXAM:
CT OF THE LEFT KNEE WITH CONTRAST
TECHNIQUE: Multidetector CT imaging was performed following the standard
protocol during bolus administration of intravenous contrast.

[Series 4: lower ext 1.5 st · axial · 0.54mm/px · z∈[+186,+313]mm · 2 of 185 slices shown, 3 images]
[im 57/185  soft-tissue]
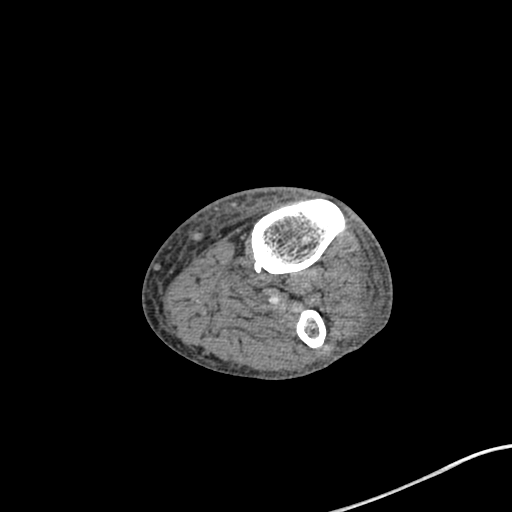
[im 57/185  bone]
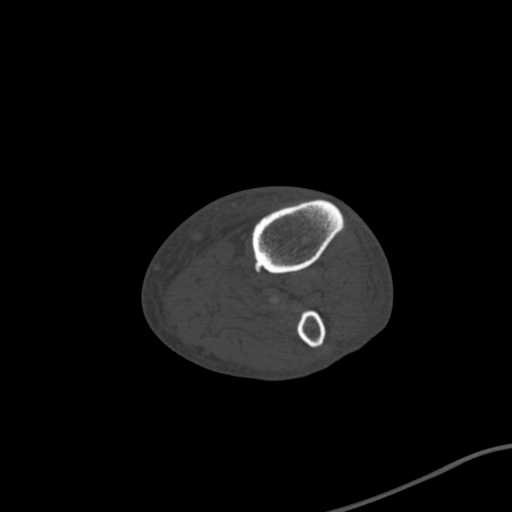
[im 142/185  bone]
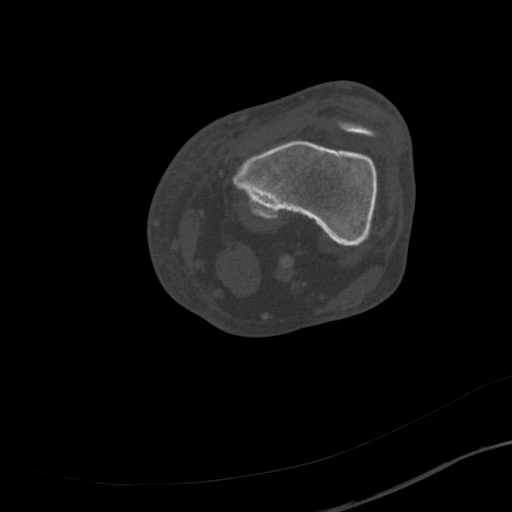

[Series 9: lower ext cor st · coronal · 0.45mm/px · 3 of 130 slices shown]
[im 26/130  bone]
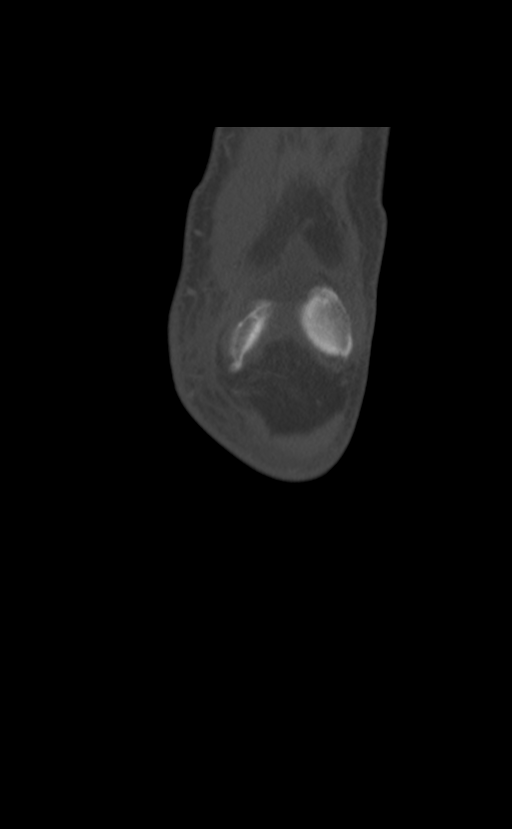
[im 52/130  bone]
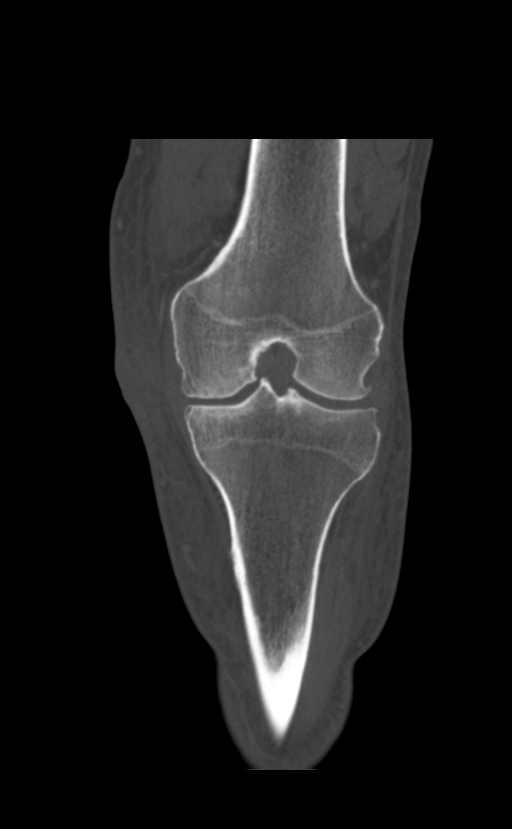
[im 78/130  bone]
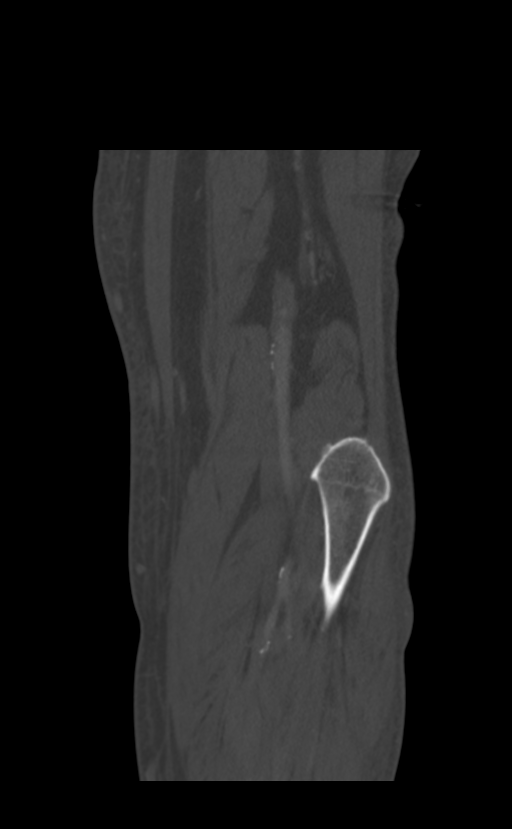

[Series 10: lower ext sag st · sagittal · 0.42mm/px · 5 of 120 slices shown, 6 images]
[im 40/120  bone]
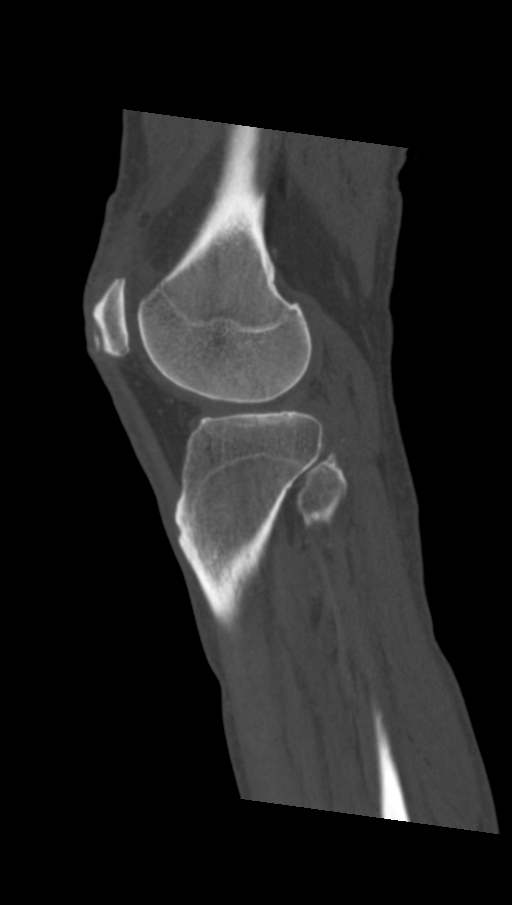
[im 50/120  bone]
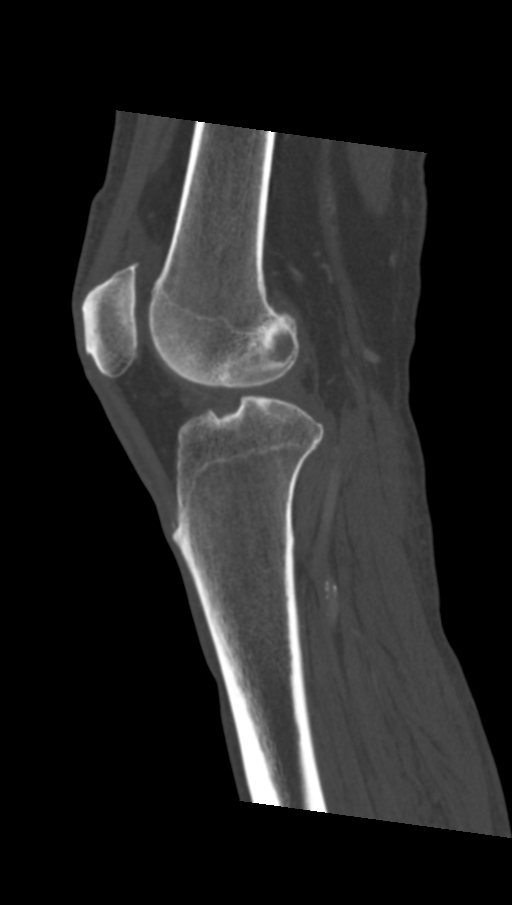
[im 60/120  soft-tissue]
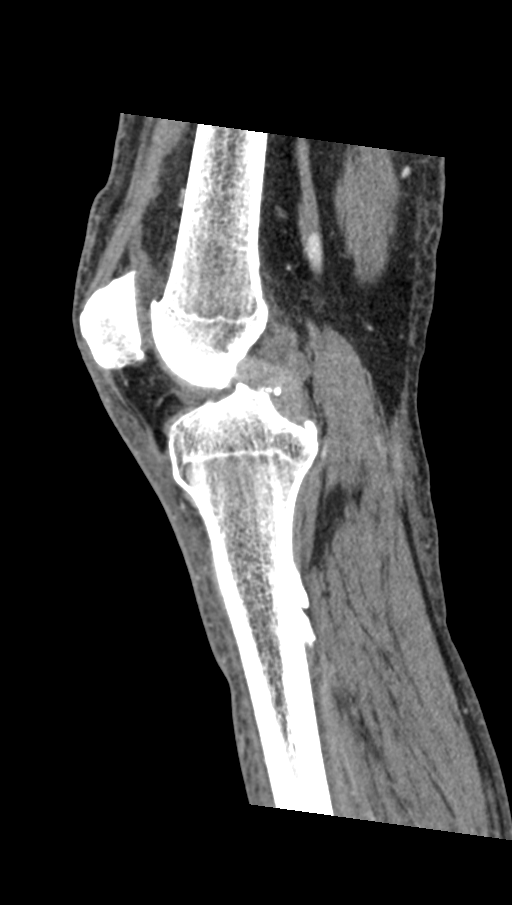
[im 60/120  bone]
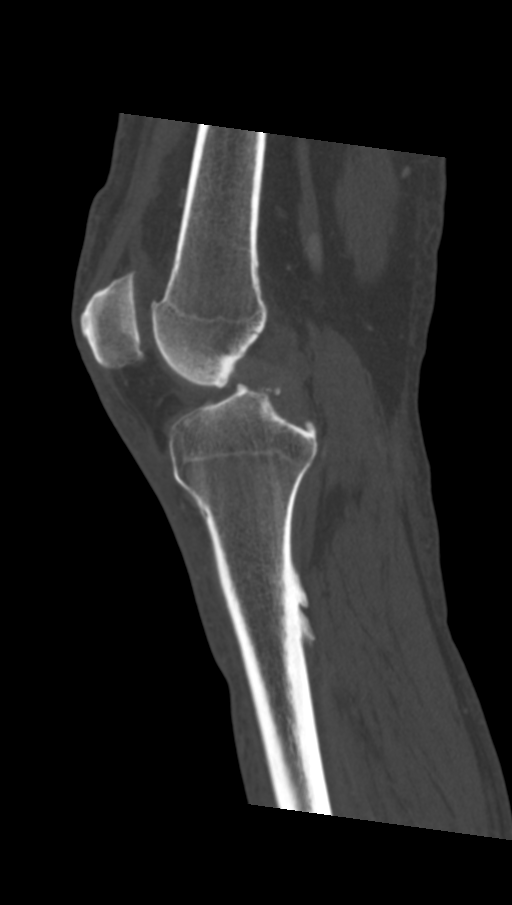
[im 70/120  bone]
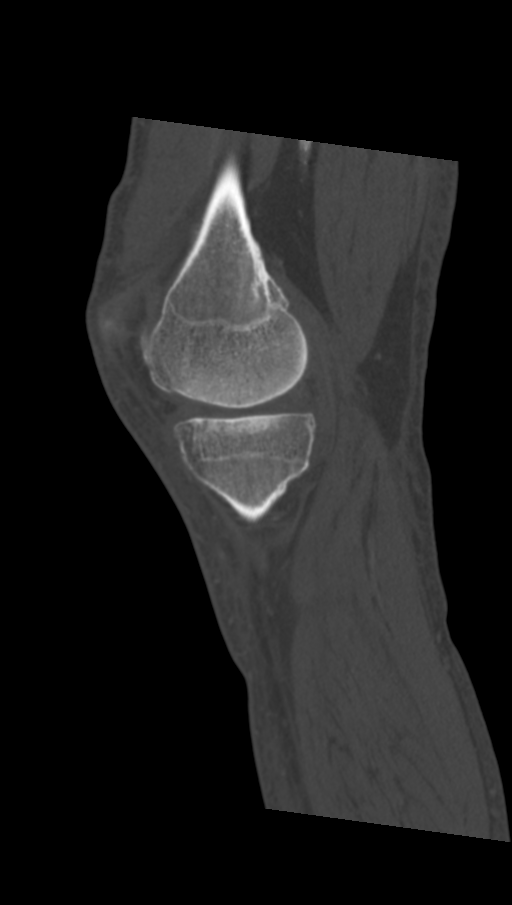
[im 80/120  bone]
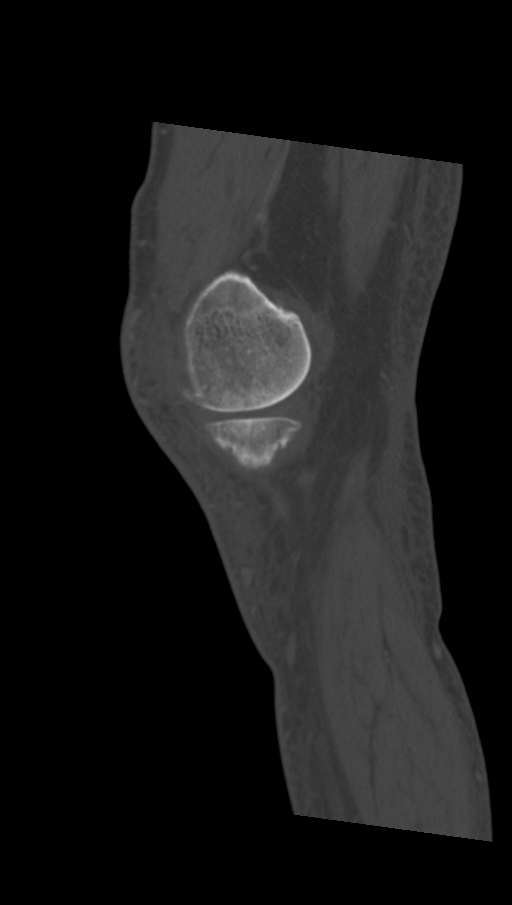

[10 of 35 positions shown; findings below may reference images not displayed]

RADIATION DOSE REDUCTION: This exam was performed according to the
departmental dose-optimization program which includes automated
exposure control, adjustment of the mA and/or kV according to
patient size and/or use of iterative reconstruction technique.

CONTRAST:  100mL OMNIPAQUE IOHEXOL 350 MG/ML SOLN
FINDINGS: Bones/Joint/Cartilage

No acute fracture. No dislocation. Moderate tricompartmental
osteoarthritis of the left knee as manifested by joint space
narrowing, subchondral sclerosis/cystic change, and marginal
osteophyte formation. Findings are most pronounced within the medial
compartment. Trace knee joint effusion, nonspecific. No bone erosion
or periosteal elevation is evident. No lytic or sclerotic bone
lesion identified.

Ligaments

Suboptimally assessed by CT.

Muscles and Tendons

Musculotendinous structures appear within normal limits by CT. No
intramuscular fluid collection is evident.

Soft tissues

Soft tissue swelling with skin thickening and extensive subcutaneous
edema overlying the medial aspect of the knee. Slightly more focal
area of fluid at this location measuring approximately 3.0 x 1.5 x
3.0 cm (series 4, image 71; series 9, image 62), suggesting of a
phlegmon/developing abscess. No well-defined enhancing rim at this
time. No soft tissue gas. No deep fascial fluid collections.
Partially visualized distal superficial femoral arterial stent,
which appears occluded (series 4, image 1). There is collateral
reconstitution of the popliteal artery (series 4, image 30).
IMPRESSION: 1. Soft tissue swelling with skin thickening and extensive
subcutaneous edema overlying the medial aspect of the left knee,
most compatible with cellulitis. Slightly more focal area of fluid
at this location measuring approximately 3.0 x 1.5 x 3.0 cm,
suggesting a phlegmon/developing abscess. No well-defined enhancing
rim at this time. No soft tissue gas.
2. Moderate tricompartmental osteoarthritis of the left knee, most
pronounced within the medial compartment. Trace left knee joint
effusion is likely reactive.
3. Distal superficial femoral artery appears occluded. There is
collateral reconstitution of the popliteal artery. Consider lower
extremity arterial ultrasound for further evaluation.

## 2021-12-26 MED ORDER — ONDANSETRON HCL 4 MG/2ML IJ SOLN: 4.0000 mg | Freq: Four times a day (QID) | INTRAMUSCULAR | Status: DC | PRN

## 2021-12-26 MED ORDER — ACETAMINOPHEN 325 MG PO TABS: 650.0000 mg | ORAL_TABLET | Freq: Four times a day (QID) | ORAL | Status: DC | PRN

## 2021-12-26 MED ORDER — CEFAZOLIN SODIUM-DEXTROSE 2-4 GM/100ML-% IV SOLN
2.0000 g | INTRAVENOUS | Status: DC
  Filled 2021-12-26: qty 100

## 2021-12-26 MED ORDER — POLYETHYLENE GLYCOL 3350 17 G PO PACK: 17.0000 g | PACK | Freq: Every day | ORAL | Status: DC | PRN

## 2021-12-26 MED ORDER — MEDIHONEY WOUND/BURN DRESSING EX PSTE
1.0000 "application " | PASTE | Freq: Every day | CUTANEOUS | Status: DC
  Administered 2021-12-26 – 2021-12-29 (×4): 1 via TOPICAL
  Filled 2021-12-26 (×2): qty 44

## 2021-12-26 MED ORDER — ASPIRIN 81 MG PO TBEC
81.0000 mg | DELAYED_RELEASE_TABLET | Freq: Every day | ORAL | Status: DC
  Administered 2021-12-26 – 2021-12-29 (×2): 81 mg via ORAL
  Filled 2021-12-26 (×3): qty 1

## 2021-12-26 MED ORDER — IOHEXOL 350 MG/ML SOLN
100.0000 mL | Freq: Once | INTRAVENOUS | Status: AC | PRN
  Administered 2021-12-26: 100 mL via INTRAVENOUS

## 2021-12-26 MED ORDER — METHOCARBAMOL 500 MG PO TABS
500.0000 mg | ORAL_TABLET | Freq: Four times a day (QID) | ORAL | Status: DC | PRN
  Administered 2021-12-26 – 2021-12-27 (×2): 500 mg via ORAL
  Filled 2021-12-26 (×2): qty 1

## 2021-12-26 MED ORDER — POVIDONE-IODINE 10 % EX SWAB
2.0000 "application " | Freq: Once | CUTANEOUS | Status: AC
  Administered 2021-12-27: 2 via TOPICAL

## 2021-12-26 MED ORDER — INSULIN GLARGINE-YFGN 100 UNIT/ML ~~LOC~~ SOLN
24.0000 [IU] | Freq: Every day | SUBCUTANEOUS | Status: DC
Start: 2021-12-26 — End: 2021-12-27
  Administered 2021-12-26: 24 [IU] via SUBCUTANEOUS
  Filled 2021-12-26 (×2): qty 0.24

## 2021-12-26 MED ORDER — CHLORHEXIDINE GLUCONATE 4 % EX LIQD
60.0000 mL | Freq: Once | CUTANEOUS | Status: AC
  Administered 2021-12-26: 4 via TOPICAL
  Filled 2021-12-26: qty 60

## 2021-12-26 MED ORDER — SODIUM CHLORIDE 0.9 % IV SOLN: INTRAVENOUS | Status: DC

## 2021-12-26 MED ORDER — MORPHINE SULFATE (PF) 2 MG/ML IV SOLN
2.0000 mg | INTRAVENOUS | Status: DC | PRN
  Administered 2021-12-26: 2 mg via INTRAVENOUS
  Filled 2021-12-26 (×2): qty 1

## 2021-12-26 MED ORDER — CLONAZEPAM 0.5 MG PO TABS
0.5000 mg | ORAL_TABLET | Freq: Two times a day (BID) | ORAL | Status: DC
  Administered 2021-12-26 – 2021-12-29 (×7): 0.5 mg via ORAL
  Filled 2021-12-26 (×7): qty 1

## 2021-12-26 MED ORDER — ATORVASTATIN CALCIUM 80 MG PO TABS
80.0000 mg | ORAL_TABLET | Freq: Every day | ORAL | Status: DC
Start: 2021-12-26 — End: 2021-12-29
  Administered 2021-12-26 – 2021-12-28 (×3): 80 mg via ORAL
  Filled 2021-12-26 (×3): qty 1

## 2021-12-26 MED ORDER — VANCOMYCIN HCL IN DEXTROSE 1-5 GM/200ML-% IV SOLN
1000.0000 mg | Freq: Once | INTRAVENOUS | Status: AC
  Administered 2021-12-26: 1000 mg via INTRAVENOUS
  Filled 2021-12-26: qty 200

## 2021-12-26 MED ORDER — ONDANSETRON HCL 4 MG PO TABS: 4.0000 mg | ORAL_TABLET | Freq: Four times a day (QID) | ORAL | Status: DC | PRN

## 2021-12-26 MED ORDER — ALBUTEROL SULFATE (2.5 MG/3ML) 0.083% IN NEBU: 2.5000 mg | INHALATION_SOLUTION | Freq: Four times a day (QID) | RESPIRATORY_TRACT | Status: DC | PRN

## 2021-12-26 MED ORDER — CITALOPRAM HYDROBROMIDE 20 MG PO TABS
40.0000 mg | ORAL_TABLET | Freq: Every day | ORAL | Status: DC
  Administered 2021-12-26 – 2021-12-28 (×3): 40 mg via ORAL
  Filled 2021-12-26 (×3): qty 2

## 2021-12-26 MED ORDER — ENOXAPARIN SODIUM 40 MG/0.4ML IJ SOSY
40.0000 mg | PREFILLED_SYRINGE | INTRAMUSCULAR | Status: DC
  Administered 2021-12-26 – 2021-12-29 (×3): 40 mg via SUBCUTANEOUS
  Filled 2021-12-26 (×3): qty 0.4

## 2021-12-26 MED ORDER — GABAPENTIN 300 MG PO CAPS
600.0000 mg | ORAL_CAPSULE | Freq: Three times a day (TID) | ORAL | Status: DC
  Administered 2021-12-26 – 2021-12-29 (×10): 600 mg via ORAL
  Filled 2021-12-26 (×10): qty 2

## 2021-12-26 MED ORDER — SODIUM CHLORIDE 0.9 % IV SOLN
2.0000 g | Freq: Three times a day (TID) | INTRAVENOUS | Status: DC
  Administered 2021-12-26 – 2021-12-28 (×7): 2 g via INTRAVENOUS
  Filled 2021-12-26 (×7): qty 12.5

## 2021-12-26 MED ORDER — NICOTINE 21 MG/24HR TD PT24
21.0000 mg | MEDICATED_PATCH | Freq: Every day | TRANSDERMAL | Status: DC
  Administered 2021-12-26 – 2021-12-29 (×4): 21 mg via TRANSDERMAL
  Filled 2021-12-26 (×4): qty 1

## 2021-12-26 MED ORDER — BISACODYL 5 MG PO TBEC: 5.0000 mg | DELAYED_RELEASE_TABLET | Freq: Every day | ORAL | Status: DC | PRN

## 2021-12-26 MED ORDER — INSULIN ASPART 100 UNIT/ML IJ SOLN
0.0000 [IU] | Freq: Three times a day (TID) | INTRAMUSCULAR | Status: DC
  Administered 2021-12-26: 5 [IU] via SUBCUTANEOUS
  Administered 2021-12-26: 2 [IU] via SUBCUTANEOUS
  Administered 2021-12-27 (×2): 5 [IU] via SUBCUTANEOUS
  Administered 2021-12-28: 2 [IU] via SUBCUTANEOUS
  Administered 2021-12-28: 3 [IU] via SUBCUTANEOUS
  Administered 2021-12-28: 2 [IU] via SUBCUTANEOUS
  Administered 2021-12-29: 5 [IU] via SUBCUTANEOUS

## 2021-12-26 MED ORDER — HYDRALAZINE HCL 20 MG/ML IJ SOLN: 5.0000 mg | INTRAMUSCULAR | Status: DC | PRN

## 2021-12-26 MED ORDER — ACETAMINOPHEN 650 MG RE SUPP: 650.0000 mg | Freq: Four times a day (QID) | RECTAL | Status: DC | PRN

## 2021-12-26 MED ORDER — INSULIN ASPART 100 UNIT/ML IJ SOLN
0.0000 [IU] | Freq: Every day | INTRAMUSCULAR | Status: DC
  Administered 2021-12-28: 2 [IU] via SUBCUTANEOUS

## 2021-12-26 MED ORDER — OXYCODONE-ACETAMINOPHEN 5-325 MG PO TABS
1.0000 | ORAL_TABLET | Freq: Once | ORAL | Status: AC
  Administered 2021-12-26: 1 via ORAL
  Filled 2021-12-26: qty 1

## 2021-12-26 MED ORDER — CLOPIDOGREL BISULFATE 75 MG PO TABS
75.0000 mg | ORAL_TABLET | Freq: Every day | ORAL | Status: DC
  Administered 2021-12-26 – 2021-12-28 (×3): 75 mg via ORAL
  Filled 2021-12-26 (×3): qty 1

## 2021-12-26 MED ORDER — VANCOMYCIN HCL IN DEXTROSE 1-5 GM/200ML-% IV SOLN
1000.0000 mg | Freq: Three times a day (TID) | INTRAVENOUS | Status: DC
  Administered 2021-12-26 – 2021-12-29 (×8): 1000 mg via INTRAVENOUS
  Filled 2021-12-26 (×12): qty 200

## 2021-12-26 MED ORDER — DOCUSATE SODIUM 100 MG PO CAPS
100.0000 mg | ORAL_CAPSULE | Freq: Two times a day (BID) | ORAL | Status: DC
  Administered 2021-12-26 – 2021-12-27 (×2): 100 mg via ORAL
  Filled 2021-12-26 (×3): qty 1

## 2021-12-26 MED ORDER — OXYCODONE HCL 5 MG PO TABS
5.0000 mg | ORAL_TABLET | ORAL | Status: DC | PRN
  Administered 2021-12-26 – 2021-12-27 (×3): 5 mg via ORAL
  Filled 2021-12-26 (×3): qty 1

## 2021-12-26 NOTE — Plan of Care (Signed)

## 2021-12-26 NOTE — Consult Note (Addendum)
Hospital Consult  VASCULAR SURGERY ASSESSMENT & PLAN:   PERIPHERAL ARTERIAL DISEASE WITH WOUND LEFT LEG: This patient has a known superficial femoral artery occlusion on the left with reconstitution of the popliteal artery at the level of the knee and two-vessel runoff via the posterior tibial and peroneal arteries.  He has what appears to be a spider bite on the medial aspect of his left knee.  Of note, despite his infrainguinal arterial occlusive disease, he did heal the wound on his left foot.  I suspect that he has adequate circulation at the level of the knee to heal the wound.  I would agree with intravenous antibiotics.  I currently does not look like there is anything specifically to drain.  If the wound did not continue to improve with IV antibiotics and local wound care, the only option from our standpoint would be a femoral to popliteal artery bypass.  I do not think this is likely, however, we will obtain a vein map and ABIs to have in case he ultimately does require bypass.  He has a normal femoral pulse on the left with a brisk posterior tibial signal and a monophasic peroneal signal.  His anterior tibial is occluded.   There is significant induration associated with the wound.   His CT scan shows skin thickening and subcutaneous edema associated with this wound.  I do not see a discrete abscess.  Cari Caraway, MD 2:21 PM   Reason for Consult:  abnormal ABIs, LLE wound Requesting Physician:  ER MRN #:  [id number]  History of Present Illness: This is a 61 y.o. male with past medical history significant for hypertension, insulin-dependent diabetes mellitus, and PAD.  Surgical history significant for right SFA and popliteal stenting by Dr. Lenell Antu in December 2022.  He subsequently required a right below the knee amputation by Dr. Lajoyce Corners due to nonhealing foot wound.  He previously underwent left SFA stenting which are known to be occluded based on angiography from December.  He has  also previously underwent second left toe ray amputation by Dr. Lajoyce Corners with.  He states his left foot wound is healed however presented to the emergency department due to worsening redness and ulceration of his medial left knee with surrounding erythema.  He believes this is related to a spider bite.  He continues to smoke daily.  He is on aspirin andPlavix daily.  He denies fevers, chills, nausea/vomiting.   Past Medical History:  Diagnosis Date   COVID    has had Covid 3 times, not sick with it   Depression    Diabetes mellitus without complication (HCC)    pt states he has now been told that he is a type 1 diabetic.   Hypertension    Peripheral vascular disease [facility])     Past Surgical History:  Procedure Laterality Date   ABDOMINAL AORTOGRAM W/LOWER EXTREMITY N/A 08/11/2021   Procedure: ABDOMINAL AORTOGRAM W/LOWER EXTREMITY;  Surgeon: Leonie Douglas, MD;  Location: [facility];  Service: Cardiovascular;  Laterality: N/A;   AMPUTATION Left 11/15/2017   Procedure: LEFT FOOT 2ND RAY AMPUTATION;  Surgeon: Nadara Mustard, MD;  Location: [facility] OR;  Service: Orthopedics;  Laterality: Left;   AMPUTATION Right 08/25/2021   Procedure: RIGHT TRANSMETATARSAL AMPUTATION;  Surgeon: Nadara Mustard, MD;  Location: [facility] OR;  Service: Orthopedics;  Laterality: Right;   AMPUTATION Right 09/27/2021   Procedure: BELOW KNEE AMPUTATION;  Surgeon: Nadara Mustard, MD;  Location: [facility] OR;  Service: Orthopedics;  Laterality: Right;   CERVICAL FUSION     IR ANGIOGRAM EXTREMITY LEFT  11/14/2017   IR ANGIOGRAM SELECTIVE EACH ADDITIONAL VESSEL  11/14/2017   IR ANGIOGRAM SELECTIVE EACH ADDITIONAL VESSEL  11/14/2017   IR TIB-PERO ART PTA MOD SED  11/14/2017   IR TIB-PERO ART UNI PTA EA ADD VESSEL MOD SED  11/14/2017   IR US GUIDE VASC ACCESS LEFT  11/14/2017   PERIPHERAL VASCULAR INTERVENTION Right 08/11/2021   Procedure: PERIPHERAL VASCULAR INTERVENTION;  Surgeon: Leonie Douglas, MD;  Location: [facility];  Service:  Cardiovascular;  Laterality: Right;  rt sfa stent    Allergies  Allergen Reactions   Ceftriaxone Itching, Nausea And Vomiting and Rash    Noted minutes after starting medication    Prior to Admission medications   Medication Sig Start Date End Date Taking? Authorizing Provider  acetaminophen (TYLENOL) 325 MG tablet Take 1-2 tablets (325-650 mg total) by mouth every 4 (four) hours as needed for mild pain. 10/05/21  Yes Angiulli, Mcarthur Rossetti, PA-C  albuterol (VENTOLIN HFA) 108 (90 Base) MCG/ACT inhaler Inhale 2 puffs into the lungs every 6 (six) hours as needed for shortness of breath. 10/05/21  Yes Angiulli, Mcarthur Rossetti, PA-C  ascorbic acid (VITAMIN C) 500 MG tablet Take 1 tablet (500 mg total) by mouth 2 (two) times daily. Patient taking differently: Take 500 mg by mouth daily. 10/05/21  Yes Angiulli, Mcarthur Rossetti, PA-C  aspirin 81 MG EC tablet Take 1 tablet (81 mg total) by mouth daily. Swallow whole. 08/11/21 08/11/22 Yes Leonie Douglas, MD  atorvastatin (LIPITOR) 80 MG tablet Take 1 tablet (80 mg total) by mouth daily. Patient taking differently: Take 80 mg by mouth at bedtime. 10/05/21  Yes Angiulli, Mcarthur Rossetti, PA-C  citalopram (CELEXA) 40 MG tablet Take 40 mg by mouth at bedtime. 09/26/21  Yes [provider]  clonazePAM (KLONOPIN) 0.5 MG tablet Take 1 tablet (0.5 mg total) by mouth 2 (two) times daily. 10/05/21  Yes Angiulli, Mcarthur Rossetti, PA-C  clopidogrel (PLAVIX) 75 MG tablet Take 75 mg by mouth at bedtime. 12/07/21  Yes [provider]  gabapentin (NEURONTIN) 600 MG tablet Take 1 tablet (600 mg total) by mouth 3 (three) times daily. 10/05/21  Yes Angiulli, Mcarthur Rossetti, PA-C  HUMALOG KWIKPEN 100 UNIT/ML KwikPen Inject 0-12 Units into the skin 3 (three) times daily. 12/07/21  Yes [provider]  LANTUS 100 UNIT/ML injection Inject 24 Units into the skin at bedtime. 12/07/21  Yes [provider]  metFORMIN (GLUCOPHAGE) 500 MG tablet Take 1 tablet (500 mg total) by mouth 2  (two) times daily with a meal. 10/05/21 10/05/22 Yes Angiulli, Mcarthur Rossetti, PA-C  methocarbamol (ROBAXIN) 500 MG tablet Take 1 tablet (500 mg total) by mouth every 6 (six) hours as needed for muscle spasms. 10/05/21  Yes Angiulli, Mcarthur Rossetti, PA-C  Multiple Vitamin (MULTIVITAMIN WITH MINERALS) TABS tablet Take 1 tablet by mouth daily. 11/17/17  Yes Hall, Oliver Pila, DO  nicotine (NICODERM CQ - DOSED IN MG/24 HOURS) 21 mg/24hr patch 21 mg patch daily x2 weeks then 14 mg patch daily x3 weeks then 7 mg patch daily x3 weeks and stop 10/05/21  Yes Angiulli, Mcarthur Rossetti, PA-C  oxyCODONE-acetaminophen (PERCOCET) 10-325 MG tablet Take 1 tablet by mouth every 4 (four) hours as needed for pain. 12/04/21  Yes Nadara Mustard, MD  Vitamin D, Ergocalciferol, (DRISDOL) 1.25 MG (50000 UNIT) CAPS capsule Take 1 capsule (50,000 Units total) by mouth every 7 (seven)  days. Patient taking differently: Take 50,000 Units by mouth every Wednesday. 07/26/21  Yes Ghimire, Werner Lean, MD    Social History   Socioeconomic History   Marital status: Married    Spouse name: Not on file   Number of children: Not on file   Years of education: Not on file   Highest education level: Not on file  Occupational History   Not on file  Tobacco Use   Smoking status: Every Day    Packs/day: 0.50    Years: 47.00    Pack years: 23.50    Types: Cigarettes    Last attempt to quit: 08/11/2021    Years since quitting: 0.3   Smokeless tobacco: Never  Vaping Use   Vaping Use: Some days  Substance and Sexual Activity   Alcohol use: No   Drug use: No   Sexual activity: Not on file  Other Topics Concern   Not on file  Social History Narrative   Not on file   Social Determinants of Health   Financial Resource Strain: Not on file  Food Insecurity: Not on file  Transportation Needs: Not on file  Physical Activity: Not on file  Stress: Not on file  Social Connections: Not on file  Intimate Partner Violence: Not on file     History  reviewed. No pertinent family history.  ROS: Otherwise negative unless mentioned in HPI  Physical Examination  Vitals:   12/26/21 0930 12/26/21 1000  BP: (!) 158/81 (!) 148/81  Pulse: 79 77  Resp: 20 16  Temp:    SpO2: 99% 93%   There is no height or weight on file to calculate BMI.  General:  WDWN in NAD Gait: Not observed HENT: WNL, normocephalic Pulmonary: normal non-labored breathing, without Rales, rhonchi,  wheezing Cardiac: regular Abdomen:  soft, NT/ND, no masses Skin: without rashes Vascular Exam/Pulses: Left PT brisk by Doppler Extremities: Left medial knee necrotic ulceration with surrounding erythema and possible ascending lymphangitis; plantar left foot with callus overlying suture line from previous second ray amputation Musculoskeletal: no muscle wasting or atrophy  Neurologic: A&O X 3;  No focal weakness or paresthesias are detected; speech is fluent/normal Psychiatric:  The pt has Normal affect. Lymph:  Unremarkable  CBC    Component Value Date/Time   WBC 13.3 (H) 12/25/2021 2235   RBC 5.01 12/25/2021 2235   HGB 15.9 12/25/2021 2235   HCT 48.3 12/25/2021 2235   PLT 150 12/25/2021 2235   MCV 96.4 12/25/2021 2235   MCH 31.7 12/25/2021 2235   MCHC 32.9 12/25/2021 2235   RDW 13.7 12/25/2021 2235   LYMPHSABS 2.2 12/25/2021 2235   MONOABS 0.8 12/25/2021 2235   EOSABS 0.4 12/25/2021 2235   BASOSABS 0.1 12/25/2021 2235    BMET    Component Value Date/Time   NA 138 12/25/2021 2235   K 3.6 12/25/2021 2235   CL 102 12/25/2021 2235   CO2 28 12/25/2021 2235   GLUCOSE 286 (H) 12/25/2021 2235   BUN 6 (L) 12/25/2021 2235   CREATININE 0.96 12/25/2021 2235   CALCIUM 8.5 (L) 12/25/2021 2235   GFRNONAA >60 12/25/2021 2235   GFRAA >60 11/14/2017 0936    COAGS: Lab Results  Component Value Date   INR 1.05 11/10/2017   INR 1.08 11/08/2017     Non-Invasive Vascular Imaging:   Angiography performed in December 2022 demonstrates a flush occlusion of the  left SFA with reconstitution above the knee popliteal.  Anterior tibial occluded.  Dominant runoff to  the posterior tibial artery  ABIs performed on 09/17/2021 demonstrates left ABI of 0.66 with TBI of 45 mmHg    ASSESSMENT/PLAN: This is a 61 y.o. male with left knee infection  -Patient presented secondary to pain from active infection of left knee with possible abscess formation.  Plans noted for debridement by Dr. Lajoyce Corners in the operating room tomorrow.  Left plantar foot ulcer is nearly healed.  He has a known left SFA occlusion with reconstitution of the popliteal artery above the knee based on angiography from December 2022.  At the time of angiography plan was to proceed with left common femoral to popliteal bypass by Dr. Lenell Antu however patient was lost to follow-up.  Left foot ulcer has since improved dramatically.  If needed, bypass surgery would likely have to wait until left knee infection has cleared given the location of his wounds on the medial side of his left leg.  Agree with broad-spectrum IV antibiotics for now.  Emilie Rutter PA-C [facility] [phone]  Cari Caraway, MD 2:22 PM

## 2021-12-26 NOTE — Progress Notes (Addendum)
Inpatient Diabetes Program Recommendations ? ?AACE/ADA: New Consensus Statement on Inpatient Glycemic Control (2015) ? ?Target Ranges:  Prepandial:   less than 140 mg/dL ?     Peak postprandial:   less than 180 mg/dL (1-2 hours) ?     Critically ill patients:  140 - 180 mg/dL  ? ?Lab Results  ?Component Value Date  ? GLUCAP 305 (H) 12/25/2021  ? HGBA1C 9.2 (H) 09/27/2021  ? ? ?Review of Glycemic Control ? Latest Reference Range & Units 12/25/21 22:37  ?Glucose-Capillary 70 - 99 mg/dL 542 (H)  ? ?Diabetes history: DM 2 ?Outpatient Diabetes medications: Lantus 24 units qhs, Metformin 500 mg bid, Humalog 0-12 units tid ?Current orders for Inpatient glycemic control:  ?None in ED being evaluated ? ?Inpatient Diabetes Program Recommendations:   ? ?-  Consider Semglee 20 units Daily ?-  Novolog 0-15 units tid + hs scale ? ?Thanks, ? ?Christena Deem RN, MSN, BC-ADM ?Inpatient Diabetes Coordinator ?Team Pager [phone] (8a-5p) ?

## 2021-12-26 NOTE — Consult Note (Signed)
WOC Nurse Consult Note: ?Patient receiving care in [facility] ED39. Consult completed remotely after review of record and images. ?Reason for Consult: LE wound ?Wound type: draining wound to left medial knee. Unclear etiology at this time. Presumed infectious in nature. ?Pressure Injury POA: Yes/No/NA ?Measurement: ?Wound bed: discolored, see photos ?Drainage (amount, consistency, odor) tan ?Periwound: two additional smaller "spots" adjacent to the larger draining wound ?Dressing procedure/placement/frequency: ?Cleanse the left medial knee wound with NS. Pat dry then apply a nickel thick layer of MediHoney directly to the wound then cover with gauze and secure with a few turns of kerlix. Change dressing daily.  ? ?Monitor the wound area(s) for worsening of condition such as: ?Signs/symptoms of infection,  ?Increase in size,  ?Development of or worsening of odor, ?Development of pain, or increased pain at the affected locations.  Notify the medical team if any of these develop. ? ?Thank you for the consult. WOC nurse will not follow at this time.  Please re-consult the WOC team if needed. ? ?Helmut Muster, RN, MSN, CWOCN, CNS-BC, pager [phone]  ?

## 2021-12-26 NOTE — H&P (Signed)
?History and Physical  ? ? ?Patient: Cody Pena FHL:[id number] DOB: 03/18/1961 ?DOA: 12/25/2021 ?DOS: the patient was seen and examined on 12/26/2021 ?PCP: Pcp, No  ?Patient coming from: Home - lives with wife and daughter and grandson; NOK: Wife, Ottis Vacha, [phone] ? ? ?Chief Complaint: Wound infection ? ?HPI: Cody Pena is a 61 y.o. 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  He reports that he developed a pustule on his left arm about a week or two ago.  He put heat on it and it came to a head and he squeezed it.  He is developing an adjacent pustule in that area now but it is doing ok.  However, about 3 days ago he developed another lesion on his medial L knee.  He thought it must have been a spider bite but it has gotten bigger and more painful and is draining some purulent fluid.  He is s/p R BKA in February and also has had L toe amputation with callus formation along the left midfoot.  He has not had systemic symptoms. ? ? ? ?ER Course:  h/o recent BKA.  "Spider bite" with necrotic wound infection.  Given Vanc. ? ? ?Review of Systems: As mentioned in the history of present illness. All other systems reviewed and are negative. ?Past Medical History:  ?Diagnosis Date  ? COVID   ? has had Covid 3 times, not sick with it  ? Depression   ? Diabetes mellitus without complication (HCC)   ? pt states he has now been told that he is a type 1 diabetic.  ? Hypertension   ? Peripheral vascular disease (HCC)   ? ?Past Surgical History:  ?Procedure Laterality Date  ? ABDOMINAL AORTOGRAM W/LOWER EXTREMITY N/A 08/11/2021  ? Procedure: ABDOMINAL AORTOGRAM W/LOWER EXTREMITY;  Surgeon: Leonie Douglas, MD;  Location: [facility];  Service: Cardiovascular;  Laterality: N/A;  ? AMPUTATION Left 11/15/2017  ? Procedure: LEFT FOOT 2ND RAY AMPUTATION;  Surgeon: Nadara Mustard, MD;  Location: [facility] OR;  Service: Orthopedics;   Laterality: Left;  ? AMPUTATION Right 08/25/2021  ? Procedure: RIGHT TRANSMETATARSAL AMPUTATION;  Surgeon: Nadara Mustard, MD;  Location: [facility] OR;  Service: Orthopedics;  Laterality: Right;  ? AMPUTATION Right 09/27/2021  ? Procedure: BELOW KNEE AMPUTATION;  Surgeon: Nadara Mustard, MD;  Location: [facility] OR;  Service: Orthopedics;  Laterality: Right;  ? CERVICAL FUSION    ? IR ANGIOGRAM EXTREMITY LEFT  11/14/2017  ? IR ANGIOGRAM SELECTIVE EACH ADDITIONAL VESSEL  11/14/2017  ? IR ANGIOGRAM SELECTIVE EACH ADDITIONAL VESSEL  11/14/2017  ? IR TIB-PERO ART PTA MOD SED  11/14/2017  ? IR TIB-PERO ART UNI PTA EA ADD VESSEL MOD SED  11/14/2017  ? IR US GUIDE VASC ACCESS LEFT  11/14/2017  ? PERIPHERAL VASCULAR INTERVENTION Right 08/11/2021  ? Procedure: PERIPHERAL VASCULAR INTERVENTION;  Surgeon: Leonie Douglas, MD;  Location: [facility];  Service: Cardiovascular;  Laterality: Right;  rt sfa stent  ? ?Social History:  reports that he has been smoking cigarettes. He has a 23.50 pack-year smoking history. He has never used smokeless tobacco. He reports that he does not drink alcohol and does not use drugs. ? ?Allergies  ?Allergen Reactions  ? Ceftriaxone Itching, Nausea And Vomiting and Rash  ?  Noted minutes after starting medication  ? ? ?History reviewed. No pertinent family history. ? ?Prior to  Admission medications   ?Medication Sig Start Date End Date Taking? Authorizing Provider  ?acetaminophen (TYLENOL) 325 MG tablet Take 1-2 tablets (325-650 mg total) by mouth every 4 (four) hours as needed for mild pain. 10/05/21  Yes Angiulli, Mcarthur Rossettianiel J, PA-C  ?albuterol (VENTOLIN HFA) 108 (90 Base) MCG/ACT inhaler Inhale 2 puffs into the lungs every 6 (six) hours as needed for shortness of breath. 10/05/21  Yes Angiulli, Mcarthur Rossettianiel J, PA-C  ?ascorbic acid (VITAMIN C) 500 MG tablet Take 1 tablet (500 mg total) by mouth 2 (two) times daily. ?Patient taking differently: Take 500 mg by mouth daily. 10/05/21  Yes Angiulli, Mcarthur Rossettianiel J, PA-C  ?aspirin 81 MG  EC tablet Take 1 tablet (81 mg total) by mouth daily. Swallow whole. 08/11/21 08/11/22 Yes Leonie DouglasHawken, Thomas N, MD  ?atorvastatin (LIPITOR) 80 MG tablet Take 1 tablet (80 mg total) by mouth daily. ?Patient taking differently: Take 80 mg by mouth at bedtime. 10/05/21  Yes Angiulli, Mcarthur Rossettianiel J, PA-C  ?citalopram (CELEXA) 40 MG tablet Take 40 mg by mouth at bedtime. 09/26/21  Yes [provider]  ?clonazePAM (KLONOPIN) 0.5 MG tablet Take 1 tablet (0.5 mg total) by mouth 2 (two) times daily. 10/05/21  Yes Angiulli, Mcarthur Rossettianiel J, PA-C  ?clopidogrel (PLAVIX) 75 MG tablet Take 75 mg by mouth at bedtime. 12/07/21  Yes [provider]  ?gabapentin (NEURONTIN) 600 MG tablet Take 1 tablet (600 mg total) by mouth 3 (three) times daily. 10/05/21  Yes Angiulli, Mcarthur Rossettianiel J, PA-C  ?HUMALOG KWIKPEN 100 UNIT/ML KwikPen Inject 0-12 Units into the skin 3 (three) times daily. 12/07/21  Yes [provider]  ?LANTUS 100 UNIT/ML injection Inject 24 Units into the skin at bedtime. 12/07/21  Yes [provider]  ?metFORMIN (GLUCOPHAGE) 500 MG tablet Take 1 tablet (500 mg total) by mouth 2 (two) times daily with a meal. 10/05/21 10/05/22 Yes Angiulli, Mcarthur Rossettianiel J, PA-C  ?methocarbamol (ROBAXIN) 500 MG tablet Take 1 tablet (500 mg total) by mouth every 6 (six) hours as needed for muscle spasms. 10/05/21  Yes Angiulli, Mcarthur Rossettianiel J, PA-C  ?Multiple Vitamin (MULTIVITAMIN WITH MINERALS) TABS tablet Take 1 tablet by mouth daily. 11/17/17  Yes Darlin DropHall, Carole N, DO  ?nicotine (NICODERM CQ - DOSED IN MG/24 HOURS) 21 mg/24hr patch 21 mg patch daily x2 weeks then 14 mg patch daily x3 weeks then 7 mg patch daily x3 weeks and stop 10/05/21  Yes Angiulli, Mcarthur Rossettianiel J, PA-C  ?oxyCODONE-acetaminophen (PERCOCET) 10-325 MG tablet Take 1 tablet by mouth every 4 (four) hours as needed for pain. 12/04/21  Yes Nadara Mustarduda, Marcus V, MD  ?Vitamin D, Ergocalciferol, (DRISDOL) 1.25 MG (50000 UNIT) CAPS capsule Take 1 capsule (50,000 Units total) by mouth every 7 (seven)  days. ?Patient taking differently: Take 50,000 Units by mouth every Wednesday. 07/26/21  Yes Ghimire, Werner LeanShanker M, MD  ?citalopram (CELEXA) 20 MG tablet Take 1 tablet (20 mg total) by mouth daily. ?Patient not taking: Reported on 12/26/2021 10/05/21   Angiulli, Mcarthur Rossettianiel J, PA-C  ?docusate sodium (COLACE) 100 MG capsule Take 1 capsule (100 mg total) by mouth daily. ?Patient not taking: Reported on 12/26/2021 10/01/21   Nadara Mustarduda, Marcus V, MD  ?insulin glargine-yfgn (SEMGLEE) 100 UNIT/ML injection Inject 0.22 mLs (22 Units total) into the skin at bedtime. ?Patient not taking: Reported on 12/26/2021 10/05/21   Charlton AmorAngiulli, Daniel J, PA-C  ?oxyCODONE (ROXICODONE) 15 MG immediate release tablet Take 1 tablet (15 mg total) by mouth every 4 (four) hours as needed for severe pain. ?Patient not taking:  Reported on 12/26/2021 10/05/21   Charlton Amor, PA-C  ?pantoprazole (PROTONIX) 40 MG tablet Take 1 tablet (40 mg total) by mouth daily. ?Patient not taking: Reported on 12/26/2021 10/05/21   Charlton Amor, PA-C  ?polyethylene glycol (MIRALAX / GLYCOLAX) 17 g packet Take 17 g by mouth daily as needed for mild constipation. ?Patient not taking: Reported on 12/26/2021 09/20/21   Littie Deeds, MD  ?zinc sulfate 220 (50 Zn) MG capsule Take 1 capsule (220 mg total) by mouth daily. ?Patient not taking: Reported on 12/26/2021 10/05/21   Charlton Amor, PA-C  ? ? ?Physical Exam: ?Vitals:  ? 12/26/21 0900 12/26/21 0930 12/26/21 1000 12/26/21 1528  ?BP: 132/79 (!) 158/81 (!) 148/81 (!) 165/80  ?Pulse: 74 79 77 91  ?Resp: 16 20 16 18   ?Temp:    98.6 ?F (37 ?C)  ?TempSrc:    Oral  ?SpO2: 94% 99% 93% 95%  ? ?General:  Appears calm and comfortable and is in NAD ?Eyes:  EOMI, normal lids, iris ?ENT:  grossly normal hearing, lips & tongue, mmm ?Neck:  no LAD, masses or thyromegaly ?Cardiovascular:  RRR, no m/r/g. No LE edema.  ?Respiratory:   CTA bilaterally with no wheezes/rales/rhonchi.  Normal respiratory effort. ?Abdomen:  soft, NT, ND ?Skin:  small  area of erythema with a healing pustule and another adjacent developing pustule on his left arm; L medial knee with a large pustule with purulent drainage and necrosis with surrounding erythema and streaking eryt

## 2021-12-26 NOTE — Consult Note (Signed)
Reason for Consult:Left knee infection ?Referring Physician: Jonah Pena ?Time called: 1034 ?Time at bedside: 1045 ? ? ?Cody Pena is an 61 y.o. male.  ?HPI: July comes in to the ED for evaluation of a left knee infection. It began about 4d ago. He tried using warm compresses without success. He describes a very small amount of discharge from the wound. He notes that he had an infection of the left forearm that preceded it. He has been able to bear weight. He denies fevers, chills, sweats, N/V though has had diarrhea for >1 week. ? ?Past Medical History:  ?Diagnosis Date  ? COVID   ? has had Covid 3 times, not sick with it  ? Depression   ? Diabetes mellitus without complication (HCC)   ? pt states he has now been told that he is a type 1 diabetic.  ? Hypertension   ? Peripheral vascular disease (HCC)   ? ? ?Past Surgical History:  ?Procedure Laterality Date  ? ABDOMINAL AORTOGRAM W/LOWER EXTREMITY N/A 08/11/2021  ? Procedure: ABDOMINAL AORTOGRAM W/LOWER EXTREMITY;  Surgeon: Cody Douglas, MD;  Location: [facility];  Service: Cardiovascular;  Laterality: N/A;  ? AMPUTATION Left 11/15/2017  ? Procedure: LEFT FOOT 2ND RAY AMPUTATION;  Surgeon: Nadara Mustard, MD;  Location: [facility] OR;  Service: Orthopedics;  Laterality: Left;  ? AMPUTATION Right 08/25/2021  ? Procedure: RIGHT TRANSMETATARSAL AMPUTATION;  Surgeon: Nadara Mustard, MD;  Location: [facility] OR;  Service: Orthopedics;  Laterality: Right;  ? AMPUTATION Right 09/27/2021  ? Procedure: BELOW KNEE AMPUTATION;  Surgeon: Nadara Mustard, MD;  Location: [facility] OR;  Service: Orthopedics;  Laterality: Right;  ? CERVICAL FUSION    ? IR ANGIOGRAM EXTREMITY LEFT  11/14/2017  ? IR ANGIOGRAM SELECTIVE EACH ADDITIONAL VESSEL  11/14/2017  ? IR ANGIOGRAM SELECTIVE EACH ADDITIONAL VESSEL  11/14/2017  ? IR TIB-PERO ART PTA MOD SED  11/14/2017  ? IR TIB-PERO ART UNI PTA EA ADD VESSEL MOD SED  11/14/2017  ? IR US GUIDE VASC ACCESS LEFT  11/14/2017  ? PERIPHERAL VASCULAR INTERVENTION Right  08/11/2021  ? Procedure: PERIPHERAL VASCULAR INTERVENTION;  Surgeon: Cody Douglas, MD;  Location: [facility];  Service: Cardiovascular;  Laterality: Right;  rt sfa stent  ? ? ?History reviewed. No pertinent family history. ? ?Social History:  reports that he has been smoking cigarettes. He has a 23.50 pack-year smoking history. He has never used smokeless tobacco. He reports that he does not drink alcohol and does not use drugs. ? ?Allergies:  ?Allergies  ?Allergen Reactions  ? Ceftriaxone Itching, Nausea And Vomiting and Rash  ?  Noted minutes after starting medication  ? ? ?Medications: I have reviewed the patient's current medications. ? ?Results for orders placed or performed during the hospital encounter of 12/25/21 (from the past 48 hour(s))  ?CBC with Differential     Status: Abnormal  ? Collection Time: 12/25/21 10:35 PM  ?Result Value Ref Range  ? WBC 13.3 (H) 4.0 - 10.5 K/uL  ? RBC 5.01 4.22 - 5.81 MIL/uL  ? Hemoglobin 15.9 13.0 - 17.0 g/dL  ? HCT 48.3 39.0 - 52.0 %  ? MCV 96.4 80.0 - 100.0 fL  ? MCH 31.7 26.0 - 34.0 pg  ? MCHC 32.9 30.0 - 36.0 g/dL  ? RDW 13.7 11.5 - 15.5 %  ? Platelets 150 150 - 400 K/uL  ? nRBC 0.0 0.0 - 0.2 %  ? Neutrophils Relative % 73 %  ?  Neutro Abs 9.8 (H) 1.7 - 7.7 K/uL  ? Lymphocytes Relative 16 %  ? Lymphs Abs 2.2 0.7 - 4.0 K/uL  ? Monocytes Relative 6 %  ? Monocytes Absolute 0.8 0.1 - 1.0 K/uL  ? Eosinophils Relative 3 %  ? Eosinophils Absolute 0.4 0.0 - 0.5 K/uL  ? Basophils Relative 1 %  ? Basophils Absolute 0.1 0.0 - 0.1 K/uL  ? Immature Granulocytes 1 %  ? Abs Immature Granulocytes 0.06 0.00 - 0.07 K/uL  ?  Comment: Performed at [facility], [address] [place], [place] [address]  ?Comprehensive metabolic panel     Status: Abnormal  ? Collection Time: 12/25/21 10:35 PM  ?Result Value Ref Range  ? Sodium 138 135 - 145 mmol/L  ? Potassium 3.6 3.5 - 5.1 mmol/L  ? Chloride 102 98 - 111 mmol/L  ? CO2 28 22 - 32 mmol/L  ? Glucose, Bld 286 (H) 70 - 99 mg/dL   ?  Comment: Glucose reference range applies only to samples taken after fasting for at least 8 hours.  ? BUN 6 (L) 8 - 23 mg/dL  ? Creatinine, Ser 0.96 0.61 - 1.24 mg/dL  ? Calcium 8.5 (L) 8.9 - 10.3 mg/dL  ? Total Protein 6.1 (L) 6.5 - 8.1 g/dL  ? Albumin 3.2 (L) 3.5 - 5.0 g/dL  ? AST 14 (L) 15 - 41 U/L  ? ALT 17 0 - 44 U/L  ? Alkaline Phosphatase 116 38 - 126 U/L  ? Total Bilirubin 0.6 0.3 - 1.2 mg/dL  ? GFR, Estimated >60 >60 mL/min  ?  Comment: (NOTE) ?Calculated using the CKD-EPI Creatinine Equation (2021) ?  ? Anion gap 8 5 - 15  ?  Comment: Performed at [facility], [address] [place], [place] [address]  ?POC CBG, ED     Status: Abnormal  ? Collection Time: 12/25/21 10:37 PM  ?Result Value Ref Range  ? Glucose-Capillary 305 (H) 70 - 99 mg/dL  ?  Comment: Glucose reference range applies only to samples taken after fasting for at least 8 hours.  ?Urinalysis, Routine w reflex microscopic     Status: Abnormal  ? Collection Time: 12/25/21 10:40 PM  ?Result Value Ref Range  ? Color, Urine YELLOW YELLOW  ? APPearance CLEAR CLEAR  ? Specific Gravity, Urine 1.015 1.005 - 1.030  ? pH 6.0 5.0 - 8.0  ? Glucose, UA >=500 (A) NEGATIVE mg/dL  ? Hgb urine dipstick NEGATIVE NEGATIVE  ? Bilirubin Urine NEGATIVE NEGATIVE  ? Ketones, ur NEGATIVE NEGATIVE mg/dL  ? Protein, ur NEGATIVE NEGATIVE mg/dL  ? Nitrite NEGATIVE NEGATIVE  ? Leukocytes,Ua NEGATIVE NEGATIVE  ? WBC, UA 0-5 0 - 5 WBC/hpf  ? Bacteria, UA NONE SEEN NONE SEEN  ? Mucus PRESENT   ?  Comment: Performed at [facility], [address] [place], [place] [address]  ?Lactic acid, plasma     Status: None  ? Collection Time: 12/25/21 10:44 PM  ?Result Value Ref Range  ? Lactic Acid, Venous 1.8 0.5 - 1.9 mmol/L  ?  Comment: Performed at [facility], [address] [place], [place] [address]  ?Blood culture (routine x 2)     Status: None (Preliminary result)  ? Collection Time: 12/25/21 10:45 PM  ? Specimen: BLOOD  ?Result Value Ref Range  ?  Specimen Description BLOOD BLOOD RIGHT WRIST   ? Special Requests    ?  BOTTLES DRAWN AEROBIC AND ANAEROBIC Blood  Culture adequate volume  ? Culture    ?  NO GROWTH < 12 HOURS ?Performed at [facility], [address], [place], [place] [address] ?  ? Report Status PENDING   ?Blood culture (routine x 2)     Status: None (Preliminary result)  ? Collection Time: 12/25/21 10:47 PM  ? Specimen: BLOOD  ?Result Value Ref Range  ? Specimen Description BLOOD RIGHT ANTECUBITAL   ? Special Requests    ?  BOTTLES DRAWN AEROBIC AND ANAEROBIC Blood Culture adequate volume  ? Culture    ?  NO GROWTH < 12 HOURS ?Performed at [facility], [address] [place], [place] [address] ?  ? Report Status PENDING   ?Lactic acid, plasma     Status: None  ? Collection Time: 12/26/21  6:47 AM  ?Result Value Ref Range  ? Lactic Acid, Venous 1.1 0.5 - 1.9 mmol/L  ?  Comment: Performed at [facility], [address], [place], [place] [address]  ? ? ?DG Knee Complete 4 Views Left ? ?Result Date: 12/26/2021 ?CLINICAL DATA:  61 year old male with infection. History of critical limb ischemia left foot status post revascularization in 2019. EXAM: LEFT KNEE - COMPLETE 4+ VIEW COMPARISON:  No prior knee series. FINDINGS: Four portable views of the left knee. Mid/distal femoral artery vascular stent is partially visible. Mild tricompartmental degenerative joint space loss and spurring at the left knee. No acute osseous abnormality identified. No definite joint effusion. No soft tissue gas identified. IMPRESSION: 1. 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 No joint effusion or acute osseous abnormality identified. 2. Left femoral artery vascular stent. Electronically Signed   By: Odessa Fleming M.D.   On: 12/26/2021 06:14   ? ?Review of Systems  ?HENT:  Negative for ear discharge, ear pain, hearing loss and tinnitus.   ?Eyes:  Negative for photophobia and pain.  ?Respiratory:  Negative for cough and  shortness of breath.   ?Cardiovascular:  Negative for chest pain.  ?Gastrointestinal:  Negative for abdominal pain, nausea and vomiting.  ?Genitourinary:  Negative for dysuria, flank pain, frequency and urgency.

## 2021-12-26 NOTE — ED Provider Notes (Signed)
?[facility] EMERGENCY [facility]MENT ?Provider Note ? ? ?CSN: [id number] ?Arrival date & time: 12/25/21  2125 ? ?  ? ?History ? ?Chief Complaint  ?Patient presents with  ? Wound Infection  ? ? ?Cody CarrelRobert K Pena is a 61 y.o. male. ? ?HPI ? ?  ? ?This is a 61 year old male who presents with redness and pain to the medial left knee.  Patient reports 1 to 2-day history of worsening pain, redness, and drainage from the medial knee.  He thought he had a bite.  He has noted purulent drainage.  He reports increasing pain.  No fevers.  Patient does have a history of diabetes and peripheral vascular disease with amputation on the right.  Reports his blood sugars have been "all over the place" since this place popped up. ? ?Chart reviewed.  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  Significant history of peripheral vascular disease and diabetes.  ? ?Home Medications ?Prior to Admission medications   ?Medication Sig Start Date End Date Taking? Authorizing Provider  ?acetaminophen (TYLENOL) 325 MG tablet Take 1-2 tablets (325-650 mg total) by mouth every 4 (four) hours as needed for mild pain. 10/05/21  Yes Angiulli, Mcarthur Rossettianiel J, PA-C  ?albuterol (VENTOLIN HFA) 108 (90 Base) MCG/ACT inhaler Inhale 2 puffs into the lungs every 6 (six) hours as needed for shortness of breath. 10/05/21  Yes Angiulli, Mcarthur Rossettianiel J, PA-C  ?ascorbic acid (VITAMIN C) 500 MG tablet Take 1 tablet (500 mg total) by mouth 2 (two) times daily. ?Patient taking differently: Take 500 mg by mouth daily. 10/05/21  Yes Angiulli, Mcarthur Rossettianiel J, PA-C  ?aspirin 81 MG EC tablet Take 1 tablet (81 mg total) by mouth daily. Swallow whole. 08/11/21 08/11/22 Yes Leonie DouglasHawken, Thomas N, MD  ?atorvastatin (LIPITOR) 80 MG tablet Take 1 tablet (80 mg total) by mouth daily. ?Patient taking differently: Take 80 mg by mouth at bedtime. 10/05/21  Yes Angiulli, Mcarthur Rossettianiel J, PA-C  ?citalopram (CELEXA) 40 MG tablet Take 40 mg by  mouth at bedtime. 09/26/21  Yes [provider]  ?clonazePAM (KLONOPIN) 0.5 MG tablet Take 1 tablet (0.5 mg total) by mouth 2 (two) times daily. 10/05/21  Yes Angiulli, Mcarthur Rossettianiel J, PA-C  ?clopidogrel (PLAVIX) 75 MG tablet Take 75 mg by mouth at bedtime. 12/07/21  Yes [provider]  ?gabapentin (NEURONTIN) 600 MG tablet Take 1 tablet (600 mg total) by mouth 3 (three) times daily. 10/05/21  Yes Angiulli, Mcarthur Rossettianiel J, PA-C  ?HUMALOG KWIKPEN 100 UNIT/ML KwikPen Inject 0-12 Units into the skin 3 (three) times daily. 12/07/21  Yes [provider]  ?LANTUS 100 UNIT/ML injection Inject 24 Units into the skin at bedtime. 12/07/21  Yes [provider]  ?metFORMIN (GLUCOPHAGE) 500 MG tablet Take 1 tablet (500 mg total) by mouth 2 (two) times daily with a meal. 10/05/21 10/05/22 Yes Angiulli, Mcarthur Rossettianiel J, PA-C  ?methocarbamol (ROBAXIN) 500 MG tablet Take 1 tablet (500 mg total) by mouth every 6 (six) hours as needed for muscle spasms. 10/05/21  Yes Angiulli, Mcarthur Rossettianiel J, PA-C  ?Multiple Vitamin (MULTIVITAMIN WITH MINERALS) TABS tablet Take 1 tablet by mouth daily. 11/17/17  Yes Darlin DropHall, Carole N, DO  ?nicotine (NICODERM CQ - DOSED IN MG/24 HOURS) 21 mg/24hr patch 21 mg patch daily x2 weeks then 14 mg patch daily x3 weeks then 7 mg patch daily x3 weeks and stop 10/05/21  Yes Angiulli, Mcarthur Rossettianiel J, PA-C  ?oxyCODONE-acetaminophen (PERCOCET) 10-325 MG tablet Take 1 tablet by  mouth every 4 (four) hours as needed for pain. 12/04/21  Yes Nadara Mustard, MD  ?Vitamin D, Ergocalciferol, (DRISDOL) 1.25 MG (50000 UNIT) CAPS capsule Take 1 capsule (50,000 Units total) by mouth every 7 (seven) days. ?Patient taking differently: Take 50,000 Units by mouth every Wednesday. 07/26/21  Yes Ghimire, Werner Lean, MD  ?citalopram (CELEXA) 20 MG tablet Take 1 tablet (20 mg total) by mouth daily. ?Patient not taking: Reported on 12/26/2021 10/05/21   Angiulli, Mcarthur Rossetti, PA-C  ?docusate sodium (COLACE) 100 MG capsule Take 1 capsule (100 mg total)  by mouth daily. ?Patient not taking: Reported on 12/26/2021 10/01/21   Nadara Mustard, MD  ?insulin glargine-yfgn (SEMGLEE) 100 UNIT/ML injection Inject 0.22 mLs (22 Units total) into the skin at bedtime. ?Patient not taking: Reported on 12/26/2021 10/05/21   Charlton Amor, PA-C  ?oxyCODONE (ROXICODONE) 15 MG immediate release tablet Take 1 tablet (15 mg total) by mouth every 4 (four) hours as needed for severe pain. ?Patient not taking: Reported on 12/26/2021 10/05/21   Charlton Amor, PA-C  ?pantoprazole (PROTONIX) 40 MG tablet Take 1 tablet (40 mg total) by mouth daily. ?Patient not taking: Reported on 12/26/2021 10/05/21   Charlton Amor, PA-C  ?polyethylene glycol (MIRALAX / GLYCOLAX) 17 g packet Take 17 g by mouth daily as needed for mild constipation. ?Patient not taking: Reported on 12/26/2021 09/20/21   Littie Deeds, MD  ?zinc sulfate 220 (50 Zn) MG capsule Take 1 capsule (220 mg total) by mouth daily. ?Patient not taking: Reported on 12/26/2021 10/05/21   Charlton Amor, PA-C  ?   ? ?Allergies    ?Ceftriaxone   ? ?Review of Systems   ?Review of Systems  ?Constitutional:  Negative for fever.  ?Skin:  Positive for color change and wound.  ?All other systems reviewed and are negative. ? ?Physical Exam ?Updated Vital Signs ?BP (!) 157/81   Pulse 74   Temp 97.9 ?F (36.6 ?C) (Oral)   Resp 16   SpO2 95%  ?Physical Exam ?Vitals and nursing note reviewed.  ?Constitutional:   ?   Appearance: He is well-developed.  ?   Comments: Chronically ill-appearing but nontoxic  ?HENT:  ?   Head: Normocephalic and atraumatic.  ?   Nose: Nose normal.  ?Eyes:  ?   Pupils: Pupils are equal, round, and reactive to light.  ?Cardiovascular:  ?   Rate and Rhythm: Normal rate and regular rhythm.  ?   Heart sounds: Normal heart sounds.  ?Pulmonary:  ?   Effort: Pulmonary effort is normal. No respiratory distress.  ?Abdominal:  ?   Palpations: Abdomen is soft.  ?   Tenderness: There is no abdominal tenderness.  ?Musculoskeletal:   ?   Cervical back: Neck supple.  ?Lymphadenopathy:  ?   Cervical: No cervical adenopathy.  ?Skin: ?   General: Skin is warm and dry.  ?   Comments: Necrotic wound over the medial aspect of the left leg with spontaneous purulent drainage, there is adjacent fluctuance and erythema  ?Neurological:  ?   Mental Status: He is alert and oriented to person, place, and time.  ?Psychiatric:     ?   Mood and Affect: Mood normal.  ? ? ? ?ED Results / Procedures / Treatments   ?Labs ?(all labs ordered are listed, but only abnormal results are displayed) ?Labs Reviewed  ?CBC WITH DIFFERENTIAL/PLATELET - Abnormal; Notable for the following components:  ?    Result Value  ? WBC 13.3 (*)   ?  Neutro Abs 9.8 (*)   ? All other components within normal limits  ?COMPREHENSIVE METABOLIC PANEL - Abnormal; Notable for the following components:  ? Glucose, Bld 286 (*)   ? BUN 6 (*)   ? Calcium 8.5 (*)   ? Total Protein 6.1 (*)   ? Albumin 3.2 (*)   ? AST 14 (*)   ? All other components within normal limits  ?URINALYSIS, ROUTINE W REFLEX MICROSCOPIC - Abnormal; Notable for the following components:  ? Glucose, UA >=500 (*)   ? All other components within normal limits  ?CBG MONITORING, ED - Abnormal; Notable for the following components:  ? Glucose-Capillary 305 (*)   ? All other components within normal limits  ?CULTURE, BLOOD (ROUTINE X 2)  ?CULTURE, BLOOD (ROUTINE X 2)  ?LACTIC ACID, PLASMA  ?LACTIC ACID, PLASMA  ? ? ?EKG ?None ? ?Radiology ?DG Knee Complete 4 Views Left ? ?Result Date: 12/26/2021 ?CLINICAL DATA:  61 year old male with infection. History of critical limb ischemia left foot status post revascularization in 2019. EXAM: LEFT KNEE - COMPLETE 4+ VIEW COMPARISON:  No prior knee series. FINDINGS: Four portable views of the left knee. Mid/distal femoral artery vascular stent is partially visible. Mild tricompartmental degenerative joint space loss and spurring at the left knee. No acute osseous abnormality identified. No definite  joint effusion. No soft tissue gas identified. IMPRESSION: 1. 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 No joint effusion or acute osseous abnormality identified. 2. Left femo

## 2021-12-26 NOTE — Progress Notes (Signed)
Pharmacy Antibiotic Note ? ?Cody Pena is a 61 y.o. male admitted on 12/25/2021 presenting with worsening knee infection, foot infection, recent BKA.  Pharmacy has been consulted for vancomycin dosing.  Received 1g vancomycin in ED ? ?Plan: ?Vancomycin 1000 mg IV x 1 to complete load, then 1000 mg IV q 8h (eAUC 486, SCr 0.96) ?Cefepime 2g IV q 8 hours ?Monitor renal function, Cx and clinical progression to narrow ?Vancomycin levels as needed ? ?  ? ?Temp (24hrs), Avg:98.1 ?F (36.7 ?C), Min:97.9 ?F (36.6 ?C), Max:98.3 ?F (36.8 ?C) ? ?Recent Labs  ?Lab 12/25/21 ?2235 12/25/21 ?2244 12/26/21 ?9798  ?WBC 13.3*  --   --   ?CREATININE 0.96  --   --   ?LATICACIDVEN  --  1.8 1.1  ?  ?CrCl cannot be calculated (Unknown ideal weight.).   ? ?Allergies  ?Allergen Reactions  ? Ceftriaxone Itching, Nausea And Vomiting and Rash  ?  Noted minutes after starting medication  ? ? ?Daylene Posey, PharmD ?Clinical Pharmacist ?ED Pharmacist Phone # [phone] ?12/26/2021 10:47 AM ? ? ?

## 2021-12-27 ENCOUNTER — Inpatient Hospital Stay (HOSPITAL_COMMUNITY): Payer: 59

## 2021-12-27 ENCOUNTER — Encounter (HOSPITAL_COMMUNITY): Payer: Self-pay | Admitting: Internal Medicine

## 2021-12-27 ENCOUNTER — Inpatient Hospital Stay (HOSPITAL_COMMUNITY): Payer: 59 | Admitting: Anesthesiology

## 2021-12-27 ENCOUNTER — Other Ambulatory Visit: Payer: Self-pay

## 2021-12-27 ENCOUNTER — Encounter (HOSPITAL_COMMUNITY)
Admission: EM | Disposition: A | Discharge status: Home / Self Care | Payer: Self-pay | Source: Home / Self Care | Attending: Internal Medicine

## 2021-12-27 DIAGNOSIS — L02416 Cutaneous abscess of left lower limb: Secondary | ICD-10-CM

## 2021-12-27 DIAGNOSIS — I10 Essential (primary) hypertension: Secondary | ICD-10-CM

## 2021-12-27 DIAGNOSIS — L03116 Cellulitis of left lower limb: Secondary | ICD-10-CM

## 2021-12-27 DIAGNOSIS — T148XXA Other injury of unspecified body region, initial encounter: Secondary | ICD-10-CM | POA: Diagnosis not present

## 2021-12-27 DIAGNOSIS — F39 Unspecified mood [affective] disorder: Secondary | ICD-10-CM | POA: Diagnosis not present

## 2021-12-27 DIAGNOSIS — F17210 Nicotine dependence, cigarettes, uncomplicated: Secondary | ICD-10-CM

## 2021-12-27 DIAGNOSIS — Z89511 Acquired absence of right leg below knee: Secondary | ICD-10-CM

## 2021-12-27 DIAGNOSIS — L089 Local infection of the skin and subcutaneous tissue, unspecified: Secondary | ICD-10-CM

## 2021-12-27 DIAGNOSIS — I739 Peripheral vascular disease, unspecified: Secondary | ICD-10-CM

## 2021-12-27 DIAGNOSIS — Z794 Long term (current) use of insulin: Secondary | ICD-10-CM

## 2021-12-27 DIAGNOSIS — E1151 Type 2 diabetes mellitus with diabetic peripheral angiopathy without gangrene: Secondary | ICD-10-CM

## 2021-12-27 DIAGNOSIS — L02415 Cutaneous abscess of right lower limb: Secondary | ICD-10-CM

## 2021-12-27 HISTORY — PX: I & D EXTREMITY: SHX5045

## 2021-12-27 HISTORY — PX: APPLICATION OF WOUND VAC: SHX5189

## 2021-12-27 LAB — CBC
HCT: 45.8 % (ref 39.0–52.0)
Hemoglobin: 15.6 g/dL (ref 13.0–17.0)
MCH: 31.5 pg (ref 26.0–34.0)
MCHC: 34.1 g/dL (ref 30.0–36.0)
MCV: 92.5 fL (ref 80.0–100.0)
Platelets: UNDETERMINED 10*3/uL (ref 150–400)
RBC: 4.95 MIL/uL (ref 4.22–5.81)
RDW: 13.5 % (ref 11.5–15.5)
WBC: 10.7 10*3/uL — ABNORMAL HIGH (ref 4.0–10.5)
nRBC: 0 % (ref 0.0–0.2)

## 2021-12-27 LAB — BLOOD CULTURE ID PANEL (REFLEXED) - BCID2

## 2021-12-27 LAB — GLUCOSE, CAPILLARY
Glucose-Capillary: 226 mg/dL — ABNORMAL HIGH (ref 70–99)
Glucose-Capillary: 192 mg/dL — ABNORMAL HIGH (ref 70–99)
Glucose-Capillary: 141 mg/dL — ABNORMAL HIGH (ref 70–99)
Glucose-Capillary: 248 mg/dL — ABNORMAL HIGH (ref 70–99)
Glucose-Capillary: 189 mg/dL — ABNORMAL HIGH (ref 70–99)

## 2021-12-27 LAB — BASIC METABOLIC PANEL
Anion gap: 9 (ref 5–15)
BUN: 6 mg/dL — ABNORMAL LOW (ref 8–23)
CO2: 28 mmol/L (ref 22–32)
Calcium: 8.6 mg/dL — ABNORMAL LOW (ref 8.9–10.3)
Chloride: 101 mmol/L (ref 98–111)
Creatinine, Ser: 0.85 mg/dL (ref 0.61–1.24)
GFR, Estimated: >60 mL/min (ref >60)
Glucose, Bld: 219 mg/dL — ABNORMAL HIGH (ref 70–99)
Potassium: 4.1 mmol/L (ref 3.5–5.1)
Sodium: 138 mmol/L (ref 135–145)

## 2021-12-27 SURGERY — IRRIGATION AND DEBRIDEMENT EXTREMITY
Anesthesia: General | Laterality: Left

## 2021-12-27 MED ORDER — METHOCARBAMOL 1000 MG/10ML IJ SOLN: 500.0000 mg | Freq: Four times a day (QID) | INTRAVENOUS | Status: DC | PRN

## 2021-12-27 MED ORDER — ENSURE ENLIVE PO LIQD
237.0000 mL | Freq: Two times a day (BID) | ORAL | Status: DC
  Administered 2021-12-28 – 2021-12-29 (×2): 237 mL via ORAL

## 2021-12-27 MED ORDER — POLYETHYLENE GLYCOL 3350 17 G PO PACK: 17.0000 g | PACK | Freq: Every day | ORAL | Status: DC | PRN

## 2021-12-27 MED ORDER — FENTANYL CITRATE (PF) 100 MCG/2ML IJ SOLN
INTRAMUSCULAR | Status: DC | PRN
  Administered 2021-12-27 (×3): 50 ug via INTRAVENOUS

## 2021-12-27 MED ORDER — HYDROMORPHONE HCL 2 MG PO TABS
2.0000 mg | ORAL_TABLET | ORAL | Status: DC | PRN
  Administered 2021-12-27: 3 mg via ORAL
  Administered 2021-12-28: 2 mg via ORAL
  Administered 2021-12-28: 3 mg via ORAL
  Filled 2021-12-27: qty 1
  Filled 2021-12-27 (×2): qty 2

## 2021-12-27 MED ORDER — ONDANSETRON HCL 4 MG PO TABS: 4.0000 mg | ORAL_TABLET | Freq: Four times a day (QID) | ORAL | Status: DC | PRN

## 2021-12-27 MED ORDER — ADULT MULTIVITAMIN W/MINERALS CH
1.0000 | ORAL_TABLET | Freq: Every day | ORAL | Status: DC
  Administered 2021-12-27 – 2021-12-29 (×3): 1 via ORAL
  Filled 2021-12-27 (×3): qty 1

## 2021-12-27 MED ORDER — METOCLOPRAMIDE HCL 5 MG PO TABS: 5.0000 mg | ORAL_TABLET | Freq: Three times a day (TID) | ORAL | Status: DC | PRN

## 2021-12-27 MED ORDER — DOCUSATE SODIUM 100 MG PO CAPS
100.0000 mg | ORAL_CAPSULE | Freq: Two times a day (BID) | ORAL | Status: DC
  Administered 2021-12-27 – 2021-12-29 (×4): 100 mg via ORAL
  Filled 2021-12-27 (×5): qty 1

## 2021-12-27 MED ORDER — EPHEDRINE SULFATE-NACL 50-0.9 MG/10ML-% IV SOSY
PREFILLED_SYRINGE | INTRAVENOUS | Status: DC | PRN
  Administered 2021-12-27 (×2): 5 mg via INTRAVENOUS

## 2021-12-27 MED ORDER — SODIUM CHLORIDE 0.9 % IV SOLN: INTRAVENOUS | Status: DC

## 2021-12-27 MED ORDER — HYDROMORPHONE HCL 1 MG/ML IJ SOLN: 0.2500 mg | INTRAMUSCULAR | Status: DC | PRN

## 2021-12-27 MED ORDER — OXYCODONE HCL 5 MG PO TABS
5.0000 mg | ORAL_TABLET | ORAL | Status: DC | PRN
  Administered 2021-12-27 – 2021-12-29 (×7): 10 mg via ORAL
  Filled 2021-12-27 (×7): qty 2

## 2021-12-27 MED ORDER — OXYCODONE HCL 5 MG/5ML PO SOLN: 5.0000 mg | Freq: Once | ORAL | Status: DC | PRN

## 2021-12-27 MED ORDER — PROPOFOL 10 MG/ML IV BOLUS
INTRAVENOUS | Status: DC | PRN
  Administered 2021-12-27: 200 mg via INTRAVENOUS

## 2021-12-27 MED ORDER — AMISULPRIDE (ANTIEMETIC) 5 MG/2ML IV SOLN: 10.0000 mg | Freq: Once | INTRAVENOUS | Status: DC | PRN

## 2021-12-27 MED ORDER — LIDOCAINE 2% (20 MG/ML) 5 ML SYRINGE
INTRAMUSCULAR | Status: AC
  Filled 2021-12-27: qty 10

## 2021-12-27 MED ORDER — ONDANSETRON HCL 4 MG/2ML IJ SOLN
INTRAMUSCULAR | Status: DC | PRN
  Administered 2021-12-27: 4 mg via INTRAVENOUS

## 2021-12-27 MED ORDER — PROMETHAZINE HCL 25 MG/ML IJ SOLN: 6.2500 mg | INTRAMUSCULAR | Status: DC | PRN

## 2021-12-27 MED ORDER — JUVEN PO PACK
1.0000 | PACK | Freq: Two times a day (BID) | ORAL | Status: DC
  Administered 2021-12-27 – 2021-12-29 (×4): 1 via ORAL
  Filled 2021-12-27 (×4): qty 1

## 2021-12-27 MED ORDER — FENTANYL CITRATE (PF) 250 MCG/5ML IJ SOLN
INTRAMUSCULAR | Status: AC
  Filled 2021-12-27: qty 5

## 2021-12-27 MED ORDER — 0.9 % SODIUM CHLORIDE (POUR BTL) OPTIME
TOPICAL | Status: DC | PRN
  Administered 2021-12-27: 1000 mL

## 2021-12-27 MED ORDER — HYDROMORPHONE HCL 1 MG/ML IJ SOLN
0.5000 mg | INTRAMUSCULAR | Status: DC | PRN
  Administered 2021-12-27 – 2021-12-28 (×2): 1 mg via INTRAVENOUS
  Filled 2021-12-27 (×2): qty 1

## 2021-12-27 MED ORDER — ONDANSETRON HCL 4 MG/2ML IJ SOLN: 4.0000 mg | Freq: Four times a day (QID) | INTRAMUSCULAR | Status: DC | PRN

## 2021-12-27 MED ORDER — METHOCARBAMOL 500 MG PO TABS
500.0000 mg | ORAL_TABLET | Freq: Four times a day (QID) | ORAL | Status: DC | PRN
  Administered 2021-12-27 – 2021-12-28 (×3): 500 mg via ORAL
  Filled 2021-12-27 (×3): qty 1

## 2021-12-27 MED ORDER — LIDOCAINE 2% (20 MG/ML) 5 ML SYRINGE
INTRAMUSCULAR | Status: DC | PRN
  Administered 2021-12-27: 100 mg via INTRAVENOUS

## 2021-12-27 MED ORDER — LACTATED RINGERS IV SOLN: INTRAVENOUS | Status: DC

## 2021-12-27 MED ORDER — MIDAZOLAM HCL 2 MG/2ML IJ SOLN
INTRAMUSCULAR | Status: AC
  Filled 2021-12-27: qty 2

## 2021-12-27 MED ORDER — PROPOFOL 10 MG/ML IV BOLUS
INTRAVENOUS | Status: AC
  Filled 2021-12-27: qty 20

## 2021-12-27 MED ORDER — MIDAZOLAM HCL 5 MG/5ML IJ SOLN
INTRAMUSCULAR | Status: DC | PRN
Start: 2021-12-27 — End: 2021-12-27
  Administered 2021-12-27: 2 mg via INTRAVENOUS

## 2021-12-27 MED ORDER — ACETAMINOPHEN 325 MG PO TABS
325.0000 mg | ORAL_TABLET | Freq: Four times a day (QID) | ORAL | Status: DC | PRN
  Administered 2021-12-27: 650 mg via ORAL
  Filled 2021-12-27: qty 2

## 2021-12-27 MED ORDER — ONDANSETRON HCL 4 MG/2ML IJ SOLN
INTRAMUSCULAR | Status: AC
  Filled 2021-12-27: qty 8

## 2021-12-27 MED ORDER — OXYCODONE HCL 5 MG PO TABS: 5.0000 mg | ORAL_TABLET | Freq: Once | ORAL | Status: DC | PRN

## 2021-12-27 MED ORDER — PHENYLEPHRINE 80 MCG/ML (10ML) SYRINGE FOR IV PUSH (FOR BLOOD PRESSURE SUPPORT)
PREFILLED_SYRINGE | INTRAVENOUS | Status: AC
  Filled 2021-12-27: qty 10

## 2021-12-27 MED ORDER — CHLORHEXIDINE GLUCONATE 0.12 % MT SOLN: 15.0000 mL | Freq: Once | OROMUCOSAL | Status: AC

## 2021-12-27 MED ORDER — ORAL CARE MOUTH RINSE: 15.0000 mL | Freq: Once | OROMUCOSAL | Status: AC

## 2021-12-27 MED ORDER — CHLORHEXIDINE GLUCONATE 0.12 % MT SOLN
OROMUCOSAL | Status: AC
  Administered 2021-12-27: 15 mL via OROMUCOSAL
  Filled 2021-12-27: qty 15

## 2021-12-27 MED ORDER — PHENYLEPHRINE 80 MCG/ML (10ML) SYRINGE FOR IV PUSH (FOR BLOOD PRESSURE SUPPORT)
PREFILLED_SYRINGE | INTRAVENOUS | Status: DC | PRN
  Administered 2021-12-27 (×2): 80 ug via INTRAVENOUS

## 2021-12-27 MED ORDER — METOCLOPRAMIDE HCL 5 MG/ML IJ SOLN: 5.0000 mg | Freq: Three times a day (TID) | INTRAMUSCULAR | Status: DC | PRN

## 2021-12-27 MED ORDER — BISACODYL 10 MG RE SUPP: 10.0000 mg | Freq: Every day | RECTAL | Status: DC | PRN

## 2021-12-27 MED ORDER — INSULIN GLARGINE-YFGN 100 UNIT/ML ~~LOC~~ SOLN
28.0000 [IU] | Freq: Every day | SUBCUTANEOUS | Status: DC
  Administered 2021-12-28 (×2): 28 [IU] via SUBCUTANEOUS
  Filled 2021-12-27 (×4): qty 0.28

## 2021-12-27 SURGICAL SUPPLY — 44 items
BAG COUNTER SPONGE SURGICOUNT (BAG) IMPLANT
BLADE SURG 15 STRL LF DISP TIS (BLADE) IMPLANT
BLADE SURG 15 STRL SS (BLADE) ×1
BLADE SURG 21 STRL SS (BLADE) ×3 IMPLANT
BNDG COHESIVE 6X5 TAN STRL LF (GAUZE/BANDAGES/DRESSINGS) ×1 IMPLANT
BNDG GAUZE ELAST 4 BULKY (GAUZE/BANDAGES/DRESSINGS) ×4 IMPLANT
CANISTER WOUND CARE 500ML ATS (WOUND CARE) ×1 IMPLANT
CANISTER WOUNDNEG PRESSURE 500 (CANNISTER) ×1 IMPLANT
COVER SURGICAL LIGHT HANDLE (MISCELLANEOUS) ×6 IMPLANT
DRAPE INCISE IOBAN 66X45 STRL (DRAPES) ×1 IMPLANT
DRAPE U-SHAPE 47X51 STRL (DRAPES) ×3 IMPLANT
DRESSING PEEL AND PLAC PRVNA20 (GAUZE/BANDAGES/DRESSINGS) IMPLANT
DRESSING VERAFLO CLEANS CC MED (GAUZE/BANDAGES/DRESSINGS) IMPLANT
DRSG ADAPTIC 3X8 NADH LF (GAUZE/BANDAGES/DRESSINGS) ×2 IMPLANT
DRSG PEEL AND PLACE PREVENA 20 (GAUZE/BANDAGES/DRESSINGS) ×3
DRSG VERAFLO CLEANSE CC MED (GAUZE/BANDAGES/DRESSINGS) ×3
DURAPREP 26ML APPLICATOR (WOUND CARE) ×3 IMPLANT
ELECT REM PT RETURN 9FT ADLT (ELECTROSURGICAL)
ELECTRODE REM PT RTRN 9FT ADLT (ELECTROSURGICAL) IMPLANT
GAUZE SPONGE 4X4 12PLY STRL (GAUZE/BANDAGES/DRESSINGS) ×2 IMPLANT
GLOVE BIOGEL PI IND STRL 9 (GLOVE) ×2 IMPLANT
GLOVE BIOGEL PI INDICATOR 9 (GLOVE) ×1
GLOVE SURG ORTHO 9.0 STRL STRW (GLOVE) ×3 IMPLANT
GOWN STRL REUS W/ TWL XL LVL3 (GOWN DISPOSABLE) ×4 IMPLANT
GOWN STRL REUS W/TWL XL LVL3 (GOWN DISPOSABLE) ×2
GRAFT SKIN WND SURGIBIND 7X20 (Tissue) ×1 IMPLANT
GRAFT SKIN WND SURGICLOSE M95 (Tissue) ×1 IMPLANT
HANDPIECE INTERPULSE COAX TIP (DISPOSABLE)
KIT BASIN OR (CUSTOM PROCEDURE TRAY) ×3 IMPLANT
KIT TURNOVER KIT B (KITS) ×3 IMPLANT
MANIFOLD NEPTUNE II (INSTRUMENTS) ×3 IMPLANT
NS IRRIG 1000ML POUR BTL (IV SOLUTION) ×3 IMPLANT
PACK ORTHO EXTREMITY (CUSTOM PROCEDURE TRAY) ×3 IMPLANT
PAD ARMBOARD 7.5X6 YLW CONV (MISCELLANEOUS) ×6 IMPLANT
PENCIL BUTTON HOLSTER BLD 10FT (ELECTRODE) ×1 IMPLANT
SET HNDPC FAN SPRY TIP SCT (DISPOSABLE) IMPLANT
SPONGE T-LAP 18X18 ~~LOC~~+RFID (SPONGE) ×1 IMPLANT
STOCKINETTE IMPERVIOUS 9X36 MD (GAUZE/BANDAGES/DRESSINGS) ×1 IMPLANT
SUT ETHILON 2 0 PSLX (SUTURE) ×4 IMPLANT
SWAB COLLECTION DEVICE MRSA (MISCELLANEOUS) ×2 IMPLANT
SWAB CULTURE ESWAB REG 1ML (MISCELLANEOUS) IMPLANT
TOWEL GREEN STERILE (TOWEL DISPOSABLE) ×3 IMPLANT
TUBE CONNECTING 12X1/4 (SUCTIONS) ×3 IMPLANT
YANKAUER SUCT BULB TIP NO VENT (SUCTIONS) ×3 IMPLANT

## 2021-12-27 NOTE — Plan of Care (Signed)

## 2021-12-27 NOTE — Progress Notes (Signed)
?PROGRESS NOTE ? ? ? ?SPIKE DESILETS  VQM:[id number] DOB: [birth year] DOA: 12/25/2021 ?PCP: Pcp, No  ? 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  ? ? Recently he states that he developed a pustule on his left arm about a week or 2 ago.  He put heat on it and it came to a head and he squeezed it and he started developing adjacent pustule in the area but is now doing okay. Approximately 3 days ago however he developed another lesion on his left medial knee he thought he may have been a spider bite but it got bigger and more painful and is draining some more purulent fluid.  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   ? ?In the ED there was concern for spider bite with necrotic wound infection and he was given IV vancomycin and orthopedic surgery and vascular surgery were consulted.  Orthopedic surgery was consulted and they were not initially about a septic joint or bursa but did get a CT to rule out deeper abscess that would warrant surgical intervention.  Given his abnormal ABI vascular surgery was consulted and they are planning on obtaining vein mapping and ABIs in case he ultimately requires a femoral to popliteal artery bypass but currently they feel that he has adequate circulation of the level of need to heal the wound and agree with IV antibiotics and will only intervene if the wound did not improve with IV antibiotics and local wound care. ? ?Orthopedic surgery Dr. Meridee Score was consulted and reviewed the CT scan and has showed a medial ulcer with a large area of edema which was felt to be a large abscess medial left knee and is planning for surgical  intervention today for excisional debridement of the left knee abscess and placement of wound VAC and possible tissue graft.  ? ? ?Assessment and Plan: ? ?Left leg wound/infection with large abscess on the medial left knee with peripheral vascular disease ?Small left forearm abscess ?-Appears concerning for MRSA folliculitis with extension ?-No systemic symptoms but there is streaking erythema present ?-Also with callus formation and possible nidus for infection along the left plantar foot ?-Negative lactate (went from 1.8 and trended down to 1.1), no current concerns for sepsis but he did have a leukocytosis which is slowly trending down and went from 13.3 is now 10.7 ?-CRP was elevated at 4.4 and ESR was 22 ?-C/w IV antibiotics (Cefepime/Vanc after discussion with pharmacy) ?-Orthopedics will consult, plan for excisional debridement of the left knee abscess and placement of wound VAC and possible tissue graft ?-I have ordered ABIs in case revascularization may be indicated ?-Patient is NPO after midnight 12/27/2021 ?-LE wound order set utilized including labs (CRP, ESR, A1c, prealbumin, HIV, and blood cultures) and consults (diabetes coordinator; peripheral vascular navigator; TOC team; wound care; and nutrition)  ?-Vascular surgery consulted as below ?-WOC nurse was also consulted and made some recommendations ?-Continue supportive care and he is on IV normal saline at 75 MLS per hour ?-Pain control with IV morphine 2 mg every 2 hours as needed severe pain and he also has methocarbamol 500 mg p.o. every 6.  For muscle spasm, also has oxycodone 5 mg p.o. every 4 as needed for moderate pain ?-Bowel regimen with MiraLAX 17 g p.o. daily as needed ?  ?PVD ?-s/p recent BKA ?-Continue ASA 81 mg p.o. daily, Clopidogrel 75 mg p.o. nightly, atorvastatin 80 mg p.o. nightly ?-Vascular surgery is consulting and they agree with IV antibiotics.  They feel if the wound did not continue to improve with IV antibiotics and local  wound care ?Option from a vascular standpoint would be a femoral to popliteal artery bypass and they do not think this is likely however they can obtain a vein mapping ABIs and have in case he ultimately does require bypass ?  ?DM ?-Recent A1c was 9.3, indicating poor control ?-Continue Lantus substitution with Semglee 24 units nightly we will increase to 28 units daily and with moderate NovoLog sliding scale insulin before meals and at bedtime ?-Hold Home Metformin ?-Continue Gabapentin 6 mg p.o. 3 times daily ?-Continue monitor CBGs carefully; CBGs ranging from 146-226 ?  ?COPD with ongoing tobacco use ?Tobacco abuse ?-Continue Albuterol 2.5 mg IH every 6 as needed shortness of breath ?-Denies h/o COPD but on Albuterol and has clubbing of fingers ?-Encourage cessation.   ?-This was discussed with the patient and should be reviewed on an ongoing basis.   ?-Patch ordered  ?  ?Mood disorder ?Depression and anxiety ?-Continue Citalopram 40 mg p.o. nightly and Clonazepam 0.5 mg p.o. twice daily ? ?Hypertension ?-Continue to monitor blood pressures per protocol ?-Last blood pressure reading was 129/75 but got as high as 165/80 ?-Continue with 5 mg IV every 4 as needed for high blood pressure ? ?Leukocytosis ?-In the setting of above ?-WBC is trending down and went from 13.3 is now 10.7 ?-Continue to monitor and trend as patient is on antibiotics ?-Repeat CBC in a.m. ? ?DVT prophylaxis: enoxaparin (LOVENOX) injection 40 mg Start: 12/26/21 1100 ? ?  Code Status: Full Code ?Family Communication: No family present at bedside ? ?Disposition Plan:  ?Level of care: Med-Surg ?Status is: Inpatient ?Remains inpatient appropriate because: Going for Surgical Intervention ?  ?Consultants:  ?Vascular Surgery ?Orthopedic Surgery  ? ?Procedures:  ?ABI's and Vein Mapping to be done ?OR today for Excisional debridement of the left knee abscess placement of a wound VAC possible tissue graft ? ?Antimicrobials:  ?Anti-infectives (From  admission, onward)  ? ? Start     Dose/Rate Route Frequency Ordered Stop  ? 12/27/21 0600  ceFAZolin (ANCEF) IVPB 2g/100 mL premix       ? 2 g ?200 mL/hr over 30 Minutes Intravenous On call to O.R. 12/26/21 1927 12/28/21 0559  ? 12/26/21 2200  [MAR Hold]  vancomycin (VANCOCIN) IVPB 1000 mg/200 mL premix        (MAR Hold since Wed 12/27/2021 at 0944.Hold Reason: Transfer to a Procedural area)  ? 1,000 mg ?200 mL/hr over 60 Minutes Intravenous Every 8 hours 12/26/21 1106    ? 12/26/21 1100  vancomycin (VANCOCIN) IVPB 1000 mg/200 mL premix       ? 1,000 mg ?200 mL/hr over 60 Minutes Intravenous  Once 12/26/21 1058 12/26/21 1655  ? 12/26/21 1100  [MAR Hold]  ceFEPIme (MAXIPIME) 2 g in sodium chloride 0.9 % 100 mL IVPB        (MAR Hold since Wed 12/27/2021 at 0944.Hold Reason: Transfer to a Procedural area)  ? 2 g ?200 mL/hr over 30 Minutes Intravenous Every 8 hours 12/26/21 1058    ? 12/26/21 0600  vancomycin (VANCOCIN) IVPB 1000 mg/200  mL premix       ? 1,000 mg ?200 mL/hr over 60 Minutes Intravenous  Once 12/26/21 0548 12/26/21 0751  ? ?  ?  ?Subjective: ?Seen and examined and he was doing ok and complaining of Left Knee pain. No nausea or vomiting. Denies any lightheadedness or dizziness. No other concerns or complaints at this time.  ? ?Objective: ?Vitals:  ? 12/26/21 2100 12/27/21 0532 12/27/21 0753 12/27/21 0947  ?BP:  (!) 142/77 132/71 129/75  ?Pulse:  83 72 79  ?Resp:  18 17 18   ?Temp:  98.3 ?F (36.8 ?C) 97.6 ?F (36.4 ?C) 98.2 ?F (36.8 ?C)  ?TempSrc:   Oral Oral  ?SpO2:  96% 95% 93%  ?Weight: 101.2 kg     ?Height: 6' 4"  (1.93 m)     ? ? ?Intake/Output Summary (Last 24 hours) at 12/27/2021 1107 ?Last data filed at 12/27/2021 0700 ?Gross per 24 hour  ?Intake 630.71 ml  ?Output 3300 ml  ?Net -2669.29 ml  ? ?Filed Weights  ? 12/26/21 2100  ?Weight: 101.2 kg  ? ?Examination: ?Physical Exam: ? ?Constitutional: WN/WD overweight Caucasian male in NAD and appears a little uncomfortable  ?Respiratory: Diminished to  auscultation bilaterally, no wheezing, rales, rhonchi or crackles. Normal respiratory effort and patient is not tachypenic. No accessory muscle use.  ?Cardiovascular: RRR, no murmurs / rubs / gallops. S1 and S2 auscultat

## 2021-12-27 NOTE — Anesthesia Preprocedure Evaluation (Signed)
Anesthesia Evaluation  ?Patient identified by MRN, date of birth, ID band ?Patient awake ? ? ? ?Reviewed: ?Allergy & Precautions, NPO status , Patient's Chart, lab work & pertinent test results ? ?Airway ?Mallampati: II ? ? ? ? ? ? Dental ? ?(+) Poor Dentition, Missing, Loose, Dental Advisory Given ?  ?Pulmonary ?Current Smoker and Patient abstained from smoking.,  ?  ?Pulmonary exam normal ?breath sounds clear to auscultation ? ? ? ? ? ? Cardiovascular ?hypertension, Pt. on medications ?+ Peripheral Vascular Disease  ?Normal cardiovascular exam ?Rhythm:Regular Rate:Normal ? ? ?  ?Neuro/Psych ?PSYCHIATRIC DISORDERS Depression Diabetic peripheral neuropathy ? Neuromuscular disease   ? GI/Hepatic ?negative GI ROS, Neg liver ROS,   ?Endo/Other  ?diabetes, Poorly Controlled, Type 2, Oral Hypoglycemic Agents, Insulin DependentHyperlipidemia ? Renal/GU ?negative Renal ROS  ?negative genitourinary ?  ?Musculoskeletal ? ?(+) Arthritis , Osteoarthritis,  Osteomyelitis right foot ?S/P ray amputation left foot  ? Abdominal ?  ?Peds ? Hematology ? ?(+) Blood dyscrasia, anemia , Plavix therapy- last dose   ?Anesthesia Other Findings ? ? Reproductive/Obstetrics ? ?  ? ? ? ? ? ? ? ? ? ? ? ? ? ?  ?  ? ? ? ? ? ? ? ? ?Anesthesia Physical ? ?Anesthesia Plan ? ?ASA: 3 ? ?Anesthesia Plan: General  ? ?Post-op Pain Management: Gabapentin PO (pre-op)* and Dilaudid IV  ? ?Induction: Intravenous ? ?PONV Risk Score and Plan: 1 and Treatment may vary due to age or medical condition and Ondansetron ? ?Airway Management Planned: LMA ? ?Additional Equipment:  ? ?Intra-op Plan:  ? ?Post-operative Plan: Extubation in OR ? ?Informed Consent: I have reviewed the patients History and Physical, chart, labs and discussed the procedure including the risks, benefits and alternatives for the proposed anesthesia with the patient or authorized representative who has indicated his/her understanding and acceptance.   ? ? ? ?Dental advisory given ? ?Plan Discussed with: CRNA and Anesthesiologist ? ?Anesthesia Plan Comments:   ? ? ? ? ? ? ?Anesthesia Quick Evaluation ? ?

## 2021-12-27 NOTE — Progress Notes (Signed)
PHARMACY - PHYSICIAN COMMUNICATION ?CRITICAL VALUE ALERT - BLOOD CULTURE IDENTIFICATION (BCID) ? ?Cody Pena is an 61 y.o. male who presented to [facility] on 12/25/2021 with a chief complaint of wound infection. ? ?Assessment:  Started on broad-spectrum ABX for plan to go to OR for debridement, now growing Staph spp in 1 of 3 blood cx bottles, likely a contaminant though fully covered with current ABX. ? ?Name of physician (or Provider) ContactedVella Kohler NP ? ?Current antibiotics: vancomycin and cefepime ? ?Changes to prescribed antibiotics recommended:  ?No changes needed ? ?Results for orders placed or performed during the hospital encounter of 12/25/21  ?Blood Culture ID Panel (Reflexed) (Collected: 12/25/2021 10:47 PM)  ?Result Value Ref Range  ? Enterococcus faecalis NOT DETECTED NOT DETECTED  ? Enterococcus Faecium NOT DETECTED NOT DETECTED  ? Listeria monocytogenes NOT DETECTED NOT DETECTED  ? Staphylococcus species DETECTED (A) NOT DETECTED  ? Staphylococcus aureus (BCID) NOT DETECTED NOT DETECTED  ? Staphylococcus epidermidis NOT DETECTED NOT DETECTED  ? Staphylococcus lugdunensis NOT DETECTED NOT DETECTED  ? Streptococcus species NOT DETECTED NOT DETECTED  ? Streptococcus agalactiae NOT DETECTED NOT DETECTED  ? Streptococcus pneumoniae NOT DETECTED NOT DETECTED  ? Streptococcus pyogenes NOT DETECTED NOT DETECTED  ? A.calcoaceticus-baumannii NOT DETECTED NOT DETECTED  ? Bacteroides fragilis NOT DETECTED NOT DETECTED  ? Enterobacterales NOT DETECTED NOT DETECTED  ? Enterobacter cloacae complex NOT DETECTED NOT DETECTED  ? Escherichia coli NOT DETECTED NOT DETECTED  ? Klebsiella aerogenes NOT DETECTED NOT DETECTED  ? Klebsiella oxytoca NOT DETECTED NOT DETECTED  ? Klebsiella pneumoniae NOT DETECTED NOT DETECTED  ? Proteus species NOT DETECTED NOT DETECTED  ? Salmonella species NOT DETECTED NOT DETECTED  ? Serratia marcescens NOT DETECTED NOT DETECTED  ? Haemophilus influenzae NOT DETECTED NOT DETECTED   ? Neisseria meningitidis NOT DETECTED NOT DETECTED  ? Pseudomonas aeruginosa NOT DETECTED NOT DETECTED  ? Stenotrophomonas maltophilia NOT DETECTED NOT DETECTED  ? Candida albicans NOT DETECTED NOT DETECTED  ? Candida auris NOT DETECTED NOT DETECTED  ? Candida glabrata NOT DETECTED NOT DETECTED  ? Candida krusei NOT DETECTED NOT DETECTED  ? Candida parapsilosis NOT DETECTED NOT DETECTED  ? Candida tropicalis NOT DETECTED NOT DETECTED  ? Cryptococcus neoformans/gattii NOT DETECTED NOT DETECTED  ? ? ?Vernard Gambles, PharmD, BCPS  ?12/27/2021  5:05 AM ? ?

## 2021-12-27 NOTE — Anesthesia Procedure Notes (Signed)
Procedure Name: LMA Insertion ?Date/Time: 12/27/2021 11:43 AM ?Performed by: Gwyndolyn Saxon, CRNA ?Pre-anesthesia Checklist: Patient identified, Emergency Drugs available, Suction available and Patient being monitored ?Patient Re-evaluated:Patient Re-evaluated prior to induction ?Oxygen Delivery Method: Circle system utilized ?Preoxygenation: Pre-oxygenation with 100% oxygen ?Induction Type: IV induction ?Ventilation: Mask ventilation without difficulty and Oral airway inserted - appropriate to patient size ?LMA: LMA inserted ?LMA Size: 4.0 ?Number of attempts: 1 ?Placement Confirmation: positive ETCO2 and breath sounds checked- equal and bilateral ?Tube secured with: Tape ?Dental Injury: Teeth and Oropharynx as per pre-operative assessment  ? ? ? ? ?

## 2021-12-27 NOTE — Transfer of Care (Signed)
Immediate Anesthesia Transfer of Care Note ? ?Patient: Cody Pena ? ?Procedure(s) Performed: LEFT LEG DEBRIDEMENT ULCERS (Left) ?APPLICATION OF WOUND VAC ? ?Patient Location: PACU ? ?Anesthesia Type:General ? ?Level of Consciousness: drowsy ? ?Airway & Oxygen Therapy: Patient Spontanous Breathing and Patient connected to face mask oxygen ? ?Post-op Assessment: Report given to RN and Post -op Vital signs reviewed and stable ? ?Post vital signs: Reviewed and stable ? ?Last Vitals:  ?Vitals Value Taken Time  ?BP 94/61 12/27/21 1223  ?Temp    ?Pulse 70 12/27/21 1225  ?Resp 9 12/27/21 1225  ?SpO2 100 % 12/27/21 1225  ?Vitals shown include unvalidated device data. ? ?Last Pain:  ?Vitals:  ? 12/27/21 0956  ?TempSrc:   ?PainSc: 0-No pain  ?   ? ?  ? ?Complications: No notable events documented. ?

## 2021-12-27 NOTE — Hospital Course (Addendum)
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    Recently he states that he developed a pustule on his left arm about a week or 2 ago.  He put heat on it and it came to a head and he squeezed it and he started developing adjacent pustule in the area but is now doing okay. Approximately 3 days ago however he developed another lesion on his left medial knee he thought he may have been a spider bite but it got bigger and more painful and is draining some more purulent fluid.  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    In the ED there was concern for spider bite with necrotic wound infection and he was given IV vancomycin and orthopedic surgery and vascular surgery were consulted.  Orthopedic surgery was consulted and they were not initially about a septic joint or bursa but did get a CT to rule out deeper abscess that would warrant surgical intervention.  Given his abnormal ABI vascular surgery was consulted and they are planning on obtaining vein mapping and ABIs in case he ultimately requires a femoral to popliteal artery bypass but currently they feel that he has adequate circulation of the level of need to heal the wound and agree with IV antibiotics and will only intervene if the wound did not improve with IV antibiotics and local wound care.  Orthopedic surgery Dr. Aldean Baker was consulted and reviewed the CT scan and has showed a medial ulcer with a large area of edema which was felt to be a large abscess medial left knee and took the patient for for surgical intervention on 12/27/21 for excisional debridement of the left knee abscess and placement of wound VAC and Kerecis  tissue graft and he is POD1.  He is complaining of Pain 5/10 and awaiting Abscess Cx. It currently is growing Rare Gram Postive Cocci with Cx being Staph Aureus; Of Note 1/2 Blood Cx + for Staph Species and was Staph Saprophyticus.  Cultures finally came back and grew out MRSA and this was sensitive to doxycycline.  ID was consulted for further evaluation and recommended changing this to doxycycline following up in the ID clinic in 2 weeks.  Patient was then medically stable to be discharged home with a wound VAC and will need home health PT and follow-up with PCP, ID as well as orthopedic surgery in outpatient setting

## 2021-12-27 NOTE — Progress Notes (Signed)
Orders received for ABI and bilateral lower extremity vein mapping, however patient currently has left sided wound vac, in addition to extensive wrapping of the left lower extremity. Will attempt exams once legs are accessible. ? ?12/27/21 2:56 PM ?Olen Cordial RVT  ?

## 2021-12-27 NOTE — Progress Notes (Signed)
Inpatient Diabetes Program Recommendations ? ?AACE/ADA: New Consensus Statement on Inpatient Glycemic Control (2015) ? ?Target Ranges:  Prepandial:   less than 140 mg/dL ?     Peak postprandial:   less than 180 mg/dL (1-2 hours) ?     Critically ill patients:  140 - 180 mg/dL  ? ?Lab Results  ?Component Value Date  ? GLUCAP 192 (H) 12/27/2021  ? HGBA1C 9.2 (H) 09/27/2021  ? ? ?Review of Glycemic Control ? Latest Reference Range & Units 12/25/21 22:37 12/26/21 11:56 12/26/21 15:27 12/26/21 19:43 12/27/21 07:55 12/27/21 09:27  ?Glucose-Capillary 70 - 99 mg/dL 195 (H) 093 (H) 267 (H) 188 (H) 226 (H) 192 (H)  ? ?Diabetes history: DM 2 ?Outpatient Diabetes medications: Lantus 24 units qhs, Metformin 500 mg bid, Humalog 0-12 units tid ?Current orders for Inpatient glycemic control:  ?None in ED being evaluated ? ?Inpatient Diabetes Program Recommendations:   ? ?-  Consider increasing Semglee to 28 units Daily ? ? ?Thanks, ? ?Christena Deem RN, MSN, BC-ADM ?Inpatient Diabetes Coordinator ?Team Pager [phone] (8a-5p) ?

## 2021-12-27 NOTE — Progress Notes (Signed)
Initial Nutrition Assessment ? ?DOCUMENTATION CODES:  ? ?Non-severe (moderate) malnutrition in context of acute illness/injury ? ?INTERVENTION:  ? ?Ensure Enlive po BID, each supplement provides 350 kcal and 20 grams of protein. ? ?MVI with minerals daily. ? ?Juven 1 packet PO BID, each packet provides 80 calories, 8 grams of carbohydrate, 2.5  grams of protein (collagen), 7 grams of L-arginine and 7 grams of L-glutamine; supplement contains CaHMB, Vitamins C, E, B12 and Zinc to promote wound healing ? ? ?NUTRITION DIAGNOSIS:  ? ?Moderate Malnutrition related to acute illness (PVD with L knee abscess) as evidenced by mild fat depletion, mild muscle depletion. ? ?GOAL:  ? ?Patient will meet greater than or equal to 90% of their needs ? ?MONITOR:  ? ?PO intake, Supplement acceptance, Skin ? ?REASON FOR ASSESSMENT:  ? ?Consult ?Wound healing ? ?ASSESSMENT:  ? ?61 yo male admitted with worsening draining abscess on L knee. PMH includes depression, DM, HTN, PVD, R BKA, L toe amputation. ? ?NPO this morning for procedure. ?S/P excisional debridement of L knee abscess, tissue graft, and VAC placement today.  ? ?Patient reports that he has lost weight, but unsure of exact amount. He typically eats well, but has had a decreased appetite and poor intake over the past month. He is hungry now because he was NPO this morning. He was preparing to eat his lunch during RD visit. He likes chocolate Ensure and agreed to drink 2 per day to help maximize protein intake support wound healing. ? ?Labs reviewed. Prealbumin 17.8 (slightly low d/t infection and acute stress response) ?CBG: 226 this AM ? ?Medications reviewed and include Colace, Novolog, Semglee. ?IVF: NS at 75 ml/h ? ?Weight is down by 4% over the past few months, likely r/t recent BKA. Difficult to determine actual weight loss d/t current edema and recent amputation.  ? ?NUTRITION - FOCUSED PHYSICAL EXAM: ? ?Flowsheet Row Most Recent Value  ?Orbital Region Mild depletion   ?Upper Arm Region Moderate depletion  ?Thoracic and Lumbar Region Mild depletion  ?Buccal Region Mild depletion  ?Temple Region Mild depletion  ?Clavicle Bone Region Mild depletion  ?Clavicle and Acromion Bone Region Mild depletion  ?Scapular Bone Region Mild depletion  ?Dorsal Hand Mild depletion  ?Patellar Region Unable to assess  ?Anterior Thigh Region Unable to assess  ?Posterior Calf Region Unable to assess  ?Edema (RD Assessment) Unable to assess  ?Hair Reviewed  ?Eyes Reviewed  ?Mouth Reviewed  ?Skin Reviewed  ?Nails Reviewed  ? ?  ? ? ?Diet Order:   ?Diet Order   ? ?       ?  Diet Carb Modified Fluid consistency: Thin; Room service appropriate? Yes  Diet effective now       ?  ? ?  ?  ? ?  ? ? ?EDUCATION NEEDS:  ? ?Not appropriate for education at this time ? ?Skin:  Skin Assessment: Skin Integrity Issues: ?Skin Integrity Issues:: Other (Comment) ?Other: L knee abscess ? ?Last BM:  5/15 ? ?Height:  ? ?Ht Readings from Last 1 Encounters:  ?12/26/21 6\' 4"  (1.93 m)  ? ? ?Weight:  ? ?Wt Readings from Last 1 Encounters:  ?12/26/21 101.2 kg  ? ? ?Ideal Body Weight:  86 kg ? ?BMI:  Body mass index is 29.06 kg/m? (adjusted for BKA). ? ?Estimated Nutritional Needs:  ? ?Kcal:  2400-2600 ? ?Protein:  140-160 gm ? ?Fluid:  2.4-2.6 L ? ? ? ?12/28/21 RD, LDN, CNSC ?Please refer to Amion for contact information.                                                       ? ?

## 2021-12-27 NOTE — Progress Notes (Signed)
PT Cancellation Note ? ?Patient Details ?Name: ORVIN NETTER ?MRN: [id number] ?DOB: Apr 01, 1961 ? ? ?Cancelled Treatment:    Reason Eval/Treat Not Completed: Other (comment).  Pt reports he had dilaudid and it made his head feel "loopy" so wishes to wait for PT.  His prosthesis is at [employer] [facility], so will not have it for rehab evaluations.   ? ? ?Ivar Drape ?12/27/2021, 3:27 PM ? ?Samul Dada, PT PhD ?Acute Rehab Dept. Number: [facility] [phone] and [facility] [phone] ? ?

## 2021-12-27 NOTE — Progress Notes (Signed)
Per Dr. Vaughan Basta floor sliding scale, do not start pre-op diabetes protocol ?

## 2021-12-27 NOTE — Progress Notes (Signed)
Patient arrived back from debridement, wound vac in place and suction at 125 mmHg. Patient c/o pain 9/10 to back and some burning in left leg. Dilaudid given (see eMAR). Wife at bedside. No other complaints at this time, bed alarm on and call bell within reach.  ?

## 2021-12-27 NOTE — Consult Note (Signed)
? ? ?ORTHOPAEDIC CONSULTATION ? ?REQUESTING PHYSICIAN: Merlene Laughter, DO ? ?Chief Complaint: Draining abscess left knee. ? ?HPI: ?Cody Pena is a 61 y.o. male who presents with worsening draining abscess cellulitis left medial knee.  Patient also states he has had a small abscess on the left forearm.  No recent trauma. ? ?Past Medical History:  ?Diagnosis Date  ? COVID   ? has had Covid 3 times, not sick with it  ? Depression   ? Diabetes mellitus without complication (HCC)   ? pt states he has now been told that he is a type 1 diabetic.  ? Hypertension   ? Peripheral vascular disease (HCC)   ? ?Past Surgical History:  ?Procedure Laterality Date  ? ABDOMINAL AORTOGRAM W/LOWER EXTREMITY N/A 08/11/2021  ? Procedure: ABDOMINAL AORTOGRAM W/LOWER EXTREMITY;  Surgeon: Leonie Douglas, MD;  Location: [facility];  Service: Cardiovascular;  Laterality: N/A;  ? AMPUTATION Left 11/15/2017  ? Procedure: LEFT FOOT 2ND RAY AMPUTATION;  Surgeon: Nadara Mustard, MD;  Location: [facility] OR;  Service: Orthopedics;  Laterality: Left;  ? AMPUTATION Right 08/25/2021  ? Procedure: RIGHT TRANSMETATARSAL AMPUTATION;  Surgeon: Nadara Mustard, MD;  Location: [facility] OR;  Service: Orthopedics;  Laterality: Right;  ? AMPUTATION Right 09/27/2021  ? Procedure: BELOW KNEE AMPUTATION;  Surgeon: Nadara Mustard, MD;  Location: [facility] OR;  Service: Orthopedics;  Laterality: Right;  ? CERVICAL FUSION    ? IR ANGIOGRAM EXTREMITY LEFT  11/14/2017  ? IR ANGIOGRAM SELECTIVE EACH ADDITIONAL VESSEL  11/14/2017  ? IR ANGIOGRAM SELECTIVE EACH ADDITIONAL VESSEL  11/14/2017  ? IR TIB-PERO ART PTA MOD SED  11/14/2017  ? IR TIB-PERO ART UNI PTA EA ADD VESSEL MOD SED  11/14/2017  ? IR US GUIDE VASC ACCESS LEFT  11/14/2017  ? PERIPHERAL VASCULAR INTERVENTION Right 08/11/2021  ? Procedure: PERIPHERAL VASCULAR INTERVENTION;  Surgeon: Leonie Douglas, MD;  Location: [facility];  Service: Cardiovascular;  Laterality: Right;  rt sfa stent  ? ?Social History   ? ?Socioeconomic History  ? Marital status: Married  ?  Spouse name: Not on file  ? Number of children: Not on file  ? Years of education: Not on file  ? Highest education level: Not on file  ?Occupational History  ? Not on file  ?Tobacco Use  ? Smoking status: Every Day  ?  Packs/day: 0.50  ?  Years: 47.00  ?  Pack years: 23.50  ?  Types: Cigarettes  ?  Last attempt to quit: 08/11/2021  ?  Years since quitting: 0.3  ? Smokeless tobacco: Never  ?Vaping Use  ? Vaping Use: Some days  ?Substance and Sexual Activity  ? Alcohol use: No  ? Drug use: No  ? Sexual activity: Not on file  ?Other Topics Concern  ? Not on file  ?Social History Narrative  ? Not on file  ? ?Social Determinants of Health  ? ?Financial Resource Strain: Not on file  ?Food Insecurity: Not on file  ?Transportation Needs: Not on file  ?Physical Activity: Not on file  ?Stress: Not on file  ?Social Connections: Not on file  ? ?History reviewed. No pertinent family history. ?- negative except otherwise stated in the family history section ?Allergies  ?Allergen Reactions  ? Ceftriaxone Itching, Nausea And Vomiting and Rash  ?  Noted minutes after starting medication  ? ?Prior to Admission medications   ?Medication Sig Start Date End Date Taking? Authorizing Provider  ?acetaminophen (TYLENOL)  325 MG tablet Take 1-2 tablets (325-650 mg total) by mouth every 4 (four) hours as needed for mild pain. 10/05/21  Yes Angiulli, Mcarthur Rossetti, PA-C  ?albuterol (VENTOLIN HFA) 108 (90 Base) MCG/ACT inhaler Inhale 2 puffs into the lungs every 6 (six) hours as needed for shortness of breath. 10/05/21  Yes Angiulli, Mcarthur Rossetti, PA-C  ?ascorbic acid (VITAMIN C) 500 MG tablet Take 1 tablet (500 mg total) by mouth 2 (two) times daily. ?Patient taking differently: Take 500 mg by mouth daily. 10/05/21  Yes Angiulli, Mcarthur Rossetti, PA-C  ?aspirin 81 MG EC tablet Take 1 tablet (81 mg total) by mouth daily. Swallow whole. 08/11/21 08/11/22 Yes Leonie Douglas, MD  ?atorvastatin (LIPITOR) 80  MG tablet Take 1 tablet (80 mg total) by mouth daily. ?Patient taking differently: Take 80 mg by mouth at bedtime. 10/05/21  Yes Angiulli, Mcarthur Rossetti, PA-C  ?citalopram (CELEXA) 40 MG tablet Take 40 mg by mouth at bedtime. 09/26/21  Yes [provider]  ?clonazePAM (KLONOPIN) 0.5 MG tablet Take 1 tablet (0.5 mg total) by mouth 2 (two) times daily. 10/05/21  Yes Angiulli, Mcarthur Rossetti, PA-C  ?clopidogrel (PLAVIX) 75 MG tablet Take 75 mg by mouth at bedtime. 12/07/21  Yes [provider]  ?gabapentin (NEURONTIN) 600 MG tablet Take 1 tablet (600 mg total) by mouth 3 (three) times daily. 10/05/21  Yes Angiulli, Mcarthur Rossetti, PA-C  ?HUMALOG KWIKPEN 100 UNIT/ML KwikPen Inject 0-12 Units into the skin 3 (three) times daily. 12/07/21  Yes [provider]  ?LANTUS 100 UNIT/ML injection Inject 24 Units into the skin at bedtime. 12/07/21  Yes [provider]  ?metFORMIN (GLUCOPHAGE) 500 MG tablet Take 1 tablet (500 mg total) by mouth 2 (two) times daily with a meal. 10/05/21 10/05/22 Yes Angiulli, Mcarthur Rossetti, PA-C  ?methocarbamol (ROBAXIN) 500 MG tablet Take 1 tablet (500 mg total) by mouth every 6 (six) hours as needed for muscle spasms. 10/05/21  Yes Angiulli, Mcarthur Rossetti, PA-C  ?Multiple Vitamin (MULTIVITAMIN WITH MINERALS) TABS tablet Take 1 tablet by mouth daily. 11/17/17  Yes Darlin Drop, DO  ?nicotine (NICODERM CQ - DOSED IN MG/24 HOURS) 21 mg/24hr patch 21 mg patch daily x2 weeks then 14 mg patch daily x3 weeks then 7 mg patch daily x3 weeks and stop 10/05/21  Yes Angiulli, Mcarthur Rossetti, PA-C  ?oxyCODONE-acetaminophen (PERCOCET) 10-325 MG tablet Take 1 tablet by mouth every 4 (four) hours as needed for pain. 12/04/21  Yes Nadara Mustard, MD  ?Vitamin D, Ergocalciferol, (DRISDOL) 1.25 MG (50000 UNIT) CAPS capsule Take 1 capsule (50,000 Units total) by mouth every 7 (seven) days. ?Patient taking differently: Take 50,000 Units by mouth every Wednesday. 07/26/21  Yes Ghimire, Werner Lean, MD  ? ?CT KNEE LEFT W  CONTRAST ? ?Result Date: 12/26/2021 ?CLINICAL DATA:  Soft tissue infection suspected, knee, xray done EXAM: CT OF THE LEFT KNEE WITH CONTRAST TECHNIQUE: Multidetector CT imaging was performed following the standard protocol during bolus administration of intravenous contrast. RADIATION DOSE REDUCTION: This exam was performed according to the departmental dose-optimization program which includes automated exposure control, adjustment of the mA and/or kV according to patient size and/or use of iterative reconstruction technique. CONTRAST:  OMNIPAQUE IOHEXOL 350 MG/ML SOLN COMPARISON:  X-ray 12/26/2021 FINDINGS: Bones/Joint/Cartilage No acute fracture. No dislocation. 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 Findings are most pronounced within the medial compartment. Trace knee joint effusion, nonspecific. No  bone erosion or periosteal elevation is evident. No lytic or sclerotic bone lesion identified. Ligaments Suboptimally assessed by CT. Muscles and Tendons Musculotendinous structures appear within normal limits by CT. No intramuscular fluid collection is evident. Soft tissues Soft tissue swelling with skin thickening and extensive subcutaneous edema overlying the medial aspect of the knee. Slightly more focal area of fluid at this location measuring approximately 3.0 x 1.5 x 3.0 cm (series 4, image 71; series 9, image 62), suggesting of a phlegmon/developing abscess. No well-defined enhancing rim at this time. No soft tissue gas. No deep fascial fluid collections. Partially visualized distal superficial femoral arterial stent, which appears occluded (series 4, image 1). There is collateral reconstitution of the popliteal artery (series 4, image 30). IMPRESSION: 1. Soft tissue swelling with skin thickening and extensive subcutaneous edema overlying the medial aspect of the left knee,  most compatible with cellulitis. Slightly more focal area of fluid at this location measuring approximately 3.0 x 1.5 x 3.0 cm, suggesting a phlegmon/developing abscess. No well-defined enhancing rim at this time. No soft

## 2021-12-27 NOTE — Anesthesia Postprocedure Evaluation (Signed)
Anesthesia Post Note ? ?Patient: Cody Pena ? ?Procedure(s) Performed: LEFT LEG DEBRIDEMENT ULCERS (Left) ?APPLICATION OF WOUND VAC ? ?  ? ?Patient location during evaluation: PACU ?Anesthesia Type: General ?Level of consciousness: awake and alert ?Pain management: pain level controlled ?Vital Signs Assessment: post-procedure vital signs reviewed and stable ?Respiratory status: spontaneous breathing, nonlabored ventilation and respiratory function stable ?Cardiovascular status: blood pressure returned to baseline and stable ?Postop Assessment: no apparent nausea or vomiting ?Anesthetic complications: no ? ? ?No notable events documented. ? ?Last Vitals:  ?Vitals:  ? 12/27/21 1253 12/27/21 1311  ?BP: 117/68 120/79  ?Pulse: 85 80  ?Resp: 10 18  ?Temp: 36.7 ?C   ?SpO2: 96% 94%  ?  ?Last Pain:  ?Vitals:  ? 12/27/21 1311  ?TempSrc:   ?PainSc: 9   ? ? ?  ?  ?  ?  ?  ?  ? ?Lynda Rainwater ? ? ? ? ?

## 2021-12-27 NOTE — Op Note (Signed)
12/27/2021 ? ?12:33 PM ? ?PATIENT:  Cody Pena   ? ?PRE-OPERATIVE DIAGNOSIS:  Abscess Left Knee, Leg, and Ankle ? ?POST-OPERATIVE DIAGNOSIS:  Same ? ?PROCEDURE:  LEFT LEG EXCISIONAL DEBRIDEMENT ULCERS, APPLICATION OF WOUND VAC ?Local tissue rearrangement for wound closure 20 x 5 cm. ?Application of Kerecis tissue graft with 7 x 20 cm sheet and 95 cm? micro powder ? ?SURGEON:  Nadara Mustard, MD ? ?PHYSICIAN ASSISTANT:None ?ANESTHESIA:   General ? ?PREOPERATIVE INDICATIONS:  [name] is a  61 y.o. male with a diagnosis of Abscess Left Knee, Leg, and Ankle who failed conservative measures and elected for surgical management.   ? ?The risks benefits and alternatives were discussed with the patient preoperatively including but not limited to the risks of infection, bleeding, nerve injury, cardiopulmonary complications, the need for revision surgery, among others, and the patient was willing to proceed. ? ?OPERATIVE IMPLANTS: 7 x 20 cm sheet and 95 cm? micro powder. ?20 cm Prevena sponge and a cleanse choice sponge. ? ?@ENCIMAGES @ ? ?OPERATIVE FINDINGS: Tissue margins were clear the abscess tissue was sent for cultures. ? ?OPERATIVE PROCEDURE: Patient was brought the operating room and underwent a general anesthetic.  After adequate levels anesthesia were obtained patient's left lower extremity was prepped using DuraPrep draped into a sterile field a timeout was called.  An elliptical incision was made around the ulcerative tissue this was extended until the tissue margins were clear.  This left a wound that was 20 x 5 cm.  This was debrided back to healthy viable fascia the infection did not extend below the fascia.  The wound was irrigated with normal saline electrocautery was used hemostasis.  The wound bed was filled with 95 cm? of micro powder this was covered with the 7 x 20 sheet.  The surgical incision was extended and local tissue rearrangement was used to close the wound 20 x 5 cm.  This was closed  with 2-0 nylon.  A sterile [name] 20 cm wound VAC was applied on top of a cleanse choice sponge the wound was gaped open about 1 cm.  This had a good suction fit patient was extubated taken the PACU in stable condition. ? ?Debridement type: Excisional Debridement ? ?Side: left ? ?Body Location: calf  ? ?Tools used for debridement: scalpel and rongeur ? ?Pre-debridement Wound size (cm):   Length: 1        Width: 1     Depth: 1  ? ?Post-debridement Wound size (cm):   Length: 20        Width: 5     Depth: 1  ? ?Debridement depth beyond dead/damaged tissue down to healthy viable tissue: yes ? ?Tissue layer involved: skin, subcutaneous tissue, muscle / fascia ? ?Nature of tissue removed: Slough, Necrotic, Devitalized Tissue, Non-viable tissue, and Purulence ? ?Irrigation volume: 1 liter    ? ?Irrigation fluid type: Normal Saline ? ? ?DISCHARGE PLANNING: ? ?Antibiotic duration: Continue antibiotics adjust according to tissue cultures. ? ?Weightbearing: Weightbearing as tolerated on the left ? ?Pain medication: Opioid pathway ? ?Dressing care/ Wound VAC: Continue wound VAC for 1 week after discharge ? ?Ambulatory devices: As per physical therapy ? ?Discharge to: Anticipate discharge to home ? ?Follow-up: In the office 1 week post operative. ? ? ? ? ? ? ?  ?

## 2021-12-28 ENCOUNTER — Inpatient Hospital Stay (HOSPITAL_COMMUNITY): Payer: 59

## 2021-12-28 ENCOUNTER — Encounter (HOSPITAL_COMMUNITY): Payer: 59

## 2021-12-28 DIAGNOSIS — E440 Moderate protein-calorie malnutrition: Secondary | ICD-10-CM | POA: Diagnosis not present

## 2021-12-28 DIAGNOSIS — T148XXA Other injury of unspecified body region, initial encounter: Secondary | ICD-10-CM | POA: Diagnosis not present

## 2021-12-28 DIAGNOSIS — L02416 Cutaneous abscess of left lower limb: Secondary | ICD-10-CM | POA: Diagnosis not present

## 2021-12-28 DIAGNOSIS — F17210 Nicotine dependence, cigarettes, uncomplicated: Secondary | ICD-10-CM | POA: Diagnosis not present

## 2021-12-28 LAB — GLUCOSE, CAPILLARY
Glucose-Capillary: 169 mg/dL — ABNORMAL HIGH (ref 70–99)
Glucose-Capillary: 139 mg/dL — ABNORMAL HIGH (ref 70–99)
Glucose-Capillary: 135 mg/dL — ABNORMAL HIGH (ref 70–99)
Glucose-Capillary: 211 mg/dL — ABNORMAL HIGH (ref 70–99)

## 2021-12-28 LAB — CBC WITH DIFFERENTIAL/PLATELET
Abs Immature Granulocytes: 0.03 10*3/uL (ref 0.00–0.07)
Basophils Absolute: 0.1 10*3/uL (ref 0.0–0.1)
Basophils Relative: 1 %
Eosinophils Absolute: 0.3 10*3/uL (ref 0.0–0.5)
Eosinophils Relative: 3 %
HCT: 40.4 % (ref 39.0–52.0)
Hemoglobin: 13.7 g/dL (ref 13.0–17.0)
Immature Granulocytes: 0 %
Lymphocytes Relative: 25 %
Lymphs Abs: 2.5 10*3/uL (ref 0.7–4.0)
MCH: 32 pg (ref 26.0–34.0)
MCHC: 33.9 g/dL (ref 30.0–36.0)
MCV: 94.4 fL (ref 80.0–100.0)
Monocytes Absolute: 1 10*3/uL (ref 0.1–1.0)
Monocytes Relative: 11 %
Neutro Abs: 5.9 10*3/uL (ref 1.7–7.7)
Neutrophils Relative %: 60 %
Platelets: 131 10*3/uL — ABNORMAL LOW (ref 150–400)
RBC: 4.28 MIL/uL (ref 4.22–5.81)
RDW: 13.6 % (ref 11.5–15.5)
WBC: 9.8 10*3/uL (ref 4.0–10.5)
nRBC: 0 % (ref 0.0–0.2)

## 2021-12-28 LAB — COMPREHENSIVE METABOLIC PANEL
ALT: 13 U/L (ref 0–44)
AST: 10 U/L — ABNORMAL LOW (ref 15–41)
Albumin: 2.7 g/dL — ABNORMAL LOW (ref 3.5–5.0)
Alkaline Phosphatase: 85 U/L (ref 38–126)
Anion gap: 8 (ref 5–15)
BUN: 11 mg/dL (ref 8–23)
CO2: 26 mmol/L (ref 22–32)
Calcium: 8.2 mg/dL — ABNORMAL LOW (ref 8.9–10.3)
Chloride: 101 mmol/L (ref 98–111)
Creatinine, Ser: 0.84 mg/dL (ref 0.61–1.24)
GFR, Estimated: >60 mL/min (ref >60)
Glucose, Bld: 186 mg/dL — ABNORMAL HIGH (ref 70–99)
Potassium: 3.9 mmol/L (ref 3.5–5.1)
Sodium: 135 mmol/L (ref 135–145)
Total Bilirubin: 0.3 mg/dL (ref 0.3–1.2)
Total Protein: 5.7 g/dL — ABNORMAL LOW (ref 6.5–8.1)

## 2021-12-28 LAB — PHOSPHORUS: Phosphorus: 3.9 mg/dL (ref 2.5–4.6)

## 2021-12-28 LAB — MAGNESIUM: Magnesium: 1.7 mg/dL (ref 1.7–2.4)

## 2021-12-28 NOTE — Progress Notes (Signed)
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    Recently he states that he developed a pustule on his left arm about a week or 2 ago.  He put heat on it and it came to a head and he squeezed it and he started developing adjacent pustule in the area but is now doing okay. Approximately 3 days ago however he developed another lesion on his left medial knee he thought he may have been a spider bite but it got bigger and more painful and is draining some more purulent fluid.  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    In the ED there was concern for spider bite with necrotic wound infection and he was given IV vancomycin and orthopedic surgery and vascular surgery were consulted.  Orthopedic surgery was consulted and they were not initially about a septic joint or bursa but did get a CT to rule out deeper abscess that would warrant surgical intervention.  Given his abnormal ABI vascular surgery was consulted and they are planning on obtaining vein mapping and ABIs in case he ultimately requires a femoral to popliteal artery bypass but currently they feel that he has adequate circulation of the level of need to heal the wound and agree with IV antibiotics and will only intervene if the wound did not improve with IV antibiotics and local wound care.  Orthopedic surgery Dr. Meridee Score was consulted and reviewed the CT scan and has showed a medial ulcer with a large area of edema which was felt to be a large abscess medial left knee and took the patient for for  surgical intervention on 12/27/21 for excisional debridement of the left knee abscess and placement of wound VAC and Kerecis tissue graft and he is POD1.  He is complaining of Pain 5/10 and awaiting Abscess Cx. It currently is growing Rare Gram Postive Cocci with Cx being Staph Aureus; Of Note 1/2 Blood Cx + for Staph Species and was Staph Saprophyticus.   Assessment and Plan:  Left leg wound/infection with large abscess on the medial left knee with peripheral vascular disease Small left forearm abscess -Appears concerning for MRSA folliculitis with extension -No systemic symptoms but there is streaking erythema present -Also with callus formation and possible nidus for infection along the left plantar foot -Negative lactate (went from 1.8 and trended down to 1.1), no current concerns for sepsis but he did have a leukocytosis which is slowly trending down and went from 13.3 is now 10.7 -CRP was elevated at 4.4 and ESR was 22 -C/w IV antibiotics (Cefepime/Vanc after discussion with pharmacy) -Orthopedics will consult, plan for excisional debridement of the left knee abscess and placement of wound VAC and possible tissue graft -I have ordered ABIs in case revascularization may be indicated -Patient was NPO after midnight 12/27/2021 and now on a diet  -LE wound order set utilized including labs (CRP, ESR, A1c, prealbumin, HIV, and blood cultures) and consults (diabetes coordinator; peripheral vascular navigator; TOC team; wound care; and nutrition)  -Blood Cx done and 1/2  growing Staph Species (Staph Saprophyticus) and Abscess Wound Cx growing Staph Aureus with Sensitivities pending. Will discuss with ID about Blood Cx and repeat  -Vascular surgery consulted as below -[place] was also consulted and made some recommendations -Continue supportive care and he is on IV normal saline at 75 MLS per hour -Pain control with Hydromorphone 0.5-1 mg IV q4hprn Severe Pain from Pain Scale of 7-10 and po  Hydromorphone 2-3 mg po q4hprn Severe Pain; he also has methocarbamol 500 mg p.o/IV. every 6.  For muscle spasm, and Oxycodone 5-10 mg po q4hprn Moderate Pain from a Pain scale of 4-6 -Bowel regimen with MiraLAX 17 g p.o. daily as needed -Patient no longer has a PCP so will get TOC to arrange  -Ortho evaluated and there was 100 mL of Clear Serosanguinous fluid in the VAC cannister  -Per Ortho can D/C on oral Abx and Portable [name] wound VAC pump once Cx are finalized  -Will get PT/OT to evaluate   PVD -s/p recent BKA -Continue ASA 81 mg p.o. daily, Clopidogrel 75 mg p.o. nightly, atorvastatin 80 mg p.o. nightly -Vascular surgery is consulting and they agree with IV antibiotics.  They feel if the wound did not continue to improve with IV antibiotics and local wound care the only Option from a vascular standpoint would be a femoral to popliteal artery bypass and they do not think this is likely however they can obtain a vein mapping ABIs and have in case he ultimately does require bypass; Unfortunately ABI and Bilateral LE vein mapping not able to be done given Left sided Wound Vac in addition to extensive wrapping of LE extremity    DM Type 2 -Recent A1c was 9.3, indicating poor control -Continue Lantus substitution with Semglee 24 units nightly we will increase to 28 units daily and with moderate NovoLog sliding scale insulin before meals and at bedtime -Hold Home Metformin -Continue Gabapentin 6 mg p.o. 3 times daily -Continue monitor CBGs carefully; CBGs ranging from 141-248  Thrombocytopenia  -Platelet Count dropped and went from 150 -> 131 -Continue to Monitor for S/Sx of Bleeding; No overt bleeding noted -Repeat CBC in the AM  Hypoalbuminemia -Patient's Albumin Level went from 3.2 -> 2.7 -Continue to Monitor and Trend -Repeat CMP in the AM   COPD with ongoing tobacco use Tobacco abuse -Continue Albuterol 2.5 mg IH every 6 as needed shortness of breath -Denies h/o COPD but on  Albuterol and has clubbing of fingers -Encourage cessation.   -This was discussed with the patient and should be reviewed on an ongoing basis.   -Patch ordered    Mood disorder Depression and anxiety -Continue Citalopram 40 mg p.o. nightly and Clonazepam 0.5 mg p.o. twice daily   Hypertension -Continue to monitor blood pressures per protocol -Last blood pressure reading was 123/67 but got as high as 165/80 previously -Continue with 5 mg IV every 4 as needed for high blood pressure   Leukocytosis, improved -In the setting of above -WBC is trending down and went from 13.3 -> 10.7 -> 9.8 -Continue to monitor and trend as patient is on antibiotics -Repeat CBC in a.m.  Non-severe (Moderate) Malnutrition in the Context of Acute Illness/Injury -Nutritionist consulted and appreciate further evaluation and recc's -They are recommending Ensure Enlive po BID, MVI+Minerals Daily, and 1 packet Juven po BID  DVT prophylaxis: SCDs Start: 12/27/21 1311 enoxaparin (LOVENOX) injection 40 mg Start: 12/26/21 1100    Code Status: Full Code Family Communication: No family currently at bedside  Disposition Plan:  Level of care: Med-Surg Status is: Inpatient Remains inpatient appropriate because: Awaiting Clearance from Orthopedic Surgery and will need PT/OT to evaluate and Treat   Consultants:  Vascular Surgery Orthopedic Surgery   Procedures:  ABI's and Vein Mapping to be done PROCEDURE by Dr. Sharol Given:  LEFT LEG EXCISIONAL DEBRIDEMENT ULCERS, APPLICATION OF WOUND VAC Local tissue rearrangement for wound closure 20 x 5 cm. Application of Kerecis tissue graft with 7 x 20 cm sheet and 95 cm micro powder  Antimicrobials:  Anti-infectives (From admission, onward)    Start     Dose/Rate Route Frequency Ordered Stop   12/27/21 0600  ceFAZolin (ANCEF) IVPB 2g/100 mL premix  Status:  Discontinued        2 g 200 mL/hr over 30 Minutes Intravenous On call to O.R. 12/26/21 1927 12/27/21 1247    12/26/21 2200  vancomycin (VANCOCIN) IVPB 1000 mg/200 mL premix        1,000 mg 200 mL/hr over 60 Minutes Intravenous Every 8 hours 12/26/21 1106     12/26/21 1100  vancomycin (VANCOCIN) IVPB 1000 mg/200 mL premix        1,000 mg 200 mL/hr over 60 Minutes Intravenous  Once 12/26/21 1058 12/26/21 1655   12/26/21 1100  ceFEPIme (MAXIPIME) 2 g in sodium chloride 0.9 % 100 mL IVPB        2 g 200 mL/hr over 30 Minutes Intravenous Every 8 hours 12/26/21 1058     12/26/21 0600  vancomycin (VANCOCIN) IVPB 1000 mg/200 mL premix        1,000 mg 200 mL/hr over 60 Minutes Intravenous  Once 12/26/21 0548 12/26/21 0751       Subjective: Seen and examined at bedside and sitting up at the edge of the bed. States he had pain in his leg and knee and also complaining of pain in his back but he states that pain in his back is chronic. States the pain is tolerable after taking Oxycodone and is a 5/10 in severity. States he no longer has a PCP and needs someone to follow him to refill his chronic pain scripts. No Nausea or Vomiting. No other concerns or complaints at this time.   Objective: Vitals:   12/27/21 1955 12/27/21 2352 12/28/21 0431 12/28/21 0751  BP: 117/69 121/78 115/62 123/67  Pulse: 88 88 81 76  Resp:  17  16  Temp: 99.2 F (37.3 C) 98.3 F (36.8 C) 98.3 F (36.8 C) 99 F (37.2 C)  TempSrc: Oral  Oral Oral  SpO2: 96% 92% 97% 97%  Weight:      Height:        Intake/Output Summary (Last 24 hours) at 12/28/2021 0936 Last data filed at 12/28/2021 0606 Gross per 24 hour  Intake 2391.05 ml  Output 3710 ml  Net -1318.95 ml   Filed Weights   12/26/21 2100  Weight: 101.2 kg   Examination: Physical Exam:  Constitutional: WN/WD overweight Caucasian Male in NAD appears calm but slightly uncomfortable Respiratory: Diminished to auscultation bilaterally with coarse breath sounds, no wheezing, rales, rhonchi or crackles. Normal respiratory effort and patient is not tachypenic. No accessory  muscle use. Unlabored breathing  Cardiovascular: RRR, no murmurs / rubs / gallops. S1 and S2 auscultated.  Abdomen: Soft, non-tender, Mildly distended 2/2 body habitus.  Bowel sounds positive.  GU: Deferred. Musculoskeletal: No clubbing / cyanosis of digits/nails. Left Leg wrapped and connected to a Wound Vac Neurologic: CN 2-12 grossly intact with no focal deficits. Psychiatric: Normal  judgment and insight. Alert and oriented x 3. Normal mood and appropriate affect.   Data Reviewed: I have personally reviewed following labs and imaging studies  CBC: Recent Labs  Lab 12/25/21 2235 12/27/21 0122 12/28/21 0244  WBC 13.3* 10.7* 9.8  NEUTROABS 9.8*  --  5.9  HGB 15.9 15.6 13.7  HCT 48.3 45.8 40.4  MCV 96.4 92.5 94.4  PLT 150 PLATELET CLUMPS NOTED ON SMEAR, UNABLE TO ESTIMATE 007*   Basic Metabolic Panel: Recent Labs  Lab 12/25/21 2235 12/27/21 0122 12/28/21 0244  NA 138 138 135  K 3.6 4.1 3.9  CL 102 101 101  CO2 28 28 26   GLUCOSE 286* 219* 186*  BUN 6* 6* 11  CREATININE 0.96 0.85 0.84  CALCIUM 8.5* 8.6* 8.2*  MG  --   --  1.7  PHOS  --   --  3.9   GFR: Estimated Creatinine Clearance: 111.9 mL/min (by C-G formula based on SCr of 0.84 mg/dL). Liver Function Tests: Recent Labs  Lab 12/25/21 2235 12/28/21 0244  AST 14* 10*  ALT 17 13  ALKPHOS 116 85  BILITOT 0.6 0.3  PROT 6.1* 5.7*  ALBUMIN 3.2* 2.7*   No results for input(s): LIPASE, AMYLASE in the last 168 hours. No results for input(s): AMMONIA in the last 168 hours. Coagulation Profile: No results for input(s): INR, PROTIME in the last 168 hours. Cardiac Enzymes: No results for input(s): CKTOTAL, CKMB, CKMBINDEX, TROPONINI in the last 168 hours. BNP (last 3 results) No results for input(s): PROBNP in the last 8760 hours. HbA1C: No results for input(s): HGBA1C in the last 72 hours. CBG: Recent Labs  Lab 12/27/21 0927 12/27/21 1223 12/27/21 1635 12/27/21 1956 12/28/21 0748  GLUCAP 192* 141* 248* 189*  169*   Lipid Profile: No results for input(s): CHOL, HDL, LDLCALC, TRIG, CHOLHDL, LDLDIRECT in the last 72 hours. Thyroid Function Tests: No results for input(s): TSH, T4TOTAL, FREET4, T3FREE, THYROIDAB in the last 72 hours. Anemia Panel: No results for input(s): VITAMINB12, FOLATE, FERRITIN, TIBC, IRON, RETICCTPCT in the last 72 hours. Sepsis Labs: Recent Labs  Lab 12/25/21 2244 12/26/21 0647  LATICACIDVEN 1.8 1.1    Recent Results (from the past 240 hour(s))  Resp Panel by RT-PCR (Flu A&B, Covid) Nasopharyngeal Swab     Status: None   Collection Time: 12/25/21 10:22 PM   Specimen: Nasopharyngeal Swab; Nasopharyngeal(NP) swabs in vial transport medium  Result Value Ref Range Status   SARS Coronavirus 2 by RT PCR NEGATIVE NEGATIVE Final    Comment: (NOTE) SARS-CoV-2 target nucleic acids are NOT DETECTED.  The SARS-CoV-2 RNA is generally detectable in upper respiratory specimens during the acute phase of infection. The lowest concentration of SARS-CoV-2 viral copies this assay can detect is 138 copies/mL. A negative result does not preclude SARS-Cov-2 infection and should not be used as the sole basis for treatment or other patient management decisions. A negative result may occur with  improper specimen collection/handling, submission of specimen other than nasopharyngeal swab, presence of viral mutation(s) within the areas targeted by this assay, and inadequate number of viral copies(<138 copies/mL). A negative result must be combined with clinical observations, patient history, and epidemiological information. The expected result is Negative.  Fact Sheet for Patients:  EntrepreneurPulse.com.au  Fact Sheet for Healthcare Providers:  IncredibleEmployment.be  This test is no t yet approved or cleared by the [place] FDA and  has been authorized for detection and/or diagnosis of SARS-CoV-2 by FDA under an Emergency Use Authorization  (EUA).  This EUA will remain  in effect (meaning this test can be used) for the duration of the COVID-19 declaration under Section 564(b)(1) of the Act, 21 U.S.C.section 360bbb-3(b)(1), unless the authorization is terminated  or revoked sooner.       Influenza A by PCR NEGATIVE NEGATIVE Final   Influenza B by PCR NEGATIVE NEGATIVE Final    Comment: (NOTE) The Xpert Xpress SARS-CoV-2/FLU/RSV plus assay is intended as an aid in the diagnosis of influenza from Nasopharyngeal swab specimens and should not be used as a sole basis for treatment. Nasal washings and aspirates are unacceptable for Xpert Xpress SARS-CoV-2/FLU/RSV testing.  Fact Sheet for Patients: EntrepreneurPulse.com.au  Fact Sheet for Healthcare Providers: IncredibleEmployment.be  This test is not yet approved or cleared by the [place] FDA and has been authorized for detection and/or diagnosis of SARS-CoV-2 by FDA under an Emergency Use Authorization (EUA). This EUA will remain in effect (meaning this test can be used) for the duration of the COVID-19 declaration under Section 564(b)(1) of the Act, 21 U.S.C. section 360bbb-3(b)(1), unless the authorization is terminated or revoked.  Performed at [facility], [place] [address], [place], [place] [address]   Surgical pcr screen     Status: None   Collection Time: 12/25/21 10:22 PM   Specimen: Nasal Mucosa; Nasal Swab  Result Value Ref Range Status   MRSA, PCR NEGATIVE NEGATIVE Final   Staphylococcus aureus NEGATIVE NEGATIVE Final    Comment: (NOTE) The Xpert SA Assay (FDA approved for NASAL specimens in patients 53 years of age and older), is one component of a comprehensive surveillance program. It is not intended to diagnose infection nor to guide or monitor treatment. Performed at [facility], [place] [address], [place], [place] [address]   Blood culture (routine x 2)     Status: None (Preliminary result)    Collection Time: 12/25/21 10:45 PM   Specimen: BLOOD  Result Value Ref Range Status   Specimen Description BLOOD BLOOD RIGHT WRIST  Final   Special Requests   Final    BOTTLES DRAWN AEROBIC AND ANAEROBIC Blood Culture adequate volume   Culture   Final    NO GROWTH 3 DAYS Performed at [facility], [place] [address], [place], [place] [address]    Report Status PENDING  Incomplete  Blood culture (routine x 2)     Status: Abnormal (Preliminary result)   Collection Time: 12/25/21 10:47 PM   Specimen: BLOOD  Result Value Ref Range Status   Specimen Description BLOOD RIGHT ANTECUBITAL  Final   Special Requests   Final    BOTTLES DRAWN AEROBIC AND ANAEROBIC Blood Culture adequate volume   Culture  Setup Time   Final    GRAM POSITIVE COCCI AEROBIC BOTTLE ONLY CRITICAL RESULT CALLED TO, READ BACK BY AND VERIFIED WITH: V BRYK,PHARMD@[id number]  12/27/21    Culture (A)  Final    STAPHYLOCOCCUS SAPROPHYTICUS SUSCEPTIBILITIES TO FOLLOW Performed at [facility], [place] [address], [place], [place] [address]    Report Status PENDING  Incomplete  Blood Culture ID Panel (Reflexed)     Status: Abnormal   Collection Time: 12/25/21 10:47 PM  Result Value Ref Range Status   Enterococcus faecalis NOT DETECTED NOT DETECTED Final   Enterococcus Faecium NOT DETECTED NOT DETECTED Final   Listeria monocytogenes NOT DETECTED NOT DETECTED Final   Staphylococcus species DETECTED (A) NOT DETECTED Final    Comment: CRITICAL RESULT CALLED TO, READ BACK BY AND VERIFIED WITH:  V BRYK,PHARMD@[id number]  12/27/21 [place]    Staphylococcus aureus (BCID) NOT DETECTED NOT DETECTED Final   Staphylococcus epidermidis NOT DETECTED NOT DETECTED Final   Staphylococcus lugdunensis NOT DETECTED NOT DETECTED Final   Streptococcus species NOT DETECTED NOT DETECTED Final   Streptococcus agalactiae NOT DETECTED NOT DETECTED Final   Streptococcus pneumoniae NOT DETECTED NOT DETECTED Final   Streptococcus pyogenes NOT DETECTED NOT  DETECTED Final   A.calcoaceticus-baumannii NOT DETECTED NOT DETECTED Final   Bacteroides fragilis NOT DETECTED NOT DETECTED Final   Enterobacterales NOT DETECTED NOT DETECTED Final   Enterobacter cloacae complex NOT DETECTED NOT DETECTED Final   Escherichia coli NOT DETECTED NOT DETECTED Final   Klebsiella aerogenes NOT DETECTED NOT DETECTED Final   Klebsiella oxytoca NOT DETECTED NOT DETECTED Final   Klebsiella pneumoniae NOT DETECTED NOT DETECTED Final   Proteus species NOT DETECTED NOT DETECTED Final   Salmonella species NOT DETECTED NOT DETECTED Final   Serratia marcescens NOT DETECTED NOT DETECTED Final   Haemophilus influenzae NOT DETECTED NOT DETECTED Final   Neisseria meningitidis NOT DETECTED NOT DETECTED Final   Pseudomonas aeruginosa NOT DETECTED NOT DETECTED Final   Stenotrophomonas maltophilia NOT DETECTED NOT DETECTED Final   Candida albicans NOT DETECTED NOT DETECTED Final   Candida auris NOT DETECTED NOT DETECTED Final   Candida glabrata NOT DETECTED NOT DETECTED Final   Candida krusei NOT DETECTED NOT DETECTED Final   Candida parapsilosis NOT DETECTED NOT DETECTED Final   Candida tropicalis NOT DETECTED NOT DETECTED Final   Cryptococcus neoformans/gattii NOT DETECTED NOT DETECTED Final    Comment: Performed at [facility], [address], [place], [place] [address]  Aerobic/Anaerobic Culture w Gram Stain (surgical/deep wound)     Status: None (Preliminary result)   Collection Time: 12/27/21 12:02 PM   Specimen: Abscess  Result Value Ref Range Status   Specimen Description ABSCESS  Final   Special Requests ABSCESS LEFT KNEE SPEC A  Final   Gram Stain   Final    RARE WBC PRESENT, PREDOMINANTLY PMN RARE GRAM POSITIVE COCCI IN PAIRS    Culture   Final    MODERATE STAPHYLOCOCCUS AUREUS SUSCEPTIBILITIES TO FOLLOW Performed at [facility], [place] [address], [place],  [address]    Report Status PENDING  Incomplete     Radiology Studies: CT KNEE  LEFT W CONTRAST  Result Date: 12/26/2021 CLINICAL DATA:  Soft tissue infection suspected, knee, xray done EXAM: CT OF THE LEFT KNEE WITH CONTRAST TECHNIQUE: Multidetector CT imaging was performed following the standard protocol during bolus administration of intravenous contrast. RADIATION DOSE REDUCTION: This exam was performed according to the departmental dose-optimization program which includes automated exposure control, adjustment of the mA and/or kV according to patient size and/or use of iterative reconstruction technique. CONTRAST:  160m OMNIPAQUE IOHEXOL 350 MG/ML SOLN COMPARISON:  X-ray 12/26/2021 FINDINGS: Bones/Joint/Cartilage No acute fracture. No dislocation. 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 Findings are most pronounced within the medial compartment. Trace knee joint effusion, nonspecific. No bone erosion or periosteal elevation is evident. No lytic or sclerotic bone lesion identified. Ligaments Suboptimally assessed by CT. Muscles and Tendons Musculotendinous structures appear within normal limits by CT. No intramuscular fluid collection is evident. Soft tissues Soft tissue swelling with skin thickening and extensive subcutaneous edema overlying the medial aspect of the knee. Slightly more focal area of fluid at this location measuring approximately 3.0 x 1.5 x  3.0 cm (series 4, image 71; series 9, image 62), suggesting of a phlegmon/developing abscess. No well-defined enhancing rim at this time. No soft tissue gas. No deep fascial fluid collections. Partially visualized distal superficial femoral arterial stent, which appears occluded (series 4, image 1). There is collateral reconstitution of the popliteal artery (series 4, image 30). IMPRESSION: 1. Soft tissue swelling with skin thickening and extensive subcutaneous edema overlying the medial aspect of the left  knee, most compatible with cellulitis. Slightly more focal area of fluid at this location measuring approximately 3.0 x 1.5 x 3.0 cm, suggesting a phlegmon/developing abscess. No well-defined enhancing rim at this time. No soft tissue gas. 2. 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 Trace left knee joint effusion is likely reactive. 3. Distal superficial femoral artery appears occluded. There is collateral reconstitution of the popliteal artery. Consider lower extremity arterial ultrasound for further evaluation. Electronically Signed   By: Davina Poke D.O.   On: 12/26/2021 12:06     Scheduled Meds:  aspirin EC  81 mg Oral Daily   atorvastatin  80 mg Oral QHS   citalopram  40 mg Oral QHS   clonazePAM  0.5 mg Oral BID   clopidogrel  75 mg Oral QHS   docusate sodium  100 mg Oral BID   enoxaparin (LOVENOX) injection  40 mg Subcutaneous Q24H   feeding supplement  237 mL Oral BID WC   gabapentin  600 mg Oral TID   insulin aspart  0-15 Units Subcutaneous TID WC   insulin aspart  0-5 Units Subcutaneous QHS   insulin glargine-yfgn  28 Units Subcutaneous QHS   leptospermum manuka honey  1 application. Topical Daily   multivitamin with minerals  1 tablet Oral Daily   nicotine  21 mg Transdermal Daily   nutrition supplement (JUVEN)  1 packet Oral BID BM   Continuous Infusions:  sodium chloride 75 mL/hr at 12/27/21 2350   sodium chloride     ceFEPime (MAXIPIME) IV 2 g (12/28/21 0510)   methocarbamol (ROBAXIN) IV     vancomycin 1,000 mg (12/28/21 0606)    LOS: 2 days   Raiford Noble, DO [facility] Available via Epic secure chat 7am-7pm After these hours, please refer to coverage provider listed on [email or web address] 12/28/2021, 9:36 AM

## 2021-12-28 NOTE — Plan of Care (Signed)

## 2021-12-28 NOTE — Plan of Care (Signed)
Case discussed at length with Dr. Sharol Given. Will plan local care as directed by Dr. Sharol Given to left knee infection.  Left foot improving. Hopefully will avoid revascularization to left lower extremity. Plan to see in office in next 4 weeks with ABI. Please call for questions.  Cody Pena. Stanford Breed, MD [facility] Phone Number: [phone] 12/28/2021 10:31 AM

## 2021-12-28 NOTE — Progress Notes (Signed)
Patient ID: Cody Pena, male   DOB: 1960/09/03, 61 y.o.   MRN: [id number] Patient is a 61 year old gentleman who presents in follow-up for debridement and application of tissue graft medial left knee.  There is 100 cc of clear serosanguineous fluid in the wound VAC canister there is 2 checks with a good suction fit.  Cultures are pending.  Patient will be able to discharge on oral antibiotics and the portable [name] wound VAC pump.  Anticipate discharge once cultures are finalized.  Orders are placed for the portable pump for discharge.

## 2021-12-28 NOTE — Progress Notes (Signed)
Physical Therapy Evaluation Patient Details Name: Cody CarrelRobert K Pena MRN: [id number] DOB: 04/17/1961 Today's Date: 12/28/2021  History of Present Illness  61 y/o male presented to hosp on 5/15 for abscess on L knee with I and D done 5/17 and wound vac applied.  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  PMHx:  Covid, depression, uncontrolled DM, HTN, PVD, cervical spine fusion, peripheral neuropathy, OA,  Clinical Impression  Pt was seen for initial evaluation with practice of bed mob and transfers, as well as a review of exercises for assisting with power up to walk.  Pt is demonstrating a good comprehension of the routine, having esp having done them previously after R BK surgery.  Will recommend he continue PT acutely and progress to HHPT for follow up with gait and to get back to prosthetist's office for fitting R lower leg.  Pt is already fairly reliably able to step pivot to a chair, and will make good use of prosthesis for further gait recovery.  Follow acute PT goals as are outlined below.       Recommendations for follow up therapy are one component of a multi-disciplinary discharge planning process, led by the attending physician.  Recommendations may be updated based on patient status, additional functional criteria and insurance authorization.  Follow Up Recommendations Home health PT    Assistance Recommended at Discharge Frequent or constant Supervision/Assistance  Patient can return home with the following  A little help with walking and/or transfers;A little help with bathing/dressing/bathroom;Assistance with cooking/housework;Assist for transportation;Help with stairs or ramp for entrance    Equipment Recommendations Wheelchair cushion (measurements PT)  Recommendations for Other Services       Functional Status Assessment Patient has had a recent decline in their functional status and demonstrates the ability to  make significant improvements in function in a reasonable and predictable amount of time.     Precautions / Restrictions Precautions Precautions: Fall Precaution Comments: wound vac on LLE calf Restrictions Weight Bearing Restrictions: No      Mobility  Bed Mobility Overal bed mobility: Needs Assistance Bed Mobility: Supine to Sit     Supine to sit: Min guard     General bed mobility comments: min guard for safety    Transfers Overall transfer level: Needs assistance Equipment used: 1 person hand held assist Transfers: Sit to/from Stand, Bed to chair/wheelchair/BSC Sit to Stand: Mod assist Stand pivot transfers: Mod assist              Ambulation/Gait               General Gait Details: unable to walk  Stairs            Wheelchair Mobility    Modified Rankin (Stroke Patients Only)       Balance Overall balance assessment: Needs assistance Sitting-balance support: Single extremity supported Sitting balance-[name]: Fair     Standing balance support: Bilateral upper extremity supported, During functional activity Standing balance-[name]: Poor                               Pertinent Vitals/Pain Pain Assessment Pain Assessment: Faces Faces Pain Scale: Hurts a little bit Breathing: normal Negative Vocalization: none Body Language: relaxed Pain Location: L calf Pain Descriptors / Indicators: Guarding Pain Intervention(s): Monitored during session, Repositioned, Premedicated before session    Home Living Family/patient expects  to be discharged to:: Private residence Living Arrangements: Alone Available Help at Discharge: Family;Available 24 hours/day Type of Home: House Home Access: Ramped entrance       Home Layout: One level Home Equipment: Agricultural consultant (2 wheels);Cane - single point;BSC/3in1 Additional Comments: pivoting on LLE to chair presurgery on L calf    Prior Function Prior Level of Function :  Independent/Modified Independent             Mobility Comments: was pivoting to wc before his L calf surgery       Hand Dominance   Dominant Hand: Right    Extremity/Trunk Assessment   Upper Extremity Assessment Upper Extremity Assessment: Overall WFL for tasks assessed    Lower Extremity Assessment Lower Extremity Assessment: Generalized weakness;RLE deficits/detail;LLE deficits/detail RLE Deficits / Details: recent R BK amputation RLE Coordination: decreased gross motor LLE Deficits / Details: new L calf wound with I and D LLE Coordination: decreased gross motor    Cervical / Trunk Assessment Cervical / Trunk Assessment: Kyphotic (mild)  Communication   Communication: No difficulties  Cognition Arousal/Alertness: Awake/alert Behavior During Therapy: WFL for tasks assessed/performed Overall Cognitive Status: Within Functional Limits for tasks assessed                                          General Comments General comments (skin integrity, edema, etc.): pt is up to pivot to chair after standing practice, and is most comfortable with transition from standing to the chair.  Has been sliding into the chair from bed to wc with removal of armrest    Exercises General Exercises - Lower Extremity Ankle Circles/Pumps: AROM, Left, 5 reps Quad Sets: AROM, 10 reps Gluteal Sets: AROM, 10 reps Hip ABduction/ADduction: AROM, 10 reps   Assessment/Plan    PT Assessment Patient needs continued PT services  PT Problem List Decreased strength;Decreased range of motion;Decreased activity tolerance;Decreased balance;Decreased mobility;Decreased coordination;Decreased knowledge of use of DME;Decreased safety awareness;Decreased knowledge of precautions;Decreased skin integrity;Pain       PT Treatment Interventions DME instruction;Functional mobility training;Therapeutic activities;Therapeutic exercise;Balance training;Neuromuscular re-education;Patient/family  education    PT Goals (Current goals can be found in the Care Plan section)  Acute Rehab PT Goals Patient Stated Goal: to get home and to the prosthetist's office PT Goal Formulation: With patient/family Time For Goal Achievement: 01/04/22 Potential to Achieve Goals: Good    Frequency Min 3X/week     Co-evaluation               AM-PAC PT "6 Clicks" Mobility  Outcome Measure Help needed turning from your back to your side while in a flat bed without using bedrails?: None Help needed moving from lying on your back to sitting on the side of a flat bed without using bedrails?: A Little Help needed moving to and from a bed to a chair (including a wheelchair)?: A Little Help needed standing up from a chair using your arms (e.g., wheelchair or bedside chair)?: A Little Help needed to walk in hospital room?: A Lot Help needed climbing 3-5 steps with a railing? : A Lot 6 Click Score: 17    End of Session Equipment Utilized During Treatment: Gait belt Activity Tolerance: Patient tolerated treatment well;Patient limited by fatigue Patient left: with call bell/phone within reach;in chair;with chair alarm set Nurse Communication: Mobility status;Other (comment) (discussion of transfers back to bed) PT Visit Diagnosis: Unsteadiness  on feet (R26.81);Muscle weakness (generalized) (M62.81);Pain Pain - Right/Left: Left Pain - part of body: Leg    Time: 1040-1103 PT Time Calculation (min) (ACUTE ONLY): 23 min   Charges:   PT Evaluation $PT Eval Moderate Complexity: 1 Mod PT Treatments $Therapeutic Activity: 8-22 mins       Ivar Drape 12/28/2021, 4:31 PM  Samul Dada, PT PhD Acute Rehab Dept. Number: [facility] [id number] and [facility] [phone]

## 2021-12-28 NOTE — Progress Notes (Signed)
BLE vein mapping was attempted, however patient has wound vac and entire leg is wrapped.  Wife and patient state bandage can't be removed at this time and patient isn't have surgery until wound has healed.   Results can be found under chart review under CV PROC. 12/28/2021 2:59 PM Cody Pena RVT, RDMS

## 2021-12-29 ENCOUNTER — Other Ambulatory Visit (HOSPITAL_COMMUNITY): Payer: Self-pay

## 2021-12-29 ENCOUNTER — Encounter (HOSPITAL_COMMUNITY): Payer: Self-pay | Admitting: Orthopedic Surgery

## 2021-12-29 LAB — CBC WITH DIFFERENTIAL/PLATELET
Abs Immature Granulocytes: 0.02 10*3/uL (ref 0.00–0.07)
Basophils Absolute: 0.1 10*3/uL (ref 0.0–0.1)
Basophils Relative: 1 %
Eosinophils Absolute: 0.4 10*3/uL (ref 0.0–0.5)
Eosinophils Relative: 5 %
HCT: 40.1 % (ref 39.0–52.0)
Hemoglobin: 13.3 g/dL (ref 13.0–17.0)
Immature Granulocytes: 0 %
Lymphocytes Relative: 32 %
Lymphs Abs: 2.8 10*3/uL (ref 0.7–4.0)
MCH: 31.4 pg (ref 26.0–34.0)
MCHC: 33.2 g/dL (ref 30.0–36.0)
MCV: 94.6 fL (ref 80.0–100.0)
Monocytes Absolute: 0.9 10*3/uL (ref 0.1–1.0)
Monocytes Relative: 10 %
Neutro Abs: 4.7 10*3/uL (ref 1.7–7.7)
Neutrophils Relative %: 52 %
Platelets: UNDETERMINED 10*3/uL (ref 150–400)
RBC: 4.24 MIL/uL (ref 4.22–5.81)
RDW: 13.4 % (ref 11.5–15.5)
WBC: 8.9 10*3/uL (ref 4.0–10.5)
nRBC: 0.2 % (ref 0.0–0.2)

## 2021-12-29 LAB — COMPREHENSIVE METABOLIC PANEL
ALT: 12 U/L (ref 0–44)
AST: 9 U/L — ABNORMAL LOW (ref 15–41)
Albumin: 2.8 g/dL — ABNORMAL LOW (ref 3.5–5.0)
Alkaline Phosphatase: 77 U/L (ref 38–126)
Anion gap: 7 (ref 5–15)
BUN: 20 mg/dL (ref 8–23)
CO2: 34 mmol/L — ABNORMAL HIGH (ref 22–32)
Calcium: 8.7 mg/dL — ABNORMAL LOW (ref 8.9–10.3)
Chloride: 96 mmol/L — ABNORMAL LOW (ref 98–111)
Creatinine, Ser: 1.08 mg/dL (ref 0.61–1.24)
GFR, Estimated: >60 mL/min (ref >60)
Glucose, Bld: 193 mg/dL — ABNORMAL HIGH (ref 70–99)
Potassium: 4.1 mmol/L (ref 3.5–5.1)
Sodium: 137 mmol/L (ref 135–145)
Total Bilirubin: 0.3 mg/dL (ref 0.3–1.2)
Total Protein: 6.1 g/dL — ABNORMAL LOW (ref 6.5–8.1)

## 2021-12-29 LAB — CULTURE, BLOOD (ROUTINE X 2): Special Requests: ADEQUATE

## 2021-12-29 LAB — SEDIMENTATION RATE: Sed Rate: 33 mm/hr — ABNORMAL HIGH (ref 0–16)

## 2021-12-29 LAB — GLUCOSE, CAPILLARY
Glucose-Capillary: 201 mg/dL — ABNORMAL HIGH (ref 70–99)
Glucose-Capillary: 199 mg/dL — ABNORMAL HIGH (ref 70–99)

## 2021-12-29 LAB — PHOSPHORUS: Phosphorus: 4.2 mg/dL (ref 2.5–4.6)

## 2021-12-29 LAB — MAGNESIUM: Magnesium: 2 mg/dL (ref 1.7–2.4)

## 2021-12-29 LAB — C-REACTIVE PROTEIN: CRP: 4.5 mg/dL — ABNORMAL HIGH (ref <1.0)

## 2021-12-29 MED ORDER — ENSURE ENLIVE PO LIQD
237.0000 mL | Freq: Two times a day (BID) | ORAL | 12 refills | Status: DC
Start: 2021-12-29 — End: 2023-01-06
  Filled 2021-12-29: qty 237, 1d supply, fill #0

## 2021-12-29 MED ORDER — DOXYCYCLINE HYCLATE 100 MG PO TABS
100.0000 mg | ORAL_TABLET | Freq: Two times a day (BID) | ORAL | 0 refills | Status: AC
Start: 2021-12-29 — End: 2022-01-12
  Filled 2021-12-29: qty 28, 14d supply, fill #0

## 2021-12-29 MED ORDER — JUVEN PO PACK
1.0000 | PACK | Freq: Two times a day (BID) | ORAL | 0 refills | Status: DC
  Filled 2021-12-29: qty 30, 15d supply, fill #0

## 2021-12-29 MED ORDER — ONDANSETRON HCL 4 MG PO TABS
4.0000 mg | ORAL_TABLET | Freq: Four times a day (QID) | ORAL | 0 refills | Status: DC | PRN
  Filled 2021-12-29: qty 20, 5d supply, fill #0

## 2021-12-29 MED ORDER — DOCUSATE SODIUM 100 MG PO CAPS
100.0000 mg | ORAL_CAPSULE | Freq: Two times a day (BID) | ORAL | 0 refills | Status: DC
  Filled 2021-12-29: qty 10, 5d supply, fill #0

## 2021-12-29 MED ORDER — DOXYCYCLINE HYCLATE 100 MG PO TABS: 100.0000 mg | ORAL_TABLET | Freq: Two times a day (BID) | ORAL | Status: DC

## 2021-12-29 MED ORDER — MEDIHONEY WOUND/BURN DRESSING EX PSTE
1.0000 "application " | PASTE | Freq: Every day | CUTANEOUS | 0 refills | Status: DC
  Filled 2021-12-29: qty 44, 44d supply, fill #0

## 2021-12-29 MED ORDER — OXYCODONE-ACETAMINOPHEN 10-325 MG PO TABS
1.0000 | ORAL_TABLET | Freq: Four times a day (QID) | ORAL | 0 refills | Status: DC | PRN
  Filled 2021-12-29: qty 12, 3d supply, fill #0

## 2021-12-29 MED ORDER — POLYETHYLENE GLYCOL 3350 17 GM/SCOOP PO POWD
17.0000 g | Freq: Every day | ORAL | 0 refills | Status: DC | PRN
  Filled 2021-12-29: qty 238, 14d supply, fill #0

## 2021-12-29 NOTE — Evaluation (Signed)
Occupational Therapy Evaluation Patient Details Name: Cody Pena MRN: [id number] DOB: 02/27/1961 Today's Date: 12/29/2021   History of Present Illness 61 y/o male presented to hosp on 5/15 for abscess on L knee with I and D done 5/17 and wound vac applied.  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  PMHx:  Covid, depression, uncontrolled DM, HTN, PVD, cervical spine fusion, peripheral neuropathy, OA,   Clinical Impression   PTA, pt lives with spouse, typically Modified Independent for ADLs, functional transfers (BSC/wc) and primarily uses wheelchair for mobility while awaiting finalized R LE prosthetic fitting. Pt presents now with expected post op soreness of L dominant LE though still able to effectively perform [facility] transfers this admission. Pt overall requires Setup for UB ADLs and Min A for LB ADLs due to deficits. Educated re: wound vac mgmt with LB ADLs, skin integrity importance with w/c cushion use. Session abbreviated due to breakfast tray arrival and pt declined ADL transfer practice this AM. Will continue to follow acutely. However, as pt fairly close to ADL baseline with spouse available to assist as needed, anticipate no OT follow-up needs at DC.      Recommendations for follow up therapy are one component of a multi-disciplinary discharge planning process, led by the attending physician.  Recommendations may be updated based on patient status, additional functional criteria and insurance authorization.   Follow Up Recommendations  No OT follow up    Assistance Recommended at Discharge Intermittent Supervision/Assistance  Patient can return home with the following A little help with bathing/dressing/bathroom;Assistance with cooking/housework;Assist for transportation    Functional Status Assessment  Patient has had a recent decline in their functional status and demonstrates the ability to make significant  improvements in function in a reasonable and predictable amount of time.  Equipment Recommendations  Wheelchair cushion (measurements OT) (reports his w/c cushion lost during this admission)    Recommendations for Other Services       Precautions / Restrictions Precautions Precautions: Fall Precaution Comments: wound vac on LLE Restrictions Weight Bearing Restrictions: Yes RLE Weight Bearing: Non weight bearing LLE Weight Bearing: Weight bearing as tolerated Other Position/Activity Restrictions: R BKA - pending final prosthetic fitting      Mobility Bed Mobility               General bed mobility comments: sitting EOB on entry    Transfers                   General transfer comment: declined to attempt, abbreviated session due to breakfast arrival      Balance Overall balance assessment: Needs assistance Sitting-balance support: Single extremity supported Sitting balance-[name]: Good                                     ADL either performed or assessed with clinical judgement   ADL Overall ADL's : Needs assistance/impaired Eating/Feeding: Independent;Sitting Eating/Feeding Details (indicate cue type and reason): EOB for breakfast Grooming: Set up;Sitting   Upper Body Bathing: Set up;Sitting   Lower Body Bathing: Minimal assistance;Sitting/lateral leans   Upper Body Dressing : Set up;Sitting   Lower Body Dressing: Minimal assistance;Sitting/lateral leans Lower Body Dressing Details (indicate cue type and reason): able to bend towards L foot and bring L LE up to self for sock mgmt though limited by soreness. Educated on  easier clothing to manage and wound vac mgmt with clothing.     Toileting- Clothing Manipulation and Hygiene: Minimal assistance;Sitting/lateral lean;Sit to/from stand         General ADL Comments: Pt limited by post op soreness of L LE though anticipate good improvement as wound healing progresses.      Vision Ability to See in Adequate Light: 0 Adequate Patient Visual Report: No change from baseline Vision Assessment?: No apparent visual deficits     Perception     Praxis      Pertinent Vitals/Pain Pain Assessment Pain Assessment: Faces Faces Pain Scale: Hurts a little bit Pain Location: L LE with knee extension Pain Descriptors / Indicators: Sore Pain Intervention(s): Monitored during session     Hand Dominance Right   Extremity/Trunk Assessment Upper Extremity Assessment Upper Extremity Assessment: Overall WFL for tasks assessed   Lower Extremity Assessment Lower Extremity Assessment: Defer to PT evaluation   Cervical / Trunk Assessment Cervical / Trunk Assessment: Normal   Communication Communication Communication: No difficulties   Cognition Arousal/Alertness: Awake/alert Behavior During Therapy: WFL for tasks assessed/performed Overall Cognitive Status: Within Functional Limits for tasks assessed                                       General Comments       Exercises     Shoulder Instructions      Home Living Family/patient expects to be discharged to:: Private residence Living Arrangements: Spouse/significant other Available Help at Discharge: Family;Available 24 hours/day Type of Home: House Home Access: Ramped entrance     Home Layout: One level     Bathroom Shower/Tub: Chief Strategy OfficerTub/shower unit   Bathroom Toilet: Standard Bathroom Accessibility: Yes How Accessible: Accessible via walker Home Equipment: Rolling Walker (2 wheels);Cane - single point;BSC/3in1;Wheelchair - manual          Prior Functioning/Environment Prior Level of Function : Needs assist       Physical Assist : ADLs (physical)   ADLs (physical): IADLs Mobility Comments: was pivoting to w/c since R BKA, able to propel self in w/c ADLs Comments: Pt reports able to complete ADLs Modified Independent - w/c does not fit in bathroom doorway so pt has been  using BSC and sponge bathing. Wife assists with IADLs and driving        OT Problem List: Impaired balance (sitting and/or standing);Pain;Decreased activity tolerance      OT Treatment/Interventions: Self-care/ADL training;Therapeutic exercise;Energy conservation;DME and/or AE instruction;Therapeutic activities;Patient/family education;Balance training    OT Goals(Current goals can be found in the care plan section) Acute Rehab OT Goals Patient Stated Goal: decreased L LE soreness, get final fitting for R prosthetic OT Goal Formulation: With patient Time For Goal Achievement: 01/12/22 Potential to Achieve Goals: Good  OT Frequency: Min 2X/week    Co-evaluation              AM-PAC OT "6 Clicks" Daily Activity     Outcome Measure Help from another person eating meals?: None Help from another person taking care of personal grooming?: A Little Help from another person toileting, which includes using toliet, bedpan, or urinal?: A Little Help from another person bathing (including washing, rinsing, drying)?: A Little Help from another person to put on and taking off regular upper body clothing?: A Little Help from another person to put on and taking off regular lower body clothing?: A Little 6 Click Score:  19   End of Session Nurse Communication: Other (comment) (pt asking when cultures will be back)  Activity Tolerance: Patient tolerated treatment well Patient left: in bed;with call bell/phone within reach  OT Visit Diagnosis: Other abnormalities of gait and mobility (R26.89)                Time: 1191-4782 OT Time Calculation (min): 13 min Charges:  OT General Charges $OT Visit: 1 Visit OT Evaluation $OT Eval Low Complexity: 1 Low  Bradd Canary, OTR/L Acute Rehab Services Office: [phone]   Lorre Munroe 12/29/2021, 8:51 AM

## 2021-12-29 NOTE — Progress Notes (Signed)
Inpatient Diabetes Program Recommendations  AACE/ADA: New Consensus Statement on Inpatient Glycemic Control (2015)  Target Ranges:  Prepandial:   less than 140 mg/dL      Peak postprandial:   less than 180 mg/dL (1-2 hours)      Critically ill patients:  140 - 180 mg/dL   Lab Results  Component Value Date   GLUCAP 199 (H) 12/29/2021   HGBA1C 9.2 (H) 09/27/2021    Spoke with pt at bedside regarding A1c of 9.2% and glucose control at home. Pt reports taking medications and monitoring glucose trends as prescribed. Pt mentioned his A1c was over 12% ad now is better. Discussed infection and what effects it has on glucose trends. Discussed importance of glucose control at this time. Reviewed glucose and A1c goals.  Thanks,  Christena Deem RN, MSN, BC-ADM Inpatient Diabetes Coordinator Team Pager [phone] (8a-5p)

## 2021-12-29 NOTE — Discharge Summary (Signed)
Physician Discharge Summary   Patient: Cody Pena MRN: [id number] DOB: 1961/04/25  Admit date:     12/25/2021  Discharge date: 12/29/21  Discharge Physician: Raiford Noble, DO   PCP: Pcp, No   Recommendations at discharge:   Follow up and establish with PCP within 1-2 weeks and repeat CBC, CMP, Mag, Phos Follow up with ID within 1-2 weeks Follow up with Orthopedic Surgery Dr. Sharol Given within 1-2 weeks and continue Wound Vac   Discharge Diagnoses: Principal Problem:   Wound infection Active Problems:   Mood disorder ([place])   Type 2 DM with diabetic peripheral angiopathy w/o gangrene (HCC)   Cigarette smoker   Peripheral vascular disease (HCC)   S/P BKA (below knee amputation), right (HCC)   Abscess of left lower leg   Malnutrition of moderate degree  Resolved Problems:   * No resolved hospital problems. [facility] Course: The patient is a 61 year old overweight Caucasian male with a past medical history significant for but not limited to COPD, diabetes mellitus type 2, hypertension, and history of peripheral vascular disease status post right BKA done in February 2023 who presented with a wound infection of his left knee.    Recently he states that he developed a pustule on his left arm about a week or 2 ago.  He put heat on it and it came to a head and he squeezed it and he started developing adjacent pustule in the area but is now doing okay. Approximately 3 days ago however he developed another lesion on his left medial knee he thought he may have been a spider bite but it got bigger and more painful and is draining some more purulent fluid.  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    In the ED there was concern for spider bite with necrotic wound infection and he was given IV vancomycin and orthopedic surgery and vascular surgery were consulted.  Orthopedic surgery was consulted and they were not  initially about a septic joint or bursa but did get a CT to rule out deeper abscess that would warrant surgical intervention.  Given his abnormal ABI vascular surgery was consulted and they are planning on obtaining vein mapping and ABIs in case he ultimately requires a femoral to popliteal artery bypass but currently they feel that he has adequate circulation of the level of need to heal the wound and agree with IV antibiotics and will only intervene if the wound did not improve with IV antibiotics and local wound care.  Orthopedic surgery Dr. Meridee Score was consulted and reviewed the CT scan and has showed a medial ulcer with a large area of edema which was felt to be a large abscess medial left knee and took the patient for for surgical intervention on 12/27/21 for excisional debridement of the left knee abscess and placement of wound VAC and Kerecis tissue graft and he is POD1.  He is complaining of Pain 5/10 and awaiting Abscess Cx. It currently is growing Rare Gram Postive Cocci with Cx being Staph Aureus; Of Note 1/2 Blood Cx + for Staph Species and was Staph Saprophyticus.  Assessment and Plan:  Left leg wound/infection with large abscess on the medial left knee with peripheral vascular disease Small left forearm abscess -Appears concerning for MRSA folliculitis with extension -No systemic symptoms but there is streaking erythema present -Also with callus formation and possible nidus for infection along  the left plantar foot -Negative lactate (went from 1.8 and trended down to 1.1), no current concerns for sepsis but he did have a leukocytosis which is slowly trending down and went from 13.3 is now 10.7 -CRP was elevated at 4.4 and ESR was 22 -C/w IV antibiotics (Cefepime/Vanc after discussion with pharmacy) -Orthopedics will consult, plan for excisional debridement of the left knee abscess and placement of wound VAC and possible tissue graft -I have ordered ABIs in case revascularization  may be indicated -Patient was NPO after midnight 12/27/2021 and now on a diet  -LE wound order set utilized including labs (CRP, ESR, A1c, prealbumin, HIV, and blood cultures) and consults (diabetes coordinator; peripheral vascular navigator; TOC team; wound care; and nutrition)  -Blood Cx done and 1/2 growing Staph Species (Staph Saprophyticus) and Abscess Wound Cx growing methicillin-resistant Staph Aureus with Sensitivities sensitive to vancomycin and tetracyclines. Will discuss with ID about Blood Cx and repeat  -Vascular surgery consulted as below -Davis was also consulted and made some recommendations -Continue supportive care and he is on IV normal saline at 75 MLS per hour -Pain control with Hydromorphone 0.5-1 mg IV q4hprn Severe Pain from Pain Scale of 7-10 and po Hydromorphone 2-3 mg po q4hprn Severe Pain; he also has methocarbamol 500 mg p.o/IV. every 6.  For muscle spasm, and Oxycodone 5-10 mg po q4hprn Moderate Pain from a Pain scale of 4-6 -Bowel regimen with MiraLAX 17 g p.o. daily as needed -Patient no longer has a PCP so will get TOC to arrange  -Ortho evaluated and there was 100 mL of Clear Serosanguinous fluid in the VAC cannister  -Per Ortho can D/C on oral Abx and Portable [name] wound VAC pump once Cx are finalized: Cultures were obtained and ID was consulted and recommending change to p.o. Doxy and following up in the ID clinic in 2 weeks -Will get PT/OT to evaluate recommending home health   PVD -s/p recent BKA -Continue ASA 81 mg p.o. daily, Clopidogrel 75 mg p.o. nightly, atorvastatin 80 mg p.o. nightly -Vascular surgery is consulting and they agree with IV antibiotics.  They feel if the wound did not continue to improve with IV antibiotics and local wound care the only Option from a vascular standpoint would be a femoral to popliteal artery bypass and they do not think this is likely however they can obtain a vein mapping ABIs and have in case he ultimately does  require bypass; Unfortunately ABI and Bilateral LE vein mapping not able to be done given Left sided Wound Vac in addition to extensive wrapping of LE extremity and then this can be done in outpatient setting   DM Type 2 -Recent A1c was 9.3, indicating poor control -Continue Lantus substitution with Semglee 24 units nightly we will increase to 28 units daily and with moderate NovoLog sliding scale insulin before meals and at bedtime -Hold Home Metformin -Continue Gabapentin 6 mg p.o. 3 times daily -Continue monitor CBGs carefully; CBGs ranging from 1 35-2 oh   Thrombocytopenia  -Platelet Count dropped and went from 150 -> 131 and platelets clumped this morning -Continue to Monitor for S/Sx of Bleeding; No overt bleeding noted -Repeat CBC in the AM   Hypoalbuminemia -Patient's Albumin Level went from 3.2 -> 2.7 -Continue to Monitor and Trend -Repeat CMP in the AM   COPD with ongoing tobacco use Tobacco abuse -Continue Albuterol 2.5 mg IH every 6 as needed shortness of breath -Denies h/o COPD but on Albuterol and has clubbing of  fingers -Encourage cessation.   -This was discussed with the patient and should be reviewed on an ongoing basis.   -Patch ordered and can continue at discharge   Mood disorder Depression and anxiety -Continue Citalopram 40 mg p.o. nightly and Clonazepam 0.5 mg p.o. twice daily   Hypertension -Continue to monitor blood pressures per protocol -Last blood pressure reading was 146/78 -Continue with 5 mg IV every 4 as needed for high blood pressure   Leukocytosis, improved -In the setting of above -WBC is trending down and went from 13.3 -> 10.7 -> 9.8 and is now 8.9 -Continue to monitor and trend as patient is on antibiotics -Repeat CBC in a.m.   Non-severe (Moderate) Malnutrition in the Context of Acute Illness/Injury -Nutritionist consulted and appreciate further evaluation and recc's -They are recommending Ensure Enlive po BID, MVI+Minerals Daily, and  1 packet Juven po BID  Nutrition Documentation    Flowsheet Row ED to Hosp-Admission (Discharged) from 12/25/2021 in [facility]  Nutrition Problem Moderate Malnutrition  Etiology acute illness  [PVD with L knee abscess]  Nutrition Goal Patient will meet greater than or equal to 90% of their needs  Interventions Ensure Enlive (each supplement provides 350kcal and 20 grams of protein), MVI      Pain control - [place] Controlled Substance Reporting System database was reviewed. and patient was instructed, not to drive, operate heavy machinery, perform activities at heights, swimming or participation in water activities or provide baby-sitting services while on Pain, Sleep and Anxiety Medications; until their outpatient Physician has advised to do so again. Also recommended to not to take more than prescribed Pain, Sleep and Anxiety Medications.   Consultants: Orthopedic surgery; infectious diseases Procedures performed: See above Disposition: Home health Diet recommendation:  Discharge Diet Orders (From admission, onward)     Start     Ordered   12/29/21 0000  Diet - low sodium heart healthy        12/29/21 1143           Cardiac and Carb modified diet DISCHARGE MEDICATION: Allergies as of 12/29/2021       Reactions   Ceftriaxone Itching, Nausea And Vomiting, Rash   Noted minutes after starting medication        Medication List     STOP taking these medications    ascorbic acid 500 MG tablet Commonly known as: VITAMIN C       TAKE these medications    acetaminophen 325 MG tablet Commonly known as: TYLENOL Take 1-2 tablets (325-650 mg total) by mouth every 4 (four) hours as needed for mild pain.   albuterol 108 (90 Base) MCG/ACT inhaler Commonly known as: VENTOLIN HFA Inhale 2 puffs into the lungs every 6 (six) hours as needed for shortness of breath.   aspirin EC 81 MG tablet Take 1 tablet (81 mg total) by mouth  daily. Swallow whole.   atorvastatin 80 MG tablet Commonly known as: LIPITOR Take 1 tablet (80 mg total) by mouth daily. What changed: when to take this   citalopram 40 MG tablet Commonly known as: CELEXA Take 40 mg by mouth at bedtime.   clonazePAM 0.5 MG tablet Commonly known as: KLONOPIN Take 1 tablet (0.5 mg total) by mouth 2 (two) times daily.   clopidogrel 75 MG tablet Commonly known as: PLAVIX Take 75 mg by mouth at bedtime.   docusate sodium 100 MG capsule Commonly known as: COLACE Take 1 capsule (100 mg total) by mouth  2 (two) times daily.   doxycycline 100 MG tablet Commonly known as: VIBRA-TABS Take 1 tablet (100 mg total) by mouth 2 (two) times daily for 14 days.   feeding supplement Liqd Take 237 mLs by mouth 2 (two) times daily with a meal.   nutrition supplement (JUVEN) Pack Take 1 packet by mouth 2 (two) times daily between meals.   gabapentin 600 MG tablet Commonly known as: NEURONTIN Take 1 tablet (600 mg total) by mouth 3 (three) times daily.   HumaLOG KwikPen 100 UNIT/ML KwikPen Generic drug: insulin lispro Inject 0-12 Units into the skin 3 (three) times daily.   Lantus 100 UNIT/ML injection Generic drug: insulin glargine Inject 24 Units into the skin at bedtime.   leptospermum manuka honey Pste paste Apply 1 application. topically daily.   metFORMIN 500 MG tablet Commonly known as: Glucophage Take 1 tablet (500 mg total) by mouth 2 (two) times daily with a meal.   methocarbamol 500 MG tablet Commonly known as: ROBAXIN Take 1 tablet (500 mg total) by mouth every 6 (six) hours as needed for muscle spasms.   multivitamin with minerals Tabs tablet Take 1 tablet by mouth daily.   nicotine 21 mg/24hr patch Commonly known as: NICODERM CQ - dosed in mg/24 hours 21 mg patch daily x2 weeks then 14 mg patch daily x3 weeks then 7 mg patch daily x3 weeks and stop   ondansetron 4 MG tablet Commonly known as: ZOFRAN Take 1 tablet (4 mg total) by  mouth every 6 (six) hours as needed for nausea.   oxyCODONE-acetaminophen 10-325 MG tablet Commonly known as: PERCOCET Take 1 tablet by mouth every 6 (six) hours as needed for pain. What changed: when to take this   polyethylene glycol powder 17 GM/SCOOP powder Commonly known as: GLYCOLAX/MIRALAX Take 1 capful (17 g) by mouth daily as needed for mild constipation.   Vitamin D (Ergocalciferol) 1.25 MG (50000 UNIT) Caps capsule Commonly known as: DRISDOL Take 1 capsule (50,000 Units total) by mouth every 7 (seven) days. What changed: when to take this               Discharge Care Instructions  (From admission, onward)           Start     Ordered   12/29/21 0000  Discharge wound care:       Comments: Per Orthopedic Surgery   12/29/21 1143            Follow-up Information     Newt Minion, MD Follow up in 1 week(s).   Specialty: Orthopedic Surgery Contact information: [place] [address] [phone]         Newt Minion, MD Follow up in 1 week(s).   Specialty: Orthopedic Surgery Contact information: [place] [address] [phone]         [facility] Follow up on 01/09/2022.   Specialty: Family Medicine Why: Appointment  scheduled for 01/09/2022 at 2:30 pm with Dorothyann Peng NP to establish primary care Contact information: [place] [phone]               Discharge Exam: Danley Danker Weights   12/26/21 2100  Weight: 101.2 kg   Vitals:   12/28/21 2353 12/29/21 0431  BP: 117/69 (!) 146/78  Pulse: 75 83  Resp: 18 19  Temp: 98.4 F (36.9 C) 98.6 F (37 C)  SpO2: 96% 95%   Examination: Physical Exam:  Constitutional: WN/WD overweight Caucasian male currently no acute distress appears calm and more comfortable Respiratory: Diminished to auscultation bilaterally, no wheezing, rales, rhonchi or crackles. Normal respiratory  effort and patient is not tachypenic. No accessory muscle use.  Unlabored breathing Cardiovascular: RRR, no murmurs / rubs / gallops. S1 and S2 auscultated.  Abdomen: Soft, non-tender, distended second body habitus. Bowel sounds positive.  GU: Deferred. Musculoskeletal: Has a right BKA and left leg is wrapped and connected to a wound VAC Skin: No rashes, lesions, ulcers. No induration; Warm and dry.  Neurologic: CN 2-12 grossly intact with no focal deficits. Romberg sign and cerebellar reflexes not assessed.  Psychiatric: Normal judgment and insight. Alert and oriented x 3. Normal mood and appropriate affect.   Condition at discharge: stable  The results of significant diagnostics from this hospitalization (including imaging, microbiology, ancillary and laboratory) are listed below for reference.   Imaging Studies: CT KNEE LEFT W CONTRAST  Result Date: 12/26/2021 CLINICAL DATA:  Soft tissue infection suspected, knee, xray done EXAM: CT OF THE LEFT KNEE WITH CONTRAST TECHNIQUE: Multidetector CT imaging was performed following the standard protocol during bolus administration of intravenous contrast. RADIATION DOSE REDUCTION: This exam was performed according to the departmental dose-optimization program which includes automated exposure control, adjustment of the mA and/or kV according to patient size and/or use of iterative reconstruction technique. CONTRAST:  1103m OMNIPAQUE IOHEXOL 350 MG/ML SOLN COMPARISON:  X-ray 12/26/2021 FINDINGS: Bones/Joint/Cartilage No acute fracture. No dislocation. 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 Findings are most pronounced within the medial compartment. Trace knee joint effusion, nonspecific. No bone erosion or periosteal elevation is evident. No lytic or sclerotic bone lesion identified. Ligaments Suboptimally assessed by CT. Muscles  and Tendons Musculotendinous structures appear within normal limits by CT. No intramuscular fluid collection is evident. Soft tissues Soft tissue swelling with skin thickening and extensive subcutaneous edema overlying the medial aspect of the knee. Slightly more focal area of fluid at this location measuring approximately 3.0 x 1.5 x 3.0 cm (series 4, image 71; series 9, image 62), suggesting of a phlegmon/developing abscess. No well-defined enhancing rim at this time. No soft tissue gas. No deep fascial fluid collections. Partially visualized distal superficial femoral arterial stent, which appears occluded (series 4, image 1). There is collateral reconstitution of the popliteal artery (series 4, image 30). IMPRESSION: 1. Soft tissue swelling with skin thickening and extensive subcutaneous edema overlying the medial aspect of the left knee, most compatible with cellulitis. Slightly more focal area of fluid at this location measuring approximately 3.0 x 1.5 x 3.0 cm, suggesting a phlegmon/developing abscess. No well-defined enhancing rim at this time. No soft tissue gas. 2. 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 Trace left knee joint effusion is likely reactive. 3. Distal superficial femoral artery appears occluded. There is collateral reconstitution of the popliteal artery. Consider lower extremity arterial ultrasound for further evaluation. Electronically Signed   By: NDavina PokeD.O.   On: 12/26/2021 12:06   DG Knee Complete 4 Views Left  Result Date: 12/26/2021 CLINICAL DATA:  62year old male with infection. History of critical limb ischemia left foot status post revascularization in 2019. EXAM: LEFT KNEE - COMPLETE 4+ VIEW COMPARISON:  No prior knee series. FINDINGS: Four portable views of the left knee. Mid/distal femoral artery vascular stent is partially visible. Mild tricompartmental degenerative joint space loss and spurring at  the  left knee. No acute osseous abnormality identified. No definite joint effusion. No soft tissue gas identified. IMPRESSION: 1. 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 No joint effusion or acute osseous abnormality identified. 2. Left femoral artery vascular stent. Electronically Signed   By: Genevie Ann M.D.   On: 12/26/2021 06:14    Microbiology: Results for orders placed or performed during the hospital encounter of 12/25/21  Resp Panel by RT-PCR (Flu A&B, Covid) Nasopharyngeal Swab     Status: None   Collection Time: 12/25/21 10:22 PM   Specimen: Nasopharyngeal Swab; Nasopharyngeal(NP) swabs in vial transport medium  Result Value Ref Range Status   SARS Coronavirus 2 by RT PCR NEGATIVE NEGATIVE Final    Comment: (NOTE) SARS-CoV-2 target nucleic acids are NOT DETECTED.  The SARS-CoV-2 RNA is generally detectable in upper respiratory specimens during the acute phase of infection. The lowest concentration of SARS-CoV-2 viral copies this assay can detect is 138 copies/mL. A negative result does not preclude SARS-Cov-2 infection and should not be used as the sole basis for treatment or other patient management decisions. A negative result may occur with  improper specimen collection/handling, submission of specimen other than nasopharyngeal swab, presence of viral mutation(s) within the areas targeted by this assay, and inadequate number of viral copies(<138 copies/mL). A negative result must be combined with clinical observations, patient history, and epidemiological information. The expected result is Negative.  Fact Sheet for Patients:  EntrepreneurPulse.com.au  Fact Sheet for Healthcare Providers:  IncredibleEmployment.be  This test is no t yet approved or cleared by the [place] FDA and  has been authorized for detection and/or diagnosis of SARS-CoV-2 by FDA under an Emergency Use Authorization (EUA). This  EUA will remain  in effect (meaning this test can be used) for the duration of the COVID-19 declaration under Section 564(b)(1) of the Act, 21 U.S.C.section 360bbb-3(b)(1), unless the authorization is terminated  or revoked sooner.       Influenza A by PCR NEGATIVE NEGATIVE Final   Influenza B by PCR NEGATIVE NEGATIVE Final    Comment: (NOTE) The Xpert Xpress SARS-CoV-2/FLU/RSV plus assay is intended as an aid in the diagnosis of influenza from Nasopharyngeal swab specimens and should not be used as a sole basis for treatment. Nasal washings and aspirates are unacceptable for Xpert Xpress SARS-CoV-2/FLU/RSV testing.  Fact Sheet for Patients: EntrepreneurPulse.com.au  Fact Sheet for Healthcare Providers: IncredibleEmployment.be  This test is not yet approved or cleared by the [place] FDA and has been authorized for detection and/or diagnosis of SARS-CoV-2 by FDA under an Emergency Use Authorization (EUA). This EUA will remain in effect (meaning this test can be used) for the duration of the COVID-19 declaration under Section 564(b)(1) of the Act, 21 U.S.C. section 360bbb-3(b)(1), unless the authorization is terminated or revoked.  Performed at [facility], [place] [address], [place], [place] [address]   Surgical pcr screen     Status: None   Collection Time: 12/25/21 10:22 PM   Specimen: Nasal Mucosa; Nasal Swab  Result Value Ref Range Status   MRSA, PCR NEGATIVE NEGATIVE Final   Staphylococcus aureus NEGATIVE NEGATIVE Final    Comment: (NOTE) The Xpert SA Assay (FDA approved for NASAL specimens in patients 43 years of age and older), is one component of a comprehensive surveillance program. It is not intended to diagnose infection nor to guide or monitor treatment. Performed at [facility], [place] [address], [place], [place] [address]   Blood  culture (routine x 2)     Status: None (Preliminary result)   Collection  Time: 12/25/21 10:45 PM   Specimen: BLOOD  Result Value Ref Range Status   Specimen Description BLOOD BLOOD RIGHT WRIST  Final   Special Requests   Final    BOTTLES DRAWN AEROBIC AND ANAEROBIC Blood Culture adequate volume   Culture   Final    NO GROWTH 4 DAYS Performed at [facility], [address] [place], [place] [address]    Report Status PENDING  Incomplete  Blood culture (routine x 2)     Status: Abnormal   Collection Time: 12/25/21 10:47 PM   Specimen: BLOOD  Result Value Ref Range Status   Specimen Description BLOOD RIGHT ANTECUBITAL  Final   Special Requests   Final    BOTTLES DRAWN AEROBIC AND ANAEROBIC Blood Culture adequate volume   Culture  Setup Time   Final    GRAM POSITIVE COCCI AEROBIC BOTTLE ONLY CRITICAL RESULT CALLED TO, READ BACK BY AND VERIFIED WITH: V BRYK,PHARMD@[id number]  12/27/21 Performed at [facility], [place] [address] [place], [place] [address]    Culture STAPHYLOCOCCUS SAPROPHYTICUS (A)  Final   Report Status 12/29/2021 FINAL  Final   Organism ID, Bacteria STAPHYLOCOCCUS SAPROPHYTICUS  Final      Susceptibility   Staphylococcus saprophyticus - MIC*    CIPROFLOXACIN <=0.5 SENSITIVE Sensitive     ERYTHROMYCIN >=8 RESISTANT Resistant     GENTAMICIN <=0.5 SENSITIVE Sensitive     OXACILLIN >=4 RESISTANT Resistant     TETRACYCLINE <=1 SENSITIVE Sensitive     VANCOMYCIN <=0.5 SENSITIVE Sensitive     TRIMETH/SULFA <=10 SENSITIVE Sensitive     CLINDAMYCIN RESISTANT Resistant     RIFAMPIN <=0.5 SENSITIVE Sensitive     Inducible Clindamycin POSITIVE Resistant     * STAPHYLOCOCCUS SAPROPHYTICUS  Blood Culture ID Panel (Reflexed)     Status: Abnormal   Collection Time: 12/25/21 10:47 PM  Result Value Ref Range Status   Enterococcus faecalis NOT DETECTED NOT DETECTED Final   Enterococcus Faecium NOT DETECTED NOT DETECTED Final   Listeria monocytogenes NOT DETECTED NOT DETECTED Final   Staphylococcus species DETECTED (A) NOT DETECTED Final     Comment: CRITICAL RESULT CALLED TO, READ BACK BY AND VERIFIED WITH: V BRYK,PHARMD@[id number]  12/27/21 [place]    Staphylococcus aureus (BCID) NOT DETECTED NOT DETECTED Final   Staphylococcus epidermidis NOT DETECTED NOT DETECTED Final   Staphylococcus lugdunensis NOT DETECTED NOT DETECTED Final   Streptococcus species NOT DETECTED NOT DETECTED Final   Streptococcus agalactiae NOT DETECTED NOT DETECTED Final   Streptococcus pneumoniae NOT DETECTED NOT DETECTED Final   Streptococcus pyogenes NOT DETECTED NOT DETECTED Final   A.calcoaceticus-baumannii NOT DETECTED NOT DETECTED Final   Bacteroides fragilis NOT DETECTED NOT DETECTED Final   Enterobacterales NOT DETECTED NOT DETECTED Final   Enterobacter cloacae complex NOT DETECTED NOT DETECTED Final   Escherichia coli NOT DETECTED NOT DETECTED Final   Klebsiella aerogenes NOT DETECTED NOT DETECTED Final   Klebsiella oxytoca NOT DETECTED NOT DETECTED Final   Klebsiella pneumoniae NOT DETECTED NOT DETECTED Final   Proteus species NOT DETECTED NOT DETECTED Final   Salmonella species NOT DETECTED NOT DETECTED Final   Serratia marcescens NOT DETECTED NOT DETECTED Final   Haemophilus influenzae NOT DETECTED NOT DETECTED Final   Neisseria meningitidis NOT DETECTED NOT DETECTED Final   Pseudomonas aeruginosa NOT DETECTED NOT DETECTED Final   Stenotrophomonas maltophilia NOT DETECTED NOT DETECTED Final   Candida albicans  NOT DETECTED NOT DETECTED Final   Candida auris NOT DETECTED NOT DETECTED Final   Candida glabrata NOT DETECTED NOT DETECTED Final   Candida krusei NOT DETECTED NOT DETECTED Final   Candida parapsilosis NOT DETECTED NOT DETECTED Final   Candida tropicalis NOT DETECTED NOT DETECTED Final   Cryptococcus neoformans/gattii NOT DETECTED NOT DETECTED Final    Comment: Performed at [facility], [place] [address], [place], [place] [address]  Aerobic/Anaerobic Culture w Gram Stain (surgical/deep wound)     Status: None (Preliminary result)    Collection Time: 12/27/21 12:02 PM   Specimen: Abscess  Result Value Ref Range Status   Specimen Description ABSCESS  Final   Special Requests ABSCESS LEFT KNEE SPEC A  Final   Gram Stain   Final    RARE WBC PRESENT, PREDOMINANTLY PMN RARE GRAM POSITIVE COCCI IN PAIRS Performed at [facility], [place] [address], [place], [place] [address]    Culture   Final    MODERATE METHICILLIN RESISTANT STAPHYLOCOCCUS AUREUS NO ANAEROBES ISOLATED; CULTURE IN PROGRESS FOR 5 DAYS    Report Status PENDING  Incomplete   Organism ID, Bacteria METHICILLIN RESISTANT STAPHYLOCOCCUS AUREUS  Final      Susceptibility   Methicillin resistant staphylococcus aureus - MIC*    CIPROFLOXACIN >=8 RESISTANT Resistant     ERYTHROMYCIN >=8 RESISTANT Resistant     GENTAMICIN <=0.5 SENSITIVE Sensitive     OXACILLIN >=4 RESISTANT Resistant     TETRACYCLINE <=1 SENSITIVE Sensitive     VANCOMYCIN 1 SENSITIVE Sensitive     TRIMETH/SULFA 160 RESISTANT Resistant     CLINDAMYCIN <=0.25 SENSITIVE Sensitive     RIFAMPIN <=0.5 SENSITIVE Sensitive     Inducible Clindamycin NEGATIVE Sensitive     * MODERATE METHICILLIN RESISTANT STAPHYLOCOCCUS AUREUS   Labs: CBC: Recent Labs  Lab 12/25/21 2235 12/27/21 0122 12/28/21 0244 12/29/21 0126  WBC 13.3* 10.7* 9.8 8.9  NEUTROABS 9.8*  --  5.9 4.7  HGB 15.9 15.6 13.7 13.3  HCT 48.3 45.8 40.4 40.1  MCV 96.4 92.5 94.4 94.6  PLT 150 PLATELET CLUMPS NOTED ON SMEAR, UNABLE TO ESTIMATE 131* PLATELET CLUMPS NOTED ON SMEAR, UNABLE TO ESTIMATE   Basic Metabolic Panel: Recent Labs  Lab 12/25/21 2235 12/27/21 0122 12/28/21 0244 12/29/21 0126  NA 138 138 135 137  K 3.6 4.1 3.9 4.1  CL 102 101 101 96*  CO2 28 28 26  34*  GLUCOSE 286* 219* 186* 193*  BUN 6* 6* 11 20  CREATININE 0.96 0.85 0.84 1.08  CALCIUM 8.5* 8.6* 8.2* 8.7*  MG  --   --  1.7 2.0  PHOS  --   --  3.9 4.2   Liver Function Tests: Recent Labs  Lab 12/25/21 2235 12/28/21 0244 12/29/21 0126  AST 14* 10*  9*  ALT 17 13 12   ALKPHOS 116 85 77  BILITOT 0.6 0.3 0.3  PROT 6.1* 5.7* 6.1*  ALBUMIN 3.2* 2.7* 2.8*   CBG: Recent Labs  Lab 12/28/21 1129 12/28/21 1621 12/28/21 2032 12/29/21 0735 12/29/21 1133  GLUCAP 139* 135* 211* 201* 199*   Discharge time spent: greater than 30 minutes.  Signed: Raiford Noble, DO [facility] 12/29/2021

## 2021-12-29 NOTE — Progress Notes (Signed)
Patient alert and oriented, both wife and patient verbalized understanding of dc instructions.Marland Kitchen wound vac  care teaching done. All belongings and dc papers given to patient. Toc meds to be delivered in [employer].

## 2021-12-29 NOTE — Consult Note (Addendum)
I have seen and examined the patient. I have personally reviewed the clinical findings, laboratory findings, microbiological data and imaging studies. The assessment and treatment plan was discussed with the Resident Doran Stabler. I agree with her/his recommendations except following additions/corrections.  Left medial knee abscess s/p excisional debridement and wound vac and tissue graft 5/17. OR cx growing MRSA in D2 Left forearm abscess seems to have resolved after drainage  Blood cx 5/15 1/4 bottles staphylococcus saprophyticus, likely a contaminant  DM, poorly controlled, out of care as his PCP recently retired and looking to establish care. H/o Rt BKA, Left foot 2nd ray amputation + PAD  Smoking - counseled for cessation   Labs/Imaging/Notes and microbiological data reviewed   OK to switch to doxycyline for 2 weeks when ready to go home from surgical standpoint Will follow in the clinic in 2 weeks He would like to find a new PCP in [place] for his DM. Discussed with Primary and they will assist.   Dr Renold Don covering this weekend and will follow cultures.   Odette Fraction, MD Infectious Disease Physician [facility] [address] [place], [place] [address] Phone: [phone]  Fax: [phone]   [facility]    Date of Admission:  12/25/2021     Total days of antibiotics 4         Cefepime 5/16 - 5/18 Vancomycin 5/16 - present       Reason for Consult: Left medial knee abscess   Referring Provider: Dr. Marland Mcalpine Primary Care Provider: NA  Assessment and Plan Patient is admitted for left medial knee abscess, s/p I&D and tissue graft on 5/15 with Dr. Lajoyce Corners.  Wound VAC in place.  Wound culture grew MRSA.  Blood culture 5/15 grew staph saprophyticus on 1/2 sets likely contaminant.  Patient appears clinically well on exam.  Has been afebrile.  White count trending down.  Patient can be  transition to doxycycline if that is okay with orthopedic team.  Avoid linezolid while he is taking SSRI.  Plan for longer course of 2-week with the tissue graft.  We will set up follow-up in the ID clinic.  Encourage patient on smoking cessation.  He will need a PCP for diabetic management after discharge.  Principal Problem:   Wound infection Active Problems:   Mood disorder (HCC)   Type 2 DM with diabetic peripheral angiopathy w/o gangrene (HCC)   Cigarette smoker   Peripheral vascular disease (HCC)   S/P BKA (below knee amputation), right (HCC)   Abscess of left lower leg   Malnutrition of moderate degree   Scheduled Meds:  aspirin EC  81 mg Oral Daily   atorvastatin  80 mg Oral QHS   citalopram  40 mg Oral QHS   clonazePAM  0.5 mg Oral BID   clopidogrel  75 mg Oral QHS   docusate sodium  100 mg Oral BID   enoxaparin (LOVENOX) injection  40 mg Subcutaneous Q24H   feeding supplement  237 mL Oral BID WC   gabapentin  600 mg Oral TID   insulin aspart  0-15 Units Subcutaneous TID WC   insulin aspart  0-5 Units Subcutaneous QHS   insulin glargine-yfgn  28 Units Subcutaneous QHS   leptospermum manuka honey  1 application. Topical Daily   multivitamin with minerals  1 tablet Oral Daily   nicotine  21 mg Transdermal Daily   nutrition supplement (JUVEN)  1 packet Oral  BID BM   Continuous Infusions:  sodium chloride 75 mL/hr at 12/28/21 2049   sodium chloride     methocarbamol (ROBAXIN) IV     vancomycin 1,000 mg (12/29/21 0538)   PRN Meds:.[DISCONTINUED] acetaminophen **OR** acetaminophen, acetaminophen, albuterol, bisacodyl, bisacodyl, hydrALAZINE, HYDROmorphone (DILAUDID) injection, HYDROmorphone, methocarbamol **OR** methocarbamol (ROBAXIN) IV, metoCLOPramide **OR** metoCLOPramide (REGLAN) injection, ondansetron **OR** ondansetron (ZOFRAN) IV, oxyCODONE, polyethylene glycol  HPI: Cody Pena is a 61 y.o. 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  Patient reports a small purulent abscess on his left forearm about 2 weeks ago.  After he squeezed out pus, another small abscess formed right next to it.  He then developed another pustule on the left medial knee that became painful.  He denies fever or chills, nausea or vomiting.  No recent oral procedure or oral pain.  Said that he smoked "too much".  Denies alcohol or drug use occluding IV drug use.  Patient denies past history of MRSA.  Denies any wounds or trauma to the knee. No recent travel or go in the woods.  Currently states that he is doing well.  Still have pains in the right knee but otherwise has no other issue.  Review of Systems: ROS Per HPI  Past Medical History:  Diagnosis Date   COVID    has had Covid 3 times, not sick with it   Depression    Diabetes mellitus without complication (HCC)    pt states he has now been told that he is a type 1 diabetic.   Hypertension    Peripheral vascular disease [facility])    Past Surgical History:  Procedure Laterality Date   ABDOMINAL AORTOGRAM W/LOWER EXTREMITY N/A 08/11/2021   Procedure: ABDOMINAL AORTOGRAM W/LOWER EXTREMITY;  Surgeon: Leonie Douglas, MD;  Location: [facility];  Service: Cardiovascular;  Laterality: N/A;   AMPUTATION Left 11/15/2017   Procedure: LEFT FOOT 2ND RAY AMPUTATION;  Surgeon: Nadara Mustard, MD;  Location: [facility] OR;  Service: Orthopedics;  Laterality: Left;   AMPUTATION Right 08/25/2021   Procedure: RIGHT TRANSMETATARSAL AMPUTATION;  Surgeon: Nadara Mustard, MD;  Location: [facility] OR;  Service: Orthopedics;  Laterality: Right;   AMPUTATION Right 09/27/2021   Procedure: BELOW KNEE AMPUTATION;  Surgeon: Nadara Mustard, MD;  Location: [facility] OR;  Service: Orthopedics;  Laterality: Right;   CERVICAL FUSION     IR ANGIOGRAM EXTREMITY LEFT  11/14/2017   IR ANGIOGRAM SELECTIVE EACH ADDITIONAL VESSEL   11/14/2017   IR ANGIOGRAM SELECTIVE EACH ADDITIONAL VESSEL  11/14/2017   IR TIB-PERO ART PTA MOD SED  11/14/2017   IR TIB-PERO ART UNI PTA EA ADD VESSEL MOD SED  11/14/2017   IR US GUIDE VASC ACCESS LEFT  11/14/2017   PERIPHERAL VASCULAR INTERVENTION Right 08/11/2021   Procedure: PERIPHERAL VASCULAR INTERVENTION;  Surgeon: Leonie Douglas, MD;  Location: [facility];  Service: Cardiovascular;  Laterality: Right;  rt sfa stent    Social History   Tobacco Use   Smoking status: Every Day    Packs/day: 0.50    Years: 47.00    Pack years: 23.50    Types: Cigarettes    Last attempt to quit: 08/11/2021    Years since quitting: 0.3   Smokeless tobacco: Never  Vaping Use   Vaping Use: Some days  Substance Use Topics   Alcohol use:  No   Drug use: No    History reviewed. No pertinent family history. Allergies  Allergen Reactions   Ceftriaxone Itching, Nausea And Vomiting and Rash    Noted minutes after starting medication    OBJECTIVE: Blood pressure (!) 146/78, pulse 83, temperature 98.6 F (37 C), resp. rate 19, height  (1.93 m), weight 101.2 kg, SpO2 95 %.  Physical Exam Constitutional:      General: He is not in acute distress.    Appearance: He is not ill-appearing.  HENT:     Head: Normocephalic.  Eyes:     General:        Right eye: No discharge.        Left eye: No discharge.     Conjunctiva/sclera: Conjunctivae normal.  Cardiovascular:     Rate and Rhythm: Normal rate and regular rhythm.  Pulmonary:     Effort: Pulmonary effort is normal. No respiratory distress.     Breath sounds: Normal breath sounds.  Musculoskeletal:     Cervical back: Normal range of motion.     Comments: Right BKA. Left knee bandaged with wound VAC in place, draining bloody fluid.  Left foot warm to touch.  Skin:    General: Skin is warm.  Neurological:     General: No focal deficit present.     Mental Status: He is alert and oriented to person, place, and time.  Psychiatric:         Mood and Affect: Mood normal.        Behavior: Behavior normal.    Lab Results Lab Results  Component Value Date   WBC 8.9 12/29/2021   HGB 13.3 12/29/2021   HCT 40.1 12/29/2021   MCV 94.6 12/29/2021   PLT PLATELET CLUMPS NOTED ON SMEAR, UNABLE TO ESTIMATE 12/29/2021    Lab Results  Component Value Date   CREATININE 1.08 12/29/2021   BUN 20 12/29/2021   NA 137 12/29/2021   K 4.1 12/29/2021   CL 96 (L) 12/29/2021   CO2 34 (H) 12/29/2021    Lab Results  Component Value Date   ALT 12 12/29/2021   AST 9 (L) 12/29/2021   ALKPHOS 77 12/29/2021   BILITOT 0.3 12/29/2021     Microbiology: Recent Results (from the past 240 hour(s))  Resp Panel by RT-PCR (Flu A&B, Covid) Nasopharyngeal Swab     Status: None   Collection Time: 12/25/21 10:22 PM   Specimen: Nasopharyngeal Swab; Nasopharyngeal(NP) swabs in vial transport medium  Result Value Ref Range Status   SARS Coronavirus 2 by RT PCR NEGATIVE NEGATIVE Final    Comment: (NOTE) SARS-CoV-2 target nucleic acids are NOT DETECTED.  The SARS-CoV-2 RNA is generally detectable in upper respiratory specimens during the acute phase of infection. The lowest concentration of SARS-CoV-2 viral copies this assay can detect is 138 copies/mL. A negative result does not preclude SARS-Cov-2 infection and should not be used as the sole basis for treatment or other patient management decisions. A negative result may occur with  improper specimen collection/handling, submission of specimen other than nasopharyngeal swab, presence of viral mutation(s) within the areas targeted by this assay, and inadequate number of viral copies(<138 copies/mL). A negative result must be combined with clinical observations, patient history, and epidemiological information. The expected result is Negative.  Fact Sheet for Patients:  BloggerCourse.com  Fact Sheet for Healthcare Providers:   SeriousBroker.it  This test is no t yet approved or cleared by the [place] FDA and  has been  authorized for detection and/or diagnosis of SARS-CoV-2 by FDA under an Emergency Use Authorization (EUA). This EUA will remain  in effect (meaning this test can be used) for the duration of the COVID-19 declaration under Section 564(b)(1) of the Act, 21 U.S.C.section 360bbb-3(b)(1), unless the authorization is terminated  or revoked sooner.       Influenza A by PCR NEGATIVE NEGATIVE Final   Influenza B by PCR NEGATIVE NEGATIVE Final    Comment: (NOTE) The Xpert Xpress SARS-CoV-2/FLU/RSV plus assay is intended as an aid in the diagnosis of influenza from Nasopharyngeal swab specimens and should not be used as a sole basis for treatment. Nasal washings and aspirates are unacceptable for Xpert Xpress SARS-CoV-2/FLU/RSV testing.  Fact Sheet for Patients: BloggerCourse.com  Fact Sheet for Healthcare Providers: SeriousBroker.it  This test is not yet approved or cleared by the [place] FDA and has been authorized for detection and/or diagnosis of SARS-CoV-2 by FDA under an Emergency Use Authorization (EUA). This EUA will remain in effect (meaning this test can be used) for the duration of the COVID-19 declaration under Section 564(b)(1) of the Act, 21 U.S.C. section 360bbb-3(b)(1), unless the authorization is terminated or revoked.  Performed at [facility], [address], [place], [place] [address]   Surgical pcr screen     Status: None   Collection Time: 12/25/21 10:22 PM   Specimen: Nasal Mucosa; Nasal Swab  Result Value Ref Range Status   MRSA, PCR NEGATIVE NEGATIVE Final   Staphylococcus aureus NEGATIVE NEGATIVE Final    Comment: (NOTE) The Xpert SA Assay (FDA approved for NASAL specimens in patients 63 years of age and older), is one component of a comprehensive surveillance program.  It is not intended to diagnose infection nor to guide or monitor treatment. Performed at [facility], [address], [place], [place] [address]   Blood culture (routine x 2)     Status: None (Preliminary result)   Collection Time: 12/25/21 10:45 PM   Specimen: BLOOD  Result Value Ref Range Status   Specimen Description BLOOD BLOOD RIGHT WRIST  Final   Special Requests   Final    BOTTLES DRAWN AEROBIC AND ANAEROBIC Blood Culture adequate volume   Culture   Final    NO GROWTH 4 DAYS Performed at [facility], [address], [place], [place] [address]    Report Status PENDING  Incomplete  Blood culture (routine x 2)     Status: Abnormal   Collection Time: 12/25/21 10:47 PM   Specimen: BLOOD  Result Value Ref Range Status   Specimen Description BLOOD RIGHT ANTECUBITAL  Final   Special Requests   Final    BOTTLES DRAWN AEROBIC AND ANAEROBIC Blood Culture adequate volume   Culture  Setup Time   Final    GRAM POSITIVE COCCI AEROBIC BOTTLE ONLY CRITICAL RESULT CALLED TO, READ BACK BY AND VERIFIED WITH: V BRYK,[email or web address]@[id number]  12/27/21 Performed at [facility], [address], [place], [place] [address]    Culture STAPHYLOCOCCUS SAPROPHYTICUS (A)  Final   Report Status 12/29/2021 FINAL  Final   Organism ID, Bacteria STAPHYLOCOCCUS SAPROPHYTICUS  Final      Susceptibility   Staphylococcus saprophyticus - MIC*    CIPROFLOXACIN <=0.5 SENSITIVE Sensitive     ERYTHROMYCIN >=8 RESISTANT Resistant     GENTAMICIN <=0.5 SENSITIVE Sensitive     OXACILLIN >=4 RESISTANT Resistant     TETRACYCLINE <=1 SENSITIVE Sensitive     VANCOMYCIN <=0.5 SENSITIVE Sensitive  TRIMETH/SULFA <=10 SENSITIVE Sensitive     CLINDAMYCIN RESISTANT Resistant     RIFAMPIN <=0.5 SENSITIVE Sensitive     Inducible Clindamycin POSITIVE Resistant     * STAPHYLOCOCCUS SAPROPHYTICUS  Blood Culture ID Panel (Reflexed)     Status: Abnormal   Collection Time: 12/25/21 10:47 PM  Result Value Ref Range  Status   Enterococcus faecalis NOT DETECTED NOT DETECTED Final   Enterococcus Faecium NOT DETECTED NOT DETECTED Final   Listeria monocytogenes NOT DETECTED NOT DETECTED Final   Staphylococcus species DETECTED (A) NOT DETECTED Final    Comment: CRITICAL RESULT CALLED TO, READ BACK BY AND VERIFIED WITH: V BRYK,PHARMD@[id number]  12/27/21 MK    Staphylococcus aureus (BCID) NOT DETECTED NOT DETECTED Final   Staphylococcus epidermidis NOT DETECTED NOT DETECTED Final   Staphylococcus lugdunensis NOT DETECTED NOT DETECTED Final   Streptococcus species NOT DETECTED NOT DETECTED Final   Streptococcus agalactiae NOT DETECTED NOT DETECTED Final   Streptococcus pneumoniae NOT DETECTED NOT DETECTED Final   Streptococcus pyogenes NOT DETECTED NOT DETECTED Final   A.calcoaceticus-baumannii NOT DETECTED NOT DETECTED Final   Bacteroides fragilis NOT DETECTED NOT DETECTED Final   Enterobacterales NOT DETECTED NOT DETECTED Final   Enterobacter cloacae complex NOT DETECTED NOT DETECTED Final   Escherichia coli NOT DETECTED NOT DETECTED Final   Klebsiella aerogenes NOT DETECTED NOT DETECTED Final   Klebsiella oxytoca NOT DETECTED NOT DETECTED Final   Klebsiella pneumoniae NOT DETECTED NOT DETECTED Final   Proteus species NOT DETECTED NOT DETECTED Final   Salmonella species NOT DETECTED NOT DETECTED Final   Serratia marcescens NOT DETECTED NOT DETECTED Final   Haemophilus influenzae NOT DETECTED NOT DETECTED Final   Neisseria meningitidis NOT DETECTED NOT DETECTED Final   Pseudomonas aeruginosa NOT DETECTED NOT DETECTED Final   Stenotrophomonas maltophilia NOT DETECTED NOT DETECTED Final   Candida albicans NOT DETECTED NOT DETECTED Final   Candida auris NOT DETECTED NOT DETECTED Final   Candida glabrata NOT DETECTED NOT DETECTED Final   Candida krusei NOT DETECTED NOT DETECTED Final   Candida parapsilosis NOT DETECTED NOT DETECTED Final   Candida tropicalis NOT DETECTED NOT DETECTED Final   Cryptococcus  neoformans/gattii NOT DETECTED NOT DETECTED Final    Comment: Performed at [facility], [address] [place], [place] [address]  Aerobic/Anaerobic Culture w Gram Stain (surgical/deep wound)     Status: None (Preliminary result)   Collection Time: 12/27/21 12:02 PM   Specimen: Abscess  Result Value Ref Range Status   Specimen Description ABSCESS  Final   Special Requests ABSCESS LEFT KNEE SPEC A  Final   Gram Stain   Final    RARE WBC PRESENT, PREDOMINANTLY PMN RARE GRAM POSITIVE COCCI IN PAIRS Performed at [facility], [address] [place], [place] [address]    Culture   Final    MODERATE METHICILLIN RESISTANT STAPHYLOCOCCUS AUREUS   Report Status PENDING  Incomplete   Organism ID, Bacteria METHICILLIN RESISTANT STAPHYLOCOCCUS AUREUS  Final      Susceptibility   Methicillin resistant staphylococcus aureus - MIC*    CIPROFLOXACIN >=8 RESISTANT Resistant     ERYTHROMYCIN >=8 RESISTANT Resistant     GENTAMICIN <=0.5 SENSITIVE Sensitive     OXACILLIN >=4 RESISTANT Resistant     TETRACYCLINE <=1 SENSITIVE Sensitive     VANCOMYCIN 1 SENSITIVE Sensitive     TRIMETH/SULFA 160 RESISTANT Resistant     CLINDAMYCIN <=0.25 SENSITIVE Sensitive     RIFAMPIN <=0.5 SENSITIVE Sensitive  Inducible Clindamycin NEGATIVE Sensitive     * MODERATE METHICILLIN RESISTANT STAPHYLOCOCCUS AUREUS   Imaging CT KNEE LEFT W CONTRAST  Result Date: 12/26/2021 CLINICAL DATA:  Soft tissue infection suspected, knee, xray done EXAM: CT OF THE LEFT KNEE WITH CONTRAST TECHNIQUE: Multidetector CT imaging was performed following the standard protocol during bolus administration of intravenous contrast. RADIATION DOSE REDUCTION: This exam was performed according to the departmental dose-optimization program which includes automated exposure control, adjustment of the mA and/or kV according to patient size and/or use of iterative reconstruction technique. CONTRAST:  OMNIPAQUE IOHEXOL 350 MG/ML SOLN  COMPARISON:  X-ray 12/26/2021 FINDINGS: Bones/Joint/Cartilage No acute fracture. No dislocation. 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 Findings are most pronounced within the medial compartment. Trace knee joint effusion, nonspecific. No bone erosion or periosteal elevation is evident. No lytic or sclerotic bone lesion identified. Ligaments Suboptimally assessed by CT. Muscles and Tendons Musculotendinous structures appear within normal limits by CT. No intramuscular fluid collection is evident. Soft tissues Soft tissue swelling with skin thickening and extensive subcutaneous edema overlying the medial aspect of the knee. Slightly more focal area of fluid at this location measuring approximately 3.0 x 1.5 x 3.0 cm (series 4, image 71; series 9, image 62), suggesting of a phlegmon/developing abscess. No well-defined enhancing rim at this time. No soft tissue gas. No deep fascial fluid collections. Partially visualized distal superficial femoral arterial stent, which appears occluded (series 4, image 1). There is collateral reconstitution of the popliteal artery (series 4, image 30). IMPRESSION: 1. Soft tissue swelling with skin thickening and extensive subcutaneous edema overlying the medial aspect of the left knee, most compatible with cellulitis. Slightly more focal area of fluid at this location measuring approximately 3.0 x 1.5 x 3.0 cm, suggesting a phlegmon/developing abscess. No well-defined enhancing rim at this time. No soft tissue gas. 2. 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 Trace left knee joint effusion is likely reactive. 3. Distal superficial femoral artery appears occluded. There is collateral reconstitution of the popliteal artery. Consider lower extremity arterial ultrasound for further evaluation.  Electronically Signed   By: Duanne Guess D.O.   On: 12/26/2021 12:06   DG Knee Complete 4 Views Left  Result Date: 12/26/2021 CLINICAL DATA:  61 year old male with infection. History of critical limb ischemia left foot status post revascularization in 2019. EXAM: LEFT KNEE - COMPLETE 4+ VIEW COMPARISON:  No prior knee series. FINDINGS: Four portable views of the left knee. Mid/distal femoral artery vascular stent is partially visible. Mild tricompartmental degenerative joint space loss and spurring at the left knee. No acute osseous abnormality identified. No definite joint effusion. No soft tissue gas identified. IMPRESSION: 1. 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 No joint effusion or acute osseous abnormality identified. 2. Left femoral artery vascular stent. Electronically Signed   By: Odessa Fleming M.D.   On: 12/26/2021 06:14     Doran Stabler, MD [facility]  12/29/2021, 11:18 AM

## 2021-12-29 NOTE — TOC Transition Note (Signed)
Transition of Care [facility]) - CM/SW Discharge Note   Patient Details  Name: Cody Pena MRN: [id number] Date of Birth: 11/04/1960  Transition of Care [facility]) CM/SW Contact:  Sharin Mons, RN Phone Number: 12/29/2021, 12:19 PM   Clinical Narrative:    Patient will DC to: home Anticipated DC date: 12/29/2021 Family notified: yes  Transport by: car         - s/p [facility] , [place], 5/17 Per MD patient ready for DC today. RN, patient, patient's family, and facility notified of DC. NCM spoke with pt regarding  no PCP. Pt states old provider closed practice and no referrals were made for pt to establish care somewhere else. Appointment made with with [facility] and noted on AVS. Orders noted for home health services, pt declined.  Pt without Rx med concerns Wife to provide transportation to home. RNCM will sign off for now as intervention is no longer needed. Please consult [place] again if new needs arise.    Final next level of care: Home/Self Care Barriers to Discharge: No Barriers Identified   Patient Goals and CMS Choice     Choice offered to / list presented to : Patient  Discharge Placement                       Discharge Plan and Services                          HH Arranged: PT [facility] Agency: [place] Date [place]: 12/28/21 Time [place]: 1143 Representative spoke with at [place]: [place] Determinants of Health ([place]) Interventions     Readmission Risk Interventions     View : No data to display.

## 2021-12-30 LAB — CULTURE, BLOOD (ROUTINE X 2)
Culture: NO GROWTH
Special Requests: ADEQUATE

## 2022-01-01 ENCOUNTER — Encounter: Payer: 59 | Admitting: Orthopedic Surgery

## 2022-01-01 LAB — AEROBIC/ANAEROBIC CULTURE W GRAM STAIN (SURGICAL/DEEP WOUND)

## 2022-01-03 ENCOUNTER — Other Ambulatory Visit: Payer: Self-pay | Admitting: Orthopedic Surgery

## 2022-01-04 ENCOUNTER — Ambulatory Visit (INDEPENDENT_AMBULATORY_CARE_PROVIDER_SITE_OTHER): Payer: 59

## 2022-01-04 ENCOUNTER — Ambulatory Visit (INDEPENDENT_AMBULATORY_CARE_PROVIDER_SITE_OTHER): Payer: 59 | Admitting: Orthopedic Surgery

## 2022-01-04 DIAGNOSIS — Z89511 Acquired absence of right leg below knee: Secondary | ICD-10-CM

## 2022-01-09 ENCOUNTER — Encounter: Payer: Self-pay | Admitting: Orthopedic Surgery

## 2022-01-09 ENCOUNTER — Ambulatory Visit: Payer: 59 | Admitting: Adult Health

## 2022-01-09 NOTE — Progress Notes (Deleted)
Patient presents to clinic today to establish care. He is a 61 year old male who  has a past medical history of COVID, Depression, Diabetes mellitus without complication (HCC), Hypertension, and Peripheral vascular disease (HCC).   Acute Concerns: Establish Care  Chronic Issues:  PVD -recent right BKA in 09/2021.  Managed with Plavix 75 mg daily, aspirin 81 mg daily, and atorvastatin 80 mg nightly.  DM Type 2 -A1c in February 2022 was 9.2.  Is on Semglee 2 units daily with NovoLog sliding scale insulin before meals and at bedtime.   Lab Results  Component Value Date   HGBA1C 9.2 (H) 09/27/2021   COPD with ongoing tobacco use -is his albuterol inhaler every 6 hours as needed for shortness of breath.  Anxiety and Depression -managed with citalopram 40 mg nightly and Klonopin 0.5 mg twice daily.  Health Maintenance: Dental -- Vision -- Immunizations -- Colonoscopy -- Mammogram -- PAP --  Bone Density --    Past Medical History:  Diagnosis Date   COVID    has had Covid 3 times, not sick with it   Depression    Diabetes mellitus without complication (HCC)    pt states he has now been told that he is a type 1 diabetic.   Hypertension    Peripheral vascular disease [facility])     Past Surgical History:  Procedure Laterality Date   ABDOMINAL AORTOGRAM W/LOWER EXTREMITY N/A 08/11/2021   Procedure: ABDOMINAL AORTOGRAM W/LOWER EXTREMITY;  Surgeon: Leonie Douglas, MD;  [facility];  Service: Cardiovascular;  Laterality: N/A;   AMPUTATION Left 11/15/2017   Procedure: LEFT FOOT 2ND RAY AMPUTATION;  Surgeon: Nadara Mustard, MD;  Location: [facility] OR;  Service: Orthopedics;  Laterality: Left;   AMPUTATION Right 08/25/2021   Procedure: RIGHT TRANSMETATARSAL AMPUTATION;  Surgeon: Nadara Mustard, MD;  Location: [facility] OR;  Service: Orthopedics;  Laterality: Right;   AMPUTATION Right 09/27/2021   Procedure: BELOW KNEE AMPUTATION;  Surgeon: Nadara Mustard, MD;  Location: [facility] OR;   Service: Orthopedics;  Laterality: Right;   APPLICATION OF WOUND VAC  12/27/2021   Procedure: APPLICATION OF WOUND VAC;  Surgeon: Nadara Mustard, MD;  Location: [facility] OR;  Service: Orthopedics;;   CERVICAL FUSION     I & D EXTREMITY Left 12/27/2021   Procedure: LEFT LEG DEBRIDEMENT ULCERS;  Surgeon: Nadara Mustard, MD;  Location: [facility] OR;  Service: Orthopedics;  Laterality: Left;   IR ANGIOGRAM EXTREMITY LEFT  11/14/2017   IR ANGIOGRAM SELECTIVE EACH ADDITIONAL VESSEL  11/14/2017   IR ANGIOGRAM SELECTIVE EACH ADDITIONAL VESSEL  11/14/2017   IR TIB-PERO ART PTA MOD SED  11/14/2017   IR TIB-PERO ART UNI PTA EA ADD VESSEL MOD SED  11/14/2017   IR US GUIDE VASC ACCESS LEFT  11/14/2017   PERIPHERAL VASCULAR INTERVENTION Right 08/11/2021   Procedure: PERIPHERAL VASCULAR INTERVENTION;  Surgeon: Leonie Douglas, MD;  [facility];  Service: Cardiovascular;  Laterality: Right;  rt sfa stent    Current Outpatient Medications on File Prior to Visit  Medication Sig Dispense Refill   acetaminophen (TYLENOL) 325 MG tablet Take 1-2 tablets (325-650 mg total) by mouth every 4 (four) hours as needed for mild pain.     albuterol (VENTOLIN HFA) 108 (90 Base) MCG/ACT inhaler Inhale 2 puffs into the lungs every 6 (six) hours as needed for shortness of breath. 8 g 0   aspirin 81 MG EC tablet Take 1 tablet (81  mg total) by mouth daily. Swallow whole. 150 tablet 2   atorvastatin (LIPITOR) 80 MG tablet Take 1 tablet (80 mg total) by mouth daily. (Patient taking differently: Take 80 mg by mouth at bedtime.) 30 tablet 3   citalopram (CELEXA) 40 MG tablet Take 40 mg by mouth at bedtime.     clonazePAM (KLONOPIN) 0.5 MG tablet Take 1 tablet (0.5 mg total) by mouth 2 (two) times daily. 60 tablet 0   clopidogrel (PLAVIX) 75 MG tablet Take 75 mg by mouth at bedtime.     docusate sodium (COLACE) 100 MG capsule Take 1 capsule (100 mg total) by mouth 2 (two) times daily. 10 capsule 0   doxycycline (VIBRA-TABS) 100 MG tablet  Take 1 tablet (100 mg total) by mouth 2 (two) times daily for 14 days. 28 tablet 0   feeding supplement (ENSURE ENLIVE / ENSURE PLUS) LIQD Take 237 mLs by mouth 2 (two) times daily with a meal. 237 mL 12   gabapentin (NEURONTIN) 600 MG tablet Take 1 tablet (600 mg total) by mouth 3 (three) times daily. 90 tablet 3   HUMALOG KWIKPEN 100 UNIT/ML KwikPen Inject 0-12 Units into the skin 3 (three) times daily.     LANTUS 100 UNIT/ML injection Inject 24 Units into the skin at bedtime.     leptospermum manuka honey (MEDIHONEY) PSTE paste Apply 1 application. topically daily. 44 mL 0   metFORMIN (GLUCOPHAGE) 500 MG tablet Take 1 tablet (500 mg total) by mouth 2 (two) times daily with a meal. 60 tablet 11   methocarbamol (ROBAXIN) 500 MG tablet Take 1 tablet (500 mg total) by mouth every 6 (six) hours as needed for muscle spasms. 60 tablet 0   Multiple Vitamin (MULTIVITAMIN WITH MINERALS) TABS tablet Take 1 tablet by mouth daily. 30 tablet 0   nicotine (NICODERM CQ - DOSED IN MG/24 HOURS) 21 mg/24hr patch 21 mg patch daily x2 weeks then 14 mg patch daily x3 weeks then 7 mg patch daily x3 weeks and stop 28 patch 0   nutrition supplement, JUVEN, (JUVEN) PACK Take 1 packet by mouth 2 (two) times daily between meals. 30 each 0   ondansetron (ZOFRAN) 4 MG tablet Take 1 tablet (4 mg total) by mouth every 6 (six) hours as needed for nausea. 20 tablet 0   oxyCODONE-acetaminophen (PERCOCET) 10-325 MG tablet TAKE 1 TABLET BY MOUTH EVERY 4 HOURS AS NEEDED FOR PAIN. 30 tablet 0   polyethylene glycol powder (GLYCOLAX/MIRALAX) 17 GM/SCOOP powder Take 1 capful (17 g) by mouth daily as needed for mild constipation. 238 g 0   Vitamin D, Ergocalciferol, (DRISDOL) 1.25 MG (50000 UNIT) CAPS capsule Take 1 capsule (50,000 Units total) by mouth every 7 (seven) days. (Patient taking differently: Take 50,000 Units by mouth every Wednesday.) 5 capsule 0   No current facility-administered medications on file prior to visit.     Allergies  Allergen Reactions   Ceftriaxone Itching, Nausea And Vomiting and Rash    Noted minutes after starting medication    No family history on file.  Social History   Socioeconomic History   Marital status: Married    Spouse name: Not on file   Number of children: Not on file   Years of education: Not on file   Highest education level: Not on file  Occupational History   Not on file  Tobacco Use   Smoking status: Every Day    Packs/day: 0.50    Years: 47.00    Pack years: 23.50  Types: Cigarettes    Last attempt to quit: 08/11/2021    Years since quitting: 0.4   Smokeless tobacco: Never  Vaping Use   Vaping Use: Some days  Substance and Sexual Activity   Alcohol use: No   Drug use: No   Sexual activity: Not on file  Other Topics Concern   Not on file  Social History Narrative   Not on file   Social Determinants of Health   Financial Resource Strain: Not on file  Food Insecurity: Not on file  Transportation Needs: Not on file  Physical Activity: Not on file  Stress: Not on file  Social Connections: Not on file  Intimate Partner Violence: Not on file    ROS  There were no vitals taken for this visit.  Physical Exam  Recent Results (from the past 2160 hour(s))  Resp Panel by RT-PCR (Flu A&B, Covid) Nasopharyngeal Swab     Status: None   Collection Time: 12/25/21 10:22 PM   Specimen: Nasopharyngeal Swab; Nasopharyngeal(NP) swabs in vial transport medium  Result Value Ref Range   SARS Coronavirus 2 by RT PCR NEGATIVE NEGATIVE    Comment: (NOTE) SARS-CoV-2 target nucleic acids are NOT DETECTED.  The SARS-CoV-2 RNA is generally detectable in upper respiratory specimens during the acute phase of infection. The lowest concentration of SARS-CoV-2 viral copies this assay can detect is 138 copies/mL. A negative result does not preclude SARS-Cov-2 infection and should not be used as the sole basis for treatment or other patient management  decisions. A negative result may occur with  improper specimen collection/handling, submission of specimen other than nasopharyngeal swab, presence of viral mutation(s) within the areas targeted by this assay, and inadequate number of viral copies(<138 copies/mL). A negative result must be combined with clinical observations, patient history, and epidemiological information. The expected result is Negative.  Fact Sheet for Patients:  BloggerCourse.com  Fact Sheet for Healthcare Providers:  SeriousBroker.it  This test is no t yet approved or cleared by the [place] FDA and  has been authorized for detection and/or diagnosis of SARS-CoV-2 by FDA under an Emergency Use Authorization (EUA). This EUA will remain  in effect (meaning this test can be used) for the duration of the COVID-19 declaration under Section 564(b)(1) of the Act, 21 U.S.C.section 360bbb-3(b)(1), unless the authorization is terminated  or revoked sooner.       Influenza A by PCR NEGATIVE NEGATIVE   Influenza B by PCR NEGATIVE NEGATIVE    Comment: (NOTE) The Xpert Xpress SARS-CoV-2/FLU/RSV plus assay is intended as an aid in the diagnosis of influenza from Nasopharyngeal swab specimens and should not be used as a sole basis for treatment. Nasal washings and aspirates are unacceptable for Xpert Xpress SARS-CoV-2/FLU/RSV testing.  Fact Sheet for Patients: BloggerCourse.com  Fact Sheet for Healthcare Providers: SeriousBroker.it  This test is not yet approved or cleared by the [place] FDA and has been authorized for detection and/or diagnosis of SARS-CoV-2 by FDA under an Emergency Use Authorization (EUA). This EUA will remain in effect (meaning this test can be used) for the duration of the COVID-19 declaration under Section 564(b)(1) of the Act, 21 U.S.C. section 360bbb-3(b)(1), unless the authorization  is terminated or revoked.  Performed at [facility], [address], [place], [place] [address]   Surgical pcr screen     Status: None   Collection Time: 12/25/21 10:22 PM   Specimen: Nasal Mucosa; Nasal Swab  Result Value Ref Range   MRSA, PCR  NEGATIVE NEGATIVE   Staphylococcus aureus NEGATIVE NEGATIVE    Comment: (NOTE) The Xpert SA Assay (FDA approved for NASAL specimens in patients 28 years of age and older), is one component of a comprehensive surveillance program. It is not intended to diagnose infection nor to guide or monitor treatment. Performed at [facility], [address] [place], [place] [address]   CBC with Differential     Status: Abnormal   Collection Time: 12/25/21 10:35 PM  Result Value Ref Range   WBC 13.3 (H) 4.0 - 10.5 K/uL   RBC 5.01 4.22 - 5.81 MIL/uL   Hemoglobin 15.9 13.0 - 17.0 g/dL   HCT 60.4 54.0 - 98.1 %   MCV 96.4 80.0 - 100.0 fL   MCH 31.7 26.0 - 34.0 pg   MCHC 32.9 30.0 - 36.0 g/dL   RDW 19.1 47.8 - 29.5 %   Platelets 150 150 - 400 K/uL   nRBC 0.0 0.0 - 0.2 %   Neutrophils Relative % 73 %   Neutro Abs 9.8 (H) 1.7 - 7.7 K/uL   Lymphocytes Relative 16 %   Lymphs Abs 2.2 0.7 - 4.0 K/uL   Monocytes Relative 6 %   Monocytes Absolute 0.8 0.1 - 1.0 K/uL   Eosinophils Relative 3 %   Eosinophils Absolute 0.4 0.0 - 0.5 K/uL   Basophils Relative 1 %   Basophils Absolute 0.1 0.0 - 0.1 K/uL   Immature Granulocytes 1 %   Abs Immature Granulocytes 0.06 0.00 - 0.07 K/uL    Comment: Performed at [facility], [address], [place], [place] [address]  Comprehensive metabolic panel     Status: Abnormal   Collection Time: 12/25/21 10:35 PM  Result Value Ref Range   Sodium 138 135 - 145 mmol/L   Potassium 3.6 3.5 - 5.1 mmol/L   Chloride 102 98 - 111 mmol/L   CO2 28 22 - 32 mmol/L   Glucose, Bld 286 (H) 70 - 99 mg/dL    Comment: Glucose reference range applies only to samples taken after fasting for at least 8 hours.   BUN 6 (L)  8 - 23 mg/dL   Creatinine, Ser 8.65 0.61 - 1.24 mg/dL   Calcium 8.5 (L) 8.9 - 10.3 mg/dL   Total Protein 6.1 (L) 6.5 - 8.1 g/dL   Albumin 3.2 (L) 3.5 - 5.0 g/dL   AST 14 (L) 15 - 41 U/L   ALT 17 0 - 44 U/L   Alkaline Phosphatase 116 38 - 126 U/L   Total Bilirubin 0.6 0.3 - 1.2 mg/dL   GFR, Estimated >78 >46 mL/min    Comment: (NOTE) Calculated using the CKD-EPI Creatinine Equation (2021)    Anion gap 8 5 - 15    Comment: Performed at [facility], [address] [place], [place] [address]  POC CBG, ED     Status: Abnormal   Collection Time: 12/25/21 10:37 PM  Result Value Ref Range   Glucose-Capillary 305 (H) 70 - 99 mg/dL    Comment: Glucose reference range applies only to samples taken after fasting for at least 8 hours.  Urinalysis, Routine w reflex microscopic     Status: Abnormal   Collection Time: 12/25/21 10:40 PM  Result Value Ref Range   Color, Urine YELLOW YELLOW   APPearance CLEAR CLEAR   Specific Gravity, Urine 1.015 1.005 - 1.030   pH 6.0 5.0 - 8.0   Glucose, UA >=500 (A) NEGATIVE mg/dL   Hgb  urine dipstick NEGATIVE NEGATIVE   Bilirubin Urine NEGATIVE NEGATIVE   Ketones, ur NEGATIVE NEGATIVE mg/dL   Protein, ur NEGATIVE NEGATIVE mg/dL   Nitrite NEGATIVE NEGATIVE   Leukocytes,Ua NEGATIVE NEGATIVE   WBC, UA 0-5 0 - 5 WBC/hpf   Bacteria, UA NONE SEEN NONE SEEN   Mucus PRESENT     Comment: Performed at [facility], [address], [place], [place] [address]  Lactic acid, plasma     Status: None   Collection Time: 12/25/21 10:44 PM  Result Value Ref Range   Lactic Acid, Venous 1.8 0.5 - 1.9 mmol/L    Comment: Performed at [facility], [address], [place], [place] [address]  Blood culture (routine x 2)     Status: None   Collection Time: 12/25/21 10:45 PM   Specimen: BLOOD  Result Value Ref Range   Specimen Description BLOOD BLOOD RIGHT WRIST    Special Requests      BOTTLES DRAWN AEROBIC AND ANAEROBIC Blood Culture adequate volume    Culture      NO GROWTH 5 DAYS Performed at [facility], [address] [place], [place] [address]    Report Status 12/30/2021 FINAL   Blood culture (routine x 2)     Status: Abnormal   Collection Time: 12/25/21 10:47 PM   Specimen: BLOOD  Result Value Ref Range   Specimen Description BLOOD RIGHT ANTECUBITAL    Special Requests      BOTTLES DRAWN AEROBIC AND ANAEROBIC Blood Culture adequate volume   Culture  Setup Time      GRAM POSITIVE COCCI AEROBIC BOTTLE ONLY CRITICAL RESULT CALLED TO, READ BACK BY AND VERIFIED WITH: V BRYK,PHARMD@[id number]  12/27/21 Performed at [facility], [address] [place], [place] [address]    Culture STAPHYLOCOCCUS SAPROPHYTICUS (A)    Report Status 12/29/2021 FINAL    Organism ID, Bacteria STAPHYLOCOCCUS SAPROPHYTICUS       Susceptibility   Staphylococcus saprophyticus - MIC*    CIPROFLOXACIN <=0.5 SENSITIVE Sensitive     ERYTHROMYCIN >=8 RESISTANT Resistant     GENTAMICIN <=0.5 SENSITIVE Sensitive     OXACILLIN >=4 RESISTANT Resistant     TETRACYCLINE <=1 SENSITIVE Sensitive     VANCOMYCIN <=0.5 SENSITIVE Sensitive     TRIMETH/SULFA <=10 SENSITIVE Sensitive     CLINDAMYCIN RESISTANT Resistant     RIFAMPIN <=0.5 SENSITIVE Sensitive     Inducible Clindamycin POSITIVE Resistant     * STAPHYLOCOCCUS SAPROPHYTICUS  Blood Culture ID Panel (Reflexed)     Status: Abnormal   Collection Time: 12/25/21 10:47 PM  Result Value Ref Range   Enterococcus faecalis NOT DETECTED NOT DETECTED   Enterococcus Faecium NOT DETECTED NOT DETECTED   Listeria monocytogenes NOT DETECTED NOT DETECTED   Staphylococcus species DETECTED (A) NOT DETECTED    Comment: CRITICAL RESULT CALLED TO, READ BACK BY AND VERIFIED WITH: V BRYK,PHARMD@[email or web address]  12/27/21 MK    Staphylococcus aureus (BCID) NOT DETECTED NOT DETECTED   Staphylococcus epidermidis NOT DETECTED NOT DETECTED   Staphylococcus lugdunensis NOT DETECTED NOT DETECTED   Streptococcus species NOT DETECTED NOT  DETECTED   Streptococcus agalactiae NOT DETECTED NOT DETECTED   Streptococcus pneumoniae NOT DETECTED NOT DETECTED   Streptococcus pyogenes NOT DETECTED NOT DETECTED   A.calcoaceticus-baumannii NOT DETECTED NOT DETECTED   Bacteroides fragilis NOT DETECTED NOT DETECTED   Enterobacterales NOT DETECTED NOT DETECTED   Enterobacter cloacae complex NOT DETECTED NOT DETECTED   Escherichia coli NOT DETECTED NOT DETECTED  Klebsiella aerogenes NOT DETECTED NOT DETECTED   Klebsiella oxytoca NOT DETECTED NOT DETECTED   Klebsiella pneumoniae NOT DETECTED NOT DETECTED   Proteus species NOT DETECTED NOT DETECTED   Salmonella species NOT DETECTED NOT DETECTED   Serratia marcescens NOT DETECTED NOT DETECTED   Haemophilus influenzae NOT DETECTED NOT DETECTED   Neisseria meningitidis NOT DETECTED NOT DETECTED   Pseudomonas aeruginosa NOT DETECTED NOT DETECTED   Stenotrophomonas maltophilia NOT DETECTED NOT DETECTED   Candida albicans NOT DETECTED NOT DETECTED   Candida auris NOT DETECTED NOT DETECTED   Candida glabrata NOT DETECTED NOT DETECTED   Candida krusei NOT DETECTED NOT DETECTED   Candida parapsilosis NOT DETECTED NOT DETECTED   Candida tropicalis NOT DETECTED NOT DETECTED   Cryptococcus neoformans/gattii NOT DETECTED NOT DETECTED    Comment: Performed at [facility], [address] [place], [place] [address]  Lactic acid, plasma     Status: None   Collection Time: 12/26/21  6:47 AM  Result Value Ref Range   Lactic Acid, Venous 1.1 0.5 - 1.9 mmol/L    Comment: Performed at [facility], [address] [place], [place] [address]  Sedimentation rate     Status: Abnormal   Collection Time: 12/26/21 11:30 AM  Result Value Ref Range   Sed Rate 22 (H) 0 - 16 mm/hr    Comment: Performed at [facility], [address] [place], [place] [address]  C-reactive protein     Status: Abnormal   Collection Time: 12/26/21 11:30 AM  Result Value Ref Range   CRP 4.4 (H) <1.0 mg/dL     Comment: Performed at [facility], [address] [place], [place] [address]  Prealbumin     Status: Abnormal   Collection Time: 12/26/21 11:30 AM  Result Value Ref Range   Prealbumin 17.8 (L) 18 - 38 mg/dL    Comment: Performed at [facility], [address] [place], [place] [address]  CBG monitoring, ED     Status: Abnormal   Collection Time: 12/26/21 11:56 AM  Result Value Ref Range   Glucose-Capillary 146 (H) 70 - 99 mg/dL    Comment: Glucose reference range applies only to samples taken after fasting for at least 8 hours.  Glucose, capillary     Status: Abnormal   Collection Time: 12/26/21  3:27 PM  Result Value Ref Range   Glucose-Capillary 217 (H) 70 - 99 mg/dL    Comment: Glucose reference range applies only to samples taken after fasting for at least 8 hours.  Glucose, capillary     Status: Abnormal   Collection Time: 12/26/21  7:43 PM  Result Value Ref Range   Glucose-Capillary 188 (H) 70 - 99 mg/dL    Comment: Glucose reference range applies only to samples taken after fasting for at least 8 hours.  CBC     Status: Abnormal   Collection Time: 12/27/21  1:22 AM  Result Value Ref Range   WBC 10.7 (H) 4.0 - 10.5 K/uL    Comment: WHITE COUNT CONFIRMED ON SMEAR   RBC 4.95 4.22 - 5.81 MIL/uL   Hemoglobin 15.6 13.0 - 17.0 g/dL   HCT 96.245.8 95.239.0 - 84.152.0 %   MCV 92.5 80.0 - 100.0 fL   MCH 31.5 26.0 - 34.0 pg   MCHC 34.1 30.0 - 36.0 g/dL   RDW 32.413.5 40.111.5 - 02.715.5 %   Platelets PLATELET CLUMPS NOTED ON SMEAR, UNABLE TO ESTIMATE 150 - 400  K/uL   nRBC 0.0 0.0 - 0.2 %    Comment: Performed at [facility], [address], [place], [place] [address]  Basic metabolic panel     Status: Abnormal   Collection Time: 12/27/21  1:22 AM  Result Value Ref Range   Sodium 138 135 - 145 mmol/L   Potassium 4.1 3.5 - 5.1 mmol/L   Chloride 101 98 - 111 mmol/L   CO2 28 22 - 32 mmol/L   Glucose, Bld 219 (H) 70 - 99 mg/dL    Comment: Glucose reference range applies only to  samples taken after fasting for at least 8 hours.   BUN 6 (L) 8 - 23 mg/dL   Creatinine, Ser 6.04 0.61 - 1.24 mg/dL   Calcium 8.6 (L) 8.9 - 10.3 mg/dL   GFR, Estimated >54 >09 mL/min    Comment: (NOTE) Calculated using the CKD-EPI Creatinine Equation (2021)    Anion gap 9 5 - 15    Comment: Performed at [facility], [address], [place], [place] [address]  Glucose, capillary     Status: Abnormal   Collection Time: 12/27/21  7:55 AM  Result Value Ref Range   Glucose-Capillary 226 (H) 70 - 99 mg/dL    Comment: Glucose reference range applies only to samples taken after fasting for at least 8 hours.  Glucose, capillary     Status: Abnormal   Collection Time: 12/27/21  9:27 AM  Result Value Ref Range   Glucose-Capillary 192 (H) 70 - 99 mg/dL    Comment: Glucose reference range applies only to samples taken after fasting for at least 8 hours.  Aerobic/Anaerobic Culture w Gram Stain (surgical/deep wound)     Status: None   Collection Time: 12/27/21 12:02 PM   Specimen: Abscess  Result Value Ref Range   Specimen Description ABSCESS    Special Requests ABSCESS LEFT KNEE SPEC A    Gram Stain      RARE WBC PRESENT, PREDOMINANTLY PMN RARE GRAM POSITIVE COCCI IN PAIRS    Culture      MODERATE METHICILLIN RESISTANT STAPHYLOCOCCUS AUREUS NO ANAEROBES ISOLATED Performed at [facility], [address], [place], [place] [address]    Report Status 01/01/2022 FINAL    Organism ID, Bacteria METHICILLIN RESISTANT STAPHYLOCOCCUS AUREUS       Susceptibility   Methicillin resistant staphylococcus aureus - MIC*    CIPROFLOXACIN >=8 RESISTANT Resistant     ERYTHROMYCIN >=8 RESISTANT Resistant     GENTAMICIN <=0.5 SENSITIVE Sensitive     OXACILLIN >=4 RESISTANT Resistant     TETRACYCLINE <=1 SENSITIVE Sensitive     VANCOMYCIN 1 SENSITIVE Sensitive     TRIMETH/SULFA 160 RESISTANT Resistant     CLINDAMYCIN <=0.25 SENSITIVE Sensitive     RIFAMPIN <=0.5 SENSITIVE Sensitive      Inducible Clindamycin NEGATIVE Sensitive     * MODERATE METHICILLIN RESISTANT STAPHYLOCOCCUS AUREUS  Glucose, capillary     Status: Abnormal   Collection Time: 12/27/21 12:23 PM  Result Value Ref Range   Glucose-Capillary 141 (H) 70 - 99 mg/dL    Comment: Glucose reference range applies only to samples taken after fasting for at least 8 hours.  Glucose, capillary     Status: Abnormal   Collection Time: 12/27/21  4:35 PM  Result Value Ref Range   Glucose-Capillary 248 (H) 70 - 99 mg/dL    Comment: Glucose reference range applies only to samples taken after fasting for at least 8 hours.  Glucose, capillary     Status: Abnormal   Collection Time: 12/27/21  7:56 PM  Result Value Ref Range   Glucose-Capillary 189 (H) 70 - 99 mg/dL    Comment: Glucose reference range applies only to samples taken after fasting for at least 8 hours.  CBC with Differential/Platelet     Status: Abnormal   Collection Time: 12/28/21  2:44 AM  Result Value Ref Range   WBC 9.8 4.0 - 10.5 K/uL   RBC 4.28 4.22 - 5.81 MIL/uL   Hemoglobin 13.7 13.0 - 17.0 g/dL   HCT 59.1 63.8 - 46.6 %   MCV 94.4 80.0 - 100.0 fL   MCH 32.0 26.0 - 34.0 pg   MCHC 33.9 30.0 - 36.0 g/dL   RDW 59.9 35.7 - 01.7 %   Platelets 131 (L) 150 - 400 K/uL   nRBC 0.0 0.0 - 0.2 %   Neutrophils Relative % 60 %   Neutro Abs 5.9 1.7 - 7.7 K/uL   Lymphocytes Relative 25 %   Lymphs Abs 2.5 0.7 - 4.0 K/uL   Monocytes Relative 11 %   Monocytes Absolute 1.0 0.1 - 1.0 K/uL   Eosinophils Relative 3 %   Eosinophils Absolute 0.3 0.0 - 0.5 K/uL   Basophils Relative 1 %   Basophils Absolute 0.1 0.0 - 0.1 K/uL   Immature Granulocytes 0 %   Abs Immature Granulocytes 0.03 0.00 - 0.07 K/uL    Comment: Performed at [facility], [address], [place], [place] [address]  Comprehensive metabolic panel     Status: Abnormal   Collection Time: 12/28/21  2:44 AM  Result Value Ref Range   Sodium 135 135 - 145 mmol/L   Potassium 3.9 3.5 - 5.1 mmol/L    Chloride 101 98 - 111 mmol/L   CO2 26 22 - 32 mmol/L   Glucose, Bld 186 (H) 70 - 99 mg/dL    Comment: Glucose reference range applies only to samples taken after fasting for at least 8 hours.   BUN 11 8 - 23 mg/dL   Creatinine, Ser 3.00 0.61 - 1.24 mg/dL   Calcium 8.2 (L) 8.9 - 10.3 mg/dL   Total Protein 5.7 (L) 6.5 - 8.1 g/dL   Albumin 2.7 (L) 3.5 - 5.0 g/dL   AST 10 (L) 15 - 41 U/L   ALT 13 0 - 44 U/L   Alkaline Phosphatase 85 38 - 126 U/L   Total Bilirubin 0.3 0.3 - 1.2 mg/dL   GFR, Estimated >92 >33 mL/min    Comment: (NOTE) Calculated using the CKD-EPI Creatinine Equation (2021)    Anion gap 8 5 - 15    Comment: Performed at [facility], [address], [place], [place] [address]  Phosphorus     Status: None   Collection Time: 12/28/21  2:44 AM  Result Value Ref Range   Phosphorus 3.9 2.5 - 4.6 mg/dL    Comment: Performed at [facility], [address] [place], [place] [address]  Magnesium     Status: None   Collection Time: 12/28/21  2:44 AM  Result Value Ref Range   Magnesium 1.7 1.7 - 2.4 mg/dL    Comment: Performed at [facility], [address], [place], [place] [address]  Glucose, capillary     Status: Abnormal   Collection Time: 12/28/21  7:48 AM  Result Value Ref Range   Glucose-Capillary 169 (H) 70 - 99 mg/dL    Comment: Glucose  reference range applies only to samples taken after fasting for at least 8 hours.  Glucose, capillary     Status: Abnormal   Collection Time: 12/28/21 11:29 AM  Result Value Ref Range   Glucose-Capillary 139 (H) 70 - 99 mg/dL    Comment: Glucose reference range applies only to samples taken after fasting for at least 8 hours.  Glucose, capillary     Status: Abnormal   Collection Time: 12/28/21  4:21 PM  Result Value Ref Range   Glucose-Capillary 135 (H) 70 - 99 mg/dL    Comment: Glucose reference range applies only to samples taken after fasting for at least 8 hours.  Glucose, capillary     Status: Abnormal    Collection Time: 12/28/21  8:32 PM  Result Value Ref Range   Glucose-Capillary 211 (H) 70 - 99 mg/dL    Comment: Glucose reference range applies only to samples taken after fasting for at least 8 hours.  CBC with Differential/Platelet     Status: None   Collection Time: 12/29/21  1:26 AM  Result Value Ref Range   WBC 8.9 4.0 - 10.5 K/uL   RBC 4.24 4.22 - 5.81 MIL/uL   Hemoglobin 13.3 13.0 - 17.0 g/dL   HCT 16.1 09.6 - 04.5 %   MCV 94.6 80.0 - 100.0 fL   MCH 31.4 26.0 - 34.0 pg   MCHC 33.2 30.0 - 36.0 g/dL   RDW 40.9 81.1 - 91.4 %   Platelets PLATELET CLUMPS NOTED ON SMEAR, UNABLE TO ESTIMATE 150 - 400 K/uL   nRBC 0.2 0.0 - 0.2 %   Neutrophils Relative % 52 %   Neutro Abs 4.7 1.7 - 7.7 K/uL   Lymphocytes Relative 32 %   Lymphs Abs 2.8 0.7 - 4.0 K/uL   Monocytes Relative 10 %   Monocytes Absolute 0.9 0.1 - 1.0 K/uL   Eosinophils Relative 5 %   Eosinophils Absolute 0.4 0.0 - 0.5 K/uL   Basophils Relative 1 %   Basophils Absolute 0.1 0.0 - 0.1 K/uL   Immature Granulocytes 0 %   Abs Immature Granulocytes 0.02 0.00 - 0.07 K/uL    Comment: Performed at [facility], [address], [place], [place] [address]  Comprehensive metabolic panel     Status: Abnormal   Collection Time: 12/29/21  1:26 AM  Result Value Ref Range   Sodium 137 135 - 145 mmol/L   Potassium 4.1 3.5 - 5.1 mmol/L   Chloride 96 (L) 98 - 111 mmol/L   CO2 34 (H) 22 - 32 mmol/L   Glucose, Bld 193 (H) 70 - 99 mg/dL    Comment: Glucose reference range applies only to samples taken after fasting for at least 8 hours.   BUN 20 8 - 23 mg/dL   Creatinine, Ser 6.21 0.61 - 1.24 mg/dL   Calcium 8.7 (L) 8.9 - 10.3 mg/dL   Total Protein 6.1 (L) 6.5 - 8.1 g/dL   Albumin 2.8 (L) 3.5 - 5.0 g/dL   AST 9 (L) 15 - 41 U/L   ALT 12 0 - 44 U/L   Alkaline Phosphatase 77 38 - 126 U/L   Total Bilirubin 0.3 0.3 - 1.2 mg/dL   GFR, Estimated >30 >86 mL/min    Comment: (NOTE) Calculated using the CKD-EPI Creatinine Equation  (2021)    Anion gap 7 5 - 15    Comment: Performed at [facility], [address] [place], [place] [address]  Phosphorus  Status: None   Collection Time: 12/29/21  1:26 AM  Result Value Ref Range   Phosphorus 4.2 2.5 - 4.6 mg/dL    Comment: Performed at [facility], [address] [place], [place] [address]  Magnesium     Status: None   Collection Time: 12/29/21  1:26 AM  Result Value Ref Range   Magnesium 2.0 1.7 - 2.4 mg/dL    Comment: Performed at [facility], [address] [place], [place] [address]  Glucose, capillary     Status: Abnormal   Collection Time: 12/29/21  7:35 AM  Result Value Ref Range   Glucose-Capillary 201 (H) 70 - 99 mg/dL    Comment: Glucose reference range applies only to samples taken after fasting for at least 8 hours.  Sedimentation rate     Status: Abnormal   Collection Time: 12/29/21 11:30 AM  Result Value Ref Range   Sed Rate 33 (H) 0 - 16 mm/hr    Comment: Performed at [facility], [address] [place], [place] [address]  C-reactive protein     Status: Abnormal   Collection Time: 12/29/21 11:30 AM  Result Value Ref Range   CRP 4.5 (H) <1.0 mg/dL    Comment: Performed at [facility], [address] [place], [place] [address]  Glucose, capillary     Status: Abnormal   Collection Time: 12/29/21 11:33 AM  Result Value Ref Range   Glucose-Capillary 199 (H) 70 - 99 mg/dL    Comment: Glucose reference range applies only to samples taken after fasting for at least 8 hours.    Assessment/Plan: No problem-specific Assessment & Plan notes found for this encounter.

## 2022-01-09 NOTE — Progress Notes (Signed)
Office Visit Note   Patient: Cody Pena           Date of Birth: 12-Mar-1961           MRN: [id number] Visit Date: 01/04/2022              Requested by: No referring provider defined for this encounter. PCP: Pcp, No  Chief Complaint  Patient presents with   Left Leg - Routine Post Op    12/27/21 Left leg debridement of ulcers      HPI: Patient is a 61 year old gentleman who presents in follow-up for debridement ulceration medial aspect of the left knee.  Patient is status post a right transtibial amputation with Kerecis tissue graft.  Assessment & Plan: Visit Diagnoses:  1. S/P below knee amputation, right (HCC)     Plan: Start Dial soap cleansing 4 x 4 Ace wrap to the left leg.  Recommend resuming his doxycycline.  We will need to repeat 2 view radiographs of the right tibia and fibula in 3 months.  Follow-Up Instructions: Return in about 1 week (around 01/11/2022).   Ortho Exam  Patient is alert, oriented, no adenopathy, well-dressed, normal affect, normal respiratory effort. Examination patient has good pulses on the left leg.  He has the [name] dressing removed the wound VAC was full.  Patient is still smoking.  Radiograph shows some maceration to the tissue graft with healthy viable margins some mild redness along the superior margin.  Patient has a well consolidated right transtibial amputation.  Imaging: No results found.   Labs: Lab Results  Component Value Date   HGBA1C 9.2 (H) 09/27/2021   HGBA1C 12.5 (H) 07/23/2021   HGBA1C 7.2 (H) 11/10/2017   ESRSEDRATE 33 (H) 12/29/2021   ESRSEDRATE 22 (H) 12/26/2021   ESRSEDRATE 60 (H) 11/10/2017   CRP 4.5 (H) 12/29/2021   CRP 4.4 (H) 12/26/2021   CRP 3.2 (H) 11/10/2017   REPTSTATUS 01/01/2022 FINAL 12/27/2021   GRAMSTAIN  12/27/2021    RARE WBC PRESENT, PREDOMINANTLY PMN RARE GRAM POSITIVE COCCI IN PAIRS    CULT  12/27/2021    MODERATE METHICILLIN RESISTANT STAPHYLOCOCCUS AUREUS NO ANAEROBES  ISOLATED Performed at [facility], [address] [place], [place] [address]    LABORGA METHICILLIN RESISTANT STAPHYLOCOCCUS AUREUS 12/27/2021     Lab Results  Component Value Date   ALBUMIN 2.8 (L) 12/29/2021   ALBUMIN 2.7 (L) 12/28/2021   ALBUMIN 3.2 (L) 12/25/2021   PREALBUMIN 17.8 (L) 12/26/2021   PREALBUMIN 11.9 (L) 09/27/2021   PREALBUMIN 12.9 (L) 09/18/2021    Lab Results  Component Value Date   MG 2.0 12/29/2021   MG 1.7 12/28/2021   MG 1.8 09/27/2021   Lab Results  Component Value Date   VD25OH 33.23 09/27/2021    Lab Results  Component Value Date   PREALBUMIN 17.8 (L) 12/26/2021   PREALBUMIN 11.9 (L) 09/27/2021   PREALBUMIN 12.9 (L) 09/18/2021      Latest Ref Rng & Units 12/29/2021    1:26 AM 12/28/2021    2:44 AM 12/27/2021    1:22 AM  CBC EXTENDED  WBC 4.0 - 10.5 K/uL 8.9   9.8   10.7    RBC 4.22 - 5.81 MIL/uL 4.24   4.28   4.95    Hemoglobin 13.0 - 17.0 g/dL 00.7   62.2   63.3    HCT 39.0 - 52.0 % 40.1   40.4   45.8    Platelets 150 -  400 K/uL PLATELET CLUMPS NOTED ON SMEAR, UNABLE TO ESTIMATE   131   PLATELET CLUMPS NOTED ON SMEAR, UNABLE TO ESTIMATE    NEUT# 1.7 - 7.7 K/uL 4.7   5.9     Lymph# 0.7 - 4.0 K/uL 2.8   2.5        There is no height or weight on file to calculate BMI.  Orders:  Orders Placed This Encounter  Procedures   XR Tibia/Fibula Right   No orders of the defined types were placed in this encounter.    Procedures: No procedures performed  Clinical Data: No additional findings.  ROS:  All other systems negative, except as noted in the HPI. Review of Systems  Objective: Vital Signs: There were no vitals taken for this visit.  Specialty Comments:  No specialty comments available.  PMFS History: Patient Active Problem List   Diagnosis Date Noted   Malnutrition of moderate degree 12/28/2021   Abscess of left lower leg    Wound infection 12/26/2021   S/P BKA (below knee amputation), right (HCC) 09/30/2021    Below-knee amputation of right lower extremity (HCC) 09/27/2021   Dehiscence of amputation stump of right lower extremity (HCC)    Wound infection after surgery 09/16/2021   Cellulitis of right lower extremity    Peripheral vascular disease (HCC)    History of transmetatarsal amputation of right foot (HCC) 08/25/2021   Osteomyelitis (HCC) 07/24/2021   HTN (hypertension) 07/23/2021   Hyperlipidemia 07/23/2021   Diabetic foot ulcers (HCC) 07/23/2021   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) 01/14/2018   Ulcer of left foot, limited to breakdown of skin (HCC) 01/14/2018   Foot osteomyelitis, left (HCC)    Septic arthritis of foot (HCC) 11/11/2017   Osteomyelitis of ankle or foot, acute, left (HCC) 11/11/2017   Type 2 DM with diabetic peripheral angiopathy w/o gangrene (HCC) 11/11/2017   Cigarette smoker 11/11/2017   Diabetic peripheral neuropathy (HCC) 11/11/2017   Diabetic foot infection (HCC) 11/10/2017   Post op infection    Delirium 12/07/2014   Cervical vertebral fusion 12/07/2014   Hallucination 12/07/2014   Poorly controlled type 2 diabetes mellitus (HCC) 12/07/2014   Mood disorder (HCC) 12/07/2014   Pyrexia    Neck swelling    Past Medical History:  Diagnosis Date   COVID    has had Covid 3 times, not sick with it   Depression    Diabetes mellitus without complication (HCC)    pt states he has now been told that he is a type 1 diabetic.   Hypertension    Peripheral vascular disease (HCC)     History reviewed. No pertinent family history.  Past Surgical History:  Procedure Laterality Date   ABDOMINAL AORTOGRAM W/LOWER EXTREMITY N/A 08/11/2021   Procedure: ABDOMINAL AORTOGRAM W/LOWER EXTREMITY;  Surgeon: Leonie Douglas, MD;  [facility];  Service: Cardiovascular;  Laterality: N/A;   AMPUTATION Left 11/15/2017   Procedure: LEFT FOOT 2ND RAY AMPUTATION;  Surgeon: Nadara Mustard, MD;  Location: [facility] OR;  Service: Orthopedics;   Laterality: Left;   AMPUTATION Right 08/25/2021   Procedure: RIGHT TRANSMETATARSAL AMPUTATION;  Surgeon: Nadara Mustard, MD;  Location: [facility] OR;  Service: Orthopedics;  Laterality: Right;   AMPUTATION Right 09/27/2021   Procedure: BELOW KNEE AMPUTATION;  Surgeon: Nadara Mustard, MD;  Location: [facility] OR;  Service: Orthopedics;  Laterality: Right;   APPLICATION OF WOUND VAC  12/27/2021   Procedure: APPLICATION  OF WOUND VAC;  Surgeon: Nadara Mustarduda, Thurza Kwiecinski V, MD;  [facility];  Service: Orthopedics;;   CERVICAL FUSION     I & D EXTREMITY Left 12/27/2021   Procedure: LEFT LEG DEBRIDEMENT ULCERS;  Surgeon: Nadara Mustarduda, Hridaan Bouse V, MD;  [facility];  Service: Orthopedics;  Laterality: Left;   IR ANGIOGRAM EXTREMITY LEFT  11/14/2017   IR ANGIOGRAM SELECTIVE EACH ADDITIONAL VESSEL  11/14/2017   IR ANGIOGRAM SELECTIVE EACH ADDITIONAL VESSEL  11/14/2017   IR TIB-PERO ART PTA MOD SED  11/14/2017   IR TIB-PERO ART UNI PTA EA ADD VESSEL MOD SED  11/14/2017   IR US GUIDE VASC ACCESS LEFT  11/14/2017   PERIPHERAL VASCULAR INTERVENTION Right 08/11/2021   Procedure: PERIPHERAL VASCULAR INTERVENTION;  Surgeon: Leonie DouglasHawken, Thomas N, MD;  Location: [facility];  Service: Cardiovascular;  Laterality: Right;  rt sfa stent   Social History   Occupational History   Not on file  Tobacco Use   Smoking status: Every Day    Packs/day: 0.50    Years: 47.00    Pack years: 23.50    Types: Cigarettes    Last attempt to quit: 08/11/2021    Years since quitting: 0.4   Smokeless tobacco: Never  Vaping Use   Vaping Use: Some days  Substance and Sexual Activity   Alcohol use: No   Drug use: No   Sexual activity: Not on file

## 2022-01-10 ENCOUNTER — Other Ambulatory Visit: Payer: Self-pay | Admitting: Orthopedic Surgery

## 2022-01-11 ENCOUNTER — Inpatient Hospital Stay: Payer: 59 | Admitting: Infectious Diseases

## 2022-01-11 ENCOUNTER — Telehealth: Payer: Self-pay | Admitting: Orthopedic Surgery

## 2022-01-11 ENCOUNTER — Encounter: Payer: 59 | Admitting: Orthopedic Surgery

## 2022-01-11 NOTE — Telephone Encounter (Signed)
Pt called requesting to reschedule for post op. Pt states his car wont start. No openings for Duda until 6/12 and none for PA Erin util 6/20. Please call pt with new appt. Pt phone number is [phone]

## 2022-01-12 NOTE — Telephone Encounter (Signed)
[email or web address]

## 2022-01-15 NOTE — Telephone Encounter (Signed)
SW pt, put him on Erin's schedule 01/17/22 at 9:30 am.

## 2022-01-17 ENCOUNTER — Ambulatory Visit (INDEPENDENT_AMBULATORY_CARE_PROVIDER_SITE_OTHER): Payer: 59 | Admitting: Family

## 2022-01-17 ENCOUNTER — Ambulatory Visit: Payer: Self-pay

## 2022-01-17 DIAGNOSIS — Z89511 Acquired absence of right leg below knee: Secondary | ICD-10-CM

## 2022-01-17 NOTE — Progress Notes (Signed)
Post-Op Visit Note   Patient: Cody Pena           Date of Birth: Nov 27, 1960           MRN: [id number] Visit Date: 01/17/2022 PCP: Pcp, No  Chief Complaint:  Chief Complaint  Patient presents with   Left Leg - Routine Post Op    12/27/21 debridement of ulcers   Right Leg - Follow-up    09/27/21 right BKA    HPI:  HPI The patient is a 61 year old gentleman who is seen today for 2 separate issues.  He is status post right below-knee amputation February 15 this is well-healed  He is also seen in follow-up status post debridement of an abscess left knee with [facility] placement May 17.  He has been doing dry dressing changes  He has completed a course of doxycycline. Ortho Exam On examination of the left knee his incision has gaped since last visit.  There is proximal tunneling.  Granulation in the wound bed about 10% fibrinous exudative tissue there is scant serous drainage.  There is no surrounding erythema no purulence no odor  Visit Diagnoses:  1. S/P below knee amputation, right (HCC)     Plan: We will continue daily Dial soap cleansing.  Dry dressing changes.  Discussed possibility of further irrigation debridement with graft placement and wound closure with the patient.  At this time he will follow-up with Dr. Lajoyce Corners in the office in 10 to 14 days.  Follow-Up Instructions: No follow-ups on file.   Imaging: No results found.   Orders:  Orders Placed This Encounter  Procedures   XR Tibia/Fibula Right   No orders of the defined types were placed in this encounter.    PMFS History: Patient Active Problem List   Diagnosis Date Noted   Malnutrition of moderate degree 12/28/2021   Abscess of left lower leg    Wound infection 12/26/2021   S/P BKA (below knee amputation), right (HCC) 09/30/2021   Below-knee amputation of right lower extremity (HCC) 09/27/2021   Dehiscence of amputation stump of right lower extremity (HCC)    Wound infection after surgery  09/16/2021   Cellulitis of right lower extremity    Peripheral vascular disease (HCC)    History of transmetatarsal amputation of right foot (HCC) 08/25/2021   Osteomyelitis (HCC) 07/24/2021   HTN (hypertension) 07/23/2021   Hyperlipidemia 07/23/2021   Diabetic foot ulcers (HCC) 07/23/2021   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) 01/14/2018   Ulcer of left foot, limited to breakdown of skin (HCC) 01/14/2018   Foot osteomyelitis, left (HCC)    Septic arthritis of foot (HCC) 11/11/2017   Osteomyelitis of ankle or foot, acute, left (HCC) 11/11/2017   Type 2 DM with diabetic peripheral angiopathy w/o gangrene (HCC) 11/11/2017   Cigarette smoker 11/11/2017   Diabetic peripheral neuropathy (HCC) 11/11/2017   Diabetic foot infection (HCC) 11/10/2017   Post op infection    Delirium 12/07/2014   Cervical vertebral fusion 12/07/2014   Hallucination 12/07/2014   Poorly controlled type 2 diabetes mellitus (HCC) 12/07/2014   Mood disorder (HCC) 12/07/2014   Pyrexia    Neck swelling    Past Medical History:  Diagnosis Date   COVID    has had Covid 3 times, not sick with it   Depression    Diabetes mellitus without complication (HCC)    pt states he has now been told that he is a type 1 diabetic.  Hypertension    Peripheral vascular disease (HCC)     No family history on file.  Past Surgical History:  Procedure Laterality Date   ABDOMINAL AORTOGRAM W/LOWER EXTREMITY N/A 08/11/2021   Procedure: ABDOMINAL AORTOGRAM W/LOWER EXTREMITY;  Surgeon: Leonie Douglas, MD;  [facility];  Service: Cardiovascular;  Laterality: N/A;   AMPUTATION Left 11/15/2017   Procedure: LEFT FOOT 2ND RAY AMPUTATION;  Surgeon: Nadara Mustard, MD;  Location: [facility] OR;  Service: Orthopedics;  Laterality: Left;   AMPUTATION Right 08/25/2021   Procedure: RIGHT TRANSMETATARSAL AMPUTATION;  Surgeon: Nadara Mustard, MD;  Location: [facility] OR;  Service: Orthopedics;  Laterality: Right;    AMPUTATION Right 09/27/2021   Procedure: BELOW KNEE AMPUTATION;  Surgeon: Nadara Mustard, MD;  Location: [facility] OR;  Service: Orthopedics;  Laterality: Right;   APPLICATION OF WOUND VAC  12/27/2021   Procedure: APPLICATION OF WOUND VAC;  Surgeon: Nadara Mustard, MD;  Location: [facility] OR;  Service: Orthopedics;;   CERVICAL FUSION     I & D EXTREMITY Left 12/27/2021   Procedure: LEFT LEG DEBRIDEMENT ULCERS;  Surgeon: Nadara Mustard, MD;  Location: [facility] OR;  Service: Orthopedics;  Laterality: Left;   IR ANGIOGRAM EXTREMITY LEFT  11/14/2017   IR ANGIOGRAM SELECTIVE EACH ADDITIONAL VESSEL  11/14/2017   IR ANGIOGRAM SELECTIVE EACH ADDITIONAL VESSEL  11/14/2017   IR TIB-PERO ART PTA MOD SED  11/14/2017   IR TIB-PERO ART UNI PTA EA ADD VESSEL MOD SED  11/14/2017   IR US GUIDE VASC ACCESS LEFT  11/14/2017   PERIPHERAL VASCULAR INTERVENTION Right 08/11/2021   Procedure: PERIPHERAL VASCULAR INTERVENTION;  Surgeon: Leonie Douglas, MD;  [facility];  Service: Cardiovascular;  Laterality: Right;  rt sfa stent   Social History   Occupational History   Not on file  Tobacco Use   Smoking status: Every Day    Packs/day: 0.50    Years: 47.00    Pack years: 23.50    Types: Cigarettes    Last attempt to quit: 08/11/2021    Years since quitting: 0.4   Smokeless tobacco: Never  Vaping Use   Vaping Use: Some days  Substance and Sexual Activity   Alcohol use: No   Drug use: No   Sexual activity: Not on file

## 2022-01-19 ENCOUNTER — Other Ambulatory Visit: Payer: Self-pay | Admitting: Orthopedic Surgery

## 2022-01-19 ENCOUNTER — Other Ambulatory Visit: Payer: Self-pay | Admitting: Family

## 2022-01-22 ENCOUNTER — Telehealth: Payer: Self-pay | Admitting: Orthopedic Surgery

## 2022-01-22 NOTE — Telephone Encounter (Signed)
Per Dr. Sharol Given, needs to come back into office for further eval, too soon for refill.

## 2022-01-22 NOTE — Telephone Encounter (Signed)
Patient called. He would like a refill on oxycodone. His call back nu mber is [phone]

## 2022-01-22 NOTE — Telephone Encounter (Signed)
Pt informed and on the schedule to see Dr.Duda tomorrow afternoon for f/u

## 2022-01-23 ENCOUNTER — Ambulatory Visit (INDEPENDENT_AMBULATORY_CARE_PROVIDER_SITE_OTHER): Payer: 59 | Admitting: Orthopedic Surgery

## 2022-01-23 ENCOUNTER — Other Ambulatory Visit: Payer: Self-pay | Admitting: *Deleted

## 2022-01-23 DIAGNOSIS — L97921 Non-pressure chronic ulcer of unspecified part of left lower leg limited to breakdown of skin: Secondary | ICD-10-CM

## 2022-01-23 DIAGNOSIS — Z89511 Acquired absence of right leg below knee: Secondary | ICD-10-CM

## 2022-01-23 DIAGNOSIS — I739 Peripheral vascular disease, unspecified: Secondary | ICD-10-CM

## 2022-01-25 ENCOUNTER — Encounter (HOSPITAL_COMMUNITY): Payer: Self-pay | Admitting: Orthopedic Surgery

## 2022-01-25 ENCOUNTER — Encounter (HOSPITAL_COMMUNITY): Payer: Self-pay | Admitting: Anesthesiology

## 2022-01-25 NOTE — Progress Notes (Signed)
PCP - none Cardiologist - none  Chest x-ray - n/a EKG - 09/16/21 Stress Test - n/a ECHO - n/a Cardiac Cath - n/a  ICD Pacemaker/Loop - n/a  Sleep Study -  n/a CPAP - none  Do not take metformin on the morning of surgery.  THE NIGHT BEFORE SURGERY, take 12 Units Lantus Insulin      THE MORNING OF SURGERY, do not take Humalog Insulin unless your CBG is greater than 220 mg/dL, you may take  of your sliding scale (correction) dose of insulin.  If your blood sugar is less than 70 mg/dL, you will need to treat for low blood sugar: Treat a low blood sugar (less than 70 mg/dL) with  cup of clear juice (cranberry or apple), 4 glucose tablets, OR glucose gel. Recheck blood sugar in 15 minutes after treatment (to make sure it is greater than 70 mg/dL). If your blood sugar is not greater than 70 mg/dL on recheck, call [phone] for further instructions.  Aspirin/Blood Thinner Instructions: Follow your surgeon's instructions on when to stop Aspirin and Plavix prior to surgery,  If no instructions were given by your surgeon then you will need to call the office for those instructions. Patient agreed to call MD for these instructions when we finish our call.  Patient states he has Dr Audrie Lia office number.  ERAS: Clear liquids til 11:30 AM DOS  Anesthesia review: Yes  STOP now taking any Aspirin (unless otherwise instructed by your surgeon), Aleve, Naproxen, Ibuprofen, Motrin, Advil, Goody's, BC's, all herbal medications, fish oil, and all vitamins.   Coronavirus Screening Do you have any of the following symptoms:  Cough yes/no: No Fever (>100.29F)  yes/no: No Runny nose yes/no: No Sore throat yes/no: No Difficulty breathing/shortness of breath  yes/no: No  Have you traveled in the last 14 days and where? yes/no: No  Patient verbalized understanding of instructions that were given via phone.

## 2022-01-26 ENCOUNTER — Ambulatory Visit (HOSPITAL_COMMUNITY): Admission: RE | Admit: 2022-01-26 | Payer: 59 | Source: Home / Self Care | Admitting: Orthopedic Surgery

## 2022-01-26 ENCOUNTER — Encounter: Payer: Self-pay | Admitting: Orthopedic Surgery

## 2022-01-26 HISTORY — DX: Hyperlipidemia, unspecified: E78.5

## 2022-01-26 HISTORY — DX: Dyspnea, unspecified: R06.00

## 2022-01-26 SURGERY — IRRIGATION AND DEBRIDEMENT EXTREMITY
Anesthesia: Choice | Laterality: Left

## 2022-01-26 NOTE — H&P (Signed)
Cody Pena SURGEON is an 61 y.o. male.   Chief Complaint: Left knee chronic ulceration. HPI: The patient is a 61 year old gentleman who is seen today for 2 separate issues.  He is status post right below-knee amputation February 15 this is well-healed   He is also seen in follow-up status post debridement of an abscess left knee with [facility] placement May 17.  He has been doing dry dressing changes   He has completed a course of doxycycline.  Past Medical History:  Diagnosis Date   COVID    has had Covid 3 times, not sick with it   Depression    Diabetes mellitus without complication (HCC)    pt states he has now been told that he is a type 1 diabetic.   Dyspnea    albuterol inhaler   HLD (hyperlipidemia)    Hypertension    Peripheral vascular disease (HCC)     Past Surgical History:  Procedure Laterality Date   ABDOMINAL AORTOGRAM W/LOWER EXTREMITY N/A 08/11/2021   Procedure: ABDOMINAL AORTOGRAM W/LOWER EXTREMITY;  Surgeon: Leonie Douglas, MD;  [facility];  Service: Cardiovascular;  Laterality: N/A;   AMPUTATION Left 11/15/2017   Procedure: LEFT FOOT 2ND RAY AMPUTATION;  Surgeon: Nadara Mustard, MD;  Location: [facility] OR;  Service: Orthopedics;  Laterality: Left;   AMPUTATION Right 08/25/2021   Procedure: RIGHT TRANSMETATARSAL AMPUTATION;  Surgeon: Nadara Mustard, MD;  Location: [facility] OR;  Service: Orthopedics;  Laterality: Right;   AMPUTATION Right 09/27/2021   Procedure: BELOW KNEE AMPUTATION;  Surgeon: Nadara Mustard, MD;  Location: [facility] OR;  Service: Orthopedics;  Laterality: Right;   APPLICATION OF WOUND VAC  12/27/2021   Procedure: APPLICATION OF WOUND VAC;  Surgeon: Nadara Mustard, MD;  Location: [facility] OR;  Service: Orthopedics;;   CERVICAL FUSION     I & D EXTREMITY Left 12/27/2021   Procedure: LEFT LEG DEBRIDEMENT ULCERS;  Surgeon: Nadara Mustard, MD;  Location: [facility] OR;  Service: Orthopedics;  Laterality: Left;   IR ANGIOGRAM EXTREMITY LEFT  11/14/2017   IR ANGIOGRAM  SELECTIVE EACH ADDITIONAL VESSEL  11/14/2017   IR ANGIOGRAM SELECTIVE EACH ADDITIONAL VESSEL  11/14/2017   IR TIB-PERO ART PTA MOD SED  11/14/2017   IR TIB-PERO ART UNI PTA EA ADD VESSEL MOD SED  11/14/2017   IR US GUIDE VASC ACCESS LEFT  11/14/2017   PERIPHERAL VASCULAR INTERVENTION Right 08/11/2021   Procedure: PERIPHERAL VASCULAR INTERVENTION;  Surgeon: Leonie Douglas, MD;  [facility];  Service: Cardiovascular;  Laterality: Right;  rt sfa stent    No family history on file. Social History:  reports that he has been smoking cigarettes. He has a 23.50 pack-year smoking history. He has never used smokeless tobacco. He reports that he does not drink alcohol and does not use drugs.  Allergies:  Allergies  Allergen Reactions   Ceftriaxone Itching, Nausea And Vomiting and Rash    Noted minutes after starting medication    No medications prior to admission.    No results found for this or any previous visit (from the past 48 hour(s)). No results found.  Review of Systems  All other systems reviewed and are negative.   Height 6\' 4"  (1.93 m), weight 101.2 kg. Physical Exam  Patient is alert oriented no adenopathy well-dressed normal affect normal respiratory effort.  The left knee incision has gaped open there is tunneling there is granulation tissue in the wound bed there is serosanguineous  drainage.  There is no purulence no odor. Assessment/Plan Assessment: Chronic medial wound left knee.  Plan: We will plan for debridement of the wound application of tissue graft application of a wound VAC.  We will need to place him in a knee immobilizer to keep tension off the wound.  Risk and benefits were discussed including risk of the wound not healing need for additional surgery.  Patient states he understands wished to proceed at this time.  Nadara Mustard, MD 01/26/2022, 6:51 AM

## 2022-01-26 NOTE — Progress Notes (Signed)
Office Visit Note   Patient: Cody Pena           Date of Birth: 07/10/1961           MRN: [id number] Visit Date: 01/23/2022              Requested by: No referring provider defined for this encounter. PCP: Pcp, No  Chief Complaint  Patient presents with   Left Leg - Routine Post Op    12/27/21 left leg debridement of ulcers   Right Leg - Follow-up    09/27/21 right BKA w/ kerecis graft      HPI: Patient is a 61 year old gentleman who is status post right transtibial amputation as well as a large wound over the medial aspect of the left knee.  Assessment & Plan: Visit Diagnoses:  1. S/P below knee amputation, right (HCC)   2. PAD (peripheral artery disease) (HCC)   3. Skin ulcer of left lower leg, limited to breakdown of skin (HCC)     Plan: With the progressive wound breakdown I have recommended proceeding with debridement and application of Kerecis tissue graft.  The risk and benefits were discussed including further wound breakdown need for additional surgery.  Patient states he understands wished to proceed on Friday.  Anticipate a knee immobilizer postoperatively.  Follow-Up Instructions: Return in about 1 week (around 01/30/2022).   Ortho Exam  Patient is alert, oriented, no adenopathy, well-dressed, normal affect, normal respiratory effort. Examination of the left knee there is wound dehiscence sutures are intact there is dermatitis around the wound edges.  Imaging: No results found.   Labs: Lab Results  Component Value Date   HGBA1C 9.2 (H) 09/27/2021   HGBA1C 12.5 (H) 07/23/2021   HGBA1C 7.2 (H) 11/10/2017   ESRSEDRATE 33 (H) 12/29/2021   ESRSEDRATE 22 (H) 12/26/2021   ESRSEDRATE 60 (H) 11/10/2017   CRP 4.5 (H) 12/29/2021   CRP 4.4 (H) 12/26/2021   CRP 3.2 (H) 11/10/2017   REPTSTATUS 01/01/2022 FINAL 12/27/2021   GRAMSTAIN  12/27/2021    RARE WBC PRESENT, PREDOMINANTLY PMN RARE GRAM POSITIVE COCCI IN PAIRS    CULT  12/27/2021    MODERATE  METHICILLIN RESISTANT STAPHYLOCOCCUS AUREUS NO ANAEROBES ISOLATED Performed at [facility], [address], [place], [place] [address]    LABORGA METHICILLIN RESISTANT STAPHYLOCOCCUS AUREUS 12/27/2021     Lab Results  Component Value Date   ALBUMIN 2.8 (L) 12/29/2021   ALBUMIN 2.7 (L) 12/28/2021   ALBUMIN 3.2 (L) 12/25/2021   PREALBUMIN 17.8 (L) 12/26/2021   PREALBUMIN 11.9 (L) 09/27/2021   PREALBUMIN 12.9 (L) 09/18/2021    Lab Results  Component Value Date   MG 2.0 12/29/2021   MG 1.7 12/28/2021   MG 1.8 09/27/2021   Lab Results  Component Value Date   VD25OH 33.23 09/27/2021    Lab Results  Component Value Date   PREALBUMIN 17.8 (L) 12/26/2021   PREALBUMIN 11.9 (L) 09/27/2021   PREALBUMIN 12.9 (L) 09/18/2021      Latest Ref Rng & Units 12/29/2021    1:26 AM 12/28/2021    2:44 AM 12/27/2021    1:22 AM  CBC EXTENDED  WBC 4.0 - 10.5 K/uL 8.9  9.8  10.7   RBC 4.22 - 5.81 MIL/uL 4.24  4.28  4.95   Hemoglobin 13.0 - 17.0 g/dL 89.3  81.0  17.5   HCT 39.0 - 52.0 % 40.1  40.4  45.8   Platelets 150 - 400 K/uL PLATELET  CLUMPS NOTED ON SMEAR, UNABLE TO ESTIMATE  131  PLATELET CLUMPS NOTED ON SMEAR, UNABLE TO ESTIMATE   NEUT# 1.7 - 7.7 K/uL 4.7  5.9    Lymph# 0.7 - 4.0 K/uL 2.8  2.5       There is no height or weight on file to calculate BMI.  Orders:  No orders of the defined types were placed in this encounter.  No orders of the defined types were placed in this encounter.    Procedures: No procedures performed  Clinical Data: No additional findings.  ROS:  All other systems negative, except as noted in the HPI. Review of Systems  Objective: Vital Signs: There were no vitals taken for this visit.  Specialty Comments:  No specialty comments available.  PMFS History: Patient Active Problem List   Diagnosis Date Noted   Malnutrition of moderate degree 12/28/2021   Abscess of left lower leg    Wound infection 12/26/2021   S/P BKA (below knee  amputation), right (HCC) 09/30/2021   Below-knee amputation of right lower extremity (HCC) 09/27/2021   Dehiscence of amputation stump of right lower extremity (HCC)    Wound infection after surgery 09/16/2021   Cellulitis of right lower extremity    Peripheral vascular disease (HCC)    History of transmetatarsal amputation of right foot (HCC) 08/25/2021   Osteomyelitis (HCC) 07/24/2021   HTN (hypertension) 07/23/2021   Hyperlipidemia 07/23/2021   Diabetic foot ulcers (HCC) 07/23/2021   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) 01/14/2018   Ulcer of left foot, limited to breakdown of skin (HCC) 01/14/2018   Foot osteomyelitis, left (HCC)    Septic arthritis of foot (HCC) 11/11/2017   Osteomyelitis of ankle or foot, acute, left (HCC) 11/11/2017   Type 2 DM with diabetic peripheral angiopathy w/o gangrene (HCC) 11/11/2017   Cigarette smoker 11/11/2017   Diabetic peripheral neuropathy (HCC) 11/11/2017   Diabetic foot infection (HCC) 11/10/2017   Post op infection    Delirium 12/07/2014   Cervical vertebral fusion 12/07/2014   Hallucination 12/07/2014   Poorly controlled type 2 diabetes mellitus (HCC) 12/07/2014   Mood disorder (HCC) 12/07/2014   Pyrexia    Neck swelling    Past Medical History:  Diagnosis Date   COVID    has had Covid 3 times, not sick with it   Depression    Diabetes mellitus without complication (HCC)    pt states he has now been told that he is a type 1 diabetic.   Dyspnea    albuterol inhaler   HLD (hyperlipidemia)    Hypertension    Peripheral vascular disease (HCC)     History reviewed. No pertinent family history.  Past Surgical History:  Procedure Laterality Date   ABDOMINAL AORTOGRAM W/LOWER EXTREMITY N/A 08/11/2021   Procedure: ABDOMINAL AORTOGRAM W/LOWER EXTREMITY;  Surgeon: Leonie Douglas, MD;  [facility];  Service: Cardiovascular;  Laterality: N/A;   AMPUTATION Left 11/15/2017   Procedure: LEFT FOOT  2ND RAY AMPUTATION;  Surgeon: Nadara Mustard, MD;  Location: [facility] OR;  Service: Orthopedics;  Laterality: Left;   AMPUTATION Right 08/25/2021   Procedure: RIGHT TRANSMETATARSAL AMPUTATION;  Surgeon: Nadara Mustard, MD;  Location: [facility] OR;  Service: Orthopedics;  Laterality: Right;   AMPUTATION Right 09/27/2021   Procedure: BELOW KNEE AMPUTATION;  Surgeon: Nadara Mustard, MD;  Location: [facility] OR;  Service: Orthopedics;  Laterality: Right;   APPLICATION OF WOUND VAC  12/27/2021  Procedure: APPLICATION OF WOUND VAC;  Surgeon: Nadara Mustard, MD;  Location: [facility] OR;  Service: Orthopedics;;   CERVICAL FUSION     I & D EXTREMITY Left 12/27/2021   Procedure: LEFT LEG DEBRIDEMENT ULCERS;  Surgeon: Nadara Mustard, MD;  Location: [facility] OR;  Service: Orthopedics;  Laterality: Left;   IR ANGIOGRAM EXTREMITY LEFT  11/14/2017   IR ANGIOGRAM SELECTIVE EACH ADDITIONAL VESSEL  11/14/2017   IR ANGIOGRAM SELECTIVE EACH ADDITIONAL VESSEL  11/14/2017   IR TIB-PERO ART PTA MOD SED  11/14/2017   IR TIB-PERO ART UNI PTA EA ADD VESSEL MOD SED  11/14/2017   IR US GUIDE VASC ACCESS LEFT  11/14/2017   PERIPHERAL VASCULAR INTERVENTION Right 08/11/2021   Procedure: PERIPHERAL VASCULAR INTERVENTION;  Surgeon: Leonie Douglas, MD;  [facility];  Service: Cardiovascular;  Laterality: Right;  rt sfa stent   Social History   Occupational History   Not on file  Tobacco Use   Smoking status: Every Day    Packs/day: 0.50    Years: 47.00    Total pack years: 23.50    Types: Cigarettes    Last attempt to quit: 08/11/2021    Years since quitting: 0.4   Smokeless tobacco: Never  Vaping Use   Vaping Use: Some days  Substance and Sexual Activity   Alcohol use: No   Drug use: No   Sexual activity: Not on file

## 2022-01-30 ENCOUNTER — Other Ambulatory Visit: Payer: Self-pay

## 2022-01-30 ENCOUNTER — Ambulatory Visit: Payer: 59

## 2022-01-30 ENCOUNTER — Encounter (HOSPITAL_COMMUNITY): Payer: 59

## 2022-01-30 ENCOUNTER — Encounter (HOSPITAL_COMMUNITY): Payer: Self-pay | Admitting: Orthopedic Surgery

## 2022-01-30 NOTE — Progress Notes (Signed)
Spoke with pt for pre-op call. Pt has been here recently for surgery and states he has no new medical history. Pt is diabetic. His last A1C was 9.2 on 09/27/21. Pt states his fasting blood sugar is usually around 200. Instructed pt to take 80% of his Lantus Insulin tonight, he will take 19 units. Instructed pt to check his blood sugar when he wakes up and every 2 hours until he leaves for the hospital. If blood sugar is >220 take 1/2 of usual correction dose of Humalog insulin. If blood sugar is 70 or below, treat with 1/2 cup of clear juice (apple or cranberry) and recheck blood sugar 15 minutes after drinking juice. If blood sugar continues to be 70 or below, call the Short Stay department and ask to speak to a nurse. Pt voiced understanding.   Shower instructions given to pt.

## 2022-01-31 ENCOUNTER — Encounter (HOSPITAL_COMMUNITY)
Admission: RE | Disposition: A | Discharge status: Home / Self Care | Payer: Self-pay | Source: Home / Self Care | Attending: Orthopedic Surgery

## 2022-01-31 ENCOUNTER — Encounter (HOSPITAL_COMMUNITY): Payer: Self-pay | Admitting: Orthopedic Surgery

## 2022-01-31 ENCOUNTER — Encounter: Payer: 59 | Admitting: Family

## 2022-01-31 ENCOUNTER — Ambulatory Visit (HOSPITAL_COMMUNITY)
Admission: RE | Admit: 2022-01-31 | Discharge: 2022-01-31 | Disposition: A | Discharge status: Home / Self Care | Payer: 59 | Attending: Orthopedic Surgery | Admitting: Orthopedic Surgery

## 2022-01-31 ENCOUNTER — Ambulatory Visit (HOSPITAL_BASED_OUTPATIENT_CLINIC_OR_DEPARTMENT_OTHER): Payer: 59 | Admitting: Anesthesiology

## 2022-01-31 ENCOUNTER — Ambulatory Visit (HOSPITAL_COMMUNITY): Payer: 59 | Admitting: Anesthesiology

## 2022-01-31 DIAGNOSIS — X58XXXA Exposure to other specified factors, initial encounter: Secondary | ICD-10-CM | POA: Insufficient documentation

## 2022-01-31 DIAGNOSIS — E1151 Type 2 diabetes mellitus with diabetic peripheral angiopathy without gangrene: Secondary | ICD-10-CM | POA: Diagnosis not present

## 2022-01-31 DIAGNOSIS — T8130XA Disruption of wound, unspecified, initial encounter: Secondary | ICD-10-CM | POA: Insufficient documentation

## 2022-01-31 DIAGNOSIS — F17210 Nicotine dependence, cigarettes, uncomplicated: Secondary | ICD-10-CM | POA: Insufficient documentation

## 2022-01-31 DIAGNOSIS — I10 Essential (primary) hypertension: Secondary | ICD-10-CM

## 2022-01-31 DIAGNOSIS — E1165 Type 2 diabetes mellitus with hyperglycemia: Secondary | ICD-10-CM | POA: Diagnosis not present

## 2022-01-31 DIAGNOSIS — Z79899 Other long term (current) drug therapy: Secondary | ICD-10-CM | POA: Insufficient documentation

## 2022-01-31 DIAGNOSIS — Z794 Long term (current) use of insulin: Secondary | ICD-10-CM | POA: Insufficient documentation

## 2022-01-31 DIAGNOSIS — L02416 Cutaneous abscess of left lower limb: Secondary | ICD-10-CM | POA: Diagnosis not present

## 2022-01-31 DIAGNOSIS — M1990 Unspecified osteoarthritis, unspecified site: Secondary | ICD-10-CM | POA: Insufficient documentation

## 2022-01-31 DIAGNOSIS — Z7984 Long term (current) use of oral hypoglycemic drugs: Secondary | ICD-10-CM | POA: Diagnosis not present

## 2022-01-31 HISTORY — DX: Unspecified osteoarthritis, unspecified site: M19.90

## 2022-01-31 LAB — CBC
HCT: 45.7 % (ref 39.0–52.0)
Hemoglobin: 15.8 g/dL (ref 13.0–17.0)
MCH: 32.1 pg (ref 26.0–34.0)
MCHC: 34.6 g/dL (ref 30.0–36.0)
MCV: 92.9 fL (ref 80.0–100.0)
Platelets: 191 10*3/uL (ref 150–400)
RBC: 4.92 MIL/uL (ref 4.22–5.81)
RDW: 13.2 % (ref 11.5–15.5)
WBC: 8.5 10*3/uL (ref 4.0–10.5)
nRBC: 0 % (ref 0.0–0.2)

## 2022-01-31 LAB — BASIC METABOLIC PANEL
Anion gap: 15 (ref 5–15)
BUN: 11 mg/dL (ref 8–23)
CO2: 23 mmol/L (ref 22–32)
Calcium: 9.1 mg/dL (ref 8.9–10.3)
Chloride: 103 mmol/L (ref 98–111)
Creatinine, Ser: 0.63 mg/dL (ref 0.61–1.24)
GFR, Estimated: >60 mL/min (ref >60)
Glucose, Bld: 151 mg/dL — ABNORMAL HIGH (ref 70–99)
Potassium: 3.7 mmol/L (ref 3.5–5.1)
Sodium: 141 mmol/L (ref 135–145)

## 2022-01-31 LAB — GLUCOSE, CAPILLARY
Glucose-Capillary: 185 mg/dL — ABNORMAL HIGH (ref 70–99)
Glucose-Capillary: 160 mg/dL — ABNORMAL HIGH (ref 70–99)

## 2022-01-31 SURGERY — IRRIGATION AND DEBRIDEMENT EXTREMITY
Anesthesia: General | Laterality: Left

## 2022-01-31 MED ORDER — CEFAZOLIN SODIUM-DEXTROSE 2-4 GM/100ML-% IV SOLN
2.0000 g | INTRAVENOUS | Status: AC
  Administered 2022-01-31: 2 g via INTRAVENOUS
  Filled 2022-01-31: qty 100

## 2022-01-31 MED ORDER — OXYCODONE HCL 5 MG PO TABS
ORAL_TABLET | ORAL | Status: AC
  Filled 2022-01-31: qty 1

## 2022-01-31 MED ORDER — FENTANYL CITRATE (PF) 100 MCG/2ML IJ SOLN
INTRAMUSCULAR | Status: DC | PRN
  Administered 2022-01-31 (×3): 50 ug via INTRAVENOUS

## 2022-01-31 MED ORDER — FENTANYL CITRATE (PF) 100 MCG/2ML IJ SOLN
INTRAMUSCULAR | Status: AC
  Administered 2022-01-31: 50 ug via INTRAVENOUS
  Filled 2022-01-31: qty 2

## 2022-01-31 MED ORDER — ORAL CARE MOUTH RINSE: 15.0000 mL | Freq: Once | OROMUCOSAL | Status: DC

## 2022-01-31 MED ORDER — LACTATED RINGERS IV SOLN: INTRAVENOUS | Status: DC

## 2022-01-31 MED ORDER — ACETAMINOPHEN 325 MG PO TABS
325.0000 mg | ORAL_TABLET | ORAL | Status: DC | PRN
  Administered 2022-01-31: 325 mg via ORAL

## 2022-01-31 MED ORDER — MEPERIDINE HCL 25 MG/ML IJ SOLN: 6.2500 mg | INTRAMUSCULAR | Status: DC | PRN

## 2022-01-31 MED ORDER — CEFAZOLIN SODIUM-DEXTROSE 2-4 GM/100ML-% IV SOLN: 2.0000 g | INTRAVENOUS | Status: DC

## 2022-01-31 MED ORDER — PROPOFOL 10 MG/ML IV BOLUS
INTRAVENOUS | Status: AC
  Filled 2022-01-31: qty 20

## 2022-01-31 MED ORDER — FENTANYL CITRATE (PF) 100 MCG/2ML IJ SOLN
25.0000 ug | INTRAMUSCULAR | Status: DC | PRN
  Administered 2022-01-31 (×2): 50 ug via INTRAVENOUS

## 2022-01-31 MED ORDER — ACETAMINOPHEN 160 MG/5ML PO SOLN: 325.0000 mg | ORAL | Status: DC | PRN

## 2022-01-31 MED ORDER — MIDAZOLAM HCL 5 MG/5ML IJ SOLN
INTRAMUSCULAR | Status: DC | PRN
  Administered 2022-01-31: 2 mg via INTRAVENOUS

## 2022-01-31 MED ORDER — LIDOCAINE 2% (20 MG/ML) 5 ML SYRINGE
INTRAMUSCULAR | Status: DC | PRN
  Administered 2022-01-31: 100 mg via INTRAVENOUS

## 2022-01-31 MED ORDER — PHENYLEPHRINE 80 MCG/ML (10ML) SYRINGE FOR IV PUSH (FOR BLOOD PRESSURE SUPPORT)
PREFILLED_SYRINGE | INTRAVENOUS | Status: DC | PRN
  Administered 2022-01-31: 80 ug via INTRAVENOUS

## 2022-01-31 MED ORDER — PROPOFOL 10 MG/ML IV BOLUS
INTRAVENOUS | Status: DC | PRN
  Administered 2022-01-31: 180 mg via INTRAVENOUS

## 2022-01-31 MED ORDER — ONDANSETRON HCL 4 MG/2ML IJ SOLN: 4.0000 mg | Freq: Once | INTRAMUSCULAR | Status: DC | PRN

## 2022-01-31 MED ORDER — OXYCODONE HCL 5 MG/5ML PO SOLN: 5.0000 mg | Freq: Once | ORAL | Status: AC | PRN

## 2022-01-31 MED ORDER — FENTANYL CITRATE (PF) 100 MCG/2ML IJ SOLN
INTRAMUSCULAR | Status: AC
  Filled 2022-01-31: qty 2

## 2022-01-31 MED ORDER — FENTANYL CITRATE (PF) 250 MCG/5ML IJ SOLN
INTRAMUSCULAR | Status: AC
  Filled 2022-01-31: qty 5

## 2022-01-31 MED ORDER — ONDANSETRON HCL 4 MG/2ML IJ SOLN
INTRAMUSCULAR | Status: AC
  Filled 2022-01-31: qty 2

## 2022-01-31 MED ORDER — MIDAZOLAM HCL 2 MG/2ML IJ SOLN
INTRAMUSCULAR | Status: AC
  Filled 2022-01-31: qty 2

## 2022-01-31 MED ORDER — OXYCODONE-ACETAMINOPHEN 10-325 MG PO TABS: 1.0000 | ORAL_TABLET | ORAL | 0 refills | Status: DC | PRN

## 2022-01-31 MED ORDER — ACETAMINOPHEN 325 MG PO TABS
ORAL_TABLET | ORAL | Status: AC
  Filled 2022-01-31: qty 1

## 2022-01-31 MED ORDER — CHLORHEXIDINE GLUCONATE 0.12 % MT SOLN
15.0000 mL | Freq: Once | OROMUCOSAL | Status: DC
  Filled 2022-01-31: qty 15

## 2022-01-31 MED ORDER — OXYCODONE HCL 5 MG PO TABS
5.0000 mg | ORAL_TABLET | Freq: Once | ORAL | Status: AC | PRN
  Administered 2022-01-31: 5 mg via ORAL

## 2022-01-31 MED ORDER — ONDANSETRON HCL 4 MG/2ML IJ SOLN
INTRAMUSCULAR | Status: DC | PRN
  Administered 2022-01-31: 4 mg via INTRAVENOUS

## 2022-01-31 MED ORDER — 0.9 % SODIUM CHLORIDE (POUR BTL) OPTIME
TOPICAL | Status: DC | PRN
  Administered 2022-01-31: 1000 mL

## 2022-01-31 SURGICAL SUPPLY — 37 items
BAG COUNTER SPONGE SURGICOUNT (BAG) IMPLANT
BLADE SURG 21 STRL SS (BLADE) ×2 IMPLANT
BNDG COHESIVE 6X5 TAN STRL LF (GAUZE/BANDAGES/DRESSINGS) IMPLANT
BNDG GAUZE ELAST 4 BULKY (GAUZE/BANDAGES/DRESSINGS) ×4 IMPLANT
COVER SURGICAL LIGHT HANDLE (MISCELLANEOUS) ×4 IMPLANT
DRAPE DERMATAC (DRAPES) ×3 IMPLANT
DRAPE U-SHAPE 47X51 STRL (DRAPES) ×2 IMPLANT
DRESSING PREVENA PLUS CUSTOM (GAUZE/BANDAGES/DRESSINGS) IMPLANT
DRSG ADAPTIC 3X8 NADH LF (GAUZE/BANDAGES/DRESSINGS) ×2 IMPLANT
DRSG PREVENA PLUS CUSTOM (GAUZE/BANDAGES/DRESSINGS) ×2
DURAPREP 26ML APPLICATOR (WOUND CARE) ×2 IMPLANT
ELECT REM PT RETURN 9FT ADLT (ELECTROSURGICAL)
ELECTRODE REM PT RTRN 9FT ADLT (ELECTROSURGICAL) IMPLANT
GAUZE SPONGE 4X4 12PLY STRL (GAUZE/BANDAGES/DRESSINGS) ×2 IMPLANT
GLOVE BIOGEL PI IND STRL 9 (GLOVE) ×1 IMPLANT
GLOVE BIOGEL PI INDICATOR 9 (GLOVE) ×2
GLOVE SURG ORTHO 9.0 STRL STRW (GLOVE) ×2 IMPLANT
GOWN STRL REUS W/ TWL XL LVL3 (GOWN DISPOSABLE) ×2 IMPLANT
GOWN STRL REUS W/TWL XL LVL3 (GOWN DISPOSABLE) ×4
GRAFT SKIN WND MICRO 38 (Tissue) ×1 IMPLANT
HANDPIECE INTERPULSE COAX TIP (DISPOSABLE)
IMMOBILIZER KNEE 20 (SOFTGOODS) ×2
IMMOBILIZER KNEE 20 THIGH 36 (SOFTGOODS) IMPLANT
KIT BASIN OR (CUSTOM PROCEDURE TRAY) ×2 IMPLANT
KIT TURNOVER KIT B (KITS) ×2 IMPLANT
MANIFOLD NEPTUNE II (INSTRUMENTS) ×2 IMPLANT
NS IRRIG 1000ML POUR BTL (IV SOLUTION) ×2 IMPLANT
PACK ORTHO EXTREMITY (CUSTOM PROCEDURE TRAY) ×2 IMPLANT
PAD ARMBOARD 7.5X6 YLW CONV (MISCELLANEOUS) ×4 IMPLANT
SET HNDPC FAN SPRY TIP SCT (DISPOSABLE) IMPLANT
STOCKINETTE IMPERVIOUS 9X36 MD (GAUZE/BANDAGES/DRESSINGS) IMPLANT
SUT ETHILON 2 0 PSLX (SUTURE) ×2 IMPLANT
SWAB COLLECTION DEVICE MRSA (MISCELLANEOUS) ×2 IMPLANT
SWAB CULTURE ESWAB REG 1ML (MISCELLANEOUS) IMPLANT
TOWEL GREEN STERILE (TOWEL DISPOSABLE) ×2 IMPLANT
TUBE CONNECTING 12X1/4 (SUCTIONS) ×2 IMPLANT
YANKAUER SUCT BULB TIP NO VENT (SUCTIONS) ×2 IMPLANT

## 2022-01-31 NOTE — Anesthesia Postprocedure Evaluation (Signed)
Anesthesia Post Note  Patient: Cody Pena  Procedure(s) Performed: LEFT KNEE DEBRIDEMENT AND APPLY TISSUE GRAFT (Left)     Patient location during evaluation: PACU Anesthesia Type: General Level of consciousness: awake and alert Pain management: pain level controlled Vital Signs Assessment: post-procedure vital signs reviewed and stable Respiratory status: spontaneous breathing, nonlabored ventilation, respiratory function stable and patient connected to nasal cannula oxygen Cardiovascular status: blood pressure returned to baseline and stable Postop Assessment: no apparent nausea or vomiting Anesthetic complications: no   No notable events documented.  Last Vitals:  Vitals:   01/31/22 1330 01/31/22 1345  BP: (!) 151/72 131/69  Pulse: 81 81  Resp: (!) 9 (!) 25  Temp:  (!) 36.1 C  SpO2: 93% 93%    Last Pain:  Vitals:   01/31/22 1345  TempSrc:   PainSc: 6                  Jerolene Kupfer

## 2022-01-31 NOTE — H&P (Signed)
Cody Pena is an 61 y.o. male.   Chief Complaint: Left knee chronic ulceration. HPI: Is a 61 year old gentleman who is status post debridement and skin grafting for ulceration medial aspect left knee.  Has had wound dehiscence and presents at this time for revision debridement and tissue graft.  Past Medical History:  Diagnosis Date   Arthritis    COVID    has had Covid 3 times, not sick with it   COVID    has had 3 times mild to no symptoms   Depression    Diabetes mellitus without complication (HCC)    pt states he has now been told that he is a type 1 diabetic.   Dyspnea    albuterol inhaler   HLD (hyperlipidemia)    Hypertension    Peripheral vascular disease (HCC)     Past Surgical History:  Procedure Laterality Date   ABDOMINAL AORTOGRAM W/LOWER EXTREMITY N/A 08/11/2021   Procedure: ABDOMINAL AORTOGRAM W/LOWER EXTREMITY;  Surgeon: Leonie Douglas, MD;  [facility];  Service: Cardiovascular;  Laterality: N/A;   AMPUTATION Left 11/15/2017   Procedure: LEFT FOOT 2ND RAY AMPUTATION;  Surgeon: Nadara Mustard, MD;  Location: [facility] OR;  Service: Orthopedics;  Laterality: Left;   AMPUTATION Right 08/25/2021   Procedure: RIGHT TRANSMETATARSAL AMPUTATION;  Surgeon: Nadara Mustard, MD;  Location: [facility] OR;  Service: Orthopedics;  Laterality: Right;   AMPUTATION Right 09/27/2021   Procedure: BELOW KNEE AMPUTATION;  Surgeon: Nadara Mustard, MD;  Location: [facility] OR;  Service: Orthopedics;  Laterality: Right;   APPLICATION OF WOUND VAC  12/27/2021   Procedure: APPLICATION OF WOUND VAC;  Surgeon: Nadara Mustard, MD;  Location: [facility] OR;  Service: Orthopedics;;   CERVICAL FUSION     I & D EXTREMITY Left 12/27/2021   Procedure: LEFT LEG DEBRIDEMENT ULCERS;  Surgeon: Nadara Mustard, MD;  Location: [facility] OR;  Service: Orthopedics;  Laterality: Left;   IR ANGIOGRAM EXTREMITY LEFT  11/14/2017   IR ANGIOGRAM SELECTIVE EACH ADDITIONAL VESSEL  11/14/2017   IR ANGIOGRAM SELECTIVE EACH ADDITIONAL  VESSEL  11/14/2017   IR TIB-PERO ART PTA MOD SED  11/14/2017   IR TIB-PERO ART UNI PTA EA ADD VESSEL MOD SED  11/14/2017   IR US GUIDE VASC ACCESS LEFT  11/14/2017   PERIPHERAL VASCULAR INTERVENTION Right 08/11/2021   Procedure: PERIPHERAL VASCULAR INTERVENTION;  Surgeon: Leonie Douglas, MD;  [facility];  Service: Cardiovascular;  Laterality: Right;  rt sfa stent    History reviewed. No pertinent family history. Social History:  reports that he has been smoking cigarettes. He has a 23.50 pack-year smoking history. He has never used smokeless tobacco. He reports that he does not drink alcohol and does not use drugs.  Allergies:  Allergies  Allergen Reactions   Ceftriaxone Itching, Nausea And Vomiting and Rash    Noted minutes after starting medication    No medications prior to admission.    No results found for this or any previous visit (from the past 48 hour(s)). No results found.  Review of Systems  All other systems reviewed and are negative.   There were no vitals taken for this visit. Physical Exam  Patient is alert oriented no adenopathy well-dressed normal affect normal respiratory effort.  The left knee incision has gaped open there is tunneling there is granulation tissue in the wound bed there is serosanguineous drainage.  There is no purulence no odor. Assessment/Plan Assessment: Chronic medial  wound left knee.  Plan: We will plan for debridement of the wound application of tissue graft application of a wound VAC.  We will need to place him in a knee immobilizer to keep tension off the wound.  Risk and benefits were discussed including risk of the wound not healing need for additional surgery.  Patient states he understands wished to proceed at this time.    Nadara Mustard, MD 01/31/2022, 6:35 AM

## 2022-01-31 NOTE — Transfer of Care (Signed)
Immediate Anesthesia Transfer of Care Note  Patient: Cody Pena  Procedure(s) Performed: LEFT KNEE DEBRIDEMENT AND APPLY TISSUE GRAFT (Left)  Patient Location: PACU  Anesthesia Type:General  Level of Consciousness: drowsy  Airway & Oxygen Therapy: Patient Spontanous Breathing and Patient connected to face mask oxygen  Post-op Assessment: Report given to RN and Post -op Vital signs reviewed and stable  Post vital signs: Reviewed and stable  Last Vitals:  Vitals Value Taken Time  BP 129/71 01/31/22 1215  Temp    Pulse 72 01/31/22 1217  Resp 16 01/31/22 1217  SpO2 99 % 01/31/22 1217  Vitals shown include unvalidated device data.  Last Pain:  Vitals:   01/31/22 0959  TempSrc: Oral         Complications: No notable events documented.

## 2022-01-31 NOTE — Anesthesia Procedure Notes (Signed)
Procedure Name: LMA Insertion Date/Time: 01/31/2022 11:38 AM  Performed by: Marny Lowenstein, CRNAPre-anesthesia Checklist: Patient identified, Emergency Drugs available, Suction available and Patient being monitored Patient Re-evaluated:Patient Re-evaluated prior to induction Oxygen Delivery Method: Circle system utilized Preoxygenation: Pre-oxygenation with 100% oxygen Induction Type: IV induction LMA: LMA inserted LMA Size: 4.0 Number of attempts: 1 Placement Confirmation: positive ETCO2 and breath sounds checked- equal and bilateral Dental Injury: Teeth and Oropharynx as per pre-operative assessment

## 2022-01-31 NOTE — Op Note (Signed)
01/31/2022  12:34 PM  PATIENT:  Cody Pena    PRE-OPERATIVE DIAGNOSIS:  Wound Dehiscence Left Knee Wound  POST-OPERATIVE DIAGNOSIS:  Same  PROCEDURE:  LEFT KNEE DEBRIDEMENT AND APPLY TISSUE GRAFT Local tissue rearrangement for wound closure 10 x 10 cm. Application of customizable Prevena wound VAC. Application of Kerecis micro graft 38 cm.  SURGEON:  Nadara Mustard, MD  PHYSICIAN ASSISTANT:None ANESTHESIA:   General  PREOPERATIVE INDICATIONS:  [name] is a  61 y.o. male with a diagnosis of Wound Dehiscence Left Knee Wound who failed conservative measures and elected for surgical management.    The risks benefits and alternatives were discussed with the patient preoperatively including but not limited to the risks of infection, bleeding, nerve injury, cardiopulmonary complications, the need for revision surgery, among others, and the patient was willing to proceed.  OPERATIVE IMPLANTS: Kerecis micro graft 38 cm  @ENCIMAGES @  OPERATIVE FINDINGS: Good petechial bleeding.  OPERATIVE PROCEDURE: Patient brought the operating room and underwent general anesthetic.  After adequate levels anesthesia were obtained patient's left lower extremity was prepped using DuraPrep draped into a sterile field a timeout was called.  Patient had a wound that was approximately 5 cm in diameter.  The wound bed was ellipsed out and this left a wound that was 10 x 10 cm.  The base of the wound was also debrided the tissue margins were clear healthy and viable with bleeding petechial tissue.  This was irrigated normal saline electrocautery was used for hemostasis.  The 38 cm Kerecis graft was applied local tissue rearrangement was used to close the skin over the wound.  2-0 nylon was used the wound was undermined and the incision extended to allow for wound closure.  A [name] customizable wound VAC was applied this had a good suction fit patient was placed in a knee immobilizer extubated taken the  PACU in stable condition.  Debridement type: Excisional Debridement  Side: left  Body Location: knee   Tools used for debridement: scalpel, scissors, curette, and rongeur  Pre-debridement Wound size (cm):   Length: 5        Width: 5     Depth: 1   Post-debridement Wound size (cm):   Length: 10        Width: 10     Depth: 1   Debridement depth beyond dead/damaged tissue down to healthy viable tissue: yes  Tissue layer involved: skin, subcutaneous tissue, muscle / fascia  Nature of tissue removed: Non-viable tissue  Irrigation volume: 1 liter     Irrigation fluid type: Normal Saline     DISCHARGE PLANNING:  Antibiotic duration: Preoperative antibiotics  Weightbearing: Weightbearing as tolerated  Pain medication: Prescription for Percocet  Dressing care/ Wound VAC: Continue wound VAC for 1 week extra canister provided  Ambulatory devices: Walker or crutches  Discharge to: Home.  Follow-up: In the office 1 week post operative.

## 2022-01-31 NOTE — Anesthesia Preprocedure Evaluation (Signed)
Anesthesia Evaluation  Patient identified by MRN, date of birth, ID band Patient awake    Reviewed: Allergy & Precautions, NPO status , Patient's Chart, lab work & pertinent test results  Airway Mallampati: II       Dental  (+) Poor Dentition, Missing, Loose, Dental Advisory Given   Pulmonary Current Smoker and Patient abstained from smoking.,    Pulmonary exam normal breath sounds clear to auscultation       Cardiovascular hypertension, Pt. on medications + Peripheral Vascular Disease  Normal cardiovascular exam Rhythm:Regular Rate:Normal     Neuro/Psych PSYCHIATRIC DISORDERS Depression Diabetic peripheral neuropathy  Neuromuscular disease    GI/Hepatic negative GI ROS, Neg liver ROS,   Endo/Other  diabetes, Poorly Controlled, Type 2, Oral Hypoglycemic Agents, Insulin DependentHyperlipidemia  Renal/GU negative Renal ROS  negative genitourinary   Musculoskeletal  (+) Arthritis , Osteoarthritis,  Osteomyelitis right foot S/P ray amputation left foot   Abdominal   Peds  Hematology  (+) Blood dyscrasia, anemia , Plavix therapy- last dose   Anesthesia Other Findings Chief Complaint: Left knee chronic ulceration. HPI: Is a 61 year old gentleman who is status post debridement and skin grafting for ulceration medial aspect left knee.  Has had wound dehiscence and presents at this time for revision debridement and tissue graft.  Reproductive/Obstetrics                            Anesthesia Physical  Anesthesia Plan  ASA: 3  Anesthesia Plan: General   Post-op Pain Management:    Induction: Intravenous  PONV Risk Score and Plan: 1 and Treatment may vary due to age or medical condition and Ondansetron  Airway Management Planned: LMA  Additional Equipment: None  Intra-op Plan:   Post-operative Plan: Extubation in OR  Informed Consent: I have reviewed the patients History and Physical,  chart, labs and discussed the procedure including the risks, benefits and alternatives for the proposed anesthesia with the patient or authorized representative who has indicated his/her understanding and acceptance.     Dental advisory given  Plan Discussed with: CRNA and Anesthesiologist  Anesthesia Plan Comments:        Anesthesia Quick Evaluation

## 2022-01-31 NOTE — Progress Notes (Signed)
Patient alert talkative was awaiting for family to transported home patient discharged [phone]

## 2022-02-01 ENCOUNTER — Encounter (HOSPITAL_COMMUNITY): Payer: Self-pay | Admitting: Orthopedic Surgery

## 2022-02-06 ENCOUNTER — Ambulatory Visit (INDEPENDENT_AMBULATORY_CARE_PROVIDER_SITE_OTHER): Payer: 59 | Admitting: Family

## 2022-02-06 DIAGNOSIS — L97921 Non-pressure chronic ulcer of unspecified part of left lower leg limited to breakdown of skin: Secondary | ICD-10-CM

## 2022-02-06 MED ORDER — SULFAMETHOXAZOLE-TRIMETHOPRIM 800-160 MG PO TABS: 1.0000 | ORAL_TABLET | Freq: Two times a day (BID) | ORAL | 0 refills | Status: DC

## 2022-02-06 NOTE — Progress Notes (Signed)
Post-Op Visit Note   Patient: Cody Pena           Date of Birth: 1961/04/26           MRN: [id number] Visit Date: 02/06/2022 PCP: Pcp, No  Chief Complaint:  Chief Complaint  Patient presents with   Left Knee - Routine Post Op    01/31/22 knee deb w/ kerecis skin graft    HPI:  HPI The patient is a 61 year old gentleman who presents status post left knee debridement with Kerecis placement on June 21 wound VAC removed today. Ortho Exam On examination of the left knee the incision is approximated with sutures majority of this is closed there is 1 area that is open distally about the size of a quarter.  Graft visible in place.  In the wound bed there is some surrounding erythema and dermatitis no maceration no warmth no drainage  Visit Diagnoses: No diagnosis found.  Plan: Some mild concern for cellulitis.  Will provide a prescription for antibiotic.  Discussed with patient if the surrounding skin does not clear of erythema tomorrow he will begin taking this.  Discussed return precautions as well patient comfortable with the plan he will begin daily Dial soap cleansing dressings with nonadherent dressing and Ace wrap.  Follow-Up Instructions: No follow-ups on file.   Imaging: No results found.  Orders:  No orders of the defined types were placed in this encounter.  Meds ordered this encounter  Medications   sulfamethoxazole-trimethoprim (BACTRIM DS) 800-160 MG tablet    Sig: Take 1 tablet by mouth 2 (two) times daily.    Dispense:  20 tablet    Refill:  0     PMFS History: Patient Active Problem List   Diagnosis Date Noted   Malnutrition of moderate degree 12/28/2021   Abscess of left lower leg    Wound infection 12/26/2021   S/P BKA (below knee amputation), right (HCC) 09/30/2021   Below-knee amputation of right lower extremity (HCC) 09/27/2021   Dehiscence of amputation stump of right lower extremity (HCC)    Wound infection after surgery 09/16/2021    Cellulitis of right lower extremity    Peripheral vascular disease (HCC)    History of transmetatarsal amputation of right foot (HCC) 08/25/2021   Osteomyelitis (HCC) 07/24/2021   HTN (hypertension) 07/23/2021   Hyperlipidemia 07/23/2021   Diabetic foot ulcers (HCC) 07/23/2021   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) 01/14/2018   Ulcer of left foot, limited to breakdown of skin (HCC) 01/14/2018   Foot osteomyelitis, left (HCC)    Septic arthritis of foot (HCC) 11/11/2017   Osteomyelitis of ankle or foot, acute, left (HCC) 11/11/2017   Type 2 DM with diabetic peripheral angiopathy w/o gangrene (HCC) 11/11/2017   Cigarette smoker 11/11/2017   Diabetic peripheral neuropathy (HCC) 11/11/2017   Diabetic foot infection (HCC) 11/10/2017   Post op infection    Delirium 12/07/2014   Cervical vertebral fusion 12/07/2014   Hallucination 12/07/2014   Poorly controlled type 2 diabetes mellitus (HCC) 12/07/2014   Mood disorder (HCC) 12/07/2014   Pyrexia    Neck swelling    Past Medical History:  Diagnosis Date   Arthritis    COVID    has had Covid 3 times, not sick with it   COVID    has had 3 times mild to no symptoms   Depression    Diabetes mellitus without complication (HCC)    pt states he has now  been told that he is a type 1 diabetic.   Dyspnea    albuterol inhaler   HLD (hyperlipidemia)    Hypertension    Peripheral vascular disease (HCC)     No family history on file.  Past Surgical History:  Procedure Laterality Date   ABDOMINAL AORTOGRAM W/LOWER EXTREMITY N/A 08/11/2021   Procedure: ABDOMINAL AORTOGRAM W/LOWER EXTREMITY;  Surgeon: Leonie Douglas, MD;  [facility]: [facility];  Service: Cardiovascular;  Laterality: N/A;   AMPUTATION Left 11/15/2017   Procedure: LEFT FOOT 2ND RAY AMPUTATION;  Surgeon: Nadara Mustard, MD;  Location: [facility] OR;  Service: Orthopedics;  Laterality: Left;   AMPUTATION Right 08/25/2021   Procedure: RIGHT  TRANSMETATARSAL AMPUTATION;  Surgeon: Nadara Mustard, MD;  Location: [facility] OR;  Service: Orthopedics;  Laterality: Right;   AMPUTATION Right 09/27/2021   Procedure: BELOW KNEE AMPUTATION;  Surgeon: Nadara Mustard, MD;  Location: [facility] OR;  Service: Orthopedics;  Laterality: Right;   APPLICATION OF WOUND VAC  12/27/2021   Procedure: APPLICATION OF WOUND VAC;  Surgeon: Nadara Mustard, MD;  Location: [facility] OR;  Service: Orthopedics;;   CERVICAL FUSION     I & D EXTREMITY Left 12/27/2021   Procedure: LEFT LEG DEBRIDEMENT ULCERS;  Surgeon: Nadara Mustard, MD;  Location: [facility] OR;  Service: Orthopedics;  Laterality: Left;   I & D EXTREMITY Left 01/31/2022   Procedure: LEFT KNEE DEBRIDEMENT AND APPLY TISSUE GRAFT;  Surgeon: Nadara Mustard, MD;  Location: [facility] OR;  Service: Orthopedics;  Laterality: Left;   IR ANGIOGRAM EXTREMITY LEFT  11/14/2017   IR ANGIOGRAM SELECTIVE EACH ADDITIONAL VESSEL  11/14/2017   IR ANGIOGRAM SELECTIVE EACH ADDITIONAL VESSEL  11/14/2017   IR TIB-PERO ART PTA MOD SED  11/14/2017   IR TIB-PERO ART UNI PTA EA ADD VESSEL MOD SED  11/14/2017   IR US GUIDE VASC ACCESS LEFT  11/14/2017   PERIPHERAL VASCULAR INTERVENTION Right 08/11/2021   Procedure: PERIPHERAL VASCULAR INTERVENTION;  Surgeon: Leonie Douglas, MD;  Location: [facility];  Service: Cardiovascular;  Laterality: Right;  rt sfa stent   Social History   Occupational History   Not on file  Tobacco Use   Smoking status: Every Day    Packs/day: 0.50    Years: 47.00    Total pack years: 23.50    Types: Cigarettes   Smokeless tobacco: Never  Vaping Use   Vaping Use: Some days  Substance and Sexual Activity   Alcohol use: No   Drug use: No   Sexual activity: Not on file

## 2022-02-07 ENCOUNTER — Encounter: Payer: Self-pay | Admitting: Family

## 2022-02-14 ENCOUNTER — Encounter: Payer: Self-pay | Admitting: Family

## 2022-02-14 ENCOUNTER — Ambulatory Visit (INDEPENDENT_AMBULATORY_CARE_PROVIDER_SITE_OTHER): Payer: 59 | Admitting: Family

## 2022-02-14 DIAGNOSIS — L97921 Non-pressure chronic ulcer of unspecified part of left lower leg limited to breakdown of skin: Secondary | ICD-10-CM

## 2022-02-14 MED ORDER — OXYCODONE-ACETAMINOPHEN 10-325 MG PO TABS: 1.0000 | ORAL_TABLET | Freq: Four times a day (QID) | ORAL | 0 refills | Status: DC | PRN

## 2022-02-14 NOTE — Progress Notes (Signed)
Post-Op Visit Note   Patient: Cody Pena           Date of Birth: [birth year]           MRN: [id number] Visit Date: 02/14/2022 PCP: Pcp, No  Chief Complaint: No chief complaint on file.   HPI:  HPI The patient is a 61 year old gentleman who is seen today status post irrigation debridement with kerecis placement left knee on 01/31/22.  Currently on bactrim  Today he is concerned for some opening up of his incision he states he does remove the immobilizer when he sleeps.  Concerned that the wound area on his thigh are having more pain  Ortho Exam On examination of the left knee he has had some further distal dehiscence of his incision this has 2 mm of depth is open 4 cm wide there is granulation in the wound bed about 10% fibrinous exudative tissue there is surrounding erythema and tenderness he does have edema in the lower extremity down to the foot this is pitting there is no ascending cellulitis  Visit Diagnoses: No diagnosis found.  Plan: We will begin using a medical compression stocking sleeves over his knee with direct skin contact changes daily continue the knee immobilizer he will wear this at night with sleep as well  Follow-Up Instructions: No follow-ups on file.   Imaging: No results found.  Orders:  No orders of the defined types were placed in this encounter.  No orders of the defined types were placed in this encounter.    PMFS History: Patient Active Problem List   Diagnosis Date Noted   Malnutrition of moderate degree 12/28/2021   Abscess of left lower leg    Wound infection 12/26/2021   S/P BKA (below knee amputation), right (HCC) 09/30/2021   Below-knee amputation of right lower extremity (HCC) 09/27/2021   Dehiscence of amputation stump of right lower extremity (HCC)    Wound infection after surgery 09/16/2021   Cellulitis of right lower extremity    Peripheral vascular disease (HCC)    History of transmetatarsal amputation of right foot  (HCC) 08/25/2021   Osteomyelitis (HCC) 07/24/2021   HTN (hypertension) 07/23/2021   Hyperlipidemia 07/23/2021   Diabetic foot ulcers (HCC) 07/23/2021   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) 01/14/2018   Ulcer of left foot, limited to breakdown of skin (HCC) 01/14/2018   Foot osteomyelitis, left (HCC)    Septic arthritis of foot (HCC) 11/11/2017   Osteomyelitis of ankle or foot, acute, left (HCC) 11/11/2017   Type 2 DM with diabetic peripheral angiopathy w/o gangrene (HCC) 11/11/2017   Cigarette smoker 11/11/2017   Diabetic peripheral neuropathy (HCC) 11/11/2017   Diabetic foot infection (HCC) 11/10/2017   Post op infection    Delirium 12/07/2014   Cervical vertebral fusion 12/07/2014   Hallucination 12/07/2014   Poorly controlled type 2 diabetes mellitus (HCC) 12/07/2014   Mood disorder (HCC) 12/07/2014   Pyrexia    Neck swelling    Past Medical History:  Diagnosis Date   Arthritis    COVID    has had Covid 3 times, not sick with it   COVID    has had 3 times mild to no symptoms   Depression    Diabetes mellitus without complication (HCC)    pt states he has now been told that he is a type 1 diabetic.   Dyspnea    albuterol inhaler   HLD (hyperlipidemia)    Hypertension  Peripheral vascular disease (HCC)     History reviewed. No pertinent family history.  Past Surgical History:  Procedure Laterality Date   ABDOMINAL AORTOGRAM W/LOWER EXTREMITY N/A 08/11/2021   Procedure: ABDOMINAL AORTOGRAM W/LOWER EXTREMITY;  Surgeon: Leonie Douglas, MD;  Location: [facility];  Service: Cardiovascular;  Laterality: N/A;   AMPUTATION Left 11/15/2017   Procedure: LEFT FOOT 2ND RAY AMPUTATION;  Surgeon: Nadara Mustard, MD;  Location: [facility] OR;  Service: Orthopedics;  Laterality: Left;   AMPUTATION Right 08/25/2021   Procedure: RIGHT TRANSMETATARSAL AMPUTATION;  Surgeon: Nadara Mustard, MD;  Location: [facility] OR;  Service: Orthopedics;  Laterality: Right;    AMPUTATION Right 09/27/2021   Procedure: BELOW KNEE AMPUTATION;  Surgeon: Nadara Mustard, MD;  Location: [facility] OR;  Service: Orthopedics;  Laterality: Right;   APPLICATION OF WOUND VAC  12/27/2021   Procedure: APPLICATION OF WOUND VAC;  Surgeon: Nadara Mustard, MD;  Location: [facility] OR;  Service: Orthopedics;;   CERVICAL FUSION     I & D EXTREMITY Left 12/27/2021   Procedure: LEFT LEG DEBRIDEMENT ULCERS;  Surgeon: Nadara Mustard, MD;  Location: [facility] OR;  Service: Orthopedics;  Laterality: Left;   I & D EXTREMITY Left 01/31/2022   Procedure: LEFT KNEE DEBRIDEMENT AND APPLY TISSUE GRAFT;  Surgeon: Nadara Mustard, MD;  Location: [facility] OR;  Service: Orthopedics;  Laterality: Left;   IR ANGIOGRAM EXTREMITY LEFT  11/14/2017   IR ANGIOGRAM SELECTIVE EACH ADDITIONAL VESSEL  11/14/2017   IR ANGIOGRAM SELECTIVE EACH ADDITIONAL VESSEL  11/14/2017   IR TIB-PERO ART PTA MOD SED  11/14/2017   IR TIB-PERO ART UNI PTA EA ADD VESSEL MOD SED  11/14/2017   IR US GUIDE VASC ACCESS LEFT  11/14/2017   PERIPHERAL VASCULAR INTERVENTION Right 08/11/2021   Procedure: PERIPHERAL VASCULAR INTERVENTION;  Surgeon: Leonie Douglas, MD;  Location: [facility];  Service: Cardiovascular;  Laterality: Right;  rt sfa stent   Social History   Occupational History   Not on file  Tobacco Use   Smoking status: Every Day    Packs/day: 0.50    Years: 47.00    Total pack years: 23.50    Types: Cigarettes   Smokeless tobacco: Never  Vaping Use   Vaping Use: Some days  Substance and Sexual Activity   Alcohol use: No   Drug use: No   Sexual activity: Not on file

## 2022-02-28 ENCOUNTER — Encounter: Payer: 59 | Admitting: Family

## 2022-03-09 ENCOUNTER — Encounter: Payer: Self-pay | Admitting: Family

## 2022-03-09 ENCOUNTER — Ambulatory Visit (INDEPENDENT_AMBULATORY_CARE_PROVIDER_SITE_OTHER): Payer: 59 | Admitting: Family

## 2022-03-09 DIAGNOSIS — L02416 Cutaneous abscess of left lower limb: Secondary | ICD-10-CM

## 2022-03-09 NOTE — Progress Notes (Signed)
Post-Op Visit Note   Patient: Cody Pena           Date of Birth: [birth year]           MRN: [id number] Visit Date: 03/09/2022 PCP: Pcp, No  Chief Complaint:  Chief Complaint  Patient presents with   Left Knee - Routine Post Op    01/31/22 left knee deb    HPI:  HPI The patient is a 62 year old gentleman seen in follow-up status post left knee irrigation and debridement with Kerecis placement.  He is currently been wearing medical compression sleeves over his knee after cleansing daily.  He has completed a course of Bactrim and is pleased with his progress  Is no longer using the immobilizer  Ortho Exam On examination of the left knee the wound is greatly improved sutures are in place there is no gaping no tunneling no depth about 10% fibrinous exudative tissue the compression garment is doing great debriding the wound  Please see attached photo  Visit Diagnoses: No diagnosis found.  Plan: Continue daily Dial soap cleansing.  Compression garment with direct skin contact follow-up in office in 3 weeks  Follow-Up Instructions: No follow-ups on file.   Imaging: No results found.  Orders:  No orders of the defined types were placed in this encounter.  No orders of the defined types were placed in this encounter.    PMFS History: Patient Active Problem List   Diagnosis Date Noted   Malnutrition of moderate degree 12/28/2021   Abscess of left lower leg    Wound infection 12/26/2021   S/P BKA (below knee amputation), right (HCC) 09/30/2021   Below-knee amputation of right lower extremity (HCC) 09/27/2021   Dehiscence of amputation stump of right lower extremity (HCC)    Wound infection after surgery 09/16/2021   Cellulitis of right lower extremity    Peripheral vascular disease (HCC)    History of transmetatarsal amputation of right foot (HCC) 08/25/2021   Osteomyelitis (HCC) 07/24/2021   HTN (hypertension) 07/23/2021   Hyperlipidemia 07/23/2021   Diabetic  foot ulcers (HCC) 07/23/2021   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) 01/14/2018   Ulcer of left foot, limited to breakdown of skin (HCC) 01/14/2018   Foot osteomyelitis, left (HCC)    Septic arthritis of foot (HCC) 11/11/2017   Osteomyelitis of ankle or foot, acute, left (HCC) 11/11/2017   Type 2 DM with diabetic peripheral angiopathy w/o gangrene (HCC) 11/11/2017   Cigarette smoker 11/11/2017   Diabetic peripheral neuropathy (HCC) 11/11/2017   Diabetic foot infection (HCC) 11/10/2017   Post op infection    Delirium 12/07/2014   Cervical vertebral fusion 12/07/2014   Hallucination 12/07/2014   Poorly controlled type 2 diabetes mellitus (HCC) 12/07/2014   Mood disorder (HCC) 12/07/2014   Pyrexia    Neck swelling    Past Medical History:  Diagnosis Date   Arthritis    COVID    has had Covid 3 times, not sick with it   COVID    has had 3 times mild to no symptoms   Depression    Diabetes mellitus without complication (HCC)    pt states he has now been told that he is a type 1 diabetic.   Dyspnea    albuterol inhaler   HLD (hyperlipidemia)    Hypertension    Peripheral vascular disease (HCC)     No family history on file.  Past Surgical History:  Procedure Laterality Date  ABDOMINAL AORTOGRAM W/LOWER EXTREMITY N/A 08/11/2021   Procedure: ABDOMINAL AORTOGRAM W/LOWER EXTREMITY;  Surgeon: Leonie Douglas, MD;  Location: [facility];  Service: Cardiovascular;  Laterality: N/A;   AMPUTATION Left 11/15/2017   Procedure: LEFT FOOT 2ND RAY AMPUTATION;  Surgeon: Nadara Mustard, MD;  Location: [facility] OR;  Service: Orthopedics;  Laterality: Left;   AMPUTATION Right 08/25/2021   Procedure: RIGHT TRANSMETATARSAL AMPUTATION;  Surgeon: Nadara Mustard, MD;  Location: [facility] OR;  Service: Orthopedics;  Laterality: Right;   AMPUTATION Right 09/27/2021   Procedure: BELOW KNEE AMPUTATION;  Surgeon: Nadara Mustard, MD;  Location: [facility] OR;  Service: Orthopedics;   Laterality: Right;   APPLICATION OF WOUND VAC  12/27/2021   Procedure: APPLICATION OF WOUND VAC;  Surgeon: Nadara Mustard, MD;  Location: [facility] OR;  Service: Orthopedics;;   CERVICAL FUSION     I & D EXTREMITY Left 12/27/2021   Procedure: LEFT LEG DEBRIDEMENT ULCERS;  Surgeon: Nadara Mustard, MD;  Location: [facility] OR;  Service: Orthopedics;  Laterality: Left;   I & D EXTREMITY Left 01/31/2022   Procedure: LEFT KNEE DEBRIDEMENT AND APPLY TISSUE GRAFT;  Surgeon: Nadara Mustard, MD;  Location: [facility] OR;  Service: Orthopedics;  Laterality: Left;   IR ANGIOGRAM EXTREMITY LEFT  11/14/2017   IR ANGIOGRAM SELECTIVE EACH ADDITIONAL VESSEL  11/14/2017   IR ANGIOGRAM SELECTIVE EACH ADDITIONAL VESSEL  11/14/2017   IR TIB-PERO ART PTA MOD SED  11/14/2017   IR TIB-PERO ART UNI PTA EA ADD VESSEL MOD SED  11/14/2017   IR US GUIDE VASC ACCESS LEFT  11/14/2017   PERIPHERAL VASCULAR INTERVENTION Right 08/11/2021   Procedure: PERIPHERAL VASCULAR INTERVENTION;  Surgeon: Leonie Douglas, MD;  Location: [facility];  Service: Cardiovascular;  Laterality: Right;  rt sfa stent   Social History   Occupational History   Not on file  Tobacco Use   Smoking status: Every Day    Packs/day: 0.50    Years: 47.00    Total pack years: 23.50    Types: Cigarettes   Smokeless tobacco: Never  Vaping Use   Vaping Use: Some days  Substance and Sexual Activity   Alcohol use: No   Drug use: No   Sexual activity: Not on file

## 2022-03-30 ENCOUNTER — Ambulatory Visit: Payer: 59 | Admitting: Family

## 2022-04-11 ENCOUNTER — Encounter (HOSPITAL_COMMUNITY): Payer: Self-pay | Admitting: Orthopedic Surgery

## 2023-01-06 ENCOUNTER — Other Ambulatory Visit: Payer: Self-pay

## 2023-01-06 ENCOUNTER — Inpatient Hospital Stay (HOSPITAL_COMMUNITY)
Admission: EM | Admit: 2023-01-06 | Discharge: 2023-01-12 | DRG: 464 | Disposition: A | Discharge status: Home / Self Care | Payer: Medicaid Other | Attending: Internal Medicine | Admitting: Internal Medicine

## 2023-01-06 ENCOUNTER — Encounter (HOSPITAL_COMMUNITY): Payer: Self-pay | Admitting: Emergency Medicine

## 2023-01-06 ENCOUNTER — Inpatient Hospital Stay (HOSPITAL_COMMUNITY): Payer: Medicaid Other

## 2023-01-06 DIAGNOSIS — E860 Dehydration: Secondary | ICD-10-CM | POA: Diagnosis present

## 2023-01-06 DIAGNOSIS — E1151 Type 2 diabetes mellitus with diabetic peripheral angiopathy without gangrene: Secondary | ICD-10-CM | POA: Diagnosis present

## 2023-01-06 DIAGNOSIS — Z597 Insufficient social insurance and welfare support: Secondary | ICD-10-CM | POA: Diagnosis not present

## 2023-01-06 DIAGNOSIS — M868X6 Other osteomyelitis, lower leg: Secondary | ICD-10-CM | POA: Diagnosis present

## 2023-01-06 DIAGNOSIS — L97509 Non-pressure chronic ulcer of other part of unspecified foot with unspecified severity: Secondary | ICD-10-CM | POA: Diagnosis not present

## 2023-01-06 DIAGNOSIS — E1142 Type 2 diabetes mellitus with diabetic polyneuropathy: Secondary | ICD-10-CM | POA: Diagnosis present

## 2023-01-06 DIAGNOSIS — E871 Hypo-osmolality and hyponatremia: Secondary | ICD-10-CM | POA: Diagnosis present

## 2023-01-06 DIAGNOSIS — L02415 Cutaneous abscess of right lower limb: Secondary | ICD-10-CM | POA: Diagnosis present

## 2023-01-06 DIAGNOSIS — T8149XA Infection following a procedure, other surgical site, initial encounter: Secondary | ICD-10-CM | POA: Diagnosis present

## 2023-01-06 DIAGNOSIS — F17210 Nicotine dependence, cigarettes, uncomplicated: Secondary | ICD-10-CM | POA: Diagnosis present

## 2023-01-06 DIAGNOSIS — E861 Hypovolemia: Secondary | ICD-10-CM | POA: Diagnosis present

## 2023-01-06 DIAGNOSIS — T8743 Infection of amputation stump, right lower extremity: Principal | ICD-10-CM | POA: Diagnosis present

## 2023-01-06 DIAGNOSIS — F32A Depression, unspecified: Secondary | ICD-10-CM | POA: Diagnosis present

## 2023-01-06 DIAGNOSIS — L0291 Cutaneous abscess, unspecified: Secondary | ICD-10-CM

## 2023-01-06 DIAGNOSIS — E11621 Type 2 diabetes mellitus with foot ulcer: Secondary | ICD-10-CM | POA: Diagnosis not present

## 2023-01-06 DIAGNOSIS — E11622 Type 2 diabetes mellitus with other skin ulcer: Secondary | ICD-10-CM | POA: Diagnosis present

## 2023-01-06 DIAGNOSIS — Z8616 Personal history of COVID-19: Secondary | ICD-10-CM | POA: Diagnosis not present

## 2023-01-06 DIAGNOSIS — E785 Hyperlipidemia, unspecified: Secondary | ICD-10-CM | POA: Diagnosis present

## 2023-01-06 DIAGNOSIS — Z89511 Acquired absence of right leg below knee: Secondary | ICD-10-CM

## 2023-01-06 DIAGNOSIS — E1165 Type 2 diabetes mellitus with hyperglycemia: Secondary | ICD-10-CM | POA: Diagnosis present

## 2023-01-06 DIAGNOSIS — B9562 Methicillin resistant Staphylococcus aureus infection as the cause of diseases classified elsewhere: Secondary | ICD-10-CM | POA: Diagnosis present

## 2023-01-06 DIAGNOSIS — Z888 Allergy status to other drugs, medicaments and biological substances status: Secondary | ICD-10-CM

## 2023-01-06 DIAGNOSIS — E1169 Type 2 diabetes mellitus with other specified complication: Secondary | ICD-10-CM | POA: Diagnosis present

## 2023-01-06 DIAGNOSIS — I10 Essential (primary) hypertension: Secondary | ICD-10-CM | POA: Diagnosis present

## 2023-01-06 DIAGNOSIS — Z794 Long term (current) use of insulin: Secondary | ICD-10-CM | POA: Diagnosis not present

## 2023-01-06 DIAGNOSIS — Z881 Allergy status to other antibiotic agents status: Secondary | ICD-10-CM

## 2023-01-06 DIAGNOSIS — E119 Type 2 diabetes mellitus without complications: Secondary | ICD-10-CM

## 2023-01-06 DIAGNOSIS — I739 Peripheral vascular disease, unspecified: Secondary | ICD-10-CM

## 2023-01-06 DIAGNOSIS — Z7984 Long term (current) use of oral hypoglycemic drugs: Secondary | ICD-10-CM

## 2023-01-06 DIAGNOSIS — Z7902 Long term (current) use of antithrombotics/antiplatelets: Secondary | ICD-10-CM

## 2023-01-06 DIAGNOSIS — Y835 Amputation of limb(s) as the cause of abnormal reaction of the patient, or of later complication, without mention of misadventure at the time of the procedure: Secondary | ICD-10-CM | POA: Diagnosis present

## 2023-01-06 DIAGNOSIS — M869 Osteomyelitis, unspecified: Secondary | ICD-10-CM | POA: Diagnosis not present

## 2023-01-06 DIAGNOSIS — L97929 Non-pressure chronic ulcer of unspecified part of left lower leg with unspecified severity: Secondary | ICD-10-CM | POA: Diagnosis present

## 2023-01-06 DIAGNOSIS — Z1629 Resistance to other single specified antibiotic: Secondary | ICD-10-CM | POA: Diagnosis present

## 2023-01-06 DIAGNOSIS — L03115 Cellulitis of right lower limb: Secondary | ICD-10-CM | POA: Diagnosis present

## 2023-01-06 DIAGNOSIS — M86261 Subacute osteomyelitis, right tibia and fibula: Secondary | ICD-10-CM

## 2023-01-06 DIAGNOSIS — Z79899 Other long term (current) drug therapy: Secondary | ICD-10-CM | POA: Diagnosis not present

## 2023-01-06 DIAGNOSIS — B951 Streptococcus, group B, as the cause of diseases classified elsewhere: Secondary | ICD-10-CM | POA: Diagnosis present

## 2023-01-06 DIAGNOSIS — M1990 Unspecified osteoarthritis, unspecified site: Secondary | ICD-10-CM | POA: Diagnosis present

## 2023-01-06 DIAGNOSIS — Z981 Arthrodesis status: Secondary | ICD-10-CM

## 2023-01-06 LAB — BASIC METABOLIC PANEL
Anion gap: 7 (ref 5–15)
BUN: 13 mg/dL (ref 8–23)
CO2: 25 mmol/L (ref 22–32)
Calcium: 8.1 mg/dL — ABNORMAL LOW (ref 8.9–10.3)
Chloride: 98 mmol/L (ref 98–111)
Creatinine, Ser: 0.85 mg/dL (ref 0.61–1.24)
GFR, Estimated: >60 mL/min (ref >60)
Glucose, Bld: 309 mg/dL — ABNORMAL HIGH (ref 70–99)
Potassium: 3.8 mmol/L (ref 3.5–5.1)
Sodium: 130 mmol/L — ABNORMAL LOW (ref 135–145)

## 2023-01-06 LAB — PHOSPHORUS: Phosphorus: 3.4 mg/dL (ref 2.5–4.6)

## 2023-01-06 LAB — MAGNESIUM: Magnesium: 1.9 mg/dL (ref 1.7–2.4)

## 2023-01-06 LAB — COMPREHENSIVE METABOLIC PANEL
ALT: 12 U/L (ref 0–44)
AST: 12 U/L — ABNORMAL LOW (ref 15–41)
Albumin: 2.9 g/dL — ABNORMAL LOW (ref 3.5–5.0)
Alkaline Phosphatase: 82 U/L (ref 38–126)
Anion gap: 10 (ref 5–15)
BUN: 12 mg/dL (ref 8–23)
CO2: 24 mmol/L (ref 22–32)
Calcium: 8.4 mg/dL — ABNORMAL LOW (ref 8.9–10.3)
Chloride: 97 mmol/L — ABNORMAL LOW (ref 98–111)
Creatinine, Ser: 0.93 mg/dL (ref 0.61–1.24)
GFR, Estimated: >60 mL/min (ref >60)
Glucose, Bld: 334 mg/dL — ABNORMAL HIGH (ref 70–99)
Potassium: 3.6 mmol/L (ref 3.5–5.1)
Sodium: 131 mmol/L — ABNORMAL LOW (ref 135–145)
Total Bilirubin: 0.4 mg/dL (ref 0.3–1.2)
Total Protein: 6.7 g/dL (ref 6.5–8.1)

## 2023-01-06 LAB — CBC
HCT: 41.8 % (ref 39.0–52.0)
Hemoglobin: 14.2 g/dL (ref 13.0–17.0)
MCH: 31.6 pg (ref 26.0–34.0)
MCHC: 34 g/dL (ref 30.0–36.0)
MCV: 93.1 fL (ref 80.0–100.0)
Platelets: 227 10*3/uL (ref 150–400)
RBC: 4.49 MIL/uL (ref 4.22–5.81)
RDW: 13.1 % (ref 11.5–15.5)
WBC: 10.2 10*3/uL (ref 4.0–10.5)
nRBC: 0 % (ref 0.0–0.2)

## 2023-01-06 LAB — CBC WITH DIFFERENTIAL/PLATELET
Abs Immature Granulocytes: 0.05 10*3/uL (ref 0.00–0.07)
Basophils Absolute: 0.1 10*3/uL (ref 0.0–0.1)
Basophils Relative: 1 %
Eosinophils Absolute: 0.3 10*3/uL (ref 0.0–0.5)
Eosinophils Relative: 3 %
HCT: 43.5 % (ref 39.0–52.0)
Hemoglobin: 14.6 g/dL (ref 13.0–17.0)
Immature Granulocytes: 1 %
Lymphocytes Relative: 29 %
Lymphs Abs: 3.1 10*3/uL (ref 0.7–4.0)
MCH: 31.1 pg (ref 26.0–34.0)
MCHC: 33.6 g/dL (ref 30.0–36.0)
MCV: 92.6 fL (ref 80.0–100.0)
Monocytes Absolute: 0.7 10*3/uL (ref 0.1–1.0)
Monocytes Relative: 7 %
Neutro Abs: 6.4 10*3/uL (ref 1.7–7.7)
Neutrophils Relative %: 59 %
Platelets: 241 10*3/uL (ref 150–400)
RBC: 4.7 MIL/uL (ref 4.22–5.81)
RDW: 13.1 % (ref 11.5–15.5)
WBC: 10.7 10*3/uL — ABNORMAL HIGH (ref 4.0–10.5)
nRBC: 0 % (ref 0.0–0.2)

## 2023-01-06 LAB — GLUCOSE, CAPILLARY
Glucose-Capillary: 301 mg/dL — ABNORMAL HIGH (ref 70–99)
Glucose-Capillary: 153 mg/dL — ABNORMAL HIGH (ref 70–99)
Glucose-Capillary: 199 mg/dL — ABNORMAL HIGH (ref 70–99)
Glucose-Capillary: 177 mg/dL — ABNORMAL HIGH (ref 70–99)
Glucose-Capillary: 234 mg/dL — ABNORMAL HIGH (ref 70–99)
Glucose-Capillary: 186 mg/dL — ABNORMAL HIGH (ref 70–99)

## 2023-01-06 LAB — LACTIC ACID, PLASMA
Lactic Acid, Venous: 2.1 mmol/L (ref 0.5–1.9)
Lactic Acid, Venous: 1.3 mmol/L (ref 0.5–1.9)

## 2023-01-06 LAB — MRSA NEXT GEN BY PCR, NASAL: MRSA by PCR Next Gen: DETECTED — AB

## 2023-01-06 MED ORDER — POLYETHYLENE GLYCOL 3350 17 G PO PACK: 17.0000 g | PACK | Freq: Every day | ORAL | Status: DC | PRN

## 2023-01-06 MED ORDER — PROCHLORPERAZINE EDISYLATE 10 MG/2ML IJ SOLN: 5.0000 mg | Freq: Four times a day (QID) | INTRAMUSCULAR | Status: DC | PRN

## 2023-01-06 MED ORDER — OXYCODONE HCL 5 MG PO TABS
5.0000 mg | ORAL_TABLET | Freq: Four times a day (QID) | ORAL | Status: AC | PRN
  Administered 2023-01-06 – 2023-01-07 (×4): 5 mg via ORAL
  Filled 2023-01-06 (×4): qty 1

## 2023-01-06 MED ORDER — VANCOMYCIN HCL 1500 MG/300ML IV SOLN
1500.0000 mg | Freq: Two times a day (BID) | INTRAVENOUS | Status: DC
  Administered 2023-01-06 – 2023-01-09 (×6): 1500 mg via INTRAVENOUS
  Filled 2023-01-06 (×6): qty 300

## 2023-01-06 MED ORDER — INSULIN ASPART 100 UNIT/ML IJ SOLN
0.0000 [IU] | INTRAMUSCULAR | Status: DC
  Administered 2023-01-06: 3 [IU] via SUBCUTANEOUS
  Administered 2023-01-06: 7 [IU] via SUBCUTANEOUS
  Administered 2023-01-06 (×3): 2 [IU] via SUBCUTANEOUS
  Administered 2023-01-07 (×2): 3 [IU] via SUBCUTANEOUS
  Administered 2023-01-07: 2 [IU] via SUBCUTANEOUS
  Administered 2023-01-07: 3 [IU] via SUBCUTANEOUS
  Administered 2023-01-07: 1 [IU] via SUBCUTANEOUS
  Administered 2023-01-07: 3 [IU] via SUBCUTANEOUS
  Administered 2023-01-08: 5 [IU] via SUBCUTANEOUS
  Administered 2023-01-08: 3 [IU] via SUBCUTANEOUS
  Administered 2023-01-08: 2 [IU] via SUBCUTANEOUS
  Administered 2023-01-08: 3 [IU] via SUBCUTANEOUS
  Administered 2023-01-08 – 2023-01-09 (×2): 2 [IU] via SUBCUTANEOUS
  Administered 2023-01-09 (×2): 5 [IU] via SUBCUTANEOUS
  Administered 2023-01-09: 3 [IU] via SUBCUTANEOUS
  Administered 2023-01-09: 1 [IU] via SUBCUTANEOUS
  Administered 2023-01-10 (×2): 3 [IU] via SUBCUTANEOUS
  Administered 2023-01-10 (×3): 2 [IU] via SUBCUTANEOUS
  Administered 2023-01-10: 3 [IU] via SUBCUTANEOUS
  Administered 2023-01-11 (×5): 2 [IU] via SUBCUTANEOUS
  Administered 2023-01-11: 1 [IU] via SUBCUTANEOUS
  Administered 2023-01-12: 2 [IU] via SUBCUTANEOUS
  Administered 2023-01-12: 3 [IU] via SUBCUTANEOUS
  Administered 2023-01-12: 1 [IU] via SUBCUTANEOUS

## 2023-01-06 MED ORDER — SODIUM CHLORIDE 0.9 % IV SOLN
2.0000 g | Freq: Once | INTRAVENOUS | Status: AC
  Administered 2023-01-06: 2 g via INTRAVENOUS
  Filled 2023-01-06: qty 12.5

## 2023-01-06 MED ORDER — HYDROMORPHONE HCL 1 MG/ML IJ SOLN
0.5000 mg | INTRAMUSCULAR | Status: DC | PRN
  Administered 2023-01-08 (×2): 0.5 mg via INTRAVENOUS
  Filled 2023-01-06 (×2): qty 0.5

## 2023-01-06 MED ORDER — GABAPENTIN 300 MG PO CAPS
300.0000 mg | ORAL_CAPSULE | Freq: Three times a day (TID) | ORAL | Status: DC
  Administered 2023-01-06: 300 mg via ORAL
  Filled 2023-01-06: qty 1

## 2023-01-06 MED ORDER — INSULIN ASPART 100 UNIT/ML IJ SOLN: 0.0000 [IU] | INTRAMUSCULAR | Status: DC

## 2023-01-06 MED ORDER — ACETAMINOPHEN 325 MG PO TABS
650.0000 mg | ORAL_TABLET | Freq: Four times a day (QID) | ORAL | Status: DC | PRN
  Administered 2023-01-06: 650 mg via ORAL
  Filled 2023-01-06: qty 2

## 2023-01-06 MED ORDER — ENOXAPARIN SODIUM 40 MG/0.4ML IJ SOSY: 40.0000 mg | PREFILLED_SYRINGE | INTRAMUSCULAR | Status: DC

## 2023-01-06 MED ORDER — GABAPENTIN 300 MG PO CAPS
600.0000 mg | ORAL_CAPSULE | Freq: Three times a day (TID) | ORAL | Status: DC
  Administered 2023-01-06 – 2023-01-12 (×16): 600 mg via ORAL
  Filled 2023-01-06 (×16): qty 2

## 2023-01-06 MED ORDER — LACTATED RINGERS IV BOLUS
1000.0000 mL | Freq: Once | INTRAVENOUS | Status: AC
  Administered 2023-01-06: 1000 mL via INTRAVENOUS

## 2023-01-06 MED ORDER — MUPIROCIN 2 % EX OINT
TOPICAL_OINTMENT | Freq: Two times a day (BID) | CUTANEOUS | Status: AC
  Filled 2023-01-06 (×3): qty 22

## 2023-01-06 MED ORDER — SODIUM CHLORIDE 0.9 % IV SOLN: INTRAVENOUS | Status: AC

## 2023-01-06 MED ORDER — LORAZEPAM 0.5 MG PO TABS
0.5000 mg | ORAL_TABLET | Freq: Once | ORAL | Status: AC
  Administered 2023-01-06: 0.5 mg via ORAL
  Filled 2023-01-06: qty 1

## 2023-01-06 MED ORDER — NICOTINE 14 MG/24HR TD PT24
14.0000 mg | MEDICATED_PATCH | Freq: Every day | TRANSDERMAL | Status: DC
  Administered 2023-01-06 – 2023-01-12 (×7): 14 mg via TRANSDERMAL
  Filled 2023-01-06 (×7): qty 1

## 2023-01-06 MED ORDER — METRONIDAZOLE 500 MG/100ML IV SOLN
500.0000 mg | Freq: Two times a day (BID) | INTRAVENOUS | Status: DC
  Administered 2023-01-06 – 2023-01-08 (×5): 500 mg via INTRAVENOUS
  Filled 2023-01-06 (×5): qty 100

## 2023-01-06 MED ORDER — VANCOMYCIN HCL 2000 MG/400ML IV SOLN
2000.0000 mg | Freq: Once | INTRAVENOUS | Status: AC
  Administered 2023-01-06: 2000 mg via INTRAVENOUS
  Filled 2023-01-06: qty 400

## 2023-01-06 MED ORDER — SODIUM CHLORIDE 0.9 % IV SOLN
2.0000 g | Freq: Three times a day (TID) | INTRAVENOUS | Status: DC
  Administered 2023-01-06 – 2023-01-12 (×19): 2 g via INTRAVENOUS
  Filled 2023-01-06 (×19): qty 12.5

## 2023-01-06 MED ORDER — ACETAMINOPHEN 500 MG PO TABS
1000.0000 mg | ORAL_TABLET | ORAL | Status: AC
  Administered 2023-01-06: 1000 mg via ORAL
  Filled 2023-01-06: qty 2

## 2023-01-06 MED ORDER — INSULIN ASPART 100 UNIT/ML IJ SOLN
5.0000 [IU] | INTRAMUSCULAR | Status: AC
  Administered 2023-01-06: 5 [IU] via SUBCUTANEOUS

## 2023-01-06 MED ORDER — HEPARIN SODIUM (PORCINE) 5000 UNIT/ML IJ SOLN
5000.0000 [IU] | Freq: Three times a day (TID) | INTRAMUSCULAR | Status: DC
  Administered 2023-01-06 – 2023-01-08 (×7): 5000 [IU] via SUBCUTANEOUS
  Filled 2023-01-06 (×7): qty 1

## 2023-01-06 NOTE — ED Provider Notes (Signed)
[facility] DE[facility] AT [facility] Provider Note   CSN: [id number] Arrival date & time: 01/06/23  0110     History  Chief Complaint  Patient presents with   Wound Infection    Cody Pena is a 62 y.o. male.  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 but then had to have progressive amputations until February 2023 when he had a BKA done  Patient has had slightly over a week of right stump pain went to [facility] emergency department yesterday and had CT scan it seems that radiologist was not available and he was treated presumptively with clindamycin for wound infection however he received a phone call today updating him that he was found to have an abscess with associated osteomyelitis and was told to come either to this emergency department return to [facility] emergency department where he would be transferred to [facility].  Patient denies any fevers, states that he feels that the redness has improved some since starting the clindamycin.  No other areas of pain or discomfort.     Home Medications Prior to Admission medications   Medication Sig Start Date End Date Taking? Authorizing Provider  acetaminophen (TYLENOL) 325 MG tablet Take 1-2 tablets (325-650 mg total) by mouth every 4 (four) hours as needed for mild pain. 10/05/21   Angiulli, Mcarthur Rossetti, PA-C  albuterol (VENTOLIN HFA) 108 (90 Base) MCG/ACT inhaler Inhale 2 puffs into the lungs every 6 (six) hours as needed for shortness of breath. 10/05/21   Angiulli, Mcarthur Rossetti, PA-C  atorvastatin (LIPITOR) 80 MG tablet Take 1 tablet (80 mg total) by mouth daily. Patient taking differently: Take 80 mg by mouth at bedtime. 10/05/21   Angiulli, Mcarthur Rossetti, PA-C  citalopram (CELEXA) 40 MG tablet Take 40 mg by mouth at bedtime. 09/26/21   [provider]   clonazePAM (KLONOPIN) 0.5 MG tablet Take 1 tablet (0.5 mg total) by mouth 2 (two) times daily. 10/05/21   Angiulli, Mcarthur Rossetti, PA-C  clopidogrel (PLAVIX) 75 MG tablet Take 75 mg by mouth at bedtime. 12/07/21   [provider]  docusate sodium (COLACE) 100 MG capsule Take 1 capsule (100 mg total) by mouth 2 (two) times daily. 12/29/21   Marguerita Merles Latif, DO  feeding supplement (ENSURE ENLIVE / ENSURE PLUS) LIQD Take 237 mLs by mouth 2 (two) times daily with a meal. 12/29/21   Sheikh, Omair Latif, DO  gabapentin (NEURONTIN) 600 MG tablet Take 1 tablet (600 mg total) by mouth 3 (three) times daily. 10/05/21   Angiulli, Mcarthur Rossetti, PA-C  HUMALOG KWIKPEN 100 UNIT/ML KwikPen Inject 0-12 Units into the skin 3 (three) times daily. 12/07/21   [provider]  LANTUS 100 UNIT/ML injection Inject 24 Units into the skin at bedtime. 12/07/21   [provider]  leptospermum manuka honey (MEDIHONEY) PSTE paste Apply 1 application. topically daily. 12/29/21   Marguerita Merles Latif, DO  metFORMIN (GLUCOPHAGE) 500 MG tablet Take 1 tablet (500 mg total) by mouth 2 (two) times daily with a meal. 10/05/21 10/05/22  Angiulli, Mcarthur Rossetti, PA-C  methocarbamol (ROBAXIN) 500 MG tablet Take 1 tablet (500 mg total) by mouth every 6 (six) hours as needed for muscle spasms. 10/05/21   Angiulli, Mcarthur Rossetti, PA-C  Multiple Vitamin (MULTIVITAMIN WITH MINERALS) TABS tablet Take 1 tablet by mouth daily. 11/17/17   Darlin Drop,  DO  nicotine (NICODERM CQ - DOSED IN MG/24 HOURS) 21 mg/24hr patch 21 mg patch daily x2 weeks then 14 mg patch daily x3 weeks then 7 mg patch daily x3 weeks and stop 10/05/21   Angiulli, Mcarthur Rossetti, PA-C  nutrition supplement, JUVEN, (JUVEN) PACK Take 1 packet by mouth 2 (two) times daily between meals. 12/29/21   Sheikh, Omair Latif, DO  ondansetron (ZOFRAN) 4 MG tablet Take 1 tablet (4 mg total) by mouth every 6 (six) hours as needed for nausea. 12/29/21   Marguerita Merles Latif, DO  oxyCODONE-acetaminophen  (PERCOCET) 10-325 MG tablet Take 1 tablet by mouth every 6 (six) hours as needed for pain. 02/14/22   Adonis Huguenin, NP  polyethylene glycol powder (GLYCOLAX/MIRALAX) 17 GM/SCOOP powder Take 1 capful (17 g) by mouth daily as needed for mild constipation. 12/29/21   Marguerita Merles Latif, DO  sulfamethoxazole-trimethoprim (BACTRIM DS) 800-160 MG tablet Take 1 tablet by mouth 2 (two) times daily. 02/06/22   Adonis Huguenin, NP  Vitamin D, Ergocalciferol, (DRISDOL) 1.25 MG (50000 UNIT) CAPS capsule Take 1 capsule (50,000 Units total) by mouth every 7 (seven) days. Patient taking differently: Take 50,000 Units by mouth every Wednesday. 07/26/21   Ghimire, Werner Lean, MD      Allergies    Ceftriaxone    Review of Systems   Review of Systems  Physical Exam Updated Vital Signs BP (!) 141/80 (BP Location: Right Arm)   Pulse 94   Temp 98.1 F (36.7 C)   Resp 18   SpO2 96%  Physical Exam Vitals and nursing note reviewed.  Constitutional:      General: He is not in acute distress. HENT:     Head: Normocephalic and atraumatic.     Nose: Nose normal.  Eyes:     General: No scleral icterus. Cardiovascular:     Rate and Rhythm: Normal rate and regular rhythm.     Pulses: Normal pulses.     Heart sounds: Normal heart sounds.  Pulmonary:     Effort: Pulmonary effort is normal. No respiratory distress.     Breath sounds: No wheezing.  Abdominal:     Palpations: Abdomen is soft.     Tenderness: There is no abdominal tenderness.  Musculoskeletal:     Cervical back: Normal range of motion.     Right lower leg: No edema.     Left lower leg: No edema.  Skin:    General: Skin is warm and dry.     Capillary Refill: Capillary refill takes less than 2 seconds.     Comments: Redness and swelling to the distal stump of right lower extremity BKA see picture below.  Neurological:     Mental Status: He is alert. Mental status is at baseline.  Psychiatric:        Mood and Affect: Mood normal.         Behavior: Behavior normal.        ED Results / Procedures / Treatments   Labs (all labs ordered are listed, but only abnormal results are displayed) Labs Reviewed  LACTIC ACID, PLASMA - Abnormal; Notable for the following components:      Result Value   Lactic Acid, Venous 2.1 (*)    All other components within normal limits  COMPREHENSIVE METABOLIC PANEL - Abnormal; Notable for the following components:   Sodium 131 (*)    Chloride 97 (*)    Glucose, Bld 334 (*)    Calcium 8.4 (*)  Albumin 2.9 (*)    AST 12 (*)    All other components within normal limits  CBC WITH DIFFERENTIAL/PLATELET - Abnormal; Notable for the following components:   WBC 10.7 (*)    All other components within normal limits  LACTIC ACID, PLASMA    EKG None  Radiology No results found.  Procedures Procedures    Medications Ordered in ED Medications  lactated ringers bolus 1,000 mL (has no administration in time range)  acetaminophen (TYLENOL) tablet 1,000 mg (has no administration in time range)    ED Course/ Medical Decision Making/ A&P Clinical Course as of 01/06/23 0458  Sun Jan 06, 2023  0335 Clindamycin x [WF]  1610 Result Date: 01/04/2023 EXAM: CT LOWER EXTREMITY RIGHT W CONTRAST ACCESSION: [id number] [facility] CLINICAL INDICATION: 62 years old Male with Pain and swelling COMPARISON: None TECHNIQUE: CT of the right lower extremity from the distal femur through the proximal lower extremity. Sagittal and coronal reformats were provided. FINDINGS: Sequela of right lower extremity BKA. Fluid collection in the distal stump measuring approximately 4.0 x 2.6 x 2.5 cm (10:94 and 6:37). This fluid collection directly abuts the tibial stump. There is erosion of the distal tibial medullary space with subtle periosteal reaction (12:33, 6:34 compatible attentively infection/intraosseous abscess) extensive soft tissue edema. No tracking soft tissue gas. No acute fracture of the knee. Mild tricompartmental  osteophytosis. Diffuse vascular calcifications  --4.0 cm rim-enhancing organized collection within the right lower extremity stump, compatible with abscess. This fluid collection directly abuts the proximal aspect of the amputated tibia with disruption/erosion of the medullary space in the proximal tibia, compatible with osteomyelitis/intraosseous abscess. --Diffuse soft tissue edema, recommend correlation with physical exam to exclude cellulitis. No tracking soft tissue gas. ++++++++++++++++++++ MODIFIED REPORT: (01/04/2023 6:41 AM) This report has been modified from its preliminary version; you may check the prior versions of radiology report, results history link for prior report versions (if they were previously visible in Epic). The critical edited findings of osteomyelitis/intraosseous abscess were communicated via Epic secure chat message with confirmation with Dr. Alric Seton by Dr. Elfredia Nevins on 01/04/2023 6:41 AM. Per discussion abscess was incised and drained in ER. Patient will be made aware of associated osteomyelitis and given appropriate follow-up instructions. -----------------------------------------------   [WF]  0339 No anticoag, not on plavix. Not on any meds besides gabapentin clindamycin [WF]    Clinical Course User Index [WF] Gailen Shelter, [place]                             Medical Decision Making Amount and/or Complexity of Data Reviewed Labs: ordered.  Risk OTC drugs. Decision regarding hospitalization.   This patient presents to the ED for concern of wound, this involves a number of treatment options, and is a complaint that carries with it a moderate risk of complications and morbidity.    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 but then had to have progressive  amputations until February 2023 when he had a BKA done  Patient has had slightly over a week of right stump pain went to [facility] emergency department yesterday and had CT scan it seems that radiologist was not available and he was treated presumptively with clindamycin for wound infection however he received a phone call today updating  him that he was found to have an abscess with associated osteomyelitis and was told to come either to this emergency department return to [facility] emergency department where he would be transferred to [facility].  Patient denies any fevers, states that he feels that the redness has improved some since starting the clindamycin.  No other areas of pain or discomfort.    EMR reviewed including pt PMHx, past surgical history and past visits to ER.   See HPI for more details   Lab Tests:   I ordered and independently interpreted labs. Labs notable for leukocytosis of 10.7, CMP with mild hyponatremia corrects to normal given degree of hyperglycemia.   Imaging Studies:  I am not able to do see the images from patient's recent visit to [facility] emergency department however I did review imaging interpretation.  Result Date: 01/04/2023 EXAM: CT LOWER EXTREMITY RIGHT W CONTRAST ACCESSION: [id number] [facility] CLINICAL INDICATION: 62 years old Male with Pain and swelling COMPARISON: None TECHNIQUE: CT of the right lower extremity from the distal femur through the proximal lower extremity. Sagittal and coronal reformats were provided. FINDINGS: Sequela of right lower extremity BKA. Fluid collection in the distal stump measuring approximately 4.0 x 2.6 x 2.5 cm (10:94 and 6:37). This fluid collection directly abuts the tibial stump. There is erosion of the distal tibial medullary space with subtle periosteal reaction (12:33, 6:34 compatible attentively infection/intraosseous abscess) extensive soft tissue edema. No tracking soft tissue gas. No acute fracture of the knee. Mild  tricompartmental osteophytosis. Diffuse vascular calcifications  --4.0 cm rim-enhancing organized collection within the right lower extremity stump, compatible with abscess. This fluid collection directly abuts the proximal aspect of the amputated tibia with disruption/erosion of the medullary space in the proximal tibia, compatible with osteomyelitis/intraosseous abscess. --Diffuse soft tissue edema, recommend correlation with physical exam to exclude cellulitis. No tracking soft tissue gas. ++++++++++++++++++++ MODIFIED REPORT: (01/04/2023 6:41 AM) This report has been modified from its preliminary version; you may check the prior versions of radiology report, results history link for prior report versions (if they were previously visible in Epic). The critical edited findings of osteomyelitis/intraosseous abscess were communicated via Epic secure chat message with confirmation with Dr. Alric Seton by Dr. Elfredia Nevins on 01/04/2023 6:41 AM. Per discussion abscess was incised and drained in ER. Patient will be made aware of associated osteomyelitis and given appropriate follow-up instructions. -----------------------------------------------    Cardiac Monitoring:      Medicines ordered:  I ordered medication including Tylenol 1 g, vancomycin, lactated Ringer's for osteomyelitis Reevaluation of the patient after these medicines showed that the patient stayed the same I have reviewed the patients home medicines and have made adjustments as needed   Critical Interventions:     Consults/Attending Physician   I requested consultation with hospital Dr. Raphael Gibney who will admit,  and discussed lab and imaging findings as well as pertinent plan - they recommend:     Reevaluation:  After the interventions noted above I re-evaluated patient and found that they have :stayed the same   Social Determinants of Health:  The patient's social determinants of health were a factor in the care of  this patient  Patient is without PCP and is currently not on medication for diabetes  Problem List / ED Course:  Osteomyelitis with abscess diagnosed at outside hospital.  Patient has red swollen BKA.  Will treat with vancomycin and admitted to the hospital service.   Dispostion:  After consideration of the diagnostic results and the  patients response to treatment, I feel that the patent would benefit from admission   Final Clinical Impression(s) / ED Diagnoses Final diagnoses:  Osteomyelitis of right tibia, unspecified type [facility])    Rx / DC Orders ED Discharge Orders     None         Gailen Shelter, [place] 01/06/23 0500    Marily Memos, MD 01/06/23 [phone]

## 2023-01-06 NOTE — Progress Notes (Signed)
Patient transfer to MRI,Ativan given prior to MRI,Central telemetry notified.

## 2023-01-06 NOTE — ED Notes (Signed)
ED TO INPATIENT HANDOFF REPORT  ED Nurse Name and Phone #:  [phone] Raynaldo Opitz Name/Age/Gender Cody Pena 62 y.o. male Room/Bed: 025C/025C  Code Status   Code Status: Prior  Home/SNF/Other Home Patient oriented to: self, place, time, and situation Is this baseline? Yes   Triage Complete: Triage complete  Chief Complaint Osteomyelitis [facility]) [M86.9]  Triage Note Pt presents from home for abscess with possible osteomyelitis to R BKA stump. Was treated this week at [facility] ER for same, I&D performed, called back yesterday and told that they could not r/o infection of the bone on the CT scan done in ER. Instructed to return to ER.  Has been taking prescribed percocet for pain, last dose 8:30 pm   Allergies Allergies  Allergen Reactions   Ceftriaxone Itching, Nausea And Vomiting and Rash    Noted minutes after starting medication No rash noted with Ancef on 01-31-2022 during general anesthesia    Level of Care/Admitting Diagnosis ED Disposition     ED Disposition  Admit   Condition  --   Comment  Hospital Area: [facility] [[id number]]  Level of Care: Telemetry Surgical [105]  May admit patient to [facility] or [facility] if equivalent level of care is available:: No  Covid Evaluation: Asymptomatic - no recent exposure (last 10 days) testing not required  Diagnosis: Osteomyelitis [facility]) [[id number]]  Admitting Physician: Darlin Drop [[id number]]  Attending Physician: Darlin Drop [[id number]]  Certification:: I certify this patient will need inpatient services for at least 2 midnights  Estimated Length of Stay: 2          B Medical/Surgery History Past Medical History:  Diagnosis Date   Arthritis    COVID    has had Covid 3 times, not sick with it   COVID    has had 3 times mild to no symptoms   Depression    Diabetes mellitus without complication (HCC)    pt states he has now been told that he is a type 1 diabetic.   Dyspnea     albuterol inhaler   HLD (hyperlipidemia)    Hypertension    Peripheral vascular disease (HCC)    Past Surgical History:  Procedure Laterality Date   ABDOMINAL AORTOGRAM W/LOWER EXTREMITY N/A 08/11/2021   Procedure: ABDOMINAL AORTOGRAM W/LOWER EXTREMITY;  Surgeon: Leonie Douglas, MD;  Location: [facility] INVASIVE CV LAB;  Service: Cardiovascular;  Laterality: N/A;   AMPUTATION Left 11/15/2017   Procedure: LEFT FOOT 2ND RAY AMPUTATION;  Surgeon: Nadara Mustard, MD;  Location: [facility] OR;  Service: Orthopedics;  Laterality: Left;   AMPUTATION Right 08/25/2021   Procedure: RIGHT TRANSMETATARSAL AMPUTATION;  Surgeon: Nadara Mustard, MD;  Location: [facility] OR;  Service: Orthopedics;  Laterality: Right;   AMPUTATION Right 09/27/2021   Procedure: BELOW KNEE AMPUTATION;  Surgeon: Nadara Mustard, MD;  Location: [facility] OR;  Service: Orthopedics;  Laterality: Right;   APPLICATION OF WOUND VAC  12/27/2021   Procedure: APPLICATION OF WOUND VAC;  Surgeon: Nadara Mustard, MD;  Location: [facility] OR;  Service: Orthopedics;;   CERVICAL FUSION     I & D EXTREMITY Left 12/27/2021   Procedure: LEFT LEG DEBRIDEMENT ULCERS;  Surgeon: Nadara Mustard, MD;  Location: [facility] OR;  Service: Orthopedics;  Laterality: Left;   I & D EXTREMITY Left 01/31/2022   Procedure: LEFT KNEE DEBRIDEMENT AND APPLY TISSUE GRAFT;  Surgeon: Nadara Mustard, MD;  Location: [facility] OR;  Service: Orthopedics;  Laterality:  Left;   IR ANGIOGRAM EXTREMITY LEFT  11/14/2017   IR ANGIOGRAM SELECTIVE EACH ADDITIONAL VESSEL  11/14/2017   IR ANGIOGRAM SELECTIVE EACH ADDITIONAL VESSEL  11/14/2017   IR TIB-PERO ART PTA MOD SED  11/14/2017   IR TIB-PERO ART UNI PTA EA ADD VESSEL MOD SED  11/14/2017   IR US GUIDE VASC ACCESS LEFT  11/14/2017   PERIPHERAL VASCULAR INTERVENTION Right 08/11/2021   Procedure: PERIPHERAL VASCULAR INTERVENTION;  Surgeon: Leonie Douglas, MD;  Location: [facility] INVAS[facility] CV LAB;  Service: Cardiovascular;  Laterality: Right;  rt sfa stent     A IV  Location/Drains/Wounds Patient Lines/Drains/Airways Status     Active Line/Drains/Airways     Name Placement date Placement time Site Days   Peripheral IV 01/06/23 20 G Anterior;Left Forearm 01/06/23  0333  Forearm  less than 1   Negative Pressure Wound Therapy Leg Left;Medial 12/27/21  1207  --  375   Incision (Closed) 09/27/21 Leg Right 09/27/21  1009  -- 466   Incision (Closed) 12/27/21 Leg Left 12/27/21  1147  -- 375   Incision (Closed) 01/31/22 Knee 01/31/22  1155  -- 340   Wound / Incision (Open or Dehisced) 09/30/21 Diabetic ulcer Heel Left diabetic foot ulcer with granulation tissue 09/30/21  1943  Heel  463   Wound / Incision (Open or Dehisced) 09/30/21 Other (Comment) Toe (Comment  which one) Anterior;Left Necrotic Great toe, necrotic tarsals, 2nd toe amputation 09/30/21  1945  Toe (Comment  which one)  463   Wound / Incision (Open or Dehisced) 12/26/21 Other (Comment) Pretibial Left;Proximal 12/26/21  1600  Pretibial  376            Intake/Output Last 24 hours No intake or output data in the 24 hours ending 01/06/23 0415  Labs/Imaging Results for orders placed or performed during the hospital encounter of 01/06/23 (from the past 48 hour(s))  Lactic acid, plasma     Status: Abnormal   Collection Time: 01/06/23  1:26 AM  Result Value Ref Range   Lactic Acid, Venous 2.1 (HH) 0.5 - 1.9 mmol/L    Comment: CRITICAL RESULT CALLED TO, READ BACK BY AND VERIFIED WITH B. HUNUYCHTT RN, 0231, 01/06/23, EADEDOKUN Performed at [facility], [address], [place], [place] [address]   Comprehensive metabolic panel     Status: Abnormal   Collection Time: 01/06/23  1:26 AM  Result Value Ref Range   Sodium 131 (L) 135 - 145 mmol/L   Potassium 3.6 3.5 - 5.1 mmol/L   Chloride 97 (L) 98 - 111 mmol/L   CO2 24 22 - 32 mmol/L   Glucose, Bld 334 (H) 70 - 99 mg/dL    Comment: Glucose reference range applies only to samples taken after fasting for at least 8 hours.   BUN 12 8 - 23  mg/dL   Creatinine, Ser 6.04 0.61 - 1.24 mg/dL   Calcium 8.4 (L) 8.9 - 10.3 mg/dL   Total Protein 6.7 6.5 - 8.1 g/dL   Albumin 2.9 (L) 3.5 - 5.0 g/dL   AST 12 (L) 15 - 41 U/L   ALT 12 0 - 44 U/L   Alkaline Phosphatase 82 38 - 126 U/L   Total Bilirubin 0.4 0.3 - 1.2 mg/dL   GFR, Estimated >54 >09 mL/min    Comment: (NOTE) Calculated using the CKD-EPI Creatinine Equation (2021)    Anion gap 10 5 - 15    Comment: Performed at [facility], [address], [place], [place] [address]  CBC with Differential     Status: Abnormal   Collection Time: 01/06/23  1:26 AM  Result Value Ref Range   WBC 10.7 (H) 4.0 - 10.5 K/uL   RBC 4.70 4.22 - 5.81 MIL/uL   Hemoglobin 14.6 13.0 - 17.0 g/dL   HCT 19.1 47.8 - 29.5 %   MCV 92.6 80.0 - 100.0 fL   MCH 31.1 26.0 - 34.0 pg   MCHC 33.6 30.0 - 36.0 g/dL   RDW 62.1 30.8 - 65.7 %   Platelets 241 150 - 400 K/uL   nRBC 0.0 0.0 - 0.2 %   Neutrophils Relative % 59 %   Neutro Abs 6.4 1.7 - 7.7 K/uL   Lymphocytes Relative 29 %   Lymphs Abs 3.1 0.7 - 4.0 K/uL   Monocytes Relative 7 %   Monocytes Absolute 0.7 0.1 - 1.0 K/uL   Eosinophils Relative 3 %   Eosinophils Absolute 0.3 0.0 - 0.5 K/uL   Basophils Relative 1 %   Basophils Absolute 0.1 0.0 - 0.1 K/uL   Immature Granulocytes 1 %   Abs Immature Granulocytes 0.05 0.00 - 0.07 K/uL    Comment: Performed at [facility], [address] [place], [place] [address]  Lactic acid, plasma     Status: None   Collection Time: 01/06/23  3:20 AM  Result Value Ref Range   Lactic Acid, Venous 1.3 0.5 - 1.9 mmol/L    Comment: Performed at [facility], [address] [place], [place] [address]   No results found.  Pending Labs Unresulted Labs (From admission, onward)    None       Vitals/Pain Today's Vitals   01/06/23 0115 01/06/23 0118 01/06/23 0300  BP: (!) 141/80    Pulse: 94    Resp: 18    Temp: 98.1 F (36.7 C)    SpO2: 96%    Weight:   97.5 kg  Height:   6\' 4"  (1.93 m)   PainSc:  4      Isolation Precautions No active isolations  Medications Medications  vancomycin (VANCOREADY) IVPB 2000 mg/400 mL (has no administration in time range)  lactated ringers bolus 1,000 mL (1,000 mLs Intravenous New Bag/Given 01/06/23 0349)  acetaminophen (TYLENOL) tablet 1,000 mg (1,000 mg Oral Given 01/06/23 0350)    Mobility manual wheelchair     Focused Assessments    R Recommendations: See Admitting Provider Note  Report given to:   Additional Notes:  Right BKA. Uses wheelchair to get around. Really nice man.

## 2023-01-06 NOTE — Plan of Care (Signed)
  Problem: Coping: Goal: Ability to adjust to condition or change in health will improve Outcome: Progressing   

## 2023-01-06 NOTE — Plan of Care (Signed)
Problem: Education: Goal: Ability to describe self-care measures that may prevent or decrease complications (Diabetes Survival Skills Education) will improve 01/06/2023 1250 by Weyman Pedro, RN Outcome: Progressing 01/06/2023 1249 by Weyman Pedro, RN Outcome: Progressing   Problem: Coping: Goal: Ability to adjust to condition or change in health will improve 01/06/2023 1250 by Weyman Pedro, RN Outcome: Progressing 01/06/2023 1249 by Weyman Pedro, RN Outcome: Progressing   Problem: Fluid Volume: Goal: Ability to maintain a balanced intake and output will improve 01/06/2023 1250 by Weyman Pedro, RN Outcome: Progressing 01/06/2023 1249 by Weyman Pedro, RN Outcome: Progressing   Problem: Health Behavior/Discharge Planning: Goal: Ability to identify and utilize available resources and services will improve 01/06/2023 1250 by Weyman Pedro, RN Outcome: Progressing 01/06/2023 1249 by Weyman Pedro, RN Outcome: Progressing Goal: Ability to manage health-related needs will improve 01/06/2023 1250 by Weyman Pedro, RN Outcome: Progressing 01/06/2023 1249 by Weyman Pedro, RN Outcome: Progressing   Problem: Metabolic: Goal: Ability to maintain appropriate glucose levels will improve 01/06/2023 1250 by Weyman Pedro, RN Outcome: Progressing 01/06/2023 1249 by Weyman Pedro, RN Outcome: Progressing   Problem: Nutritional: Goal: Maintenance of adequate nutrition will improve 01/06/2023 1250 by Weyman Pedro, RN Outcome: Progressing 01/06/2023 1249 by Weyman Pedro, RN Outcome: Progressing Goal: Progress toward achieving an optimal weight will improve 01/06/2023 1250 by Weyman Pedro, RN Outcome: Progressing 01/06/2023 1249 by Weyman Pedro, RN Outcome: Progressing   Problem: Tissue Perfusion: Goal: Adequacy of tissue perfusion will improve 01/06/2023 1250 by Weyman Pedro, RN Outcome:  Progressing 01/06/2023 1249 by Weyman Pedro, RN Outcome: Progressing   Problem: Education: Goal: Knowledge of General Education information will improve Description: Including pain rating scale, medication(s)/side effects and non-pharmacologic comfort measures 01/06/2023 1250 by Weyman Pedro, RN Outcome: Progressing 01/06/2023 1249 by Weyman Pedro, RN Outcome: Progressing   Problem: Health Behavior/Discharge Planning: Goal: Ability to manage health-related needs will improve 01/06/2023 1250 by Weyman Pedro, RN Outcome: Progressing 01/06/2023 1249 by Weyman Pedro, RN Outcome: Progressing   Problem: Clinical Measurements: Goal: Ability to maintain clinical measurements within normal limits will improve 01/06/2023 1250 by Weyman Pedro, RN Outcome: Progressing 01/06/2023 1249 by Weyman Pedro, RN Outcome: Progressing Goal: Will remain free from infection 01/06/2023 1250 by Weyman Pedro, RN Outcome: Progressing 01/06/2023 1249 by Weyman Pedro, RN Outcome: Progressing Goal: Diagnostic test results will improve 01/06/2023 1250 by Weyman Pedro, RN Outcome: Progressing 01/06/2023 1249 by Weyman Pedro, RN Outcome: Progressing Goal: Respiratory complications will improve 01/06/2023 1250 by Weyman Pedro, RN Outcome: Progressing 01/06/2023 1249 by Weyman Pedro, RN Outcome: Progressing Goal: Cardiovascular complication will be avoided 01/06/2023 1250 by Weyman Pedro, RN Outcome: Progressing 01/06/2023 1249 by Weyman Pedro, RN Outcome: Progressing   Problem: Activity: Goal: Risk for activity intolerance will decrease 01/06/2023 1250 by Weyman Pedro, RN Outcome: Progressing 01/06/2023 1249 by Weyman Pedro, RN Outcome: Progressing   Problem: Nutrition: Goal: Adequate nutrition will be maintained 01/06/2023 1250 by Weyman Pedro, RN Outcome: Progressing 01/06/2023 1249 by Weyman Pedro,  RN Outcome: Progressing   Problem: Coping: Goal: Level of anxiety will decrease 01/06/2023 1250 by Weyman Pedro, RN Outcome: Progressing 01/06/2023 1249 by Weyman Pedro, RN Outcome: Progressing   Problem: Elimination: Goal: Will not experience complications related to bowel motility 01/06/2023 1250 by Weyman Pedro, RN Outcome: Progressing 01/06/2023 1249 by Laural Benes,  Modena Nunnery, RN Outcome: Progressing Goal: Will not experience complications related to urinary retention 01/06/2023 1250 by Weyman Pedro, RN Outcome: Progressing 01/06/2023 1249 by Weyman Pedro, RN Outcome: Progressing   Problem: Pain Managment: Goal: General experience of comfort will improve 01/06/2023 1250 by Weyman Pedro, RN Outcome: Progressing 01/06/2023 1249 by Weyman Pedro, RN Outcome: Progressing   Problem: Safety: Goal: Ability to remain free from injury will improve 01/06/2023 1250 by Weyman Pedro, RN Outcome: Progressing 01/06/2023 1249 by Weyman Pedro, RN Outcome: Progressing   Problem: Skin Integrity: Goal: Risk for impaired skin integrity will decrease 01/06/2023 1250 by Weyman Pedro, RN Outcome: Progressing 01/06/2023 1249 by Weyman Pedro, RN Outcome: Progressing

## 2023-01-06 NOTE — ED Triage Notes (Signed)
Pt presents from home for abscess with possible osteomyelitis to R BKA stump. Was treated this week at [facility] ER for same, I&D performed, called back yesterday and told that they could not r/o infection of the bone on the CT scan done in ER. Instructed to return to ER.  Has been taking prescribed percocet for pain, last dose 8:30 pm

## 2023-01-06 NOTE — H&P (Addendum)
History and Physical  HERBET WIELENGA Pena:[id number] DOB: 07/10/1961 DOA: 01/06/2023  Referring physician: Solon Augusta, PA-EDP  PCP: Pcp, No  Outpatient Specialists: Orthopedic surgery Patient coming from: Home  Chief Complaint: R BKA osteomyelitis   HPI: Cody Pena is a 62 y.o. 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    The patient initially presented at outside facility ER where he had a CT of his right stump on 01/04/2023.  He was discharged home with a prescription for po clindamycin which he has been taking.  He received a call today regarding the abnormal findings on CT scan and was advised to return to the ED for further management.  CT RLE showed findings suggestive of an intraosseous abscess and associated osteomyelitis involving the right stump.  He was instructed to return to the ER at outside facility and opted to come to [facility] ED.  In the ED, vital signs are stable.  The patient was started on IV vancomycin empirically and received 1 L IV fluid bolus 250 cc/h x 1.  TR[facility], hospitalist service, was asked to admit.  ED Course: Temperature 98.1.  BP 141/80, pulse 94, respiratory 18, O2 saturation 96% on room air.  Lab studies remarkable for serum sodium 131, glucose 334, albumin 2.9, WBC 10.7.  Review of Systems: Review of systems as noted in the HPI. All other systems reviewed and are negative.   Past Medical History:  Diagnosis Date   Arthritis    COVID    has had Covid 3 times, not sick with it   COVID    has had 3 times mild to no symptoms   Depression    Diabetes mellitus without complication (HCC)    pt states he has now  been told that he is a type 1 diabetic.   Dyspnea    albuterol inhaler   HLD (hyperlipidemia)    Hypertension    Peripheral vascular disease (HCC)    Past Surgical History:  Procedure Laterality Date   ABDOMINAL AORTOGRAM W/LOWER EXTREMITY N/A 08/11/2021   Procedure: ABDOMINAL AORTOGRAM W/LOWER EXTREMITY;  Surgeon: Leonie Douglas, MD;  Location: [facility] INV[facility]IVE [facility];  Service: Cardiovascular;  Laterality: N/A;   AMPUTATION Left 11/15/2017   Procedure: LEFT FOOT 2ND RAY AMPUTATION;  Surgeon: Nadara Mustard, MD;  Location: [facility] OR;  Service: Orthopedics;  Laterality: Left;   AMPUTATION Right 08/25/2021   Procedure: RIGHT TRANSMETATARSAL AMPUTATION;  Surgeon: Nadara Mustard, MD;  Location: [facility] OR;  Service: Orthopedics;  Laterality: Right;   AMPUTATION Right 09/27/2021   Procedure: BELOW KNEE AMPUTATION;  Surgeon: Nadara Mustard, MD;  Location: [facility] OR;  Service: Orthopedics;  Laterality: Right;   APPLICATION OF WOUND VAC  12/27/2021   Procedure: APPLICATION OF WOUND VAC;  Surgeon: Nadara Mustard, MD;  Location: [facility] OR;  Service: Orthopedics;;   CERVICAL FUSION     I & D EXTREMITY Left 12/27/2021   Procedure: LEFT LEG DEBRIDEMENT ULCERS;  Surgeon: Nadara Mustard, MD;  Location: [facility] OR;  Service: Orthopedics;  Laterality: Left;   I & D EXTREMITY Left 01/31/2022   Procedure: LEFT KNEE DEBRIDEMENT AND APPLY TISSUE GRAFT;  Surgeon: Nadara Mustard, MD;  Location:  [facility] OR;  Service: Orthopedics;  Laterality: Left;   IR ANGIOGRAM EXTREMITY LEFT  11/14/2017   IR ANGIOGRAM SELECTIVE EACH ADDITIONAL VESSEL  11/14/2017   IR ANGIOGRAM SELECTIVE EACH ADDITIONAL VESSEL  11/14/2017   IR TIB-PERO ART PTA MOD SED  11/14/2017   IR TIB-PERO ART UNI PTA EA ADD VESSEL MOD SED  11/14/2017   IR US GUIDE VASC ACCESS LEFT  11/14/2017   PERIPHERAL VASCULAR INTERVENTION Right 08/11/2021   Procedure: PERIPHERAL VASCULAR INTERVENTION;  Surgeon: Leonie Douglas, MD;  Location: [facility];  Service: Cardiovascular;  Laterality: Right;   rt sfa stent    Social History:  reports that he has been smoking cigarettes. He has a 23.50 pack-year smoking history. He has never used smokeless tobacco. He reports that he does not drink alcohol and does not use drugs.   Allergies  Allergen Reactions   Ceftriaxone Itching, Nausea And Vomiting and Rash    Noted minutes after starting medication No rash noted with Ancef on 01-31-2022 during general anesthesia    History reviewed. No pertinent family history.    Prior to Admission medications   Medication Sig Start Date End Date Taking? Authorizing Provider  acetaminophen (TYLENOL) 325 MG tablet Take 1-2 tablets (325-650 mg total) by mouth every 4 (four) hours as needed for mild pain. 10/05/21   Angiulli, Mcarthur Rossetti, PA-C  albuterol (VENTOLIN HFA) 108 (90 Base) MCG/ACT inhaler Inhale 2 puffs into the lungs every 6 (six) hours as needed for shortness of breath. 10/05/21   Angiulli, Mcarthur Rossetti, PA-C  atorvastatin (LIPITOR) 80 MG tablet Take 1 tablet (80 mg total) by mouth daily. Patient taking differently: Take 80 mg by mouth at bedtime. 10/05/21   Angiulli, Mcarthur Rossetti, PA-C  citalopram (CELEXA) 40 MG tablet Take 40 mg by mouth at bedtime. 09/26/21   [provider]  clonazePAM (KLONOPIN) 0.5 MG tablet Take 1 tablet (0.5 mg total) by mouth 2 (two) times daily. 10/05/21   Angiulli, Mcarthur Rossetti, PA-C  clopidogrel (PLAVIX) 75 MG tablet Take 75 mg by mouth at bedtime. 12/07/21   [provider]  docusate sodium (COLACE) 100 MG capsule Take 1 capsule (100 mg total) by mouth 2 (two) times daily. 12/29/21   Marguerita Merles Latif, DO  feeding supplement (ENSURE ENLIVE / ENSURE PLUS) LIQD Take 237 mLs by mouth 2 (two) times daily with a meal. 12/29/21   Sheikh, Omair Latif, DO  gabapentin (NEURONTIN) 600 MG tablet Take 1 tablet (600 mg total) by mouth 3 (three) times daily. 10/05/21   Angiulli, Mcarthur Rossetti, PA-C  HUMALOG KWIKPEN 100 UNIT/ML KwikPen Inject 0-12 Units into the skin 3 (three) times daily.  12/07/21   [provider]  LANTUS 100 UNIT/ML injection Inject 24 Units into the skin at bedtime. 12/07/21   [provider]  leptospermum manuka honey (MEDIHONEY) PSTE paste Apply 1 application. topically daily. 12/29/21   Marguerita Merles Latif, DO  metFORMIN (GLUCOPHAGE) 500 MG tablet Take 1 tablet (500 mg total) by mouth 2 (two) times daily with a meal. 10/05/21 10/05/22  Angiulli, Mcarthur Rossetti, PA-C  methocarbamol (ROBAXIN) 500 MG tablet Take 1 tablet (500 mg total) by mouth every 6 (six) hours as needed for muscle spasms. 10/05/21   Angiulli, Mcarthur Rossetti, PA-C  Multiple Vitamin (MULTIVITAMIN WITH MINERALS) TABS tablet Take 1 tablet by mouth daily. 11/17/17   Darlin Drop, DO  nicotine (NICODERM CQ - DOSED IN MG/24 HOURS) 21 mg/24hr patch 21 mg patch daily x2 weeks then  14 mg patch daily x3 weeks then 7 mg patch daily x3 weeks and stop 10/05/21   Angiulli, Mcarthur Rossetti, PA-C  nutrition supplement, JUVEN, (JUVEN) PACK Take 1 packet by mouth 2 (two) times daily between meals. 12/29/21   Sheikh, Omair Latif, DO  ondansetron (ZOFRAN) 4 MG tablet Take 1 tablet (4 mg total) by mouth every 6 (six) hours as needed for nausea. 12/29/21   Marguerita Merles Latif, DO  oxyCODONE-acetaminophen (PERCOCET) 10-325 MG tablet Take 1 tablet by mouth every 6 (six) hours as needed for pain. 02/14/22   Adonis Huguenin, NP  polyethylene glycol powder (GLYCOLAX/MIRALAX) 17 GM/SCOOP powder Take 1 capful (17 g) by mouth daily as needed for mild constipation. 12/29/21   Marguerita Merles Latif, DO  sulfamethoxazole-trimethoprim (BACTRIM DS) 800-160 MG tablet Take 1 tablet by mouth 2 (two) times daily. 02/06/22   Adonis Huguenin, NP  Vitamin D, Ergocalciferol, (DRISDOL) 1.25 MG (50000 UNIT) CAPS capsule Take 1 capsule (50,000 Units total) by mouth every 7 (seven) days. Patient taking differently: Take 50,000 Units by mouth every Wednesday. 07/26/21   Ghimire, Werner Lean, MD    Physical Exam: BP (!) 141/80 (BP Location: Right Arm)   Pulse  94   Temp 98.1 F (36.7 C)   Resp 18   Ht 6\' 4"  (1.93 m)   Wt 97.5 kg   SpO2 96%   BMI 26.16 kg/m   General: 62 y.o. year-old male well developed well nourished in no acute distress.  Alert and oriented x3. Cardiovascular: Regular rate and rhythm with no rubs or gallops.  No thyromegaly or JVD noted.  No lower extremity edema. 2/4 pulses in all 4 extremities. Respiratory: Clear to auscultation with no wheezes or rales. Good inspiratory effort. Abdomen: Soft nontender nondistended with normal bowel sounds x4 quadrants. Muskuloskeletal: No cyanosis or clubbing noted bilaterally.  R BKA with erythema, edema warmth and tenderness at the stump. Neuro: CN II-XII intact, strength, sensation, reflexes Skin: Right stump with mild erythema. Psychiatry: Judgement and insight appear normal. Mood is appropriate for condition and setting          Labs on Admission:  Basic Metabolic Panel: Recent Labs  Lab 01/06/23 0126  NA 131*  K 3.6  CL 97*  CO2 24  GLUCOSE 334*  BUN 12  CREATININE 0.93  CALCIUM 8.4*   Liver Function Tests: Recent Labs  Lab 01/06/23 0126  AST 12*  ALT 12  ALKPHOS 82  BILITOT 0.4  PROT 6.7  ALBUMIN 2.9*   No results for input(s): "LIPASE", "AMYLASE" in the last 168 hours. No results for input(s): "AMMONIA" in the last 168 hours. CBC: Recent Labs  Lab 01/06/23 0126  WBC 10.7*  NEUTROABS 6.4  HGB 14.6  HCT 43.5  MCV 92.6  PLT 241   Cardiac Enzymes: No results for input(s): "CKTOTAL", "CKMB", "CKMBINDEX", "TROPONINI" in the last 168 hours.  BNP (last 3 results) No results for input(s): "BNP" in the last 8760 hours.  ProBNP (last 3 results) No results for input(s): "PROBNP" in the last 8760 hours.  CBG: No results for input(s): "GLUCAP" in the last 168 hours.  Radiological Exams on Admission: No results found.  EKG: I independently viewed the EKG done and my findings are as followed: None available at the time of this  visit.  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 Lajoyce Corners consulted. Analgesics as needed with bowel regimen as needed Gentle IV fluid hydration while on IV vancomycin, NS at 50 cc/h x 2 days  Type 2 diabetes with hyperglycemia Presented with serum glucose of 334 N.p.o. until seen by orthopedic surgery. Obtain hemoglobin A1c Start insulin sliding scale Q4H while NPO.  Hypovolemic hyponatremia Serum sodium 131 IV fluid hydration NS at 50 cc/h x 2 days. Repeat BMP in the AM  Diabetic Polyneuropathy Resume home Gabapentin   Tobacco use disorder Recommend tobacco cessation Nicotine patch    DVT prophylaxis: SQ Heparin TID rather than SQ Lovenox daily due to possible orthopedic surgical intervention.  Code Status: Full code  Family Communication: None at bedside.  Disposition Plan: Admitted to telemetry surgical unit.  Consults called: Orthopedic surgery.  Admission status: Inpatient status.   Status is: Inpatient The patient requires at least 2 midnights for further evaluation and treatment of present condition.   Darlin Drop MD [facility] Pager [phone]  If 7PM-7AM, please contact night-coverage [email or web address] Password [email or web address]  01/06/2023, 4:02 AM

## 2023-01-06 NOTE — Consult Note (Signed)
ORTHOPAEDIC CONSULTATION  REQUESTING PHYSICIAN: Barnetta Chapel, MD  Chief Complaint: Cellulitis right below-knee amputation.  HPI: Cody Pena is a 62 y.o. 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  Patient has also had debridement left leg ulcers and tissue graft.  Patient has had cultures positive for MRSA in the past.  Patient presents with acute cellulitis with a small ulcer and drainage from the right below-knee amputation.  Past Medical History:  Diagnosis Date   Arthritis    COVID    has had Covid 3 times, not sick with it   COVID    has had 3 times mild to no symptoms   Depression    Diabetes mellitus without complication (HCC)    pt states he has now been told that he is a type 1 diabetic.   Dyspnea    albuterol inhaler   HLD (hyperlipidemia)    Hypertension    Peripheral vascular disease (HCC)    Past Surgical History:  Procedure Laterality Date   ABDOMINAL AORTOGRAM W/LOWER EXTREMITY N/A 08/11/2021   Procedure: ABDOMINAL AORTOGRAM W/LOWER EXTREMITY;  Surgeon: Leonie Douglas, MD;  Location: [facility];  Service: Cardiovascular;  Laterality: N/A;   AMPUTATION Left 11/15/2017   Procedure: LEFT FOOT 2ND RAY AMPUTATION;  Surgeon: Nadara Mustard, MD;  Location: [facility] OR;  Service: Orthopedics;  Laterality: Left;   AMPUTATION Right 08/25/2021   Procedure: RIGHT TRANSMETATARSAL AMPUTATION;  Surgeon: Nadara Mustard, MD;  Location: [facility] OR;  Service: Orthopedics;  Laterality: Right;   AMPUTATION Right 09/27/2021   Procedure: BELOW KNEE AMPUTATION;  Surgeon: Nadara Mustard, MD;  Location: [facility] OR;  Service: Orthopedics;  Laterality: Right;   APPLICATION OF WOUND VAC  12/27/2021   Procedure: APPLICATION OF WOUND VAC;  Surgeon: Nadara Mustard, MD;  Location: [facility] OR;  Service: Orthopedics;;   CERVICAL FUSION     I & D EXTREMITY Left 12/27/2021   Procedure: LEFT LEG DEBRIDEMENT ULCERS;  Surgeon: Nadara Mustard, MD;  Location: [facility] OR;  Service: Orthopedics;  Laterality: Left;   I & D EXTREMITY Left 01/31/2022   Procedure: LEFT KNEE DEBRIDEMENT AND APPLY TISSUE GRAFT;  Surgeon: Nadara Mustard, MD;  Location: [facility] OR;  Service: Orthopedics;  Laterality: Left;   IR ANGIOGRAM EXTREMITY LEFT  11/14/2017   IR ANGIOGRAM SELECTIVE EACH ADDITIONAL VESSEL  11/14/2017   IR ANGIOGRAM SELECTIVE EACH ADDITIONAL VESSEL  11/14/2017   IR TIB-PERO ART PTA MOD SED  11/14/2017   IR TIB-PERO ART UNI PTA EA ADD VESSEL MOD SED  11/14/2017   IR US GUIDE VASC ACCESS LEFT  11/14/2017   PERIPHERAL VASCULAR INTERVENTION Right 08/11/2021   Procedure: PERIPHERAL VASCULAR INTERVENTION;  Surgeon: Leonie Douglas, MD;  Location: [facility];  Service: Cardiovascular;  Laterality: Right;  rt sfa stent   Social History   Socioeconomic History   Marital status: Married    Spouse name: Not on file   Number of children: Not on file   Years of education: Not on file   Highest education level: Not on file  Occupational History   Not on file  Tobacco Use   Smoking status: Every Day    Packs/day: 0.50    Years: 47.00    Additional pack years: 0.00    Total pack years: 23.50    Types: Cigarettes   Smokeless tobacco: Never  Vaping Use   Vaping  Use: Some days  Substance and Sexual Activity   Alcohol use: No   Drug use: No   Sexual activity: Not on file  Other Topics Concern   Not on file  Social History Narrative   Not on file   Social Determinants of Health   Financial Resource Strain: Not on file  Food Insecurity: No Food Insecurity (01/06/2023)   Hunger Vital Sign    Worried About Running Out of Food in the Last Year: Never true    Ran Out of Food in the Last Year: Never true  Transportation Needs: No Transportation Needs (01/06/2023)   PRAPARE - Administrator, Civil Service (Medical): No    Lack of Transportation (Non-Medical): No  Physical Activity: Not on file  Stress: Not on file  Social  Connections: Not on file   History reviewed. No pertinent family history. - negative except otherwise stated in the family history section Allergies  Allergen Reactions   Ceftriaxone Itching, Nausea And Vomiting and Rash    Noted minutes after starting medication No rash noted with Ancef on 01-31-2022 during general anesthesia Has received cefepime and cefazolin without reaction   Prior to Admission medications   Medication Sig Start Date End Date Taking? Authorizing Provider  acetaminophen (TYLENOL) 325 MG tablet Take 1-2 tablets (325-650 mg total) by mouth every 4 (four) hours as needed for mild pain. 10/05/21  Yes Angiulli, Mcarthur Rossetti, PA-C  clindamycin (CLEOCIN) 300 MG capsule Take 300 mg by mouth every 6 (six) hours. 01/04/23 01/11/23 Yes [provider]  gabapentin (NEURONTIN) 600 MG tablet Take 1 tablet (600 mg total) by mouth 3 (three) times daily. 10/05/21  Yes Angiulli, Mcarthur Rossetti, PA-C   No results found. - pertinent xrays, CT, MRI studies were reviewed and independently interpreted  Positive ROS: All other systems have been reviewed and were otherwise negative with the exception of those mentioned in the HPI and as above.  Physical Exam: General: Alert, no acute distress Psychiatric: Patient is competent for consent with normal mood and affect Lymphatic: No axillary or cervical lymphadenopathy Cardiovascular: No pedal edema Respiratory: No cyanosis, no use of accessory musculature GI: No organomegaly, abdomen is soft and non-tender    Images:  @ENCIMAGES @  Labs:  Lab Results  Component Value Date   HGBA1C 9.2 (H) 09/27/2021   HGBA1C 12.5 (H) 07/23/2021   HGBA1C 7.2 (H) 11/10/2017   ESRSEDRATE 33 (H) 12/29/2021   ESRSEDRATE 22 (H) 12/26/2021   ESRSEDRATE 60 (H) 11/10/2017   CRP 4.5 (H) 12/29/2021   CRP 4.4 (H) 12/26/2021   CRP 3.2 (H) 11/10/2017   REPTSTATUS PENDING 01/06/2023   GRAMSTAIN  12/27/2021    RARE WBC PRESENT, PREDOMINANTLY PMN RARE GRAM  POSITIVE COCCI IN PAIRS    CULT  01/06/2023    NO GROWTH < 12 HOURS Performed at [facility], [address], [place], [place] [address]    LABORGA METHICILLIN RESISTANT STAPHYLOCOCCUS AUREUS 12/27/2021    Lab Results  Component Value Date   ALBUMIN 2.9 (L) 01/06/2023   ALBUMIN 2.8 (L) 12/29/2021   ALBUMIN 2.7 (L) 12/28/2021   PREALBUMIN 17.8 (L) 12/26/2021   PREALBUMIN 11.9 (L) 09/27/2021   PREALBUMIN 12.9 (L) 09/18/2021        Latest Ref Rng & Units 01/06/2023    4:53 AM 01/06/2023    1:26 AM 01/31/2022   10:36 AM  CBC EXTENDED  WBC 4.0 - 10.5 K/uL 10.2  10.7  8.5   RBC 4.22 -  5.81 MIL/uL 4.49  4.70  4.92   Hemoglobin 13.0 - 17.0 g/dL 16.1  09.6  04.5   HCT 39.0 - 52.0 % 41.8  43.5  45.7   Platelets 150 - 400 K/uL 227  241  191   NEUT# 1.7 - 7.7 K/uL  6.4    Lymph# 0.7 - 4.0 K/uL  3.1      Neurologic: Patient does not have protective sensation bilateral lower extremities.   MUSCULOSKELETAL:   Skin: Examination there is cellulitis of the residual limb on the right.  There is a small open wound with drainage.  Patient has had a CT scan and an outside facility but I am unable to pull up these results through epic.  White cell count 10.2 hemoglobin 14.2.  Albumin 2.9.  Previous tissue cultures positive for MRSA last year.  Hemoglobin A1c 1 year ago 9.2.  Assessment: Assessment: Cellulitis and possible osteomyelitis right transtibial amputation.  Plan: Plan: Will reevaluate on Tuesday to see how this resolves with his broad-spectrum antibiotics.  Discussed that if we do not have sufficient resolution of the infection will proceed with a revision of the transtibial amputation.  I will place an order for an MRI scan.  Thank you for the consult and the opportunity to see Mr. Rocci Sircy, MD [facility] [phone] 9:07 AM

## 2023-01-06 NOTE — Progress Notes (Signed)
Brief progress note: -Patient was admitted earlier today. -As per H&P done by Dr. Raphael Gibney earlier today: "Cody Pena is a 62 y.o. 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     The patient initially presented at outside facility ER where he had a CT of his right stump on 01/04/2023.  He was discharged home with a prescription for po clindamycin which he has been taking.  He received a call today regarding the abnormal findings on CT scan and was advised to return to the ED for further management.  CT RLE showed findings suggestive of an intraosseous abscess and associated osteomyelitis involving the right stump.  He was instructed to return to the ER at outside facility and opted to come to [facility] ED.   In the ED, vital signs are stable.  The patient was started on IV vancomycin empirically and received 1 L IV fluid bolus 250 cc/h x 1.  TRH, hospitalist service, was asked to admit.   ED Course: Temperature 98.1.  BP 141/80, pulse 94, respiratory 18, O2 saturation 96% on room air.  Lab studies remarkable for serum sodium 131, glucose 334, albumin 2.9, WBC 10.7".  01/06/2023: Patient seen by orthopedic team, Dr. Lajoyce Corners.  Patient will continue IV antibiotics for now.  Orthopedic team will review patient in 2 days time and advise on how to proceed.  Patient remained stable.  No new changes.  Patient reports that he has been on gabapentin 600 Mg p.o. 3 times daily.  Will adjust gabapentin dose.

## 2023-01-06 NOTE — Progress Notes (Addendum)
Pharmacy Antibiotic Note  Cody Pena is a 62 y.o. male admitted on 01/06/2023 with concern for osteomyelitis.  Pharmacy has been consulted for vancomycin and cefepime dosing.  Plan: Vancomycin 2000mg  x1 then 1500mg  IV Q12H. Goal AUC 400-550.  Expected AUC 500. Cefepime 2g IV Q8H.  Height: 6\' 4"  (193 cm) Weight: 97.5 kg (214 lb 15.2 oz) IBW/kg (Calculated) : 86.8  Temp (24hrs), Avg:98.1 F (36.7 C), Min:98.1 F (36.7 C), Max:98.1 F (36.7 C)  Recent Labs  Lab 01/06/23 0126 01/06/23 0320  WBC 10.7*  --   CREATININE 0.93  --   LATICACIDVEN 2.1* 1.3    Estimated Creatinine Clearance: 99.8 mL/min (by C-G formula based on SCr of 0.93 mg/dL).    Allergies  Allergen Reactions   Ceftriaxone Itching, Nausea And Vomiting and Rash    Noted minutes after starting medication No rash noted with Ancef on 01-31-2022 during general anesthesia Has received cefepime and cefazolin without reaction    Thank you for allowing pharmacy to be a part of this patient's care.  Vernard Gambles, PharmD, BCPS  01/06/2023 4:29 AM

## 2023-01-06 NOTE — Consult Note (Signed)
WOC Nurse Consult Note: Reason for Consult:simultaneously consulted with orthopedics and Dr. Lajoyce Corners has seen today. Small wound noted to right lower extremity. Wound type: full thickness Pressure Injury POA: N/A Measurement:To be measured by Bedside Rn and documented on Nursing flow sheet Wound ZOX:WRUE, moist Drainage (amount, consistency, odor) small serous Periwound:erythematous Dressing procedure/placement/frequency: Dr. Lajoyce Corners to reassess on Tuesday and determine if further amputation is required. In the interim, I will provide guidance for nursing for topical care using a daily cleanse, topping with xeroform gauze and covering with dry gauze secured by silicone foam.  WOC nursing team will not follow, but will remain available to this patient, the nursing and medical teams.  Please re-consult if needed.  Thank you for inviting [place] to participate in this patient's Plan of Care.  Ladona Mow, MSN, RN, CNS, GNP, Leda Min, [employer], [employer] phone:  [phone]

## 2023-01-07 DIAGNOSIS — L02415 Cutaneous abscess of right lower limb: Secondary | ICD-10-CM

## 2023-01-07 LAB — CBC WITH DIFFERENTIAL/PLATELET
Abs Immature Granulocytes: 0.04 10*3/uL (ref 0.00–0.07)
Basophils Absolute: 0.1 10*3/uL (ref 0.0–0.1)
Basophils Relative: 0 %
Eosinophils Absolute: 0.4 10*3/uL (ref 0.0–0.5)
Eosinophils Relative: 4 %
HCT: 40.2 % (ref 39.0–52.0)
Hemoglobin: 13.6 g/dL (ref 13.0–17.0)
Immature Granulocytes: 0 %
Lymphocytes Relative: 14 %
Lymphs Abs: 1.6 10*3/uL (ref 0.7–4.0)
MCH: 30.8 pg (ref 26.0–34.0)
MCHC: 33.8 g/dL (ref 30.0–36.0)
MCV: 91 fL (ref 80.0–100.0)
Monocytes Absolute: 0.9 10*3/uL (ref 0.1–1.0)
Monocytes Relative: 8 %
Neutro Abs: 8.2 10*3/uL — ABNORMAL HIGH (ref 1.7–7.7)
Neutrophils Relative %: 74 %
Platelets: 274 10*3/uL (ref 150–400)
RBC: 4.42 MIL/uL (ref 4.22–5.81)
RDW: 13 % (ref 11.5–15.5)
WBC: 11.1 10*3/uL — ABNORMAL HIGH (ref 4.0–10.5)
nRBC: 0 % (ref 0.0–0.2)

## 2023-01-07 LAB — GLUCOSE, CAPILLARY
Glucose-Capillary: 144 mg/dL — ABNORMAL HIGH (ref 70–99)
Glucose-Capillary: 178 mg/dL — ABNORMAL HIGH (ref 70–99)
Glucose-Capillary: 192 mg/dL — ABNORMAL HIGH (ref 70–99)
Glucose-Capillary: 203 mg/dL — ABNORMAL HIGH (ref 70–99)
Glucose-Capillary: 226 mg/dL — ABNORMAL HIGH (ref 70–99)
Glucose-Capillary: 238 mg/dL — ABNORMAL HIGH (ref 70–99)

## 2023-01-07 LAB — COMPREHENSIVE METABOLIC PANEL
ALT: 11 U/L (ref 0–44)
AST: 10 U/L — ABNORMAL LOW (ref 15–41)
Albumin: 2.7 g/dL — ABNORMAL LOW (ref 3.5–5.0)
Alkaline Phosphatase: 66 U/L (ref 38–126)
Anion gap: 8 (ref 5–15)
BUN: 13 mg/dL (ref 8–23)
CO2: 26 mmol/L (ref 22–32)
Calcium: 8.1 mg/dL — ABNORMAL LOW (ref 8.9–10.3)
Chloride: 100 mmol/L (ref 98–111)
Creatinine, Ser: 0.93 mg/dL (ref 0.61–1.24)
GFR, Estimated: >60 mL/min (ref >60)
Glucose, Bld: 187 mg/dL — ABNORMAL HIGH (ref 70–99)
Potassium: 3.7 mmol/L (ref 3.5–5.1)
Sodium: 134 mmol/L — ABNORMAL LOW (ref 135–145)
Total Bilirubin: 0.4 mg/dL (ref 0.3–1.2)
Total Protein: 6.4 g/dL — ABNORMAL LOW (ref 6.5–8.1)

## 2023-01-07 LAB — HIV ANTIBODY (ROUTINE TESTING W REFLEX): HIV Screen 4th Generation wRfx: NONREACTIVE

## 2023-01-07 LAB — MAGNESIUM: Magnesium: 1.9 mg/dL (ref 1.7–2.4)

## 2023-01-07 MED ORDER — GLUCERNA SHAKE PO LIQD
237.0000 mL | Freq: Two times a day (BID) | ORAL | Status: DC
  Administered 2023-01-07 – 2023-01-12 (×8): 237 mL via ORAL

## 2023-01-07 MED ORDER — ADULT MULTIVITAMIN W/MINERALS CH
1.0000 | ORAL_TABLET | Freq: Every day | ORAL | Status: DC
  Administered 2023-01-07 – 2023-01-12 (×5): 1 via ORAL
  Filled 2023-01-07 (×5): qty 1

## 2023-01-07 NOTE — Progress Notes (Signed)
PROGRESS NOTE    Cody Pena  ZOX:[id number] DOB: 1960/11/10 DOA: 01/06/2023 PCP: Pcp, No  Outpatient Specialists:     Brief Narrative:  Patient is a 62 year old male past medical history significant for type 2 diabetes mellitus, hypertension, hyperlipidemia and s/p right BKA secondary to osteomyelitis.  Patient was admitted with abscess and associated osteomyelitis of right BKA stump.  Patient is on antibiotics.  Orthopedic team has been consulted.  Orthopedic team will reassess patient tomorrow and advise further.  Patient is slowly improving.  No new complaints today.   Assessment & Plan:   Active Problems:   Osteomyelitis (HCC)   Abscess of right lower leg  Right stump intraosseous abscess with associated osteomyelitis, POA -Prior to presentation, the patient was started on po antibiotics outpatient on 01/04/23, therefore antibiotics coverage were continued in the ED -Peripheral blood cultures x 2.   -Patient is currently on IV vancomycin, cefepime and Flagyl. -Orthopedic team has seen patient (Dr. Lajoyce Corners).  Orthopedic team will review patient tomorrow and advise further.   -Patient is slowly improving.     Type 2 diabetes with hyperglycemia -Presented with serum glucose of 334 -Continue to optimize.  Continue sliding scale insulin coverage.     Hyponatremia: -Slowly improving. -Sodium is 134 today.     Diabetic Polyneuropathy Continue gabapentin.      Tobacco use disorder Counseled Nicotine patch   DVT prophylaxis: Subcutaneous heparin Code Status: Full code Family Communication:  Disposition Plan: This will depend on hospital course   Consultants:  Orthopedics.  Procedures:  None.  Antimicrobials:  IV vancomycin. IV cefepime. IV fluids.   Subjective: Stump infection is slowly improving. No fever or chills. No chest pain. No shortness of breath.  Objective: Vitals:   01/06/23 1949 01/07/23 0407 01/07/23 0727 01/07/23 1602  BP: 128/60 121/62  118/71 116/79  Pulse: 93 (!) 55 82 86  Resp: 18 16 16 16   Temp: 99.2 F (37.3 C) (!) 97.4 F (36.3 C) 98.7 F (37.1 C) 97.6 F (36.4 C)  TempSrc: Oral Oral Oral Oral  SpO2: 95% 93% 96% 96%  Weight:      Height:        Intake/Output Summary (Last 24 hours) at 01/07/2023 1825 Last data filed at 01/07/2023 1220 Gross per 24 hour  Intake 840 ml  Output 2000 ml  Net -1160 ml   Filed Weights   01/06/23 0300  Weight: 97.5 kg    Examination:  General exam: Appears calm and comfortable  Respiratory system: Clear to auscultation.  Cardiovascular system: S1 & S2 heard Gastrointestinal system: Abdomen is soft and nontender.   Central nervous system: Awake and alert. Extremities: Status post BKA with infected stump.    Data Reviewed: I have personally reviewed following labs and imaging studies  CBC: Recent Labs  Lab 01/06/23 0126 01/06/23 0453 01/07/23 0114  WBC 10.7* 10.2 11.1*  NEUTROABS 6.4  --  8.2*  HGB 14.6 14.2 13.6  HCT 43.5 41.8 40.2  MCV 92.6 93.1 91.0  PLT 241 227 274   Basic Metabolic Panel: Recent Labs  Lab 01/06/23 0126 01/06/23 0453 01/07/23 0114  NA 131* 130* 134*  K 3.6 3.8 3.7  CL 97* 98 100  CO2 24 25 26   GLUCOSE 334* 309* 187*  BUN 12 13 13   CREATININE 0.93 0.85 0.93  CALCIUM 8.4* 8.1* 8.1*  MG  --  1.9 1.9  PHOS  --  3.4  --    GFR: Estimated Creatinine Clearance: 99.8 mL/min (by  C-G formula based on SCr of 0.93 mg/dL). Liver Function Tests: Recent Labs  Lab 01/06/23 0126 01/07/23 0114  AST 12* 10*  ALT 12 11  ALKPHOS 82 66  BILITOT 0.4 0.4  PROT 6.7 6.4*  ALBUMIN 2.9* 2.7*   No results for input(s): "LIPASE", "AMYLASE" in the last 168 hours. No results for input(s): "AMMONIA" in the last 168 hours. Coagulation Profile: No results for input(s): "INR", "PROTIME" in the last 168 hours. Cardiac Enzymes: No results for input(s): "CKTOTAL", "CKMB", "CKMBINDEX", "TROPONINI" in the last 168 hours. BNP (last 3 results) No  results for input(s): "PROBNP" in the last 8760 hours. HbA1C: No results for input(s): "HGBA1C" in the last 72 hours. CBG: Recent Labs  Lab 01/06/23 2313 01/07/23 0405 01/07/23 0808 01/07/23 1206 01/07/23 1619  GLUCAP 186* 192* 203* 238* 144*   Lipid Profile: No results for input(s): "CHOL", "HDL", "LDLCALC", "TRIG", "CHOLHDL", "LDLDIRECT" in the last 72 hours. Thyroid Function Tests: No results for input(s): "TSH", "T4TOTAL", "FREET4", "T3FREE", "THYROIDAB" in the last 72 hours. Anemia Panel: No results for input(s): "VITAMINB12", "FOLATE", "FERRITIN", "TIBC", "IRON", "RETICCTPCT" in the last 72 hours. Urine analysis:    Component Value Date/Time   COLORURINE YELLOW 12/25/2021 2240   APPEARANCEUR CLEAR 12/25/2021 2240   LABSPEC 1.015 12/25/2021 2240   PHURINE 6.0 12/25/2021 2240   GLUCOSEU >=500 (A) 12/25/2021 2240   HGBUR NEGATIVE 12/25/2021 2240   BILIRUBINUR NEGATIVE 12/25/2021 2240   KETONESUR NEGATIVE 12/25/2021 2240   PROTEINUR NEGATIVE 12/25/2021 2240   UROBILINOGEN 1.0 12/07/2014 0325   NITRITE NEGATIVE 12/25/2021 2240   LEUKOCYTESUR NEGATIVE 12/25/2021 2240   Sepsis Labs: @LABRCNTIP (procalcitonin:4,lacticidven:4)  ) Recent Results (from the past 240 hour(s))  Culture, blood (Routine X 2) w Reflex to ID Panel     Status: None (Preliminary result)   Collection Time: 01/06/23  4:52 AM   Specimen: BLOOD RIGHT FOREARM  Result Value Ref Range Status   Specimen Description BLOOD RIGHT FOREARM  Final   Special Requests   Final    BOTTLES DRAWN AEROBIC AND ANAEROBIC Blood Culture adequate volume   Culture   Final    NO GROWTH 1 DAY Performed at [facility], [address], [place], [place] [address]    Report Status PENDING  Incomplete  Culture, blood (Routine X 2) w Reflex to ID Panel     Status: None (Preliminary result)   Collection Time: 01/06/23  4:58 AM   Specimen: BLOOD  Result Value Ref Range Status   Specimen Description BLOOD RIGHT ANTECUBITAL   Final   Special Requests   Final    BOTTLES DRAWN AEROBIC AND ANAEROBIC Blood Culture adequate volume   Culture   Final    NO GROWTH 1 DAY Performed at [facility], [address], [place], [place] [address]    Report Status PENDING  Incomplete  MRSA Next Gen by PCR, Nasal     Status: Abnormal   Collection Time: 01/06/23  7:35 AM   Specimen: Nasal Mucosa; Nasal Swab  Result Value Ref Range Status   MRSA by PCR Next Gen DETECTED (A) NOT DETECTED Final    Comment: RESULT CALLED TO, READ BACK BY AND VERIFIED WITH: 01/06/23 AT 1010 TO RN Adin Hector, ADC (NOTE) The GeneXpert MRSA Assay (FDA approved for NASAL specimens only), is one component of a comprehensive MRSA colonization surveillance program. It is not intended to diagnose MRSA infection nor to guide or monitor treatment for MRSA infections. Test performance is not  FDA approved in patients less than 7 years old. Performed at [facility], [address] [place], [place] [address]          Radiology Studies: MR TIBIA FIBULA RIGHT WO CONTRAST  Result Date: 01/07/2023 CLINICAL DATA:  Right below-knee amputation stump infection. Outside facility CT suspicious for osteomyelitis with intraosseous abscess. EXAM: MRI OF LOWER RIGHT EXTREMITY WITHOUT CONTRAST TECHNIQUE: Multiplanar, multisequence MR imaging of the right lower leg was performed. No intravenous contrast was administered. COMPARISON:  Right tibia and fibula x-rays dated January 17, 2022. FINDINGS: Bones/Joint/Cartilage Prior right below-knee amputation. Abnormal marrow edema and periosteal reaction involving the distal tibia amputation stump with 1.2 x 0.8 x 1.3 cm intraosseous fluid collection, consistent with osteomyelitis and abscess. No fracture or dislocation. Tricompartmental osteoarthritis of the right knee. No joint effusion. Muscles and Tendons Postsurgical changes with denervation and mild atrophy of the proximal lower leg muscles. Soft tissue Diffuse  soft tissue swelling of the amputation stump. 1.2 x 3.0 x 1.9 cm fluid collection along the anterior aspect of the tibial stump. 3.6 x 2.4 x 2.1 cm fluid collection along the posterior aspect of the tibial stump. Presumably these communicate (series 8, image 44). IMPRESSION: 1. Prior right below-knee amputation with osteomyelitis of the distal tibia amputation stump and 1.3 cm intraosseous abscess. 2. Soft tissue abscess adjacent to the tibial stump with anterior and posterior components. Electronically Signed   By: Obie Dredge M.D.   On: 01/07/2023 09:12        Scheduled Meds:  feeding supplement (GLUCERNA SHAKE)  237 mL Oral BID BM   gabapentin  600 mg Oral TID   heparin injection (subcutaneous)  5,000 Units Subcutaneous Q8H   insulin aspart  0-9 Units Subcutaneous Q4H   multivitamin with minerals  1 tablet Oral Daily   mupirocin ointment   Nasal BID   nicotine  14 mg Transdermal Daily   Continuous Infusions:  sodium chloride 50 mL/hr at 01/07/23 0744   ceFEPime (MAXIPIME) IV 2 g (01/07/23 1213)   metronidazole 500 mg (01/07/23 1619)   vancomycin 1,500 mg (01/07/23 1624)     LOS: 1 day    Time spent: 35 minutes.    Berton Mount, MD  [facility] Pager #: [phone] 7PM-7AM contact night coverage as above

## 2023-01-07 NOTE — Progress Notes (Signed)
Patient back from MRI,monitor placed on the patient and Central monitor informed,will continue to monitor.

## 2023-01-07 NOTE — Plan of Care (Signed)
  Problem: Metabolic: Goal: Ability to maintain appropriate glucose levels will improve Outcome: Progressing   Problem: Nutritional: Goal: Maintenance of adequate nutrition will improve Outcome: Progressing   Problem: Clinical Measurements: Goal: Will remain free from infection Outcome: Progressing   Problem: Nutrition: Goal: Adequate nutrition will be maintained Outcome: Progressing   Problem: Coping: Goal: Level of anxiety will decrease Outcome: Progressing   Problem: Pain Managment: Goal: General experience of comfort will improve Outcome: Progressing

## 2023-01-07 NOTE — Progress Notes (Addendum)
Initial Nutrition Assessment  DOCUMENTATION CODES:   Not applicable  INTERVENTION:  - Add Glucerna Shake po BID, each supplement provides 220 kcal and 10 grams of protein  - Add MVI q day.   NUTRITION DIAGNOSIS:   Increased nutrient needs related to acute illness as evidenced by estimated needs.  GOAL:   Patient will meet greater than or equal to 90% of their needs   MONITOR:   PO intake  REASON FOR ASSESSMENT:   Malnutrition Screening Tool    ASSESSMENT:   62 y.o. male admits related to R BKA osteomyelitis. PMH includes: arthritis, DM, HLD, HTN, PVD. Pt is currently receiving medical management related to right stump intraosseous abscess with associated osteomyelitis.  Meds reviewed: sliding scale insulin. Labs reviewed: Na low.   The pt reports that he was not eating well PTA. He reports that he did not have any appetite but denies any nausea or vomiting. No significant wt loss per record. Pt reports that he has been eating well since admission. RD will add Glucerna BID. Will continue to monitor PO intakes.   NUTRITION - FOCUSED PHYSICAL EXAM:  WDL - no wasting noted.  Diet Order:   Diet Order             Diet Carb Modified Fluid consistency: Thin; Room service appropriate? Yes  Diet effective now                   EDUCATION NEEDS:   Not appropriate for education at this time  Skin:  Skin Assessment: Skin Integrity Issues: Skin Integrity Issues:: Incisions Incisions: right stump  Last BM:  5/25  Height:   Ht Readings from Last 1 Encounters:  01/06/23 6\' 4"  (1.93 m)    Weight:   Wt Readings from Last 1 Encounters:  01/06/23 97.5 kg    Ideal Body Weight:     BMI:  Body mass index is 26.16 kg/m.  Estimated Nutritional Needs:   Kcal:  2400-2900 kcals  Protein:  120-145 gm  Fluid:  >/= 2.4 L  Bethann Humble, RD, LDN, CNSC.

## 2023-01-07 NOTE — TOC CM/SW Note (Addendum)
Transition of Care [facility]) - Inpatient Brief Assessment   Patient Details  Name: Cody Pena MRN: [id number] Date of Birth: 06-30-1961  Transition of Care [facility]) CM/SW Contact:    Mearl Latin, LCSW Phone Number: 01/07/2023, 9:47 AM   Clinical Narrative: Patient from home with spouse, no PCP and no insurance. CSW emailed financial counseling to screen for Medicaid application. Will follow for PCP needs.    CSW spoke with patient regarding the above. He is requesting assistance with applying for Medicaid and a PCP. Patient reported he is retired and his income is not much. CSW confirmed address on Facesheet.   Transition of Care Asessment: Insurance and Status: Selfpay Patient has primary care physician: No Home environment has been reviewed: From home Prior level of function:: Independent Prior/Current Home Services: No current home services Social Determinants of Health Reivew: SDOH reviewed no interventions necessary Readmission risk has been reviewed: Yes Transition of care needs: TOC to continue to follow-no PCP

## 2023-01-08 DIAGNOSIS — I10 Essential (primary) hypertension: Secondary | ICD-10-CM | POA: Diagnosis not present

## 2023-01-08 DIAGNOSIS — E785 Hyperlipidemia, unspecified: Secondary | ICD-10-CM | POA: Diagnosis not present

## 2023-01-08 DIAGNOSIS — E11621 Type 2 diabetes mellitus with foot ulcer: Secondary | ICD-10-CM

## 2023-01-08 DIAGNOSIS — M869 Osteomyelitis, unspecified: Secondary | ICD-10-CM | POA: Diagnosis not present

## 2023-01-08 DIAGNOSIS — L97509 Non-pressure chronic ulcer of other part of unspecified foot with unspecified severity: Secondary | ICD-10-CM

## 2023-01-08 LAB — GLUCOSE, CAPILLARY
Glucose-Capillary: 259 mg/dL — ABNORMAL HIGH (ref 70–99)
Glucose-Capillary: 171 mg/dL — ABNORMAL HIGH (ref 70–99)
Glucose-Capillary: 201 mg/dL — ABNORMAL HIGH (ref 70–99)
Glucose-Capillary: 182 mg/dL — ABNORMAL HIGH (ref 70–99)
Glucose-Capillary: 225 mg/dL — ABNORMAL HIGH (ref 70–99)

## 2023-01-08 LAB — SURGICAL PCR SCREEN
MRSA, PCR: POSITIVE — AB
Staphylococcus aureus: POSITIVE — AB

## 2023-01-08 LAB — PREALBUMIN: Prealbumin: 18 mg/dL (ref 18–38)

## 2023-01-08 LAB — HEMOGLOBIN A1C
Hgb A1c MFr Bld: 10.8 % — ABNORMAL HIGH (ref 4.8–5.6)
Mean Plasma Glucose: 263 mg/dL

## 2023-01-08 MED ORDER — ENOXAPARIN SODIUM 40 MG/0.4ML IJ SOSY
40.0000 mg | PREFILLED_SYRINGE | INTRAMUSCULAR | Status: DC
  Administered 2023-01-09 – 2023-01-11 (×3): 40 mg via SUBCUTANEOUS
  Filled 2023-01-08 (×3): qty 0.4

## 2023-01-08 MED ORDER — PANTOPRAZOLE SODIUM 40 MG PO TBEC
40.0000 mg | DELAYED_RELEASE_TABLET | Freq: Every day | ORAL | Status: DC
  Administered 2023-01-08: 40 mg via ORAL
  Filled 2023-01-08: qty 1

## 2023-01-08 MED ORDER — POVIDONE-IODINE 10 % EX SWAB
2.0000 | Freq: Once | CUTANEOUS | Status: AC
  Administered 2023-01-09: 2 via TOPICAL

## 2023-01-08 MED ORDER — ACETAMINOPHEN 325 MG PO TABS
650.0000 mg | ORAL_TABLET | Freq: Four times a day (QID) | ORAL | Status: DC
  Administered 2023-01-08 – 2023-01-09 (×2): 650 mg via ORAL
  Filled 2023-01-08 (×2): qty 2

## 2023-01-08 MED ORDER — ENOXAPARIN SODIUM 40 MG/0.4ML IJ SOSY: 40.0000 mg | PREFILLED_SYRINGE | INTRAMUSCULAR | Status: DC

## 2023-01-08 MED ORDER — OXYCODONE HCL 5 MG PO TABS
5.0000 mg | ORAL_TABLET | ORAL | Status: DC | PRN
  Administered 2023-01-08 (×2): 5 mg via ORAL
  Filled 2023-01-08 (×2): qty 1

## 2023-01-08 MED ORDER — CHLORHEXIDINE GLUCONATE 4 % EX SOLN
60.0000 mL | Freq: Once | CUTANEOUS | Status: AC
  Administered 2023-01-09: 4 via TOPICAL
  Filled 2023-01-08: qty 60

## 2023-01-08 MED ORDER — LEVOFLOXACIN IN D5W 500 MG/100ML IV SOLN
500.0000 mg | INTRAVENOUS | Status: DC
  Filled 2023-01-08: qty 100

## 2023-01-08 MED ORDER — VANCOMYCIN HCL IN DEXTROSE 1-5 GM/200ML-% IV SOLN: 1000.0000 mg | INTRAVENOUS | Status: DC

## 2023-01-08 MED ORDER — IBUPROFEN 400 MG PO TABS
400.0000 mg | ORAL_TABLET | Freq: Three times a day (TID) | ORAL | Status: DC
  Administered 2023-01-08 (×2): 400 mg via ORAL
  Filled 2023-01-08 (×2): qty 1

## 2023-01-08 MED ORDER — HYDROMORPHONE HCL 1 MG/ML IJ SOLN: 1.0000 mg | INTRAMUSCULAR | Status: DC | PRN

## 2023-01-08 NOTE — Assessment & Plan Note (Signed)
Continue glucose cover and monitoring with insulin sliding scale.  Fasting glucose today is 187, with capillary 171 and 201.  Patient is tolerating po well.

## 2023-01-08 NOTE — Assessment & Plan Note (Signed)
Patient not on statin therapy. Check lipid profile.

## 2023-01-08 NOTE — Assessment & Plan Note (Addendum)
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   Wbc is 11,1  Blood cultures with no growth.   Plan to continue antibiotic therapy with IV vancomycin and cefepime.  Hold on metronidazole for now.  Continue analgesics with hydromorphone and oxycodone for pain control. Will add scheduled acetaminophen and ibuprofen.  Prophylactic antiacid therapy with pantoprazole.  Follow up with surgery recommendations, for OR tomorrow.

## 2023-01-08 NOTE — Progress Notes (Signed)
Patient ID: Cody Pena, male   DOB: 1961/07/24, 62 y.o.   MRN: [id number] Patient is seen in follow-up right below-knee amputation with cellulitis.  MRI scan is reviewed which shows osteomyelitis of the distal tibia.  Patient still has induration and cellulitis of the residual limb.  Will plan for revision of the right below-knee amputation tomorrow with resection of the distal tibia.

## 2023-01-08 NOTE — Hospital Course (Signed)
Cody Pena was admitted to the hospital with the working diagnosis of right stump infection.  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  05/24 he was diagnosed with right stump infection and was placed on oral antibiotic therapy with clindamycin. CT right lower extremity showed  findings suggestive  of intra osseous abscess and associated osteomyelitis involving the right stump, that prompted a referral to the ED for further evaluation. In the Ed his temp was 98,1 blood pressure 141/80, HR 94,  RR 18 and 02 saturation 98%, lungs with no wheezing or rales, heart with S1 and S2 present and rhythmic, abdomen with no distention, no lower extremity edema on the left, right stump with local erythema and edema, tender to palpation.   Na 131, K 3,6 Cl 97, bicarbonate 24 glucose 334, bun 12, cr 0,98.  Lactic acid 2,1  Wbc 10,7 hgb 14,6 plt 241.   Right lower extremity MRI with prior right below the knee amputation with osteomyelitis of the distal tibia amputation stump and 1,3 cm intraosseous abscess.  Soft tissue abscess adjacent to the tibia stump with anterior and posterior components.

## 2023-01-08 NOTE — H&P (View-Only) (Signed)
Patient ID: Cody Pena, male   DOB: 08/13/1960, 61 y.o.   MRN: [id number] Patient is seen in follow-up right below-knee amputation with cellulitis.  MRI scan is reviewed which shows osteomyelitis of the distal tibia.  Patient still has induration and cellulitis of the residual limb.  Will plan for revision of the right below-knee amputation tomorrow with resection of the distal tibia. 

## 2023-01-08 NOTE — Progress Notes (Signed)
  Progress Note   Patient: Cody Pena:[id number] DOB: [birth year] DOA: 01/06/2023     2 DOS: the patient was seen and examined on 01/08/2023   Brief hospital course: Cody Pena was admitted to the hospital with the working diagnosis of right stump infection.  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  05/24 he was diagnosed with right stump infection and was placed on oral antibiotic therapy with clindamycin. CT right lower extremity showed  findings suggestive  of intra osseous abscess and associated osteomyelitis involving the right stump, that prompted a referral to the ED for further evaluation. In the Ed his temp was 98,1 blood pressure 141/80, HR 94,  RR 18 and 02 saturation 98%, lungs with no wheezing or rales, heart with S1 and S2 present and rhythmic, abdomen with no distention, no lower extremity edema on the left, right stump with local erythema and edema, tender to palpation.   Na 131, K 3,6 Cl 97, bicarbonate 24 glucose 334, bun 12, cr 0,98.  Lactic acid 2,1  Wbc 10,7 hgb 14,6 plt 241.   Right lower extremity MRI with prior right below the knee amputation with osteomyelitis of the distal tibia amputation stump and 1,3 cm intraosseous abscess.  Soft tissue abscess adjacent to the tibia stump with anterior and posterior components.    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   Wbc is 11,1  Blood cultures with no growth.   Plan to continue antibiotic therapy with IV vancomycin and cefepime.  Hold on metronidazole for now.  Continue analgesics with hydromorphone and oxycodone for pain control. Will add scheduled acetaminophen and ibuprofen.  Prophylactic antiacid therapy with pantoprazole.  Follow up with surgery recommendations, for OR tomorrow.    T2DM (type 2 diabetes  mellitus) (HCC) Continue glucose cover and monitoring with insulin sliding scale.  Fasting glucose today is 187, with capillary 171 and 201.  Patient is tolerating po well.   HTN (hypertension) Blood pressure has been stable with systolic at 125 to 113 mmHg.  Continue to hold on antihypertensive medications.   Hyperlipidemia Patient not on statin therapy. Check lipid profile.         Subjective: Patient with right stump pain, controlled partially with analgesics, no nausea or vomiting.   Physical Exam: Vitals:   01/07/23 1602 01/07/23 2020 01/08/23 0428 01/08/23 0804  BP: 116/79 124/79 125/73 113/65  Pulse: 86 78 84 82  Resp: 16   17  Temp: 97.6 F (36.4 C) 98 F (36.7 C) 98.4 F (36.9 C) 98.4 F (36.9 C)  TempSrc: Oral Oral Oral Oral  SpO2: 96% 95% 97% 96%  Weight:      Height:       Neurology awake and alert ENT with mild pallor Cardiovascular with S1 and S2 present and rhythmic Respiratory with no rales or rhonchi, no wheezing Abdomen with no distention  No lower extremity edema, right BKA stump with dressing in place,  Data Reviewed:    Family Communication: no family at the bedside   Disposition: Status is: Inpatient Remains inpatient appropriate because: right stump infection, needing IV antibiotics and surgical intervention   Planned Discharge Destination: Home     Author: Coralie Keens, MD 01/08/2023 1:38 PM  For on call review [email or web address].

## 2023-01-08 NOTE — Assessment & Plan Note (Signed)
Blood pressure has been stable with systolic at 125 to 113 mmHg.  Continue to hold on antihypertensive medications.

## 2023-01-09 ENCOUNTER — Inpatient Hospital Stay (HOSPITAL_COMMUNITY): Payer: Medicaid Other | Admitting: Registered Nurse

## 2023-01-09 ENCOUNTER — Encounter (HOSPITAL_COMMUNITY)
Admission: EM | Disposition: A | Discharge status: Home / Self Care | Payer: Self-pay | Source: Home / Self Care | Attending: Internal Medicine

## 2023-01-09 ENCOUNTER — Other Ambulatory Visit: Payer: Self-pay

## 2023-01-09 ENCOUNTER — Encounter (HOSPITAL_COMMUNITY): Payer: Self-pay | Admitting: Internal Medicine

## 2023-01-09 DIAGNOSIS — E1169 Type 2 diabetes mellitus with other specified complication: Secondary | ICD-10-CM

## 2023-01-09 DIAGNOSIS — M869 Osteomyelitis, unspecified: Secondary | ICD-10-CM | POA: Diagnosis not present

## 2023-01-09 HISTORY — PX: STUMP REVISION: SHX6102

## 2023-01-09 LAB — GLUCOSE, CAPILLARY
Glucose-Capillary: 117 mg/dL — ABNORMAL HIGH (ref 70–99)
Glucose-Capillary: 132 mg/dL — ABNORMAL HIGH (ref 70–99)
Glucose-Capillary: 144 mg/dL — ABNORMAL HIGH (ref 70–99)
Glucose-Capillary: 155 mg/dL — ABNORMAL HIGH (ref 70–99)
Glucose-Capillary: 172 mg/dL — ABNORMAL HIGH (ref 70–99)
Glucose-Capillary: 196 mg/dL — ABNORMAL HIGH (ref 70–99)
Glucose-Capillary: 266 mg/dL — ABNORMAL HIGH (ref 70–99)
Glucose-Capillary: 273 mg/dL — ABNORMAL HIGH (ref 70–99)

## 2023-01-09 LAB — AEROBIC/ANAEROBIC CULTURE W GRAM STAIN (SURGICAL/DEEP WOUND)

## 2023-01-09 LAB — CBC
HCT: 40.9 % (ref 39.0–52.0)
Hemoglobin: 14 g/dL (ref 13.0–17.0)
MCH: 31.6 pg (ref 26.0–34.0)
MCHC: 34.2 g/dL (ref 30.0–36.0)
MCV: 92.3 fL (ref 80.0–100.0)
Platelets: 226 10*3/uL (ref 150–400)
RBC: 4.43 MIL/uL (ref 4.22–5.81)
RDW: 13.3 % (ref 11.5–15.5)
WBC: 7 10*3/uL (ref 4.0–10.5)
nRBC: 0 % (ref 0.0–0.2)

## 2023-01-09 LAB — BASIC METABOLIC PANEL
Anion gap: 7 (ref 5–15)
BUN: 16 mg/dL (ref 8–23)
CO2: 26 mmol/L (ref 22–32)
Calcium: 8.4 mg/dL — ABNORMAL LOW (ref 8.9–10.3)
Chloride: 100 mmol/L (ref 98–111)
Creatinine, Ser: 1.05 mg/dL (ref 0.61–1.24)
GFR, Estimated: >60 mL/min (ref >60)
Glucose, Bld: 203 mg/dL — ABNORMAL HIGH (ref 70–99)
Potassium: 4.5 mmol/L (ref 3.5–5.1)
Sodium: 133 mmol/L — ABNORMAL LOW (ref 135–145)

## 2023-01-09 SURGERY — REVISION, AMPUTATION SITE
Anesthesia: Choice | Laterality: Right

## 2023-01-09 MED ORDER — VANCOMYCIN HCL 1000 MG IV SOLR
INTRAVENOUS | Status: AC
  Filled 2023-01-09: qty 20

## 2023-01-09 MED ORDER — LIDOCAINE 2% (20 MG/ML) 5 ML SYRINGE
INTRAMUSCULAR | Status: DC | PRN
  Administered 2023-01-09: 100 mg via INTRAVENOUS

## 2023-01-09 MED ORDER — MIDAZOLAM HCL 2 MG/2ML IJ SOLN
INTRAMUSCULAR | Status: AC
  Administered 2023-01-09: 2 mg via INTRAVENOUS
  Filled 2023-01-09: qty 2

## 2023-01-09 MED ORDER — MIDAZOLAM HCL 2 MG/2ML IJ SOLN
INTRAMUSCULAR | Status: AC
  Filled 2023-01-09: qty 2

## 2023-01-09 MED ORDER — FENTANYL CITRATE (PF) 250 MCG/5ML IJ SOLN
INTRAMUSCULAR | Status: AC
  Filled 2023-01-09: qty 5

## 2023-01-09 MED ORDER — BISACODYL 5 MG PO TBEC: 5.0000 mg | DELAYED_RELEASE_TABLET | Freq: Every day | ORAL | Status: DC | PRN

## 2023-01-09 MED ORDER — PANTOPRAZOLE SODIUM 40 MG PO TBEC
40.0000 mg | DELAYED_RELEASE_TABLET | Freq: Every day | ORAL | Status: DC
  Administered 2023-01-09 – 2023-01-12 (×4): 40 mg via ORAL
  Filled 2023-01-09 (×4): qty 1

## 2023-01-09 MED ORDER — ACETAMINOPHEN 325 MG PO TABS: 325.0000 mg | ORAL_TABLET | Freq: Four times a day (QID) | ORAL | Status: DC | PRN

## 2023-01-09 MED ORDER — LACTATED RINGERS IV SOLN: INTRAVENOUS | Status: DC

## 2023-01-09 MED ORDER — GUAIFENESIN-DM 100-10 MG/5ML PO SYRP: 15.0000 mL | ORAL_SOLUTION | ORAL | Status: DC | PRN

## 2023-01-09 MED ORDER — ONDANSETRON HCL 4 MG/2ML IJ SOLN
INTRAMUSCULAR | Status: DC | PRN
  Administered 2023-01-09: 4 mg via INTRAVENOUS

## 2023-01-09 MED ORDER — LABETALOL HCL 5 MG/ML IV SOLN: 10.0000 mg | INTRAVENOUS | Status: DC | PRN

## 2023-01-09 MED ORDER — PROPOFOL 10 MG/ML IV BOLUS
INTRAVENOUS | Status: DC | PRN
  Administered 2023-01-09: 200 mg via INTRAVENOUS

## 2023-01-09 MED ORDER — HYDRALAZINE HCL 20 MG/ML IJ SOLN: 5.0000 mg | INTRAMUSCULAR | Status: DC | PRN

## 2023-01-09 MED ORDER — VANCOMYCIN HCL 1000 MG IV SOLR
INTRAVENOUS | Status: DC | PRN
  Administered 2023-01-09: 1000 mg

## 2023-01-09 MED ORDER — FENTANYL CITRATE (PF) 100 MCG/2ML IJ SOLN
INTRAMUSCULAR | Status: AC
  Administered 2023-01-09: 100 ug via INTRAVENOUS
  Filled 2023-01-09: qty 2

## 2023-01-09 MED ORDER — INSULIN ASPART 100 UNIT/ML IJ SOLN: 0.0000 [IU] | INTRAMUSCULAR | Status: DC | PRN

## 2023-01-09 MED ORDER — ACETAMINOPHEN 325 MG PO TABS: 650.0000 mg | ORAL_TABLET | Freq: Four times a day (QID) | ORAL | Status: DC | PRN

## 2023-01-09 MED ORDER — PHENOL 1.4 % MT LIQD: 1.0000 | OROMUCOSAL | Status: DC | PRN

## 2023-01-09 MED ORDER — FENTANYL CITRATE (PF) 100 MCG/2ML IJ SOLN: 100.0000 ug | Freq: Once | INTRAMUSCULAR | Status: AC

## 2023-01-09 MED ORDER — ONDANSETRON HCL 4 MG/2ML IJ SOLN
INTRAMUSCULAR | Status: AC
  Filled 2023-01-09: qty 2

## 2023-01-09 MED ORDER — DOCUSATE SODIUM 100 MG PO CAPS
100.0000 mg | ORAL_CAPSULE | Freq: Every day | ORAL | Status: DC
  Administered 2023-01-11: 100 mg via ORAL
  Filled 2023-01-09 (×3): qty 1

## 2023-01-09 MED ORDER — ZINC SULFATE 220 (50 ZN) MG PO CAPS
220.0000 mg | ORAL_CAPSULE | Freq: Every day | ORAL | Status: DC
  Administered 2023-01-09 – 2023-01-12 (×4): 220 mg via ORAL
  Filled 2023-01-09 (×4): qty 1

## 2023-01-09 MED ORDER — PHENYLEPHRINE 80 MCG/ML (10ML) SYRINGE FOR IV PUSH (FOR BLOOD PRESSURE SUPPORT)
PREFILLED_SYRINGE | INTRAVENOUS | Status: AC
  Filled 2023-01-09: qty 10

## 2023-01-09 MED ORDER — CHLORHEXIDINE GLUCONATE 0.12 % MT SOLN
OROMUCOSAL | Status: AC
  Administered 2023-01-09: 15 mL via OROMUCOSAL
  Filled 2023-01-09: qty 15

## 2023-01-09 MED ORDER — POLYETHYLENE GLYCOL 3350 17 G PO PACK: 17.0000 g | PACK | Freq: Every day | ORAL | Status: DC | PRN

## 2023-01-09 MED ORDER — FENTANYL CITRATE (PF) 250 MCG/5ML IJ SOLN
INTRAMUSCULAR | Status: DC | PRN
  Administered 2023-01-09: 50 ug via INTRAVENOUS

## 2023-01-09 MED ORDER — JUVEN PO PACK
1.0000 | PACK | Freq: Two times a day (BID) | ORAL | Status: DC
  Administered 2023-01-10 – 2023-01-12 (×6): 1 via ORAL
  Filled 2023-01-09 (×6): qty 1

## 2023-01-09 MED ORDER — PHENYLEPHRINE 80 MCG/ML (10ML) SYRINGE FOR IV PUSH (FOR BLOOD PRESSURE SUPPORT)
PREFILLED_SYRINGE | INTRAVENOUS | Status: DC | PRN
  Administered 2023-01-09: 160 ug via INTRAVENOUS
  Administered 2023-01-09: 80 ug via INTRAVENOUS

## 2023-01-09 MED ORDER — MIDAZOLAM HCL 2 MG/2ML IJ SOLN: 2.0000 mg | Freq: Once | INTRAMUSCULAR | Status: AC

## 2023-01-09 MED ORDER — ALUM & MAG HYDROXIDE-SIMETH 200-200-20 MG/5ML PO SUSP: 15.0000 mL | ORAL | Status: DC | PRN

## 2023-01-09 MED ORDER — CHLORHEXIDINE GLUCONATE 0.12 % MT SOLN: 15.0000 mL | Freq: Once | OROMUCOSAL | Status: AC

## 2023-01-09 MED ORDER — POTASSIUM CHLORIDE CRYS ER 20 MEQ PO TBCR: 20.0000 meq | EXTENDED_RELEASE_TABLET | Freq: Every day | ORAL | Status: DC | PRN

## 2023-01-09 MED ORDER — ORAL CARE MOUTH RINSE: 15.0000 mL | Freq: Once | OROMUCOSAL | Status: AC

## 2023-01-09 MED ORDER — ONDANSETRON HCL 4 MG/2ML IJ SOLN: 4.0000 mg | Freq: Four times a day (QID) | INTRAMUSCULAR | Status: DC | PRN

## 2023-01-09 MED ORDER — OXYCODONE HCL 5 MG PO TABS
10.0000 mg | ORAL_TABLET | ORAL | Status: DC | PRN
  Administered 2023-01-10 (×3): 15 mg via ORAL
  Administered 2023-01-10: 10 mg via ORAL
  Administered 2023-01-11 – 2023-01-12 (×5): 15 mg via ORAL
  Filled 2023-01-09 (×6): qty 3
  Filled 2023-01-09: qty 2
  Filled 2023-01-09 (×2): qty 3

## 2023-01-09 MED ORDER — METOPROLOL TARTRATE 5 MG/5ML IV SOLN: 2.0000 mg | INTRAVENOUS | Status: DC | PRN

## 2023-01-09 MED ORDER — HYDROMORPHONE HCL 1 MG/ML IJ SOLN
0.5000 mg | INTRAMUSCULAR | Status: DC | PRN
  Administered 2023-01-09: 0.5 mg via INTRAVENOUS
  Administered 2023-01-10 – 2023-01-12 (×5): 1 mg via INTRAVENOUS
  Filled 2023-01-09 (×6): qty 1

## 2023-01-09 MED ORDER — VITAMIN C 500 MG PO TABS
1000.0000 mg | ORAL_TABLET | Freq: Every day | ORAL | Status: DC
  Administered 2023-01-09 – 2023-01-12 (×4): 1000 mg via ORAL
  Filled 2023-01-09 (×4): qty 2

## 2023-01-09 MED ORDER — MAGNESIUM SULFATE 2 GM/50ML IV SOLN: 2.0000 g | Freq: Every day | INTRAVENOUS | Status: DC | PRN

## 2023-01-09 MED ORDER — VANCOMYCIN HCL 1250 MG/250ML IV SOLN
1250.0000 mg | Freq: Two times a day (BID) | INTRAVENOUS | Status: DC
  Administered 2023-01-10 – 2023-01-12 (×5): 1250 mg via INTRAVENOUS
  Filled 2023-01-09 (×8): qty 250

## 2023-01-09 MED ORDER — SODIUM CHLORIDE 0.9 % IV SOLN: INTRAVENOUS | Status: DC

## 2023-01-09 MED ORDER — 0.9 % SODIUM CHLORIDE (POUR BTL) OPTIME
TOPICAL | Status: DC | PRN
  Administered 2023-01-09: 1000 mL

## 2023-01-09 MED ORDER — PHENYLEPHRINE HCL-NACL 20-0.9 MG/250ML-% IV SOLN
INTRAVENOUS | Status: DC | PRN
  Administered 2023-01-09: 20 ug/min via INTRAVENOUS

## 2023-01-09 MED ORDER — MAGNESIUM CITRATE PO SOLN: 1.0000 | Freq: Once | ORAL | Status: DC | PRN

## 2023-01-09 MED ORDER — HYDROMORPHONE HCL 1 MG/ML IJ SOLN: 0.5000 mg | INTRAMUSCULAR | Status: DC | PRN

## 2023-01-09 MED ORDER — LIDOCAINE 2% (20 MG/ML) 5 ML SYRINGE
INTRAMUSCULAR | Status: AC
  Filled 2023-01-09: qty 5

## 2023-01-09 MED ORDER — OXYCODONE HCL 5 MG PO TABS
5.0000 mg | ORAL_TABLET | ORAL | Status: DC | PRN
  Administered 2023-01-10 – 2023-01-11 (×3): 10 mg via ORAL
  Filled 2023-01-09 (×3): qty 2

## 2023-01-09 SURGICAL SUPPLY — 32 items
BAG COUNTER SPONGE SURGICOUNT (BAG) ×1 IMPLANT
BAG SPNG CNTER NS LX DISP (BAG) ×1
BLADE SAW RECIP 87.9 MT (BLADE) IMPLANT
BLADE SURG 21 STRL SS (BLADE) ×1 IMPLANT
CANISTER WOUND CARE 500ML ATS (WOUND CARE) ×1 IMPLANT
COVER SURGICAL LIGHT HANDLE (MISCELLANEOUS) ×1 IMPLANT
DRAPE EXTREMITY T 121X128X90 (DISPOSABLE) ×1 IMPLANT
DRAPE HALF SHEET 40X57 (DRAPES) ×1 IMPLANT
DRAPE INCISE IOBAN 66X45 STRL (DRAPES) ×1 IMPLANT
DRAPE U-SHAPE 47X51 STRL (DRAPES) ×2 IMPLANT
DRESSING PREVENA PLUS CUSTOM (GAUZE/BANDAGES/DRESSINGS) ×1 IMPLANT
DRSG PREVENA PLUS CUSTOM (GAUZE/BANDAGES/DRESSINGS) ×1
DURAPREP 26ML APPLICATOR (WOUND CARE) ×1 IMPLANT
ELECT REM PT RETURN 9FT ADLT (ELECTROSURGICAL) ×1
ELECTRODE REM PT RTRN 9FT ADLT (ELECTROSURGICAL) ×1 IMPLANT
GLOVE BIOGEL PI IND STRL 9 (GLOVE) ×1 IMPLANT
GLOVE SURG ORTHO 9.0 STRL STRW (GLOVE) ×1 IMPLANT
GOWN STRL REUS W/ TWL XL LVL3 (GOWN DISPOSABLE) ×2 IMPLANT
GOWN STRL REUS W/TWL XL LVL3 (GOWN DISPOSABLE) ×2
GRAFT SKIN WND MICRO 38 (Tissue) IMPLANT
KIT BASIN OR (CUSTOM PROCEDURE TRAY) ×1 IMPLANT
KIT TURNOVER KIT B (KITS) ×1 IMPLANT
MANIFOLD NEPTUNE II (INSTRUMENTS) ×1 IMPLANT
NS IRRIG 1000ML POUR BTL (IV SOLUTION) ×1 IMPLANT
PACK GENERAL/GYN (CUSTOM PROCEDURE TRAY) ×1 IMPLANT
PAD ARMBOARD 7.5X6 YLW CONV (MISCELLANEOUS) ×1 IMPLANT
PREVENA RESTOR ARTHOFORM 46X30 (CANNISTER) ×1 IMPLANT
STAPLER VISISTAT 35W (STAPLE) IMPLANT
SUT ETHILON 2 0 PSLX (SUTURE) ×2 IMPLANT
SUT SILK 2 0 (SUTURE) ×1
SUT SILK 2-0 18XBRD TIE 12 (SUTURE) IMPLANT
TOWEL GREEN STERILE (TOWEL DISPOSABLE) ×1 IMPLANT

## 2023-01-09 NOTE — Interval H&P Note (Signed)
History and Physical Interval Note:  01/09/2023 6:44 AM  Cody Pena  has presented today for surgery, with the diagnosis of Osteomyelitis Tibia.  The various methods of treatment have been discussed with the patient and family. After consideration of risks, benefits and other options for treatment, the patient has consented to  Procedure(s): REVISION RIGHT BELOW KNEE AMPUTATION (Right) as a surgical intervention.  The patient's history has been reviewed, patient examined, no change in status, stable for surgery.  I have reviewed the patient's chart and labs.  Questions were answered to the patient's satisfaction.     Nadara Mustard

## 2023-01-09 NOTE — Progress Notes (Signed)
Pharmacy Antibiotic Note  Cody Pena is a 62 y.o. male admitted on 01/06/2023 with concern for osteomyelitis.  Pharmacy has been consulted for vancomycin and cefepime dosing.  SCr up slightly to 1.05 - will recalculate eAUC dosing.  Plan to OR today for R BKA revision and resection of distial tibia.   Plan: Change Vancomycin to 1250mg  IV Q12H. Goal AUC 400-550.  Expected AUC 458, SCr 1.05. Continue Cefepime 2g IV Q8H. Will f/u renal function, micro data, and pt's clinical condition Consider levels soon depending on ortho recs post OR  Height: 6\' 4"  (193 cm) Weight: 97.5 kg (214 lb 15.2 oz) IBW/kg (Calculated) : 86.8  Temp (24hrs), Avg:97.9 F (36.6 C), Min:97.5 F (36.4 C), Max:98.4 F (36.9 C)  Recent Labs  Lab 01/06/23 0126 01/06/23 0320 01/06/23 0453 01/07/23 0114 01/09/23 0127  WBC 10.7*  --  10.2 11.1* 7.0  CREATININE 0.93  --  0.85 0.93 1.05  LATICACIDVEN 2.1* 1.3  --   --   --      Estimated Creatinine Clearance: 88.4 mL/min (by C-G formula based on SCr of 1.05 mg/dL).    Allergies  Allergen Reactions   Ceftriaxone Itching, Nausea And Vomiting and Rash    Noted minutes after starting medication No rash noted with Ancef on 01-31-2022 during general anesthesia Has received cefepime and cefazolin without reaction    Vanc 5/26 >> Cefepime 5/26 >> Flagyl 5/26 >> 5/28  5/26 Bcx NGTD 5/26 MRSA PCR: positive  Thank you for allowing pharmacy to be a part of this patient's care.  Christoper Fabian, PharmD, BCPS Please see amion for complete clinical pharmacist phone list 01/09/2023 10:37 AM

## 2023-01-09 NOTE — Op Note (Signed)
01/09/2023  6:18 PM  PATIENT:  Cody Pena    PRE-OPERATIVE DIAGNOSIS:  Osteomyelitis Tibia  POST-OPERATIVE DIAGNOSIS:  Same  PROCEDURE:  REVISION RIGHT BELOW KNEE AMPUTATION Application Kerecis micro graft 38 cm. Application of vancomycin powder 1 g. Tissue sent for cultures. Application of Prevena customizable wound VAC dressing with stump shrinker  SURGEON:  Nadara Mustard, MD  PHYSICIAN ASSISTANT:None ANESTHESIA:   General  PREOPERATIVE INDICATIONS:  Cody Pena is a  62 y.o. male with a diagnosis of Osteomyelitis Tibia who failed conservative measures and elected for surgical management.    The risks benefits and alternatives were discussed with the patient preoperatively including but not limited to the risks of infection, bleeding, nerve injury, cardiopulmonary complications, the need for revision surgery, among others, and the patient was willing to proceed.  OPERATIVE IMPLANTS:   Implant Name Type Inv. Item Serial No. Manufacturer Lot No. LRB No. Used Action  GRAFT SKIN WND MICRO 38 - [id number] Tissue GRAFT SKIN WND MICRO 38  [employer] [id number] Right 1 Implanted    @ENCIMAGES @  OPERATIVE FINDINGS: Tissue margins were clear.  OPERATIVE PROCEDURE: Patient was brought the operating room and underwent general anesthetic.  After adequate levels anesthesia obtained patient's right lower extremity was prepped using DuraPrep draped into a sterile field a timeout was called.  A fishmouth incision was made around the ulcerative tissue this was carried down to bone.  The distal 2 cm of tibia and fibula were resected.  The anterior aspect of the tibia was beveled.  There is no signs of infection at the amputation margins.  The tissue margins were clear the wound was irrigated with normal saline.  The wound bed was filled with 38 cm Kerecis micro graft and 1 g vancomycin powder.  The incision was closed using 2-0 nylon.  A Prevena customizable wound VAC was applied this  had a good suction fit this was overwrapped with a stump shrinker.  Patient was extubated taken the PACU in stable condition.   DISCHARGE PLANNING:  Antibiotic duration: Continue antibiotics adjust according to culture sensitivities.  Weightbearing: Nonweightbearing on the right  Pain medication: Opioid pathway  Dressing care/ Wound VAC: Continue wound VAC for 1 week  Ambulatory devices: Walker  Discharge to: Discharge planning based on therapy recommendations.  Follow-up: In the office 1 week post operative.

## 2023-01-09 NOTE — Progress Notes (Signed)
PROGRESS NOTE   Cody Pena  EAV:[id number]    DOB: [birth year]    DOA: 01/06/2023  PCP: Pcp, No   I have briefly reviewed patients previous medical [facility] in [facility]  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  05/24 he was diagnosed with right stump infection and was placed on oral antibiotic therapy with clindamycin. CT right lower extremity showed  findings suggestive  of intra osseous abscess and associated osteomyelitis involving the right stump, that prompted a referral to the ED for further evaluation.  Admitted for right BKA amputation with osteomyelitis, abscess and cellulitis, orthopedics consulted and plan revision of right BKA with resection of the distal tibia by Dr. Lajoyce Corners 5/29.   Assessment & Plan:  Active Problems:   Osteomyelitis (HCC)   T2DM (type 2 diabetes mellitus) (HCC)   HTN (hypertension)   Hyperlipidemia   Right BKA stump osteomyelitis, abscess and cellulitis: - MRI right tibia/fibula 5/27: Prior right below-knee amputation with osteomyelitis of the distal tibia amputation stump and 1.3 cm intraosseous abscess. Soft tissue abscess adjacent to the tibial stump with anterior and posterior components. - Continue IV vancomycin and cefepime.  Blood cultures x 2: NTD.  MRSA PCR +. - Multimodality pain control - Orthopedics/Dr. Lajoyce Corners input appreciated and plans revision of right BKA with resection of the distal tibia on 5/29.  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 -Currently mildly uncontrolled and fluctuating, likely precipitated by ongoing infection and should improve after source control. -Continue SSI every 4  hours while awaiting surgery and then can be switched over to 3 times daily before meals and at bedtime.  Adjust insulins as needed.  Continue gabapentin -Appears that he was on no DM meds PTA but will need to verify this with patient. -DM coordinator consultation.  Tobacco abuse Cessation counseled.  Continue nicotine patch.  Hyponatremia: Likely multifactorial, initially due to some dehydration and now due to pseudohyponatremia from hyperglycemia.  Clinically euvolemic.  Discontinue IV fluids  Hypertension: Controlled off of meds.  Hyperlipidemia: Not on meds PTA.  Body mass index is 26.16 kg/m.  Nutritional Status Nutrition Problem: Increased nutrient needs Etiology: acute illness Signs/Symptoms: estimated needs Interventions: Glucerna shake  Pressure Ulcer:    ACP Documents: None present DVT prophylaxis: enoxaparin (LOVENOX) injection 40 mg Start: 01/08/23 2200     Code Status: Full Code:  Family Communication: None at bedside Disposition:  Status is: Inpatient Remains inpatient appropriate because: Awaiting surgery, remains on IV antibiotics     Consultants:   Orthopedics/Dr. Lajoyce Corners  Procedures:     Antimicrobials:   As noted above   Subjective:  Seen this morning.  States that he is quite independent and was able to ambulate using his prosthesis until recently when he could not do due to pain of his right stump wound.  Says that he is supposed to have surgery at 5 PM this afternoon.  Seen prior to procedure.  Objective:   Vitals:   01/08/23 1625 01/08/23 2020 01/09/23 0429 01/09/23 0758  BP: 134/77 111/64 (!) 99/54 118/64  Pulse: 79 71 60 68  Resp: 17 18 16 16   Temp: 98.4 F (36.9 C) 98 F (36.7  C) (!) 97.5 F (36.4 C) (!) 97.5 F (36.4 C)  TempSrc: Oral Oral Oral Oral  SpO2: 97% 97% 95% 98%  Weight:      Height:        General exam: Middle-age male, moderately built and nourished lying comfortably propped up in bed without distress.  Oral  mucosa moist. Respiratory system: Clear to auscultation. Respiratory effort normal. Cardiovascular system: S1 & S2 heard, RRR. No JVD, murmurs, rubs, gallops or clicks. No pedal edema. Gastrointestinal system: Abdomen is nondistended, soft and nontender. No organomegaly or masses felt. Normal bowel sounds heard. Central nervous system: Alert and oriented. No focal neurological deficits. Extremities: Symmetric 5 x 5 power.  Right BKA stump dressing clean and dry.  Extensive scarring over the medial aspect of left knee/popliteal fossa. Skin: No rashes, lesions or ulcers Psychiatry: Judgement and insight appear normal. Mood & affect appropriate.     Data Reviewed:   I have personally reviewed following labs and imaging studies   CBC: Recent Labs  Lab 01/06/23 0126 01/06/23 0453 01/07/23 0114 01/09/23 0127  WBC 10.7* 10.2 11.1* 7.0  NEUTROABS 6.4  --  8.2*  --   HGB 14.6 14.2 13.6 14.0  HCT 43.5 41.8 40.2 40.9  MCV 92.6 93.1 91.0 92.3  PLT 241 227 274 226    Basic Metabolic Panel: Recent Labs  Lab 01/06/23 0126 01/06/23 0453 01/07/23 0114 01/09/23 0127  NA 131* 130* 134* 133*  K 3.6 3.8 3.7 4.5  CL 97* 98 100 100  CO2 24 25 26 26   GLUCOSE 334* 309* 187* 203*  BUN 12 13 13 16   CREATININE 0.93 0.85 0.93 1.05  CALCIUM 8.4* 8.1* 8.1* 8.4*  MG  --  1.9 1.9  --   PHOS  --  3.4  --   --     Liver Function Tests: Recent Labs  Lab 01/06/23 0126 01/07/23 0114  AST 12* 10*  ALT 12 11  ALKPHOS 82 66  BILITOT 0.4 0.4  PROT 6.7 6.4*  ALBUMIN 2.9* 2.7*    CBG: Recent Labs  Lab 01/09/23 0427 01/09/23 0806 01/09/23 1150  GLUCAP 266* 196* 132*    Microbiology Studies:   Recent Results (from the past 240 hour(s))  Culture, blood (Routine X 2) w Reflex to ID Panel     Status: None (Preliminary result)   Collection Time: 01/06/23  4:52 AM   Specimen: BLOOD RIGHT FOREARM  Result Value Ref Range Status   Specimen Description BLOOD RIGHT FOREARM  Final   Special  Requests   Final    BOTTLES DRAWN AEROBIC AND ANAEROBIC Blood Culture adequate volume   Culture   Final    NO GROWTH 3 DAYS Performed at [facility], [address], [place], [place] [address]    Report Status PENDING  Incomplete  Culture, blood (Routine X 2) w Reflex to ID Panel     Status: None (Preliminary result)   Collection Time: 01/06/23  4:58 AM   Specimen: BLOOD  Result Value Ref Range Status   Specimen Description BLOOD RIGHT ANTECUBITAL  Final   Special Requests   Final    BOTTLES DRAWN AEROBIC AND ANAEROBIC Blood Culture adequate volume   Culture   Final    NO GROWTH 3 DAYS Performed at [facility], [address] [place], [place] [address]    Report Status PENDING  Incomplete  MRSA Next Gen by PCR, Nasal     Status: Abnormal   Collection  Time: 01/06/23  7:35 AM   Specimen: Nasal Mucosa; Nasal Swab  Result Value Ref Range Status   MRSA by PCR Next Gen DETECTED (A) NOT DETECTED Final    Comment: RESULT CALLED TO, READ BACK BY AND VERIFIED WITH: 01/06/23 AT 1010 TO RN Adin Hector, ADC (NOTE) The GeneXpert MRSA Assay (FDA approved for NASAL specimens only), is one component of a comprehensive MRSA colonization surveillance program. It is not intended to diagnose MRSA infection nor to guide or monitor treatment for MRSA infections. Test performance is not FDA approved in patients less than 11 years old. Performed at [facility], [address], [place], [place] [address]   Surgical PCR screen     Status: Abnormal   Collection Time: 01/08/23  8:44 PM   Specimen: Nasal Mucosa; Nasal Swab  Result Value Ref Range Status   MRSA, PCR POSITIVE (A) NEGATIVE Final    Comment: RESULT CALLED TO, READ BACK BY AND VERIFIED WITH: RN JOSEPHINE C ON 01/08/23 @ 2224 BY DRT    Staphylococcus aureus POSITIVE (A) NEGATIVE Final    Comment: (NOTE) The Xpert SA Assay (FDA approved for NASAL specimens in patients 72 years of age and older), is one component of a  comprehensive surveillance program. It is not intended to diagnose infection nor to guide or monitor treatment. Performed at [facility], [address], [place], [place] [address]     Radiology Studies:  No results found.  Scheduled Meds:    acetaminophen  650 mg Oral Q6H   enoxaparin (LOVENOX) injection  40 mg Subcutaneous Q24H   feeding supplement (GLUCERNA SHAKE)  237 mL Oral BID BM   gabapentin  600 mg Oral TID   ibuprofen  400 mg Oral TID   insulin aspart  0-9 Units Subcutaneous Q4H   multivitamin with minerals  1 tablet Oral Daily   mupirocin ointment   Nasal BID   nicotine  14 mg Transdermal Daily   pantoprazole  40 mg Oral Daily    Continuous Infusions:    ceFEPime (MAXIPIME) IV 2 g (01/09/23 1238)   levofloxacin (LEVAQUIN) IV     vancomycin       LOS: 3 days     Marcellus Scott, MD,  FACP, FHM, SFHM, [facility], [facility]   [facility] Advisor [place]     To contact the attending provider between 7A-7P or the covering provider during after hours 7P-7A, please log into the web site [email or web address] and access using universal  password for that web site. If you do not have the password, please call the hospital operator.  01/09/2023, 1:01 PM

## 2023-01-09 NOTE — Anesthesia Procedure Notes (Signed)
Procedure Name: Intubation Date/Time: 01/09/2023 5:45 PM  Performed by: Sharyn Dross, CRNAPre-anesthesia Checklist: Patient identified, Emergency Drugs available, Suction available and Patient being monitored Patient Re-evaluated:Patient Re-evaluated prior to induction Oxygen Delivery Method: Circle system utilized Preoxygenation: Pre-oxygenation with 100% oxygen Induction Type: IV induction Ventilation: Mask ventilation without difficulty LMA Size: 5.0 Number of attempts: 1 Placement Confirmation: positive ETCO2 and breath sounds checked- equal and bilateral Tube secured with: Tape Dental Injury: Teeth and Oropharynx as per pre-operative assessment

## 2023-01-09 NOTE — Transfer of Care (Signed)
Immediate Anesthesia Transfer of Care Note  Patient: Cody Pena  Procedure(s) Performed: REVISION RIGHT BELOW KNEE AMPUTATION (Right)  Patient Location: PACU  Anesthesia Type:General  Level of Consciousness: awake, alert , and oriented  Airway & Oxygen Therapy: Patient connected to face mask oxygen  Post-op Assessment: Report given to RN and Post -op Vital signs reviewed and stable  Post vital signs: Reviewed and stable  Last Vitals:  Vitals Value Taken Time  BP 110/68   Temp    Pulse 73   Resp    SpO2 98     Last Pain:  Vitals:   01/09/23 1500  TempSrc: Oral  PainSc: 5       Patients Stated Pain Goal: 0 (01/06/23 2145)  Complications: There were no known notable events for this encounter.

## 2023-01-10 DIAGNOSIS — M869 Osteomyelitis, unspecified: Secondary | ICD-10-CM | POA: Diagnosis not present

## 2023-01-10 LAB — GLUCOSE, CAPILLARY
Glucose-Capillary: 186 mg/dL — ABNORMAL HIGH (ref 70–99)
Glucose-Capillary: 160 mg/dL — ABNORMAL HIGH (ref 70–99)
Glucose-Capillary: 237 mg/dL — ABNORMAL HIGH (ref 70–99)
Glucose-Capillary: 192 mg/dL — ABNORMAL HIGH (ref 70–99)
Glucose-Capillary: 168 mg/dL — ABNORMAL HIGH (ref 70–99)
Glucose-Capillary: 207 mg/dL — ABNORMAL HIGH (ref 70–99)

## 2023-01-10 LAB — CBC
HCT: 41.6 % (ref 39.0–52.0)
Hemoglobin: 13.9 g/dL (ref 13.0–17.0)
MCH: 31.4 pg (ref 26.0–34.0)
MCHC: 33.4 g/dL (ref 30.0–36.0)
MCV: 93.9 fL (ref 80.0–100.0)
Platelets: 167 10*3/uL (ref 150–400)
RBC: 4.43 MIL/uL (ref 4.22–5.81)
RDW: 13.3 % (ref 11.5–15.5)
WBC: 10.8 10*3/uL — ABNORMAL HIGH (ref 4.0–10.5)
nRBC: 0 % (ref 0.0–0.2)

## 2023-01-10 LAB — BASIC METABOLIC PANEL
Anion gap: 9 (ref 5–15)
BUN: 17 mg/dL (ref 8–23)
CO2: 25 mmol/L (ref 22–32)
Calcium: 8.4 mg/dL — ABNORMAL LOW (ref 8.9–10.3)
Chloride: 97 mmol/L — ABNORMAL LOW (ref 98–111)
Creatinine, Ser: 1.06 mg/dL (ref 0.61–1.24)
GFR, Estimated: >60 mL/min (ref >60)
Glucose, Bld: 223 mg/dL — ABNORMAL HIGH (ref 70–99)
Potassium: 4.1 mmol/L (ref 3.5–5.1)
Sodium: 131 mmol/L — ABNORMAL LOW (ref 135–145)

## 2023-01-10 MED ORDER — LIVING WELL WITH DIABETES BOOK
Freq: Once | Status: AC
  Filled 2023-01-10: qty 1

## 2023-01-10 MED ORDER — INSULIN ASPART PROT & ASPART (70-30 MIX) 100 UNIT/ML ~~LOC~~ SUSP
7.0000 [IU] | Freq: Two times a day (BID) | SUBCUTANEOUS | Status: DC
  Administered 2023-01-10: 7 [IU] via SUBCUTANEOUS
  Filled 2023-01-10 (×2): qty 10

## 2023-01-10 NOTE — Inpatient Diabetes Management (Signed)
Inpatient Diabetes Program Recommendations  AACE/ADA: New Consensus Statement on Inpatient Glycemic Control (2015)  Target Ranges:  Prepandial:   less than 140 mg/dL      Peak postprandial:   less than 180 mg/dL (1-2 hours)      Critically ill patients:  140 - 180 mg/dL   Lab Results  Component Value Date   GLUCAP 192 (H) 01/10/2023   HGBA1C 10.8 (H) 01/06/2023    Review of Glycemic Control  Diabetes history: DM2 Outpatient Diabetes medications: none recently Current orders for Inpatient glycemic control: Novolog 70/30 insulin 7 units BID,  Novolog 0-9 units correction scale every 4 hours.  Inpatient Diabetes Program Recommendations:   Spoke with patient to follow up about patient's insulin regimen. States that he has used both insulin pens and vial and syringe for many years. Has not been able to afford the insulin since his PCP retired.   Recommend [facility] Relion 70/30 insulin to take twice a day. States that he can afford $42 per box of pens. Is waiting for Medicaid to be approved. Will be seen at [facility] on June 4 at 2:30 pm. Ordered Living Well with Diabetes booklet from pharmacy.   Will continue to monitor blood sugars while in the hospital.  Smith Mince RN BSN CDE Diabetes Coordinator Pager: [phone]  8am-5pm

## 2023-01-10 NOTE — Progress Notes (Signed)
Patient ID: Cody Pena, male   DOB: October 05, 1960, 62 y.o.   MRN: [id number] Patient is postoperative day 1 revision below-knee amputation.  Wound VAC is functioning well.  Cultures are pending.  Anticipate discharge on oral antibiotics after cultures finalized.  Wound margins were clear.

## 2023-01-10 NOTE — TOC Initial Note (Addendum)
Transition of Care (TOC) - Initial/Assessment Note   Spoke to patient at bedside. Patient from home with family .  Patient has no insurance, however reports he has spoken to Artist here at [facility] and applied for Medicaid . Will confirm.   Patient does not have PCP. Interested in establishing care with Mauricio Po at [facility] . NCM called [facility] and placed on speaker phone. Mauricio Po is not accepting new patients at this time, they have two other providers who are accepting new patients. Patient would need to pay $115.00 for office visit this does not include any labs, procedures etc. Patient declined at present .   NCM discussed [facility] . Patient states he knows where that is and is agreement. NCM called patient has appointment January 15, 2023 at 2:30 pm, he needs to take ID, proof of address and if he is working pay stubs. Patient aware. Patient currently not working.   Appointment on AVS.   Patient has a blood sugar machine at home.   He has discussed getting insulin at [facility] with Diabetes Coordinator .   Patient for anticipated discharge tomorrow. NCM changed pharmacy to [facility]. If patient cannot afford discharge medications will assist with MATCH . Patient voiced understanding   Patient has had a prevena VAC in past   PT entered room as NCM was leaving.   Will follow for recommendations.      Patient has a wheel chair and walker . He plans on purchasing another walker  Patient Details  Name: Cody Pena MRN: [id number] Date of Birth: 11-16-1960  Transition of Care [facility]) CM/SW Contact:    Kingsley Plan, RN Phone Number: 01/10/2023, 1:22 PM  Clinical Narrative:                   Expected Discharge Plan: Home/Self Care Barriers to Discharge: Continued Medical Work up   Patient Goals and CMS Choice Patient states their goals for this hospitalization and ongoing recovery are:: to return to home     [name]  ownership interest in [facility].provided to:: Patient    Expected Discharge Plan and Services   Discharge Planning Services: CM Consult   Living arrangements for the past 2 months: Single Family Home                   DME Agency: NA                  Prior Living Arrangements/Services Living arrangements for the past 2 months: Single Family Home Lives with:: Spouse Patient language and need for interpreter reviewed:: Yes Do you feel safe going back to the place where you live?: Yes      Need for Family Participation in Patient Care: Yes (Comment) Care giver support system in place?: Yes (comment) Current home services: DME Criminal Activity/Legal Involvement Pertinent to Current Situation/Hospitalization: No - Comment as needed  Activities of Daily Living Home Assistive Devices/Equipment: Wheelchair ADL Screening (condition at time of admission) Patient's cognitive ability adequate to safely complete daily activities?: Yes Is the patient deaf or have difficulty hearing?: No Does the patient have difficulty seeing, even when wearing glasses/contacts?: No Does the patient have difficulty concentrating, remembering, or making decisions?: No Patient able to express need for assistance with ADLs?: Yes Does the patient have difficulty dressing or bathing?: No Independently performs ADLs?: Yes (appropriate for developmental age) Does the patient have difficulty walking or climbing stairs?: Yes Weakness of  Legs: Right (AKA) Weakness of Arms/Hands: None  Permission Sought/Granted         Permission granted to share info w AGENCY: [facility] and [facility]        Emotional Assessment Appearance:: Appears stated age Attitude/Demeanor/Rapport: Engaged Affect (typically observed): Accepting Orientation: : Oriented to Self, Oriented to Place, Oriented to  Time, Oriented to Situation Alcohol / Substance Use: Not Applicable Psych Involvement: No  (comment)  Admission diagnosis:  Abscess [L02.91] Osteomyelitis (HCC) [M86.9] Osteomyelitis of right tibia, unspecified type [facility]) [M86.9] Patient Active Problem List   Diagnosis Date Noted   Malnutrition of moderate degree 12/28/2021   Abscess of right lower leg    Wound infection 12/26/2021   S/P BKA (below knee amputation), right (HCC) 09/30/2021   Below-knee amputation of right lower extremity (HCC) 09/27/2021   Dehiscence of amputation stump of right lower extremity (HCC)    Wound infection after surgery 09/16/2021   Cellulitis of right lower extremity    Peripheral vascular disease (HCC)    History of transmetatarsal amputation of right foot (HCC) 08/25/2021   Osteomyelitis (HCC) 07/24/2021   HTN (hypertension) 07/23/2021   Hyperlipidemia 07/23/2021   Diabetic foot ulcers (HCC) 07/23/2021   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) 01/14/2018   Ulcer of left foot, limited to breakdown of skin (HCC) 01/14/2018   Foot osteomyelitis, left (HCC)    Septic arthritis of foot (HCC) 11/11/2017   Osteomyelitis of ankle or foot, acute, left (HCC) 11/11/2017   Type 2 DM with diabetic peripheral angiopathy w/o gangrene (HCC) 11/11/2017   Cigarette smoker 11/11/2017   Diabetic peripheral neuropathy (HCC) 11/11/2017   Diabetic foot infection (HCC) 11/10/2017   Post op infection    Delirium 12/07/2014   Cervical vertebral fusion 12/07/2014   Hallucination 12/07/2014   T2DM (type 2 diabetes mellitus) (HCC) 12/07/2014   Mood disorder (HCC) 12/07/2014   Pyrexia    Neck swelling    PCP:  Pcp, No Pharmacy:   [facility] - [place], [place] - [address] [place] [address] Phone: [phone] Fax: [phone]  [facility] [address] [place] [address] Phone: [phone] Fax: [phone]     Social Determinants of Health (SDOH) Social History: SDOH Screenings   Food  Insecurity: No Food Insecurity (01/06/2023)  Housing: Low Risk  (01/06/2023)  Transportation Needs: No Transportation Needs (01/06/2023)  Utilities: Not At Risk (01/06/2023)  Tobacco Use: High Risk (01/09/2023)   SDOH Interventions:     Readmission Risk Interventions     No data to display

## 2023-01-10 NOTE — Evaluation (Signed)
Physical Therapy Evaluation and Discharge Patient Details Name: Cody Pena MRN: [id number] DOB: 03-Jul-1961 Today's Date: 01/10/2023  History of Present Illness  62 y.o. male who [facility] to [name] from home 01/06/23 due to right stump pain and MRI showed osteomyelitis of the distal tibia. 5/29 to OR for revision R BKA  PMHx:  Covid, anxiety/depression, uncontrolled DM, HTN, PVD, cervical spine fusion, peripheral neuropathy, OA, R BKA  Clinical Impression   Patient evaluated by Physical Therapy with no further acute PT needs identified. All education has been completed and the patient has no further questions. PT is signing off. Thank you for this referral.        Recommendations for follow up therapy are one component of a multi-disciplinary discharge planning process, led by the attending physician.  Recommendations may be updated based on patient status, additional functional criteria and insurance authorization.  Follow Up Recommendations       Assistance Recommended at Discharge PRN  Patient can return home with the following  Assistance with cooking/housework;Assist for transportation;Help with stairs or ramp for entrance    Equipment Recommendations None recommended by PT  Recommendations for Other Services       Functional Status Assessment Patient has had a recent decline in their functional status and demonstrates the ability to make significant improvements in function in a reasonable and predictable amount of time.     Precautions / Restrictions Precautions Precautions: Fall Restrictions Weight Bearing Restrictions: Yes RLE Weight Bearing: Non weight bearing      Mobility  Bed Mobility Overal bed mobility: Modified Independent                  Transfers Overall transfer level: Needs assistance Equipment used: Rolling walker (2 wheels), Crutches Transfers: Sit to/from Stand Sit to Stand: Supervision, Min assist           General transfer  comment: min assist for balance with crutches; supervision with RW    Ambulation/Gait Ambulation/Gait assistance: Max assist, Min guard Gait Distance (Feet): 60 Feet (with RW; 3 ft with crutches) Assistive device: Rolling walker (2 wheels), Crutches Gait Pattern/deviations: Step-to pattern   Gait velocity interpretation: 1.31 - 2.62 ft/sec, indicative of limited community ambulator   General Gait Details: with RW vc for rolling instead of lifting RW to advance; with crutches pt with significant LOB after 3 ft and hopped his way back to land on the bed in sitting; pt no longer wants to try crutches  Stairs            Wheelchair Mobility    Modified Rankin (Stroke Patients Only)       Balance Overall balance assessment: Needs assistance Sitting-balance support: Feet supported Sitting balance-[name]: Normal     Standing balance support: During functional activity, Reliant on assistive device for balance, Bilateral upper extremity supported Standing balance-[name]: Fair Standing balance comment: statically can stand briefly without UE support; dynamically needs UE support                             Pertinent Vitals/Pain Pain Assessment Pain Assessment: 0-10 Pain Score: 8  Pain Location: RLE Pain Descriptors / Indicators: Aching, Constant, Jabbing Pain Intervention(s): Limited activity within patient's tolerance, Monitored during session, RN gave pain meds during session    Home Living Family/patient expects to be discharged to:: Private residence Living Arrangements: Children Available Help at Discharge: Family Type of Home: House Home Access: Ramped entrance  Home Layout: One level Home Equipment: Agricultural consultant (2 wheels);Wheelchair - manual;Shower seat (RW was bent by his grandson; he plans to order from [employer] as it will be cheaper for him) Additional Comments: Pt lives at home with daughter who is available 24/7 to assist if needed     Prior Function Prior Level of Function : Independent/Modified Independent;Driving             Mobility Comments: used prosthetic ADLs Comments: Ind with prosthetic     Hand Dominance        Extremity/Trunk Assessment   Upper Extremity Assessment Upper Extremity Assessment: Overall WFL for tasks assessed    Lower Extremity Assessment Lower Extremity Assessment: RLE deficits/detail (LLE WNL including sensation) RLE Deficits / Details: post BKA revision with excellent ROM and wound VAC    Cervical / Trunk Assessment Cervical / Trunk Assessment: Normal  Communication   Communication: No difficulties  Cognition Arousal/Alertness: Awake/alert Behavior During Therapy: WFL for tasks assessed/performed Overall Cognitive Status: Within Functional Limits for tasks assessed                                          General Comments General comments (skin integrity, edema, etc.): Educated on elevating R BKA higher than his heart and positioned foot of bed as high as possible and added 2 folded blankets under RLE (no pillows)    Exercises     Assessment/Plan    PT Assessment Patient does not need any further PT services  PT Problem List         PT Treatment Interventions      PT Goals (Current goals can be found in the Care Plan section)  Acute Rehab PT Goals Patient Stated Goal: return home ASAP PT Goal Formulation: All assessment and education complete, DC therapy    Frequency       Co-evaluation               AM-PAC PT "6 Clicks" Mobility  Outcome Measure Help needed turning from your back to your side while in a flat bed without using bedrails?: None Help needed moving from lying on your back to sitting on the side of a flat bed without using bedrails?: None Help needed moving to and from a bed to a chair (including a wheelchair)?: A Little Help needed standing up from a chair using your arms (e.g., wheelchair or bedside chair)?: A  Little Help needed to walk in hospital room?: A Little Help needed climbing 3-5 steps with a railing? : Total 6 Click Score: 18    End of Session   Activity Tolerance: Patient tolerated treatment well Patient left: in bed;with call bell/phone within reach (bed alarm not on and pt says they have let him stand at bedside to use urinal)   PT Visit Diagnosis: Other abnormalities of gait and mobility (R26.89)    Time: 1317-1340 PT Time Calculation (min) (ACUTE ONLY): 23 min   Charges:   PT Evaluation $PT Eval Low Complexity: 1 Low           Cody Pena, PT Acute Rehabilitation Services  Office [phone]   Zena Amos 01/10/2023, 1:52 PM

## 2023-01-10 NOTE — Progress Notes (Addendum)
PROGRESS NOTE   Cody Pena  ZOX:[id number]    DOB: 15-May-1961    DOA: 01/06/2023  PCP: Pcp, No   I have briefly reviewed patients previous medical records in [facility].  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  05/24 he was diagnosed with right stump infection and was placed on oral antibiotic therapy with clindamycin. CT right lower extremity showed  findings suggestive  of intra osseous abscess and associated osteomyelitis involving the right stump, that prompted a referral to the ED for further evaluation.  Admitted for right BKA amputation with osteomyelitis, abscess and cellulitis, orthopedics consulted s/p revision of right BKA by Dr. Lajoyce Corners 5/29.   Assessment & Plan:  Active Problems:   Osteomyelitis (HCC)   T2DM (type 2 diabetes mellitus) (HCC)   HTN (hypertension)   Hyperlipidemia   Right BKA stump osteomyelitis, abscess and cellulitis: - MRI right tibia/fibula 5/27: Prior right below-knee amputation with osteomyelitis of the distal tibia amputation stump and 1.3 cm intraosseous abscess. Soft tissue abscess adjacent to the tibial stump with anterior and posterior components. - Continue IV vancomycin and cefepime.  Blood cultures x 2: NTD.  MRSA PCR +. - Multimodality pain control - Orthopedics/Dr. Lajoyce Corners consulted and s/p revision of right BKA by Dr. Lajoyce Corners 5/29.  Operative tissue culture from right leg 5/29: Negative to date and no organisms on Gram stain.  Has Prevena wound VAC x 1 week.  Nonweightbearing on right. -Per patient's report, was advised by Dr. Lajoyce Corners that patient may be ready for DC in a.m.  May need to discuss with Dr. Lajoyce Corners or ID regarding antibiotic choice and  duration at discharge.  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 -Patient reports that he used to be on Lantus 20 units once a day and Humalog SSI in the past.  He was also ordered metformin but did not take it due to GI intolerance.  Mostly quit taking his insulins after his PCP retired and also lost his insurance.  Occasionally checks CBG and takes Humalog. -Patient reports that he met with the DM coordinator who discussed affordable insulin at approximately $44 a vial, suspect the [employer] brand which seems quite reasonable.  He states that he has just applied for Medicaid while he was in the hospital which may take several months. -Initiated 70/30 insulin, 7 units twice daily with meals.  Continue sensitive NovoLog SSI without at bedtime cover.  Monitor closely and adjust insulins as needed.  Tobacco abuse Cessation counseled.  Continue nicotine patch.  Hyponatremia: Likely multifactorial, initially due to some dehydration and now due to pseudohyponatremia from hyperglycemia.  Clinically euvolemic.  Discontinued IV fluids  Hypertension: Controlled off of meds.  Hyperlipidemia: Not on meds PTA.  Body mass index is 26.16 kg/m.  Nutritional Status Nutrition Problem: Increased nutrient needs Etiology: acute illness Signs/Symptoms: estimated needs Interventions: Glucerna shake  Pressure Ulcer:    ACP Documents: None present DVT prophylaxis: SCD's Start: 01/09/23 1856 enoxaparin (LOVENOX) injection 40 mg Start: 01/08/23 2200     Code Status: Full Code:  Family Communication: Spouse via phone. Disposition:  Status is: Inpatient Remains inpatient appropriate because: S/p surgery, on IV antibiotics pending final  culture sensitivity results.     Consultants:   Orthopedics/Dr. Lajoyce Corners  Procedures:   As noted above.  Antimicrobials:   As noted above   Subjective:  Reports some appropriate postop pain and asking for pain  meds.  Rest of history as above.  No new complaints.  Objective:   Vitals:   01/10/23 0106 01/10/23 0453 01/10/23 0822 01/10/23 1037  BP: (!) 102/54 116/79 103/60 107/63  Pulse: 69 77 81 74  Resp:   16 16  Temp: 98.2 F (36.8 C) 97.8 F (36.6 C) 98.6 F (37 C) 98.1 F (36.7 C)  TempSrc: Oral Oral Oral Oral  SpO2: 97% 97% 96% 96%  Weight:      Height:        General exam: Middle-age male, moderately built and nourished sitting up comfortably at edge of bed. Respiratory system: Clear to auscultation. Respiratory effort normal. Cardiovascular system: S1 & S2 heard, RRR. No JVD, murmurs, rubs, gallops or clicks. No pedal edema. Gastrointestinal system: Abdomen is nondistended, soft and nontender. No organomegaly or masses felt. Normal bowel sounds heard. Central nervous system: Alert and oriented. No focal neurological deficits. Extremities: Symmetric 5 x 5 power.  Right BKA stump with wound VAC and stump shrinker in place.  Extensive scarring over the medial aspect of left knee/popliteal fossa. Skin: No rashes, lesions or ulcers Psychiatry: Judgement and insight appear normal. Mood & affect appropriate.     Data Reviewed:   I have personally reviewed following labs and imaging studies   CBC: Recent Labs  Lab 01/06/23 0126 01/06/23 0453 01/07/23 0114 01/09/23 0127 01/10/23 0036  WBC 10.7*   < > 11.1* 7.0 10.8*  NEUTROABS 6.4  --  8.2*  --   --   HGB 14.6   < > 13.6 14.0 13.9  HCT 43.5   < > 40.2 40.9 41.6  MCV 92.6   < > 91.0 92.3 93.9  PLT 241   < > 274 226 167   < > = values in this interval not displayed.    Basic Metabolic Panel: Recent Labs  Lab 01/06/23 0126 01/06/23 0453 01/07/23 0114 01/09/23 0127 01/10/23 0036  NA 131* 130* 134* 133* 131*  K 3.6 3.8 3.7 4.5 4.1  CL 97* 98 100 100 97*  CO2 24 25 26 26 25   GLUCOSE 334* 309* 187* 203* 223*  BUN 12 13 13 16 17   CREATININE 0.93 0.85 0.93 1.05 1.06  CALCIUM 8.4* 8.1* 8.1* 8.4* 8.4*  MG  --  1.9 1.9   --   --   PHOS  --  3.4  --   --   --     Liver Function Tests: Recent Labs  Lab 01/06/23 0126 01/07/23 0114  AST 12* 10*  ALT 12 11  ALKPHOS 82 66  BILITOT 0.4 0.4  PROT 6.7 6.4*  ALBUMIN 2.9* 2.7*    CBG: Recent Labs  Lab 01/10/23 0451 01/10/23 0816 01/10/23 1033  GLUCAP 160* 237* 192*    Microbiology Studies:   Recent Results (from the past 240 hour(s))  Culture, blood (Routine X 2) w Reflex to ID Panel     Status: None (Preliminary result)   Collection Time: 01/06/23  4:52 AM   Specimen: BLOOD RIGHT FOREARM  Result Value Ref Range Status   Specimen Description BLOOD RIGHT FOREARM  Final   Special Requests   Final    BOTTLES DRAWN AEROBIC AND ANAEROBIC Blood Culture adequate volume   Culture   Final  NO GROWTH 4 DAYS Performed at [facility], [address], [place], [place] [address]    Report Status PENDING  Incomplete  Culture, blood (Routine X 2) w Reflex to ID Panel     Status: None (Preliminary result)   Collection Time: 01/06/23  4:58 AM   Specimen: BLOOD  Result Value Ref Range Status   Specimen Description BLOOD RIGHT ANTECUBITAL  Final   Special Requests   Final    BOTTLES DRAWN AEROBIC AND ANAEROBIC Blood Culture adequate volume   Culture   Final    NO GROWTH 4 DAYS Performed at [facility], [address], [place], [place] [address]    Report Status PENDING  Incomplete  MRSA Next Gen by PCR, Nasal     Status: Abnormal   Collection Time: 01/06/23  7:35 AM   Specimen: Nasal Mucosa; Nasal Swab  Result Value Ref Range Status   MRSA by PCR Next Gen DETECTED (A) NOT DETECTED Final    Comment: RESULT CALLED TO, READ BACK BY AND VERIFIED WITH: 01/06/23 AT 1010 TO RN Adin Hector, ADC (NOTE) The GeneXpert MRSA Assay (FDA approved for NASAL specimens only), is one component of a comprehensive MRSA colonization surveillance program. It is not intended to diagnose MRSA infection nor to guide or monitor treatment for MRSA  infections. Test performance is not FDA approved in patients less than 77 years old. Performed at [facility], [address], [place], [place] [address]   Surgical PCR screen     Status: Abnormal   Collection Time: 01/08/23  8:44 PM   Specimen: Nasal Mucosa; Nasal Swab  Result Value Ref Range Status   MRSA, PCR POSITIVE (A) NEGATIVE Final    Comment: RESULT CALLED TO, READ BACK BY AND VERIFIED WITH: RN JOSEPHINE C ON 01/08/23 @ 2224 BY DRT    Staphylococcus aureus POSITIVE (A) NEGATIVE Final    Comment: (NOTE) The Xpert SA Assay (FDA approved for NASAL specimens in patients 61 years of age and older), is one component of a comprehensive surveillance program. It is not intended to diagnose infection nor to guide or monitor treatment. Performed at [facility], [address], [place], [place] [address]   Aerobic/Anaerobic Culture w Gram Stain (surgical/deep wound)     Status: None (Preliminary result)   Collection Time: 01/09/23  5:54 PM   Specimen: Leg, Right; Tissue  Result Value Ref Range Status   Specimen Description TISSUE RIGHT LEG  Final   Special Requests NONE  Final   Gram Stain   Final    FEW WBC PRESENT, PREDOMINANTLY MONONUCLEAR NO ORGANISMS SEEN    Culture   Final    NO GROWTH < 12 HOURS Performed at [facility], [address], [place], [place] [address]    Report Status PENDING  Incomplete    Radiology Studies:  No results found.  Scheduled Meds:    vitamin C  1,000 mg Oral Daily   docusate sodium  100 mg Oral Daily   enoxaparin (LOVENOX) injection  40 mg Subcutaneous Q24H   feeding supplement (GLUCERNA SHAKE)  237 mL Oral BID BM   gabapentin  600 mg Oral TID   insulin aspart  0-9 Units Subcutaneous Q4H   insulin aspart protamine- aspart  7 Units Subcutaneous BID WC   multivitamin with minerals  1 tablet Oral Daily   mupirocin ointment   Nasal BID   nicotine  14 mg Transdermal Daily  nutrition supplement (JUVEN)  1 packet Oral  BID BM   pantoprazole  40 mg Oral Daily   zinc sulfate  220 mg Oral Daily    Continuous Infusions:    sodium chloride     ceFEPime (MAXIPIME) IV 2 g (01/10/23 1235)   magnesium sulfate bolus IVPB     vancomycin 1,250 mg (01/10/23 0542)     LOS: 4 days     Marcellus Scott, MD,  FACP, FHM, SFHM, [facility], [facility]   [facility] [place]     To contact the attending provider between 7A-7P or the covering provider during after hours 7P-7A, please log into the web site [email or web address] and access using universal [place] password for that web site. If you do not have the password, please call the hospital operator.  01/10/2023, 1:00 PM

## 2023-01-10 NOTE — Progress Notes (Signed)
Inpatient Rehabilitation Admissions Coordinator   Rehab consult received per postop order. Noted no OT needs. We will sign off as no CIR needs.  Ottie Glazier, RN, MSN Rehab Admissions Coordinator [phone] 01/10/2023 11:34 AM

## 2023-01-10 NOTE — Evaluation (Signed)
Occupational Therapy Evaluation Patient Details Name: Cody Pena MRN: [id number] DOB: [birth year] Today's Date: 01/10/2023   History of Present Illness 62 y.o. male who presented to [facility] ED from home 01/06/23 due to right stump pain and MRI showed osteomyelitis of the distal tibia. 5/29 to OR for revision R BKA  PMHx:  Covid, anxiety/depression, uncontrolled DM, HTN, PVD, cervical spine fusion, peripheral neuropathy, OA, R BKA   Clinical Impression   Pt is s/p above diagnosis. Pt used prosthetic prior to admission, mod I for all activities. Pt states pain is 8/10, R surgical site feels swollen, nursing informed and checked RLE during session, also gave pain meds. Pt able to use RW with supervision, good safety awareness, strength, balance, able to transfer to w/c supervision. Pt states he needs a new w/c and RW, also wants to buy crutches, but will use [employer], did not want me to put in orders through hospital. Pt doing well, has help at home from daughter who is available 24/7, has ramped entrance to home, shower seat. Pt instructed on safety awareness with transfers and use of RW, feels confident about returning home, does not feel like he needs continued OT. Pt does not require continued or follow up OT.       Recommendations for follow up therapy are one component of a multi-disciplinary discharge planning process, led by the attending physician.  Recommendations may be updated based on patient status, additional functional criteria and insurance authorization.   Assistance Recommended at Discharge PRN  Patient can return home with the following A little help with walking and/or transfers;A little help with bathing/dressing/bathroom;Assistance with cooking/housework;Assist for transportation;Help with stairs or ramp for entrance    Functional Status Assessment  Patient has had a recent decline in their functional status and demonstrates the ability to make significant improvements in  function in a reasonable and predictable amount of time.  Equipment Recommendations  None recommended by OT (Pt states he wants to buy new w/c and crutches from [place])    Recommendations for Other Services       Precautions / Restrictions Precautions Precautions: Fall Restrictions Weight Bearing Restrictions: Yes RLE Weight Bearing: Non weight bearing      Mobility Bed Mobility Overal bed mobility: Modified Independent                  Transfers Overall transfer level: Needs assistance Equipment used: Rolling walker (2 wheels) Transfers: Sit to/from Stand, Bed to chair/wheelchair/BSC Sit to Stand: Supervision     Step pivot transfers: Supervision     General transfer comment: uses RW for transfer, supervision, good balance      Balance Overall balance assessment: Needs assistance Sitting-balance support: Feet supported Sitting balance-[name]: Normal     Standing balance support: During functional activity, Reliant on assistive device for balance, Single extremity supported Standing balance-[name]: Good Standing balance comment: good balance, able to stand with one hand on bed rail for support, no LOB.                           ADL either performed or assessed with clinical judgement   ADL Overall ADL's : Needs assistance/impaired Eating/Feeding: Independent   Grooming: Modified independent;Sitting   Upper Body Bathing: Modified independent   Lower Body Bathing: Modified independent   Upper Body Dressing : Modified independent   Lower Body Dressing: Modified independent   Toilet Transfer: Supervision/safety   Toileting- Clothing Manipulation and Hygiene: Modified  independent   Tub/ Engineer, structural: Supervision/safety   Functional mobility during ADLs: Supervision/safety General ADL Comments: supervision for transfers, mod I for ADLs     Vision Baseline Vision/History: 1 Wears glasses Ability to See in Adequate Light: 0  Adequate Patient Visual Report: No change from baseline       Perception     Praxis      Pertinent Vitals/Pain Pain Assessment Pain Assessment: 0-10 Pain Score: 8  Pain Location: RLE Pain Descriptors / Indicators: Aching, Constant, Jabbing Pain Intervention(s): Monitored during session, RN gave pain meds during session     Hand Dominance     Extremity/Trunk Assessment Upper Extremity Assessment Upper Extremity Assessment: Overall WFL for tasks assessed           Communication Communication Communication: No difficulties   Cognition Arousal/Alertness: Awake/alert Behavior During Therapy: WFL for tasks assessed/performed Overall Cognitive Status: Within Functional Limits for tasks assessed                                       General Comments       Exercises     Shoulder Instructions      Home Living Family/patient expects to be discharged to:: Private residence Living Arrangements: Children Available Help at Discharge: Family Type of Home: House Home Access: Ramped entrance     Home Layout: One level     Bathroom Shower/Tub: Chief Strategy Officer: Standard     Home Equipment: Agricultural consultant (2 wheels);Wheelchair - Sport and exercise psychologist Comments: Pt lives at home with daughter who is available 24/7 to assist if needed      Prior Functioning/Environment Prior Level of Function : Independent/Modified Independent;Driving             Mobility Comments: used prosthetic ADLs Comments: Ind with prosthetic        OT Problem List: Impaired balance (sitting and/or standing);Pain      OT Treatment/Interventions:      OT Goals(Current goals can be found in the care plan section) Acute Rehab OT Goals Patient Stated Goal: to return home and decrease pain OT Goal Formulation: With patient Time For Goal Achievement: 01/25/23 Potential to Achieve Goals: Good  OT Frequency:      Co-evaluation               AM-PAC OT "6 Clicks" Daily Activity     Outcome Measure Help from another person eating meals?: None Help from another person taking care of personal grooming?: None Help from another person toileting, which includes using toliet, bedpan, or urinal?: A Little Help from another person bathing (including washing, rinsing, drying)?: A Little Help from another person to put on and taking off regular upper body clothing?: None Help from another person to put on and taking off regular lower body clothing?: A Little 6 Click Score: 21   End of Session Equipment Utilized During Treatment: Gait belt;Rolling walker (2 wheels) Nurse Communication: Mobility status  Activity Tolerance: Patient tolerated treatment well Patient left: in bed;with call bell/phone within reach  OT Visit Diagnosis: Other abnormalities of gait and mobility (R26.89);Pain Pain - Right/Left: Right Pain - part of body: Leg                Time: 1026-1106 OT Time Calculation (min): 40 min Charges:  OT General Charges $OT Visit: 1 Visit OT Evaluation $OT Eval Low Complexity: 1  Low OT Treatments $Self Care/Home Management : 23-37 mins $Therapeutic Activity: 8-22 mins  Ellison Leisure, OTR/L   Alexis Goodell 01/10/2023, 11:16 AM

## 2023-01-11 ENCOUNTER — Encounter (HOSPITAL_COMMUNITY): Payer: Self-pay | Admitting: Orthopedic Surgery

## 2023-01-11 DIAGNOSIS — M869 Osteomyelitis, unspecified: Secondary | ICD-10-CM | POA: Diagnosis not present

## 2023-01-11 LAB — GLUCOSE, CAPILLARY
Glucose-Capillary: 123 mg/dL — ABNORMAL HIGH (ref 70–99)
Glucose-Capillary: 168 mg/dL — ABNORMAL HIGH (ref 70–99)
Glucose-Capillary: 173 mg/dL — ABNORMAL HIGH (ref 70–99)
Glucose-Capillary: 186 mg/dL — ABNORMAL HIGH (ref 70–99)
Glucose-Capillary: 186 mg/dL — ABNORMAL HIGH (ref 70–99)
Glucose-Capillary: 188 mg/dL — ABNORMAL HIGH (ref 70–99)

## 2023-01-11 LAB — CULTURE, BLOOD (ROUTINE X 2)
Culture: NO GROWTH
Culture: NO GROWTH
Special Requests: ADEQUATE
Special Requests: ADEQUATE

## 2023-01-11 MED ORDER — INSULIN ASPART PROT & ASPART (70-30 MIX) 100 UNIT/ML ~~LOC~~ SUSP
10.0000 [IU] | Freq: Two times a day (BID) | SUBCUTANEOUS | Status: DC
  Administered 2023-01-11 – 2023-01-12 (×2): 10 [IU] via SUBCUTANEOUS
  Filled 2023-01-11: qty 10

## 2023-01-11 MED ORDER — ACETAMINOPHEN 500 MG PO TABS
1000.0000 mg | ORAL_TABLET | Freq: Four times a day (QID) | ORAL | Status: DC
  Administered 2023-01-11 – 2023-01-12 (×6): 1000 mg via ORAL
  Filled 2023-01-11 (×6): qty 2

## 2023-01-11 MED ORDER — METHOCARBAMOL 500 MG PO TABS
500.0000 mg | ORAL_TABLET | Freq: Four times a day (QID) | ORAL | Status: DC
  Administered 2023-01-11 – 2023-01-12 (×6): 500 mg via ORAL
  Filled 2023-01-11 (×7): qty 1

## 2023-01-11 NOTE — Plan of Care (Signed)
  Problem: Nutritional: Goal: Maintenance of adequate nutrition will improve Outcome: Progressing   Problem: Education: Goal: Knowledge of General Education information will improve Description: Including pain rating scale, medication(s)/side effects and non-pharmacologic comfort measures Outcome: Progressing   Problem: Clinical Measurements: Goal: Respiratory complications will improve Outcome: Progressing   

## 2023-01-11 NOTE — Progress Notes (Signed)
PROGRESS NOTE   Cody Pena  ZOX:[id number]    DOB: 04-14-1961    DOA: 01/06/2023  PCP: [facility]   I have briefly reviewed patients previous medical records in [facility].  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  05/24 he was diagnosed with right stump infection and was placed on oral antibiotic therapy with clindamycin. CT right lower extremity showed  findings suggestive  of intra osseous abscess and associated osteomyelitis involving the right stump, that prompted a referral to the ED for further evaluation.  Admitted for right BKA amputation with osteomyelitis, abscess and cellulitis, orthopedics consulted s/p revision of right BKA by Dr. Lajoyce Corners 5/29.   Assessment & Plan:  Active Problems:   Osteomyelitis (HCC)   T2DM (type 2 diabetes mellitus) (HCC)   HTN (hypertension)   Hyperlipidemia   Right BKA stump osteomyelitis, abscess and cellulitis: - MRI right tibia/fibula 5/27: Prior right below-knee amputation with osteomyelitis of the distal tibia amputation stump and 1.3 cm intraosseous abscess. Soft tissue abscess adjacent to the tibial stump with anterior and posterior components. - Continue IV vancomycin and cefepime.  Blood cultures x 2: NTD.  MRSA PCR +. - Multimodality pain control - Orthopedics/Dr. Lajoyce Corners consulted and s/p revision of right BKA by Dr. Lajoyce Corners 5/29.  Operative tissue culture from right leg 5/29: Rare Staph aureus, rare group B strep, sensitivities pending.  Has Prevena wound VAC x 1 week.  Nonweightbearing on right. -Inadequate pain control, multimodality pain control (started scheduled Tylenol 1 g 4 times daily and Robaxin 500 Mg 3 times daily  in addition to as needed p.o./IV opioids) -Await final culture sensitivity results to determine p.o. antibiotic choices at discharge and duration.  Hopeful discharge on the weekend.  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 -Patient reports that he used to be on Lantus 20 units once a day and Humalog SSI in the past.  He was also ordered metformin but did not take it due to GI intolerance.  Mostly quit taking his insulins after his PCP retired and also lost his insurance.  Occasionally checks CBG and takes Humalog. -Patient reports that he met with the DM coordinator who discussed affordable insulin at approximately $44 a vial, suspect the [employer] brand which seems quite reasonable.  He states that he has just applied for Medicaid while he was in the hospital which may take several months. -Initiated 70/30 insulin, increased to 10 units twice daily with meals.  Continue sensitive NovoLog SSI without at bedtime cover.  Monitor closely and adjust insulins as needed.  Tobacco abuse Cessation counseled.  Continue nicotine patch.  Hyponatremia: Likely multifactorial, initially due to some dehydration and now due to pseudohyponatremia from hyperglycemia.  Clinically euvolemic.  Discontinued IV fluids  Hypertension: Controlled off of meds.  Hyperlipidemia: Not on meds PTA.  Body mass index is 26.16 kg/m.  Nutritional Status Nutrition Problem: Increased nutrient needs Etiology: acute illness Signs/Symptoms: estimated needs Interventions: Glucerna shake  Pressure Ulcer:    ACP Documents: None present DVT prophylaxis: SCD's Start: 01/09/23 1856 enoxaparin (LOVENOX) injection 40 mg Start: 01/08/23 2200     Code Status: Full Code:  Family Communication: None at  bedside Disposition:  Inpatient appropriate.  DC pending appropriate pain control and results of final culture sensitivity.     Consultants:   Orthopedics/Dr.  Lajoyce Corners  Procedures:   As noted above.  Antimicrobials:   As noted above   Subjective:  Patient reports significant pain in the right BKA stump, not controlled by current regimen.  Objective:   Vitals:   01/10/23 1641 01/10/23 2056 01/11/23 0414 01/11/23 0735  BP: (!) 113/58 131/72 126/64 108/66  Pulse: 86 82 89 79  Resp: 18 18 18 16   Temp: 97.9 F (36.6 C) 99.6 F (37.6 C) 99.2 F (37.3 C) 98.3 F (36.8 C)  TempSrc: Oral Oral Oral Oral  SpO2: 98% 95% 96% 94%  Weight:      Height:        General exam: Middle-age male, moderately built and nourished sitting up at edge of bed, appeared uncomfortable and in some pain. Respiratory system: Clear to auscultation.  No increased work of breathing. Cardiovascular system: S1 & S2 heard, RRR. No JVD, murmurs, rubs, gallops or clicks. No pedal edema. Gastrointestinal system: Abdomen is nondistended, soft and nontender. No organomegaly or masses felt. Normal bowel sounds heard. Central nervous system: Alert and oriented. No focal neurological deficits. Extremities: Symmetric 5 x 5 power.  Right BKA stump with wound VAC and stump shrinker in place, unchanged.  Extensive scarring over the medial aspect of left knee/popliteal fossa. Skin: No rashes, lesions or ulcers Psychiatry: Judgement and insight appear normal. Mood & affect appropriate.     Data Reviewed:   I have personally reviewed following labs and imaging studies   CBC: Recent Labs  Lab 01/06/23 0126 01/06/23 0453 01/07/23 0114 01/09/23 0127 01/10/23 0036  WBC 10.7*   < > 11.1* 7.0 10.8*  NEUTROABS 6.4  --  8.2*  --   --   HGB 14.6   < > 13.6 14.0 13.9  HCT 43.5   < > 40.2 40.9 41.6  MCV 92.6   < > 91.0 92.3 93.9  PLT 241   < > 274 226 167   < > = values in this interval not displayed.    Basic Metabolic Panel: Recent Labs  Lab 01/06/23 0126 01/06/23 0453 01/07/23 0114 01/09/23 0127 01/10/23 0036  NA 131* 130* 134* 133* 131*  K 3.6 3.8 3.7 4.5 4.1  CL  97* 98 100 100 97*  CO2 24 25 26 26 25   GLUCOSE 334* 309* 187* 203* 223*  BUN 12 13 13 16 17   CREATININE 0.93 0.85 0.93 1.05 1.06  CALCIUM 8.4* 8.1* 8.1* 8.4* 8.4*  MG  --  1.9 1.9  --   --   PHOS  --  3.4  --   --   --     Liver Function Tests: Recent Labs  Lab 01/06/23 0126 01/07/23 0114  AST 12* 10*  ALT 12 11  ALKPHOS 82 66  BILITOT 0.4 0.4  PROT 6.7 6.4*  ALBUMIN 2.9* 2.7*    CBG: Recent Labs  Lab 01/11/23 0407 01/11/23 0800 01/11/23 1153  GLUCAP 173* 186* 188*    Microbiology Studies:   Recent Results (from the past 240 hour(s))  Culture, blood (Routine X 2) w Reflex to ID Panel     Status: None   Collection Time: 01/06/23  4:52 AM   Specimen: BLOOD RIGHT FOREARM  Result Value Ref Range Status   Specimen Description BLOOD RIGHT FOREARM  Final   Special Requests   Final    BOTTLES  DRAWN AEROBIC AND ANAEROBIC Blood Culture adequate volume   Culture   Final    NO GROWTH 5 DAYS Performed at [facility], [address] [place], [place] [address]    Report Status 01/11/2023 FINAL  Final  Culture, blood (Routine X 2) w Reflex to ID Panel     Status: None   Collection Time: 01/06/23  4:58 AM   Specimen: BLOOD  Result Value Ref Range Status   Specimen Description BLOOD RIGHT ANTECUBITAL  Final   Special Requests   Final    BOTTLES DRAWN AEROBIC AND ANAEROBIC Blood Culture adequate volume   Culture   Final    NO GROWTH 5 DAYS Performed at [facility], [address], [place], [place] [address]    Report Status 01/11/2023 FINAL  Final  MRSA Next Gen by PCR, Nasal     Status: Abnormal   Collection Time: 01/06/23  7:35 AM   Specimen: Nasal Mucosa; Nasal Swab  Result Value Ref Range Status   MRSA by PCR Next Gen DETECTED (A) NOT DETECTED Final    Comment: RESULT CALLED TO, READ BACK BY AND VERIFIED WITH: 01/06/23 AT 1010 TO RN Adin Hector, ADC (NOTE) The GeneXpert MRSA Assay (FDA approved for NASAL specimens only), is one component of a  comprehensive MRSA colonization surveillance program. It is not intended to diagnose MRSA infection nor to guide or monitor treatment for MRSA infections. Test performance is not FDA approved in patients less than 74 years old. Performed at [facility], [address], [place], [place] [address]   Surgical PCR screen     Status: Abnormal   Collection Time: 01/08/23  8:44 PM   Specimen: Nasal Mucosa; Nasal Swab  Result Value Ref Range Status   MRSA, PCR POSITIVE (A) NEGATIVE Final    Comment: RESULT CALLED TO, READ BACK BY AND VERIFIED WITH: RN JOSEPHINE C ON 01/08/23 @ 2224 BY DRT    Staphylococcus aureus POSITIVE (A) NEGATIVE Final    Comment: (NOTE) The Xpert SA Assay (FDA approved for NASAL specimens in patients 71 years of age and older), is one component of a comprehensive surveillance program. It is not intended to diagnose infection nor to guide or monitor treatment. Performed at [facility], [address], [place], [place] [address]   Aerobic/Anaerobic Culture w Gram Stain (surgical/deep wound)     Status: None (Preliminary result)   Collection Time: 01/09/23  5:54 PM   Specimen: Leg, Right; Tissue  Result Value Ref Range Status   Specimen Description TISSUE RIGHT LEG  Final   Special Requests NONE  Final   Gram Stain   Final    FEW WBC PRESENT, PREDOMINANTLY MONONUCLEAR NO ORGANISMS SEEN Performed at [facility], [address], [place], [place] [address]    Culture   Final    RARE STAPHYLOCOCCUS AUREUS RARE GROUP B STREP(S.AGALACTIAE)ISOLATED SUSCEPTIBILITIES TO FOLLOW TESTING AGAINST S. AGALACTIAE NOT ROUTINELY PERFORMED DUE TO PREDICTABILITY OF AMP/PEN/VAN SUSCEPTIBILITY. NO ANAEROBES ISOLATED; CULTURE IN PROGRESS FOR 5 DAYS    Report Status PENDING  Incomplete    Radiology Studies:  No results found.  Scheduled Meds:    acetaminophen  1,000 mg Oral QID   vitamin C  1,000 mg Oral Daily   docusate sodium  100 mg Oral Daily    enoxaparin (LOVENOX) injection  40 mg Subcutaneous Q24H   feeding supplement (GLUCERNA SHAKE)  237 mL Oral BID BM   gabapentin  600 mg Oral TID   insulin aspart  0-9 Units Subcutaneous Q4H   insulin aspart protamine- aspart  10 Units Subcutaneous BID WC   methocarbamol  500 mg Oral QID   multivitamin with minerals  1 tablet Oral Daily   nicotine  14 mg Transdermal Daily   nutrition supplement (JUVEN)  1 packet Oral BID BM   pantoprazole  40 mg Oral Daily   zinc sulfate  220 mg Oral Daily    Continuous Infusions:    sodium chloride 75 mL/hr at 01/11/23 1258   ceFEPime (MAXIPIME) IV 2 g (01/11/23 1248)   magnesium sulfate bolus IVPB     vancomycin 1,250 mg (01/11/23 0409)     LOS: 5 days     Marcellus Scott, MD,  FACP, FHM, [facility], [facility], [facility]   [facility] [place]     To contact the attending provider between 7A-7P or the covering provider during after hours 7P-7A, please log into the web site [email or web address] and access using universal [place] password for that web site. If you do not have the password, please call the hospital operator.  01/11/2023, 2:03 PM

## 2023-01-12 ENCOUNTER — Other Ambulatory Visit (HOSPITAL_COMMUNITY): Payer: Self-pay

## 2023-01-12 DIAGNOSIS — M869 Osteomyelitis, unspecified: Secondary | ICD-10-CM | POA: Diagnosis not present

## 2023-01-12 LAB — AEROBIC/ANAEROBIC CULTURE W GRAM STAIN (SURGICAL/DEEP WOUND)

## 2023-01-12 LAB — CBC
HCT: 38.8 % — ABNORMAL LOW (ref 39.0–52.0)
Hemoglobin: 13 g/dL (ref 13.0–17.0)
MCH: 31.6 pg (ref 26.0–34.0)
MCHC: 33.5 g/dL (ref 30.0–36.0)
MCV: 94.2 fL (ref 80.0–100.0)
Platelets: 166 10*3/uL (ref 150–400)
RBC: 4.12 MIL/uL — ABNORMAL LOW (ref 4.22–5.81)
RDW: 13.2 % (ref 11.5–15.5)
WBC: 8.9 10*3/uL (ref 4.0–10.5)
nRBC: 0 % (ref 0.0–0.2)

## 2023-01-12 LAB — GLUCOSE, CAPILLARY
Glucose-Capillary: 205 mg/dL — ABNORMAL HIGH (ref 70–99)
Glucose-Capillary: 133 mg/dL — ABNORMAL HIGH (ref 70–99)
Glucose-Capillary: 152 mg/dL — ABNORMAL HIGH (ref 70–99)

## 2023-01-12 LAB — BASIC METABOLIC PANEL
Anion gap: 7 (ref 5–15)
BUN: 21 mg/dL (ref 8–23)
CO2: 27 mmol/L (ref 22–32)
Calcium: 8.5 mg/dL — ABNORMAL LOW (ref 8.9–10.3)
Chloride: 98 mmol/L (ref 98–111)
Creatinine, Ser: 0.92 mg/dL (ref 0.61–1.24)
GFR, Estimated: >60 mL/min (ref >60)
Glucose, Bld: 187 mg/dL — ABNORMAL HIGH (ref 70–99)
Potassium: 4.7 mmol/L (ref 3.5–5.1)
Sodium: 132 mmol/L — ABNORMAL LOW (ref 135–145)

## 2023-01-12 MED ORDER — INSULIN ASPART PROT & ASPART (70-30 MIX) 100 UNIT/ML PEN
10.0000 [IU] | PEN_INJECTOR | Freq: Two times a day (BID) | SUBCUTANEOUS | 0 refills | Status: DC
  Filled 2023-01-12: qty 6, 30d supply, fill #0

## 2023-01-12 MED ORDER — ADULT MULTIVITAMIN W/MINERALS CH: 1.0000 | ORAL_TABLET | Freq: Every day | ORAL | Status: DC

## 2023-01-12 MED ORDER — ZINC SULFATE 220 (50 ZN) MG PO TABS
220.0000 mg | ORAL_TABLET | Freq: Every day | ORAL | 0 refills | Status: AC
  Filled 2023-01-12: qty 14, 14d supply, fill #0

## 2023-01-12 MED ORDER — DOXYCYCLINE HYCLATE 100 MG PO TABS
100.0000 mg | ORAL_TABLET | Freq: Two times a day (BID) | ORAL | 0 refills | Status: AC
  Filled 2023-01-12: qty 24, 12d supply, fill #0

## 2023-01-12 MED ORDER — METHOCARBAMOL 500 MG PO TABS
500.0000 mg | ORAL_TABLET | Freq: Four times a day (QID) | ORAL | 0 refills | Status: DC | PRN
  Filled 2023-01-12: qty 30, 8d supply, fill #0

## 2023-01-12 MED ORDER — ACETAMINOPHEN 500 MG PO TABS: 1000.0000 mg | ORAL_TABLET | Freq: Four times a day (QID) | ORAL | Status: DC | PRN

## 2023-01-12 MED ORDER — OXYCODONE HCL 10 MG PO TABS
10.0000 mg | ORAL_TABLET | Freq: Four times a day (QID) | ORAL | 0 refills | Status: DC | PRN
  Filled 2023-01-12: qty 20, 5d supply, fill #0

## 2023-01-12 MED ORDER — AMOXICILLIN-POT CLAVULANATE 875-125 MG PO TABS
1.0000 | ORAL_TABLET | Freq: Two times a day (BID) | ORAL | 0 refills | Status: AC
  Filled 2023-01-12: qty 24, 12d supply, fill #0

## 2023-01-12 MED ORDER — DOCUSATE SODIUM 100 MG PO CAPS
100.0000 mg | ORAL_CAPSULE | Freq: Every day | ORAL | 0 refills | Status: DC
  Filled 2023-01-12: qty 10, 10d supply, fill #0

## 2023-01-12 MED ORDER — POLYETHYLENE GLYCOL 3350 17 GM/SCOOP PO POWD
17.0000 g | Freq: Every day | ORAL | 0 refills | Status: DC | PRN
  Filled 2023-01-12: qty 238, 14d supply, fill #0

## 2023-01-12 MED ORDER — INSULIN PEN NEEDLE 32G X 4 MM MISC
0 refills | Status: DC
  Filled 2023-01-12: qty 100, 50d supply, fill #0

## 2023-01-12 MED ORDER — NICOTINE 14 MG/24HR TD PT24
14.0000 mg | MEDICATED_PATCH | Freq: Every day | TRANSDERMAL | 0 refills | Status: DC
  Filled 2023-01-12: qty 28, 28d supply, fill #0

## 2023-01-12 MED ORDER — ASCORBIC ACID 1000 MG PO TABS: 1000.0000 mg | ORAL_TABLET | Freq: Every day | ORAL | Status: DC

## 2023-01-12 NOTE — Discharge Summary (Signed)
Physician Discharge Summary  Cody Pena ZOX:[id number] DOB: 02-Sep-1960  PCP: [facility]  Admitted from: Home Discharged to: Home  Admit date: 01/06/2023 Discharge date: 01/12/2023  Recommendations for Outpatient Follow-up:    Follow-up Information     Nadara Mustard, MD Follow up in 1 week(s).   Specialty: Orthopedic Surgery Contact information: [address] [place] [address] [phone]         [facility]. Schedule an appointment as soon as possible for a visit in 1 week(s).   Specialty: Family Medicine Why: To be seen with repeat labs (CBC & BMP).  Also to follow-up regarding management of poorly controlled diabetes mellitus. Contact information: [address] [place] [address] [phone]                  Home Health: None recommended by therapies evaluation    Equipment/Devices: Prevena wound VAC    Discharge Condition: Improved and stable   Code Status: Full Code Diet recommendation:  Discharge Diet Orders (From admission, onward)     Start     Ordered   01/12/23 0000  Diet - low sodium heart healthy        01/12/23 1333   01/12/23 0000  Diet Carb Modified        01/12/23 1333             Discharge Diagnoses:  Active Problems:   Osteomyelitis (HCC)   T2DM (type 2 diabetes mellitus) (HCC)   HTN (hypertension)   Hyperlipidemia   Brief Summary: 62 yo male, lives with his youngest daughter, ambulates with the help of prosthesis until recently, with the past medical history of T2DM, hypertension, dyslipidemia, and 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  05/24 he was diagnosed with right stump infection and was placed on oral antibiotic therapy with clindamycin. CT right lower extremity showed  findings suggestive  of intra osseous abscess and associated osteomyelitis involving the right stump, that prompted a referral to the ED for further  evaluation.  Admitted for right BKA amputation with osteomyelitis, abscess and cellulitis, orthopedics consulted s/p revision of right BKA by Dr. Lajoyce Corners 5/29.     Assessment & Plan:    Right BKA stump osteomyelitis, abscess and cellulitis: - MRI right tibia/fibula 5/27: Prior right below-knee amputation with osteomyelitis of the distal tibia amputation stump and 1.3 cm intraosseous abscess. Soft tissue abscess adjacent to the tibial stump with anterior and posterior components. -In the hospital treated with IV vancomycin and cefepime since admission 5/25.  Blood cultures x 2: NTD.  MRSA PCR +. - Multimodality pain control - Orthopedics/Dr. Lajoyce Corners consulted and s/p revision of right BKA by Dr. Lajoyce Corners 5/29.  Operative tissue culture from right leg 5/29: MRSA, rare group B strep.  Has Prevena wound VAC x 1 week.  Nonweightbearing on right. -Patient had inadequate pain control yesterday.  Multiple adjustments were made with better pain control.  Therapies evaluated and did not recommend any home health services.  Evaluated at bedside today along with Dr. Lajoyce Corners who has cleared patient for DC home on Prevena wound VAC and will follow-up in the office in 1 week time. -Wound cultures show rare MRSA + rare group B strep (strep agalactiae).  Reviewed case with infectious disease MD on call, MRSA resistant to Bactrim, patient has no insurance and would not be able to afford linezolid.  ID MD recommended combination of Augmentin + doxycycline, total 14 days  from day of surgery. -Reviewed with floor pharmacist who confirmed that patient has taken and tolerated Augmentin in the past.  Allergy listing for ceftriaxone noted but has tolerated cefepime and cefazolin without reaction.  Also patient indicates that he remembers taking Augmentin without issues. -All patient's meds being dispensed through [facility] pharmacy using the match letter assistance from [facility].  Also patient states that he has a follow-up appointment with a new  PCP on on 6/4 at 2:30 PM.   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 -Patient reports that he used to be on Lantus 20 units once a day and Humalog SSI in the past.  He was also ordered metformin but did not take it due to GI intolerance.  Mostly quit taking his insulins after his PCP retired and also lost his insurance.  Occasionally checks CBG and takes Humalog. -Patient reports that he met with the DM coordinator who discussed affordable insulin at approximately $44 a vial, suspect the [employer] brand which seems quite reasonable.  He states that he has just applied for Medicaid while he was in the hospital which may take several months. -Initiated 70/30 insulin, reasonably controlled on current 10 units twice daily, continue at discharge.  Patient able to get insulin pens from [facility] pharmacy at time of discharge.  Patient has a glucometer at home and is advised to monitor his CBGs closely and follow-up with PCP closely for further adjustments in medication doses.   Tobacco abuse Cessation counseled.  Continue nicotine patch.   Hyponatremia: Likely multifactorial, initially due to some dehydration and now due to pseudohyponatremia from hyperglycemia.  Clinically euvolemic.  Discontinued IV fluids   Hypertension: Controlled off of meds.   Hyperlipidemia: Not on meds PTA.  Will need to consider starting statins during outpatient follow-up with PCP.   Body mass index is 26.16 kg/m.   Nutritional Status Nutrition Problem: Increased nutrient needs Etiology: acute illness Signs/Symptoms: estimated needs Interventions: Glucerna shake    Consultants:   Orthopedics/Dr. Lajoyce Corners   Procedures:   As noted above.  Discharge Instructions  Discharge Instructions     Ambulatory Referral for Lung Cancer Scre   Complete by: As directed    Call MD for:  difficulty breathing, headache or visual disturbances   Complete by: As directed    Call MD  for:  extreme fatigue   Complete by: As directed    Call MD for:  persistant dizziness or light-headedness   Complete by: As directed    Call MD for:  persistant nausea and vomiting   Complete by: As directed    Call MD for:  redness, tenderness, or signs of infection (pain, swelling, redness, odor or green/yellow discharge around incision site)   Complete by: As directed    Call MD for:  severe uncontrolled pain   Complete by: As directed    Call MD for:  temperature >100.4   Complete by: As directed    Diet - low sodium heart healthy   Complete by: As directed    Diet Carb Modified   Complete by: As directed    Discharge wound care:   Complete by: As directed    Attache the wound VAC dressing to a [name] plus portable wound VAC pump for discharge.  Show patient how to plug the unit into maintain at discharge.   Increase activity slowly   Complete by: As directed    Negative Pressure Wound Therapy - Incisional  Complete by: As directed    Attached the wound VAC dressing to a [name] plus portable wound VAC pump for discharge.  Show patient how to plug the unit into maintain at discharge.  Pump may be ordered from central supplies or the operating room.        Medication List     STOP taking these medications    clindamycin 300 MG capsule Commonly known as: CLEOCIN       TAKE these medications    acetaminophen 500 MG tablet Commonly known as: TYLENOL Take 2 tablets (1,000 mg total) by mouth every 6 (six) hours as needed for mild pain or fever (For the first 3 days, may take 2 tabs (1000 mg total) 4 times daily scheduled following which can take 2 tabs (1000 mg total) 4 times daily as needed.). What changed:  medication strength how much to take when to take this reasons to take this   amoxicillin-clavulanate 875-125 MG tablet Commonly known as: AUGMENTIN Take 1 tablet by mouth 2 (two) times daily for 12 days.   ascorbic acid 1000 MG tablet Commonly known  as: VITAMIN C Take 1 tablet (1,000 mg total) by mouth daily. Start taking on: January 13, 2023   docusate sodium 100 MG capsule Commonly known as: COLACE Take 1 capsule (100 mg total) by mouth daily. Start taking on: January 13, 2023   doxycycline 100 MG tablet Commonly known as: VIBRA-TABS Take 1 tablet (100 mg total) by mouth 2 (two) times daily for 12 days.   gabapentin 600 MG tablet Commonly known as: NEURONTIN Take 1 tablet (600 mg total) by mouth 3 (three) times daily.   insulin aspart protamine - aspart (70-30) 100 UNIT/ML FlexPen Commonly known as: NOVOLOG 70/30 MIX Inject 10 Units into the skin 2 (two) times daily with a meal.   Insulin Pen Needle 32G X 4 MM Misc Use as directed two times daily.   methocarbamol 500 MG tablet Commonly known as: ROBAXIN Take 1 tablet (500 mg total) by mouth every 6 (six) hours as needed for muscle spasms.   multivitamin with minerals Tabs tablet Take 1 tablet by mouth daily. Start taking on: January 13, 2023   nicotine 14 mg/24hr patch Commonly known as: NICODERM CQ - dosed in mg/24 hours Place 1 patch (14 mg total) onto the skin daily. Start taking on: January 13, 2023   Oxycodone HCl 10 MG Tabs Take 1 tablet (10 mg total) by mouth every 6 (six) hours as needed for moderate pain or severe pain.   polyethylene glycol powder 17 GM/SCOOP powder Commonly known as: GLYCOLAX/MIRALAX Take 17 g by mouth daily as needed for mild constipation.   Zinc Sulfate 220 (50 Zn) MG Tabs Take 1 tablet (220 mg total) by mouth daily for 14 days. Start taking on: January 13, 2023       Allergies  Allergen Reactions   Ceftriaxone Itching, Nausea And Vomiting and Rash    Noted minutes after starting medication No rash noted with Ancef on 01-31-2022 during general anesthesia Has received cefepime and cefazolin without reaction      Procedures/Studies: MR TIBIA FIBULA RIGHT WO CONTRAST  Result Date: 01/07/2023 CLINICAL DATA:  Right below-knee amputation stump  infection. Outside facility CT suspicious for osteomyelitis with intraosseous abscess. EXAM: MRI OF LOWER RIGHT EXTREMITY WITHOUT CONTRAST TECHNIQUE: Multiplanar, multisequence MR imaging of the right lower leg was performed. No intravenous contrast was administered. COMPARISON:  Right tibia and fibula x-rays dated January 17, 2022. FINDINGS: Bones/Joint/Cartilage  Prior right below-knee amputation. Abnormal marrow edema and periosteal reaction involving the distal tibia amputation stump with 1.2 x 0.8 x 1.3 cm intraosseous fluid collection, consistent with osteomyelitis and abscess. No fracture or dislocation. Tricompartmental osteoarthritis of the right knee. No joint effusion. Muscles and Tendons Postsurgical changes with denervation and mild atrophy of the proximal lower leg muscles. Soft tissue Diffuse soft tissue swelling of the amputation stump. 1.2 x 3.0 x 1.9 cm fluid collection along the anterior aspect of the tibial stump. 3.6 x 2.4 x 2.1 cm fluid collection along the posterior aspect of the tibial stump. Presumably these communicate (series 8, image 44). IMPRESSION: 1. Prior right below-knee amputation with osteomyelitis of the distal tibia amputation stump and 1.3 cm intraosseous abscess. 2. Soft tissue abscess adjacent to the tibial stump with anterior and posterior components. Electronically Signed   By: Obie Dredge M.D.   On: 01/07/2023 09:12      Subjective: Interviewed and examined along with Dr. Lajoyce Corners in the room.  Patient reports pain control is better compared to yesterday but had some pain overnight and swelling.  Dr. Lajoyce Corners advised that the wound VAC and the stump shrinker are to try and minimize the swelling and related pain.  Patient feels comfortable discharging home today.  Discharge Exam:  Vitals:   01/11/23 1548 01/11/23 2020 01/12/23 0500 01/12/23 0720  BP: 115/70 120/68 (!) 102/59 106/63  Pulse: 72 72 70 68  Resp: 16 18 18 16   Temp: 98.1 F (36.7 C) 97.9 F (36.6 C) 97.9 F  (36.6 C) 98 F (36.7 C)  TempSrc: Oral Oral Oral Oral  SpO2: 98% 96% 97% 99%  Weight:      Height:        General exam: Middle-age male, moderately built and nourished sitting up at edge of bed, appears vertebral and improved compared to yesterday. Respiratory system: Clear to auscultation.  No increased work of breathing. Cardiovascular system: S1 & S2 heard, RRR. No JVD, murmurs, rubs, gallops or clicks. No pedal edema. Gastrointestinal system: Abdomen is nondistended, soft and nontender. No organomegaly or masses felt. Normal bowel sounds heard. Central nervous system: Alert and oriented. No focal neurological deficits. Extremities: Symmetric 5 x 5 power.  Right BKA stump with wound VAC and stump shrinker in place, unchanged without acute findings.  Extensive scarring over the medial aspect of left knee/popliteal fossa. Skin: No rashes, lesions or ulcers Psychiatry: Judgement and insight appear normal. Mood & affect appropriate.     The results of significant diagnostics from this hospitalization (including imaging, microbiology, ancillary and laboratory) are listed below for reference.     Microbiology: Recent Results (from the past 240 hour(s))  Culture, blood (Routine X 2) w Reflex to ID Panel     Status: None   Collection Time: 01/06/23  4:52 AM   Specimen: BLOOD RIGHT FOREARM  Result Value Ref Range Status   Specimen Description BLOOD RIGHT FOREARM  Final   Special Requests   Final    BOTTLES DRAWN AEROBIC AND ANAEROBIC Blood Culture adequate volume   Culture   Final    NO GROWTH 5 DAYS Performed at [facility], [address] [place], [place] [address]    Report Status 01/11/2023 FINAL  Final  Culture, blood (Routine X 2) w Reflex to ID Panel     Status: None   Collection Time: 01/06/23  4:58 AM   Specimen: BLOOD  Result Value Ref Range Status   Specimen Description BLOOD RIGHT ANTECUBITAL  Final   Special Requests   Final    BOTTLES DRAWN AEROBIC AND  ANAEROBIC Blood Culture adequate volume   Culture   Final    NO GROWTH 5 DAYS Performed at [facility], [address] [place], [place] [address]    Report Status 01/11/2023 FINAL  Final  MRSA Next Gen by PCR, Nasal     Status: Abnormal   Collection Time: 01/06/23  7:35 AM   Specimen: Nasal Mucosa; Nasal Swab  Result Value Ref Range Status   MRSA by PCR Next Gen DETECTED (A) NOT DETECTED Final    Comment: RESULT CALLED TO, READ BACK BY AND VERIFIED WITH: 01/06/23 AT 1010 TO RN Adin Hector, ADC (NOTE) The GeneXpert MRSA Assay (FDA approved for NASAL specimens only), is one component of a comprehensive MRSA colonization surveillance program. It is not intended to diagnose MRSA infection nor to guide or monitor treatment for MRSA infections. Test performance is not FDA approved in patients less than 61 years old. Performed at [facility], [address], [place], [place] [address]   Surgical PCR screen     Status: Abnormal   Collection Time: 01/08/23  8:44 PM   Specimen: Nasal Mucosa; Nasal Swab  Result Value Ref Range Status   MRSA, PCR POSITIVE (A) NEGATIVE Final    Comment: RESULT CALLED TO, READ BACK BY AND VERIFIED WITH: RN JOSEPHINE C ON 01/08/23 @ 2224 BY DRT    Staphylococcus aureus POSITIVE (A) NEGATIVE Final    Comment: (NOTE) The Xpert SA Assay (FDA approved for NASAL specimens in patients 59 years of age and older), is one component of a comprehensive surveillance program. It is not intended to diagnose infection nor to guide or monitor treatment. Performed at [facility], [address], [place], [place] [address]   Aerobic/Anaerobic Culture w Gram Stain (surgical/deep wound)     Status: None (Preliminary result)   Collection Time: 01/09/23  5:54 PM   Specimen: Leg, Right; Tissue  Result Value Ref Range Status   Specimen Description TISSUE RIGHT LEG  Final   Special Requests NONE  Final   Gram Stain   Final    FEW WBC PRESENT, PREDOMINANTLY  MONONUCLEAR NO ORGANISMS SEEN Performed at [facility], [address] [place], [place] [address]    Culture   Final    RARE METHICILLIN RESISTANT STAPHYLOCOCCUS AUREUS RARE GROUP B STREP(S.AGALACTIAE)ISOLATED TESTING AGAINST S. AGALACTIAE NOT ROUTINELY PERFORMED DUE TO PREDICTABILITY OF AMP/PEN/VAN SUSCEPTIBILITY. NO ANAEROBES ISOLATED; CULTURE IN PROGRESS FOR 5 DAYS    Report Status PENDING  Incomplete   Organism ID, Bacteria METHICILLIN RESISTANT STAPHYLOCOCCUS AUREUS  Final      Susceptibility   Methicillin resistant staphylococcus aureus - MIC*    CIPROFLOXACIN >=8 RESISTANT Resistant     ERYTHROMYCIN >=8 RESISTANT Resistant     GENTAMICIN <=0.5 SENSITIVE Sensitive     OXACILLIN >=4 RESISTANT Resistant     TETRACYCLINE <=1 SENSITIVE Sensitive     VANCOMYCIN <=0.5 SENSITIVE Sensitive     TRIMETH/SULFA >=320 RESISTANT Resistant     CLINDAMYCIN <=0.25 SENSITIVE Sensitive     RIFAMPIN <=0.5 SENSITIVE Sensitive     Inducible Clindamycin NEGATIVE Sensitive     LINEZOLID 2 SENSITIVE Sensitive     * RARE METHICILLIN RESISTANT STAPHYLOCOCCUS AUREUS     Labs: CBC: Recent Labs  Lab 01/06/23 0126 01/06/23 0453 01/07/23 0114 01/09/23 0127 01/10/23 0036 01/12/23 0035  WBC 10.7* 10.2 11.1* 7.0 10.8* 8.9  NEUTROABS 6.4  --  8.2*  --   --   --   HGB 14.6 14.2 13.6 14.0 13.9 13.0  HCT 43.5 41.8 40.2 40.9 41.6 38.8*  MCV 92.6 93.1 91.0 92.3 93.9 94.2  PLT 241 227 274 226 167 166    Basic Metabolic Panel: Recent Labs  Lab 01/06/23 0453 01/07/23 0114 01/09/23 0127 01/10/23 0036 01/12/23 0035  NA 130* 134* 133* 131* 132*  K 3.8 3.7 4.5 4.1 4.7  CL 98 100 100 97* 98  CO2 25 26 26 25 27   GLUCOSE 309* 187* 203* 223* 187*  BUN 13 13 16 17 21   CREATININE 0.85 0.93 1.05 1.06 0.92  CALCIUM 8.1* 8.1* 8.4* 8.4* 8.5*  MG 1.9 1.9  --   --   --   PHOS 3.4  --   --   --   --     Liver Function Tests: Recent Labs  Lab 01/06/23 0126 01/07/23 0114  AST 12* 10*  ALT 12  11  ALKPHOS 82 66  BILITOT 0.4 0.4  PROT 6.7 6.4*  ALBUMIN 2.9* 2.7*    CBG: Recent Labs  Lab 01/11/23 1735 01/11/23 2107 01/12/23 0118 01/12/23 0758 01/12/23 1206  GLUCAP 186* 168* 205* 133* 152*     Urinalysis    Component Value Date/Time   COLORURINE YELLOW 12/25/2021 2240   APPEARANCEUR CLEAR 12/25/2021 2240   LABSPEC 1.015 12/25/2021 2240   PHURINE 6.0 12/25/2021 2240   GLUCOSEU >=500 (A) 12/25/2021 2240   HGBUR NEGATIVE 12/25/2021 2240   BILIRUBINUR NEGATIVE 12/25/2021 2240   KETONESUR NEGATIVE 12/25/2021 2240   PROTEINUR NEGATIVE 12/25/2021 2240   UROBILINOGEN 1.0 12/07/2014 0325   NITRITE NEGATIVE 12/25/2021 2240   LEUKOCYTESUR NEGATIVE 12/25/2021 2240      Time coordinating discharge: 40 minutes  SIGNED:  Marcellus Scott, MD,  FACP, FHM, SFHM, [facility], [facility]   [facility] Physician Advisor [place]     To contact the attending provider between 7A-7P or the covering provider during after hours 7P-7A, please log into the web site [email or web address] and access using universal [place] password for that web site. If you do not have the password, please call the hospital operator.

## 2023-01-12 NOTE — Progress Notes (Signed)
Patient ID: Cody Pena, male   DOB: January 26, 1961, 62 y.o.   MRN: [id number] Patient is status post transtibial amputation.  There is 250 cc in the wound VAC canister.  Anticipate discharge soon.  I will place an order for a  [name] plus portable wound VAC pump.  Patient will discharge with the portable pump.  I will follow-up in the office in 1 week.

## 2023-01-12 NOTE — Progress Notes (Signed)
Pharmacy Antibiotic Note  Cody Pena is a 62 y.o. male admitted on 01/06/2023 with concern for osteomyelitis. Pharmacy has been consulted for Cefepime and Vancomycin dosing.  POD3 revision of right BKA (5/29). Afebrile, WBC wnl, renal function stable. Cultures/sensitivities pending - per MD, awaiting final results to determine selection and duration of oral antibiotics.   Plan: Cefepime 2 g IV q8h Vancomycin 1250 mg IV q12h (Goal AUC 400-550. eAUC 458, SCr used 1.05) Monitor renal function, cultures, clinical course  Follow up plans to transition from IV to PO antibiotics   Height: 6\' 4"  (193 cm) Weight: 97.5 kg (214 lb 15.2 oz) IBW/kg (Calculated) : 86.8  Temp (24hrs), Avg:98 F (36.7 C), Min:97.9 F (36.6 C), Max:98.1 F (36.7 C)  Recent Labs  Lab 01/06/23 0126 01/06/23 0320 01/06/23 0453 01/07/23 0114 01/09/23 0127 01/10/23 0036 01/12/23 0035  WBC 10.7*  --  10.2 11.1* 7.0 10.8* 8.9  CREATININE 0.93  --  0.85 0.93 1.05 1.06 0.92  LATICACIDVEN 2.1* 1.3  --   --   --   --   --     Estimated Creatinine Clearance: 100.9 mL/min (by C-G formula based on SCr of 0.92 mg/dL).    Allergies  Allergen Reactions   Ceftriaxone Itching, Nausea And Vomiting and Rash    Noted minutes after starting medication No rash noted with Ancef on 01-31-2022 during general anesthesia Has received cefepime and cefazolin without reaction    Antimicrobials this admission: Cefepime 5/26 >>  Vancomycin 5/26 >>  Flagyl 5/26 >> 5/28  Dose adjustments this admission: Vancomycin 1500 mg IV q12h >> 1250 mg IV q12h  Microbiology results: 5/26 BCx: NGTD 5/26 MRSA PCR: positive 5/29 R leg tissue Cx: pending (preliminary results with S. Aureus, Group B Strep)   Thank you for allowing pharmacy to be a part of this patient's care.  Jerrilyn Cairo, PharmD PGY-2 Pharmacy Resident Phone [phone] 01/12/2023 9:55 AM   Please check AMION for all [facility] Pharmacy phone numbers After 10:00 PM, call Main  Pharmacy [phone]

## 2023-01-12 NOTE — Discharge Instructions (Signed)

## 2023-01-12 NOTE — Progress Notes (Signed)
Assisted pt with his prescriptions through the [facility] program. Pt has a W/C, but he reports he needs a new one. Informed pt that [facility] can assist with the W/C. He reports he can get a W/C through [place] that is not too expensive.

## 2023-01-13 NOTE — Anesthesia Postprocedure Evaluation (Signed)
Anesthesia Post Note  Patient: Cody Pena  Procedure(s) Performed: REVISION RIGHT BELOW KNEE AMPUTATION (Right)     Patient location during evaluation: PACU Anesthesia Type: General Level of consciousness: awake Pain management: pain level controlled Vital Signs Assessment: post-procedure vital signs reviewed and stable Respiratory status: spontaneous breathing Cardiovascular status: stable Postop Assessment: no apparent nausea or vomiting Anesthetic complications: no  There were no known notable events for this encounter.  Last Vitals:  Vitals:   01/12/23 0500 01/12/23 0720  BP: (!) 102/59 106/63  Pulse: 70 68  Resp: 18 16  Temp: 36.6 C 36.7 C  SpO2: 97% 99%    Last Pain:  Vitals:   01/12/23 1340  TempSrc:   PainSc: 7                  John F [address]

## 2023-01-13 NOTE — Anesthesia Preprocedure Evaluation (Signed)
Anesthesia Evaluation  Patient identified by MRN, date of birth, ID band Patient awake    Reviewed: Allergy & Precautions, NPO status , Patient's Chart, lab work & pertinent test results  Airway Mallampati: II       Dental no notable dental hx.    Pulmonary Current Smoker and Patient abstained from smoking.   Pulmonary exam normal        Cardiovascular hypertension, Normal cardiovascular exam Rhythm:Regular Rate:Normal     Neuro/Psych    GI/Hepatic   Endo/Other  diabetes    Renal/GU      Musculoskeletal   Abdominal Normal abdominal exam  (+)   Peds  Hematology   Anesthesia Other Findings   Reproductive/Obstetrics                             Anesthesia Physical Anesthesia Plan  ASA: 2  Anesthesia Plan: General   Post-op Pain Management:    Induction: Intravenous  PONV Risk Score and Plan: 1 and Ondansetron and Propofol infusion  Airway Management Planned: LMA  Additional Equipment: None  Intra-op Plan:   Post-operative Plan: Extubation in OR  Informed Consent: I have reviewed the patients History and Physical, chart, labs and discussed the procedure including the risks, benefits and alternatives for the proposed anesthesia with the patient or authorized representative who has indicated his/her understanding and acceptance.     Dental advisory given  Plan Discussed with: CRNA  Anesthesia Plan Comments:        Anesthesia Quick Evaluation

## 2023-01-14 LAB — AEROBIC/ANAEROBIC CULTURE W GRAM STAIN (SURGICAL/DEEP WOUND)

## 2023-01-18 ENCOUNTER — Encounter: Payer: Self-pay | Admitting: Family

## 2023-01-18 ENCOUNTER — Telehealth: Payer: Self-pay | Admitting: Family

## 2023-01-18 ENCOUNTER — Ambulatory Visit (INDEPENDENT_AMBULATORY_CARE_PROVIDER_SITE_OTHER): Payer: Medicaid Other | Admitting: Family

## 2023-01-18 ENCOUNTER — Other Ambulatory Visit: Payer: Self-pay | Admitting: Orthopedic Surgery

## 2023-01-18 DIAGNOSIS — S88111D Complete traumatic amputation at level between knee and ankle, right lower leg, subsequent encounter: Secondary | ICD-10-CM

## 2023-01-18 MED ORDER — OXYCODONE HCL 10 MG PO TABS: 10.0000 mg | ORAL_TABLET | Freq: Four times a day (QID) | ORAL | 0 refills | Status: DC | PRN

## 2023-01-18 NOTE — Progress Notes (Signed)
Post-Op Visit Note   Patient: Cody Pena           Date of Birth: 1961-05-19           MRN: [id number] Visit Date: 01/18/2023 PCP: [facility]  Chief Complaint: No chief complaint on file.   HPI:  HPI The patient is a 62 year old gentleman seen status post revision below-knee amputation May 29 of this year he has been doing quite well wound VAC removed today.  Patient is a new yesright transtibial  amputee.  Patient's current comorbidities are not expected to impact the ability to function with the prescribed prosthesis. Patient verbally communicates a strong desire to use a prosthesis. Patient currently requires mobility aids to ambulate without a prosthesis.  Expects not to use mobility aids with a new prosthesis.  Patient is a K3 level ambulator that spends a lot of time walking around on uneven terrain over obstacles, up and down stairs, and ambulates with a variable cadence.    Ortho Exam On examination of the right residual limb this is well consolidated incision well-approximated sutures is clean dry and intact.  Visit Diagnoses: No diagnosis found.  Plan: Begin daily Dial soap cleansing dry dressings follow-up in 2 weeks for suture removal Follow-Up Instructions: No follow-ups on file.   Imaging: No results found.  Orders:  No orders of the defined types were placed in this encounter.  No orders of the defined types were placed in this encounter.    PMFS History: Patient Active Problem List   Diagnosis Date Noted   Malnutrition of moderate degree 12/28/2021   Abscess of right lower leg    Wound infection 12/26/2021   S/P BKA (below knee amputation), right (HCC) 09/30/2021   Below-knee amputation of right lower extremity (HCC) 09/27/2021   Dehiscence of amputation stump of right lower extremity (HCC)    Wound infection after surgery 09/16/2021   Cellulitis of right lower extremity    Peripheral vascular disease (HCC)    History of  transmetatarsal amputation of right foot (HCC) 08/25/2021   Osteomyelitis (HCC) 07/24/2021   HTN (hypertension) 07/23/2021   Hyperlipidemia 07/23/2021   Diabetic foot ulcers (HCC) 07/23/2021   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) 01/14/2018   Ulcer of left foot, limited to breakdown of skin (HCC) 01/14/2018   Foot osteomyelitis, left (HCC)    Septic arthritis of foot (HCC) 11/11/2017   Osteomyelitis of ankle or foot, acute, left (HCC) 11/11/2017   Type 2 DM with diabetic peripheral angiopathy w/o gangrene (HCC) 11/11/2017   Cigarette smoker 11/11/2017   Diabetic peripheral neuropathy (HCC) 11/11/2017   Diabetic foot infection (HCC) 11/10/2017   Post op infection    Delirium 12/07/2014   Cervical vertebral fusion 12/07/2014   Hallucination 12/07/2014   T2DM (type 2 diabetes mellitus) (HCC) 12/07/2014   Mood disorder (HCC) 12/07/2014   Pyrexia    Neck swelling    Past Medical History:  Diagnosis Date   Arthritis    COVID    has had Covid 3 times, not sick with it   COVID    has had 3 times mild to no symptoms   Depression    Diabetes mellitus without complication (HCC)    pt states he has now been told that he is a type 1 diabetic.   Dyspnea    albuterol inhaler   HLD (hyperlipidemia)    Hypertension    Peripheral vascular disease (HCC)  History reviewed. No pertinent family history.  Past Surgical History:  Procedure Laterality Date   ABDOMINAL AORTOGRAM W/LOWER EXTREMITY N/A 08/11/2021   Procedure: ABDOMINAL AORTOGRAM W/LOWER EXTREMITY;  Surgeon: Leonie Douglas, MD;  [facility];  Service: Cardiovascular;  Laterality: N/A;   AMPUTATION Left 11/15/2017   Procedure: LEFT FOOT 2ND RAY AMPUTATION;  Surgeon: Nadara Mustard, MD;  Location: [facility] OR;  Service: Orthopedics;  Laterality: Left;   AMPUTATION Right 08/25/2021   Procedure: RIGHT TRANSMETATARSAL AMPUTATION;  Surgeon: Nadara Mustard, MD;  Location: [facility] OR;  Service:  Orthopedics;  Laterality: Right;   AMPUTATION Right 09/27/2021   Procedure: BELOW KNEE AMPUTATION;  Surgeon: Nadara Mustard, MD;  Location: [facility] OR;  Service: Orthopedics;  Laterality: Right;   APPLICATION OF WOUND VAC  12/27/2021   Procedure: APPLICATION OF WOUND VAC;  Surgeon: Nadara Mustard, MD;  Location: [facility] OR;  Service: Orthopedics;;   CERVICAL FUSION     I & D EXTREMITY Left 12/27/2021   Procedure: LEFT LEG DEBRIDEMENT ULCERS;  Surgeon: Nadara Mustard, MD;  Location: [facility] OR;  Service: Orthopedics;  Laterality: Left;   I & D EXTREMITY Left 01/31/2022   Procedure: LEFT KNEE DEBRIDEMENT AND APPLY TISSUE GRAFT;  Surgeon: Nadara Mustard, MD;  Location: [facility] OR;  Service: Orthopedics;  Laterality: Left;   IR ANGIOGRAM EXTREMITY LEFT  11/14/2017   IR ANGIOGRAM SELECTIVE EACH ADDITIONAL VESSEL  11/14/2017   IR ANGIOGRAM SELECTIVE EACH ADDITIONAL VESSEL  11/14/2017   IR TIB-PERO ART PTA MOD SED  11/14/2017   IR TIB-PERO ART UNI PTA EA ADD VESSEL MOD SED  11/14/2017   IR US GUIDE VASC ACCESS LEFT  11/14/2017   PERIPHERAL VASCULAR INTERVENTION Right 08/11/2021   Procedure: PERIPHERAL VASCULAR INTERVENTION;  Surgeon: Leonie Douglas, MD;  [facility];  Service: Cardiovascular;  Laterality: Right;  rt sfa stent   STUMP REVISION Right 01/09/2023   Procedure: REVISION RIGHT BELOW KNEE AMPUTATION;  Surgeon: Nadara Mustard, MD;  Location: [facility] OR;  Service: Orthopedics;  Laterality: Right;   Social History   Occupational History   Not on file  Tobacco Use   Smoking status: Every Day    Packs/day: 0.50    Years: 47.00    Additional pack years: 0.00    Total pack years: 23.50    Types: Cigarettes   Smokeless tobacco: Never  Vaping Use   Vaping Use: Some days  Substance and Sexual Activity   Alcohol use: No   Drug use: No   Sexual activity: Not on file

## 2023-01-18 NOTE — Telephone Encounter (Signed)
Patient called in stating Cody Pena told him she was going to send him in some pain meds but there are no notes on it post revision below-knee amputation May 29 please advise

## 2023-01-30 ENCOUNTER — Other Ambulatory Visit (HOSPITAL_COMMUNITY): Payer: Self-pay

## 2023-01-30 ENCOUNTER — Other Ambulatory Visit: Payer: Self-pay | Admitting: Orthopedic Surgery

## 2023-01-30 MED ORDER — OXYCODONE HCL 10 MG PO TABS: 10.0000 mg | ORAL_TABLET | Freq: Three times a day (TID) | ORAL | 0 refills | Status: DC | PRN

## 2023-01-31 ENCOUNTER — Ambulatory Visit (INDEPENDENT_AMBULATORY_CARE_PROVIDER_SITE_OTHER): Payer: Medicaid Other | Admitting: Orthopedic Surgery

## 2023-01-31 DIAGNOSIS — Z89511 Acquired absence of right leg below knee: Secondary | ICD-10-CM

## 2023-01-31 DIAGNOSIS — S88111D Complete traumatic amputation at level between knee and ankle, right lower leg, subsequent encounter: Secondary | ICD-10-CM

## 2023-02-04 ENCOUNTER — Encounter: Payer: Self-pay | Admitting: Orthopedic Surgery

## 2023-02-04 ENCOUNTER — Telehealth: Payer: Self-pay | Admitting: Orthopedic Surgery

## 2023-02-04 ENCOUNTER — Ambulatory Visit (INDEPENDENT_AMBULATORY_CARE_PROVIDER_SITE_OTHER): Payer: Medicaid Other | Admitting: Orthopedic Surgery

## 2023-02-04 DIAGNOSIS — T8781 Dehiscence of amputation stump: Secondary | ICD-10-CM

## 2023-02-04 DIAGNOSIS — S88111D Complete traumatic amputation at level between knee and ankle, right lower leg, subsequent encounter: Secondary | ICD-10-CM

## 2023-02-04 DIAGNOSIS — Z89511 Acquired absence of right leg below knee: Secondary | ICD-10-CM

## 2023-02-04 NOTE — Telephone Encounter (Signed)
Called pt earlier and he is coming in today at 3:45

## 2023-02-04 NOTE — Progress Notes (Signed)
Office Visit Note   Patient: Cody Pena           Date of Birth: 09/07/1960           MRN: [id number] Visit Date: 02/04/2023              Requested by: Healthcare, [facility] [address] [place],  [place] [address] PCP: [facility], [facility]  Chief Complaint  Patient presents with   Right Leg - Routine Post Op    01/09/2023 revision right BKA      HPI: Patient is a 62 year old gentleman who is 4 weeks status post revision right transtibial amputation.  Sutures were removed at 3 weeks after surgery.  Patient states that he noticed the wound opened up he thinks may be from blunt trauma.  Assessment & Plan: Visit Diagnoses:  1. Below-knee amputation of right lower extremity, subsequent encounter (HCC)   2. Dehiscence of amputation stump of right lower extremity (HCC)     Plan: Patient will start Dial soap cleansing dry dressing changes with an Ace wrap and the stump shrinker on top.  The wound is healthy with good granulation tissue.  Anticipate this should heal with secondary intention.  Follow-Up Instructions: Return in about 3 weeks (around 02/25/2023).   Ortho Exam  Patient is alert, oriented, no adenopathy, well-dressed, normal affect, normal respiratory effort. Examination there is no redness no cellulitis the medial lateral aspect of the transtibial amputation is well-healed.  The mid substance has opened up 5 cm by half a centimeter.  After debridement of fibrinous tissue there is healthy bleeding viable granulation tissue.  Imaging: No results found.   Labs: Lab Results  Component Value Date   HGBA1C 10.8 (H) 01/06/2023   HGBA1C 9.2 (H) 09/27/2021   HGBA1C 12.5 (H) 07/23/2021   ESRSEDRATE 33 (H) 12/29/2021   ESRSEDRATE 22 (H) 12/26/2021   ESRSEDRATE 60 (H) 11/10/2017   CRP 4.5 (H) 12/29/2021   CRP 4.4 (H) 12/26/2021   CRP 3.2 (H) 11/10/2017   REPTSTATUS 01/14/2023 FINAL 01/09/2023   GRAMSTAIN  01/09/2023    FEW WBC PRESENT, PREDOMINANTLY  MONONUCLEAR NO ORGANISMS SEEN    CULT  01/09/2023    RARE METHICILLIN RESISTANT STAPHYLOCOCCUS AUREUS RARE GROUP B STREP(S.AGALACTIAE)ISOLATED TESTING AGAINST S. AGALACTIAE NOT ROUTINELY PERFORMED DUE TO PREDICTABILITY OF AMP/PEN/VAN SUSCEPTIBILITY. NO ANAEROBES ISOLATED Performed at [facility] Lab, [address], [place], [place] [address]    LABORGA METHICILLIN RESISTANT STAPHYLOCOCCUS AUREUS 01/09/2023     Lab Results  Component Value Date   ALBUMIN 2.7 (L) 01/07/2023   ALBUMIN 2.9 (L) 01/06/2023   ALBUMIN 2.8 (L) 12/29/2021   PREALBUMIN 18 01/08/2023   PREALBUMIN 17.8 (L) 12/26/2021   PREALBUMIN 11.9 (L) 09/27/2021    Lab Results  Component Value Date   MG 1.9 01/07/2023   MG 1.9 01/06/2023   MG 2.0 12/29/2021   Lab Results  Component Value Date   VD25OH 33.23 09/27/2021    Lab Results  Component Value Date   PREALBUMIN 18 01/08/2023   PREALBUMIN 17.8 (L) 12/26/2021   PREALBUMIN 11.9 (L) 09/27/2021      Latest Ref Rng & Units 01/12/2023   12:35 AM 01/10/2023   12:36 AM 01/09/2023    1:27 AM  CBC EXTENDED  WBC 4.0 - 10.5 K/uL 8.9  10.8  7.0   RBC 4.22 - 5.81 MIL/uL 4.12  4.43  4.43   Hemoglobin 13.0 - 17.0 g/dL 44.0  10.2  72.5   HCT  39.0 - 52.0 % 38.8  41.6  40.9   Platelets 150 - 400 K/uL 166  167  226      There is no height or weight on file to calculate BMI.  Orders:  No orders of the defined types were placed in this encounter.  No orders of the defined types were placed in this encounter.    Procedures: No procedures performed  Clinical Data: No additional findings.  ROS:  All other systems negative, except as noted in the HPI. Review of Systems  Objective: Vital Signs: There were no vitals taken for this visit.  Specialty Comments:  No specialty comments available.  PMFS History: Patient Active Problem List   Diagnosis Date Noted   Malnutrition of moderate degree 12/28/2021   Abscess of right lower leg    Wound infection  12/26/2021   S/P BKA (below knee amputation), right (HCC) 09/30/2021   Below-knee amputation of right lower extremity (HCC) 09/27/2021   Dehiscence of amputation stump of right lower extremity (HCC)    Wound infection after surgery 09/16/2021   Cellulitis of right lower extremity    Peripheral vascular disease (HCC)    History of transmetatarsal amputation of right foot (HCC) 08/25/2021   Osteomyelitis (HCC) 07/24/2021   HTN (hypertension) 07/23/2021   Hyperlipidemia 07/23/2021   Diabetic foot ulcers (HCC) 07/23/2021   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) 01/14/2018   Ulcer of left foot, limited to breakdown of skin (HCC) 01/14/2018   Foot osteomyelitis, left (HCC)    Septic arthritis of foot (HCC) 11/11/2017   Osteomyelitis of ankle or foot, acute, left (HCC) 11/11/2017   Type 2 DM with diabetic peripheral angiopathy w/o gangrene (HCC) 11/11/2017   Cigarette smoker 11/11/2017   Diabetic peripheral neuropathy (HCC) 11/11/2017   Diabetic foot infection (HCC) 11/10/2017   Post op infection    Delirium 12/07/2014   Cervical vertebral fusion 12/07/2014   Hallucination 12/07/2014   T2DM (type 2 diabetes mellitus) (HCC) 12/07/2014   Mood disorder (HCC) 12/07/2014   Pyrexia    Neck swelling    Past Medical History:  Diagnosis Date   Arthritis    COVID    has had Covid 3 times, not sick with it   COVID    has had 3 times mild to no symptoms   Depression    Diabetes mellitus without complication (HCC)    pt states he has now been told that he is a type 1 diabetic.   Dyspnea    albuterol inhaler   HLD (hyperlipidemia)    Hypertension    Peripheral vascular disease (HCC)     History reviewed. No pertinent family history.  Past Surgical History:  Procedure Laterality Date   ABDOMINAL AORTOGRAM W/LOWER EXTREMITY N/A 08/11/2021   Procedure: ABDOMINAL AORTOGRAM W/LOWER EXTREMITY;  Surgeon: Leonie Douglas, MD;  [facility];  Service:  Cardiovascular;  Laterality: N/A;   AMPUTATION Left 11/15/2017   Procedure: LEFT FOOT 2ND RAY AMPUTATION;  Surgeon: Nadara Mustard, MD;  Location: [facility] OR;  Service: Orthopedics;  Laterality: Left;   AMPUTATION Right 08/25/2021   Procedure: RIGHT TRANSMETATARSAL AMPUTATION;  Surgeon: Nadara Mustard, MD;  Location: [facility] OR;  Service: Orthopedics;  Laterality: Right;   AMPUTATION Right 09/27/2021   Procedure: BELOW KNEE AMPUTATION;  Surgeon: Nadara Mustard, MD;  Location: [facility] OR;  Service: Orthopedics;  Laterality: Right;   APPLICATION OF WOUND VAC  12/27/2021   Procedure: APPLICATION  OF WOUND VAC;  Surgeon: Nadara Mustard, MD;  Location: [facility] OR;  Service: Orthopedics;;   CERVICAL FUSION     I & D EXTREMITY Left 12/27/2021   Procedure: LEFT LEG DEBRIDEMENT ULCERS;  Surgeon: Nadara Mustard, MD;  Location: [facility] OR;  Service: Orthopedics;  Laterality: Left;   I & D EXTREMITY Left 01/31/2022   Procedure: LEFT KNEE DEBRIDEMENT AND APPLY TISSUE GRAFT;  Surgeon: Nadara Mustard, MD;  Location: [facility] OR;  Service: Orthopedics;  Laterality: Left;   IR ANGIOGRAM EXTREMITY LEFT  11/14/2017   IR ANGIOGRAM SELECTIVE EACH ADDITIONAL VESSEL  11/14/2017   IR ANGIOGRAM SELECTIVE EACH ADDITIONAL VESSEL  11/14/2017   IR TIB-PERO ART PTA MOD SED  11/14/2017   IR TIB-PERO ART UNI PTA EA ADD VESSEL MOD SED  11/14/2017   IR US GUIDE VASC ACCESS LEFT  11/14/2017   PERIPHERAL VASCULAR INTERVENTION Right 08/11/2021   Procedure: PERIPHERAL VASCULAR INTERVENTION;  Surgeon: Leonie Douglas, MD;  [facility];  Service: Cardiovascular;  Laterality: Right;  rt sfa stent   STUMP REVISION Right 01/09/2023   Procedure: REVISION RIGHT BELOW KNEE AMPUTATION;  Surgeon: Nadara Mustard, MD;  Location: [facility] OR;  Service: Orthopedics;  Laterality: Right;   Social History   Occupational History   Not on file  Tobacco Use   Smoking status: Every Day    Packs/day: 0.50    Years: 47.00    Additional pack years: 0.00    Total pack years: 23.50     Types: Cigarettes   Smokeless tobacco: Never  Vaping Use   Vaping Use: Some days  Substance and Sexual Activity   Alcohol use: No   Drug use: No   Sexual activity: Not on file

## 2023-02-04 NOTE — Telephone Encounter (Signed)
Pt states he had ana ppt Thursday for stitch removal and wound has come opne. Please call pt about an appt at [phone].

## 2023-02-08 ENCOUNTER — Other Ambulatory Visit: Payer: Self-pay | Admitting: Family

## 2023-02-08 MED ORDER — OXYCODONE HCL 10 MG PO TABS: 5.0000 mg | ORAL_TABLET | ORAL | 0 refills | Status: DC | PRN

## 2023-02-18 ENCOUNTER — Encounter: Payer: Self-pay | Admitting: Orthopedic Surgery

## 2023-02-18 NOTE — Progress Notes (Signed)
Office Visit Note   Patient: Cody Pena           Date of Birth: [birth year]           MRN: [id number] Visit Date: 01/31/2023              Requested by: Healthcare, [facility] [address] [place],  [place] [address] PCP: [facility], [facility]  Chief Complaint  Patient presents with   Other    REVISION RIGHT BELOW KNEE AMPUTATION 01/09/23      HPI: Patient is a 62 year old gentleman who is 3 weeks status post revision right below-knee amputation.  Patient states he fell directly on the residual limb in his bathroom 2 weeks ago.  Assessment & Plan: Visit Diagnoses:  1. Below-knee amputation of right lower extremity, subsequent encounter [facility])     Plan: Patient will follow-up with [facility] for prosthetic fitting and stump shrinker.  Follow-Up Instructions: Return in about 4 weeks (around 02/28/2023).   Ortho Exam  Patient is alert, oriented, no adenopathy, well-dressed, normal affect, normal respiratory effort. Examination the incision is well-healed there is no cellulitis no drainage no signs of infection.  Sutures are harvested today.  Imaging: No results found.   Labs: Lab Results  Component Value Date   HGBA1C 10.8 (H) 01/06/2023   HGBA1C 9.2 (H) 09/27/2021   HGBA1C 12.5 (H) 07/23/2021   ESRSEDRATE 33 (H) 12/29/2021   ESRSEDRATE 22 (H) 12/26/2021   ESRSEDRATE 60 (H) 11/10/2017   CRP 4.5 (H) 12/29/2021   CRP 4.4 (H) 12/26/2021   CRP 3.2 (H) 11/10/2017   REPTSTATUS 01/14/2023 FINAL 01/09/2023   GRAMSTAIN  01/09/2023    FEW WBC PRESENT, PREDOMINANTLY MONONUCLEAR NO ORGANISMS SEEN    CULT  01/09/2023    RARE METHICILLIN RESISTANT STAPHYLOCOCCUS AUREUS RARE GROUP B STREP(S.AGALACTIAE)ISOLATED TESTING AGAINST S. AGALACTIAE NOT ROUTINELY PERFORMED DUE TO PREDICTABILITY OF AMP/PEN/VAN SUSCEPTIBILITY. NO ANAEROBES ISOLATED Performed at [facility] Lab, [address] [place], [place] [address]    LABORGA METHICILLIN RESISTANT STAPHYLOCOCCUS  AUREUS 01/09/2023     Lab Results  Component Value Date   ALBUMIN 2.7 (L) 01/07/2023   ALBUMIN 2.9 (L) 01/06/2023   ALBUMIN 2.8 (L) 12/29/2021   PREALBUMIN 18 01/08/2023   PREALBUMIN 17.8 (L) 12/26/2021   PREALBUMIN 11.9 (L) 09/27/2021    Lab Results  Component Value Date   MG 1.9 01/07/2023   MG 1.9 01/06/2023   MG 2.0 12/29/2021   Lab Results  Component Value Date   VD25OH 33.23 09/27/2021    Lab Results  Component Value Date   PREALBUMIN 18 01/08/2023   PREALBUMIN 17.8 (L) 12/26/2021   PREALBUMIN 11.9 (L) 09/27/2021      Latest Ref Rng & Units 01/12/2023   12:35 AM 01/10/2023   12:36 AM 01/09/2023    1:27 AM  CBC EXTENDED  WBC 4.0 - 10.5 K/uL 8.9  10.8  7.0   RBC 4.22 - 5.81 MIL/uL 4.12  4.43  4.43   Hemoglobin 13.0 - 17.0 g/dL 86.5  78.4  69.6   HCT 39.0 - 52.0 % 38.8  41.6  40.9   Platelets 150 - 400 K/uL 166  167  226      There is no height or weight on file to calculate BMI.  Orders:  No orders of the defined types were placed in this encounter.  No orders of the defined types were placed in this encounter.    Procedures: No procedures performed  Clinical Data:  No additional findings.  ROS:  All other systems negative, except as noted in the HPI. Review of Systems  Objective: Vital Signs: There were no vitals taken for this visit.  Specialty Comments:  No specialty comments available.  PMFS History: Patient Active Problem List   Diagnosis Date Noted   Malnutrition of moderate degree 12/28/2021   Abscess of right lower leg    Wound infection 12/26/2021   S/P BKA (below knee amputation), right (HCC) 09/30/2021   Below-knee amputation of right lower extremity (HCC) 09/27/2021   Dehiscence of amputation stump of right lower extremity (HCC)    Wound infection after surgery 09/16/2021   Cellulitis of right lower extremity    Peripheral vascular disease (HCC)    History of transmetatarsal amputation of right foot (HCC) 08/25/2021    Osteomyelitis (HCC) 07/24/2021   HTN (hypertension) 07/23/2021   Hyperlipidemia 07/23/2021   Diabetic foot ulcers (HCC) 07/23/2021   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) 01/14/2018   Ulcer of left foot, limited to breakdown of skin (HCC) 01/14/2018   Foot osteomyelitis, left (HCC)    Septic arthritis of foot (HCC) 11/11/2017   Osteomyelitis of ankle or foot, acute, left (HCC) 11/11/2017   Type 2 DM with diabetic peripheral angiopathy w/o gangrene (HCC) 11/11/2017   Cigarette smoker 11/11/2017   Diabetic peripheral neuropathy (HCC) 11/11/2017   Diabetic foot infection (HCC) 11/10/2017   Post op infection    Delirium 12/07/2014   Cervical vertebral fusion 12/07/2014   Hallucination 12/07/2014   T2DM (type 2 diabetes mellitus) (HCC) 12/07/2014   Mood disorder (HCC) 12/07/2014   Pyrexia    Neck swelling    Past Medical History:  Diagnosis Date   Arthritis    COVID    has had Covid 3 times, not sick with it   COVID    has had 3 times mild to no symptoms   Depression    Diabetes mellitus without complication (HCC)    pt states he has now been told that he is a type 1 diabetic.   Dyspnea    albuterol inhaler   HLD (hyperlipidemia)    Hypertension    Peripheral vascular disease (HCC)     History reviewed. No pertinent family history.  Past Surgical History:  Procedure Laterality Date   ABDOMINAL AORTOGRAM W/LOWER EXTREMITY N/A 08/11/2021   Procedure: ABDOMINAL AORTOGRAM W/LOWER EXTREMITY;  Surgeon: Leonie Douglas, MD;  [facility];  Service: Cardiovascular;  Laterality: N/A;   AMPUTATION Left 11/15/2017   Procedure: LEFT FOOT 2ND RAY AMPUTATION;  Surgeon: Nadara Mustard, MD;  Location: [facility] OR;  Service: Orthopedics;  Laterality: Left;   AMPUTATION Right 08/25/2021   Procedure: RIGHT TRANSMETATARSAL AMPUTATION;  Surgeon: Nadara Mustard, MD;  Location: [facility] OR;  Service: Orthopedics;  Laterality: Right;   AMPUTATION Right 09/27/2021    Procedure: BELOW KNEE AMPUTATION;  Surgeon: Nadara Mustard, MD;  Location: [facility] OR;  Service: Orthopedics;  Laterality: Right;   APPLICATION OF WOUND VAC  12/27/2021   Procedure: APPLICATION OF WOUND VAC;  Surgeon: Nadara Mustard, MD;  Location: [facility] OR;  Service: Orthopedics;;   CERVICAL FUSION     I & D EXTREMITY Left 12/27/2021   Procedure: LEFT LEG DEBRIDEMENT ULCERS;  Surgeon: Nadara Mustard, MD;  Location: [facility] OR;  Service: Orthopedics;  Laterality: Left;   I & D EXTREMITY Left 01/31/2022   Procedure: LEFT KNEE DEBRIDEMENT AND APPLY TISSUE GRAFT;  Surgeon:  Nadara Mustard, MD;  Location: [facility] OR;  Service: Orthopedics;  Laterality: Left;   IR ANGIOGRAM EXTREMITY LEFT  11/14/2017   IR ANGIOGRAM SELECTIVE EACH ADDITIONAL VESSEL  11/14/2017   IR ANGIOGRAM SELECTIVE EACH ADDITIONAL VESSEL  11/14/2017   IR TIB-PERO ART PTA MOD SED  11/14/2017   IR TIB-PERO ART UNI PTA EA ADD VESSEL MOD SED  11/14/2017   IR US GUIDE VASC ACCESS LEFT  11/14/2017   PERIPHERAL VASCULAR INTERVENTION Right 08/11/2021   Procedure: PERIPHERAL VASCULAR INTERVENTION;  Surgeon: Leonie Douglas, MD;  [facility];  Service: Cardiovascular;  Laterality: Right;  rt sfa stent   STUMP REVISION Right 01/09/2023   Procedure: REVISION RIGHT BELOW KNEE AMPUTATION;  Surgeon: Nadara Mustard, MD;  Location: [facility] OR;  Service: Orthopedics;  Laterality: Right;   Social History   Occupational History   Not on file  Tobacco Use   Smoking status: Every Day    Packs/day: 0.50    Years: 47.00    Additional pack years: 0.00    Total pack years: 23.50    Types: Cigarettes   Smokeless tobacco: Never  Vaping Use   Vaping Use: Some days  Substance and Sexual Activity   Alcohol use: No   Drug use: No   Sexual activity: Not on file

## 2023-02-22 ENCOUNTER — Other Ambulatory Visit: Payer: Self-pay | Admitting: Family

## 2023-02-22 MED ORDER — OXYCODONE HCL 5 MG PO CAPS: 5.0000 mg | ORAL_CAPSULE | Freq: Four times a day (QID) | ORAL | 0 refills | Status: DC | PRN

## 2023-02-25 ENCOUNTER — Ambulatory Visit (INDEPENDENT_AMBULATORY_CARE_PROVIDER_SITE_OTHER): Payer: Medicaid Other | Admitting: Orthopedic Surgery

## 2023-02-25 DIAGNOSIS — S88111D Complete traumatic amputation at level between knee and ankle, right lower leg, subsequent encounter: Secondary | ICD-10-CM

## 2023-02-25 DIAGNOSIS — Z89511 Acquired absence of right leg below knee: Secondary | ICD-10-CM

## 2023-02-26 ENCOUNTER — Encounter: Payer: Self-pay | Admitting: Orthopedic Surgery

## 2023-02-26 NOTE — Progress Notes (Signed)
Office Visit Note   Patient: Cody Pena           Date of Birth: [birth year]           MRN: [id number] Visit Date: 02/25/2023              Requested by: Healthcare, [facility] [address] [place],  [place] [address] PCP: Healthcare, [facility]  Chief Complaint  Patient presents with   Right Leg - Routine Post Op    01/09/2023 right BKA revision       HPI: Patient is a 62 year old gentleman who is status post right below-knee amputation revision May 29.  Patient is currently washing with soap and water and using the shrinker.  Assessment & Plan: Visit Diagnoses:  1. Below-knee amputation of right lower extremity, subsequent encounter (HCC)     Plan: Recommended patient apply the shrinker directly against the skin 4 x 4 and Ace on top of the shrinker.  Change this daily and patient will follow-up with [facility] in 1 week to obtain smaller shrinkers.  Follow-Up Instructions: Return in about 4 weeks (around 03/25/2023).   Ortho Exam  Patient is alert, oriented, no adenopathy, well-dressed, normal affect, normal respiratory effort. Examination there is 1 area of wound dehiscence that is 4 x 0.5 cm.  The wound is flat with healthy granulation tissue.  Patient does have some dermatitis laterally from his adhesive dressing.  Imaging: No results found. No images are attached to the encounter.  Labs: Lab Results  Component Value Date   HGBA1C 10.8 (H) 01/06/2023   HGBA1C 9.2 (H) 09/27/2021   HGBA1C 12.5 (H) 07/23/2021   ESRSEDRATE 33 (H) 12/29/2021   ESRSEDRATE 22 (H) 12/26/2021   ESRSEDRATE 60 (H) 11/10/2017   CRP 4.5 (H) 12/29/2021   CRP 4.4 (H) 12/26/2021   CRP 3.2 (H) 11/10/2017   REPTSTATUS 01/14/2023 FINAL 01/09/2023   GRAMSTAIN  01/09/2023    FEW WBC PRESENT, PREDOMINANTLY MONONUCLEAR NO ORGANISMS SEEN    CULT  01/09/2023    RARE METHICILLIN RESISTANT STAPHYLOCOCCUS AUREUS RARE GROUP B STREP(S.AGALACTIAE)ISOLATED TESTING AGAINST S. AGALACTIAE NOT  ROUTINELY PERFORMED DUE TO PREDICTABILITY OF AMP/PEN/VAN SUSCEPTIBILITY. NO ANAEROBES ISOLATED Performed at [facility], [address], [place], [place] [address]    LABORGA METHICILLIN RESISTANT STAPHYLOCOCCUS AUREUS 01/09/2023     Lab Results  Component Value Date   ALBUMIN 2.7 (L) 01/07/2023   ALBUMIN 2.9 (L) 01/06/2023   ALBUMIN 2.8 (L) 12/29/2021   PREALBUMIN 18 01/08/2023   PREALBUMIN 17.8 (L) 12/26/2021   PREALBUMIN 11.9 (L) 09/27/2021    Lab Results  Component Value Date   MG 1.9 01/07/2023   MG 1.9 01/06/2023   MG 2.0 12/29/2021   Lab Results  Component Value Date   VD25OH 33.23 09/27/2021    Lab Results  Component Value Date   PREALBUMIN 18 01/08/2023   PREALBUMIN 17.8 (L) 12/26/2021   PREALBUMIN 11.9 (L) 09/27/2021      Latest Ref Rng & Units 01/12/2023   12:35 AM 01/10/2023   12:36 AM 01/09/2023    1:27 AM  CBC EXTENDED  WBC 4.0 - 10.5 K/uL 8.9  10.8  7.0   RBC 4.22 - 5.81 MIL/uL 4.12  4.43  4.43   Hemoglobin 13.0 - 17.0 g/dL 09.3  23.5  57.3   HCT 39.0 - 52.0 % 38.8  41.6  40.9   Platelets 150 - 400 K/uL 166  167  226      There  is no height or weight on file to calculate BMI.  Orders:  No orders of the defined types were placed in this encounter.  No orders of the defined types were placed in this encounter.    Procedures: No procedures performed  Clinical Data: No additional findings.  ROS:  All other systems negative, except as noted in the HPI. Review of Systems  Objective: Vital Signs: There were no vitals taken for this visit.  Specialty Comments:  No specialty comments available.  PMFS History: Patient Active Problem List   Diagnosis Date Noted   Malnutrition of moderate degree 12/28/2021   Abscess of right lower leg    Wound infection 12/26/2021   S/P BKA (below knee amputation), right (HCC) 09/30/2021   Below-knee amputation of right lower extremity (HCC) 09/27/2021   Dehiscence of amputation stump of right  lower extremity (HCC)    Wound infection after surgery 09/16/2021   Cellulitis of right lower extremity    Peripheral vascular disease (HCC)    History of transmetatarsal amputation of right foot (HCC) 08/25/2021   Osteomyelitis (HCC) 07/24/2021   HTN (hypertension) 07/23/2021   Hyperlipidemia 07/23/2021   Diabetic foot ulcers (HCC) 07/23/2021   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) 01/14/2018   Ulcer of left foot, limited to breakdown of skin (HCC) 01/14/2018   Foot osteomyelitis, left (HCC)    Septic arthritis of foot (HCC) 11/11/2017   Osteomyelitis of ankle or foot, acute, left (HCC) 11/11/2017   Type 2 DM with diabetic peripheral angiopathy w/o gangrene (HCC) 11/11/2017   Cigarette smoker 11/11/2017   Diabetic peripheral neuropathy (HCC) 11/11/2017   Diabetic foot infection (HCC) 11/10/2017   Post op infection    Delirium 12/07/2014   Cervical vertebral fusion 12/07/2014   Hallucination 12/07/2014   T2DM (type 2 diabetes mellitus) (HCC) 12/07/2014   Mood disorder (HCC) 12/07/2014   Pyrexia    Neck swelling    Past Medical History:  Diagnosis Date   Arthritis    COVID    has had Covid 3 times, not sick with it   COVID    has had 3 times mild to no symptoms   Depression    Diabetes mellitus without complication (HCC)    pt states he has now been told that he is a type 1 diabetic.   Dyspnea    albuterol inhaler   HLD (hyperlipidemia)    Hypertension    Peripheral vascular disease (HCC)     History reviewed. No pertinent family history.  Past Surgical History:  Procedure Laterality Date   ABDOMINAL AORTOGRAM W/LOWER EXTREMITY N/A 08/11/2021   Procedure: ABDOMINAL AORTOGRAM W/LOWER EXTREMITY;  Surgeon: Leonie Douglas, MD;  Location: [facility];  Service: Cardiovascular;  Laterality: N/A;   AMPUTATION Left 11/15/2017   Procedure: LEFT FOOT 2ND RAY AMPUTATION;  Surgeon: Nadara Mustard, MD;  Location: [facility] OR;  Service: Orthopedics;   Laterality: Left;   AMPUTATION Right 08/25/2021   Procedure: RIGHT TRANSMETATARSAL AMPUTATION;  Surgeon: Nadara Mustard, MD;  Location: [facility] OR;  Service: Orthopedics;  Laterality: Right;   AMPUTATION Right 09/27/2021   Procedure: BELOW KNEE AMPUTATION;  Surgeon: Nadara Mustard, MD;  Location: [facility] OR;  Service: Orthopedics;  Laterality: Right;   APPLICATION OF WOUND VAC  12/27/2021   Procedure: APPLICATION OF WOUND VAC;  Surgeon: Nadara Mustard, MD;  Location: [facility] OR;  Service: Orthopedics;;   CERVICAL FUSION     I & D  EXTREMITY Left 12/27/2021   Procedure: LEFT LEG DEBRIDEMENT ULCERS;  Surgeon: Nadara Mustard, MD;  Location: [facility] OR;  Service: Orthopedics;  Laterality: Left;   I & D EXTREMITY Left 01/31/2022   Procedure: LEFT KNEE DEBRIDEMENT AND APPLY TISSUE GRAFT;  Surgeon: Nadara Mustard, MD;  Location: [facility] OR;  Service: Orthopedics;  Laterality: Left;   IR ANGIOGRAM EXTREMITY LEFT  11/14/2017   IR ANGIOGRAM SELECTIVE EACH ADDITIONAL VESSEL  11/14/2017   IR ANGIOGRAM SELECTIVE EACH ADDITIONAL VESSEL  11/14/2017   IR TIB-PERO ART PTA MOD SED  11/14/2017   IR TIB-PERO ART UNI PTA EA ADD VESSEL MOD SED  11/14/2017   IR US GUIDE VASC ACCESS LEFT  11/14/2017   PERIPHERAL VASCULAR INTERVENTION Right 08/11/2021   Procedure: PERIPHERAL VASCULAR INTERVENTION;  Surgeon: Leonie Douglas, MD;  Location: [facility];  Service: Cardiovascular;  Laterality: Right;  rt sfa stent   STUMP REVISION Right 01/09/2023   Procedure: REVISION RIGHT BELOW KNEE AMPUTATION;  Surgeon: Nadara Mustard, MD;  Location: [facility] OR;  Service: Orthopedics;  Laterality: Right;   Social History   Occupational History   Not on file  Tobacco Use   Smoking status: Every Day    Current packs/day: 0.50    Average packs/day: 0.5 packs/day for 47.0 years (23.5 ttl pk-yrs)    Types: Cigarettes   Smokeless tobacco: Never  Vaping Use   Vaping status: Some Days  Substance and Sexual Activity   Alcohol use: No   Drug use: No   Sexual activity:  Not on file

## 2023-03-04 ENCOUNTER — Encounter: Payer: Medicaid Other | Admitting: Orthopedic Surgery

## 2023-03-25 ENCOUNTER — Encounter: Payer: Medicaid Other | Admitting: Orthopedic Surgery

## 2023-03-27 ENCOUNTER — Other Ambulatory Visit: Payer: Self-pay | Admitting: Family

## 2023-07-28 ENCOUNTER — Encounter (HOSPITAL_COMMUNITY): Payer: Self-pay

## 2023-07-28 ENCOUNTER — Inpatient Hospital Stay (HOSPITAL_COMMUNITY): Payer: Medicaid Other

## 2023-07-28 ENCOUNTER — Emergency Department (HOSPITAL_COMMUNITY): Payer: Medicaid Other

## 2023-07-28 ENCOUNTER — Inpatient Hospital Stay (HOSPITAL_COMMUNITY)
Admission: EM | Admit: 2023-07-28 | Discharge: 2023-08-05 | DRG: 853 | Disposition: A | Discharge status: Home Health | Payer: Medicaid Other | Attending: Internal Medicine | Admitting: Internal Medicine

## 2023-07-28 DIAGNOSIS — E785 Hyperlipidemia, unspecified: Secondary | ICD-10-CM | POA: Diagnosis not present

## 2023-07-28 DIAGNOSIS — E119 Type 2 diabetes mellitus without complications: Secondary | ICD-10-CM

## 2023-07-28 DIAGNOSIS — Z7984 Long term (current) use of oral hypoglycemic drugs: Secondary | ICD-10-CM

## 2023-07-28 DIAGNOSIS — I6349 Cerebral infarction due to embolism of other cerebral artery: Secondary | ICD-10-CM | POA: Diagnosis present

## 2023-07-28 DIAGNOSIS — F05 Delirium due to known physiological condition: Secondary | ICD-10-CM | POA: Diagnosis not present

## 2023-07-28 DIAGNOSIS — C7951 Secondary malignant neoplasm of bone: Secondary | ICD-10-CM | POA: Diagnosis present

## 2023-07-28 DIAGNOSIS — J439 Emphysema, unspecified: Secondary | ICD-10-CM | POA: Diagnosis present

## 2023-07-28 DIAGNOSIS — R0609 Other forms of dyspnea: Secondary | ICD-10-CM | POA: Diagnosis not present

## 2023-07-28 DIAGNOSIS — I4819 Other persistent atrial fibrillation: Secondary | ICD-10-CM | POA: Diagnosis present

## 2023-07-28 DIAGNOSIS — C773 Secondary and unspecified malignant neoplasm of axilla and upper limb lymph nodes: Secondary | ICD-10-CM | POA: Diagnosis present

## 2023-07-28 DIAGNOSIS — A419 Sepsis, unspecified organism: Principal | ICD-10-CM | POA: Diagnosis present

## 2023-07-28 DIAGNOSIS — I639 Cerebral infarction, unspecified: Secondary | ICD-10-CM | POA: Insufficient documentation

## 2023-07-28 DIAGNOSIS — R4182 Altered mental status, unspecified: Secondary | ICD-10-CM | POA: Diagnosis not present

## 2023-07-28 DIAGNOSIS — I2693 Single subsegmental pulmonary embolism without acute cor pulmonale: Secondary | ICD-10-CM | POA: Diagnosis present

## 2023-07-28 DIAGNOSIS — Z881 Allergy status to other antibiotic agents status: Secondary | ICD-10-CM

## 2023-07-28 DIAGNOSIS — D75839 Thrombocytosis, unspecified: Secondary | ICD-10-CM | POA: Diagnosis present

## 2023-07-28 DIAGNOSIS — Z515 Encounter for palliative care: Secondary | ICD-10-CM

## 2023-07-28 DIAGNOSIS — Z9713 Presence of artificial right leg (complete) (partial): Secondary | ICD-10-CM

## 2023-07-28 DIAGNOSIS — I2699 Other pulmonary embolism without acute cor pulmonale: Secondary | ICD-10-CM | POA: Diagnosis not present

## 2023-07-28 DIAGNOSIS — C3412 Malignant neoplasm of upper lobe, left bronchus or lung: Secondary | ICD-10-CM | POA: Diagnosis present

## 2023-07-28 DIAGNOSIS — R64 Cachexia: Secondary | ICD-10-CM | POA: Diagnosis present

## 2023-07-28 DIAGNOSIS — M545 Low back pain, unspecified: Secondary | ICD-10-CM | POA: Diagnosis not present

## 2023-07-28 DIAGNOSIS — B370 Candidal stomatitis: Secondary | ICD-10-CM | POA: Diagnosis present

## 2023-07-28 DIAGNOSIS — I110 Hypertensive heart disease with heart failure: Secondary | ICD-10-CM | POA: Diagnosis present

## 2023-07-28 DIAGNOSIS — F32A Depression, unspecified: Secondary | ICD-10-CM | POA: Diagnosis present

## 2023-07-28 DIAGNOSIS — Z825 Family history of asthma and other chronic lower respiratory diseases: Secondary | ICD-10-CM

## 2023-07-28 DIAGNOSIS — Z8616 Personal history of COVID-19: Secondary | ICD-10-CM

## 2023-07-28 DIAGNOSIS — G936 Cerebral edema: Secondary | ICD-10-CM | POA: Diagnosis present

## 2023-07-28 DIAGNOSIS — E1165 Type 2 diabetes mellitus with hyperglycemia: Secondary | ICD-10-CM | POA: Diagnosis not present

## 2023-07-28 DIAGNOSIS — I724 Aneurysm of artery of lower extremity: Secondary | ICD-10-CM | POA: Diagnosis present

## 2023-07-28 DIAGNOSIS — Z89511 Acquired absence of right leg below knee: Secondary | ICD-10-CM

## 2023-07-28 DIAGNOSIS — E860 Dehydration: Secondary | ICD-10-CM | POA: Diagnosis present

## 2023-07-28 DIAGNOSIS — E1151 Type 2 diabetes mellitus with diabetic peripheral angiopathy without gangrene: Secondary | ICD-10-CM | POA: Diagnosis present

## 2023-07-28 DIAGNOSIS — B3789 Other sites of candidiasis: Secondary | ICD-10-CM | POA: Diagnosis present

## 2023-07-28 DIAGNOSIS — T380X5A Adverse effect of glucocorticoids and synthetic analogues, initial encounter: Secondary | ICD-10-CM | POA: Diagnosis present

## 2023-07-28 DIAGNOSIS — Z6821 Body mass index (BMI) 21.0-21.9, adult: Secondary | ICD-10-CM

## 2023-07-28 DIAGNOSIS — E872 Acidosis, unspecified: Secondary | ICD-10-CM | POA: Diagnosis present

## 2023-07-28 DIAGNOSIS — N10 Acute tubulo-interstitial nephritis: Secondary | ICD-10-CM | POA: Diagnosis present

## 2023-07-28 DIAGNOSIS — F17290 Nicotine dependence, other tobacco product, uncomplicated: Secondary | ICD-10-CM | POA: Diagnosis present

## 2023-07-28 DIAGNOSIS — Z9181 History of falling: Secondary | ICD-10-CM

## 2023-07-28 DIAGNOSIS — I10 Essential (primary) hypertension: Secondary | ICD-10-CM | POA: Diagnosis present

## 2023-07-28 DIAGNOSIS — N12 Tubulo-interstitial nephritis, not specified as acute or chronic: Principal | ICD-10-CM

## 2023-07-28 DIAGNOSIS — M549 Dorsalgia, unspecified: Secondary | ICD-10-CM

## 2023-07-28 DIAGNOSIS — R634 Abnormal weight loss: Secondary | ICD-10-CM | POA: Diagnosis not present

## 2023-07-28 DIAGNOSIS — Z7901 Long term (current) use of anticoagulants: Secondary | ICD-10-CM

## 2023-07-28 DIAGNOSIS — E871 Hypo-osmolality and hyponatremia: Secondary | ICD-10-CM | POA: Diagnosis present

## 2023-07-28 DIAGNOSIS — R6520 Severe sepsis without septic shock: Secondary | ICD-10-CM

## 2023-07-28 DIAGNOSIS — I4891 Unspecified atrial fibrillation: Secondary | ICD-10-CM

## 2023-07-28 DIAGNOSIS — D638 Anemia in other chronic diseases classified elsewhere: Secondary | ICD-10-CM | POA: Diagnosis present

## 2023-07-28 DIAGNOSIS — C7931 Secondary malignant neoplasm of brain: Secondary | ICD-10-CM | POA: Diagnosis present

## 2023-07-28 DIAGNOSIS — Z794 Long term (current) use of insulin: Secondary | ICD-10-CM

## 2023-07-28 DIAGNOSIS — G9389 Other specified disorders of brain: Secondary | ICD-10-CM

## 2023-07-28 DIAGNOSIS — I5021 Acute systolic (congestive) heart failure: Secondary | ICD-10-CM | POA: Diagnosis not present

## 2023-07-28 DIAGNOSIS — Z7189 Other specified counseling: Secondary | ICD-10-CM | POA: Diagnosis not present

## 2023-07-28 DIAGNOSIS — G9341 Metabolic encephalopathy: Secondary | ICD-10-CM

## 2023-07-28 DIAGNOSIS — D6869 Other thrombophilia: Secondary | ICD-10-CM | POA: Diagnosis present

## 2023-07-28 DIAGNOSIS — Z79899 Other long term (current) drug therapy: Secondary | ICD-10-CM

## 2023-07-28 DIAGNOSIS — I5022 Chronic systolic (congestive) heart failure: Secondary | ICD-10-CM | POA: Diagnosis present

## 2023-07-28 DIAGNOSIS — I480 Paroxysmal atrial fibrillation: Secondary | ICD-10-CM

## 2023-07-28 DIAGNOSIS — J90 Pleural effusion, not elsewhere classified: Secondary | ICD-10-CM

## 2023-07-28 DIAGNOSIS — I729 Aneurysm of unspecified site: Secondary | ICD-10-CM

## 2023-07-28 DIAGNOSIS — Z86711 Personal history of pulmonary embolism: Secondary | ICD-10-CM | POA: Diagnosis not present

## 2023-07-28 DIAGNOSIS — F17210 Nicotine dependence, cigarettes, uncomplicated: Secondary | ICD-10-CM | POA: Diagnosis present

## 2023-07-28 DIAGNOSIS — R918 Other nonspecific abnormal finding of lung field: Secondary | ICD-10-CM | POA: Diagnosis present

## 2023-07-28 DIAGNOSIS — Z781 Physical restraint status: Secondary | ICD-10-CM

## 2023-07-28 DIAGNOSIS — I5020 Unspecified systolic (congestive) heart failure: Secondary | ICD-10-CM | POA: Diagnosis not present

## 2023-07-28 DIAGNOSIS — I723 Aneurysm of iliac artery: Secondary | ICD-10-CM | POA: Diagnosis present

## 2023-07-28 LAB — COMPREHENSIVE METABOLIC PANEL
ALT: 13 U/L (ref 0–44)
AST: 21 U/L (ref 15–41)
Albumin: 2.2 g/dL — ABNORMAL LOW (ref 3.5–5.0)
Alkaline Phosphatase: 95 U/L (ref 38–126)
Anion gap: 13 (ref 5–15)
BUN: 32 mg/dL — ABNORMAL HIGH (ref 8–23)
CO2: 24 mmol/L (ref 22–32)
Calcium: 12.3 mg/dL — ABNORMAL HIGH (ref 8.9–10.3)
Chloride: 95 mmol/L — ABNORMAL LOW (ref 98–111)
Creatinine, Ser: 1.01 mg/dL (ref 0.61–1.24)
GFR, Estimated: >60 mL/min (ref >60)
Glucose, Bld: 228 mg/dL — ABNORMAL HIGH (ref 70–99)
Potassium: 4.5 mmol/L (ref 3.5–5.1)
Sodium: 132 mmol/L — ABNORMAL LOW (ref 135–145)
Total Bilirubin: 0.9 mg/dL (ref <1.2)
Total Protein: 7.3 g/dL (ref 6.5–8.1)

## 2023-07-28 LAB — I-STAT CG4 LACTIC ACID, ED
Lactic Acid, Venous: 3.7 mmol/L (ref 0.5–1.9)
Lactic Acid, Venous: 2 mmol/L (ref 0.5–1.9)

## 2023-07-28 LAB — CBC WITH DIFFERENTIAL/PLATELET
Abs Immature Granulocytes: 0.1 10*3/uL — ABNORMAL HIGH (ref 0.00–0.07)
Basophils Absolute: 0.1 10*3/uL (ref 0.0–0.1)
Basophils Relative: 1 %
Eosinophils Absolute: 0.1 10*3/uL (ref 0.0–0.5)
Eosinophils Relative: 0 %
HCT: 40.9 % (ref 39.0–52.0)
Hemoglobin: 13 g/dL (ref 13.0–17.0)
Immature Granulocytes: 1 %
Lymphocytes Relative: 9 %
Lymphs Abs: 1.6 10*3/uL (ref 0.7–4.0)
MCH: 26.4 pg (ref 26.0–34.0)
MCHC: 31.8 g/dL (ref 30.0–36.0)
MCV: 83.1 fL (ref 80.0–100.0)
Monocytes Absolute: 1.3 10*3/uL — ABNORMAL HIGH (ref 0.1–1.0)
Monocytes Relative: 7 %
Neutro Abs: 15.4 10*3/uL — ABNORMAL HIGH (ref 1.7–7.7)
Neutrophils Relative %: 82 %
Platelets: 515 10*3/uL — ABNORMAL HIGH (ref 150–400)
RBC: 4.92 MIL/uL (ref 4.22–5.81)
RDW: 14.3 % (ref 11.5–15.5)
WBC: 18.6 10*3/uL — ABNORMAL HIGH (ref 4.0–10.5)
nRBC: 0 % (ref 0.0–0.2)

## 2023-07-28 LAB — CBG MONITORING, ED: Glucose-Capillary: 314 mg/dL — ABNORMAL HIGH (ref 70–99)

## 2023-07-28 LAB — HEMOGLOBIN A1C
Hgb A1c MFr Bld: 9.9 % — ABNORMAL HIGH (ref 4.8–5.6)
Mean Plasma Glucose: 237.43 mg/dL

## 2023-07-28 LAB — TROPONIN I (HIGH SENSITIVITY)
Troponin I (High Sensitivity): 21 ng/L — ABNORMAL HIGH (ref <18)
Troponin I (High Sensitivity): 24 ng/L — ABNORMAL HIGH (ref <18)

## 2023-07-28 LAB — LACTIC ACID, PLASMA: Lactic Acid, Venous: 1.6 mmol/L (ref 0.5–1.9)

## 2023-07-28 MED ORDER — CEFEPIME HCL 2 G IV SOLR
2.0000 g | Freq: Three times a day (TID) | INTRAVENOUS | Status: DC
  Administered 2023-07-29 – 2023-08-05 (×23): 2 g via INTRAVENOUS
  Filled 2023-07-28 (×23): qty 12.5

## 2023-07-28 MED ORDER — ONDANSETRON HCL 4 MG/2ML IJ SOLN
4.0000 mg | Freq: Once | INTRAMUSCULAR | Status: AC
  Administered 2023-07-28: 4 mg via INTRAVENOUS
  Filled 2023-07-28: qty 2

## 2023-07-28 MED ORDER — SODIUM CHLORIDE 0.9 % IV SOLN
2.0000 g | Freq: Once | INTRAVENOUS | Status: AC
  Administered 2023-07-28: 2 g via INTRAVENOUS
  Filled 2023-07-28: qty 12.5

## 2023-07-28 MED ORDER — IOHEXOL 350 MG/ML SOLN
75.0000 mL | Freq: Once | INTRAVENOUS | Status: AC | PRN
  Administered 2023-07-28: 75 mL via INTRAVENOUS

## 2023-07-28 MED ORDER — VANCOMYCIN HCL IN DEXTROSE 1-5 GM/200ML-% IV SOLN
1000.0000 mg | Freq: Once | INTRAVENOUS | Status: AC
  Administered 2023-07-28: 1000 mg via INTRAVENOUS
  Filled 2023-07-28: qty 200

## 2023-07-28 MED ORDER — LACTATED RINGERS IV BOLUS
1000.0000 mL | Freq: Once | INTRAVENOUS | Status: AC
  Administered 2023-07-28: 1000 mL via INTRAVENOUS

## 2023-07-28 MED ORDER — FENTANYL CITRATE PF 50 MCG/ML IJ SOSY
50.0000 ug | PREFILLED_SYRINGE | Freq: Once | INTRAMUSCULAR | Status: AC
  Administered 2023-07-28: 50 ug via INTRAVENOUS
  Filled 2023-07-28: qty 1

## 2023-07-28 MED ORDER — DILTIAZEM HCL 25 MG/5ML IV SOLN
10.0000 mg | Freq: Once | INTRAVENOUS | Status: AC
  Administered 2023-07-28: 10 mg via INTRAVENOUS

## 2023-07-28 MED ORDER — DILTIAZEM HCL-DEXTROSE 125-5 MG/125ML-% IV SOLN (PREMIX)
5.0000 mg/h | INTRAVENOUS | Status: DC
  Administered 2023-07-28: 5 mg/h via INTRAVENOUS
  Administered 2023-07-29 (×2): 15 mg/h via INTRAVENOUS
  Filled 2023-07-28 (×6): qty 125

## 2023-07-28 MED ORDER — IOHEXOL 350 MG/ML SOLN
75.0000 mL | Freq: Once | INTRAVENOUS | Status: AC | PRN
  Administered 2023-07-29: 75 mL via INTRAVENOUS

## 2023-07-28 MED ORDER — SODIUM CHLORIDE 0.9 % IV SOLN: INTRAVENOUS | Status: AC

## 2023-07-28 MED ORDER — MORPHINE SULFATE (PF) 4 MG/ML IV SOLN
4.0000 mg | Freq: Once | INTRAVENOUS | Status: AC
  Administered 2023-07-28: 4 mg via INTRAVENOUS
  Filled 2023-07-28: qty 1

## 2023-07-28 MED ORDER — DEXAMETHASONE SODIUM PHOSPHATE 4 MG/ML IJ SOLN: 4.0000 mg | Freq: Once | INTRAMUSCULAR | Status: DC

## 2023-07-28 MED ORDER — GADOBUTROL 1 MMOL/ML IV SOLN
10.0000 mL | Freq: Once | INTRAVENOUS | Status: AC | PRN
  Administered 2023-07-28: 10 mL via INTRAVENOUS

## 2023-07-28 MED ORDER — DEXAMETHASONE SODIUM PHOSPHATE 4 MG/ML IJ SOLN
4.0000 mg | Freq: Every day | INTRAMUSCULAR | Status: DC
  Administered 2023-07-28 – 2023-08-01 (×5): 4 mg via INTRAVENOUS
  Filled 2023-07-28 (×5): qty 1

## 2023-07-28 MED ORDER — INSULIN ASPART 100 UNIT/ML IJ SOLN
0.0000 [IU] | Freq: Three times a day (TID) | INTRAMUSCULAR | Status: DC
  Administered 2023-07-28: 15 [IU] via SUBCUTANEOUS
  Administered 2023-07-29 (×2): 7 [IU] via SUBCUTANEOUS
  Administered 2023-07-29 – 2023-07-30 (×3): 4 [IU] via SUBCUTANEOUS
  Administered 2023-07-30: 7 [IU] via SUBCUTANEOUS
  Administered 2023-07-30: 4 [IU] via SUBCUTANEOUS
  Administered 2023-07-31 (×2): 7 [IU] via SUBCUTANEOUS
  Administered 2023-07-31: 3 [IU] via SUBCUTANEOUS
  Administered 2023-08-01: 4 [IU] via SUBCUTANEOUS
  Administered 2023-08-01: 7 [IU] via SUBCUTANEOUS
  Administered 2023-08-01: 4 [IU] via SUBCUTANEOUS
  Administered 2023-08-01: 7 [IU] via SUBCUTANEOUS
  Administered 2023-08-02: 11 [IU] via SUBCUTANEOUS
  Administered 2023-08-02: 15 [IU] via SUBCUTANEOUS
  Administered 2023-08-02: 20 [IU] via SUBCUTANEOUS
  Administered 2023-08-03: 4 [IU] via SUBCUTANEOUS
  Administered 2023-08-03: 7 [IU] via SUBCUTANEOUS
  Administered 2023-08-03 – 2023-08-05 (×5): 4 [IU] via SUBCUTANEOUS
  Administered 2023-08-05: 3 [IU] via SUBCUTANEOUS
  Administered 2023-08-05: 7 [IU] via SUBCUTANEOUS

## 2023-07-28 NOTE — Assessment & Plan Note (Signed)
-  CT head demonstrated complex cystic and solid 3.4cm mass in left parietal lobe with some mild hyperdensity associated with that could represent mineralization and or hemorrhage.  Surrounding edema and mild regional mass effect without midline shift.  No evidence of acute large vascular infarct. -start IV decadron for edema -seizure precaution -need oncology consult tomorrow

## 2023-07-28 NOTE — Progress Notes (Signed)
Pharmacy Antibiotic Note  Cody Pena is a 62 y.o. male admitted on 07/28/2023 with sepsis 2/2 UTI.  Pharmacy has been consulted for Cefepime dosing.  Patient received Cefepime 2g IV x1 and Vancomycin 2g IV in ED on 12/15  WBC 18.6, Tmax 97.7, LA 3.7 > 2 SCr 1.01  Plan: Cefepime 2g IV q8h  Monitor daily CBC, temp, SCr, and for clinical signs of improvement  F/u cultures and de-escalate antibiotics as able   Height: 6\' 4"  (193 cm) Weight: 97.1 kg (214 lb) IBW/kg (Calculated) : 86.8  Temp (24hrs), Avg:97.7 F (36.5 C), Min:97.6 F (36.4 C), Max:97.7 F (36.5 C)  Recent Labs  Lab 07/28/23 1548 07/28/23 1553 07/28/23 1801  WBC 18.6*  --   --   CREATININE 1.01  --   --   LATICACIDVEN  --  3.7* 2.0*    Estimated Creatinine Clearance: 90.7 mL/min (by C-G formula based on SCr of 1.01 mg/dL).    Allergies  Allergen Reactions   Ceftriaxone Itching, Nausea And Vomiting and Rash    Noted minutes after starting medication No rash noted with Ancef on 01-31-2022 during general anesthesia Has received cefepime and cefazolin without reaction    Antimicrobials this admission: Cefepime 12/15 >>  Vancomycin 12/15 x1   Dose adjustments this admission: N/A  Microbiology results: 12/15 Bcx x2: sent 12/15 UCx: sent    Thank you for allowing pharmacy to be a part of this patient's care.  Wilburn Cornelia, PharmD, BCPS Clinical Pharmacist 07/28/2023 9:30 PM   Please refer to [facility] for pharmacy phone number

## 2023-07-28 NOTE — Assessment & Plan Note (Addendum)
-   Newly diagnosed atrial fibrillation.  Likely related to his sepsis and newly diagnosed malignancy. Also will r/o PE with CTA chest.  -CT head brain lesion has some concerns for hemorrhage.  Will await MRI brain before consideration of anticoagulation. -CHA2DS2-VASc score of 3  -continue IV diltiazem infusion with goal HR >110 -Keep on IV fluids for hydration with sepsis -check TSH -check echocardiogram

## 2023-07-28 NOTE — Assessment & Plan Note (Signed)
-  CT L spine with heterogenicity of the marrow of L1 that may be chronic changes but metastasis not excluded.  Will obtain MRI of the L-spine in light of malignancy noted in other imaging

## 2023-07-28 NOTE — Assessment & Plan Note (Addendum)
-  secondary to bilateral pyelonephritis -He presented with atrial fibrillation with RVR and leukocytosis with findings of bilateral pyelonephritis on CT abdomen pelvis -Has received IV vancomycin and cefepime in the ED -Will continue IV cefepime due to ceftriaxone allergy -Will obtain urine culture

## 2023-07-28 NOTE — ED Notes (Signed)
CCM notified about cardiac monitoring.

## 2023-07-28 NOTE — ED Notes (Signed)
Admitting provider at the bedside.

## 2023-07-28 NOTE — Assessment & Plan Note (Signed)
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

## 2023-07-28 NOTE — Progress Notes (Signed)
ED Pharmacy Antibiotic Sign Off An antibiotic consult was received from an ED provider for Cefepime and Vancomycin per pharmacy dosing for Sepsis. A chart review was completed to assess appropriateness.   The following one time order(s) were placed:  Cefepime 2g IV x1 Vancomycin 2000mg  (given as 1000mg  IV x2)  Further antibiotic and/or antibiotic pharmacy consults should be ordered by the admitting provider if indicated.   Thank you for allowing pharmacy to be a part of this patient's care.   Cody Pena, PharmD, BCPS Clinical Pharmacist 07/28/2023 5:21 PM   Please refer to [facility] for pharmacy phone number

## 2023-07-28 NOTE — Assessment & Plan Note (Signed)
-  will continue to hydrate overnight and follow repeat in the morning

## 2023-07-28 NOTE — Assessment & Plan Note (Signed)
-  insulin-dependent T2DM -keep on resistant SSI while on steroids for cerebral edema

## 2023-07-28 NOTE — Assessment & Plan Note (Addendum)
-  CT chest demonstrated large heterogeneous mass in the left suprahilar region extending into the left upper lobe.  Left upper lobe bronchus is occluded by the mass.  Multiple pulmonary nodules in the right lung consistent with metastasis.  Mediastinal, bilateral hilar, left axillary lymphadenopathy consistent with metastasis -currently stable on room air -needs oncology consult in the morning

## 2023-07-28 NOTE — ED Triage Notes (Signed)
Per EMS, Pt, from home, presents d/t AMS x1 week and back pain r/t fall 12/1.  A&Ox2.  CBG 352.  Pt noted to be in A Fib w/o history.    20mg  Cardizem and 750mg  NS given en route.

## 2023-07-28 NOTE — ED Notes (Signed)
Patient back from MRI.

## 2023-07-28 NOTE — ED Notes (Signed)
Patient transported to CT 

## 2023-07-28 NOTE — ED Provider Notes (Signed)
[place] EMERGENCY DEPARTMENT AT [facility] Provider Note  CSN: [id number] Arrival date & time: 07/28/23 1507  Chief Complaint(s) Altered Mental Status, Hyperglycemia, and Back Pain  HPI Cody Pena is a 62 y.o. 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  Patient reportedly has been progressively more altered over the last 1 week and suffered a fall 14 days ago on 12 1.  Has had progressive worsening low back pain since the fall.  Patient found to be in new onset A-fib with rates as high as 170 by EMS.  Received 20 mg of Cardizem and 750 of normal saline prior to arrival.  Patient arrives in rapid A-fib.  Currently endorses low back pain but denies chest pain, shortness of breath, headache, fever or other systemic symptoms.   Past Medical History Past Medical History:  Diagnosis Date   Arthritis    COVID    has had Covid 3 times, not sick with it   COVID    has had 3 times mild to no symptoms   Depression    Diabetes mellitus without complication (HCC)    pt states he has now been told that he is a type 1 diabetic.   Dyspnea    albuterol inhaler   HLD (hyperlipidemia)    Hypertension    Peripheral vascular disease [facility])    Patient Active Problem List   Diagnosis Date Noted   Malnutrition of moderate degree 12/28/2021   Abscess of right lower leg    Wound infection 12/26/2021   S/P BKA (below knee amputation), right (HCC) 09/30/2021   Below-knee amputation of right lower extremity (HCC) 09/27/2021   Dehiscence of amputation stump of right lower extremity (HCC)    Wound infection after surgery 09/16/2021   Cellulitis of right lower extremity    Peripheral vascular disease (HCC)    History of transmetatarsal amputation of right foot (HCC) 08/25/2021   Osteomyelitis (HCC) 07/24/2021   HTN (hypertension) 07/23/2021   Hyperlipidemia 07/23/2021    Diabetic foot ulcers (HCC) 07/23/2021   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) 01/14/2018   Ulcer of left foot, limited to breakdown of skin (HCC) 01/14/2018   Foot osteomyelitis, left (HCC)    Septic arthritis of foot (HCC) 11/11/2017   Osteomyelitis of ankle or foot, acute, left (HCC) 11/11/2017   Type 2 DM with diabetic peripheral angiopathy w/o gangrene (HCC) 11/11/2017   Cigarette smoker 11/11/2017   Diabetic peripheral neuropathy (HCC) 11/11/2017   Diabetic foot infection (HCC) 11/10/2017   Post op infection    Delirium 12/07/2014   Cervical vertebral fusion 12/07/2014   Hallucination 12/07/2014   T2DM (type 2 diabetes mellitus) (HCC) 12/07/2014   Mood disorder (HCC) 12/07/2014   Pyrexia    Neck swelling    Home Medication(s) Prior to Admission medications   Medication Sig Start Date End Date Taking? Authorizing Provider  acetaminophen (TYLENOL) 500 MG tablet Take 2 tablets (1,000 mg total) by mouth every 6 (six) hours as needed for mild pain or fever (For the first 3 days, may take 2 tabs (1000 mg total) 4 times daily scheduled following which can take 2 tabs (1000 mg total) 4 times daily as needed.). 01/12/23   Hongalgi, Maximino Greenland, MD  ascorbic acid (VITAMIN C) 1000 MG tablet Take 1 tablet (1,000 mg total) by mouth daily. 01/13/23   Hongalgi,  Maximino Greenland, MD  docusate sodium (COLACE) 100 MG capsule Take 1 capsule (100 mg total) by mouth daily. 01/13/23   Hongalgi, Maximino Greenland, MD  gabapentin (NEURONTIN) 600 MG tablet Take 1 tablet (600 mg total) by mouth 3 (three) times daily. 10/05/21   Angiulli, Mcarthur Rossetti, PA-C  insulin aspart protamine - aspart (NOVOLOG 70/30 MIX) (70-30) 100 UNIT/ML FlexPen Inject 10 Units into the skin 2 (two) times daily with a meal. 01/12/23   Hongalgi, Maximino Greenland, MD  Insulin Pen Needle 32G X 4 MM MISC Use as directed two times daily. 01/12/23   Hongalgi, Maximino Greenland, MD  methocarbamol (ROBAXIN) 500 MG tablet Take 1 tablet (500 mg total) by mouth every 6  (six) hours as needed for muscle spasms. 01/12/23   Hongalgi, Maximino Greenland, MD  Multiple Vitamin (MULTIVITAMIN WITH MINERALS) TABS tablet Take 1 tablet by mouth daily. 01/13/23   Hongalgi, Maximino Greenland, MD  nicotine (NICODERM CQ - DOSED IN MG/24 HOURS) 14 mg/24hr patch Place 1 patch (14 mg total) onto the skin daily. 01/13/23   Hongalgi, Maximino Greenland, MD  oxycodone (OXY-IR) 5 MG capsule Take 1 capsule (5 mg total) by mouth every 6 (six) hours as needed. 02/22/23   Adonis Huguenin, NP  Oxycodone HCl 10 MG TABS Take 0.5 tablets (5 mg total) by mouth every 4 (four) hours as needed. 02/08/23   Adonis Huguenin, NP  polyethylene glycol powder (GLYCOLAX/MIRALAX) 17 GM/SCOOP powder Take 17 g by mouth daily as needed for mild constipation. 01/12/23   Elease Etienne, MD                                                                                                                                    Past Surgical History Past Surgical History:  Procedure Laterality Date   ABDOMINAL AORTOGRAM W/LOWER EXTREMITY N/A 08/11/2021   Procedure: ABDOMINAL AORTOGRAM W/LOWER EXTREMITY;  Surgeon: Leonie Douglas, MD;  Location: [facility] INV[facility];  Service: Cardiovascular;  Laterality: N/A;   AMPUTATION Left 11/15/2017   Procedure: LEFT FOOT 2ND RAY AMPUTATION;  Surgeon: Nadara Mustard, MD;  Location: [facility] OR;  Service: Orthopedics;  Laterality: Left;   AMPUTATION Right 08/25/2021   Procedure: RIGHT TRANSMETATARSAL AMPUTATION;  Surgeon: Nadara Mustard, MD;  Location: [facility] OR;  Service: Orthopedics;  Laterality: Right;   AMPUTATION Right 09/27/2021   Procedure: BELOW KNEE AMPUTATION;  Surgeon: Nadara Mustard, MD;  Location: [facility] OR;  Service: Orthopedics;  Laterality: Right;   APPLICATION OF WOUND VAC  12/27/2021   Procedure: APPLICATION OF WOUND VAC;  Surgeon: Nadara Mustard, MD;  Location: [facility] OR;  Service: Orthopedics;;   CERVICAL FUSION     I & D EXTREMITY Left 12/27/2021   Procedure: LEFT LEG DEBRIDEMENT ULCERS;  Surgeon: Nadara Mustard, MD;   Location: [facility] OR;  Service: Orthopedics;  Laterality: Left;   I & D EXTREMITY Left 01/31/2022  Procedure: LEFT KNEE DEBRIDEMENT AND APPLY TISSUE GRAFT;  Surgeon: Nadara Mustard, MD;  Location: [facility] OR;  Service: Orthopedics;  Laterality: Left;   IR ANGIOGRAM EXTREMITY LEFT  11/14/2017   IR ANGIOGRAM SELECTIVE EACH ADDITIONAL VESSEL  11/14/2017   IR ANGIOGRAM SELECTIVE EACH ADDITIONAL VESSEL  11/14/2017   IR TIB-PERO ART PTA MOD SED  11/14/2017   IR TIB-PERO ART UNI PTA EA ADD VESSEL MOD SED  11/14/2017   IR US GUIDE VASC ACCESS LEFT  11/14/2017   PERIPHERAL VASCULAR INTERVENTION Right 08/11/2021   Procedure: PERIPHERAL VASCULAR INTERVENTION;  Surgeon: Leonie Douglas, MD;  Location: [facility] INV[facility];  Service: Cardiovascular;  Laterality: Right;  rt sfa stent   STUMP REVISION Right 01/09/2023   Procedure: REVISION RIGHT BELOW KNEE AMPUTATION;  Surgeon: Nadara Mustard, MD;  Location: [facility] OR;  Service: Orthopedics;  Laterality: Right;   Family History History reviewed. No pertinent family history.  Social History Social History   Tobacco Use   Smoking status: Every Day    Current packs/day: 0.50    Average packs/day: 0.5 packs/day for 47.0 years (23.5 ttl pk-yrs)    Types: Cigarettes   Smokeless tobacco: Never  Vaping Use   Vaping status: Some Days  Substance Use Topics   Alcohol use: No   Drug use: No   Allergies Ceftriaxone  Review of Systems Review of Systems  Musculoskeletal:  Positive for back pain.  Psychiatric/Behavioral:  Positive for confusion.     Physical Exam Vital Signs  I have reviewed the triage vital signs BP (!) 152/89   Temp 97.6 F (36.4 C) (Oral)   Resp 20   Ht 6\' 4"  (1.93 m)   Wt 97.1 kg   SpO2 99%   BMI 26.05 kg/m   Physical Exam Constitutional:      General: He is not in acute distress.    Appearance: Normal appearance.  HENT:     Head: Normocephalic and atraumatic.     Nose: No congestion or rhinorrhea.  Eyes:     General:        Right eye: No  discharge.        Left eye: No discharge.     Extraocular Movements: Extraocular movements intact.     Pupils: Pupils are equal, round, and reactive to light.  Cardiovascular:     Rate and Rhythm: Tachycardia present. Rhythm irregular.     Heart sounds: No murmur heard. Pulmonary:     Effort: No respiratory distress.     Breath sounds: No wheezing or rales.  Abdominal:     General: There is no distension.     Tenderness: There is abdominal tenderness.  Musculoskeletal:        General: Tenderness present. Normal range of motion.     Cervical back: Normal range of motion.  Skin:    General: Skin is warm and dry.  Neurological:     General: No focal deficit present.     Mental Status: He is alert.     ED Results and Treatments Labs (all labs ordered are listed, but only abnormal results are displayed) Labs Reviewed  CULTURE, BLOOD (ROUTINE X 2)  CULTURE, BLOOD (ROUTINE X 2)  I-STAT CG4 LACTIC ACID, ED  Radiology No results found.  Pertinent labs & imaging results that were available during my care of the patient were reviewed by me and considered in my medical decision making (see MDM for details).  Medications Ordered in ED Medications  diltiazem (CARDIZEM) 125 mg in dextrose 5% 125 mL (1 mg/mL) infusion (has no administration in time range)  lactated ringers bolus 1,000 mL (has no administration in time range)                                                                                                                                     Procedures .Critical Care  Performed by: Glendora Score, MD Authorized by: Glendora Score, MD   Critical care provider statement:    Critical care time (minutes):  30   Critical care was necessary to treat or prevent imminent or life-threatening deterioration of the following conditions:  Cardiac failure    Critical care was time spent personally by me on the following activities:  Development of treatment plan with patient or surrogate, discussions with consultants, evaluation of patient's response to treatment, examination of patient, ordering and review of laboratory studies, ordering and review of radiographic studies, ordering and performing treatments and interventions, pulse oximetry, re-evaluation of patient's condition and review of old charts   (including critical care time)  Medical Decision Making / ED Course   This patient presents to the ED for concern of altered mental status, this involves an extensive number of treatment options, and is a complaint that carries with it a high risk of complications and morbidity.  The differential diagnosis includes infection, metabolic/toxic encephalopathy, hypoglycemia, malperfusion, hypoxia, trauma or other intracranial process  MDM: Patient seen emergency room for evaluation of altered mental status.  Physical exam with tenderness in the T and L-spine, rapid irregular tachycardia.  Laboratory evaluation with a leukocytosis to 18.6, lactic acid 3.7, calcium 12.3, sodium 132.  With elevated lactic acid and leukocytosis with new onset A-fib with RVR patient does meet sepsis criteria and blood cultures obtained, fluid resuscitation begun and broad-spectrum antibiotics initiated.  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  CT head is concerning for brain metastases as well.  Patient started on a dill drip for his A-fib with RVR  with improvement of rates.  Patient will require hospital admission for urosepsis secondary to bilateral pyelonephritis with new onset A-fib with RVR and new metastatic cancer diagnosis.   Additional history obtained: - -External records from outside source obtained and reviewed including: Chart review including previous notes, labs, imaging, consultation notes   Lab Tests: -I  ordered, reviewed, and interpreted labs.   The pertinent results include:   Labs Reviewed  CULTURE, BLOOD (ROUTINE X 2)  CULTURE, BLOOD (ROUTINE X 2)  I-STAT CG4 LACTIC ACID, ED      EKG   EKG Interpretation Date/Time:  Sunday July 28 2023 15:11:41 EST Ventricular Rate:  175 PR Interval:    QRS Duration:  83 QT Interval:  282 QTC Calculation: 482 R Axis:   42  Text Interpretation: Atrial fibrillation with rapid V-rate Confirmed by Cody Pena ([id number]) on 07/28/2023 8:50:20 PM         Imaging Studies ordered: I ordered imaging studies including CT head, chest abdomen pelvis I independently visualized and interpreted imaging. I agree with the radiologist interpretation   Medicines ordered and prescription drug management: Meds ordered this encounter  Medications   diltiazem (CARDIZEM) 125 mg in dextrose 5% 125 mL (1 mg/mL) infusion   lactated ringers bolus 1,000 mL    -I have reviewed the patients home medicines and have made adjustments as needed  Critical interventions Diltiazem, fluids, antibiotics    Cardiac Monitoring: The patient was maintained on a cardiac monitor.  I personally viewed and interpreted the cardiac monitored which showed an underlying rhythm of: A-fib with RVR  Social Determinants of Health:  Factors impacting patients care include: none   Reevaluation: After the interventions noted above, I reevaluated the patient and found that they have :improved  Co morbidities that complicate the patient evaluation  Past Medical History:  Diagnosis Date    Arthritis    COVID    has had Covid 3 times, not sick with it   COVID    has had 3 times mild to no symptoms   Depression    Diabetes mellitus without complication (HCC)    pt states he has now been told that he is a type 1 diabetic.   Dyspnea    albuterol inhaler   HLD (hyperlipidemia)    Hypertension    Peripheral vascular disease (HCC)       Dispostion: I considered admission for this patient, and patient require hospital admission for sepsis secondary to bilateral pyelonephritis, new onset A-fib and new cancer diagnosis     Final Clinical Impression(s) / ED Diagnoses Final diagnoses:  None     @PCDICTATION @    Glendora Score, MD 07/28/23 2051

## 2023-07-28 NOTE — ED Notes (Signed)
ED Provider at bedside. 

## 2023-07-28 NOTE — H&P (Signed)
History and Physical    Patient: Cody Pena GNF:[id number] DOB: Jul 01, 1961 DOA: 07/28/2023 DOS: the patient was seen and examined on 07/29/2023 PCP: [facility]  Patient coming from: Home  Chief Complaint:  Chief Complaint  Patient presents with   Altered Mental Status   Hyperglycemia   Back Pain   HPI: Cody Pena is a 62 y.o. male with medical history significant of peripheral vascular disease s/p BKA, hypertension, insulin-dependent type 2 diabetes, and hyperlipidemia who presents with altered mental status and back pain.  Pt not able to provide much hx as he is lethargic after receiving IV morphine. He has been told of cancer in his imaging and appears depressed. Says his memory has been worse. Feels overwhelmed with his new cancer diagnosis. Denies any current pain although earlier had lower back pain. He is alert and oriented only to self although fully able to follow commands. Tells me he does not want his wife to know of his diagnoses yet.    On arrival, patient was noted to be afebrile but in atrial fibrillation with RVR with heart rates up to 170s, BP of 152/89 on room air.  CBC with leukocytosis of 18K, thrombocytosis of 515, no anemia.  Lactate was elevated 3.7.  CMP with sodium 132, CBG of 228, normal creatinine of 1, hypercalcemia of 12.3.  Troponin was elevated but flat at 21-24.  CT head demonstrated complex cystic and solid 3.4cm mass in left parietal lobe with some mild hyperdensity associated with that could represent mineralization and or hemorrhage.  Surrounding edema and mild regional mass effect without midline shift.  No evidence of acute large vascular infarct.  CT chest demonstrated large heterogeneous mass in the left suprahilar region extending into the left upper lobe.  Left upper lobe bronchus is occluded by the mass.  Multiple pulmonary nodules in the right lung consistent with metastasis.  Mediastinal, bilateral hilar, left  axillary lymphadenopathy consistent with metastasis  CT abdomen and pelvis demonstrated bilateral pyelonephritis, left greater than right.  Stent in the right superior femoral artery may be occluded.  He was started on IV diltiazem infusion, and administered IV vancomycin and cefepime. Review of Systems: unable to review all systems due to the inability of the patient to answer questions. Past Medical History:  Diagnosis Date   Arthritis    COVID    has had Covid 3 times, not sick with it   COVID    has had 3 times mild to no symptoms   Depression    Diabetes mellitus without complication (HCC)    pt states he has now been told that he is a type 1 diabetic.   Dyspnea    albuterol inhaler   HLD (hyperlipidemia)    Hypertension    Peripheral vascular disease (HCC)    Past Surgical History:  Procedure Laterality Date   ABDOMINAL AORTOGRAM W/LOWER EXTREMITY N/A 08/11/2021   Procedure: ABDOMINAL AORTOGRAM W/LOWER EXTREMITY;  Surgeon: Leonie Douglas, MD;  Location: [facility];  Service: Cardiovascular;  Laterality: N/A;   AMPUTATION Left 11/15/2017   Procedure: LEFT FOOT 2ND RAY AMPUTATION;  Surgeon: Nadara Mustard, MD;  Location: [facility] OR;  Service: Orthopedics;  Laterality: Left;   AMPUTATION Right 08/25/2021   Procedure: RIGHT TRANSMETATARSAL AMPUTATION;  Surgeon: Nadara Mustard, MD;  Location: [facility] OR;  Service: Orthopedics;  Laterality: Right;   AMPUTATION Right 09/27/2021   Procedure: BELOW KNEE AMPUTATION;  Surgeon: Nadara Mustard, MD;  Location: [facility] OR;  Service: Orthopedics;  Laterality: Right;   APPLICATION OF WOUND VAC  12/27/2021   Procedure: APPLICATION OF WOUND VAC;  Surgeon: Nadara Mustard, MD;  Location: [facility] OR;  Service: Orthopedics;;   CERVICAL FUSION     I & D EXTREMITY Left 12/27/2021   Procedure: LEFT LEG DEBRIDEMENT ULCERS;  Surgeon: Nadara Mustard, MD;  Location: [facility] OR;  Service: Orthopedics;  Laterality: Left;   I & D EXTREMITY Left 01/31/2022   Procedure: LEFT  KNEE DEBRIDEMENT AND APPLY TISSUE GRAFT;  Surgeon: Nadara Mustard, MD;  Location: [facility] OR;  Service: Orthopedics;  Laterality: Left;   IR ANGIOGRAM EXTREMITY LEFT  11/14/2017   IR ANGIOGRAM SELECTIVE EACH ADDITIONAL VESSEL  11/14/2017   IR ANGIOGRAM SELECTIVE EACH ADDITIONAL VESSEL  11/14/2017   IR TIB-PERO ART PTA MOD SED  11/14/2017   IR TIB-PERO ART UNI PTA EA ADD VESSEL MOD SED  11/14/2017   IR US GUIDE VASC ACCESS LEFT  11/14/2017   PERIPHERAL VASCULAR INTERVENTION Right 08/11/2021   Procedure: PERIPHERAL VASCULAR INTERVENTION;  Surgeon: Leonie Douglas, MD;  Location: [facility];  Service: Cardiovascular;  Laterality: Right;  rt sfa stent   STUMP REVISION Right 01/09/2023   Procedure: REVISION RIGHT BELOW KNEE AMPUTATION;  Surgeon: Nadara Mustard, MD;  Location[facility] OR;  Service: Orthopedics;  Laterality: Right;   Social History:  reports that he has been smoking cigarettes. He has a 23.5 pack-year smoking history. He has never used smokeless tobacco. He reports that he does not drink alcohol and does not use drugs.  Allergies  Allergen Reactions   Ceftriaxone Itching, Nausea And Vomiting and Rash    Noted minutes after starting medication No rash noted with Ancef on 01-31-2022 during general anesthesia Has received cefepime and cefazolin without reaction    History reviewed. No pertinent family history.  Prior to Admission medications   Medication Sig Start Date End Date Taking? Authorizing Provider  atorvastatin (LIPITOR) 10 MG tablet Take 10 mg by mouth daily. 06/05/23  Yes [provider]  pioglitazone (ACTOS) 15 MG tablet Take 15 mg by mouth daily. 04/24/23  Yes [provider]  acetaminophen (TYLENOL) 500 MG tablet Take 2 tablets (1,000 mg total) by mouth every 6 (six) hours as needed for mild pain or fever (For the first 3 days, may take 2 tabs (1000 mg total) 4 times daily scheduled following which can take 2 tabs (1000 mg total) 4 times daily as needed.). 01/12/23    Hongalgi, Maximino Greenland, MD  ascorbic acid (VITAMIN C) 1000 MG tablet Take 1 tablet (1,000 mg total) by mouth daily. 01/13/23   Hongalgi, Maximino Greenland, MD  docusate sodium (COLACE) 100 MG capsule Take 1 capsule (100 mg total) by mouth daily. 01/13/23   Hongalgi, Maximino Greenland, MD  gabapentin (NEURONTIN) 600 MG tablet Take 1 tablet (600 mg total) by mouth 3 (three) times daily. 10/05/21   Angiulli, Mcarthur Rossetti, PA-C  insulin aspart protamine - aspart (NOVOLOG 70/30 MIX) (70-30) 100 UNIT/ML FlexPen Inject 10 Units into the skin 2 (two) times daily with a meal. 01/12/23   Hongalgi, Maximino Greenland, MD  Insulin Pen Needle 32G X 4 MM MISC Use as directed two times daily. 01/12/23   Hongalgi, Maximino Greenland, MD  methocarbamol (ROBAXIN) 500 MG tablet Take 1 tablet (500 mg total) by mouth every 6 (six) hours as needed for muscle spasms. 01/12/23   Hongalgi, Maximino Greenland, MD  Multiple Vitamin (MULTIVITAMIN WITH MINERALS) TABS tablet Take 1  tablet by mouth daily. 01/13/23   Hongalgi, Maximino Greenland, MD  nicotine (NICODERM CQ - DOSED IN MG/24 HOURS) 14 mg/24hr patch Place 1 patch (14 mg total) onto the skin daily. 01/13/23   Hongalgi, Maximino Greenland, MD  oxycodone (OXY-IR) 5 MG capsule Take 1 capsule (5 mg total) by mouth every 6 (six) hours as needed. 02/22/23   Adonis Huguenin, NP  Oxycodone HCl 10 MG TABS Take 0.5 tablets (5 mg total) by mouth every 4 (four) hours as needed. 02/08/23   Adonis Huguenin, NP  polyethylene glycol powder (GLYCOLAX/MIRALAX) 17 GM/SCOOP powder Take 17 g by mouth daily as needed for mild constipation. 01/12/23   Elease Etienne, MD    Physical Exam: Vitals:   07/29/23 0045 07/29/23 0100 07/29/23 0115 07/29/23 0200  BP: 115/68 100/73 101/78   Pulse:   (!) 114   Resp:   20   Temp:   98.7 F (37.1 C)   TempSrc:   Oral   SpO2:   (!) 86%   Weight:    77.7 kg  Height:    6\' 3"  (1.905 m)   Constitutional: NAD, calm, comfortable, lethargic appearing elderly male lying slumped over to right side Eyes: lids and conjunctivae normal ENMT: Mucous  membranes are moist.  Neck: normal, supple Respiratory: clear to auscultation bilaterally, no wheezing, no crackles. Normal respiratory effort. No accessory muscle use.  Cardiovascular: Regular rate and rhythm, no murmurs / rubs / gallops. No extremity edema.  Abdomen: no tenderness, soft, non-distended, no masses palpated  Musculoskeletal: no clubbing / cyanosis. No joint deformity upper and lower extremities.  Normal muscle tone.  Skin: no rashes, lesions, ulcers. No induration Neurologic: CN 2-12 grossly intact. Sensation intact,  Strength 5/5 in all 4. Bilateral and equal hand grip. Intact finger to nose. Alert and oriented to self and thinks its 2025.  Psychiatric: Normal judgment and insight.  Depressed mood.   Data Reviewed:  See HPI   Assessment and Plan: * Sepsis (HCC) -secondary to bilateral pyelonephritis -He presented with atrial fibrillation with RVR and leukocytosis with findings of bilateral pyelonephritis on CT abdomen pelvis -Has received IV vancomycin and cefepime in the ED -Will continue IV cefepime due to ceftriaxone allergy -Will obtain urine culture  Paroxysmal atrial fibrillation with RVR (HCC) - Newly diagnosed atrial fibrillation.  Likely related to his sepsis and newly diagnosed malignancy. Also will r/o PE with CTA chest.  -CT head brain lesion has some concerns for hemorrhage.  Will await MRI brain before consideration of anticoagulation. -CHA2DS2-VASc score of 3  -continue IV diltiazem infusion with goal HR >110 -Keep on IV fluids for hydration with sepsis -check TSH -check echocardiogram  Brain mass -CT head demonstrated complex cystic and solid 3.4cm mass in left parietal lobe with some mild hyperdensity associated with that could represent mineralization and or hemorrhage.  Surrounding edema and mild regional mass effect without midline shift.  No evidence of acute large vascular infarct. -start IV decadron for edema -seizure precaution -need  oncology consult tomorrow  Lung mass -CT chest demonstrated large heterogeneous mass in the left suprahilar region extending into the left upper lobe.  Left upper lobe bronchus is occluded by the mass.  Multiple pulmonary nodules in the right lung consistent with metastasis.  Mediastinal, bilateral hilar, left axillary lymphadenopathy consistent with metastasis -currently stable on room air -needs oncology consult in the morning  T2DM (type 2 diabetes mellitus) (HCC) -insulin-dependent T2DM -keep on resistant SSI while on steroids for cerebral  edema  CVA (cerebral vascular accident) [facility] MRI brain imaging resulted demonstrating mixed cystic and solid enhancing mass to the left occiput lobe.  There was also a punctate acute ischemic nonhemorrhagic infarct at the right parietal cortex.  Acute pulmonary embolism (HCC) - CTA chest demonstrated small subsegmental embolic burden in the right lower lobe with no evidence of right heart strain. -If this was a typical case of thromboembolism provoked by malignancy, normally would anticoagulate. In addition to this, he has new onset atrial fibrillation with CHA2D2VASc of 3 that typically would need anticoagulation as well.  However patient's case is quite complex as he also has a brain mass with high risk of hemorrhage. Unfortunately he is not a candidate for IVC filter placement in any reasonable time frame as he has ongoing sepsis from bilateral pyelonephritis. Case was discussed with neurology Dr. Otelia Limes who will see in consultation and advised discussion with PCCM regarding his pulmonary embolism extension risk.  PCCM was curbsided and literature describes that 1 subsegmental PE may not need treatment unless other thromboses are found.  Bilateral LE venous Doppler ultrasound currently pending.  Although the risk of embolism extension is high in the setting of his prothrombotic state with malignancy, the risk of hemorrhage from brain mass likely is higher  and outweighs the benefit of treating a single subsegmental PE.  -In light of a brain bleed being more catastrophic than potential risk of stroke with a possibly isolated atrial fibrillation secondary to sepsis, would lean towards no anticoagulation until resolution of his infection to be consider for IVC filter placement. -appreciate further recommends and input from neurology  Back pain -CT L spine with heterogenicity of the marrow of L1 that may be chronic changes but metastasis not excluded.  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  S/P BKA (below knee amputation), right (HCC) History of PAD -There is question of possible occluded right superior femoral artery stent on CT abdomen/pelvis but patient denies any claudication or pain      Advance Care Planning: Full  Consults: needs oncology consult in the morning  Family Communication: pt declined notified to wife overnight pending further imaging studies  Severity of Illness: The appropriate patient status for this patient is INPATIENT. Inpatient status is judged to be reasonable and necessary in order to provide the required intensity of service to ensure the patient's safety. The patient's presenting symptoms, physical exam findings, and initial radiographic and laboratory data in the context of their chronic comorbidities is felt to place them at high risk for further clinical deterioration. Furthermore, it is not anticipated that the patient will be medically stable for discharge from the hospital within 2 midnights of admission.   * I certify that at the point of admission it is my clinical judgment that the patient will require inpatient hospital care spanning  beyond 2 midnights from the point of admission due to high intensity of service, high risk for further deterioration and high frequency of surveillance required.*  Author: Anselm Jungling, DO 07/29/2023 3:46 AM  For on call review [email or web address].

## 2023-07-28 NOTE — Assessment & Plan Note (Signed)
History of PAD -There is question of possible occluded right superior femoral artery stent on CT abdomen/pelvis but patient denies any claudication or pain

## 2023-07-29 ENCOUNTER — Inpatient Hospital Stay (HOSPITAL_COMMUNITY): Payer: Medicaid Other

## 2023-07-29 ENCOUNTER — Encounter (HOSPITAL_COMMUNITY): Payer: Self-pay | Admitting: Family Medicine

## 2023-07-29 ENCOUNTER — Other Ambulatory Visit: Payer: Self-pay

## 2023-07-29 DIAGNOSIS — R634 Abnormal weight loss: Secondary | ICD-10-CM | POA: Diagnosis not present

## 2023-07-29 DIAGNOSIS — I2699 Other pulmonary embolism without acute cor pulmonale: Secondary | ICD-10-CM | POA: Diagnosis not present

## 2023-07-29 DIAGNOSIS — R6520 Severe sepsis without septic shock: Secondary | ICD-10-CM | POA: Diagnosis not present

## 2023-07-29 DIAGNOSIS — J90 Pleural effusion, not elsewhere classified: Secondary | ICD-10-CM | POA: Diagnosis not present

## 2023-07-29 DIAGNOSIS — G9341 Metabolic encephalopathy: Secondary | ICD-10-CM | POA: Diagnosis not present

## 2023-07-29 DIAGNOSIS — R0609 Other forms of dyspnea: Secondary | ICD-10-CM

## 2023-07-29 DIAGNOSIS — I4891 Unspecified atrial fibrillation: Secondary | ICD-10-CM | POA: Insufficient documentation

## 2023-07-29 DIAGNOSIS — Z86711 Personal history of pulmonary embolism: Secondary | ICD-10-CM | POA: Diagnosis not present

## 2023-07-29 DIAGNOSIS — I4819 Other persistent atrial fibrillation: Secondary | ICD-10-CM

## 2023-07-29 DIAGNOSIS — R918 Other nonspecific abnormal finding of lung field: Secondary | ICD-10-CM | POA: Diagnosis not present

## 2023-07-29 DIAGNOSIS — I480 Paroxysmal atrial fibrillation: Secondary | ICD-10-CM | POA: Insufficient documentation

## 2023-07-29 DIAGNOSIS — A419 Sepsis, unspecified organism: Secondary | ICD-10-CM | POA: Diagnosis not present

## 2023-07-29 DIAGNOSIS — I639 Cerebral infarction, unspecified: Secondary | ICD-10-CM | POA: Insufficient documentation

## 2023-07-29 LAB — GLUCOSE, CAPILLARY
Glucose-Capillary: 188 mg/dL — ABNORMAL HIGH (ref 70–99)
Glucose-Capillary: 208 mg/dL — ABNORMAL HIGH (ref 70–99)
Glucose-Capillary: 208 mg/dL — ABNORMAL HIGH (ref 70–99)
Glucose-Capillary: 171 mg/dL — ABNORMAL HIGH (ref 70–99)

## 2023-07-29 LAB — HEPARIN LEVEL (UNFRACTIONATED): Heparin Unfractionated: <0.1 [IU]/mL — ABNORMAL LOW (ref 0.30–0.70)

## 2023-07-29 LAB — COMPREHENSIVE METABOLIC PANEL
ALT: 11 U/L (ref 0–44)
AST: 15 U/L (ref 15–41)
Albumin: 2 g/dL — ABNORMAL LOW (ref 3.5–5.0)
Alkaline Phosphatase: 85 U/L (ref 38–126)
Anion gap: 9 (ref 5–15)
BUN: 30 mg/dL — ABNORMAL HIGH (ref 8–23)
CO2: 24 mmol/L (ref 22–32)
Calcium: 12.3 mg/dL — ABNORMAL HIGH (ref 8.9–10.3)
Chloride: 97 mmol/L — ABNORMAL LOW (ref 98–111)
Creatinine, Ser: 1.19 mg/dL (ref 0.61–1.24)
GFR, Estimated: >60 mL/min (ref >60)
Glucose, Bld: 189 mg/dL — ABNORMAL HIGH (ref 70–99)
Potassium: 4.5 mmol/L (ref 3.5–5.1)
Sodium: 130 mmol/L — ABNORMAL LOW (ref 135–145)
Total Bilirubin: 0.6 mg/dL (ref <1.2)
Total Protein: 6.5 g/dL (ref 6.5–8.1)

## 2023-07-29 LAB — URINALYSIS, ROUTINE W REFLEX MICROSCOPIC
Bilirubin Urine: NEGATIVE
Glucose, UA: >=500 mg/dL — AB
Ketones, ur: NEGATIVE mg/dL
Nitrite: NEGATIVE
Protein, ur: NEGATIVE mg/dL
Specific Gravity, Urine: >1.046 — ABNORMAL HIGH (ref 1.005–1.030)
pH: 5 (ref 5.0–8.0)

## 2023-07-29 LAB — CBC
HCT: 35.7 % — ABNORMAL LOW (ref 39.0–52.0)
Hemoglobin: 11.3 g/dL — ABNORMAL LOW (ref 13.0–17.0)
MCH: 26.3 pg (ref 26.0–34.0)
MCHC: 31.7 g/dL (ref 30.0–36.0)
MCV: 83.2 fL (ref 80.0–100.0)
Platelets: 391 10*3/uL (ref 150–400)
RBC: 4.29 MIL/uL (ref 4.22–5.81)
RDW: 14.4 % (ref 11.5–15.5)
WBC: 18.7 10*3/uL — ABNORMAL HIGH (ref 4.0–10.5)
nRBC: 0 % (ref 0.0–0.2)

## 2023-07-29 LAB — PROCALCITONIN: Procalcitonin: <0.1 ng/mL

## 2023-07-29 LAB — ECHOCARDIOGRAM COMPLETE
AR max vel: 2.73 cm2
AV Peak grad: 9.7 mm[Hg]
Ao pk vel: 1.56 m/s
Area-P 1/2: 5.84 cm2
Height: 75 in
S' Lateral: 4.6 cm
Weight: 2740.76 [oz_av]

## 2023-07-29 LAB — BRAIN NATRIURETIC PEPTIDE: B Natriuretic Peptide: 250.4 pg/mL — ABNORMAL HIGH (ref 0.0–100.0)

## 2023-07-29 LAB — C-REACTIVE PROTEIN: CRP: 18.1 mg/dL — ABNORMAL HIGH (ref <1.0)

## 2023-07-29 LAB — MAGNESIUM: Magnesium: 1.9 mg/dL (ref 1.7–2.4)

## 2023-07-29 LAB — TSH: TSH: 1.533 u[IU]/mL (ref 0.350–4.500)

## 2023-07-29 MED ORDER — DILTIAZEM HCL 60 MG PO TABS
90.0000 mg | ORAL_TABLET | Freq: Three times a day (TID) | ORAL | Status: DC
  Administered 2023-07-29 – 2023-07-30 (×3): 90 mg via ORAL
  Filled 2023-07-29 (×3): qty 1

## 2023-07-29 MED ORDER — METOPROLOL TARTRATE 5 MG/5ML IV SOLN
5.0000 mg | Freq: Four times a day (QID) | INTRAVENOUS | Status: DC
  Administered 2023-07-29 – 2023-07-30 (×2): 5 mg via INTRAVENOUS
  Filled 2023-07-29 (×2): qty 5

## 2023-07-29 MED ORDER — HEPARIN (PORCINE) 25000 UT/250ML-% IV SOLN
1500.0000 [IU]/h | INTRAVENOUS | Status: AC
  Administered 2023-07-29: 1200 [IU]/h via INTRAVENOUS
  Filled 2023-07-29: qty 250

## 2023-07-29 MED ORDER — HALOPERIDOL LACTATE 5 MG/ML IJ SOLN
2.0000 mg | Freq: Four times a day (QID) | INTRAMUSCULAR | Status: DC | PRN
  Administered 2023-07-30 – 2023-08-05 (×5): 2 mg via INTRAMUSCULAR
  Filled 2023-07-29 (×6): qty 1

## 2023-07-29 MED ORDER — SODIUM CHLORIDE 0.9 % IV SOLN
INTRAVENOUS | Status: DC
Start: 2023-07-29 — End: 2023-07-29

## 2023-07-29 MED ORDER — DIGOXIN 125 MCG PO TABS
0.2500 mg | ORAL_TABLET | Freq: Every day | ORAL | Status: DC
  Administered 2023-07-31 – 2023-08-02 (×3): 0.25 mg via ORAL
  Filled 2023-07-29 (×3): qty 2

## 2023-07-29 MED ORDER — IOHEXOL 350 MG/ML SOLN
75.0000 mL | Freq: Once | INTRAVENOUS | Status: AC | PRN
  Administered 2023-07-29: 75 mL via INTRAVENOUS

## 2023-07-29 MED ORDER — DILTIAZEM HCL 25 MG/5ML IV SOLN: 10.0000 mg | Freq: Four times a day (QID) | INTRAVENOUS | Status: DC | PRN

## 2023-07-29 MED ORDER — HYDROCODONE-ACETAMINOPHEN 5-325 MG PO TABS
1.0000 | ORAL_TABLET | Freq: Four times a day (QID) | ORAL | Status: DC | PRN
  Administered 2023-07-29 – 2023-07-31 (×3): 1 via ORAL
  Administered 2023-08-02: 2 via ORAL
  Administered 2023-08-03 – 2023-08-04 (×3): 1 via ORAL
  Administered 2023-08-05: 2 via ORAL
  Administered 2023-08-05: 1 via ORAL
  Filled 2023-07-29 (×4): qty 1
  Filled 2023-07-29: qty 2
  Filled 2023-07-29 (×2): qty 1
  Filled 2023-07-29: qty 2
  Filled 2023-07-29: qty 1

## 2023-07-29 MED ORDER — DIGOXIN 0.25 MG/ML IJ SOLN
0.1250 mg | Freq: Four times a day (QID) | INTRAMUSCULAR | Status: AC
Start: 2023-07-29 — End: 2023-07-29
  Administered 2023-07-29 (×2): 0.125 mg via INTRAVENOUS
  Filled 2023-07-29 (×2): qty 0.5

## 2023-07-29 MED ORDER — ASPIRIN 81 MG PO TBEC
81.0000 mg | DELAYED_RELEASE_TABLET | Freq: Every day | ORAL | Status: DC
  Administered 2023-07-29 – 2023-08-05 (×7): 81 mg via ORAL
  Filled 2023-07-29 (×7): qty 1

## 2023-07-29 MED ORDER — DIGOXIN 125 MCG PO TABS: 0.2500 mg | ORAL_TABLET | Freq: Every day | ORAL | Status: DC

## 2023-07-29 MED ORDER — TRAMADOL HCL 50 MG PO TABS
50.0000 mg | ORAL_TABLET | Freq: Four times a day (QID) | ORAL | Status: DC | PRN
  Administered 2023-07-29: 50 mg via ORAL
  Filled 2023-07-29: qty 1

## 2023-07-29 MED ORDER — METOPROLOL TARTRATE 5 MG/5ML IV SOLN
5.0000 mg | Freq: Three times a day (TID) | INTRAVENOUS | Status: DC | PRN
  Administered 2023-07-29: 5 mg via INTRAVENOUS
  Filled 2023-07-29: qty 5

## 2023-07-29 MED ORDER — LACTATED RINGERS IV BOLUS
1000.0000 mL | Freq: Once | INTRAVENOUS | Status: AC
  Administered 2023-07-29: 1000 mL via INTRAVENOUS

## 2023-07-29 MED ORDER — ZOLEDRONIC ACID 4 MG/5ML IV CONC
4.0000 mg | Freq: Once | INTRAVENOUS | Status: AC
  Administered 2023-07-30: 4 mg via INTRAVENOUS
  Filled 2023-07-29: qty 5

## 2023-07-29 MED ORDER — ATORVASTATIN CALCIUM 10 MG PO TABS
10.0000 mg | ORAL_TABLET | Freq: Every evening | ORAL | Status: DC
Start: 2023-07-29 — End: 2023-08-06
  Administered 2023-07-29 – 2023-08-05 (×8): 10 mg via ORAL
  Filled 2023-07-29 (×8): qty 1

## 2023-07-29 MED ORDER — MORPHINE SULFATE 2 MG/ML IJ SOLN
2.0000 mg | INTRAMUSCULAR | Status: DC | PRN
  Administered 2023-07-30 – 2023-08-01 (×4): 2 mg via INTRAVENOUS
  Filled 2023-07-29 (×4): qty 1

## 2023-07-29 MED ORDER — SODIUM CHLORIDE 0.9 % IV SOLN: INTRAVENOUS | Status: AC

## 2023-07-29 NOTE — Consult Note (Addendum)
[facility] CONSULT NOTE  Patient Care Team: Healthcare, [facility] Family as PCP - General (Family Medicine) Nadara Mustard, MD as Consulting Physician (Orthopedic Surgery)  CHIEF COMPLAINTS/PURPOSE OF CONSULTATION:  Lung mass, Brain mass  REFERRING PHYSICIAN: Dr. Susa Raring  HISTORY OF PRESENTING ILLNESS:  Cody Pena 62 y.o. male who came into the ED on 07/28/2023 with complaints of a 1 week history of altered mental status and back pain.  Also notes his memory has been progressively worse. Workup was done in the ED, patient was noted to have leukocytosis and thrombocytosis in the absence of anemia.  Hypercalcemia also noted.   CT scan of head was done and showed a 3.4 cm mass in the left parietal lobe.  CT of chest demonstrated a large heterogeneous mass of left upper lobe.  Oncology consult therefore requested. Patient assessed and examined today.  He admits to increasing fatigue, and weakness including nausea, vomiting, diarrhea, poor appetite, with increasing abdominal pain for "a long time".  He also admits to feeling dizzy for a long time, denies headaches.  States he had a prosthesis for his right leg over a year ago; however has had multiple revisions due to infection.  Now reports pain and tenderness to touch on the left anterior chest wall and left upper extremity.    Past medical history is significant for peripheral vascular disease status post right BKA, hypertension, diabetes, and hyperlipidemia. Surgical history as stated includes right BKA in 2023.  Social history significant for tobacco use with 1/2 pack/ day for 47 years, reports that he stopped smoking 3 weeks ago.  Denies alcohol use, denies illicit drug use.  I have reviewed his chart and materials related to his cancer extensively and collaborated history with the patient. Summary of oncologic history is as follows: Oncology History   No history exists.    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  Mediastinal, bilateral hilar and, left axillary lymphadenopathy consistent with mets, multiple pulmonary nodules right lung consistent with mets. -CT of head shows 3.4 cm mass left parietal lobe concerning for mets. -MRI of brain shows 4 cm mass left occipital lobe concerning for mets. -MRI of spine shows heterogeneous enhancement L1 concerning for mets.  No other evidence of mets and lumbar spine. -Patient has not told his wife of suspected cancer dx and does not want her to know yet.  -Patient needs biopsy of lung mass for definitive diagnosis and for purposes of treatment planning. -Dr. Gasper Sells Onc following closely  2.  Acute pulmonary embolism -Per CT angio done today 07/29/2023, small subsecond mental embolic burden in right lower lobe. -Lower extremity venous duplex done today.  Results pending -On IV heparin, started today.   -Monitor closely for bleeding -Patient is currently not a candidate for IVC filter -AC recommendations requested from heme-onc  3.  Hypercalcemia -Elevated calcium 12.3 -Likely related to malignancy -Continue IV hydration -recommend bisphosphonate administration with IV Zometa 4 mg, ordered x1 to be given today -monitor trend for calcium level  4.  Leukocytosis/Sepsis -Elevated WBC 18.7 today -Likely due to to infectious process/pyelonephritis -Continue antibiotics as ordered -Fever curve  5.  History of PAD -status post right BKA -Status post revisions due to infection  6.  A-fib -newly diagnosed -Recommend cardio eval  7. Encephalopathy/altered mental status -EEG done today 07/29/23 suggestive of  moderate diffuse encephalopathy. -Neuro following  Orders Placed This Encounter  Procedures   Critical Care    This order was created via procedure documentation     Standing Status:   Standing    Number of Occurrences:   1   Blood culture (routine x 2)    Standing Status:   Standing    Number of Occurrences:   2   CT CHEST ABDOMEN PELVIS W CONTRAST    Standing Status:   Standing    Number of Occurrences:   1    Does the patient have a contrast media/X-ray dye allergy?:   No    If indicated for the ordered procedure, I authorize the administration of oral contrast media per Radiology protocol:   Yes   CT L-SPINE NO CHARGE    Standing Status:   Standing    Number of Occurrences:   1   CT Head Wo Contrast    Standing Status:   Standing    Number of Occurrences:   1   CT Angio Chest Pulmonary Embolism (PE) W or WO Contrast    Standing Status:   Standing    Number of Occurrences:   1    Does the patient have a contrast media/X-ray dye allergy?:   No    If indicated for the ordered procedure, I authorize the administration of contrast media per Radiology protocol:   Yes   MR BRAIN W WO CONTRAST    Standing Status:   Standing    Number of Occurrences:   1    If indicated for the ordered procedure, I authorize the administration of contrast media per Radiology protocol:   Yes    What is the patient's sedation requirement?:   No Sedation    Does the patient have a pacemaker or implanted devices?:   No   MR Lumbar Spine W Wo Contrast    Standing Status:   Standing    Number of Occurrences:   1    If indicated for the ordered procedure, I authorize the administration of contrast media per Radiology protocol:   Yes    What is the patient's sedation requirement?:   No Sedation    Does the patient have a pacemaker or implanted devices?:   No   CT ANGIO HEAD NECK W WO CM    Standing Status:   Standing    Number of Occurrences:   1    Does the patient have a contrast media/X-ray dye allergy?:   No    If indicated for the ordered procedure, I authorize the administration of contrast media per Radiology protocol:   Yes   Comprehensive metabolic panel     Standing Status:   Standing    Number of Occurrences:   1   CBC with Differential    Standing Status:   Standing    Number of Occurrences:   1   CBC    Standing Status:   Standing    Number of Occurrences:   1   Hemoglobin A1c    To assess prior glycemic control    Standing Status:   Standing    Number of Occurrences:   1   Comprehensive metabolic panel    Standing Status:   Standing    Number of Occurrences:   1   Urinalysis, Routine w reflex microscopic -Urine, Clean Catch    Standing Status:   Standing    Number of Occurrences:   1  Specimen Source:   Urine, Clean Catch [[id number]]   TSH    Standing Status:   Standing    Number of Occurrences:   1   Brain natriuretic peptide    Standing Status:   Standing    Number of Occurrences:   1    Specimen collection method:   Unit=Unit collect   Procalcitonin    Standing Status:   Standing    Number of Occurrences:   1    Specimen collection method:   Unit=Unit collect   Magnesium    Standing Status:   Standing    Number of Occurrences:   1    Specimen collection method:   Unit=Unit collect   C-reactive protein    Standing Status:   Standing    Number of Occurrences:   1    Specimen collection method:   Unit=Unit collect   Magnesium    Standing Status:   Standing    Number of Occurrences:   4    Specimen collection method:   Unit=Unit collect   C-reactive protein    Standing Status:   Standing    Number of Occurrences:   4    Specimen collection method:   Unit=Unit collect   Comprehensive metabolic panel    Standing Status:   Standing    Number of Occurrences:   5    Specimen collection method:   Unit=Unit collect   Procalcitonin    Standing Status:   Standing    Number of Occurrences:   5    Specimen collection method:   Unit=Unit collect   Phosphorus    Standing Status:   Standing    Number of Occurrences:   4    Specimen collection method:   Unit=Unit collect   CBC with Differential/Platelet    Standing Status:    Standing    Number of Occurrences:   5    Specimen collection method:   Unit=Unit collect   Brain natriuretic peptide    Standing Status:   Standing    Number of Occurrences:   4    Specimen collection method:   Unit=Unit collect   Glucose, capillary    Standing Status:   Standing    Number of Occurrences:   1   Heparin level (unfractionated)    Standing Status:   Standing    Number of Occurrences:   1    Specimen collection method:   Unit=Unit collect   Heparin level (unfractionated)    Standing Status:   Standing    Number of Occurrences:   9    Specimen collection method:   Unit=Unit collect   Parathyroid hormone, intact (no Ca)    Standing Status:   Standing    Number of Occurrences:   1    Specimen collection method:   Unit=Unit collect   PTH-related peptide    Standing Status:   Standing    Number of Occurrences:   1    Specimen collection method:   Unit=Unit collect   Diet regular Room service appropriate? Yes with Assist; Fluid consistency: Thin    Standing Status:   Standing    Number of Occurrences:   1    Room service appropriate?:   Yes with Assist    Fluid consistency::   Thin   ED Cardiac monitoring    Standing Status:   Standing    Number of Occurrences:   1   Vital signs    Standing Status:  Standing    Number of Occurrences:   1   Notify physician (specify)    Standing Status:   Standing    Number of Occurrences:   20    Notify Physician:   for pulse less than 55 or greater than 120    Notify Physician:   for respiratory rate less than 12 or greater than 25    Notify Physician:   for temperature greater than 100.5 F    Notify Physician:   for urinary output less than 30 mL/hr for four hours    Notify Physician:   for systolic BP less than 90 or greater than 160, diastolic BP less than 60 or greater than 100    Notify Physician:   for new hypoxia w/ oxygen saturations < 88%   Mobility Protocol: No Restrictions    RN to initiate protocols based on  patient's level of care    Standing Status:   Standing    Number of Occurrences:   1   Refer to Sidebar Report Refer to ICU, Med-Surg, Progressive, and Step-Down Mobility Protocol Sidebars    Refer to ICU, Med-Surg, Progressive, and Step-Down Mobility Protocol Sidebars    Standing Status:   Standing    Number of Occurrences:   1   Do not place and if present remove PureWick    Standing Status:   Standing    Number of Occurrences:   1   Initiate Oral Care Protocol    Standing Status:   Standing    Number of Occurrences:   1   Initiate Carrier Fluid Protocol    Standing Status:   Standing    Number of Occurrences:   1   RN may order General Admission PRN Orders utilizing "General Admission PRN medications" (through manage orders) for the following patient needs: allergy symptoms (Claritin), cold sores (Carmex), cough (Robitussin DM), eye irritation (Liquifilm Tears), hemorrhoids (Tucks), indigestion (Maalox), minor skin irritation (Hydrocortisone Cream), muscle pain (Ben Gay), nose irritation (saline nasal spray) and sore throat (Chloraseptic spray).    Standing Status:   Standing    Number of Occurrences:   [phone]   SCDs    Standing Status:   Standing    Number of Occurrences:   1    Laterality:   Bilateral   Cardiac Monitoring - Continuous Indefinite    Standing Status:   Standing    Number of Occurrences:   1    Indications for use::   Ongoing rate control   Apply Diabetes Mellitus Care Plan    Standing Status:   Standing    Number of Occurrences:   1   STAT CBG when hypoglycemia is suspected. If treated, recheck every 15 minutes after each treatment until CBG >/= 70 mg/dl    Standing Status:   Standing    Number of Occurrences:   1   Refer to Hypoglycemia Protocol Sidebar Report for treatment of CBG < 70 mg/dl    Standing Status:   Standing    Number of Occurrences:   1   Up in chair    Standing Status:   Standing    Number of Occurrences:   1   Must be up for meals     Standing Status:   Standing    Number of Occurrences:   1   Assist with feeding patient    Standing Status:   Standing    Number of Occurrences:   1   Elevate head of bed  Standing Status:   Standing    Number of Occurrences:   1    Number of degrees:   35 Degrees   Ambulate in room    Standing Status:   Standing    Number of Occurrences:   1   Check Pulse Oximetry while ambulating    On Room Air    Standing Status:   Standing    Number of Occurrences:   1   Full code    Standing Status:   Standing    Number of Occurrences:   1    By::   Consent: discussion documented in EHR   Consult for Unassigned Medical Admission    Standing Status:   Standing    Number of Occurrences:   1    Place call to::   on call for unassigned    Reason for Consult:   Admit   CeFEPIme (MAXIPIME) per pharmacy consult             Standing Status:   Standing    Number of Occurrences:   1    Antibiotic Indication::   UTI   heparin per pharmacy consult    Standing Status:   Standing    Number of Occurrences:   1    Indication::   Pulmonary Embolism    Comment::   Initiate without bolus   OT eval and treat    Standing Status:   Standing    Number of Occurrences:   1    Reason for OT?:   weakness   PT eval and treat    Standing Status:   Standing    Number of Occurrences:   1    Reason for PT?:   weakness, placement   Oxygen therapy Mode or (Route): Nasal cannula; Liters Per Minute: 2; Keep O2 saturation between: greater than 92 %    Standing Status:   Standing    Number of Occurrences:   1    Mode or (Route):   Nasal cannula    Liters Per Minute:   2    Keep O2 saturation between:   greater than 92 %   Incentive spirometry RT    Standing Status:   Standing    Number of Occurrences:   1   SLP eval and treat Reason for evaluation: .Swallowing evaluation (BSE, MBS and/or diet order as indicated)    Standing Status:   Standing    Number of Occurrences:   1    Reason for evaluation:    .Swallowing evaluation (BSE, MBS and/or diet order as indicated)   I-Stat CG4 Lactic Acid    Standing Status:   Standing    Number of Occurrences:   2   CBG monitoring, ED    Standing Status:   Standing    Number of Occurrences:   1   EKG 12-Lead    Standing Status:   Standing    Number of Occurrences:   1   ECHOCARDIOGRAM COMPLETE    Standing Status:   Standing    Number of Occurrences:   1    Perflutren DEFINITY (image enhancing agent) should be administered unless hypersensitivity or allergy exist:   Administer Perflutren    Reason for exam-Echo:   Atrial Fibrillation  I48.91   EEG adult    Standing Status:   Standing    Number of Occurrences:   1    Reason for exam:   Altered mental status   Admit to Inpatient (  patient's expected length of stay will be greater than 2 midnights or inpatient only procedure)    Standing Status:   Standing    Number of Occurrences:   1    Hospital Area:   [facility] [[id number]]    Level of Care:   Progressive [102]    Admit to Progressive based on following criteria:   NEUROLOGICAL AND NEUROSURGICAL complex patients with significant risk of instability, who do not meet ICU criteria, yet require close observation or frequent assessment (< / = every 2 - 4 hours) with medical / nursing intervention.    May admit patient to [facility] or [facility] if equivalent level of care is available::   No    Covid Evaluation:   Asymptomatic - no recent exposure (last 10 days) testing not required    Diagnosis:   Sepsis [facility]) [[id number]]    Admitting Physician:   Anselm Jungling [[id number]]    Attending Physician:   Anselm Jungling [[id number]]    Certification::   I certify this patient will need inpatient services for at least 2 midnights    Expected Medical Readiness:   07/31/2023   Seizure precautions    Standing Status:   Standing    Number of Occurrences:   1   Aspiration precautions    Standing Status:   Standing    Number of Occurrences:   1      MEDICAL HISTORY:  Past Medical History:  Diagnosis Date   Arthritis    COVID    has had Covid 3 times, not sick with it   COVID    has had 3 times mild to no symptoms   Depression    Diabetes mellitus without complication (HCC)    pt states he has now been told that he is a type 1 diabetic.   Dyspnea    albuterol inhaler   HLD (hyperlipidemia)    Hypertension    Peripheral vascular disease (HCC)     SURGICAL HISTORY: Past Surgical History:  Procedure Laterality Date   ABDOMINAL AORTOGRAM W/LOWER EXTREMITY N/A 08/11/2021   Procedure: ABDOMINAL AORTOGRAM W/LOWER EXTREMITY;  Surgeon: Leonie Douglas, MD;  Location: [facility] INV[facility]IVE [facility];  Service: Cardiovascular;  Laterality: N/A;   AMPUTATION Left 11/15/2017   Procedure: LEFT FOOT 2ND RAY AMPUTATION;  Surgeon: Nadara Mustard, MD;  Location: [facility] OR;  Service: Orthopedics;  Laterality: Left;   AMPUTATION Right 08/25/2021   Procedure: RIGHT TRANSMETATARSAL AMPUTATION;  Surgeon: Nadara Mustard, MD;  Location: [facility] OR;  Service: Orthopedics;  Laterality: Right;   AMPUTATION Right 09/27/2021   Procedure: BELOW KNEE AMPUTATION;  Surgeon: Nadara Mustard, MD;  Location: [facility] OR;  Service: Orthopedics;  Laterality: Right;   APPLICATION OF WOUND VAC  12/27/2021   Procedure: APPLICATION OF WOUND VAC;  Surgeon: Nadara Mustard, MD;  Location: [facility] OR;  Service: Orthopedics;;   CERVICAL FUSION     I & D EXTREMITY Left 12/27/2021   Procedure: LEFT LEG DEBRIDEMENT ULCERS;  Surgeon: Nadara Mustard, MD;  Location: [facility] OR;  Service: Orthopedics;  Laterality: Left;   I & D EXTREMITY Left 01/31/2022   Procedure: LEFT KNEE DEBRIDEMENT AND APPLY TISSUE GRAFT;  Surgeon: Nadara Mustard, MD;  Location: [facility] OR;  Service: Orthopedics;  Laterality: Left;   IR ANGIOGRAM EXTREMITY LEFT  11/14/2017   IR ANGIOGRAM SELECTIVE EACH ADDITIONAL VESSEL  11/14/2017   IR ANGIOGRAM SELECTIVE EACH ADDITIONAL VESSEL  11/14/2017   IR  TIB-PERO ART PTA MOD SED  11/14/2017   IR TIB-PERO ART UNI  PTA EA ADD VESSEL MOD SED  11/14/2017   IR US GUIDE VASC ACCESS LEFT  11/14/2017   PERIPHERAL VASCULAR INTERVENTION Right 08/11/2021   Procedure: PERIPHERAL VASCULAR INTERVENTION;  Surgeon: Leonie Douglas, MD;  Location: [facility];  Service: Cardiovascular;  Laterality: Right;  rt sfa stent   STUMP REVISION Right 01/09/2023   Procedure: REVISION RIGHT BELOW KNEE AMPUTATION;  Surgeon: Nadara Mustard, MD;  Location: [facility] OR;  Service: Orthopedics;  Laterality: Right;    SOCIAL HISTORY: Social History   Socioeconomic History   Marital status: Married    Spouse name: Not on file   Number of children: Not on file   Years of education: Not on file   Highest education level: Not on file  Occupational History   Not on file  Tobacco Use   Smoking status: Every Day    Current packs/day: 0.50    Average packs/day: 0.5 packs/day for 47.0 years (23.5 ttl pk-yrs)    Types: Cigarettes   Smokeless tobacco: Never  Vaping Use   Vaping status: Some Days  Substance and Sexual Activity   Alcohol use: No   Drug use: No   Sexual activity: Not on file  Other Topics Concern   Not on file  Social History Narrative   Not on file   Social Drivers of Health   Financial Resource Strain: Not on File (01/10/2023)   Received from [employer] [employer]    Financial Resource Strain: 0  Food Insecurity: No Food Insecurity (07/29/2023)   Hunger Vital Sign    Worried About Running Out of Food in the Last Year: Never true    Ran Out of Food in the Last Year: Never true  Transportation Needs: No Transportation Needs (07/29/2023)   PRAPARE - Administrator, Civil Service (Medical): No    Lack of Transportation (Non-Medical): No  Physical Activity: Not on File (01/10/2023)   Received from [place], [place]   Physical Activity    Physical Activity: 0  Stress: Not on File (01/10/2023)   Received from [facility], [place]   Stress    Stress: 0  Social Connections: Not on File  (01/10/2023)   Received from [place], [place]   Social Connections    Social Connections and Isolation: 0  Intimate Partner Violence: Not At Risk (07/29/2023)   Humiliation, Afraid, Rape, and Kick questionnaire    Fear of Current or Ex-Partner: No    Emotionally Abused: No    Physically Abused: No    Sexually Abused: No    FAMILY HISTORY: History reviewed. No pertinent family history.  REVIEW OF SYSTEMS:   Constitutional: + Weak + fatigue, denies fevers, chills or abnormal night sweats Eyes: Denies blurriness of vision, double vision or watery eyes Ears, nose, mouth, throat, and face: Denies mucositis or sore throat Respiratory: Denies cough, dyspnea or wheezes Cardiovascular: Denies palpitation, chest discomfort or lower extremity swelling Gastrointestinal: + Intermittent nausea, heartburn or change in bowel habits Skin: Denies abnormal skin rashes Lymphatics: Denies new lymphadenopathy or easy bruising Neurological: Denies numbness, tingling or new weaknesses Behavioral/Psych: Mood is stable, no new changes  All other systems were reviewed with the patient and are negative.  PHYSICAL EXAMINATION: ECOG PERFORMANCE STATUS: 3 - Symptomatic, >50% confined to bed  Vitals:   07/29/23 1000 07/29/23 1100  BP:  124/81  Pulse: 99 95  Resp: (!) 22 (!)  22  Temp:    SpO2: 93% 93%   Filed Weights   07/28/23 1518 07/29/23 0200  Weight: 214 lb (97.1 kg) 171 lb 4.8 oz (77.7 kg)    GENERAL: alert, thin appearing, cachectic, no distress and comfortable SKIN: skin color, texture, turgor are normal, no rashes or significant lesions EYES: normal, conjunctiva are pink and non-injected, sclera clear OROPHARYNX: no exudate, no erythema and lips, buccal mucosa, and tongue normal  NECK:  +tender, + cervical lymphadenopathy left greater than right  LYMPH: no palpable lymphadenopathy in the cervical, axillary or inguinal LUNGS: clear to auscultation and percussion with normal breathing  effort HEART: regular rate & rhythm and no murmurs and no lower extremity edema ABDOMEN: abdomen soft, +tender and normal bowel sounds MUSCULOSKELETAL: + R BKA PSYCH: alert & oriented x 3 with fluent speech NEURO: no focal motor/sensory deficits   ALLERGIES:  is allergic to ceftriaxone.  MEDICATIONS:  Current Facility-Administered Medications  Medication Dose Route Frequency Provider Last Rate Last Admin   0.9 %  sodium chloride infusion   Intravenous Continuous Leroy Sea, MD       aspirin EC tablet 81 mg  81 mg Oral Daily Caryl Pina, MD   81 mg at 07/29/23 1019   ceFEPIme (MAXIPIME) 2 g in sodium chloride 0.9 % 100 mL IVPB  2 g Intravenous Q8H Doristine Counter, RPH 200 mL/hr at 07/29/23 1009 2 g at 07/29/23 1009   dexamethasone (DECADRON) injection 4 mg  4 mg Intravenous QHS Tu, Ching T, DO   4 mg at 07/28/23 2319   digoxin (LANOXIN) 0.25 MG/ML injection 0.125 mg  0.125 mg Intravenous Q6H Susa Raring K, MD   0.125 mg at 07/29/23 1037   diltiazem (CARDIZEM) 125 mg in dextrose 5% 125 mL (1 mg/mL) infusion  5-15 mg/hr Intravenous Continuous Kommor, Madison, MD 15 mL/hr at 07/29/23 1006 15 mg/hr at 07/29/23 1006   diltiazem (CARDIZEM) injection 10 mg  10 mg Intravenous Q6H PRN Leroy Sea, MD       diltiazem (CARDIZEM) tablet 90 mg  90 mg Oral Q8H Leroy Sea, MD   90 mg at 07/29/23 1016   heparin ADULT infusion 100 units/mL (25000 units/275mL)  1,200 Units/hr Intravenous Continuous Arma Heading, RPH       insulin aspart (novoLOG) injection 0-20 Units  0-20 Units Subcutaneous TID PC & HS Tu, Ching T, DO   4 Units at 07/29/23 1010   metoprolol tartrate (LOPRESSOR) injection 5 mg  5 mg Intravenous Q8H PRN Leroy Sea, MD   5 mg at 07/29/23 1144   traMADol (ULTRAM) tablet 50 mg  50 mg Oral Q6H PRN Leroy Sea, MD         LABORATORY DATA:  I have reviewed the data as listed Lab Results  Component Value Date   WBC 18.7 (H) 07/29/2023   HGB 11.3  (L) 07/29/2023   HCT 35.7 (L) 07/29/2023   MCV 83.2 07/29/2023   PLT 391 07/29/2023   Recent Labs    01/07/23 0114 01/09/23 0127 01/12/23 0035 07/28/23 1548 07/29/23 0422  NA 134*   < > 132* 132* 130*  K 3.7   < > 4.7 4.5 4.5  CL 100   < > 98 95* 97*  CO2 26   < > 27 24 24   GLUCOSE 187*   < > 187* 228* 189*  BUN 13   < > 21 32* 30*  CREATININE 0.93   < > 0.92  1.01 1.19  CALCIUM 8.1*   < > 8.5* 12.3* 12.3*  GFRNONAA >60   < > >60 >60 >60  PROT 6.4*  --   --  7.3 6.5  ALBUMIN 2.7*  --   --  2.2* 2.0*  AST 10*  --   --  21 15  ALT 11  --   --  13 11  ALKPHOS 66  --   --  95 85  BILITOT 0.4  --   --  0.9 0.6   < > = values in this interval not displayed.    RADIOGRAPHIC STUDIES: I have personally reviewed the radiological images as listed and agreed with the findings in the report. EEG adult Result Date: 07/29/2023 Charlsie Quest, MD     07/29/2023 11:52 AM Patient Name: CYSON COGAN MRN: [id number] Epilepsy Attending: Charlsie Quest Referring Physician/Provider: Caryl Pina, MD Date: 07/29/2023 Duration: 25.56 mins Patient history: 62 yo M presented to the ED yesterday afternoon with a one week history of AMS and back pain. EEG to evaluate for seizure Level of alertness: Awake AEDs during EEG study: None Technical aspects: This EEG study was done with scalp electrodes positioned according to the 10-20 International system of electrode placement. Electrical activity was reviewed with band pass filter of 1-70Hz , sensitivity of 7 uV/mm, display speed of 47mm/sec with a 60Hz  notched filter applied as appropriate. EEG data were recorded continuously and digitally stored.  Video monitoring was available and reviewed as appropriate. Description: No posterior dominant rhythm was seen. EEG showed continuous generalized 3 to 6 Hz theta-delta slowing admixed with 12-14 Hz beta activity distributed symmetrically and diffusely.  Physiologic photic driving was not seen during photic  stimulation.  Hyperventilation was not performed.   ABNORMALITY - Continuous slow, generalized IMPRESSION: This study is suggestive of moderate diffuse encephalopathy. No seizures or epileptiform discharges were seen throughout the recording. Charlsie Quest    CT Angio Chest Pulmonary Embolism (PE) W or WO Contrast Result Date: 07/29/2023 CLINICAL DATA:  Pulmonary embolism suspected. Positive D-dimer. CT yesterday demonstrating a large left upper lobe neoplasm with mediastinal and hilar adenopathy and bronchovascular encasement. EXAM: CT ANGIOGRAPHY CHEST WITH CONTRAST TECHNIQUE: Multidetector CT imaging of the chest was performed using the standard protocol during bolus administration of intravenous contrast. Multiplanar CT image reconstructions and MIPs were obtained to evaluate the vascular anatomy. RADIATION DOSE REDUCTION: This exam was performed according to the departmental dose-optimization program which includes automated exposure control, adjustment of the mA and/or kV according to patient size and/or use of iterative reconstruction technique. CONTRAST:  75mL OMNIPAQUE IOHEXOL 350 MG/ML SOLN COMPARISON:  CT chest, abdomen and pelvis with IV contrast yesterday at 5:28 p.m. (current exam 07/29/2023 at 12:08 a.m.). FINDINGS: Cardiovascular: There is a small embolic burden in the right lower lobe with nonocclusive thrombus within a posterior basal subsegmental branch likely extending into 2 of its downstream small branches. There is no evidence of right heart strain and no further embolus is seen. Bronchovascular tumor encasement narrows the distal left main pulmonary artery and it appears to almost completely occlude the left upper lobe pulmonary artery, and occludes the left superior pulmonary vein. Pulmonary trunk is upper limits of normal in caliber. Cardiac size is normal. There is a small pericardial effusion. There is no venous dilatation. Aortic and three-vessel coronary artery atherosclerosis is  present but no aneurysm, stenosis or dissection, with scattered plaque in the great vessels. Mediastinum/Nodes: Large left upper lobe mass encases and obscures  the hilar structures for the most part. There is undoubtedly left hilar adenopathy but it is difficult to separate from the mass. Multifocal mediastinal adenopathy is seen with subcarinal nodal mass 4.3 x 2.6 cm on 5:74, right paratracheal adenopathy with multiple lymph nodes enlarged for example measuring 2 x 1.9 cm on 5:48, enlarged right hilar nodes measuring as much as 2.4 x 3.1 cm on 5:71, and left axillary adenopathy with the largest of these lymph nodes measuring 2.3 x 2.0 cm on 5:53. The trachea and main bronchi are patent. Unremarkable thoracic esophagus. Lungs/Pleura: Large heterogeneous left upper lobe mass obstructs the left upper lobe bronchus, with no visible remaining aeration in the left upper lobe. Scattered foci of air in the apical portion of the mass could indicate tumoral necrosis or residual air bronchograms versus infectious complication. In total the mass measures 12.7 cm AP on 5:53, 9 cm transverse on 5:44, and 14.6 cm in height. Small layering left and minimal layering right pleural effusions are also again noted as well as multiple right lung nodules consistent with metastases. There is bilateral bronchial thickening but it is proportionately greater in the left lower lobe where there is peribronchovascular interstitial and airspace opacities which could indicate changes of bronchopneumonia or lymphangitic carcinomatosis. Multiple micronodules are noted in this distribution. Examples of some of the larger right lung metastases include a 1.1 cm nodule in the right upper lobe on 6:46, a right lower lobe superior segment nodule measuring 1.4 cm on 6:51, and a 1.3 cm right lower lobe nodule on 6:119. Others are smaller. There are mild centrilobular and paraseptal emphysematous changes in the right upper lobe. Upper Abdomen: No upper  abdominal metastasis is seen. Musculoskeletal: Thoracic spondylosis. Partially visible lower cervical ACDF plating. Multilevel thoracic degenerative disc disease. No destructive bone lesions. Review of the MIP images confirms the above findings. IMPRESSION: 1. Small subsegmental embolic burden in the right lower lobe, with no evidence of right heart strain. 2. Large 12.7 x 9 x 14.6 cm left upper lobe mass with bronchovascular encasement, occluding the left upper lobe bronchus and almost completely occluding the left upper lobe pulmonary artery, and occluding the left superior pulmonary vein. 3. Multiple right lung nodules consistent with metastases. 4. Bilateral bronchial thickening but proportionately greater in the left lower lobe where there is peribronchovascular interstitial and airspace opacities which could indicate changes of bronchopneumonia or lymphangitic carcinomatosis. 5. Small left and minimal right pleural effusions. 6. Multifocal mediastinal, right hilar and left axillary adenopathy. 7. Aortic and coronary artery atherosclerosis. 8. Emphysema. 9. These results will be called to the ordering clinician or representative by the Radiologist Assistant, and communication documented in the PACS or [employer]. Aortic Atherosclerosis (ICD10-I70.0) and Emphysema (ICD10-J43.9). Electronically Signed   By: Almira Bar M.D.   On: 07/29/2023 00:42   MR Lumbar Spine W Wo Contrast Result Date: 07/28/2023 CLINICAL DATA:  Initial evaluation for acute low back pain EXAM: MRI LUMBAR SPINE WITHOUT AND WITH CONTRAST TECHNIQUE: Multiplanar and multiecho pulse sequences of the lumbar spine were obtained without and with intravenous contrast. CONTRAST:  10mL GADAVIST GADOBUTROL 1 MMOL/ML IV SOLN COMPARISON:  Prior CT from earlier the same day. FINDINGS: Segmentation: Standard. Lowest well-formed disc space labeled the L5-S1 level. Alignment: 2 mm degenerative retrolisthesis of L1 on L2 and L2 on L3, with 3 mm  anterolisthesis of L5 on S1. Vertebrae: Abnormal marrow replacement with edema and heterogeneous enhancement seen within the L1 vertebral body, concerning for osseous metastatic disease. Associated mild  pathologic compression fracture measures up to 20% without bony retropulsion. No extra osseous or epidural extension of tumor. No other visible metastatic lesions within the lumbar spine. Bone marrow signal intensity otherwise normal. Prominent degenerative reactive endplate changes noted about the L2-3 interspace. No other abnormal marrow edema or enhancement. Conus medullaris and cauda equina: Conus extends to the T12-L1 no abnormal enhancement. Level. Conus and cauda equina appear normal. Paraspinal and other soft tissues: Changes of acute pyelonephritis involving the left greater than right kidneys noted, described on prior CT. 5.3 cm simple right renal cyst, benign in appearance, no follow-up imaging recommended. Disc levels: T12-L1: Mild diffuse disc bulge with reactive endplate spurring. Mild facet hypertrophy. No spinal stenosis. Foramina remain patent. L1-2: Degenerative intervertebral disc space narrowing with diffuse disc bulge and disc desiccation. Reactive endplate spurring. Mild bilateral facet hypertrophy. Resultant mild narrowing of the lateral recesses bilaterally. Central canal remains patent. Foramina remain adequately patent bilaterally. L2-3: Advanced degenerative intervertebral disc space narrowing with diffuse disc bulge and disc desiccation. Prominent reactive endplate change with marginal endplate osteophytic spurring and marrow edema. Mild to moderate bilateral facet arthrosis. Resultant moderate canal with bilateral subarticular stenosis. Moderate left worse than right L2 foraminal narrowing. L3-4: Diffuse disc bulge with reactive endplate spurring. Superimposed small left foraminal disc protrusion contacts the exiting left L3 nerve root (series 9, image 12). Mild-to-moderate bilateral  facet hypertrophy. Resultant mild narrowing of the lateral recesses bilaterally. Central canal remains patent. Mild to moderate left L3 foraminal stenosis. Right neural foramen remains patent. L4-5: Disc desiccation with diffuse disc bulge. Reactive endplate spurring. Moderate to severe bilateral facet arthrosis with associated small reactive joint effusions. Resultant mild to moderate bilateral subarticular stenosis. Central canal remains patent. Moderate to severe bilateral L4 foraminal narrowing. L5-S1: Anterolisthesis with degenerative intervertebral disc space narrowing. Diffuse disc bulge with reactive endplate spurring. Moderate bilateral facet hypertrophy. No significant spinal stenosis. Moderate bilateral L5 foraminal narrowing. IMPRESSION: 1. Abnormal marrow replacement with heterogeneous enhancement within the L1 vertebral body, concerning for osseous metastatic disease. Associated mild pathologic compression fracture measures up to 20% without bony retropulsion. No extra osseous or epidural extension of tumor. 2. No other evidence for metastatic disease within the lumbar spine. 3. Multilevel lumbar spondylosis with resultant moderate canal stenosis at L2-3. Associated multilevel foraminal narrowing as above, moderate to severe in nature at L4-5 bilaterally. 4. Changes of acute pyelonephritis involving the left greater than right kidneys, described on prior CT. Electronically Signed   By: Rise Mu M.D.   On: 07/28/2023 23:59   MR BRAIN W WO CONTRAST Result Date: 07/28/2023 CLINICAL DATA:  Initial evaluation for brain/CNS neoplasm EXAM: MRI HEAD WITHOUT AND WITH CONTRAST TECHNIQUE: Multiplanar, multiecho pulse sequences of the brain and surrounding structures were obtained without and with intravenous contrast. CONTRAST:  10mL GADAVIST GADOBUTROL 1 MMOL/ML IV SOLN COMPARISON:  Prior CT from 07/28/2023. FINDINGS: Brain: Cerebral volume within normal limits. Patchy T2/FLAIR hyperintensity  involving the periventricular and deep white matter both supra hemispheres, consistent with chronic small vessel ischemic disease, moderately advanced in nature. Punctate acute ischemic nonhemorrhagic infarct present at the right parietal cortex (series 9, image 98). No other evidence for acute or subacute ischemia. Mixed cystic and solid peripherally enhancing mass positioned at the left occipital lobe is seen, measuring 4.0 x 3.5 x 3.7 cm. Mild surrounding vasogenic edema without significant regional mass effect or midline shift. 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 No other visible  mass lesions or abnormal enhancement. No hydrocephalus or extra-axial fluid collection. Pituitary gland suprasellar region within normal limits. Vascular: Major intracranial vascular flow voids are maintained. Skull and upper cervical spine: Craniocervical junction within normal limits. Visualized bone marrow signal intensity within normal limits. Postoperative changes noted within the partially visualized upper cervical spine. Sinuses/Orbits: Globes and orbital soft tissues within normal limits. Paranasal sinuses are largely clear. No significant mastoid effusion. Other: None. IMPRESSION: 1. 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 Mild surrounding vasogenic edema without significant regional mass effect or midline shift. 2. 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 3. Underlying moderately advanced chronic microvascular ischemic disease. Electronically Signed   By: Rise Mu M.D.   On: 07/28/2023 23:48   CT L-SPINE NO CHARGE Result Date: 07/28/2023 CLINICAL DATA:  Back pain after fall December 1st EXAM: CT LUMBAR SPINE WITHOUT CONTRAST TECHNIQUE: Multidetector CT imaging of the lumbar spine was performed  without intravenous contrast administration. Multiplanar CT image reconstructions were also generated. RADIATION DOSE REDUCTION: This exam was performed according to the departmental dose-optimization program which includes automated exposure control, adjustment of the mA and/or kV according to patient size and/or use of iterative reconstruction technique. COMPARISON:  CT 04/05/2016 FINDINGS: Segmentation: 5 lumbar type vertebrae. Alignment: No evidence of traumatic listhesis. Mild grade 1 retrolisthesis of L2 is likely chronic. Vertebrae: Mild superior endplate compression of L1, may be chronic though is age indeterminate without recent comparison. Otherwise no evidence of acute fracture. There is mild heterogeneity of the marrow of L1 and metastasis is not excluded. Consider MRI for further evaluation. Paraspinal and other soft tissues: See separate report for findings in the abdomen and pelvis. Disc levels: Multilevel spondylosis and disc space height loss with degenerative endplate changes greatest at L2-L3 where it is advanced. Mild multilevel facet arthropathy. Spinal canal narrowing is greatest at L2-L3 where it is moderate. IMPRESSION: 1. Mild superior endplate compression of L1 may be chronic though is age indeterminate without recent comparison. 2. Heterogeneity of the marrow of L1. This may be related to chronic changes however metastasis is not excluded. Consider MRI for further evaluation. \ 3. Advanced disc space height loss at L2-L3. 4. See separate report for findings in the chest, abdomen, and pelvis. Electronically Signed   By: Minerva Fester M.D.   On: 07/28/2023 18:50   CT CHEST ABDOMEN PELVIS W CONTRAST Result Date: 07/28/2023 CLINICAL DATA:  Altered mental status for 1 week and back pain after a fall on December 1st. EXAM: CT CHEST, ABDOMEN, AND PELVIS WITH CONTRAST TECHNIQUE: Multidetector CT imaging of the chest, abdomen and pelvis was performed following the standard protocol during  bolus administration of intravenous contrast. RADIATION DOSE REDUCTION: This exam was performed according to the departmental dose-optimization program which includes automated exposure control, adjustment of the mA and/or kV according to patient size and/or use of iterative reconstruction technique. CONTRAST:  75mL OMNIPAQUE IOHEXOL 350 MG/ML SOLN COMPARISON:  CT abdomen and pelvis 01/14/2008 and chest radiographs 12/07/2014 FINDINGS: CT CHEST FINDINGS Cardiovascular: Normal heart size. Small pericardial effusion. Pericardial thickening where it abuts the large left suprahilar mass. Coronary artery and aortic atherosclerotic calcification. The left upper lobe pulmonary arteries are not well visualized and may be compressed or occluded by the mass. Mediastinum/Nodes: Large heterogenous mass in the left suprahilar region extending into the left upper lobe. Mediastinal, bilateral hilar, and left axillary  lymphadenopathy. For example a subcarinal node measures 2.5 cm (series 3/image 27); 1.3 cm right hilar node on series 3/image 34; and a left axillary node measures 2.2 cm (3/19). Trachea and esophagus are unremarkable. Lungs/Pleura: Large heterogenous mass left upper lobe/suprahilar mass consistent with malignancy measuring 12.1 x 8.7 by 12.1 cm. There are a few small locules of gas in the superior portion of the mass which may represent cavitation. The left upper lobe bronchus is occluded by the mass. Diffuse bronchial wall thickening in the lingula and left lower lobe with centrilobular micro nodules and tree-in-bud opacities. 1.6 cm subpleural nodule in the anterior lingula (series 4/image 90). Small left pleural effusion. Multiple pulmonary nodules in the right lung consistent with metastases. For example 12 mm nodule in the posterior right lower lobe (4/128) and 9 mm nodule in the right upper lobe (4/44). Mild emphysema in the right. No right pleural effusion. No pneumothorax. Musculoskeletal: No acute fracture.   No destructive osseous lesion. CT ABDOMEN PELVIS FINDINGS Hepatobiliary: No focal hepatic lesion. Layering sludge in the gallbladder. No biliary dilation or evidence of cholecystitis. Pancreas: Unremarkable. Spleen: Unremarkable. Adrenals/Urinary Tract: Normal adrenal glands. Heterogenous attenuation of the left kidney. Focal geographic hypoattenuation in the posterior right kidney. Mild asymmetric left-greater-than-right perinephric stranding. Findings are compatible with pyelonephritis. No urinary calculi or hydronephrosis. Unremarkable bladder. Stomach/Bowel: Normal caliber large and small bowel without bowel wall thickening. Stomach and appendix are within normal limits. Colonic diverticulosis without diverticulitis. Vascular/Lymphatic: Aortic atherosclerosis. 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 The stent in the right superficial femoral artery is not definitively opacified and may be occluded. No enlarged abdominal or pelvic lymph nodes. Reproductive: No acute abnormality. Other: No free intraperitoneal fluid or air. Musculoskeletal: No acute fracture or destructive osseous lesion in the pelvis. See separate report for findings in the lumbar spine. IMPRESSION: CHEST: 1. Large heterogenous mass in the left upper lobe/suprahilar region consistent with malignancy. 2. Diffuse bronchial wall thickening in the lingula and left lower lobe. Differential considerations include lymphangitic spread of tumor or airway infection/inflammation. 3. Multiple pulmonary nodules in the right lung consistent with metastases. 4. Mediastinal, bilateral hilar, and left axillary lymphadenopathy consistent with metastases. 5. Small left pleural effusion. ABDOMEN/PELVIS: 1. Bilateral pyelonephritis, left-greater-than-right. 2. The stent in the right superficial femoral artery is not definitively opacified and may be occluded though this is  incompletely evaluated on non arterial phase exam. 3. 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 Aortic Atherosclerosis (ICD10-I70.0) and Emphysema (ICD10-J43.9). Electronically Signed   By: Minerva Fester M.D.   On: 07/28/2023 18:16   CT Head Wo Contrast Result Date: 07/28/2023 CLINICAL DATA:  Delirium EXAM: CT HEAD WITHOUT CONTRAST TECHNIQUE: Contiguous axial images were obtained from the base of the skull through the vertex without intravenous contrast. RADIATION DOSE REDUCTION: This exam was performed according to the departmental dose-optimization program which includes automated exposure control, adjustment of the mA and/or kV according to patient size and/or use of iterative reconstruction technique. COMPARISON:  CT of the head December 07, 2014. FINDINGS: Brain: Complex cystic and solid 3.4 cm mass in the left parietal lobe. This lesion has some mild hyperdensity associated with it which could represent mineralization and or hemorrhage. Surrounding edema and mild regional mass effect without midline shift. No evidence of acute large vascular territory infarct, mass occupying acute hemorrhage outside  the above lesion, midline shift or hydrocephalus. Vascular: No hyperdense vessel identified. Skull: Normal. Negative for fracture or focal lesion. Sinuses/Orbits: Mostly clear sinuses.  No acute orbital findings. IMPRESSION: 1. Cystic and solid 3.4 cm mass in the left parietal lobe, which is concerning for a metastasis given the findings on the forthcoming CT of the chest/abdomen/pelvis. Recommend MRI of the head with contrast to further characterize and to assess for other lesions. 2. Surrounding edema and mild regional mass effect without midline shift. Electronically Signed   By: Feliberto Harts M.D.   On: 07/28/2023 18:02     The total time spent in the appointment was 55 minutes encounter with patients including  review of chart and various tests results, discussions about plan of care and coordination of care plan   All questions were answered. The patient knows to call the clinic with any problems, questions or concerns. No barriers to learning was detected.  Dawson Bills, NP 12/16/202411:56 AM  Mr. Watring was interviewed and examined.  I reviewed the admission CT and MRI images.  He was admitted yesterday with altered mental status and back pain.  He appears to have metastatic lung cancer with a dominant left lung mass, right lung, bone, lymph node, and bone metastases..  The altered mental status is likely secondary to hypercalcemia.  The differential diagnosis includes non-small cell and small cell lung cancer.  I favor non-small cell carcinoma given the hypercalcemia and isolated brain metastasis.  He appears to be a candidate for an ultrasound or CT-guided biopsy of the chest wall mass or left axillary lymph node.  He was alert and mildly confused when we saw him this afternoon.  I recommend continue intravenous hydration and adding Zometa.    He has right lung pulmonary emboli.  Recommendations: Consult interventional radiology for biopsy of the left lung mass or left axillary adenopathy (the left lung mass appears to be invading the chest wall) Continue intravenous hydration, add Zometa Consult radiation oncology to consider palliative radiation to the brain metastasis Continue Decadron Management atrial fibrillation per the medical service Continue heparin, can convert to a DOAC after hospital procedures are performed Treatment for the metastatic lung cancer will be recommended once a diagnosis is confirmed Narcotic analgesics as needed for pain, I will add morphine for pain not relieved with hydrocodone Outpatient follow-up will be scheduled at the Cancer center

## 2023-07-29 NOTE — H&P (View-Only) (Signed)
NAME:  Cody Pena, MRN:  [id number], DOB:  13-Jun-1961, LOS: 1 ADMISSION DATE:  07/28/2023, CONSULTATION DATE:  12/16 REFERRING MD:  Thedore Mins, CHIEF COMPLAINT:  Lung mass, PE, Mets to brain   History of Present Illness:   62 y.o. 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 CBG was 352. Patient was found by EMS to be in new onset A-fib with rates as high as 170. Received 20 mg of Cardizem and 750 of normal saline prior to arrival. He arrived in rapid A-fib. He has no known history of a-fib.   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  Left upper lobe bronchus is occluded by the mass. Multiple pulmonary nodules in the right lung consistent with metastasis. Mediastinal, bilateral hilar, left axillary lymphadenopathy consistent with metastasis .There is also a small left and minimal right pleural effusion.   CT abdomen and pelvis demonstrated bilateral pyelonephritis, left greater than right.  Stent in the right superior femoral artery may be occluded.  CTA demonstrated small subsegmental embolic burden in the right lower lobe with no evidence of right heart strain. Most likely  secondary to suspected malignancy. Pt. Also has new onset A fib with CHA2D2VASc of 3 that typically would need anticoagulation as well. However due to brain mass ,the risk of hemorrhage from brain mass likely is higher and outweighs  the benefit of treating a single subsegmental PE. Plan is to consider IVC filter after resolution of  infection (Pyelonephritis) .    In the ED, patient was noted to be afebrile but in atrial fibrillation with RVR with heart rates up to 170s, BP of 152/89 , CBC with leukocytosis of 18K, thrombocytosis of 515, no anemia.  Lactate was elevated 3.7.  CMP with sodium 132, CBG of 228, normal creatinine of 1, hypercalcemia of 12.3. Troponin was elevated but flat at 21-24.  Pt was started on  IV diltiazem infusion, and administered IV vancomycin and cefepime.   PCCM have been asked to assist for consideration of bronchoscopy for tissue sampling in setting of lung mass in a smoker , and PE.  Pertinent  Medical History   Past Medical History:  Diagnosis Date   Arthritis    COVID    has had Covid 3 times, not sick with it   COVID    has had 3 times mild to no symptoms   Depression    Diabetes mellitus without complication (HCC)    pt states he has now been told that he is a type 1 diabetic.   Dyspnea    albuterol inhaler   HLD (hyperlipidemia)    Hypertension    Peripheral vascular disease (HCC)      Significant Hospital Events: Including procedures, antibiotic start and stop dates in addition to other pertinent events   07/28/2023 Admission after fall 4-6 week history of dizziness and worsening shortness of breath  CTA Chest 07/29/2023 Small subsegmental embolic burden in the right lower lobe, with no evidence of right heart strain. 2. Large 12.7 x 9 x 14.6 cm left upper lobe mass with bronchovascular encasement, occluding the left upper lobe bronchus and almost completely occluding the left upper lobe pulmonary artery, and occluding the left superior pulmonary vein. 3. Multiple right lung nodules consistent with metastases. 4. Bilateral bronchial thickening but proportionately greater in the left lower lobe where there is peribronchovascular interstitial and airspace opacities  which could indicate changes of bronchopneumonia or lymphangitic carcinomatosis. 5. Small left and minimal right pleural effusions. 6. Multifocal mediastinal, right hilar and left axillary adenopathy. 7. Aortic and coronary artery atherosclerosis. 8. Emphysema.  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 Mild surrounding vasogenic edema without significant regional mass effect or midline shift. 2. 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 3. Underlying moderately advanced chronic microvascular ischemic disease.  MR Lumbar Spine 07/28/2023 Abnormal marrow replacement with heterogeneous enhancement within the L1 vertebral body, concerning for osseous metastatic disease. Associated mild pathologic compression fracture measures up to 20% without bony retropulsion. No extra osseous or epidural extension of tumor. 2. No other evidence for metastatic disease within the lumbar spine.  Interim History / Subjective:  Pt. States he feels weak and anxious. He gets short of breath while talking, and states he has felt dizzy for about the last 4 weeks. He has had nausea and has not been able to eat much for about 1 month. He is processing the news of his suspected lung cancer with mets States he quit smoking 1 month ago because he could not stand the smell.   Objective   Blood pressure 110/75, pulse (!) 52, temperature 98.3 F (36.8 C), temperature source Oral, resp. rate 13, height 6\' 3"  (1.905 m), weight 77.7 kg, SpO2 98%.        Intake/Output Summary (Last 24 hours) at 07/29/2023 0749 Last data filed at 07/29/2023 0600 Gross per 24 hour  Intake 656.85 ml  Output 1000 ml  Net -343.15 ml   Filed Weights   07/28/23 1518 07/29/23 0200  Weight: 97.1 kg 77.7 kg    Examination: General:  Pale, Alert male, in NAD, supine in  bed. HENT:  NCAT, PERRLA, MM pink and dry, No LAD Lungs:  Bilateral chest excursion, Clear but diminished per bases and decreased left upper chest , dry non-productive cough Cardiovascular: S1, S2, Irr rhythm, a fib per tele Abdomen:  Soft , NT, ND, BS +, Body mass index is 21.41 kg/m. Extremities: Left BKA, MAE x 4, muscle wasting Neuro:  Awake and alert x 3. Follows commands , appropriate GU:  Urinal  Resolved Hospital Problem list     Assessment & Plan:  Large  left upper lobe mass with broncho vascular encasement, occluding the left upper lobe bronchus and almost completely occluding the left upper lobe pulmonary artery, and occluding the left superior pulmonary vein. 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 Oncology referral per Primary Team Will most likely need radiation oncology in near future  to treat left upper lobe  bronchus  Consider evaluation of  small left pleural effusion for  possibility of thoracentesis and fluid to evaluate for cytology.  Titrate oxygen to maintain sats of > 90%  Small sub segmental PE in the right lower lobe, No evidence of heart strain Risk of hemorrhage from brain mass , so unable to anticoagulate . Plan Obtain Echo of the heart Consider IVC filter after resolution of  infection (Pyelonephritis) .   I spoke with the patient about our suspicion of lung cancer that has spread to the brain and spine. He had stated that he did not want his family to know this was a suspected diagnosis. I asked him why he did not want them to know. He stated he needs to tell his daughter first, as she is very strong and can help him break the news to his family. His wife has cervical cancer and she has anxiety and he  is concerned how she will handle the diagnosis. I explained he will need his family to help make decisions moving forward, and to provide him with support as he navigates the treatment of his suspected disease.  I told him they will have questions when he is taken for biopsy. His plan is to tell his daughter when he has a chance. He understands the need for biopsy. We discussed the risks of bleeding, infection, pneumothorax, and adverse reaction to anesthesia. He is concerned about painful procedures, and was reassured when I explained that the biopsy is done under general anesthesia.   Bevelyn Ngo, MSN, AGACNP-BC [facility] See Amion for personal pager PCCM on call pager [phone]    Best Practice (right click and "Reselect all SmartList Selections" daily)   Best practice per Primary team.  Labs   CBC: Recent Labs  Lab 07/28/23 1548 07/29/23 0422  WBC 18.6* 18.7*  NEUTROABS 15.4*  --   HGB 13.0 11.3*  HCT 40.9 35.7*  MCV 83.1 83.2  PLT 515* 391    Basic Metabolic Panel: Recent Labs  Lab 07/28/23 1548 07/29/23 0422  NA 132* 130*  K 4.5 4.5  CL 95* 97*  CO2 24 24  GLUCOSE 228* 189*  BUN 32* 30*  CREATININE 1.01 1.19  CALCIUM 12.3* 12.3*   GFR: Estimated Creatinine Clearance: 68.9 mL/min (by C-G formula based on SCr of 1.19 mg/dL). Recent Labs  Lab 07/28/23 1548 07/28/23 1553 07/28/23 1801 07/28/23 2304 07/29/23 0422  WBC 18.6*  --   --   --  18.7*  LATICACIDVEN  --  3.7* 2.0* 1.6  --     Liver Function Tests: Recent Labs  Lab 07/28/23 1548 07/29/23 0422  AST 21 15  ALT 13 11  ALKPHOS 95 85  BILITOT 0.9 0.6  PROT 7.3 6.5  ALBUMIN 2.2* 2.0*   No results for input(s): "LIPASE", "AMYLASE" in the last 168 hours. No results for input(s): "AMMONIA" in the last 168 hours.  ABG    Component Value Date/Time   PHART 7.421 12/07/2014 0350   PCO2ART 42.4 12/07/2014 0350   PO2ART 55.0 (L) 12/07/2014 0350   HCO3 27.5 (H)  12/07/2014 0350   TCO2 35 (H) 08/11/2021 0549   O2SAT 89.0 12/07/2014 0350     Coagulation Profile: No results for input(s): "INR", "PROTIME" in the last 168 hours.  Cardiac Enzymes: No results for input(s): "CKTOTAL", "CKMB", "CKMBINDEX", "TROPONINI" in the last 168 hours.  HbA1C: Hgb A1c MFr Bld  Date/Time Value Ref Range Status  07/28/2023 11:04 PM 9.9 (H) 4.8 - 5.6 % Final    Comment:    (  NOTE) Pre diabetes:          5.7%-6.4%  Diabetes:              >6.4%  Glycemic control for   <7.0% adults with diabetes   01/06/2023 04:53 AM 10.8 (H) 4.8 - 5.6 % Final    Comment:    (NOTE)         Prediabetes: 5.7 - 6.4         Diabetes: >6.4         Glycemic control for adults with diabetes: <7.0     CBG: Recent Labs  Lab 07/28/23 2311  GLUCAP 314*    Review of Systems:   Dizziness, Shortness of breath, weakness   Past Medical History:  He,  has a past medical history of Arthritis, COVID, COVID, Depression, Diabetes mellitus without complication (HCC), Dyspnea, HLD (hyperlipidemia), Hypertension, and Peripheral vascular disease (HCC).   Surgical History:   Past Surgical History:  Procedure Laterality Date   ABDOMINAL AORTOGRAM W/LOWER EXTREMITY N/A 08/11/2021   Procedure: ABDOMINAL AORTOGRAM W/LOWER EXTREMITY;  Surgeon: Leonie Douglas, MD;  Location: [facility];  Service: Cardiovascular;  Laterality: N/A;   AMPUTATION Left 11/15/2017   Procedure: LEFT FOOT 2ND RAY AMPUTATION;  Surgeon: Nadara Mustard, MD;  Location: [facility] OR;  Service: Orthopedics;  Laterality: Left;   AMPUTATION Right 08/25/2021   Procedure: RIGHT TRANSMETATARSAL AMPUTATION;  Surgeon: Nadara Mustard, MD;  Location[facility] OR;  Service: Orthopedics;  Laterality: Right;   AMPUTATION Right 09/27/2021   Procedure: BELOW KNEE AMPUTATION;  Surgeon: Nadara Mustard, MD;  Location: [facility] OR;  Service: Orthopedics;  Laterality: Right;   APPLICATION OF WOUND VAC  12/27/2021   Procedure: APPLICATION OF WOUND VAC;   Surgeon: Nadara Mustard, MD;  Location: [facility] OR;  Service: Orthopedics;;   CERVICAL FUSION     I & D EXTREMITY Left 12/27/2021   Procedure: LEFT LEG DEBRIDEMENT ULCERS;  Surgeon: Nadara Mustard, MD;  Location: [facility] OR;  Service: Orthopedics;  Laterality: Left;   I & D EXTREMITY Left 01/31/2022   Procedure: LEFT KNEE DEBRIDEMENT AND APPLY TISSUE GRAFT;  Surgeon: Nadara Mustard, MD;  Location: [facility] OR;  Service: Orthopedics;  Laterality: Left;   IR ANGIOGRAM EXTREMITY LEFT  11/14/2017   IR ANGIOGRAM SELECTIVE EACH ADDITIONAL VESSEL  11/14/2017   IR ANGIOGRAM SELECTIVE EACH ADDITIONAL VESSEL  11/14/2017   IR TIB-PERO ART PTA MOD SED  11/14/2017   IR TIB-PERO ART UNI PTA EA ADD VESSEL MOD SED  11/14/2017   IR US GUIDE VASC ACCESS LEFT  11/14/2017   PERIPHERAL VASCULAR INTERVENTION Right 08/11/2021   Procedure: PERIPHERAL VASCULAR INTERVENTION;  Surgeon: Leonie Douglas, MD;  Location: [facility];  Service: Cardiovascular;  Laterality: Right;  rt sfa stent   STUMP REVISION Right 01/09/2023   Procedure: REVISION RIGHT BELOW KNEE AMPUTATION;  Surgeon: Nadara Mustard, MD;  Location: [facility] OR;  Service: Orthopedics;  Laterality: Right;     Social History:   reports that he has been smoking cigarettes. He has a 23.5 pack-year smoking history. He has never used smokeless tobacco. He reports that he does not drink alcohol and does not use drugs.   Family History:  His family history is not on file.   Allergies Allergies  Allergen Reactions   Ceftriaxone Itching, Nausea And Vomiting and Rash    Noted minutes after starting medication No rash noted with Ancef on 01-31-2022 during general anesthesia Has received  cefepime and cefazolin without reaction     Home Medications  Prior to Admission medications   Medication Sig Start Date End Date Taking? Authorizing Provider  atorvastatin (LIPITOR) 10 MG tablet Take 10 mg by mouth daily. 06/05/23  Yes [provider]  pioglitazone (ACTOS) 15 MG tablet Take  15 mg by mouth daily. 04/24/23  Yes [provider]  acetaminophen (TYLENOL) 500 MG tablet Take 2 tablets (1,000 mg total) by mouth every 6 (six) hours as needed for mild pain or fever (For the first 3 days, may take 2 tabs (1000 mg total) 4 times daily scheduled following which can take 2 tabs (1000 mg total) 4 times daily as needed.). 01/12/23   Hongalgi, Maximino Greenland, MD  ascorbic acid (VITAMIN C) 1000 MG tablet Take 1 tablet (1,000 mg total) by mouth daily. 01/13/23   Hongalgi, Maximino Greenland, MD  docusate sodium (COLACE) 100 MG capsule Take 1 capsule (100 mg total) by mouth daily. 01/13/23   Hongalgi, Maximino Greenland, MD  gabapentin (NEURONTIN) 600 MG tablet Take 1 tablet (600 mg total) by mouth 3 (three) times daily. 10/05/21   Angiulli, Mcarthur Rossetti, PA-C  insulin aspart protamine - aspart (NOVOLOG 70/30 MIX) (70-30) 100 UNIT/ML FlexPen Inject 10 Units into the skin 2 (two) times daily with a meal. 01/12/23   Hongalgi, Maximino Greenland, MD  Insulin Pen Needle 32G X 4 MM MISC Use as directed two times daily. 01/12/23   Hongalgi, Maximino Greenland, MD  methocarbamol (ROBAXIN) 500 MG tablet Take 1 tablet (500 mg total) by mouth every 6 (six) hours as needed for muscle spasms. 01/12/23   Hongalgi, Maximino Greenland, MD  Multiple Vitamin (MULTIVITAMIN WITH MINERALS) TABS tablet Take 1 tablet by mouth daily. 01/13/23   Hongalgi, Maximino Greenland, MD  nicotine (NICODERM CQ - DOSED IN MG/24 HOURS) 14 mg/24hr patch Place 1 patch (14 mg total) onto the skin daily. 01/13/23   Hongalgi, Maximino Greenland, MD  oxycodone (OXY-IR) 5 MG capsule Take 1 capsule (5 mg total) by mouth every 6 (six) hours as needed. 02/22/23   Adonis Huguenin, NP  Oxycodone HCl 10 MG TABS Take 0.5 tablets (5 mg total) by mouth every 4 (four) hours as needed. 02/08/23   Adonis Huguenin, NP  polyethylene glycol powder (GLYCOLAX/MIRALAX) 17 GM/SCOOP powder Take 17 g by mouth daily as needed for mild constipation. 01/12/23   Hongalgi, Maximino Greenland, MD     Pulmonary APP Time : 50 minutes

## 2023-07-29 NOTE — Consult Note (Addendum)
Cardiology Consultation   Patient ID: Cody Pena MRN: [id number]; DOB: 1961/04/21  Admit date: 07/28/2023 Date of Consult: 07/29/2023  PCP:  [facility] Providers Cardiologist:  None        Patient Profile:   Cody Pena is a 62 y.o. 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 Thedore Mins.  History of Present Illness:   Cody Pena has no known cardiac history. He was admitted yesterday with altered mental status and back pain. He had also recently had a fall a few weeks ago. On admit he  was found to be in AF RVR with HR up to the 170s, BP 150s/80s. He received 20mg  IV diltiazem and 750cc saline bolus en route  Other signficant lab  findings included leukocytosis, thrombocytosis, lactic acidosis, hypercalcemia, low/flat troponins 21-24.  The pt denies CP   Breathing is fair CT imaging demonstrated small subsegmental PE in RLL, large left suprahilar lung mass, left bronchus occluded by the mass, multiple pulmonary nodules in the right lung and mediastinal, bilateral hilar, left axillary lymphadenopathy consistent with metastasis, possible spinal mets, as well as 3.4cm left parietal lobe with some hyperdensity (mineralization vs hemorrhage) with surrounding edema and mild regional mass effect without midline shift. Imaging also noted bronchial thickening, small L and minimal R pleural effusion, bilateral hydronephrosis L>R as well as possible occluded R SFA stent, aortic atherosclerosis, coronary atherosclerosis and emphysema.  F/u MRI brain showed punctate acute ischemic nonhemorrhagic infarct at the right parietal cortex. He was started on IV antibiotics and IV diltiazem.  He has been seen by neurology, IM, and oncology. Notes indicate IM  discussed clearance to start heparin with neurology. He remained on a diltiazem drip this morning at 15mg /hr, was started on diltiazem 90mg  q8hr, got 1 of 2 planned doses of IV digoxin, 1 dose of 5mg  IV metoprolol, and additional IV fluids. HR ~low 100s this afternoon, presently 101. He is requiring supplemental O2.  Echo pending. Primary team also has him on ASA.    Past Medical History:  Diagnosis Date   Arthritis    COVID    has had Covid 3 times, not sick with it   COVID    has had 3 times mild to no symptoms   Depression    Diabetes mellitus without complication (HCC)    pt states he has now been told that he is a type 1 diabetic.   Dyspnea    albuterol inhaler   HLD (hyperlipidemia)    Hypertension    Peripheral vascular disease (HCC)     Past Surgical History:  Procedure Laterality Date   ABDOMINAL AORTOGRAM W/LOWER EXTREMITY N/A 08/11/2021   Procedure: ABDOMINAL AORTOGRAM W/LOWER EXTREMITY;  Surgeon: Leonie Douglas, MD;  Location: [facility];  Service: Cardiovascular;  Laterality: N/A;   AMPUTATION Left 11/15/2017   Procedure: LEFT FOOT 2ND RAY AMPUTATION;  Surgeon: Nadara Mustard, MD;  Location: [facility] OR;  Service: Orthopedics;  Laterality: Left;   AMPUTATION Right 08/25/2021   Procedure: RIGHT TRANSMETATARSAL AMPUTATION;  Surgeon: Nadara Mustard, MD;  Location: [facility] OR;  Service: Orthopedics;  Laterality: Right;   AMPUTATION Right 09/27/2021   Procedure: BELOW KNEE AMPUTATION;  Surgeon: Nadara Mustard, MD;  Location: [facility]  OR;  Service: Orthopedics;  Laterality: Right;   APPLICATION OF WOUND VAC  12/27/2021   Procedure: APPLICATION OF WOUND VAC;  Surgeon: Nadara Mustard, MD;  Location: [facility] OR;  Service: Orthopedics;;   CERVICAL FUSION     I & D EXTREMITY Left 12/27/2021   Procedure: LEFT LEG DEBRIDEMENT ULCERS;  Surgeon: Nadara Mustard, MD;  Location: [facility] OR;  Service: Orthopedics;  Laterality: Left;   I & D EXTREMITY Left 01/31/2022   Procedure: LEFT KNEE DEBRIDEMENT AND  APPLY TISSUE GRAFT;  Surgeon: Nadara Mustard, MD;  Location: [facility] OR;  Service: Orthopedics;  Laterality: Left;   IR ANGIOGRAM EXTREMITY LEFT  11/14/2017   IR ANGIOGRAM SELECTIVE EACH ADDITIONAL VESSEL  11/14/2017   IR ANGIOGRAM SELECTIVE EACH ADDITIONAL VESSEL  11/14/2017   IR TIB-PERO ART PTA MOD SED  11/14/2017   IR TIB-PERO ART UNI PTA EA ADD VESSEL MOD SED  11/14/2017   IR US GUIDE VASC ACCESS LEFT  11/14/2017   PERIPHERAL VASCULAR INTERVENTION Right 08/11/2021   Procedure: PERIPHERAL VASCULAR INTERVENTION;  Surgeon: Leonie Douglas, MD;  Location: [facility];  Service: Cardiovascular;  Laterality: Right;  rt sfa stent   STUMP REVISION Right 01/09/2023   Procedure: REVISION RIGHT BELOW KNEE AMPUTATION;  Surgeon: Nadara Mustard, MD;  Location: [facility] OR;  Service: Orthopedics;  Laterality: Right;     Home Medications:  Prior to Admission medications   Medication Sig Start Date End Date Taking? Authorizing Provider  acetaminophen (TYLENOL) 500 MG tablet Take 2 tablets (1,000 mg total) by mouth every 6 (six) hours as needed for mild pain or fever (For the first 3 days, may take 2 tabs (1000 mg total) 4 times daily scheduled following which can take 2 tabs (1000 mg total) 4 times daily as needed.). 01/12/23  Yes Hongalgi, Maximino Greenland, MD  atorvastatin (LIPITOR) 10 MG tablet Take 10 mg by mouth daily. 06/05/23  Yes [provider]  gabapentin (NEURONTIN) 600 MG tablet Take 1 tablet (600 mg total) by mouth 3 (three) times daily. 10/05/21  Yes Angiulli, Mcarthur Rossetti, PA-C  pioglitazone (ACTOS) 15 MG tablet Take 15 mg by mouth daily. 04/24/23  Yes [provider]  insulin aspart protamine - aspart (NOVOLOG 70/30 MIX) (70-30) 100 UNIT/ML FlexPen Inject 10 Units into the skin 2 (two) times daily with a meal. Patient not taking: Reported on 07/29/2023 01/12/23   Elease Etienne, MD  Insulin Pen Needle 32G X 4 MM MISC Use as directed two times daily. 01/12/23   Hongalgi, Maximino Greenland, MD  methocarbamol  (ROBAXIN) 500 MG tablet Take 1 tablet (500 mg total) by mouth every 6 (six) hours as needed for muscle spasms. Patient not taking: Reported on 07/29/2023 01/12/23   Elease Etienne, MD  Multiple Vitamin (MULTIVITAMIN WITH MINERALS) TABS tablet Take 1 tablet by mouth daily. Patient not taking: Reported on 07/29/2023 01/13/23   Elease Etienne, MD  nicotine (NICODERM CQ - DOSED IN MG/24 HOURS) 14 mg/24hr patch Place 1 patch (14 mg total) onto the skin daily. Patient not taking: Reported on 07/29/2023 01/13/23   Elease Etienne, MD  oxycodone (OXY-IR) 5 MG capsule Take 1 capsule (5 mg total) by mouth every 6 (six) hours as needed. Patient not taking: Reported on 07/29/2023 02/22/23   Adonis Huguenin, NP  Oxycodone HCl 10 MG TABS Take 0.5 tablets (5 mg total) by mouth every 4 (four) hours as needed. Patient not taking: Reported on 07/29/2023 02/08/23  Adonis Huguenin, NP    Inpatient Medications: Scheduled Meds:  aspirin EC  81 mg Oral Daily   atorvastatin  10 mg Oral QPM   dexamethasone (DECADRON) injection  4 mg Intravenous QHS   digoxin  0.125 mg Intravenous Q6H   diltiazem  90 mg Oral Q8H   insulin aspart  0-20 Units Subcutaneous TID PC & HS   Continuous Infusions:  sodium chloride 100 mL/hr at 07/29/23 1306   ceFEPime (MAXIPIME) IV 2 g (07/29/23 1009)   diltiazem (CARDIZEM) infusion 15 mg/hr (07/29/23 1006)   heparin 1,200 Units/hr (07/29/23 1313)   PRN Meds: diltiazem, HYDROcodone-acetaminophen, metoprolol tartrate  Allergies:    Allergies  Allergen Reactions   Ceftriaxone Itching, Nausea And Vomiting and Rash    Noted minutes after starting medication No rash noted with Ancef on 01-31-2022 during general anesthesia Has received cefepime and cefazolin without reaction    Social History:   Social History   Socioeconomic History   Marital status: Married    Spouse name: Not on file   Number of children: Not on file   Years of education: Not on file   Highest education  level: Not on file  Occupational History   Not on file  Tobacco Use   Smoking status: Every Day    Current packs/day: 0.50    Average packs/day: 0.5 packs/day for 47.0 years (23.5 ttl pk-yrs)    Types: Cigarettes   Smokeless tobacco: Never  Vaping Use   Vaping status: Some Days  Substance and Sexual Activity   Alcohol use: No   Drug use: No   Sexual activity: Not on file  Other Topics Concern   Not on file  Social History Narrative   Not on file   Social Drivers of Health   Financial Resource Strain: Not on File (01/10/2023)   Received from [employer], [employer]    Financial Resource Strain: 0  Food Insecurity: No Food Insecurity (07/29/2023)   Hunger Vital Sign    Worried About Running Out of Food in the Last Year: Never true    Ran Out of Food in the Last Year: Never true  Transportation Needs: No Transportation Needs (07/29/2023)   PRAPARE - Administrator, Civil Service (Medical): No    Lack of Transportation (Non-Medical): No  Physical Activity: Not on File (01/10/2023)   Received from [place], [place]   Physical Activity    Physical Activity: 0  Stress: Not on File (01/10/2023)   Received from [facility], [place]   Stress    Stress: 0  Social Connections: Not on File (01/10/2023)   Received from [place], [place]   Social Connections    Social Connections and Isolation: 0  Intimate Partner Violence: Not At Risk (07/29/2023)   Humiliation, Afraid, Rape, and Kick questionnaire    Fear of Current or Ex-Partner: No    Emotionally Abused: No    Physically Abused: No    Sexually Abused: No    Family History:   Family History  Problem Relation Age of Onset   COPD Father      ROS:  Please see the history of present illness.  All other ROS reviewed and negative.     Physical Exam/Data:   Vitals:   07/29/23 1000 07/29/23 1100 07/29/23 1200 07/29/23 1300  BP:  124/81 110/79 (!) 140/77  Pulse: 99 95 88 98  Resp: (!) 22 (!) 22 (!) 21 (!) 28   Temp:  98.6 F (37  C)    TempSrc:  Oral    SpO2: 93% 93% 92% (!) 88%  Weight:      Height:        Intake/Output Summary (Last 24 hours) at 07/29/2023 1552 Last data filed at 07/29/2023 0600 Gross per 24 hour  Intake 656.85 ml  Output 1000 ml  Net -343.15 ml      07/29/2023    2:00 AM 07/28/2023    3:18 PM 01/06/2023    3:00 AM  Last 3 Weights  Weight (lbs) 171 lb 4.8 oz 214 lb 214 lb 15.2 oz  Weight (kg) 77.7 kg 97.07 kg 97.5 kg     Body mass index is 21.41 kg/m.  Exam per MD\\  HEENT;    NCAT    Neck  Difficult to assess JVP given beard Lungs   Decreased BS at based    Cardiac exam:  Irreg, irreg   No S3  No significant murmur  Tachy Abd is supple  No hepatomegaly  Ext   S/p R BKA    LLE   NO edema     Feet warm    EKG:  The EKG was personally reviewed and demonstrates:  atrial fibrillation with RVR 175bpm nonspecific STTW changes  Telemetry:  Telemetry was personally reviewed and demonstrates:  atrial fibrillation with variable rate  Now 100s      Relevant CV Studies: 2d echo pending  Laboratory Data:  High Sensitivity Troponin:   Recent Labs  Lab 07/28/23 1548 07/28/23 1756  TROPONINIHS 21* 24*     Chemistry Recent Labs  Lab 07/28/23 1548 07/29/23 0422 07/29/23 0942  NA 132* 130*  --   K 4.5 4.5  --   CL 95* 97*  --   CO2 24 24  --   GLUCOSE 228* 189*  --   BUN 32* 30*  --   CREATININE 1.01 1.19  --   CALCIUM 12.3* 12.3*  --   MG  --   --  1.9  GFRNONAA >60 >60  --   ANIONGAP 13 9  --     Recent Labs  Lab 07/28/23 1548 07/29/23 0422  PROT 7.3 6.5  ALBUMIN 2.2* 2.0*  AST 21 15  ALT 13 11  ALKPHOS 95 85  BILITOT 0.9 0.6   Lipids No results for input(s): "CHOL", "TRIG", "HDL", "LABVLDL", "LDLCALC", "CHOLHDL" in the last 168 hours.  Hematology Recent Labs  Lab 07/28/23 1548 07/29/23 0422  WBC 18.6* 18.7*  RBC 4.92 4.29  HGB 13.0 11.3*  HCT 40.9 35.7*  MCV 83.1 83.2  MCH 26.4 26.3  MCHC 31.8 31.7  RDW 14.3 14.4  PLT 515* 391    Thyroid  Recent Labs  Lab 07/29/23 0422  TSH 1.533    BNP Recent Labs  Lab 07/29/23 0942  BNP 250.4*    DDimer No results for input(s): "DDIMER" in the last 168 hours.   Radiology/Studies:  VAS [place] LOWER EXTREMITY VENOUS (DVT) Result Date: 07/29/2023  Lower Venous DVT Study Patient Name:  SAYVEON KEULER  Date of Exam:   07/29/2023 Medical Rec #: [id number]        Accession #:    [id number] Date of Birth: 1961/02/04       Patient Gender: M Patient Age:   42 years Exam Location:  [facility] Procedure:      VAS [place] LOWER EXTREMITY VENOUS (DVT) Referring Phys: CHING TU --------------------------------------------------------------------------------  Indications: Pulmonary embolism.  Risk Factors: Confirmed PE Surgery RT BKA.  Anticoagulation: Heparin. Comparison Study: No significant changes seen since prior exam 07/24/21 Performing Technologist: Shona Simpson  Examination Guidelines: A complete evaluation includes B-mode imaging, spectral Doppler, color Doppler, and power Doppler as needed of all accessible portions of each vessel. Bilateral testing is considered an integral part of a complete examination. Limited examinations for reoccurring indications may be performed as noted. The reflux portion of the exam is performed with the patient in reverse Trendelenburg.  +---------+---------------+---------+-----------+----------+--------------+ RIGHT    CompressibilityPhasicitySpontaneityPropertiesThrombus Aging +---------+---------------+---------+-----------+----------+--------------+ CFV      Full           Yes      Yes                                 +---------+---------------+---------+-----------+----------+--------------+ SFJ      Full                                                        +---------+---------------+---------+-----------+----------+--------------+ FV Prox  Full                                                         +---------+---------------+---------+-----------+----------+--------------+ FV Mid   Full                                                        +---------+---------------+---------+-----------+----------+--------------+ FV DistalFull                                                        +---------+---------------+---------+-----------+----------+--------------+ PFV      Full                                                        +---------+---------------+---------+-----------+----------+--------------+ POP      Full           Yes      Yes                                 +---------+---------------+---------+-----------+----------+--------------+ PTV      Full                                                        +---------+---------------+---------+-----------+----------+--------------+ PERO     Full                                                        +---------+---------------+---------+-----------+----------+--------------+   +---------+---------------+---------+-----------+----------+--------------+  LEFT     CompressibilityPhasicitySpontaneityPropertiesThrombus Aging +---------+---------------+---------+-----------+----------+--------------+ CFV      Full           Yes      Yes                                 +---------+---------------+---------+-----------+----------+--------------+ SFJ      Full                                                        +---------+---------------+---------+-----------+----------+--------------+ FV Prox  Full                                                        +---------+---------------+---------+-----------+----------+--------------+ FV Mid   Full                                                        +---------+---------------+---------+-----------+----------+--------------+ FV DistalFull                                                         +---------+---------------+---------+-----------+----------+--------------+ PFV      Full                                                        +---------+---------------+---------+-----------+----------+--------------+ POP      Full           Yes      Yes                                 +---------+---------------+---------+-----------+----------+--------------+ PTV      Full                                                        +---------+---------------+---------+-----------+----------+--------------+ PERO     Full                                                        +---------+---------------+---------+-----------+----------+--------------+    Summary: BILATERAL: - No evidence of deep vein thrombosis seen in the lower extremities, bilaterally. -No evidence of popliteal cyst, bilaterally.   *See table(s) above for measurements and observations.    Preliminary    EEG adult Result Date: 07/29/2023 Lindie Spruce  Val Eagle, MD     07/29/2023 11:52 AM Patient Name: EPHRAIM LAURSEN MRN: [id number] Epilepsy Attending: Charlsie Quest Referring Physician/Provider: Caryl Pina, MD Date: 07/29/2023 Duration: 25.56 mins Patient history: 62 yo M presented to the ED yesterday afternoon with a one week history of AMS and back pain. EEG to evaluate for seizure Level of alertness: Awake AEDs during EEG study: None Technical aspects: This EEG study was done with scalp electrodes positioned according to the 10-20 International system of electrode placement. Electrical activity was reviewed with band pass filter of 1-70Hz , sensitivity of 7 uV/mm, display speed of 60mm/sec with a 60Hz  notched filter applied as appropriate. EEG data were recorded continuously and digitally stored.  Video monitoring was available and reviewed as appropriate. Description: No posterior dominant rhythm was seen. EEG showed continuous generalized 3 to 6 Hz theta-delta slowing admixed with 12-14 Hz beta activity distributed  symmetrically and diffusely.  Physiologic photic driving was not seen during photic stimulation.  Hyperventilation was not performed.   ABNORMALITY - Continuous slow, generalized IMPRESSION: This study is suggestive of moderate diffuse encephalopathy. No seizures or epileptiform discharges were seen throughout the recording. Charlsie Quest    CT Angio Chest Pulmonary Embolism (PE) W or WO Contrast Result Date: 07/29/2023 CLINICAL DATA:  Pulmonary embolism suspected. Positive D-dimer. CT yesterday demonstrating a large left upper lobe neoplasm with mediastinal and hilar adenopathy and bronchovascular encasement. EXAM: CT ANGIOGRAPHY CHEST WITH CONTRAST TECHNIQUE: Multidetector CT imaging of the chest was performed using the standard protocol during bolus administration of intravenous contrast. Multiplanar CT image reconstructions and MIPs were obtained to evaluate the vascular anatomy. RADIATION DOSE REDUCTION: This exam was performed according to the departmental dose-optimization program which includes automated exposure control, adjustment of the mA and/or kV according to patient size and/or use of iterative reconstruction technique. CONTRAST:  75mL OMNIPAQUE IOHEXOL 350 MG/ML SOLN COMPARISON:  CT chest, abdomen and pelvis with IV contrast yesterday at 5:28 p.m. (current exam 07/29/2023 at 12:08 a.m.). FINDINGS: Cardiovascular: There is a small embolic burden in the right lower lobe with nonocclusive thrombus within a posterior basal subsegmental branch likely extending into 2 of its downstream small branches. There is no evidence of right heart strain and no further embolus is seen. Bronchovascular tumor encasement narrows the distal left main pulmonary artery and it appears to almost completely occlude the left upper lobe pulmonary artery, and occludes the left superior pulmonary vein. Pulmonary trunk is upper limits of normal in caliber. Cardiac size is normal. There is a small pericardial effusion. There  is no venous dilatation. Aortic and three-vessel coronary artery atherosclerosis is present but no aneurysm, stenosis or dissection, with scattered plaque in the great vessels. Mediastinum/Nodes: Large left upper lobe mass encases and obscures the hilar structures for the most part. There is undoubtedly left hilar adenopathy but it is difficult to separate from the mass. Multifocal mediastinal adenopathy is seen with subcarinal nodal mass 4.3 x 2.6 cm on 5:74, right paratracheal adenopathy with multiple lymph nodes enlarged for example measuring 2 x 1.9 cm on 5:48, enlarged right hilar nodes measuring as much as 2.4 x 3.1 cm on 5:71, and left axillary adenopathy with the largest of these lymph nodes measuring 2.3 x 2.0 cm on 5:53. The trachea and main bronchi are patent. Unremarkable thoracic esophagus. Lungs/Pleura: Large heterogeneous left upper lobe mass obstructs the left upper lobe bronchus, with no visible remaining aeration in the left upper lobe. Scattered foci of air in the apical portion of  the mass could indicate tumoral necrosis or residual air bronchograms versus infectious complication. In total the mass measures 12.7 cm AP on 5:53, 9 cm transverse on 5:44, and 14.6 cm in height. Small layering left and minimal layering right pleural effusions are also again noted as well as multiple right lung nodules consistent with metastases. There is bilateral bronchial thickening but it is proportionately greater in the left lower lobe where there is peribronchovascular interstitial and airspace opacities which could indicate changes of bronchopneumonia or lymphangitic carcinomatosis. Multiple micronodules are noted in this distribution. Examples of some of the larger right lung metastases include a 1.1 cm nodule in the right upper lobe on 6:46, a right lower lobe superior segment nodule measuring 1.4 cm on 6:51, and a 1.3 cm right lower lobe nodule on 6:119. Others are smaller. There are mild centrilobular and  paraseptal emphysematous changes in the right upper lobe. Upper Abdomen: No upper abdominal metastasis is seen. Musculoskeletal: Thoracic spondylosis. Partially visible lower cervical ACDF plating. Multilevel thoracic degenerative disc disease. No destructive bone lesions. Review of the MIP images confirms the above findings. IMPRESSION: 1. Small subsegmental embolic burden in the right lower lobe, with no evidence of right heart strain. 2. Large 12.7 x 9 x 14.6 cm left upper lobe mass with bronchovascular encasement, occluding the left upper lobe bronchus and almost completely occluding the left upper lobe pulmonary artery, and occluding the left superior pulmonary vein. 3. Multiple right lung nodules consistent with metastases. 4. Bilateral bronchial thickening but proportionately greater in the left lower lobe where there is peribronchovascular interstitial and airspace opacities which could indicate changes of bronchopneumonia or lymphangitic carcinomatosis. 5. Small left and minimal right pleural effusions. 6. Multifocal mediastinal, right hilar and left axillary adenopathy. 7. Aortic and coronary artery atherosclerosis. 8. Emphysema. 9. These results will be called to the ordering clinician or representative by the Radiologist Assistant, and communication documented in the PACS or [employer]. Aortic Atherosclerosis (ICD10-I70.0) and Emphysema (ICD10-J43.9). Electronically Signed   By: Almira Bar M.D.   On: 07/29/2023 00:42   MR Lumbar Spine W Wo Contrast Result Date: 07/28/2023 CLINICAL DATA:  Initial evaluation for acute low back pain EXAM: MRI LUMBAR SPINE WITHOUT AND WITH CONTRAST TECHNIQUE: Multiplanar and multiecho pulse sequences of the lumbar spine were obtained without and with intravenous contrast. CONTRAST:  10mL GADAVIST GADOBUTROL 1 MMOL/ML IV SOLN COMPARISON:  Prior CT from earlier the same day. FINDINGS: Segmentation: Standard. Lowest well-formed disc space labeled the L5-S1 level.  Alignment: 2 mm degenerative retrolisthesis of L1 on L2 and L2 on L3, with 3 mm anterolisthesis of L5 on S1. Vertebrae: Abnormal marrow replacement with edema and heterogeneous enhancement seen within the L1 vertebral body, concerning for osseous metastatic disease. Associated mild pathologic compression fracture measures up to 20% without bony retropulsion. No extra osseous or epidural extension of tumor. No other visible metastatic lesions within the lumbar spine. Bone marrow signal intensity otherwise normal. Prominent degenerative reactive endplate changes noted about the L2-3 interspace. No other abnormal marrow edema or enhancement. Conus medullaris and cauda equina: Conus extends to the T12-L1 no abnormal enhancement. Level. Conus and cauda equina appear normal. Paraspinal and other soft tissues: Changes of acute pyelonephritis involving the left greater than right kidneys noted, described on prior CT. 5.3 cm simple right renal cyst, benign in appearance, no follow-up imaging recommended. Disc levels: T12-L1: Mild diffuse disc bulge with reactive endplate spurring. Mild facet hypertrophy. No spinal stenosis. Foramina remain patent. L1-2: Degenerative intervertebral disc  space narrowing with diffuse disc bulge and disc desiccation. Reactive endplate spurring. Mild bilateral facet hypertrophy. Resultant mild narrowing of the lateral recesses bilaterally. Central canal remains patent. Foramina remain adequately patent bilaterally. L2-3: Advanced degenerative intervertebral disc space narrowing with diffuse disc bulge and disc desiccation. Prominent reactive endplate change with marginal endplate osteophytic spurring and marrow edema. Mild to moderate bilateral facet arthrosis. Resultant moderate canal with bilateral subarticular stenosis. Moderate left worse than right L2 foraminal narrowing. L3-4: Diffuse disc bulge with reactive endplate spurring. Superimposed small left foraminal disc protrusion contacts the  exiting left L3 nerve root (series 9, image 12). Mild-to-moderate bilateral facet hypertrophy. Resultant mild narrowing of the lateral recesses bilaterally. Central canal remains patent. Mild to moderate left L3 foraminal stenosis. Right neural foramen remains patent. L4-5: Disc desiccation with diffuse disc bulge. Reactive endplate spurring. Moderate to severe bilateral facet arthrosis with associated small reactive joint effusions. Resultant mild to moderate bilateral subarticular stenosis. Central canal remains patent. Moderate to severe bilateral L4 foraminal narrowing. L5-S1: Anterolisthesis with degenerative intervertebral disc space narrowing. Diffuse disc bulge with reactive endplate spurring. Moderate bilateral facet hypertrophy. No significant spinal stenosis. Moderate bilateral L5 foraminal narrowing. IMPRESSION: 1. Abnormal marrow replacement with heterogeneous enhancement within the L1 vertebral body, concerning for osseous metastatic disease. Associated mild pathologic compression fracture measures up to 20% without bony retropulsion. No extra osseous or epidural extension of tumor. 2. No other evidence for metastatic disease within the lumbar spine. 3. Multilevel lumbar spondylosis with resultant moderate canal stenosis at L2-3. Associated multilevel foraminal narrowing as above, moderate to severe in nature at L4-5 bilaterally. 4. Changes of acute pyelonephritis involving the left greater than right kidneys, described on prior CT. Electronically Signed   By: Rise Mu M.D.   On: 07/28/2023 23:59   MR BRAIN W WO CONTRAST Result Date: 07/28/2023 CLINICAL DATA:  Initial evaluation for brain/CNS neoplasm EXAM: MRI HEAD WITHOUT AND WITH CONTRAST TECHNIQUE: Multiplanar, multiecho pulse sequences of the brain and surrounding structures were obtained without and with intravenous contrast. CONTRAST:  10mL GADAVIST GADOBUTROL 1 MMOL/ML IV SOLN COMPARISON:  Prior CT from 07/28/2023. FINDINGS:  Brain: Cerebral volume within normal limits. Patchy T2/FLAIR hyperintensity involving the periventricular and deep white matter both supra hemispheres, consistent with chronic small vessel ischemic disease, moderately advanced in nature. Punctate acute ischemic nonhemorrhagic infarct present at the right parietal cortex (series 9, image 98). No other evidence for acute or subacute ischemia. Mixed cystic and solid peripherally enhancing mass positioned at the left occipital lobe is seen, measuring 4.0 x 3.5 x 3.7 cm. Mild surrounding vasogenic edema without significant regional mass effect or midline shift. 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 No other visible mass lesions or abnormal enhancement. No hydrocephalus or extra-axial fluid collection. Pituitary gland suprasellar region within normal limits. Vascular: Major intracranial vascular flow voids are maintained. Skull and upper cervical spine: Craniocervical junction within normal limits. Visualized bone marrow signal intensity within normal limits. Postoperative changes noted within the partially visualized upper cervical spine. Sinuses/Orbits: Globes and orbital soft tissues within normal limits. Paranasal sinuses are largely clear. No significant mastoid effusion. Other: None. IMPRESSION: 1. 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 Mild surrounding vasogenic edema without significant regional mass effect or midline shift. 2. 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 3. Underlying moderately advanced  chronic microvascular ischemic disease. Electronically Signed   By: Rise Mu M.D.   On: 07/28/2023 23:48   CT L-SPINE NO CHARGE Result Date: 07/28/2023 CLINICAL DATA:  Back pain after fall December 1st EXAM: CT LUMBAR SPINE WITHOUT  CONTRAST TECHNIQUE: Multidetector CT imaging of the lumbar spine was performed without intravenous contrast administration. Multiplanar CT image reconstructions were also generated. RADIATION DOSE REDUCTION: This exam was performed according to the departmental dose-optimization program which includes automated exposure control, adjustment of the mA and/or kV according to patient size and/or use of iterative reconstruction technique. COMPARISON:  CT 04/05/2016 FINDINGS: Segmentation: 5 lumbar type vertebrae. Alignment: No evidence of traumatic listhesis. Mild grade 1 retrolisthesis of L2 is likely chronic. Vertebrae: Mild superior endplate compression of L1, may be chronic though is age indeterminate without recent comparison. Otherwise no evidence of acute fracture. There is mild heterogeneity of the marrow of L1 and metastasis is not excluded. Consider MRI for further evaluation. Paraspinal and other soft tissues: See separate report for findings in the abdomen and pelvis. Disc levels: Multilevel spondylosis and disc space height loss with degenerative endplate changes greatest at L2-L3 where it is advanced. Mild multilevel facet arthropathy. Spinal canal narrowing is greatest at L2-L3 where it is moderate. IMPRESSION: 1. Mild superior endplate compression of L1 may be chronic though is age indeterminate without recent comparison. 2. Heterogeneity of the marrow of L1. This may be related to chronic changes however metastasis is not excluded. Consider MRI for further evaluation. \ 3. Advanced disc space height loss at L2-L3. 4. See separate report for findings in the chest, abdomen, and pelvis. Electronically Signed   By: Minerva Fester M.D.   On: 07/28/2023 18:50   CT CHEST ABDOMEN PELVIS W CONTRAST Result Date: 07/28/2023 CLINICAL DATA:  Altered mental status for 1 week and back pain after a fall on December 1st. EXAM: CT CHEST, ABDOMEN, AND PELVIS WITH CONTRAST TECHNIQUE: Multidetector CT imaging of the  chest, abdomen and pelvis was performed following the standard protocol during bolus administration of intravenous contrast. RADIATION DOSE REDUCTION: This exam was performed according to the departmental dose-optimization program which includes automated exposure control, adjustment of the mA and/or kV according to patient size and/or use of iterative reconstruction technique. CONTRAST:  75mL OMNIPAQUE IOHEXOL 350 MG/ML SOLN COMPARISON:  CT abdomen and pelvis 01/14/2008 and chest radiographs 12/07/2014 FINDINGS: CT CHEST FINDINGS Cardiovascular: Normal heart size. Small pericardial effusion. Pericardial thickening where it abuts the large left suprahilar mass. Coronary artery and aortic atherosclerotic calcification. The left upper lobe pulmonary arteries are not well visualized and may be compressed or occluded by the mass. Mediastinum/Nodes: Large heterogenous mass in the left suprahilar region extending into the left upper lobe. Mediastinal, bilateral hilar, and left axillary lymphadenopathy. For example a subcarinal node measures 2.5 cm (series 3/image 27); 1.3 cm right hilar node on series 3/image 34; and a left axillary node measures 2.2 cm (3/19). Trachea and esophagus are unremarkable. Lungs/Pleura: Large heterogenous mass left upper lobe/suprahilar mass consistent with malignancy measuring 12.1 x 8.7 by 12.1 cm. There are a few small locules of gas in the superior portion of the mass which may represent cavitation. The left upper lobe bronchus is occluded by the mass. Diffuse bronchial wall thickening in the lingula and left lower lobe with centrilobular micro nodules and tree-in-bud opacities. 1.6 cm subpleural nodule in the anterior lingula (series 4/image 90). Small left pleural effusion. Multiple pulmonary nodules in the right lung consistent with metastases. For example 12  mm nodule in the posterior right lower lobe (4/128) and 9 mm nodule in the right upper lobe (4/44). Mild emphysema in the right.  No right pleural effusion. No pneumothorax. Musculoskeletal: No acute fracture.  No destructive osseous lesion. CT ABDOMEN PELVIS FINDINGS Hepatobiliary: No focal hepatic lesion. Layering sludge in the gallbladder. No biliary dilation or evidence of cholecystitis. Pancreas: Unremarkable. Spleen: Unremarkable. Adrenals/Urinary Tract: Normal adrenal glands. Heterogenous attenuation of the left kidney. Focal geographic hypoattenuation in the posterior right kidney. Mild asymmetric left-greater-than-right perinephric stranding. Findings are compatible with pyelonephritis. No urinary calculi or hydronephrosis. Unremarkable bladder. Stomach/Bowel: Normal caliber large and small bowel without bowel wall thickening. Stomach and appendix are within normal limits. Colonic diverticulosis without diverticulitis. Vascular/Lymphatic: Aortic atherosclerosis. 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 The stent in the right superficial femoral artery is not definitively opacified and may be occluded. No enlarged abdominal or pelvic lymph nodes. Reproductive: No acute abnormality. Other: No free intraperitoneal fluid or air. Musculoskeletal: No acute fracture or destructive osseous lesion in the pelvis. See separate report for findings in the lumbar spine. IMPRESSION: CHEST: 1. Large heterogenous mass in the left upper lobe/suprahilar region consistent with malignancy. 2. Diffuse bronchial wall thickening in the lingula and left lower lobe. Differential considerations include lymphangitic spread of tumor or airway infection/inflammation. 3. Multiple pulmonary nodules in the right lung consistent with metastases. 4. Mediastinal, bilateral hilar, and left axillary lymphadenopathy consistent with metastases. 5. Small left pleural effusion. ABDOMEN/PELVIS: 1. Bilateral pyelonephritis, left-greater-than-right. 2. The stent in the right superficial  femoral artery is not definitively opacified and may be occluded though this is incompletely evaluated on non arterial phase exam. 3. 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 Aortic Atherosclerosis (ICD10-I70.0) and Emphysema (ICD10-J43.9). Electronically Signed   By: Minerva Fester M.D.   On: 07/28/2023 18:16   CT Head Wo Contrast Result Date: 07/28/2023 CLINICAL DATA:  Delirium EXAM: CT HEAD WITHOUT CONTRAST TECHNIQUE: Contiguous axial images were obtained from the base of the skull through the vertex without intravenous contrast. RADIATION DOSE REDUCTION: This exam was performed according to the departmental dose-optimization program which includes automated exposure control, adjustment of the mA and/or kV according to patient size and/or use of iterative reconstruction technique. COMPARISON:  CT of the head December 07, 2014. FINDINGS: Brain: Complex cystic and solid 3.4 cm mass in the left parietal lobe. This lesion has some mild hyperdensity associated with it which could represent mineralization and or hemorrhage. Surrounding edema and mild regional mass effect without midline shift. No evidence of acute large vascular territory infarct, mass occupying acute hemorrhage outside the above lesion, midline shift or hydrocephalus. Vascular: No hyperdense vessel identified. Skull: Normal. Negative for fracture or focal lesion. Sinuses/Orbits: Mostly clear sinuses.  No acute orbital findings. IMPRESSION: 1. Cystic and solid 3.4 cm mass in the left parietal lobe, which is concerning for a metastasis given the findings on the forthcoming CT of the chest/abdomen/pelvis. Recommend MRI of the head with contrast to further characterize and to assess for other lesions. 2. Surrounding edema and mild regional mass effect without midline shift. Electronically Signed   By: Feliberto Harts M.D.   On: 07/28/2023 18:02      Assessment and Plan:   1. 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 Newly diagnosed atrial fibrillation with RVR - almost certainly provoked by the numerous physiologic stressors outlined above - will defer ongoing discussions of anticoagulation to the primary team and neurology in setting of his brain mets; for now they feel patient's benefit outweighs risk of heparin - anticipate rate control strategy over rhythm restoration given the likelihood that he will need to interrupt anticoagulation going forward for treatment of the above - TSH WNL - further med plans TBD pending MD review - required both IV diltiazem drip (ongoing) + start of PO diltiazem + 1 of 2 doses of digoxin thus far + 5mg  IV metoprolol with HR control to low 100s - 2D echo pending  3. Minimal troponin elevation - suspected demand ischemia in the context of #1 - do not anticipate further inpatient ischemic workup, risk factor modification can be broached contingent on GOC   Remainder per IM  Risk Assessment/Risk Scores:     CHA2DS2-VASc Score = 5   This indicates a 7.2% annual risk of stroke. The patient's score is based upon: CHF History: 0 HTN History: 1 Diabetes History: 1 Stroke History: 2 Vascular Disease History: 1 Age Score: 0 Gender Score: 0     For questions or updates, please contact [facility] Please consult [email or web address] for contact info under    Signed, Laurann Montana, PA-C  Note prepped remotely with MD to follow 07/29/2023 3:52 PM  Patient seen and examined   I have amended note above by D DUnn to reflect my findings and to put in physical exam Atrial fibrillation   I would continue on IV diltiazem as he is on   I would  change metoprolol to IV every 6 hours   Can increase to every 4 hours    BP should tolerate it  Can start digoxin 0.25 mg daily    Can start today  Echo has been ordered   Troponin elevation  Trivial   Most likely demand ischemia in setting of afib with RVR   Minor  Hyponatremia   Would repeat BMET  in am     Dietrich Pates MD

## 2023-07-29 NOTE — Progress Notes (Signed)
Patient in yellow mews at start of shift

## 2023-07-29 NOTE — Progress Notes (Signed)
Pt was admitted for new onset afib,  AMS, imaging showing poly-nephritis. Currently on antibiotics. Persistent yellow MEWS discussed with Dr Antionette Char. Alarming for heart rate and WBC. No change to plan of care.

## 2023-07-29 NOTE — Procedures (Signed)
Patient Name: Cody Pena  MRN: [id number]  Epilepsy Attending: Charlsie Quest  Referring Physician/Provider: Caryl Pina, MD  Date: 07/29/2023 Duration: 25.56 mins  Patient history: 62 yo M presented to the ED yesterday afternoon with a one week history of AMS and back pain. EEG to evaluate for seizure  Level of alertness: Awake  AEDs during EEG study: None  Technical aspects: This EEG study was done with scalp electrodes positioned according to the 10-20 International system of electrode placement. Electrical activity was reviewed with band pass filter of 1-70Hz , sensitivity of 7 uV/mm, display speed of 55mm/sec with a 60Hz  notched filter applied as appropriate. EEG data were recorded continuously and digitally stored.  Video monitoring was available and reviewed as appropriate.  Description: No posterior dominant rhythm was seen. EEG showed continuous generalized 3 to 6 Hz theta-delta slowing admixed with 12-14 Hz beta activity distributed symmetrically and diffusely.  Physiologic photic driving was not seen during photic stimulation.  Hyperventilation was not performed.     ABNORMALITY - Continuous slow, generalized  IMPRESSION: This study is suggestive of moderate diffuse encephalopathy. No seizures or epileptiform discharges were seen throughout the recording.  Brayon Bielefeld Annabelle Harman

## 2023-07-29 NOTE — Progress Notes (Signed)
Echocardiogram 2D Echocardiogram has been performed.  Cody Pena 07/29/2023, 5:27 PM

## 2023-07-29 NOTE — Plan of Care (Signed)
  Problem: Education: Goal: Individualized Educational Video(s) Outcome: Progressing   Problem: Coping: Goal: Ability to adjust to condition or change in health will improve Outcome: Progressing   Problem: Fluid Volume: Goal: Ability to maintain a balanced intake and output will improve Outcome: Progressing   

## 2023-07-29 NOTE — Assessment & Plan Note (Signed)
MRI brain imaging resulted demonstrating mixed cystic and solid enhancing mass to the left occiput lobe.  There was also a punctate acute ischemic nonhemorrhagic infarct at the right parietal cortex.

## 2023-07-29 NOTE — Plan of Care (Signed)
  Problem: Education: Goal: Ability to describe self-care measures that may prevent or decrease complications (Diabetes Survival Skills Education) will improve Outcome: Not Progressing Goal: Individualized Educational Video(s) Outcome: Not Progressing   Problem: Coping: Goal: Ability to adjust to condition or change in health will improve Outcome: Not Progressing   Problem: Fluid Volume: Goal: Ability to maintain a balanced intake and output will improve Outcome: Not Progressing   Problem: Health Behavior/Discharge Planning: Goal: Ability to identify and utilize available resources and services will improve Outcome: Not Progressing Goal: Ability to manage health-related needs will improve Outcome: Not Progressing   Problem: Metabolic: Goal: Ability to maintain appropriate glucose levels will improve Outcome: Not Progressing   Problem: Nutritional: Goal: Maintenance of adequate nutrition will improve Outcome: Not Progressing Goal: Progress toward achieving an optimal weight will improve Outcome: Not Progressing   Problem: Tissue Perfusion: Goal: Adequacy of tissue perfusion will improve Outcome: Not Progressing   Problem: Education: Goal: Knowledge of General Education information will improve Description: Including pain rating scale, medication(s)/side effects and non-pharmacologic comfort measures Outcome: Not Progressing

## 2023-07-29 NOTE — Discharge Instructions (Addendum)
Information on my medicine - ELIQUIS (apixaban)  This medication education was reviewed with me or my healthcare representative as part of my discharge preparation.   Why was Eliquis prescribed for you? Eliquis was prescribed for you to reduce the risk of forming blood clots that can cause a stroke if you have a medical condition called atrial fibrillation (a type of irregular heartbeat) and because you had a blood clot in your lungs (Pulmonary embolism or PE).  What do You need to know about Eliquis ? Take your Eliquis TWICE DAILY - one tablet in the morning and one tablet in the evening with or without food.  It would be best to take the doses about the same time each day.  If you have difficulty swallowing the tablet whole please discuss with your pharmacist how to take the medication safely.  Take Eliquis exactly as prescribed by your doctor and DO NOT stop taking Eliquis without talking to the doctor who prescribed the medication.  Stopping may increase your risk of developing a new clot or stroke.  Refill your prescription before you run out.  After discharge, you should have regular check-up appointments with your healthcare provider that is prescribing your Eliquis.  In the future your dose may need to be changed if your kidney function or weight changes by a significant amount or as you get older.  What do you do if you miss a dose? If you miss a dose, take it as soon as you remember on the same day and resume taking twice daily.  Do not take more than one dose of ELIQUIS at the same time.  Important Safety Information A possible side effect of Eliquis is bleeding. You should call your healthcare provider right away if you experience any of the following: Bleeding from an injury or your nose that does not stop. Unusual colored urine (red or dark brown) or unusual colored stools (red or black). Unusual bruising for unknown reasons. A serious fall or if you hit your head (even  if there is no bleeding).  Some medicines may interact with Eliquis and might increase your risk of bleeding or clotting while on Eliquis. To help avoid this, consult your healthcare provider or pharmacist prior to using any new prescription or non-prescription medications, including herbals, vitamins, non-steroidal anti-inflammatory drugs (NSAIDs) and supplements.  This website has more information on Eliquis (apixaban): [email or web address].cy.

## 2023-07-29 NOTE — Progress Notes (Signed)
Lower extremity venous duplex completed. Please see CV Procedures for preliminary results.  Shona Simpson, RVT 07/29/23 2:12 PM

## 2023-07-29 NOTE — Consult Note (Addendum)
NAME:  Cody Pena, MRN:  [id number], DOB:  13-Jun-1961, LOS: 1 ADMISSION DATE:  07/28/2023, CONSULTATION DATE:  12/16 REFERRING MD:  Thedore Mins, CHIEF COMPLAINT:  Lung mass, PE, Mets to brain   History of Present Illness:   62 y.o. 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 CBG was 352. Patient was found by EMS to be in new onset A-fib with rates as high as 170. Received 20 mg of Cardizem and 750 of normal saline prior to arrival. He arrived in rapid A-fib. He has no known history of a-fib.   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  Left upper lobe bronchus is occluded by the mass. Multiple pulmonary nodules in the right lung consistent with metastasis. Mediastinal, bilateral hilar, left axillary lymphadenopathy consistent with metastasis .There is also a small left and minimal right pleural effusion.   CT abdomen and pelvis demonstrated bilateral pyelonephritis, left greater than right.  Stent in the right superior femoral artery may be occluded.  CTA demonstrated small subsegmental embolic burden in the right lower lobe with no evidence of right heart strain. Most likely  secondary to suspected malignancy. Pt. Also has new onset A fib with CHA2D2VASc of 3 that typically would need anticoagulation as well. However due to brain mass ,the risk of hemorrhage from brain mass likely is higher and outweighs  the benefit of treating a single subsegmental PE. Plan is to consider IVC filter after resolution of  infection (Pyelonephritis) .    In the ED, patient was noted to be afebrile but in atrial fibrillation with RVR with heart rates up to 170s, BP of 152/89 , CBC with leukocytosis of 18K, thrombocytosis of 515, no anemia.  Lactate was elevated 3.7.  CMP with sodium 132, CBG of 228, normal creatinine of 1, hypercalcemia of 12.3. Troponin was elevated but flat at 21-24.  Pt was started on  IV diltiazem infusion, and administered IV vancomycin and cefepime.   PCCM have been asked to assist for consideration of bronchoscopy for tissue sampling in setting of lung mass in a smoker , and PE.  Pertinent  Medical History   Past Medical History:  Diagnosis Date   Arthritis    COVID    has had Covid 3 times, not sick with it   COVID    has had 3 times mild to no symptoms   Depression    Diabetes mellitus without complication (HCC)    pt states he has now been told that he is a type 1 diabetic.   Dyspnea    albuterol inhaler   HLD (hyperlipidemia)    Hypertension    Peripheral vascular disease (HCC)      Significant Hospital Events: Including procedures, antibiotic start and stop dates in addition to other pertinent events   07/28/2023 Admission after fall 4-6 week history of dizziness and worsening shortness of breath  CTA Chest 07/29/2023 Small subsegmental embolic burden in the right lower lobe, with no evidence of right heart strain. 2. Large 12.7 x 9 x 14.6 cm left upper lobe mass with bronchovascular encasement, occluding the left upper lobe bronchus and almost completely occluding the left upper lobe pulmonary artery, and occluding the left superior pulmonary vein. 3. Multiple right lung nodules consistent with metastases. 4. Bilateral bronchial thickening but proportionately greater in the left lower lobe where there is peribronchovascular interstitial and airspace opacities  which could indicate changes of bronchopneumonia or lymphangitic carcinomatosis. 5. Small left and minimal right pleural effusions. 6. Multifocal mediastinal, right hilar and left axillary adenopathy. 7. Aortic and coronary artery atherosclerosis. 8. Emphysema.  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 Mild surrounding vasogenic edema without significant regional mass effect or midline shift. 2. 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 3. Underlying moderately advanced chronic microvascular ischemic disease.  MR Lumbar Spine 07/28/2023 Abnormal marrow replacement with heterogeneous enhancement within the L1 vertebral body, concerning for osseous metastatic disease. Associated mild pathologic compression fracture measures up to 20% without bony retropulsion. No extra osseous or epidural extension of tumor. 2. No other evidence for metastatic disease within the lumbar spine.  Interim History / Subjective:  Pt. States he feels weak and anxious. He gets short of breath while talking, and states he has felt dizzy for about the last 4 weeks. He has had nausea and has not been able to eat much for about 1 month. He is processing the news of his suspected lung cancer with mets States he quit smoking 1 month ago because he could not stand the smell.   Objective   Blood pressure 110/75, pulse (!) 52, temperature 98.3 F (36.8 C), temperature source Oral, resp. rate 13, height 6\' 3"  (1.905 m), weight 77.7 kg, SpO2 98%.        Intake/Output Summary (Last 24 hours) at 07/29/2023 0749 Last data filed at 07/29/2023 0600 Gross per 24 hour  Intake 656.85 ml  Output 1000 ml  Net -343.15 ml   Filed Weights   07/28/23 1518 07/29/23 0200  Weight: 97.1 kg 77.7 kg    Examination: General:  Pale, Alert male, in NAD, supine in  bed. HENT:  NCAT, PERRLA, MM pink and dry, No LAD Lungs:  Bilateral chest excursion, Clear but diminished per bases and decreased left upper chest , dry non-productive cough Cardiovascular: S1, S2, Irr rhythm, a fib per tele Abdomen:  Soft , NT, ND, BS +, Body mass index is 21.41 kg/m. Extremities: Left BKA, MAE x 4, muscle wasting Neuro:  Awake and alert x 3. Follows commands , appropriate GU:  Urinal  Resolved Hospital Problem list     Assessment & Plan:  Large  left upper lobe mass with broncho vascular encasement, occluding the left upper lobe bronchus and almost completely occluding the left upper lobe pulmonary artery, and occluding the left superior pulmonary vein. 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 Oncology referral per Primary Team Will most likely need radiation oncology in near future  to treat left upper lobe  bronchus  Consider evaluation of  small left pleural effusion for  possibility of thoracentesis and fluid to evaluate for cytology.  Titrate oxygen to maintain sats of > 90%  Small sub segmental PE in the right lower lobe, No evidence of heart strain Risk of hemorrhage from brain mass , so unable to anticoagulate . Plan Obtain Echo of the heart Consider IVC filter after resolution of  infection (Pyelonephritis) .   I spoke with the patient about our suspicion of lung cancer that has spread to the brain and spine. He had stated that he did not want his family to know this was a suspected diagnosis. I asked him why he did not want them to know. He stated he needs to tell his daughter first, as she is very strong and can help him break the news to his family. His wife has cervical cancer and she has anxiety and he  is concerned how she will handle the diagnosis. I explained he will need his family to help make decisions moving forward, and to provide him with support as he navigates the treatment of his suspected disease.  I told him they will have questions when he is taken for biopsy. His plan is to tell his daughter when he has a chance. He understands the need for biopsy. We discussed the risks of bleeding, infection, pneumothorax, and adverse reaction to anesthesia. He is concerned about painful procedures, and was reassured when I explained that the biopsy is done under general anesthesia.   Bevelyn Ngo, MSN, AGACNP-BC [facility] See Amion for personal pager PCCM on call pager [phone]    Best Practice (right click and "Reselect all SmartList Selections" daily)   Best practice per Primary team.  Labs   CBC: Recent Labs  Lab 07/28/23 1548 07/29/23 0422  WBC 18.6* 18.7*  NEUTROABS 15.4*  --   HGB 13.0 11.3*  HCT 40.9 35.7*  MCV 83.1 83.2  PLT 515* 391    Basic Metabolic Panel: Recent Labs  Lab 07/28/23 1548 07/29/23 0422  NA 132* 130*  K 4.5 4.5  CL 95* 97*  CO2 24 24  GLUCOSE 228* 189*  BUN 32* 30*  CREATININE 1.01 1.19  CALCIUM 12.3* 12.3*   GFR: Estimated Creatinine Clearance: 68.9 mL/min (by C-G formula based on SCr of 1.19 mg/dL). Recent Labs  Lab 07/28/23 1548 07/28/23 1553 07/28/23 1801 07/28/23 2304 07/29/23 0422  WBC 18.6*  --   --   --  18.7*  LATICACIDVEN  --  3.7* 2.0* 1.6  --     Liver Function Tests: Recent Labs  Lab 07/28/23 1548 07/29/23 0422  AST 21 15  ALT 13 11  ALKPHOS 95 85  BILITOT 0.9 0.6  PROT 7.3 6.5  ALBUMIN 2.2* 2.0*   No results for input(s): "LIPASE", "AMYLASE" in the last 168 hours. No results for input(s): "AMMONIA" in the last 168 hours.  ABG    Component Value Date/Time   PHART 7.421 12/07/2014 0350   PCO2ART 42.4 12/07/2014 0350   PO2ART 55.0 (L) 12/07/2014 0350   HCO3 27.5 (H)  12/07/2014 0350   TCO2 35 (H) 08/11/2021 0549   O2SAT 89.0 12/07/2014 0350     Coagulation Profile: No results for input(s): "INR", "PROTIME" in the last 168 hours.  Cardiac Enzymes: No results for input(s): "CKTOTAL", "CKMB", "CKMBINDEX", "TROPONINI" in the last 168 hours.  HbA1C: Hgb A1c MFr Bld  Date/Time Value Ref Range Status  07/28/2023 11:04 PM 9.9 (H) 4.8 - 5.6 % Final    Comment:    (  NOTE) Pre diabetes:          5.7%-6.4%  Diabetes:              >6.4%  Glycemic control for   <7.0% adults with diabetes   01/06/2023 04:53 AM 10.8 (H) 4.8 - 5.6 % Final    Comment:    (NOTE)         Prediabetes: 5.7 - 6.4         Diabetes: >6.4         Glycemic control for adults with diabetes: <7.0     CBG: Recent Labs  Lab 07/28/23 2311  GLUCAP 314*    Review of Systems:   Dizziness, Shortness of breath, weakness   Past Medical History:  He,  has a past medical history of Arthritis, COVID, COVID, Depression, Diabetes mellitus without complication (HCC), Dyspnea, HLD (hyperlipidemia), Hypertension, and Peripheral vascular disease (HCC).   Surgical History:   Past Surgical History:  Procedure Laterality Date   ABDOMINAL AORTOGRAM W/LOWER EXTREMITY N/A 08/11/2021   Procedure: ABDOMINAL AORTOGRAM W/LOWER EXTREMITY;  Surgeon: Leonie Douglas, MD;  Location: [facility];  Service: Cardiovascular;  Laterality: N/A;   AMPUTATION Left 11/15/2017   Procedure: LEFT FOOT 2ND RAY AMPUTATION;  Surgeon: Nadara Mustard, MD;  Location: [facility] OR;  Service: Orthopedics;  Laterality: Left;   AMPUTATION Right 08/25/2021   Procedure: RIGHT TRANSMETATARSAL AMPUTATION;  Surgeon: Nadara Mustard, MD;  Location[facility] OR;  Service: Orthopedics;  Laterality: Right;   AMPUTATION Right 09/27/2021   Procedure: BELOW KNEE AMPUTATION;  Surgeon: Nadara Mustard, MD;  Location: [facility] OR;  Service: Orthopedics;  Laterality: Right;   APPLICATION OF WOUND VAC  12/27/2021   Procedure: APPLICATION OF WOUND VAC;   Surgeon: Nadara Mustard, MD;  Location: [facility] OR;  Service: Orthopedics;;   CERVICAL FUSION     I & D EXTREMITY Left 12/27/2021   Procedure: LEFT LEG DEBRIDEMENT ULCERS;  Surgeon: Nadara Mustard, MD;  Location: [facility] OR;  Service: Orthopedics;  Laterality: Left;   I & D EXTREMITY Left 01/31/2022   Procedure: LEFT KNEE DEBRIDEMENT AND APPLY TISSUE GRAFT;  Surgeon: Nadara Mustard, MD;  Location: [facility] OR;  Service: Orthopedics;  Laterality: Left;   IR ANGIOGRAM EXTREMITY LEFT  11/14/2017   IR ANGIOGRAM SELECTIVE EACH ADDITIONAL VESSEL  11/14/2017   IR ANGIOGRAM SELECTIVE EACH ADDITIONAL VESSEL  11/14/2017   IR TIB-PERO ART PTA MOD SED  11/14/2017   IR TIB-PERO ART UNI PTA EA ADD VESSEL MOD SED  11/14/2017   IR US GUIDE VASC ACCESS LEFT  11/14/2017   PERIPHERAL VASCULAR INTERVENTION Right 08/11/2021   Procedure: PERIPHERAL VASCULAR INTERVENTION;  Surgeon: Leonie Douglas, MD;  Location: [facility];  Service: Cardiovascular;  Laterality: Right;  rt sfa stent   STUMP REVISION Right 01/09/2023   Procedure: REVISION RIGHT BELOW KNEE AMPUTATION;  Surgeon: Nadara Mustard, MD;  Location: [facility] OR;  Service: Orthopedics;  Laterality: Right;     Social History:   reports that he has been smoking cigarettes. He has a 23.5 pack-year smoking history. He has never used smokeless tobacco. He reports that he does not drink alcohol and does not use drugs.   Family History:  His family history is not on file.   Allergies Allergies  Allergen Reactions   Ceftriaxone Itching, Nausea And Vomiting and Rash    Noted minutes after starting medication No rash noted with Ancef on 01-31-2022 during general anesthesia Has received  cefepime and cefazolin without reaction     Home Medications  Prior to Admission medications   Medication Sig Start Date End Date Taking? Authorizing Provider  atorvastatin (LIPITOR) 10 MG tablet Take 10 mg by mouth daily. 06/05/23  Yes [provider]  pioglitazone (ACTOS) 15 MG tablet Take  15 mg by mouth daily. 04/24/23  Yes [provider]  acetaminophen (TYLENOL) 500 MG tablet Take 2 tablets (1,000 mg total) by mouth every 6 (six) hours as needed for mild pain or fever (For the first 3 days, may take 2 tabs (1000 mg total) 4 times daily scheduled following which can take 2 tabs (1000 mg total) 4 times daily as needed.). 01/12/23   Hongalgi, Maximino Greenland, MD  ascorbic acid (VITAMIN C) 1000 MG tablet Take 1 tablet (1,000 mg total) by mouth daily. 01/13/23   Hongalgi, Maximino Greenland, MD  docusate sodium (COLACE) 100 MG capsule Take 1 capsule (100 mg total) by mouth daily. 01/13/23   Hongalgi, Maximino Greenland, MD  gabapentin (NEURONTIN) 600 MG tablet Take 1 tablet (600 mg total) by mouth 3 (three) times daily. 10/05/21   Angiulli, Mcarthur Rossetti, PA-C  insulin aspart protamine - aspart (NOVOLOG 70/30 MIX) (70-30) 100 UNIT/ML FlexPen Inject 10 Units into the skin 2 (two) times daily with a meal. 01/12/23   Hongalgi, Maximino Greenland, MD  Insulin Pen Needle 32G X 4 MM MISC Use as directed two times daily. 01/12/23   Hongalgi, Maximino Greenland, MD  methocarbamol (ROBAXIN) 500 MG tablet Take 1 tablet (500 mg total) by mouth every 6 (six) hours as needed for muscle spasms. 01/12/23   Hongalgi, Maximino Greenland, MD  Multiple Vitamin (MULTIVITAMIN WITH MINERALS) TABS tablet Take 1 tablet by mouth daily. 01/13/23   Hongalgi, Maximino Greenland, MD  nicotine (NICODERM CQ - DOSED IN MG/24 HOURS) 14 mg/24hr patch Place 1 patch (14 mg total) onto the skin daily. 01/13/23   Hongalgi, Maximino Greenland, MD  oxycodone (OXY-IR) 5 MG capsule Take 1 capsule (5 mg total) by mouth every 6 (six) hours as needed. 02/22/23   Adonis Huguenin, NP  Oxycodone HCl 10 MG TABS Take 0.5 tablets (5 mg total) by mouth every 4 (four) hours as needed. 02/08/23   Adonis Huguenin, NP  polyethylene glycol powder (GLYCOLAX/MIRALAX) 17 GM/SCOOP powder Take 17 g by mouth daily as needed for mild constipation. 01/12/23   Hongalgi, Maximino Greenland, MD     Pulmonary APP Time : 50 minutes

## 2023-07-29 NOTE — Consult Note (Addendum)
NEUROLOGY CONSULT NOTE   Date of service: July 29, 2023 Patient Name: Cody Pena MRN:  [id number] DOB:  22-May-1961 Chief Complaint: AMS and back pain Requesting Provider: Anselm Jungling, DO  History of Present Illness  Cody Pena is a 62 y.o. 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 CBG was 352. Patient was found by EMS to be in new onset A-fib with rates as high as 170. Received 20 mg of Cardizem and 750 of normal saline prior to arrival. He arrived in rapid A-fib. He has no known history of a-fib.   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     ROS  Has low back pain. No headache, fever, CP or SOB. Does not endorse any additional symptoms except as noted in the HPI.  Past History   Past Medical History:  Diagnosis Date   Arthritis    COVID    has had Covid 3 times, not sick with it   COVID    has had 3 times mild to no symptoms   Depression    Diabetes mellitus without complication (HCC)    pt states he has now been told that he is a type 1 diabetic.   Dyspnea    albuterol inhaler   HLD (hyperlipidemia)    Hypertension    Peripheral vascular disease (HCC)     Past Surgical History:  Procedure Laterality Date   ABDOMINAL AORTOGRAM W/LOWER EXTREMITY N/A 08/11/2021   Procedure: ABDOMINAL AORTOGRAM W/LOWER EXTREMITY;  Surgeon: Leonie Douglas, MD;  Location: [facility];  Service: Cardiovascular;  Laterality: N/A;   AMPUTATION Left 11/15/2017    Procedure: LEFT FOOT 2ND RAY AMPUTATION;  Surgeon: Nadara Mustard, MD;  Location: [facility] OR;  Service: Orthopedics;  Laterality: Left;   AMPUTATION Right 08/25/2021   Procedure: RIGHT TRANSMETATARSAL AMPUTATION;  Surgeon: Nadara Mustard, MD;  Location: [facility] OR;  Service: Orthopedics;  Laterality: Right;   AMPUTATION Right 09/27/2021   Procedure: BELOW KNEE AMPUTATION;  Surgeon: Nadara Mustard, MD;  Location: [facility] OR;  Service: Orthopedics;  Laterality: Right;   APPLICATION OF WOUND VAC  12/27/2021   Procedure: APPLICATION OF WOUND VAC;  Surgeon: Nadara Mustard, MD;  Location: [facility] OR;  Service: Orthopedics;;   CERVICAL FUSION     I & D EXTREMITY Left 12/27/2021   Procedure: LEFT LEG DEBRIDEMENT ULCERS;  Surgeon: Nadara Mustard, MD;  Location: [facility] OR;  Service: Orthopedics;  Laterality: Left;   I & D EXTREMITY Left 01/31/2022   Procedure: LEFT KNEE DEBRIDEMENT AND APPLY TISSUE GRAFT;  Surgeon: Nadara Mustard, MD;  Location: [facility] OR;  Service: Orthopedics;  Laterality: Left;   IR ANGIOGRAM EXTREMITY LEFT  11/14/2017   IR ANGIOGRAM SELECTIVE EACH ADDITIONAL VESSEL  11/14/2017   IR ANGIOGRAM SELECTIVE EACH ADDITIONAL VESSEL  11/14/2017   IR TIB-PERO ART PTA MOD SED  11/14/2017   IR TIB-PERO ART UNI PTA EA ADD VESSEL MOD SED  11/14/2017   IR US GUIDE VASC ACCESS LEFT  11/14/2017  PERIPHERAL VASCULAR INTERVENTION Right 08/11/2021   Procedure: PERIPHERAL VASCULAR INTERVENTION;  Surgeon: Leonie Douglas, MD;  Location: [facility];  Service: Cardiovascular;  Laterality: Right;  rt sfa stent   STUMP REVISION Right 01/09/2023   Procedure: REVISION RIGHT BELOW KNEE AMPUTATION;  Surgeon: Nadara Mustard, MD;  Location: [facility] OR;  Service: Orthopedics;  Laterality: Right;    Family History: History reviewed. No pertinent family history.  Social History  reports that he has been smoking cigarettes. He has a 23.5 pack-year smoking history. He has never used smokeless tobacco. He reports that he does not drink alcohol and does  not use drugs.  Allergies  Allergen Reactions   Ceftriaxone Itching, Nausea And Vomiting and Rash    Noted minutes after starting medication No rash noted with Ancef on 01-31-2022 during general anesthesia Has received cefepime and cefazolin without reaction    Medications   Current Facility-Administered Medications:    0.9 %  sodium chloride infusion, , Intravenous, Continuous, Tu, Ching T, DO, Last Rate: 100 mL/hr at 07/28/23 2320, New Bag at 07/28/23 2320   ceFEPIme (MAXIPIME) 2 g in sodium chloride 0.9 % 100 mL IVPB, 2 g, Intravenous, Q8H, Jardin, Carla G, RPH   dexamethasone (DECADRON) injection 4 mg, 4 mg, Intravenous, QHS, Tu, Ching T, DO, 4 mg at 07/28/23 2319   diltiazem (CARDIZEM) 125 mg in dextrose 5% 125 mL (1 mg/mL) infusion, 5-15 mg/hr, Intravenous, Continuous, Kommor, [place], MD, Last Rate: 15 mL/hr at 07/28/23 1916, 15 mg/hr at 07/28/23 1916   insulin aspart (novoLOG) injection 0-20 Units, 0-20 Units, Subcutaneous, TID PC & HS, Tu, Ching T, DO, 15 Units at 07/28/23 2326  Current Outpatient Medications:    atorvastatin (LIPITOR) 10 MG tablet, Take 10 mg by mouth daily., Disp: , Rfl:    pioglitazone (ACTOS) 15 MG tablet, Take 15 mg by mouth daily., Disp: , Rfl:    acetaminophen (TYLENOL) 500 MG tablet, Take 2 tablets (1,000 mg total) by mouth every 6 (six) hours as needed for mild pain or fever (For the first 3 days, may take 2 tabs (1000 mg total) 4 times daily scheduled following which can take 2 tabs (1000 mg total) 4 times daily as needed.)., Disp: , Rfl:    ascorbic acid (VITAMIN C) 1000 MG tablet, Take 1 tablet (1,000 mg total) by mouth daily., Disp: , Rfl:    docusate sodium (COLACE) 100 MG capsule, Take 1 capsule (100 mg total) by mouth daily., Disp: 10 capsule, Rfl: 0   gabapentin (NEURONTIN) 600 MG tablet, Take 1 tablet (600 mg total) by mouth 3 (three) times daily., Disp: 90 tablet, Rfl: 3   insulin aspart protamine - aspart (NOVOLOG 70/30 MIX) (70-30) 100 UNIT/ML  FlexPen, Inject 10 Units into the skin 2 (two) times daily with a meal., Disp: 15 mL, Rfl: 0   Insulin Pen Needle 32G X 4 MM MISC, Use as directed two times daily., Disp: 100 each, Rfl: 0   methocarbamol (ROBAXIN) 500 MG tablet, Take 1 tablet (500 mg total) by mouth every 6 (six) hours as needed for muscle spasms., Disp: 30 tablet, Rfl: 0   Multiple Vitamin (MULTIVITAMIN WITH MINERALS) TABS tablet, Take 1 tablet by mouth daily., Disp: , Rfl:    nicotine (NICODERM CQ - DOSED IN MG/24 HOURS) 14 mg/24hr patch, Place 1 patch (14 mg total) onto the skin daily., Disp: 28 patch, Rfl: 0   oxycodone (OXY-IR) 5 MG capsule, Take 1 capsule (5 mg total) by mouth every  6 (six) hours as needed., Disp: 28 capsule, Rfl: 0   Oxycodone HCl 10 MG TABS, Take 0.5 tablets (5 mg total) by mouth every 4 (four) hours as needed., Disp: 42 tablet, Rfl: 0   polyethylene glycol powder (GLYCOLAX/MIRALAX) 17 GM/SCOOP powder, Take 17 g by mouth daily as needed for mild constipation., Disp: 238 g, Rfl: 0  Vitals   Vitals:   07/29/23 0030 07/29/23 0045 07/29/23 0100 07/29/23 0115  BP: 121/78 115/68 100/73 101/78  Pulse: 82     Resp: 15     Temp:      TempSrc:      SpO2:      Weight:      Height:        Body mass index is 26.05 kg/m.  Physical Exam   Physical Exam  HEENT-  [place]/AT   Lungs- Respirations unlabored Extremities- No edema  Neurological Examination Mental Status: Awake and alert, oriented to self, that he is in the hospital, the city, the state and the day of the week, but not the year or month. Mild to moderate confusion, but calm, pleasant and cooperative. Speech fluent with intact naming and comprehension for basic commands, but had more difficulty with clear comprehension of a 3-step command when performing FNF.  Cranial Nerves: II: Temporal visual fields intact with no extinction to DSS (has occipital lobe lesion on MRI). PERRL  III,IV, VI: No ptosis. EOMI but with saccadic visual pursuits.  V: Temp  sensation equal bilaterally  VII: Smile symmetric VIII: Hearing intact to voice IX,X: No hoarseness XI: Symmetric shoulder shrug XII: Midline tongue extension Motor: BUE 5/5 proximally and distally BLE 5/5 proximally and distally  No pronator drift.  Negative orbiting fingers test.  Sensory: Temp and light touch intact throughout, bilaterally. No extinction to DSS.  Deep Tendon Reflexes: 2+ and symmetric bilateral brachioradialis and patellae.  Cerebellar: No ataxia with FNF bilaterally, but slow and had difficulty comprehending the command  Gait: Deferred   Labs/Imaging/Neurodiagnostic studies   CBC:  Recent Labs  Lab 08-08-2023 1548  WBC 18.6*  NEUTROABS 15.4*  HGB 13.0  HCT 40.9  MCV 83.1  PLT 515*   Basic Metabolic Panel:  Lab Results  Component Value Date   NA 132 (L) 08-08-2023   K 4.5 Aug 08, 2023   CO2 24 August 08, 2023   GLUCOSE 228 (H) 2023/08/08   BUN 32 (H) 08-08-23   CREATININE 1.01 08/08/23   CALCIUM 12.3 (H) 2023-08-08   GFRNONAA >60 2023/08/08   GFRAA >60 11/14/2017   Lipid Panel:  Lab Results  Component Value Date   LDLCALC 48 07/24/2021   HgbA1c:  Lab Results  Component Value Date   HGBA1C 9.9 (H) 08/08/23   Urine Drug Screen:     Component Value Date/Time   LABOPIA NONE DETECTED 12/07/2014 0325   COCAINSCRNUR NONE DETECTED 12/07/2014 0325   LABBENZ NONE DETECTED 12/07/2014 0325   AMPHETMU NONE DETECTED 12/07/2014 0325   THCU NONE DETECTED 12/07/2014 0325   LABBARB NONE DETECTED 12/07/2014 0325    Alcohol Level     Component Value Date/Time   ETH <5 12/07/2014 0340   INR  Lab Results  Component Value Date   INR 1.05 11/10/2017   CT Head without contrast: 1. Cystic and solid 3.4 cm mass in the left parietal lobe, which is concerning for a metastasis given the findings on CT of the chest/abdomen/pelvis. . 2. Surrounding edema and mild regional mass effect without midline shift.  CTA of chest: 1. Small  subsegmental embolic  burden in the right lower lobe, with no evidence of right heart strain. 2. Large 12.7 x 9 x 14.6 cm left upper lobe mass with bronchovascular encasement, occluding the left upper lobe bronchus and almost completely occluding the left upper lobe pulmonary artery, and occluding the left superior pulmonary vein. 3. Multiple right lung nodules consistent with metastases. 4. Bilateral bronchial thickening but proportionately greater in the left lower lobe where there is peribronchovascular interstitial and airspace opacities which could indicate changes of bronchopneumonia or lymphangitic carcinomatosis. 5. Small left and minimal right pleural effusions. 6. Multifocal mediastinal, right hilar and left axillary adenopathy. 7. Aortic and coronary artery atherosclerosis. 8. Emphysema.  CT of chest with contrast: 1. Large heterogenous mass in the left upper lobe/suprahilar region consistent with malignancy. 2. Diffuse bronchial wall thickening in the lingula and left lower lobe. Differential considerations include lymphangitic spread of tumor or airway infection/inflammation. 3. Multiple pulmonary nodules in the right lung consistent with metastases. 4. Mediastinal, bilateral hilar, and left axillary lymphadenopathy consistent with metastases. 5. Small left pleural effusion.  CT of abdomen/pelvis with contrast: 1. Bilateral pyelonephritis, left-greater-than-right. 2. The stent in the right superficial femoral artery is not definitively opacified and may be occluded though this is incompletely evaluated on non arterial phase exam. 3. 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 4. Aortic Atherosclerosis  MRI Brain w/wo contrast (Personally reviewed): 1. 4.0 x 3.5 x 3.7 cm mixed cystic and solid peripherally enhancing mass at the left occipital lobe, most concerning for a solitary intracranial  metastasis. Mild surrounding vasogenic edema without significant regional mass effect or midline shift. 2. 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 3. Underlying moderately advanced chronic microvascular ischemic disease.   MRI L-spine w/wo contrast: 1. Abnormal marrow replacement with heterogeneous enhancement within the L1 vertebral body, concerning for osseous metastatic disease. Associated mild pathologic compression fracture measures up to 20% without bony retropulsion. No extra osseous or epidural extension of tumor. 2. No other evidence for metastatic disease within the lumbar spine. 3. Multilevel lumbar spondylosis with resultant moderate canal stenosis at L2-3. Associated multilevel foraminal narrowing as above, moderate to severe in nature at L4-5 bilaterally. 4. Changes of acute pyelonephritis involving the left greater than right kidneys, described on prior CT.   ASSESSMENT  62 y.o. 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 Patient has been found to be in new onset rapid A-fib with rates as high as 170. He has no known history of a-fib. 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 He also has a subsegmental PE. - Exam reveals moderate confusion without aphasia. Despite his occipital lobe lesion on MRI, he had no extinction to DSS in temporal visual fields bilaterally. Of note, he most likely does have a right hemianopsia that was not  evident on exam due to his confusion and difficulty following complex commands. - Imaging as above.  - Acute comorbid conditions: - New onset atrial  fibrillation. May be due to an intrinsic cardiac conduction abnormality versus acute and reversible secondary to PE and sepsis.  - Acute bilateral pyelonephritis - Sepsis secondary to pyelonephritis - Newly diagnosed lung mass.  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 Suspect lung primary given his history of smoking.  - Punctate acute right parietal lobe ischemic infarction, most likely cardioembolic in the setting of newly diagnosed atrial fibrillation. CHA2DS2-VASc score is 3. Estimated stroke risk is 3.2% per year; combined stroke/TIA.systemic embolism risk is 4.6% - Suspect hypercoagulable state due to malignancy - No symptoms concerning for seizure, although his fall 2 weeks ago could have been due to a seizure.   RECOMMENDATIONS  - Hospitalist has discussed risk of PE extension with Pulmonary/CCM and they feel that the risk of extension off anticoagulation is lower than the risk of hemorrhagic conversion of the brain metastasis.  - However, on review of the MRI by Neurology, there is no hemorrhage within the mass or adjacent to it, which lowers the risk of catastrophic bleeding at this location if heparin were to be started. On the other hand, bronchogenic carcinoma metastases to the brain are one of the typically hemorrhagic brain lesions, while lung carcinoma typically is not hemorrhagic.  - Anticoagulation decision making considerations: - Would hold off on anticoagulation for now. Based on the above, could consider anticoagulation later during this admission for secondary stroke prevention in the setting of his newly diagnosed atrial fibrillation and PE, if biopsy reveals the primary to not  be bronchogenic carcinoma, nor one of the other typical hemorrhagic brain mets, namely renal cell carcinoma, melanoma and choriocarcinoma.  - If heparin is being considered at a later time, would also consult Vascular Surgery to determine if it is safe from their standpoint given the iliac and femoral artery aneurysms seen on imaging - Will need Hematology/Oncology consult - Currently on Decadron 4 mg po at bedtime. Can hold off on higher higher daily dosing of Decadron for now as the adjacent mass effect from occipital lobe lesion does not appear to be causing significant neurological signs (although likely with right hemianopsia from the mass itself - see above).   - EEG (ordered) - TTE - Cardiac telemetry - CTA of head and neck (ordered) - PT/OT/Speech - ASA for now (ordered) - Per discussion with Hospitalist, the patient most likely cannot have an IVC filter placed at this time due to sepsis and acute pyelonephritis - Stroke Team to follow    ______________________________________________________________________    Dessa Phi, Libertie Hausler, MD [facility]

## 2023-07-29 NOTE — Progress Notes (Signed)
PHARMACY - ANTICOAGULATION CONSULT NOTE  Pharmacy Consult for heparin Indication:  new onset atrial fibrillation and new subsegmental pulmonary embolism  Allergies  Allergen Reactions   Ceftriaxone Itching, Nausea And Vomiting and Rash    Noted minutes after starting medication No rash noted with Ancef on 01-31-2022 during general anesthesia Has received cefepime and cefazolin without reaction    Patient Measurements: Height: 6\' 3"  (190.5 cm) Weight: 77.7 kg (171 lb 4.8 oz) IBW/kg (Calculated) : 84.5 Heparin Dosing Weight: 77 kg  Vital Signs: Temp: 98.5 F (36.9 C) (12/16 0700) Temp Source: Oral (12/16 0700) BP: 112/70 (12/16 0700) Pulse Rate: 63 (12/16 0700)  Labs: Recent Labs    07/28/23 1548 07/28/23 1756 07/29/23 0422  HGB 13.0  --  11.3*  HCT 40.9  --  35.7*  PLT 515*  --  391  CREATININE 1.01  --  1.19  TROPONINIHS 21* 24*  --     Estimated Creatinine Clearance: 68.9 mL/min (by C-G formula based on SCr of 1.19 mg/dL).   Medical History: Past Medical History:  Diagnosis Date   Arthritis    COVID    has had Covid 3 times, not sick with it   COVID    has had 3 times mild to no symptoms   Depression    Diabetes mellitus without complication (HCC)    pt states he has now been told that he is a type 1 diabetic.   Dyspnea    albuterol inhaler   HLD (hyperlipidemia)    Hypertension    Peripheral vascular disease (HCC)     Medications:  Medications Prior to Admission  Medication Sig Dispense Refill Last Dose/Taking   acetaminophen (TYLENOL) 500 MG tablet Take 2 tablets (1,000 mg total) by mouth every 6 (six) hours as needed for mild pain or fever (For the first 3 days, may take 2 tabs (1000 mg total) 4 times daily scheduled following which can take 2 tabs (1000 mg total) 4 times daily as needed.).   Past Week   atorvastatin (LIPITOR) 10 MG tablet Take 10 mg by mouth daily.   Past Week   gabapentin (NEURONTIN) 600 MG tablet Take 1 tablet (600 mg total) by  mouth 3 (three) times daily. 90 tablet 3 Past Week   pioglitazone (ACTOS) 15 MG tablet Take 15 mg by mouth daily.   Taking   insulin aspart protamine - aspart (NOVOLOG 70/30 MIX) (70-30) 100 UNIT/ML FlexPen Inject 10 Units into the skin 2 (two) times daily with a meal. (Patient not taking: Reported on 07/29/2023) 15 mL 0 Not Taking   Insulin Pen Needle 32G X 4 MM MISC Use as directed two times daily. 100 each 0    methocarbamol (ROBAXIN) 500 MG tablet Take 1 tablet (500 mg total) by mouth every 6 (six) hours as needed for muscle spasms. (Patient not taking: Reported on 07/29/2023) 30 tablet 0 Not Taking   Multiple Vitamin (MULTIVITAMIN WITH MINERALS) TABS tablet Take 1 tablet by mouth daily. (Patient not taking: Reported on 07/29/2023)   Not Taking   nicotine (NICODERM CQ - DOSED IN MG/24 HOURS) 14 mg/24hr patch Place 1 patch (14 mg total) onto the skin daily. (Patient not taking: Reported on 07/29/2023) 28 patch 0 Not Taking   oxycodone (OXY-IR) 5 MG capsule Take 1 capsule (5 mg total) by mouth every 6 (six) hours as needed. (Patient not taking: Reported on 07/29/2023) 28 capsule 0 Not Taking   Oxycodone HCl 10 MG TABS Take 0.5 tablets (5 mg  total) by mouth every 4 (four) hours as needed. (Patient not taking: Reported on 07/29/2023) 42 tablet 0 Not Taking   Scheduled:   aspirin EC  81 mg Oral Daily   dexamethasone (DECADRON) injection  4 mg Intravenous QHS   digoxin  0.125 mg Intravenous Q6H   diltiazem  90 mg Oral Q8H   insulin aspart  0-20 Units Subcutaneous TID PC & HS   Infusions:   sodium chloride     ceFEPime (MAXIPIME) IV 2 g (07/29/23 1009)   diltiazem (CARDIZEM) infusion 15 mg/hr (07/29/23 1006)    Assessment: 62 YOM admitted for sepsis and found to have new onset atrial fibrillation and new subsegmental pulmonary embolism. 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 After discussion with hospitalist MD and neurology MD, will proceed with heparin initation at this time for treatment of PE. Pharmacy has been consulted to assist with dosing.   Goal of Therapy:  Heparin level 0.3-0.5 units/ml - narrow goal due to patient at very high bleeding risk Monitor platelets by anticoagulation protocol: Yes   Plan:  Start heparin infusion at 1200 units/hr Check heparin level in 6 hours Monitor heparin level and CBC daily while on heparin Monitor for signs of bleeding daily  Enos Fling, PharmD PGY-1 Acute Care Pharmacy Resident 07/29/2023 11:03 AM

## 2023-07-29 NOTE — Progress Notes (Signed)
EEG complete - results pending 

## 2023-07-29 NOTE — Progress Notes (Signed)
   07/29/23 0925  TOC Brief Assessment  Insurance and Status Reviewed (Medicaid Prepaid [employer])  Patient has primary care physician Yes ([facility], [facility])  Home environment has been reviewed From home with Spouse  Prior level of function: Independent  Prior/Current Home Services No current home services  Social Drivers of Health Review SDOH reviewed interventions complete (Smoking cessation information on DC instructions)  Readmission risk has been reviewed Yes (11%)  Transition of care needs transition of care needs identified, TOC will continue to follow (SDOH needs addressed)   TOC following for additional recs- Please place consult if needed

## 2023-07-29 NOTE — Progress Notes (Signed)
Patient to CT at this time

## 2023-07-29 NOTE — ED Notes (Signed)
Patient back from CT.

## 2023-07-29 NOTE — Progress Notes (Addendum)
PROGRESS NOTE                                                                                                                                                                                                             Patient Demographics:    Cody Pena, is a 62 y.o. male, DOB - March 30, 1961, AOZ:[id number]  Outpatient Primary MD for the patient is [facility]    LOS - 1  Admit date - 07/28/2023    Chief Complaint  Patient presents with   Altered Mental Status   Hyperglycemia   Back Pain       Brief Narrative (HPI from H&P)    63 y.o. male with medical history significant of peripheral vascular disease s/p BKA, hypertension, insulin-dependent type 2 diabetes, and hyperlipidemia who presents with altered mental status and back pain.   Pt not able to provide much hx as he is lethargic after receiving IV morphine. He has been told of cancer in his imaging and appears depressed. Says his memory has been worse. Feels overwhelmed with his new cancer diagnosis. Denies any current pain although earlier had lower back pain. He is alert and oriented only to self although fully able to follow commands. Tells me he does not want his wife to know of his diagnoses yet.   Further workup in the ER is was suggestive of bilateral pyelonephritis with sepsis, new lung mass suspicious for malignancy with mets to spine, lymph nodes and brain, brain mass, small incidental CVA, PE, A-fib RVR and he was admitted.   Subjective:    Olene Floss today has, No headache, No chest pain, No abdominal pain - No Nausea, No new weakness tingling or numbness, no shortness of breath   Assessment  & Plan :    Sepsis (HCC)  -secondary to bilateral pyelonephritis - No signs of obstruction or hydronephrosis, continue empiric antibiotics and follow cultures, continue IV fluids, sepsis pathophysiology is improving  Paroxysmal atrial fibrillation  with RVR (HCC) -[place] vas 2 score of greater than 3.  - Newly diagnosed atrial fibrillation.  Likely related to his dehydration with hypercalcemia, sepsis and PE.  TSH stable, echo pending, on Cardizem drip, oral Cardizem, as needed IV Cardizem and IV Lopressor along with 2 doses of IV digoxin ordered.  Echo ordered and pending cardiology to evaluate as well.  On heparin drip after clearance with neurologist Dr. Pearlean Brownie.  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  Case discussed with pulmonary, they will see the patient and arrange for biopsy likely bronchoscopy, oncology also consulted to see.  Long-term prognosis does not appear to be good, will await oncology and pulmonary input.  Brain mass  -MRI confirms brain mass with some vasogenic edema.  For now Decadron and monitor  CVA (cerebral vascular accident) (HCC) - MRI brain imaging resulted demonstrating mixed cystic and solid enhancing mass to the left occiput lobe.  There was also a punctate acute ischemic nonhemorrhagic infarct at the right parietal cortex.  Case discussed with Dr. Pearlean Brownie stroke team on 07/29/2023 in detail, cleared for heparin without bolus for underlying PE and A-fib.  Basic stroke workup however no significant deficit from stroke standpoint this is likely incidental.  Acute pulmonary embolism (HCC)   - CTA noted, currently see discussions about heparin with stroke team above.  Heparin without bolus with caution and monitor.  Back pain  -  MRI of the L-spine shows suspicion for possible metastatic malignancy in the spine.  Supportive care for now.  Hypercalcemia  -will continue to hydrate overnight and follow repeat in the morning, will check PTH and PTH RP, aggressively hydrate and give IV fluid bolus.  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  Patient vascular  surgery follow-up.  S/P BKA (below knee amputation), right (HCC)  History of PAD, There is question of possible occluded right superior femoral artery stent on CT abdomen/pelvis but patient denies any claudication or pain, monitor clinically.  Outpatient vascular surgery follow-up.   T2DM (type 2 diabetes mellitus) (HCC)  -insulin-dependent T2DM , keep on resistant SSI while on steroids for cerebral edema, Semglee added  Lab Results  Component Value Date   HGBA1C 9.9 (H) 07/28/2023   CBG (last 3)  Recent Labs    07/28/23 2311 07/29/23 0831  GLUCAP 314* 188*           Condition - Extremely Guarded  Family Communication  : Called daughter on 07/29/2023 at 9 AM, no response.  Patient does not want wife to be called  Code Status : Full code  Consults  : Pulmonary, oncology, cardiology, neurology  PUD Prophylaxis :     Procedures  :     TTE.    CT chest abdomen pelvis.  CHEST: 1. Large heterogenous mass in the left upper lobe/suprahilar region consistent with malignancy. 2. Diffuse bronchial wall thickening in the lingula and left lower lobe. Differential considerations include lymphangitic spread of tumor or airway infection/inflammation. 3. Multiple pulmonary nodules in the right lung consistent with metastases. 4. Mediastinal, bilateral hilar, and left axillary lymphadenopathy consistent with metastases. 5. Small left pleural effusion. ABDOMEN/PELVIS: 1. Bilateral pyelonephritis, left-greater-than-right. 2. The stent in the right superficial femoral artery is not definitively opacified and may be occluded though this is incompletely evaluated on non arterial phase exam. 3. 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 Aortic Atherosclerosis    CTA chest.   1. Small subsegmental embolic burden in the right lower lobe, with no evidence of right heart strain. 2. Large 12.7 x 9 x 14.6 cm left upper  lobe mass with bronchovascular encasement, occluding the left upper lobe bronchus and  almost completely occluding the left upper lobe pulmonary artery, and occluding the left superior pulmonary vein. 3. Multiple right lung nodules consistent with metastases. 4. Bilateral bronchial thickening but proportionately greater in the left lower lobe where there is peribronchovascular interstitial and airspace opacities which could indicate changes of bronchopneumonia or lymphangitic carcinomatosis. 5. Small left and minimal right pleural effusions. 6. Multifocal mediastinal, right hilar and left axillary adenopathy. 7. Aortic and coronary artery atherosclerosis. 8. Emphysema. 9. These results will be called to the ordering clinician or representative by the Radiologist Assistant, and communication documented in the PACS or [employer]. Aortic Atherosclerosis  and Emphysema   MR L Spine - 1. Abnormal marrow replacement with heterogeneous enhancement within the L1 vertebral body, concerning for osseous metastatic disease. Associated mild pathologic compression fracture measures up to 20% without bony retropulsion. No extra osseous or epidural extension of tumor. 2. No other evidence for metastatic disease within the lumbar spine. 3. Multilevel lumbar spondylosis with resultant moderate canal stenosis at L2-3. Associated multilevel foraminal narrowing as above, moderate to severe in nature at L4-5 bilaterally. 4. Changes of acute pyelonephritis involving the left greater than right kidneys, described on prior CT.   MRI brain. 1. 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 Mild surrounding vasogenic edema without significant regional mass effect or midline shift. 2. 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 3. Underlying moderately advanced chronic microvascular ischemic  disease      Disposition Plan  :    Status is: Inpatient  DVT Prophylaxis  :    SCDs Start: 07/28/23 2115 .Marland Kitchen Heparin    Lab Results  Component Value Date   PLT 391 07/29/2023    Diet :  Diet Order             Diet regular Room service appropriate? Yes with Assist; Fluid consistency: Thin  Diet effective now                    Inpatient Medications  Scheduled Meds:  aspirin EC  81 mg Oral Daily   dexamethasone (DECADRON) injection  4 mg Intravenous QHS   digoxin  0.125 mg Intravenous Q6H   diltiazem  90 mg Oral Q8H   insulin aspart  0-20 Units Subcutaneous TID PC & HS   Continuous Infusions:  sodium chloride 75 mL/hr at 07/29/23 1116   ceFEPime (MAXIPIME) IV 2 g (07/29/23 1009)   diltiazem (CARDIZEM) infusion 15 mg/hr (07/29/23 1006)   heparin     PRN Meds:.diltiazem, metoprolol tartrate, traMADol  Antibiotics  :    Anti-infectives (From admission, onward)    Start     Dose/Rate Route Frequency Ordered Stop   07/29/23 0200  ceFEPIme (MAXIPIME) 2 g in sodium chloride 0.9 % 100 mL IVPB        2 g 200 mL/hr over 30 Minutes Intravenous Every 8 hours 07/28/23 2125     07/28/23 1830  vancomycin (VANCOCIN) IVPB 1000 mg/200 mL premix       Placed in "Followed by" Linked Group   1,000 mg 200 mL/hr over 60 Minutes Intravenous  Once 07/28/23 1719 07/28/23 2121   07/28/23 1730  ceFEPIme (MAXIPIME) 2 g in sodium chloride 0.9 % 100 mL IVPB        2 g 200 mL/hr over 30 Minutes Intravenous  Once 07/28/23 1719 07/28/23 1850   07/28/23 1730  vancomycin (VANCOCIN) IVPB 1000 mg/200 mL premix       Placed in "Followed by" Linked Group   1,000 mg 200 mL/hr over 60 Minutes Intravenous  Once 07/28/23 1719 07/28/23 2013         Objective:   Vitals:   07/29/23 0900 07/29/23 0926 07/29/23 1000 07/29/23 1100  BP:    124/81  Pulse: (!) 109 88 99 95  Resp: (!) 22 16 (!) 22 (!) 22  Temp:      TempSrc:      SpO2: 92% (!) 89% 93% 93%  Weight:      Height:        Wt  Readings from Last 3 Encounters:  07/29/23 77.7 kg  01/06/23 97.5 kg  01/31/22 104.3 kg     Intake/Output Summary (Last 24 hours) at 07/29/2023 1133 Last data filed at 07/29/2023 0600 Gross per 24 hour  Intake 656.85 ml  Output 1000 ml  Net -343.15 ml     Physical Exam  Awake Alert, No new F.N deficits, Normal affect [place].AT,PERRAL Supple Neck, No JVD,   Symmetrical Chest wall movement, Good air movement bilaterally, CTAB RRR,No Gallops,Rubs or new Murmurs,  +ve B.Sounds, Abd Soft, No tenderness,   Right BKA    Data Review:    Recent Labs  Lab 07/28/23 1548 07/29/23 0422  WBC 18.6* 18.7*  HGB 13.0 11.3*  HCT 40.9 35.7*  PLT 515* 391  MCV 83.1 83.2  MCH 26.4 26.3  MCHC 31.8 31.7  RDW 14.3 14.4  LYMPHSABS 1.6  --   MONOABS 1.3*  --   EOSABS 0.1  --   BASOSABS 0.1  --     Recent Labs  Lab 07/28/23 1548 07/28/23 1553 07/28/23 1801 07/28/23 2304 07/29/23 0422 07/29/23 0942  NA 132*  --   --   --  130*  --   K 4.5  --   --   --  4.5  --   CL 95*  --   --   --  97*  --   CO2 24  --   --   --  24  --   ANIONGAP 13  --   --   --  9  --   GLUCOSE 228*  --   --   --  189*  --   BUN 32*  --   --   --  30*  --   CREATININE 1.01  --   --   --  1.19  --   AST 21  --   --   --  15  --   ALT 13  --   --   --  11  --   ALKPHOS 95  --   --   --  85  --   BILITOT 0.9  --   --   --  0.6  --   ALBUMIN 2.2*  --   --   --  2.0*  --   CRP  --   --   --   --   --  18.1*  PROCALCITON  --   --   --   --   --  <0.10  LATICACIDVEN  --  3.7* 2.0* 1.6  --   --   TSH  --   --   --   --  1.533  --   HGBA1C  --   --   --  9.9*  --   --   BNP  --   --   --   --   --  250.4*  MG  --   --   --   --   --  1.9  CALCIUM 12.3*  --   --   --  12.3*  --       Recent Labs  Lab 07/28/23 1548 07/28/23 1553 07/28/23 1801 07/28/23 2304 07/29/23 0422 07/29/23 0942  CRP  --   --   --   --   --  18.1*  PROCALCITON  --   --   --   --   --  <0.10  LATICACIDVEN  --  3.7* 2.0* 1.6  --    --   TSH  --   --   --   --  1.533  --   HGBA1C  --   --   --  9.9*  --   --   BNP  --   --   --   --   --  250.4*  MG  --   --   --   --   --  1.9  CALCIUM 12.3*  --   --   --  12.3*  --     --------------------------------------------------------------------------------------------------------------- Lab Results  Component Value Date   CHOL 131 07/24/2021   HDL 28 (L) 07/24/2021   LDLCALC 48 07/24/2021   TRIG 273 (H) 07/24/2021   CHOLHDL 4.7 07/24/2021    Lab Results  Component Value Date   HGBA1C 9.9 (H) 07/28/2023   Recent Labs    07/29/23 0422  TSH 1.533   No results for input(s): "VITAMINB12", "FOLATE", "FERRITIN", "TIBC", "IRON", "RETICCTPCT" in the last 72 hours. ------------------------------------------------------------------------------------------------------------------ Cardiac Enzymes No results for input(s): "CKMB", "TROPONINI", "MYOGLOBIN" in the last 168 hours.  Invalid input(s): "CK"  Micro Results Recent Results (from the past 240 hours)  Blood culture (routine x 2)     Status: None (Preliminary result)   Collection Time: 07/28/23  3:48 PM   Specimen: BLOOD RIGHT ARM  Result Value Ref Range Status   Specimen Description BLOOD RIGHT ARM  Final   Special Requests   Final    BOTTLES DRAWN AEROBIC AND ANAEROBIC Blood Culture adequate volume   Culture   Final    NO GROWTH < 24 HOURS Performed at [facility], [address] [place], [place] [address]    Report Status PENDING  Incomplete  Blood culture (routine x 2)     Status: None (Preliminary result)   Collection Time: 07/28/23  3:48 PM   Specimen: BLOOD LEFT ARM  Result Value Ref Range Status   Specimen Description BLOOD LEFT ARM  Final   Special Requests   Final    BOTTLES DRAWN AEROBIC AND ANAEROBIC Blood Culture adequate volume   Culture   Final    NO GROWTH < 24 HOURS Performed at [facility], [address] [place], [place] [address]    Report Status PENDING  Incomplete     Radiology Reports CT Angio Chest Pulmonary Embolism (PE) W or WO Contrast Result Date: 07/29/2023 CLINICAL DATA:  Pulmonary embolism suspected. Positive D-dimer. CT yesterday demonstrating a large left upper lobe neoplasm with mediastinal and hilar adenopathy and bronchovascular encasement. EXAM: CT ANGIOGRAPHY CHEST WITH CONTRAST TECHNIQUE: Multidetector CT imaging of the chest was performed using the standard protocol during bolus administration of intravenous contrast. Multiplanar CT image reconstructions and MIPs were obtained to evaluate the vascular anatomy. RADIATION DOSE REDUCTION: This exam was performed according to the departmental dose-optimization program which includes automated exposure control, adjustment  of the mA and/or kV according to patient size and/or use of iterative reconstruction technique. CONTRAST:  75mL OMNIPAQUE IOHEXOL 350 MG/ML SOLN COMPARISON:  CT chest, abdomen and pelvis with IV contrast yesterday at 5:28 p.m. (current exam 07/29/2023 at 12:08 a.m.). FINDINGS: Cardiovascular: There is a small embolic burden in the right lower lobe with nonocclusive thrombus within a posterior basal subsegmental branch likely extending into 2 of its downstream small branches. There is no evidence of right heart strain and no further embolus is seen. Bronchovascular tumor encasement narrows the distal left main pulmonary artery and it appears to almost completely occlude the left upper lobe pulmonary artery, and occludes the left superior pulmonary vein. Pulmonary trunk is upper limits of normal in caliber. Cardiac size is normal. There is a small pericardial effusion. There is no venous dilatation. Aortic and three-vessel coronary artery atherosclerosis is present but no aneurysm, stenosis or dissection, with scattered plaque in the great vessels. Mediastinum/Nodes: Large left upper lobe mass encases and obscures the hilar structures for the most part. There is undoubtedly left hilar  adenopathy but it is difficult to separate from the mass. Multifocal mediastinal adenopathy is seen with subcarinal nodal mass 4.3 x 2.6 cm on 5:74, right paratracheal adenopathy with multiple lymph nodes enlarged for example measuring 2 x 1.9 cm on 5:48, enlarged right hilar nodes measuring as much as 2.4 x 3.1 cm on 5:71, and left axillary adenopathy with the largest of these lymph nodes measuring 2.3 x 2.0 cm on 5:53. The trachea and main bronchi are patent. Unremarkable thoracic esophagus. Lungs/Pleura: Large heterogeneous left upper lobe mass obstructs the left upper lobe bronchus, with no visible remaining aeration in the left upper lobe. Scattered foci of air in the apical portion of the mass could indicate tumoral necrosis or residual air bronchograms versus infectious complication. In total the mass measures 12.7 cm AP on 5:53, 9 cm transverse on 5:44, and 14.6 cm in height. Small layering left and minimal layering right pleural effusions are also again noted as well as multiple right lung nodules consistent with metastases. There is bilateral bronchial thickening but it is proportionately greater in the left lower lobe where there is peribronchovascular interstitial and airspace opacities which could indicate changes of bronchopneumonia or lymphangitic carcinomatosis. Multiple micronodules are noted in this distribution. Examples of some of the larger right lung metastases include a 1.1 cm nodule in the right upper lobe on 6:46, a right lower lobe superior segment nodule measuring 1.4 cm on 6:51, and a 1.3 cm right lower lobe nodule on 6:119. Others are smaller. There are mild centrilobular and paraseptal emphysematous changes in the right upper lobe. Upper Abdomen: No upper abdominal metastasis is seen. Musculoskeletal: Thoracic spondylosis. Partially visible lower cervical ACDF plating. Multilevel thoracic degenerative disc disease. No destructive bone lesions. Review of the MIP images confirms the above  findings. IMPRESSION: 1. Small subsegmental embolic burden in the right lower lobe, with no evidence of right heart strain. 2. Large 12.7 x 9 x 14.6 cm left upper lobe mass with bronchovascular encasement, occluding the left upper lobe bronchus and almost completely occluding the left upper lobe pulmonary artery, and occluding the left superior pulmonary vein. 3. Multiple right lung nodules consistent with metastases. 4. Bilateral bronchial thickening but proportionately greater in the left lower lobe where there is peribronchovascular interstitial and airspace opacities which could indicate changes of bronchopneumonia or lymphangitic carcinomatosis. 5. Small left and minimal right pleural effusions. 6. Multifocal mediastinal, right hilar and left axillary  adenopathy. 7. Aortic and coronary artery atherosclerosis. 8. Emphysema. 9. These results will be called to the ordering clinician or representative by the Radiologist Assistant, and communication documented in the PACS or [employer]. Aortic Atherosclerosis (ICD10-I70.0) and Emphysema (ICD10-J43.9). Electronically Signed   By: Almira Bar M.D.   On: 07/29/2023 00:42   MR Lumbar Spine W Wo Contrast Result Date: 07/28/2023 CLINICAL DATA:  Initial evaluation for acute low back pain EXAM: MRI LUMBAR SPINE WITHOUT AND WITH CONTRAST TECHNIQUE: Multiplanar and multiecho pulse sequences of the lumbar spine were obtained without and with intravenous contrast. CONTRAST:  10mL GADAVIST GADOBUTROL 1 MMOL/ML IV SOLN COMPARISON:  Prior CT from earlier the same day. FINDINGS: Segmentation: Standard. Lowest well-formed disc space labeled the L5-S1 level. Alignment: 2 mm degenerative retrolisthesis of L1 on L2 and L2 on L3, with 3 mm anterolisthesis of L5 on S1. Vertebrae: Abnormal marrow replacement with edema and heterogeneous enhancement seen within the L1 vertebral body, concerning for osseous metastatic disease. Associated mild pathologic compression fracture  measures up to 20% without bony retropulsion. No extra osseous or epidural extension of tumor. No other visible metastatic lesions within the lumbar spine. Bone marrow signal intensity otherwise normal. Prominent degenerative reactive endplate changes noted about the L2-3 interspace. No other abnormal marrow edema or enhancement. Conus medullaris and cauda equina: Conus extends to the T12-L1 no abnormal enhancement. Level. Conus and cauda equina appear normal. Paraspinal and other soft tissues: Changes of acute pyelonephritis involving the left greater than right kidneys noted, described on prior CT. 5.3 cm simple right renal cyst, benign in appearance, no follow-up imaging recommended. Disc levels: T12-L1: Mild diffuse disc bulge with reactive endplate spurring. Mild facet hypertrophy. No spinal stenosis. Foramina remain patent. L1-2: Degenerative intervertebral disc space narrowing with diffuse disc bulge and disc desiccation. Reactive endplate spurring. Mild bilateral facet hypertrophy. Resultant mild narrowing of the lateral recesses bilaterally. Central canal remains patent. Foramina remain adequately patent bilaterally. L2-3: Advanced degenerative intervertebral disc space narrowing with diffuse disc bulge and disc desiccation. Prominent reactive endplate change with marginal endplate osteophytic spurring and marrow edema. Mild to moderate bilateral facet arthrosis. Resultant moderate canal with bilateral subarticular stenosis. Moderate left worse than right L2 foraminal narrowing. L3-4: Diffuse disc bulge with reactive endplate spurring. Superimposed small left foraminal disc protrusion contacts the exiting left L3 nerve root (series 9, image 12). Mild-to-moderate bilateral facet hypertrophy. Resultant mild narrowing of the lateral recesses bilaterally. Central canal remains patent. Mild to moderate left L3 foraminal stenosis. Right neural foramen remains patent. L4-5: Disc desiccation with diffuse disc  bulge. Reactive endplate spurring. Moderate to severe bilateral facet arthrosis with associated small reactive joint effusions. Resultant mild to moderate bilateral subarticular stenosis. Central canal remains patent. Moderate to severe bilateral L4 foraminal narrowing. L5-S1: Anterolisthesis with degenerative intervertebral disc space narrowing. Diffuse disc bulge with reactive endplate spurring. Moderate bilateral facet hypertrophy. No significant spinal stenosis. Moderate bilateral L5 foraminal narrowing. IMPRESSION: 1. Abnormal marrow replacement with heterogeneous enhancement within the L1 vertebral body, concerning for osseous metastatic disease. Associated mild pathologic compression fracture measures up to 20% without bony retropulsion. No extra osseous or epidural extension of tumor. 2. No other evidence for metastatic disease within the lumbar spine. 3. Multilevel lumbar spondylosis with resultant moderate canal stenosis at L2-3. Associated multilevel foraminal narrowing as above, moderate to severe in nature at L4-5 bilaterally. 4. Changes of acute pyelonephritis involving the left greater than right kidneys, described on prior CT. Electronically Signed   By: Sharlet Salina  Phill Myron M.D.   On: 07/28/2023 23:59   MR BRAIN W WO CONTRAST Result Date: 07/28/2023 CLINICAL DATA:  Initial evaluation for brain/CNS neoplasm EXAM: MRI HEAD WITHOUT AND WITH CONTRAST TECHNIQUE: Multiplanar, multiecho pulse sequences of the brain and surrounding structures were obtained without and with intravenous contrast. CONTRAST:  10mL GADAVIST GADOBUTROL 1 MMOL/ML IV SOLN COMPARISON:  Prior CT from 07/28/2023. FINDINGS: Brain: Cerebral volume within normal limits. Patchy T2/FLAIR hyperintensity involving the periventricular and deep white matter both supra hemispheres, consistent with chronic small vessel ischemic disease, moderately advanced in nature. Punctate acute ischemic nonhemorrhagic infarct present at the right  parietal cortex (series 9, image 98). No other evidence for acute or subacute ischemia. Mixed cystic and solid peripherally enhancing mass positioned at the left occipital lobe is seen, measuring 4.0 x 3.5 x 3.7 cm. Mild surrounding vasogenic edema without significant regional mass effect or midline shift. 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 No other visible mass lesions or abnormal enhancement. No hydrocephalus or extra-axial fluid collection. Pituitary gland suprasellar region within normal limits. Vascular: Major intracranial vascular flow voids are maintained. Skull and upper cervical spine: Craniocervical junction within normal limits. Visualized bone marrow signal intensity within normal limits. Postoperative changes noted within the partially visualized upper cervical spine. Sinuses/Orbits: Globes and orbital soft tissues within normal limits. Paranasal sinuses are largely clear. No significant mastoid effusion. Other: None. IMPRESSION: 1. 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 Mild surrounding vasogenic edema without significant regional mass effect or midline shift. 2. 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 3. Underlying moderately advanced chronic microvascular ischemic disease. Electronically Signed   By: Rise Mu M.D.   On: 07/28/2023 23:48   CT L-SPINE NO CHARGE Result Date: 07/28/2023 CLINICAL DATA:  Back pain after fall December 1st EXAM: CT LUMBAR SPINE WITHOUT CONTRAST TECHNIQUE: Multidetector CT imaging of the lumbar spine was performed without intravenous contrast administration. Multiplanar CT image reconstructions were also generated. RADIATION DOSE REDUCTION: This exam was performed according to the departmental dose-optimization program which includes  automated exposure control, adjustment of the mA and/or kV according to patient size and/or use of iterative reconstruction technique. COMPARISON:  CT 04/05/2016 FINDINGS: Segmentation: 5 lumbar type vertebrae. Alignment: No evidence of traumatic listhesis. Mild grade 1 retrolisthesis of L2 is likely chronic. Vertebrae: Mild superior endplate compression of L1, may be chronic though is age indeterminate without recent comparison. Otherwise no evidence of acute fracture. There is mild heterogeneity of the marrow of L1 and metastasis is not excluded. Consider MRI for further evaluation. Paraspinal and other soft tissues: See separate report for findings in the abdomen and pelvis. Disc levels: Multilevel spondylosis and disc space height loss with degenerative endplate changes greatest at L2-L3 where it is advanced. Mild multilevel facet arthropathy. Spinal canal narrowing is greatest at L2-L3 where it is moderate. IMPRESSION: 1. Mild superior endplate compression of L1 may be chronic though is age indeterminate without recent comparison. 2. Heterogeneity of the marrow of L1. This may be related to chronic changes however metastasis is not excluded. Consider MRI for further evaluation. \ 3. Advanced disc space height loss at L2-L3. 4. See separate report for findings in the chest, abdomen, and pelvis. Electronically Signed   By: Minerva Fester M.D.   On: 07/28/2023 18:50   CT CHEST ABDOMEN PELVIS W  CONTRAST Result Date: 07/28/2023 CLINICAL DATA:  Altered mental status for 1 week and back pain after a fall on December 1st. EXAM: CT CHEST, ABDOMEN, AND PELVIS WITH CONTRAST TECHNIQUE: Multidetector CT imaging of the chest, abdomen and pelvis was performed following the standard protocol during bolus administration of intravenous contrast. RADIATION DOSE REDUCTION: This exam was performed according to the departmental dose-optimization program which includes automated exposure control, adjustment of the mA and/or kV  according to patient size and/or use of iterative reconstruction technique. CONTRAST:  75mL OMNIPAQUE IOHEXOL 350 MG/ML SOLN COMPARISON:  CT abdomen and pelvis 01/14/2008 and chest radiographs 12/07/2014 FINDINGS: CT CHEST FINDINGS Cardiovascular: Normal heart size. Small pericardial effusion. Pericardial thickening where it abuts the large left suprahilar mass. Coronary artery and aortic atherosclerotic calcification. The left upper lobe pulmonary arteries are not well visualized and may be compressed or occluded by the mass. Mediastinum/Nodes: Large heterogenous mass in the left suprahilar region extending into the left upper lobe. Mediastinal, bilateral hilar, and left axillary lymphadenopathy. For example a subcarinal node measures 2.5 cm (series 3/image 27); 1.3 cm right hilar node on series 3/image 34; and a left axillary node measures 2.2 cm (3/19). Trachea and esophagus are unremarkable. Lungs/Pleura: Large heterogenous mass left upper lobe/suprahilar mass consistent with malignancy measuring 12.1 x 8.7 by 12.1 cm. There are a few small locules of gas in the superior portion of the mass which may represent cavitation. The left upper lobe bronchus is occluded by the mass. Diffuse bronchial wall thickening in the lingula and left lower lobe with centrilobular micro nodules and tree-in-bud opacities. 1.6 cm subpleural nodule in the anterior lingula (series 4/image 90). Small left pleural effusion. Multiple pulmonary nodules in the right lung consistent with metastases. For example 12 mm nodule in the posterior right lower lobe (4/128) and 9 mm nodule in the right upper lobe (4/44). Mild emphysema in the right. No right pleural effusion. No pneumothorax. Musculoskeletal: No acute fracture.  No destructive osseous lesion. CT ABDOMEN PELVIS FINDINGS Hepatobiliary: No focal hepatic lesion. Layering sludge in the gallbladder. No biliary dilation or evidence of cholecystitis. Pancreas: Unremarkable. Spleen:  Unremarkable. Adrenals/Urinary Tract: Normal adrenal glands. Heterogenous attenuation of the left kidney. Focal geographic hypoattenuation in the posterior right kidney. Mild asymmetric left-greater-than-right perinephric stranding. Findings are compatible with pyelonephritis. No urinary calculi or hydronephrosis. Unremarkable bladder. Stomach/Bowel: Normal caliber large and small bowel without bowel wall thickening. Stomach and appendix are within normal limits. Colonic diverticulosis without diverticulitis. Vascular/Lymphatic: Aortic atherosclerosis. 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 The stent in the right superficial femoral artery is not definitively opacified and may be occluded. No enlarged abdominal or pelvic lymph nodes. Reproductive: No acute abnormality. Other: No free intraperitoneal fluid or air. Musculoskeletal: No acute fracture or destructive osseous lesion in the pelvis. See separate report for findings in the lumbar spine. IMPRESSION: CHEST: 1. Large heterogenous mass in the left upper lobe/suprahilar region consistent with malignancy. 2. Diffuse bronchial wall thickening in the lingula and left lower lobe. Differential considerations include lymphangitic spread of tumor or airway infection/inflammation. 3. Multiple pulmonary nodules in the right lung consistent with metastases. 4. Mediastinal, bilateral hilar, and left axillary lymphadenopathy consistent with metastases. 5. Small left pleural effusion. ABDOMEN/PELVIS: 1. Bilateral pyelonephritis, left-greater-than-right. 2. The stent in the right superficial femoral artery is not definitively opacified and may be occluded though this is incompletely evaluated on non arterial phase exam. 3. 2.5  cm right common iliac artery aneurysm. 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 Aortic Atherosclerosis  (ICD10-I70.0) and Emphysema (ICD10-J43.9). Electronically Signed   By: Minerva Fester M.D.   On: 07/28/2023 18:16   CT Head Wo Contrast Result Date: 07/28/2023 CLINICAL DATA:  Delirium EXAM: CT HEAD WITHOUT CONTRAST TECHNIQUE: Contiguous axial images were obtained from the base of the skull through the vertex without intravenous contrast. RADIATION DOSE REDUCTION: This exam was performed according to the departmental dose-optimization program which includes automated exposure control, adjustment of the mA and/or kV according to patient size and/or use of iterative reconstruction technique. COMPARISON:  CT of the head December 07, 2014. FINDINGS: Brain: Complex cystic and solid 3.4 cm mass in the left parietal lobe. This lesion has some mild hyperdensity associated with it which could represent mineralization and or hemorrhage. Surrounding edema and mild regional mass effect without midline shift. No evidence of acute large vascular territory infarct, mass occupying acute hemorrhage outside the above lesion, midline shift or hydrocephalus. Vascular: No hyperdense vessel identified. Skull: Normal. Negative for fracture or focal lesion. Sinuses/Orbits: Mostly clear sinuses.  No acute orbital findings. IMPRESSION: 1. Cystic and solid 3.4 cm mass in the left parietal lobe, which is concerning for a metastasis given the findings on the forthcoming CT of the chest/abdomen/pelvis. Recommend MRI of the head with contrast to further characterize and to assess for other lesions. 2. Surrounding edema and mild regional mass effect without midline shift. Electronically Signed   By: Feliberto Harts M.D.   On: 07/28/2023 18:02      Signature  -   Susa Raring M.D on 07/29/2023 at 11:33 AM   -  To page go to [email or web address]

## 2023-07-29 NOTE — Progress Notes (Addendum)
STROKE TEAM PROGRESS NOTE   BRIEF HPI Cody Pena is a 62 y.o. male with history of arthritis, depression, diabetes, dyspnea, hyperlipidemia, hypertension, peripheral vascular disease and right BKA presenting with altered mental status and back pain.  He was found to have a large left lung mass, L1 vertebral mass and cystic and solid peripherally enhancing mass at left occipital lobe, concerning for metastasis.  He was also found to have a punctate acute infarct in the right parietal lobe on MRI but has no clinical symptoms related to this.  INTERIM HISTORY/SUBJECTIVE Patient has been hemodynamically stable and afebrile overnight.  He is alert and oriented x 3 on exam today and able to understand information about his medical condition.  He is awaiting biopsy of his left lung mass, will be okay to start anticoagulation after this is done.   OBJECTIVE  CBC    Component Value Date/Time   WBC 18.7 (H) 07/29/2023 0422   RBC 4.29 07/29/2023 0422   HGB 11.3 (L) 07/29/2023 0422   HCT 35.7 (L) 07/29/2023 0422   PLT 391 07/29/2023 0422   MCV 83.2 07/29/2023 0422   MCH 26.3 07/29/2023 0422   MCHC 31.7 07/29/2023 0422   RDW 14.4 07/29/2023 0422   LYMPHSABS 1.6 07/28/2023 1548   MONOABS 1.3 (H) 07/28/2023 1548   EOSABS 0.1 07/28/2023 1548   BASOSABS 0.1 07/28/2023 1548    BMET    Component Value Date/Time   NA 130 (L) 07/29/2023 0422   K 4.5 07/29/2023 0422   CL 97 (L) 07/29/2023 0422   CO2 24 07/29/2023 0422   GLUCOSE 189 (H) 07/29/2023 0422   BUN 30 (H) 07/29/2023 0422   CREATININE 1.19 07/29/2023 0422   CALCIUM 12.3 (H) 07/29/2023 0422   GFRNONAA >60 07/29/2023 0422    IMAGING past 24 hours EEG adult Result Date: 07/29/2023 Charlsie Quest, MD     07/29/2023 11:52 AM Patient Name: JAVEION CHIMA MRN: [id number] Epilepsy Attending: Charlsie Quest Referring Physician/Provider: Caryl Pina, MD Date: 07/29/2023 Duration: 25.56 mins Patient history: 62 yo M presented to  the ED yesterday afternoon with a one week history of AMS and back pain. EEG to evaluate for seizure Level of alertness: Awake AEDs during EEG study: None Technical aspects: This EEG study was done with scalp electrodes positioned according to the 10-20 International system of electrode placement. Electrical activity was reviewed with band pass filter of 1-70Hz , sensitivity of 7 uV/mm, display speed of 49mm/sec with a 60Hz  notched filter applied as appropriate. EEG data were recorded continuously and digitally stored.  Video monitoring was available and reviewed as appropriate. Description: No posterior dominant rhythm was seen. EEG showed continuous generalized 3 to 6 Hz theta-delta slowing admixed with 12-14 Hz beta activity distributed symmetrically and diffusely.  Physiologic photic driving was not seen during photic stimulation.  Hyperventilation was not performed.   ABNORMALITY - Continuous slow, generalized IMPRESSION: This study is suggestive of moderate diffuse encephalopathy. No seizures or epileptiform discharges were seen throughout the recording. Charlsie Quest    CT Angio Chest Pulmonary Embolism (PE) W or WO Contrast Result Date: 07/29/2023 CLINICAL DATA:  Pulmonary embolism suspected. Positive D-dimer. CT yesterday demonstrating a large left upper lobe neoplasm with mediastinal and hilar adenopathy and bronchovascular encasement. EXAM: CT ANGIOGRAPHY CHEST WITH CONTRAST TECHNIQUE: Multidetector CT imaging of the chest was performed using the standard protocol during bolus administration of intravenous contrast. Multiplanar CT image reconstructions and MIPs were obtained to evaluate the vascular  anatomy. RADIATION DOSE REDUCTION: This exam was performed according to the departmental dose-optimization program which includes automated exposure control, adjustment of the mA and/or kV according to patient size and/or use of iterative reconstruction technique. CONTRAST:  75mL OMNIPAQUE IOHEXOL 350  MG/ML SOLN COMPARISON:  CT chest, abdomen and pelvis with IV contrast yesterday at 5:28 p.m. (current exam 07/29/2023 at 12:08 a.m.). FINDINGS: Cardiovascular: There is a small embolic burden in the right lower lobe with nonocclusive thrombus within a posterior basal subsegmental branch likely extending into 2 of its downstream small branches. There is no evidence of right heart strain and no further embolus is seen. Bronchovascular tumor encasement narrows the distal left main pulmonary artery and it appears to almost completely occlude the left upper lobe pulmonary artery, and occludes the left superior pulmonary vein. Pulmonary trunk is upper limits of normal in caliber. Cardiac size is normal. There is a small pericardial effusion. There is no venous dilatation. Aortic and three-vessel coronary artery atherosclerosis is present but no aneurysm, stenosis or dissection, with scattered plaque in the great vessels. Mediastinum/Nodes: Large left upper lobe mass encases and obscures the hilar structures for the most part. There is undoubtedly left hilar adenopathy but it is difficult to separate from the mass. Multifocal mediastinal adenopathy is seen with subcarinal nodal mass 4.3 x 2.6 cm on 5:74, right paratracheal adenopathy with multiple lymph nodes enlarged for example measuring 2 x 1.9 cm on 5:48, enlarged right hilar nodes measuring as much as 2.4 x 3.1 cm on 5:71, and left axillary adenopathy with the largest of these lymph nodes measuring 2.3 x 2.0 cm on 5:53. The trachea and main bronchi are patent. Unremarkable thoracic esophagus. Lungs/Pleura: Large heterogeneous left upper lobe mass obstructs the left upper lobe bronchus, with no visible remaining aeration in the left upper lobe. Scattered foci of air in the apical portion of the mass could indicate tumoral necrosis or residual air bronchograms versus infectious complication. In total the mass measures 12.7 cm AP on 5:53, 9 cm transverse on 5:44, and  14.6 cm in height. Small layering left and minimal layering right pleural effusions are also again noted as well as multiple right lung nodules consistent with metastases. There is bilateral bronchial thickening but it is proportionately greater in the left lower lobe where there is peribronchovascular interstitial and airspace opacities which could indicate changes of bronchopneumonia or lymphangitic carcinomatosis. Multiple micronodules are noted in this distribution. Examples of some of the larger right lung metastases include a 1.1 cm nodule in the right upper lobe on 6:46, a right lower lobe superior segment nodule measuring 1.4 cm on 6:51, and a 1.3 cm right lower lobe nodule on 6:119. Others are smaller. There are mild centrilobular and paraseptal emphysematous changes in the right upper lobe. Upper Abdomen: No upper abdominal metastasis is seen. Musculoskeletal: Thoracic spondylosis. Partially visible lower cervical ACDF plating. Multilevel thoracic degenerative disc disease. No destructive bone lesions. Review of the MIP images confirms the above findings. IMPRESSION: 1. Small subsegmental embolic burden in the right lower lobe, with no evidence of right heart strain. 2. Large 12.7 x 9 x 14.6 cm left upper lobe mass with bronchovascular encasement, occluding the left upper lobe bronchus and almost completely occluding the left upper lobe pulmonary artery, and occluding the left superior pulmonary vein. 3. Multiple right lung nodules consistent with metastases. 4. Bilateral bronchial thickening but proportionately greater in the left lower lobe where there is peribronchovascular interstitial and airspace opacities which could indicate changes of  bronchopneumonia or lymphangitic carcinomatosis. 5. Small left and minimal right pleural effusions. 6. Multifocal mediastinal, right hilar and left axillary adenopathy. 7. Aortic and coronary artery atherosclerosis. 8. Emphysema. 9. These results will be called to  the ordering clinician or representative by the Radiologist Assistant, and communication documented in the PACS or [employer]. Aortic Atherosclerosis (ICD10-I70.0) and Emphysema (ICD10-J43.9). Electronically Signed   By: Almira Bar M.D.   On: 07/29/2023 00:42   MR Lumbar Spine W Wo Contrast Result Date: 07/28/2023 CLINICAL DATA:  Initial evaluation for acute low back pain EXAM: MRI LUMBAR SPINE WITHOUT AND WITH CONTRAST TECHNIQUE: Multiplanar and multiecho pulse sequences of the lumbar spine were obtained without and with intravenous contrast. CONTRAST:  10mL GADAVIST GADOBUTROL 1 MMOL/ML IV SOLN COMPARISON:  Prior CT from earlier the same day. FINDINGS: Segmentation: Standard. Lowest well-formed disc space labeled the L5-S1 level. Alignment: 2 mm degenerative retrolisthesis of L1 on L2 and L2 on L3, with 3 mm anterolisthesis of L5 on S1. Vertebrae: Abnormal marrow replacement with edema and heterogeneous enhancement seen within the L1 vertebral body, concerning for osseous metastatic disease. Associated mild pathologic compression fracture measures up to 20% without bony retropulsion. No extra osseous or epidural extension of tumor. No other visible metastatic lesions within the lumbar spine. Bone marrow signal intensity otherwise normal. Prominent degenerative reactive endplate changes noted about the L2-3 interspace. No other abnormal marrow edema or enhancement. Conus medullaris and cauda equina: Conus extends to the T12-L1 no abnormal enhancement. Level. Conus and cauda equina appear normal. Paraspinal and other soft tissues: Changes of acute pyelonephritis involving the left greater than right kidneys noted, described on prior CT. 5.3 cm simple right renal cyst, benign in appearance, no follow-up imaging recommended. Disc levels: T12-L1: Mild diffuse disc bulge with reactive endplate spurring. Mild facet hypertrophy. No spinal stenosis. Foramina remain patent. L1-2: Degenerative intervertebral  disc space narrowing with diffuse disc bulge and disc desiccation. Reactive endplate spurring. Mild bilateral facet hypertrophy. Resultant mild narrowing of the lateral recesses bilaterally. Central canal remains patent. Foramina remain adequately patent bilaterally. L2-3: Advanced degenerative intervertebral disc space narrowing with diffuse disc bulge and disc desiccation. Prominent reactive endplate change with marginal endplate osteophytic spurring and marrow edema. Mild to moderate bilateral facet arthrosis. Resultant moderate canal with bilateral subarticular stenosis. Moderate left worse than right L2 foraminal narrowing. L3-4: Diffuse disc bulge with reactive endplate spurring. Superimposed small left foraminal disc protrusion contacts the exiting left L3 nerve root (series 9, image 12). Mild-to-moderate bilateral facet hypertrophy. Resultant mild narrowing of the lateral recesses bilaterally. Central canal remains patent. Mild to moderate left L3 foraminal stenosis. Right neural foramen remains patent. L4-5: Disc desiccation with diffuse disc bulge. Reactive endplate spurring. Moderate to severe bilateral facet arthrosis with associated small reactive joint effusions. Resultant mild to moderate bilateral subarticular stenosis. Central canal remains patent. Moderate to severe bilateral L4 foraminal narrowing. L5-S1: Anterolisthesis with degenerative intervertebral disc space narrowing. Diffuse disc bulge with reactive endplate spurring. Moderate bilateral facet hypertrophy. No significant spinal stenosis. Moderate bilateral L5 foraminal narrowing. IMPRESSION: 1. Abnormal marrow replacement with heterogeneous enhancement within the L1 vertebral body, concerning for osseous metastatic disease. Associated mild pathologic compression fracture measures up to 20% without bony retropulsion. No extra osseous or epidural extension of tumor. 2. No other evidence for metastatic disease within the lumbar spine. 3.  Multilevel lumbar spondylosis with resultant moderate canal stenosis at L2-3. Associated multilevel foraminal narrowing as above, moderate to severe in nature at L4-5 bilaterally. 4. Changes  of acute pyelonephritis involving the left greater than right kidneys, described on prior CT. Electronically Signed   By: Rise Mu M.D.   On: 07/28/2023 23:59   MR BRAIN W WO CONTRAST Result Date: 07/28/2023 CLINICAL DATA:  Initial evaluation for brain/CNS neoplasm EXAM: MRI HEAD WITHOUT AND WITH CONTRAST TECHNIQUE: Multiplanar, multiecho pulse sequences of the brain and surrounding structures were obtained without and with intravenous contrast. CONTRAST:  10mL GADAVIST GADOBUTROL 1 MMOL/ML IV SOLN COMPARISON:  Prior CT from 07/28/2023. FINDINGS: Brain: Cerebral volume within normal limits. Patchy T2/FLAIR hyperintensity involving the periventricular and deep white matter both supra hemispheres, consistent with chronic small vessel ischemic disease, moderately advanced in nature. Punctate acute ischemic nonhemorrhagic infarct present at the right parietal cortex (series 9, image 98). No other evidence for acute or subacute ischemia. Mixed cystic and solid peripherally enhancing mass positioned at the left occipital lobe is seen, measuring 4.0 x 3.5 x 3.7 cm. Mild surrounding vasogenic edema without significant regional mass effect or midline shift. 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 No other visible mass lesions or abnormal enhancement. No hydrocephalus or extra-axial fluid collection. Pituitary gland suprasellar region within normal limits. Vascular: Major intracranial vascular flow voids are maintained. Skull and upper cervical spine: Craniocervical junction within normal limits. Visualized bone marrow signal intensity within normal limits. Postoperative changes noted within the partially visualized upper cervical spine. Sinuses/Orbits:  Globes and orbital soft tissues within normal limits. Paranasal sinuses are largely clear. No significant mastoid effusion. Other: None. IMPRESSION: 1. 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 Mild surrounding vasogenic edema without significant regional mass effect or midline shift. 2. 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 3. Underlying moderately advanced chronic microvascular ischemic disease. Electronically Signed   By: Rise Mu M.D.   On: 07/28/2023 23:48   CT L-SPINE NO CHARGE Result Date: 07/28/2023 CLINICAL DATA:  Back pain after fall December 1st EXAM: CT LUMBAR SPINE WITHOUT CONTRAST TECHNIQUE: Multidetector CT imaging of the lumbar spine was performed without intravenous contrast administration. Multiplanar CT image reconstructions were also generated. RADIATION DOSE REDUCTION: This exam was performed according to the departmental dose-optimization program which includes automated exposure control, adjustment of the mA and/or kV according to patient size and/or use of iterative reconstruction technique. COMPARISON:  CT 04/05/2016 FINDINGS: Segmentation: 5 lumbar type vertebrae. Alignment: No evidence of traumatic listhesis. Mild grade 1 retrolisthesis of L2 is likely chronic. Vertebrae: Mild superior endplate compression of L1, may be chronic though is age indeterminate without recent comparison. Otherwise no evidence of acute fracture. There is mild heterogeneity of the marrow of L1 and metastasis is not excluded. Consider MRI for further evaluation. Paraspinal and other soft tissues: See separate report for findings in the abdomen and pelvis. Disc levels: Multilevel spondylosis and disc space height loss with degenerative endplate changes greatest at L2-L3 where it is advanced. Mild multilevel facet arthropathy. Spinal canal narrowing is  greatest at L2-L3 where it is moderate. IMPRESSION: 1. Mild superior endplate compression of L1 may be chronic though is age indeterminate without recent comparison. 2. Heterogeneity of the marrow of L1. This may be related to chronic changes however metastasis is not excluded. Consider MRI for further evaluation. \ 3. Advanced disc space height loss at L2-L3. 4. See separate report for findings in the chest, abdomen, and pelvis.  Electronically Signed   By: Minerva Fester M.D.   On: 07/28/2023 18:50   CT CHEST ABDOMEN PELVIS W CONTRAST Result Date: 07/28/2023 CLINICAL DATA:  Altered mental status for 1 week and back pain after a fall on December 1st. EXAM: CT CHEST, ABDOMEN, AND PELVIS WITH CONTRAST TECHNIQUE: Multidetector CT imaging of the chest, abdomen and pelvis was performed following the standard protocol during bolus administration of intravenous contrast. RADIATION DOSE REDUCTION: This exam was performed according to the departmental dose-optimization program which includes automated exposure control, adjustment of the mA and/or kV according to patient size and/or use of iterative reconstruction technique. CONTRAST:  75mL OMNIPAQUE IOHEXOL 350 MG/ML SOLN COMPARISON:  CT abdomen and pelvis 01/14/2008 and chest radiographs 12/07/2014 FINDINGS: CT CHEST FINDINGS Cardiovascular: Normal heart size. Small pericardial effusion. Pericardial thickening where it abuts the large left suprahilar mass. Coronary artery and aortic atherosclerotic calcification. The left upper lobe pulmonary arteries are not well visualized and may be compressed or occluded by the mass. Mediastinum/Nodes: Large heterogenous mass in the left suprahilar region extending into the left upper lobe. Mediastinal, bilateral hilar, and left axillary lymphadenopathy. For example a subcarinal node measures 2.5 cm (series 3/image 27); 1.3 cm right hilar node on series 3/image 34; and a left axillary node measures 2.2 cm (3/19). Trachea and  esophagus are unremarkable. Lungs/Pleura: Large heterogenous mass left upper lobe/suprahilar mass consistent with malignancy measuring 12.1 x 8.7 by 12.1 cm. There are a few small locules of gas in the superior portion of the mass which may represent cavitation. The left upper lobe bronchus is occluded by the mass. Diffuse bronchial wall thickening in the lingula and left lower lobe with centrilobular micro nodules and tree-in-bud opacities. 1.6 cm subpleural nodule in the anterior lingula (series 4/image 90). Small left pleural effusion. Multiple pulmonary nodules in the right lung consistent with metastases. For example 12 mm nodule in the posterior right lower lobe (4/128) and 9 mm nodule in the right upper lobe (4/44). Mild emphysema in the right. No right pleural effusion. No pneumothorax. Musculoskeletal: No acute fracture.  No destructive osseous lesion. CT ABDOMEN PELVIS FINDINGS Hepatobiliary: No focal hepatic lesion. Layering sludge in the gallbladder. No biliary dilation or evidence of cholecystitis. Pancreas: Unremarkable. Spleen: Unremarkable. Adrenals/Urinary Tract: Normal adrenal glands. Heterogenous attenuation of the left kidney. Focal geographic hypoattenuation in the posterior right kidney. Mild asymmetric left-greater-than-right perinephric stranding. Findings are compatible with pyelonephritis. No urinary calculi or hydronephrosis. Unremarkable bladder. Stomach/Bowel: Normal caliber large and small bowel without bowel wall thickening. Stomach and appendix are within normal limits. Colonic diverticulosis without diverticulitis. Vascular/Lymphatic: Aortic atherosclerosis. 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 The stent in the right superficial femoral artery is not definitively opacified and may be occluded. No enlarged abdominal or pelvic lymph nodes. Reproductive: No acute abnormality. Other: No free  intraperitoneal fluid or air. Musculoskeletal: No acute fracture or destructive osseous lesion in the pelvis. See separate report for findings in the lumbar spine. IMPRESSION: CHEST: 1. Large heterogenous mass in the left upper lobe/suprahilar region consistent with malignancy. 2. Diffuse bronchial wall thickening in the lingula and left lower lobe. Differential considerations include lymphangitic spread of tumor or airway infection/inflammation. 3. Multiple pulmonary nodules in the right lung consistent with metastases. 4. Mediastinal, bilateral hilar, and left axillary lymphadenopathy consistent with metastases. 5. Small left pleural effusion. ABDOMEN/PELVIS: 1. Bilateral pyelonephritis, left-greater-than-right. 2. The stent in the right superficial femoral  artery is not definitively opacified and may be occluded though this is incompletely evaluated on non arterial phase exam. 3. 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 Aortic Atherosclerosis (ICD10-I70.0) and Emphysema (ICD10-J43.9). Electronically Signed   By: Minerva Fester M.D.   On: 07/28/2023 18:16   CT Head Wo Contrast Result Date: 07/28/2023 CLINICAL DATA:  Delirium EXAM: CT HEAD WITHOUT CONTRAST TECHNIQUE: Contiguous axial images were obtained from the base of the skull through the vertex without intravenous contrast. RADIATION DOSE REDUCTION: This exam was performed according to the departmental dose-optimization program which includes automated exposure control, adjustment of the mA and/or kV according to patient size and/or use of iterative reconstruction technique. COMPARISON:  CT of the head December 07, 2014. FINDINGS: Brain: Complex cystic and solid 3.4 cm mass in the left parietal lobe. This lesion has some mild hyperdensity associated with it which could represent mineralization and or hemorrhage. Surrounding edema and mild regional mass effect without  midline shift. No evidence of acute large vascular territory infarct, mass occupying acute hemorrhage outside the above lesion, midline shift or hydrocephalus. Vascular: No hyperdense vessel identified. Skull: Normal. Negative for fracture or focal lesion. Sinuses/Orbits: Mostly clear sinuses.  No acute orbital findings. IMPRESSION: 1. Cystic and solid 3.4 cm mass in the left parietal lobe, which is concerning for a metastasis given the findings on the forthcoming CT of the chest/abdomen/pelvis. Recommend MRI of the head with contrast to further characterize and to assess for other lesions. 2. Surrounding edema and mild regional mass effect without midline shift. Electronically Signed   By: Feliberto Harts M.D.   On: 07/28/2023 18:02    Vitals:   07/29/23 1000 07/29/23 1100 07/29/23 1200 07/29/23 1300  BP:  124/81 110/79 (!) 140/77  Pulse: 99 95 88 98  Resp: (!) 22 (!) 22 (!) 21 (!) 28  Temp:  98.6 F (37 C)    TempSrc:  Oral    SpO2: 93% 93% 92% (!) 88%  Weight:      Height:         PHYSICAL EXAM General:  Alert, well-nourished, well-developed middle-age Caucasian male in no acute distress Psych:  Mood and affect appropriate for situation CV: Regular rate and rhythm on monitor Respiratory:  Regular, unlabored respirations on room air GI: Abdomen soft and nontender   NEURO:  Mental Status: AA&Ox3 Speech/Language: speech is without dysarthria or aphasia.    Cranial Nerves:  II: PERRL.  III, IV, VI: EOMI. Eyelids elevate symmetrically.  VII: Face is symmetrical resting and smiling VIII: hearing intact to voice. IX, X: Phonation is normal.  XII: tongue is midline without fasciculations. Motor: 5/5 strength to all muscle groups tested.  Tone: is normal and bulk is normal Sensation- Intact to light touch bilaterally. 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 with mild surrounding vasogenic edema, punctate acute ischemic infarct at right parietal cortex 2D Echo pending LDL 48 HgbA1c 9.9 VTE prophylaxis -Fully anticoagulated with heparin No antithrombotic prior to admission, now on heparin IV  Therapy  recommendations:  Pending Disposition: Pending  Left upper lobe mass, concerning for cancer Patient has left upper lobe mass as well as multiple pulmonary nodules and right lung and mediastinal hilar and left axillary lymphadenopathy Patient also has L1 vertebral mass with compression fracture Awaiting biopsy of this mass Awaiting oncology recommendations  Pulmonary embolus Right lower lobe PE noted on CT angio of chest Continue anticoagulation with heparin  Atrial fibrillation Home Meds: None Continue telemetry monitoring Continue diltiazem 90 mg every 8 hours Begin anticoagulation with heparin  Hypertension Home meds: None Stable Maintain normotension  Hyperlipidemia Home meds: Atorvastatin 10 mg daily, resumed in hospital LDL 48, goal < 70 High intensity statin not indicated as LDL below goal Continue statin at discharge  Diabetes type II Uncontrolled Home meds: NovoLog 70/30 10 units twice daily, pioglitazone 15 mg daily HgbA1c 9.9, goal < 7.0 CBGs SSI Recommend close follow-up with PCP for better DM control  Tobacco Abuse Patient smokes 0.5 packs per day for 47 years Tobacco cessation advised  Other Stroke Risk Factors None   Other Active Problems UTI-continue cefepime per primary team  Hospital day # 1  Patient seen by NP with MD, MD to edit note is needed. Cortney E Ernestina Columbia , MSN, AGACNP-BC [facility] See Amion for schedule and pager information 07/29/2023 2:06 PM   STROKE MD NOTE :  I have personally obtained history,examined this patient, reviewed notes, independently viewed imaging studies, participated in medical decision making and plan of care.ROS completed by me personally and pertinent positives fully documented  I have made any additions or clarifications directly to the above note. Agree with note above.  Patient presented with subacute decreased appetite and generalized weakness and CT and MRI shows multicystic left parietal lesion likely metastasis from suspected primary lung cancer.  MRI also shows tiny right parietal punctate cortical diffusion hyperintensity likely is silent infarct which may be related from hypercoagulability from lung cancer.  This appears to be clinically asymptomatic.  Patient also has pulmonary embolism and will need anticoagulation with IV heparin which can be held for lung biopsy.  Discharged home on oral anticoagulation after the biopsy.  Maintain aggressive risk factor modification.  Long discussion patient and answered questions.  Discussed with Dr. Thedore Mins.  Greater than 50% time during this 50-minute visit was spent on counseling and coordination of care about his silent brain stroke.  As well as brain metastasis and suspected primary lung cancer discussion about plan of care and need for anticoagulation due to hypercoagulability.  Delia Heady, MD Medical Director [facility] Pager: [phone] 07/29/2023 2:49 PM  To contact Stroke Continuity provider, please refer to [email or web address]. After hours, contact General Neurology

## 2023-07-29 NOTE — Progress Notes (Signed)
PT Cancellation Note  Patient Details Name: CASHEL OSKEY MRN: [id number] DOB: May 16, 1961   Cancelled Treatment:    Reason Eval/Treat Not Completed: Pain limiting ability to participate. Patient is quite agitated this pm, reporting pain and difficult to re-direct. Will re-attempt PT evaluation tomorrow.    Katharin Schneider 07/29/2023, 2:40 PM

## 2023-07-29 NOTE — Progress Notes (Signed)
Tech attempted EEG. Nurse requested tech try again later since patient is about to be taken to CT. Next available tech will try again.

## 2023-07-29 NOTE — Progress Notes (Signed)
PHARMACY - ANTICOAGULATION CONSULT NOTE  Pharmacy Consult for heparin Indication:  new onset atrial fibrillation and new subsegmental pulmonary embolism  Allergies  Allergen Reactions   Ceftriaxone Itching, Nausea And Vomiting and Rash    Noted minutes after starting medication No rash noted with Ancef on 01-31-2022 during general anesthesia Has received cefepime and cefazolin without reaction    Patient Measurements: Height: 6\' 3"  (190.5 cm) Weight: 77.7 kg (171 lb 4.8 oz) IBW/kg (Calculated) : 84.5 Heparin Dosing Weight: 77 kg  Vital Signs: Temp: 98.6 F (37 C) (12/16 1100) Temp Source: Oral (12/16 1100) BP: 140/77 (12/16 1300) Pulse Rate: 98 (12/16 1300)  Labs: Recent Labs    07/28/23 1548 07/28/23 1756 07/29/23 0422 07/29/23 1737  HGB 13.0  --  11.3*  --   HCT 40.9  --  35.7*  --   PLT 515*  --  391  --   HEPARINUNFRC  --   --   --  <0.10*  CREATININE 1.01  --  1.19  --   TROPONINIHS 21* 24*  --   --     Estimated Creatinine Clearance: 68.9 mL/min (by C-G formula based on SCr of 1.19 mg/dL).   Medical History: Past Medical History:  Diagnosis Date   Arthritis    COVID    has had Covid 3 times, not sick with it   COVID    has had 3 times mild to no symptoms   Depression    Diabetes mellitus without complication (HCC)    pt states he has now been told that he is a type 1 diabetic.   Dyspnea    albuterol inhaler   HLD (hyperlipidemia)    Hypertension    Peripheral vascular disease (HCC)     Medications:  Medications Prior to Admission  Medication Sig Dispense Refill Last Dose/Taking   acetaminophen (TYLENOL) 500 MG tablet Take 2 tablets (1,000 mg total) by mouth every 6 (six) hours as needed for mild pain or fever (For the first 3 days, may take 2 tabs (1000 mg total) 4 times daily scheduled following which can take 2 tabs (1000 mg total) 4 times daily as needed.).   Past Week   atorvastatin (LIPITOR) 10 MG tablet Take 10 mg by mouth daily.   Past  Week   gabapentin (NEURONTIN) 600 MG tablet Take 1 tablet (600 mg total) by mouth 3 (three) times daily. 90 tablet 3 Past Week   pioglitazone (ACTOS) 15 MG tablet Take 15 mg by mouth daily.   Taking   insulin aspart protamine - aspart (NOVOLOG 70/30 MIX) (70-30) 100 UNIT/ML FlexPen Inject 10 Units into the skin 2 (two) times daily with a meal. (Patient not taking: Reported on 07/29/2023) 15 mL 0 Not Taking   Insulin Pen Needle 32G X 4 MM MISC Use as directed two times daily. 100 each 0    methocarbamol (ROBAXIN) 500 MG tablet Take 1 tablet (500 mg total) by mouth every 6 (six) hours as needed for muscle spasms. (Patient not taking: Reported on 07/29/2023) 30 tablet 0 Not Taking   Multiple Vitamin (MULTIVITAMIN WITH MINERALS) TABS tablet Take 1 tablet by mouth daily. (Patient not taking: Reported on 07/29/2023)   Not Taking   nicotine (NICODERM CQ - DOSED IN MG/24 HOURS) 14 mg/24hr patch Place 1 patch (14 mg total) onto the skin daily. (Patient not taking: Reported on 07/29/2023) 28 patch 0 Not Taking   oxycodone (OXY-IR) 5 MG capsule Take 1 capsule (5 mg total) by mouth  every 6 (six) hours as needed. (Patient not taking: Reported on 07/29/2023) 28 capsule 0 Not Taking   Oxycodone HCl 10 MG TABS Take 0.5 tablets (5 mg total) by mouth every 4 (four) hours as needed. (Patient not taking: Reported on 07/29/2023) 42 tablet 0 Not Taking   Scheduled:   aspirin EC  81 mg Oral Daily   atorvastatin  10 mg Oral QPM   dexamethasone (DECADRON) injection  4 mg Intravenous QHS   digoxin  0.25 mg Oral Daily   diltiazem  90 mg Oral Q8H   insulin aspart  0-20 Units Subcutaneous TID PC & HS   metoprolol tartrate  5 mg Intravenous Q6H   Infusions:   sodium chloride 100 mL/hr at 07/29/23 1306   ceFEPime (MAXIPIME) IV 2 g (07/29/23 1009)   diltiazem (CARDIZEM) infusion 15 mg/hr (07/29/23 1006)   heparin 1,200 Units/hr (07/29/23 1313)   zoledronic acid (ZOMETA) 4 mg in sodium chloride 0.9 % 100 mL IVPB       Assessment: 62 YOM admitted for sepsis and found to have new onset atrial fibrillation and new subsegmental pulmonary embolism. 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 After discussion with hospitalist MD and neurology MD, will proceed with heparin initation at this time for treatment of PE. Pharmacy has been consulted to assist with dosing.   PM follow-up: Initial heparin level is < 0.10, undetectable on 1200 units/hr.  No known infusion issues or bleeding. Per prior communication from Dr. Chestine Spore, heparin drip to be held at 0500 on 12/17 for bronchoscopy at 0815. Per Dr. Truett Perna, will also need biopsy of lung mass. IR consult pending.  Goal of Therapy:  Heparin level 0.3-0.5 units/ml - narrow goal due to patient at very high bleeding risk Monitor platelets by anticoagulation protocol: Yes   Plan:  Increase heparin drip to 1350 units/hr Next heparin level ~ 6 hours after increase. Daily heparin level and CBC while on heparin Monitor for signs/symptoms of bleeding. Heparin drip to be held at 0500 on 12/17 for bronchoscopy at 0815.   RN aware and stop time is in place.  Dennie Fetters, RPh 07/29/2023 7:06 PM

## 2023-07-29 NOTE — Progress Notes (Signed)
Patient returned from CT at this time.

## 2023-07-29 NOTE — Plan of Care (Signed)
  Problem: Education: Goal: Individualized Educational Video(s) Outcome: Progressing   Problem: Coping: Goal: Ability to adjust to condition or change in health will improve Outcome: Progressing   Problem: Fluid Volume: Goal: Ability to maintain a balanced intake and output will improve 07/29/2023 0522 by Murlean Hark, RN Outcome: Progressing 07/29/2023 0212 by Murlean Hark, RN Outcome: Progressing   Problem: Health Behavior/Discharge Planning: Goal: Ability to identify and utilize available resources and services will improve Outcome: Progressing Goal: Ability to manage health-related needs will improve Outcome: Progressing

## 2023-07-29 NOTE — Assessment & Plan Note (Addendum)
-   CTA chest demonstrated small subsegmental embolic burden in the right lower lobe with no evidence of right heart strain. -If this was a typical case of thromboembolism provoked by malignancy, normally would anticoagulate. In addition to this, he has new onset atrial fibrillation with CHA2D2VASc of 3 that typically would need anticoagulation as well.  However patient's case is quite complex as he also has a brain mass with high risk of hemorrhage. Unfortunately he is not a candidate for IVC filter placement in any reasonable time frame as he has ongoing sepsis from bilateral pyelonephritis. Case was discussed with neurology Dr. Otelia Limes who will see in consultation and advised discussion with PCCM regarding his pulmonary embolism extension risk.  PCCM was curbsided and literature describes that 1 subsegmental PE may not need treatment unless other thromboses are found.  Bilateral LE venous Doppler ultrasound currently pending.  Although the risk of embolism extension is high in the setting of his prothrombotic state with malignancy, the risk of hemorrhage from brain mass likely is higher and outweighs the benefit of treating a single subsegmental PE.  -In light of a brain bleed being more catastrophic than potential risk of stroke with a possibly isolated atrial fibrillation secondary to sepsis, would lean towards no anticoagulation until resolution of his infection to be consider for IVC filter placement. -appreciate further recommends and input from neurology

## 2023-07-30 ENCOUNTER — Encounter (HOSPITAL_COMMUNITY): Payer: Self-pay | Admitting: Family Medicine

## 2023-07-30 ENCOUNTER — Encounter (HOSPITAL_COMMUNITY)
Admission: EM | Disposition: A | Discharge status: Home Health | Payer: Self-pay | Source: Home / Self Care | Attending: Internal Medicine

## 2023-07-30 ENCOUNTER — Inpatient Hospital Stay (HOSPITAL_COMMUNITY): Payer: Medicaid Other | Admitting: Anesthesiology

## 2023-07-30 ENCOUNTER — Inpatient Hospital Stay (HOSPITAL_COMMUNITY): Payer: Medicaid Other

## 2023-07-30 DIAGNOSIS — R918 Other nonspecific abnormal finding of lung field: Secondary | ICD-10-CM | POA: Diagnosis not present

## 2023-07-30 DIAGNOSIS — C3412 Malignant neoplasm of upper lobe, left bronchus or lung: Secondary | ICD-10-CM

## 2023-07-30 DIAGNOSIS — A419 Sepsis, unspecified organism: Secondary | ICD-10-CM | POA: Diagnosis not present

## 2023-07-30 DIAGNOSIS — G9341 Metabolic encephalopathy: Secondary | ICD-10-CM | POA: Diagnosis not present

## 2023-07-30 DIAGNOSIS — I4891 Unspecified atrial fibrillation: Secondary | ICD-10-CM

## 2023-07-30 DIAGNOSIS — I5020 Unspecified systolic (congestive) heart failure: Secondary | ICD-10-CM

## 2023-07-30 DIAGNOSIS — R6520 Severe sepsis without septic shock: Secondary | ICD-10-CM | POA: Diagnosis not present

## 2023-07-30 HISTORY — PX: VIDEO BRONCHOSCOPY: SHX5072

## 2023-07-30 HISTORY — PX: BRONCHIAL BIOPSY: SHX5109

## 2023-07-30 HISTORY — PX: HEMOSTASIS CONTROL: SHX6838

## 2023-07-30 LAB — MAGNESIUM: Magnesium: 2 mg/dL (ref 1.7–2.4)

## 2023-07-30 LAB — BRAIN NATRIURETIC PEPTIDE: B Natriuretic Peptide: 269.7 pg/mL — ABNORMAL HIGH (ref 0.0–100.0)

## 2023-07-30 LAB — CBC WITH DIFFERENTIAL/PLATELET
Abs Immature Granulocytes: 0.1 10*3/uL — ABNORMAL HIGH (ref 0.00–0.07)
Basophils Absolute: 0.1 10*3/uL (ref 0.0–0.1)
Basophils Relative: 0 %
Eosinophils Absolute: 0 10*3/uL (ref 0.0–0.5)
Eosinophils Relative: 0 %
HCT: 35.5 % — ABNORMAL LOW (ref 39.0–52.0)
Hemoglobin: 11.1 g/dL — ABNORMAL LOW (ref 13.0–17.0)
Immature Granulocytes: 1 %
Lymphocytes Relative: 5 %
Lymphs Abs: 0.8 10*3/uL (ref 0.7–4.0)
MCH: 26.4 pg (ref 26.0–34.0)
MCHC: 31.3 g/dL (ref 30.0–36.0)
MCV: 84.5 fL (ref 80.0–100.0)
Monocytes Absolute: 0.6 10*3/uL (ref 0.1–1.0)
Monocytes Relative: 4 %
Neutro Abs: 16.6 10*3/uL — ABNORMAL HIGH (ref 1.7–7.7)
Neutrophils Relative %: 90 %
Platelets: 448 10*3/uL — ABNORMAL HIGH (ref 150–400)
RBC: 4.2 MIL/uL — ABNORMAL LOW (ref 4.22–5.81)
RDW: 14.5 % (ref 11.5–15.5)
WBC: 18.2 10*3/uL — ABNORMAL HIGH (ref 4.0–10.5)
nRBC: 0 % (ref 0.0–0.2)

## 2023-07-30 LAB — PHOSPHORUS: Phosphorus: 3.7 mg/dL (ref 2.5–4.6)

## 2023-07-30 LAB — COMPREHENSIVE METABOLIC PANEL
ALT: 13 U/L (ref 0–44)
AST: 19 U/L (ref 15–41)
Albumin: 2 g/dL — ABNORMAL LOW (ref 3.5–5.0)
Alkaline Phosphatase: 82 U/L (ref 38–126)
Anion gap: 8 (ref 5–15)
BUN: 32 mg/dL — ABNORMAL HIGH (ref 8–23)
CO2: 22 mmol/L (ref 22–32)
Calcium: 11.9 mg/dL — ABNORMAL HIGH (ref 8.9–10.3)
Chloride: 101 mmol/L (ref 98–111)
Creatinine, Ser: 0.9 mg/dL (ref 0.61–1.24)
GFR, Estimated: >60 mL/min (ref >60)
Glucose, Bld: 236 mg/dL — ABNORMAL HIGH (ref 70–99)
Potassium: 4.3 mmol/L (ref 3.5–5.1)
Sodium: 131 mmol/L — ABNORMAL LOW (ref 135–145)
Total Bilirubin: 0.6 mg/dL (ref <1.2)
Total Protein: 6 g/dL — ABNORMAL LOW (ref 6.5–8.1)

## 2023-07-30 LAB — GLUCOSE, CAPILLARY
Glucose-Capillary: 204 mg/dL — ABNORMAL HIGH (ref 70–99)
Glucose-Capillary: 174 mg/dL — ABNORMAL HIGH (ref 70–99)
Glucose-Capillary: 161 mg/dL — ABNORMAL HIGH (ref 70–99)

## 2023-07-30 LAB — HEPARIN LEVEL (UNFRACTIONATED): Heparin Unfractionated: <0.1 [IU]/mL — ABNORMAL LOW (ref 0.30–0.70)

## 2023-07-30 LAB — C-REACTIVE PROTEIN: CRP: 11 mg/dL — ABNORMAL HIGH (ref <1.0)

## 2023-07-30 LAB — PROCALCITONIN: Procalcitonin: <0.1 ng/mL

## 2023-07-30 LAB — PARATHYROID HORMONE, INTACT (NO CA): PTH: 8 pg/mL — ABNORMAL LOW (ref 15–65)

## 2023-07-30 SURGERY — VIDEO BRONCHOSCOPY WITHOUT FLUORO
Anesthesia: General

## 2023-07-30 MED ORDER — DILTIAZEM HCL 25 MG/5ML IV SOLN
10.0000 mg | Freq: Four times a day (QID) | INTRAVENOUS | Status: DC | PRN
Start: 2023-07-30 — End: 2023-07-31
  Administered 2023-07-30: 10 mg via INTRAVENOUS
  Filled 2023-07-30 (×2): qty 5

## 2023-07-30 MED ORDER — PHENYLEPHRINE HCL-NACL 20-0.9 MG/250ML-% IV SOLN
INTRAVENOUS | Status: DC | PRN
  Administered 2023-07-30: 30 ug/min via INTRAVENOUS

## 2023-07-30 MED ORDER — FENTANYL CITRATE (PF) 250 MCG/5ML IJ SOLN
INTRAMUSCULAR | Status: DC | PRN
  Administered 2023-07-30: 100 ug via INTRAVENOUS

## 2023-07-30 MED ORDER — METOPROLOL TARTRATE 50 MG PO TABS
50.0000 mg | ORAL_TABLET | Freq: Four times a day (QID) | ORAL | Status: DC
  Administered 2023-07-30 – 2023-07-31 (×3): 50 mg via ORAL
  Filled 2023-07-30 (×3): qty 1

## 2023-07-30 MED ORDER — PHENYLEPHRINE 80 MCG/ML (10ML) SYRINGE FOR IV PUSH (FOR BLOOD PRESSURE SUPPORT)
PREFILLED_SYRINGE | INTRAVENOUS | Status: DC | PRN
  Administered 2023-07-30 (×2): 160 ug via INTRAVENOUS

## 2023-07-30 MED ORDER — FENTANYL CITRATE (PF) 100 MCG/2ML IJ SOLN
INTRAMUSCULAR | Status: AC
  Filled 2023-07-30: qty 2

## 2023-07-30 MED ORDER — SODIUM CHLORIDE 0.9 % IV SOLN
INTRAVENOUS | Status: DC
Start: 2023-07-31 — End: 2023-07-31

## 2023-07-30 MED ORDER — ALBUMIN HUMAN 5 % IV SOLN: INTRAVENOUS | Status: DC | PRN

## 2023-07-30 MED ORDER — LACTATED RINGERS IV SOLN: INTRAVENOUS | Status: DC

## 2023-07-30 MED ORDER — HEPARIN (PORCINE) 25000 UT/250ML-% IV SOLN
1700.0000 [IU]/h | INTRAVENOUS | Status: DC
  Administered 2023-07-30: 1500 [IU]/h via INTRAVENOUS
  Filled 2023-07-30 (×2): qty 250

## 2023-07-30 MED ORDER — PROPOFOL 10 MG/ML IV BOLUS
INTRAVENOUS | Status: DC | PRN
  Administered 2023-07-30: 120 mg via INTRAVENOUS

## 2023-07-30 MED ORDER — SUCCINYLCHOLINE CHLORIDE 200 MG/10ML IV SOSY
PREFILLED_SYRINGE | INTRAVENOUS | Status: DC | PRN
  Administered 2023-07-30: 120 mg via INTRAVENOUS

## 2023-07-30 MED ORDER — INSULIN GLARGINE-YFGN 100 UNIT/ML ~~LOC~~ SOLN
10.0000 [IU] | Freq: Every day | SUBCUTANEOUS | Status: DC
  Administered 2023-07-31 – 2023-08-01 (×2): 10 [IU] via SUBCUTANEOUS
  Filled 2023-07-30 (×4): qty 0.1

## 2023-07-30 MED ORDER — SODIUM CHLORIDE (PF) 0.9 % IJ SOLN
PREFILLED_SYRINGE | INTRAMUSCULAR | Status: DC | PRN
  Administered 2023-07-30 (×3): 3 mL

## 2023-07-30 MED ORDER — SODIUM CHLORIDE 0.9 % IV SOLN: INTRAVENOUS | Status: DC | PRN

## 2023-07-30 MED ORDER — PROPOFOL 500 MG/50ML IV EMUL
INTRAVENOUS | Status: DC | PRN
  Administered 2023-07-30: 200 ug/kg/min via INTRAVENOUS

## 2023-07-30 MED ORDER — DILTIAZEM HCL 30 MG PO TABS
30.0000 mg | ORAL_TABLET | Freq: Three times a day (TID) | ORAL | Status: DC
  Administered 2023-07-30 (×2): 30 mg via ORAL
  Filled 2023-07-30 (×2): qty 1

## 2023-07-30 MED ORDER — LIDOCAINE 2% (20 MG/ML) 5 ML SYRINGE
INTRAMUSCULAR | Status: DC | PRN
  Administered 2023-07-30: 60 mg via INTRAVENOUS

## 2023-07-30 NOTE — Plan of Care (Signed)

## 2023-07-30 NOTE — Progress Notes (Addendum)
STROKE TEAM PROGRESS NOTE   BRIEF HPI Mr. Cody Pena is a 62 y.o. male with history of arthritis, depression, diabetes, dyspnea, hyperlipidemia, hypertension, peripheral vascular disease and right BKA presenting with altered mental status and back pain.  He was found to have a large left lung mass, L1 vertebral mass and cystic and solid peripherally enhancing mass at left occipital lobe, concerning for metastasis.  He was also found to have a punctate acute infarct in the right parietal lobe on MRI but has no clinical symptoms related to this.  INTERIM HISTORY/SUBJECTIVE Patient had bronchoscopy this morning.  He is drowsy on exam today    He is awaiting biopsy results of his left lung mass, will be okay to start anticoagulation after this is done.   OBJECTIVE  CBC    Component Value Date/Time   WBC 18.2 (H) 07/30/2023 0422   RBC 4.20 (L) 07/30/2023 0422   HGB 11.1 (L) 07/30/2023 0422   HCT 35.5 (L) 07/30/2023 0422   PLT 448 (H) 07/30/2023 0422   MCV 84.5 07/30/2023 0422   MCH 26.4 07/30/2023 0422   MCHC 31.3 07/30/2023 0422   RDW 14.5 07/30/2023 0422   LYMPHSABS 0.8 07/30/2023 0422   MONOABS 0.6 07/30/2023 0422   EOSABS 0.0 07/30/2023 0422   BASOSABS 0.1 07/30/2023 0422    BMET    Component Value Date/Time   NA 131 (L) 07/30/2023 0422   K 4.3 07/30/2023 0422   CL 101 07/30/2023 0422   CO2 22 07/30/2023 0422   GLUCOSE 236 (H) 07/30/2023 0422   BUN 32 (H) 07/30/2023 0422   CREATININE 0.90 07/30/2023 0422   CALCIUM 11.9 (H) 07/30/2023 0422   GFRNONAA >60 07/30/2023 0422    IMAGING past 24 hours ECHOCARDIOGRAM COMPLETE Result Date: 07/29/2023    ECHOCARDIOGRAM REPORT   Patient Name:   Cody Pena Date of Exam: 07/29/2023 Medical Rec #:  [id number]       Height:       75.0 in Accession #:    [id number]      Weight:       171.3 lb Date of Birth:  04-02-1961      BSA:          2.055 m Patient Age:    62 years        BP:           140/117 mmHg Patient Gender: M                HR:           122 bpm. Exam Location:  Inpatient Procedure: 2D Echo, Cardiac Doppler and Color Doppler Indications:    Atrial Fibrillation I48.91  History:        Patient has no prior history of Echocardiogram examinations.                 Arrythmias:Atrial Fibrillation, Signs/Symptoms:Dyspnea; Risk                 Factors:Hypertension, Diabetes, Current Smoker and Dyslipidemia.  Sonographer:    Lucendia Herrlich RCS Referring Phys: [id number] CHING T TU  Sonographer Comments: Image acquisition challenging due to uncooperative patient. IMPRESSIONS  1. Left ventricular ejection fraction, by estimation, is 30 to 35%. The left ventricle has moderately decreased function. The left ventricle demonstrates global hypokinesis. The left ventricular internal cavity size was mildly dilated. Left ventricular diastolic parameters are indeterminate.  2. Right ventricular systolic function is normal. The right ventricular size is  normal. There is normal pulmonary artery systolic pressure. The estimated right ventricular systolic pressure is 25.8 mmHg.  3. The mitral valve is normal in structure. Trivial mitral valve regurgitation. No evidence of mitral stenosis.  4. The aortic valve is tricuspid. There is mild calcification of the aortic valve. Aortic valve regurgitation is trivial. No aortic stenosis is present.  5. The inferior vena cava is dilated in size with <50% respiratory variability, suggesting right atrial pressure of 15 mmHg.  6. The patient was in atrial fibrillation. FINDINGS  Left Ventricle: Left ventricular ejection fraction, by estimation, is 30 to 35%. The left ventricle has moderately decreased function. The left ventricle demonstrates global hypokinesis. The left ventricular internal cavity size was mildly dilated. There is no left ventricular hypertrophy. Left ventricular diastolic parameters are indeterminate. Right Ventricle: The right ventricular size is normal. No increase in right ventricular wall  thickness. Right ventricular systolic function is normal. There is normal pulmonary artery systolic pressure. The tricuspid regurgitant velocity is 1.64 m/s, and  with an assumed right atrial pressure of 15 mmHg, the estimated right ventricular systolic pressure is 25.8 mmHg. Left Atrium: Left atrial size was normal in size. Right Atrium: Right atrial size was normal in size. Pericardium: There is no evidence of pericardial effusion. Mitral Valve: The mitral valve is normal in structure. There is mild thickening of the mitral valve leaflet(s). Trivial mitral valve regurgitation. No evidence of mitral valve stenosis. Tricuspid Valve: The tricuspid valve is normal in structure. Tricuspid valve regurgitation is trivial. Aortic Valve: The aortic valve is tricuspid. There is mild calcification of the aortic valve. Aortic valve regurgitation is trivial. No aortic stenosis is present. Aortic valve peak gradient measures 9.7 mmHg. Pulmonic Valve: The pulmonic valve was normal in structure. Pulmonic valve regurgitation is not visualized. Aorta: The aortic root is normal in size and structure. Venous: The inferior vena cava is dilated in size with less than 50% respiratory variability, suggesting right atrial pressure of 15 mmHg. IAS/Shunts: No atrial level shunt detected by color flow Doppler.  LEFT VENTRICLE PLAX 2D LVIDd:         5.50 cm   Diastology LVIDs:         4.60 cm   LV e' medial:    9.64 cm/s LV PW:         1.00 cm   LV E/e' medial:  10.8 LV IVS:        0.90 cm   LV e' lateral:   14.10 cm/s LVOT diam:     2.10 cm   LV E/e' lateral: 7.4 LV SV:         62 LV SV Index:   30 LVOT Area:     3.46 cm  RIGHT VENTRICLE             IVC RV S prime:     15.50 cm/s  IVC diam: 2.40 cm TAPSE (M-mode): 2.1 cm LEFT ATRIUM         Index LA diam:    3.90 cm 1.90 cm/m  AORTIC VALVE AV Area (Vmax): 2.73 cm AV Vmax:        155.50 cm/s AV Peak Grad:   9.7 mmHg LVOT Vmax:      122.67 cm/s LVOT Vmean:     80.267 cm/s LVOT VTI:        0.180 m  AORTA Ao Root diam: 3.70 cm Ao Asc diam:  3.60 cm MITRAL VALVE  TRICUSPID VALVE MV Area (PHT): 5.84 cm     TR Peak grad:   10.8 mmHg MV Decel Time: 130 msec     TR Vmax:        164.00 cm/s MV E velocity: 104.00 cm/s MV A velocity: 70.50 cm/s   SHUNTS MV E/A ratio:  1.48         Systemic VTI:  0.18 m                             Systemic Diam: 2.10 cm Dalton McleanMD Electronically signed by Wilfred Lacy Signature Date/Time: 07/29/2023/5:47:50 PM    Final     Vitals:   07/30/23 1100 07/30/23 1110 07/30/23 1134 07/30/23 1231  BP: 111/78 115/63  115/60  Pulse: 73 80 90 66  Resp: (!) 22 20 (!) 22 13  Temp:    (!) 97.3 F (36.3 C)  TempSrc:    Oral  SpO2: 98% 95% 95%   Weight:      Height:         PHYSICAL EXAM General:  Alert, well-nourished, well-developed middle-age Caucasian male in no acute distress Psych:  Mood and affect appropriate for situation CV: Regular rate and rhythm on monitor Respiratory:  Regular, unlabored respirations on room air GI: Abdomen soft and nontender   NEURO:  Mental Status: AA&Ox3 Speech/Language: speech is without dysarthria or aphasia.    Cranial Nerves:  II: PERRL.  III, IV, VI: EOMI. Eyelids elevate symmetrically.  VII: Face is symmetrical resting and smiling VIII: hearing intact to voice. IX, X: Phonation is normal.  XII: tongue is midline without fasciculations. Motor: 5/5 strength to all muscle groups tested.  Tone: is normal and bulk is normal Sensation- Intact to light touch bilaterally. 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  Left atrial size is normal. LDL 48 HgbA1c 9.9 VTE prophylaxis -Fully anticoagulated with heparin No antithrombotic prior to admission, now on heparin IV  Therapy recommendations:  Pending Disposition: Pending  Left upper lobe mass, concerning for cancer Patient has left upper lobe mass as well as multiple pulmonary nodules and right lung and mediastinal hilar and left axillary lymphadenopathy Patient also has L1 vertebral mass with compression fracture Awaiting biopsy of this mass Awaiting oncology recommendations  Pulmonary embolus Right lower lobe PE noted on CT angio of chest Continue anticoagulation with heparin  Atrial fibrillation Home Meds: None Continue telemetry monitoring Continue diltiazem 90 mg every 8 hours Begin anticoagulation with heparin and changed to NOAC at discharge  Hypertension Home meds: None Stable Maintain normotension  Hyperlipidemia Home meds: Atorvastatin 10 mg daily, resumed in hospital LDL 48, goal < 70 High intensity statin not indicated as LDL below goal Continue statin at discharge  Diabetes type II Uncontrolled Home meds: NovoLog 70/30 10 units twice daily, pioglitazone 15 mg daily HgbA1c 9.9, goal < 7.0 CBGs SSI Recommend close follow-up with PCP for better DM control  Tobacco Abuse Patient smokes 0.5  packs per day for 47 years Tobacco cessation advised  Other Stroke Risk Factors None   Other Active Problems UTI-continue cefepime per primary team   Patient presented with subacute decreased appetite and generalized weakness and CT and MRI shows multicystic left parietal lesion likely metastasis from suspected primary lung cancer.  MRI also shows tiny right parietal punctate cortical diffusion hyperintensity likely is silent infarct which  may be related from hypercoagulability from lung cancer.  This appears to be clinically asymptomatic.  Patient also has pulmonary embolism and will need anticoagulation with IV heparin and he can be discharged home on oral anticoagulation  .  Maintain aggressive risk factor modification.  Long discussion patient and answered questions.  Discussed with Dr. Thedore Mins.  Stroke team will sign off.  Kindly call for questions.  Greater than 50% time during this 35 minute visit was spent on counseling and coordination of care about his silent brain stroke.  As well as brain metastasis and suspected primary lung cancer discussion about plan of care and need for anticoagulation due to hypercoagulability.  Delia Heady, MD Medical Director [facility] Pager: [phone] 07/30/2023 4:29 PM  To contact Stroke Continuity provider, please refer to [email or web address]. After hours, contact General Neurology

## 2023-07-30 NOTE — Transfer of Care (Signed)
Immediate Anesthesia Transfer of Care Note  Patient: Cody Pena  Procedure(s) Performed: VIDEO BRONCHOSCOPY WITHOUT FLUORO HEMOSTASIS CONTROL BRONCHIAL BIOPSIES  Patient Location: PACU  Anesthesia Type:General  Level of Consciousness: awake, drowsy, patient cooperative, and responds to stimulation  Airway & Oxygen Therapy: Patient Spontanous Breathing and Patient connected to face mask oxygen  Post-op Assessment: Report given to RN and Post -op Vital signs reviewed and stable  Post vital signs: Reviewed and stable  Last Vitals:  Vitals Value Taken Time  BP 103/60 07/30/23 1050  Temp 98   Pulse 69 07/30/23 1053  Resp 21 07/30/23 1053  SpO2 97 % 07/30/23 1053  Vitals shown include unfiled device data.    Patients Stated Pain Goal: 0 (07/29/23 1200)  Complications: No notable events documented.

## 2023-07-30 NOTE — Progress Notes (Signed)
PHARMACY - ANTICOAGULATION CONSULT NOTE  Pharmacy Consult for heparin Indication:  new onset atrial fibrillation and new subsegmental pulmonary embolism  Allergies  Allergen Reactions   Ceftriaxone Itching, Nausea And Vomiting and Rash    Noted minutes after starting medication No rash noted with Ancef on 01-31-2022 during general anesthesia Has received cefepime and cefazolin without reaction    Patient Measurements: Height: 6\' 3"  (190.5 cm) Weight: 77.7 kg (171 lb 4.8 oz) IBW/kg (Calculated) : 84.5 Heparin Dosing Weight: 77 kg  Vital Signs: Temp: 97.4 F (36.3 C) (12/17 0925) Temp Source: Temporal (12/17 0925) BP: 113/71 (12/17 0935) Pulse Rate: 81 (12/17 0935)  Labs: Recent Labs    07/28/23 1548 07/28/23 1756 07/29/23 0422 07/29/23 1737 07/30/23 0114 07/30/23 0422  HGB 13.0  --  11.3*  --   --  11.1*  HCT 40.9  --  35.7*  --   --  35.5*  PLT 515*  --  391  --   --  448*  HEPARINUNFRC  --   --   --  <0.10* <0.10*  --   CREATININE 1.01  --  1.19  --   --  0.90  TROPONINIHS 21* 24*  --   --   --   --     Estimated Creatinine Clearance: 91.1 mL/min (by C-G formula based on SCr of 0.9 mg/dL).   Medical History: Past Medical History:  Diagnosis Date   Arthritis    COVID    has had Covid 3 times, not sick with it   COVID    has had 3 times mild to no symptoms   Depression    Diabetes mellitus without complication (HCC)    pt states he has now been told that he is a type 1 diabetic.   Dyspnea    albuterol inhaler   HLD (hyperlipidemia)    Hypertension    Peripheral vascular disease (HCC)     Medications:  Medications Prior to Admission  Medication Sig Dispense Refill Last Dose/Taking   acetaminophen (TYLENOL) 500 MG tablet Take 2 tablets (1,000 mg total) by mouth every 6 (six) hours as needed for mild pain or fever (For the first 3 days, may take 2 tabs (1000 mg total) 4 times daily scheduled following which can take 2 tabs (1000 mg total) 4 times daily as  needed.).   Past Week   atorvastatin (LIPITOR) 10 MG tablet Take 10 mg by mouth daily.   Past Week   gabapentin (NEURONTIN) 600 MG tablet Take 1 tablet (600 mg total) by mouth 3 (three) times daily. 90 tablet 3 Past Week   pioglitazone (ACTOS) 15 MG tablet Take 15 mg by mouth daily.   Taking   insulin aspart protamine - aspart (NOVOLOG 70/30 MIX) (70-30) 100 UNIT/ML FlexPen Inject 10 Units into the skin 2 (two) times daily with a meal. (Patient not taking: Reported on 07/29/2023) 15 mL 0 Not Taking   Insulin Pen Needle 32G X 4 MM MISC Use as directed two times daily. 100 each 0    methocarbamol (ROBAXIN) 500 MG tablet Take 1 tablet (500 mg total) by mouth every 6 (six) hours as needed for muscle spasms. (Patient not taking: Reported on 07/29/2023) 30 tablet 0 Not Taking   Multiple Vitamin (MULTIVITAMIN WITH MINERALS) TABS tablet Take 1 tablet by mouth daily. (Patient not taking: Reported on 07/29/2023)   Not Taking   nicotine (NICODERM CQ - DOSED IN MG/24 HOURS) 14 mg/24hr patch Place 1 patch (14 mg total)  onto the skin daily. (Patient not taking: Reported on 07/29/2023) 28 patch 0 Not Taking   oxycodone (OXY-IR) 5 MG capsule Take 1 capsule (5 mg total) by mouth every 6 (six) hours as needed. (Patient not taking: Reported on 07/29/2023) 28 capsule 0 Not Taking   Oxycodone HCl 10 MG TABS Take 0.5 tablets (5 mg total) by mouth every 4 (four) hours as needed. (Patient not taking: Reported on 07/29/2023) 42 tablet 0 Not Taking   Scheduled:   [MAR Hold] aspirin EC  81 mg Oral Daily   [MAR Hold] atorvastatin  10 mg Oral QPM   [MAR Hold] dexamethasone (DECADRON) injection  4 mg Intravenous QHS   [MAR Hold] digoxin  0.25 mg Oral Daily   [MAR Hold] diltiazem  30 mg Oral Q8H   [MAR Hold] insulin aspart  0-20 Units Subcutaneous TID PC & HS   [MAR Hold] insulin glargine-yfgn  10 Units Subcutaneous Daily   [MAR Hold] metoprolol tartrate  5 mg Intravenous Q6H   [MAR Hold] metoprolol tartrate  50 mg Oral QID    Infusions:   sodium chloride 100 mL/hr at 07/30/23 0624   [MAR Hold] ceFEPime (MAXIPIME) IV 2 g (07/30/23 0343)    Assessment: 62 YOM admitted for sepsis and found to have new onset atrial fibrillation and new subsegmental pulmonary embolism. 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 After discussion with hospitalist MD and neurology MD, will proceed with heparin initation at this time for treatment of PE. Pharmacy has been consulted to assist with dosing.   Heparin drip to be held at 0500 on 12/17 for bronchoscopy at 0815. Bronchoscopy complete. Plan to resume heparin drip at 2000. CBC stable - hgb 11.1 and plts 448.   Goal of Therapy:  Heparin level 0.3-0.5 units/ml - narrow goal due to patient at very high bleeding risk Monitor platelets by anticoagulation protocol: Yes   Plan:  Resume heparin drip at 1500 units/hr at 2000 on 12/17  Check heparin level in 6 hours after resuming heparin  Monitor for signs/symptoms of bleeding F/U oral anticoagulation plans   Roslyn Smiling, PharmD PGY1 Pharmacy Resident 07/30/2023 11:04 AM

## 2023-07-30 NOTE — Progress Notes (Signed)
Rounding Note    Patient Name: Cody Pena Date of Encounter: 07/30/2023  [facility] Cardiologist:   Subjective   Pt comfortable in bed   Says his breathing is OK   Denies CP    Inpatient Medications    Scheduled Meds:  aspirin EC  81 mg Oral Daily   atorvastatin  10 mg Oral QPM   dexamethasone (DECADRON) injection  4 mg Intravenous QHS   digoxin  0.25 mg Oral Daily   diltiazem  90 mg Oral Q8H   insulin aspart  0-20 Units Subcutaneous TID PC & HS   insulin glargine-yfgn  10 Units Subcutaneous Daily   metoprolol tartrate  5 mg Intravenous Q6H   Continuous Infusions:  sodium chloride 100 mL/hr at 07/30/23 0624   ceFEPime (MAXIPIME) IV 2 g (07/30/23 0343)   PRN Meds: diltiazem, haloperidol lactate, HYDROcodone-acetaminophen, morphine   Vital Signs    Vitals:   07/30/23 0000 07/30/23 0200 07/30/23 0400 07/30/23 0800  BP: 115/60 125/69 116/79 120/74  Pulse: 90 81 75 77  Resp: 12 17 13 20   Temp: 98.5 F (36.9 C)  98.3 F (36.8 C) (!) 97.2 F (36.2 C)  TempSrc: Oral  Oral Oral  SpO2: 96% 91% 93%   Weight:      Height:        Intake/Output Summary (Last 24 hours) at 07/30/2023 0851 Last data filed at 07/30/2023 4034 Gross per 24 hour  Intake 1675.57 ml  Output --  Net 1675.57 ml      07/29/2023    2:00 AM 07/28/2023    3:18 PM 01/06/2023    3:00 AM  Last 3 Weights  Weight (lbs) 171 lb 4.8 oz 214 lb 214 lb 15.2 oz  Weight (kg) 77.7 kg 97.07 kg 97.5 kg      Telemetry    Afib   80s  - Personally Reviewed  ECG    No new  - Personally Reviewed  Physical Exam   GEN: No acute distress.   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; s/p R BKA    Labs    High Sensitivity Troponin:   Recent Labs  Lab 07/28/23 1548 07/28/23 1756  TROPONINIHS 21* 24*     Chemistry Recent Labs  Lab 07/28/23 1548 07/29/23 0422 07/29/23 0942 07/30/23 0422   NA 132* 130*  --  131*  K 4.5 4.5  --  4.3  CL 95* 97*  --  101  CO2 24 24  --  22  GLUCOSE 228* 189*  --  236*  BUN 32* 30*  --  32*  CREATININE 1.01 1.19  --  0.90  CALCIUM 12.3* 12.3*  --  11.9*  MG  --   --  1.9 2.0  PROT 7.3 6.5  --  6.0*  ALBUMIN 2.2* 2.0*  --  2.0*  AST 21 15  --  19  ALT 13 11  --  13  ALKPHOS 95 85  --  82  BILITOT 0.9 0.6  --  0.6  GFRNONAA >60 >60  --  >60  ANIONGAP 13 9  --  8    Lipids No results for input(s): "CHOL", "TRIG", "HDL", "LABVLDL", "LDLCALC", "CHOLHDL" in the last 168 hours.  Hematology Recent Labs  Lab 07/28/23 1548 07/29/23 0422 07/30/23 0422  WBC 18.6* 18.7* 18.2*  RBC 4.92 4.29 4.20*  HGB 13.0  11.3* 11.1*  HCT 40.9 35.7* 35.5*  MCV 83.1 83.2 84.5  MCH 26.4 26.3 26.4  MCHC 31.8 31.7 31.3  RDW 14.3 14.4 14.5  PLT 515* 391 448*   Thyroid  Recent Labs  Lab 07/29/23 0422  TSH 1.533    BNP Recent Labs  Lab 07/29/23 0942 07/30/23 0422  BNP 250.4* 269.7*    DDimer No results for input(s): "DDIMER" in the last 168 hours.   Radiology    VAS [place] LOWER EXTREMITY VENOUS (DVT) Result Date: 07/29/2023  Lower Venous DVT Study Patient Name:  Cody Pena  Date of Exam:   07/29/2023 Medical Rec #: [id number]        Accession #:    [id number] Date of Birth: [birth year]       Patient Gender: M Patient Age:   62 years Exam Location:  [facility] Procedure:      VAS [place] LOWER EXTREMITY VENOUS (DVT) Referring Phys: CHING TU --------------------------------------------------------------------------------  Indications: Pulmonary embolism.  Risk Factors: Confirmed PE Surgery RT BKA. Anticoagulation: Heparin. Comparison Study: No significant changes seen since prior exam 07/24/21 Performing Technologist: Shona Simpson  Examination Guidelines: A complete evaluation includes B-mode imaging, spectral Doppler, color Doppler, and power Doppler as needed of all accessible portions of each vessel. Bilateral testing is considered an integral  part of a complete examination. Limited examinations for reoccurring indications may be performed as noted. The reflux portion of the exam is performed with the patient in reverse Trendelenburg.  +---------+---------------+---------+-----------+----------+--------------+ RIGHT    CompressibilityPhasicitySpontaneityPropertiesThrombus Aging +---------+---------------+---------+-----------+----------+--------------+ CFV      Full           Yes      Yes                                 +---------+---------------+---------+-----------+----------+--------------+ SFJ      Full                                                        +---------+---------------+---------+-----------+----------+--------------+ FV Prox  Full                                                        +---------+---------------+---------+-----------+----------+--------------+ FV Mid   Full                                                        +---------+---------------+---------+-----------+----------+--------------+ FV DistalFull                                                        +---------+---------------+---------+-----------+----------+--------------+ PFV      Full                                                        +---------+---------------+---------+-----------+----------+--------------+  POP      Full           Yes      Yes                                 +---------+---------------+---------+-----------+----------+--------------+ PTV      Full                                                        +---------+---------------+---------+-----------+----------+--------------+ PERO     Full                                                        +---------+---------------+---------+-----------+----------+--------------+   +---------+---------------+---------+-----------+----------+--------------+ LEFT     CompressibilityPhasicitySpontaneityPropertiesThrombus Aging  +---------+---------------+---------+-----------+----------+--------------+ CFV      Full           Yes      Yes                                 +---------+---------------+---------+-----------+----------+--------------+ SFJ      Full                                                        +---------+---------------+---------+-----------+----------+--------------+ FV Prox  Full                                                        +---------+---------------+---------+-----------+----------+--------------+ FV Mid   Full                                                        +---------+---------------+---------+-----------+----------+--------------+ FV DistalFull                                                        +---------+---------------+---------+-----------+----------+--------------+ PFV      Full                                                        +---------+---------------+---------+-----------+----------+--------------+ POP      Full           Yes      Yes                                 +---------+---------------+---------+-----------+----------+--------------+  PTV      Full                                                        +---------+---------------+---------+-----------+----------+--------------+ PERO     Full                                                        +---------+---------------+---------+-----------+----------+--------------+     Summary: BILATERAL: - No evidence of deep vein thrombosis seen in the lower extremities, bilaterally. -No evidence of popliteal cyst, bilaterally.   *See table(s) above for measurements and observations. Electronically signed by Gerarda Fraction on 07/29/2023 at 6:50:19 PM.    Final    ECHOCARDIOGRAM COMPLETE Result Date: 07/29/2023    ECHOCARDIOGRAM REPORT   Patient Name:   Cody Pena Date of Exam: 07/29/2023 Medical Rec #:  [id number]       Height:       75.0 in Accession #:    [id number]       Weight:       171.3 lb Date of Birth:  Sep 25, 1960      BSA:          2.055 m Patient Age:    62 years        BP:           140/117 mmHg Patient Gender: M               HR:           122 bpm. Exam Location:  Inpatient Procedure: 2D Echo, Cardiac Doppler and Color Doppler Indications:    Atrial Fibrillation I48.91  History:        Patient has no prior history of Echocardiogram examinations.                 Arrythmias:Atrial Fibrillation, Signs/Symptoms:Dyspnea; Risk                 Factors:Hypertension, Diabetes, Current Smoker and Dyslipidemia.  Sonographer:    Lucendia Herrlich RCS Referring Phys: [id number] CHING T TU  Sonographer Comments: Image acquisition challenging due to uncooperative patient. IMPRESSIONS  1. Left ventricular ejection fraction, by estimation, is 30 to 35%. The left ventricle has moderately decreased function. The left ventricle demonstrates global hypokinesis. The left ventricular internal cavity size was mildly dilated. Left ventricular diastolic parameters are indeterminate.  2. Right ventricular systolic function is normal. The right ventricular size is normal. There is normal pulmonary artery systolic pressure. The estimated right ventricular systolic pressure is 25.8 mmHg.  3. The mitral valve is normal in structure. Trivial mitral valve regurgitation. No evidence of mitral stenosis.  4. The aortic valve is tricuspid. There is mild calcification of the aortic valve. Aortic valve regurgitation is trivial. No aortic stenosis is present.  5. The inferior vena cava is dilated in size with <50% respiratory variability, suggesting right atrial pressure of 15 mmHg.  6. The patient was in atrial fibrillation. FINDINGS  Left Ventricle: Left ventricular ejection fraction, by estimation, is 30 to 35%. The left ventricle has moderately decreased function. The left ventricle demonstrates global hypokinesis. The left ventricular internal cavity size was mildly dilated.  There is no left ventricular  hypertrophy. Left ventricular diastolic parameters are indeterminate. Right Ventricle: The right ventricular size is normal. No increase in right ventricular wall thickness. Right ventricular systolic function is normal. There is normal pulmonary artery systolic pressure. The tricuspid regurgitant velocity is 1.64 m/s, and  with an assumed right atrial pressure of 15 mmHg, the estimated right ventricular systolic pressure is 25.8 mmHg. Left Atrium: Left atrial size was normal in size. Right Atrium: Right atrial size was normal in size. Pericardium: There is no evidence of pericardial effusion. Mitral Valve: The mitral valve is normal in structure. There is mild thickening of the mitral valve leaflet(s). Trivial mitral valve regurgitation. No evidence of mitral valve stenosis. Tricuspid Valve: The tricuspid valve is normal in structure. Tricuspid valve regurgitation is trivial. Aortic Valve: The aortic valve is tricuspid. There is mild calcification of the aortic valve. Aortic valve regurgitation is trivial. No aortic stenosis is present. Aortic valve peak gradient measures 9.7 mmHg. Pulmonic Valve: The pulmonic valve was normal in structure. Pulmonic valve regurgitation is not visualized. Aorta: The aortic root is normal in size and structure. Venous: The inferior vena cava is dilated in size with less than 50% respiratory variability, suggesting right atrial pressure of 15 mmHg. IAS/Shunts: No atrial level shunt detected by color flow Doppler.  LEFT VENTRICLE PLAX 2D LVIDd:         5.50 cm   Diastology LVIDs:         4.60 cm   LV e' medial:    9.64 cm/s LV PW:         1.00 cm   LV E/e' medial:  10.8 LV IVS:        0.90 cm   LV e' lateral:   14.10 cm/s LVOT diam:     2.10 cm   LV E/e' lateral: 7.4 LV SV:         62 LV SV Index:   30 LVOT Area:     3.46 cm  RIGHT VENTRICLE             IVC RV S prime:     15.50 cm/s  IVC diam: 2.40 cm TAPSE (M-mode): 2.1 cm LEFT ATRIUM         Index LA diam:    3.90 cm 1.90 cm/m   AORTIC VALVE AV Area (Vmax): 2.73 cm AV Vmax:        155.50 cm/s AV Peak Grad:   9.7 mmHg LVOT Vmax:      122.67 cm/s LVOT Vmean:     80.267 cm/s LVOT VTI:       0.180 m  AORTA Ao Root diam: 3.70 cm Ao Asc diam:  3.60 cm MITRAL VALVE                TRICUSPID VALVE MV Area (PHT): 5.84 cm     TR Peak grad:   10.8 mmHg MV Decel Time: 130 msec     TR Vmax:        164.00 cm/s MV E velocity: 104.00 cm/s MV A velocity: 70.50 cm/s   SHUNTS MV E/A ratio:  1.48         Systemic VTI:  0.18 m                             Systemic Diam: 2.10 cm Dalton McleanMD Electronically signed by Wilfred Lacy Signature Date/Time: 07/29/2023/5:47:50 PM    Final  EEG adult Result Date: 07/29/2023 Charlsie Quest, MD     07/29/2023 11:52 AM Patient Name: Cody Pena MRN: [id number] Epilepsy Attending: Charlsie Quest Referring Physician/Provider: Caryl Pina, MD Date: 07/29/2023 Duration: 25.56 mins Patient history: 62 yo M presented to the ED yesterday afternoon with a one week history of AMS and back pain. EEG to evaluate for seizure Level of alertness: Awake AEDs during EEG study: None Technical aspects: This EEG study was done with scalp electrodes positioned according to the 10-20 International system of electrode placement. Electrical activity was reviewed with band pass filter of 1-70Hz , sensitivity of 7 uV/mm, display speed of 84mm/sec with a 60Hz  notched filter applied as appropriate. EEG data were recorded continuously and digitally stored.  Video monitoring was available and reviewed as appropriate. Description: No posterior dominant rhythm was seen. EEG showed continuous generalized 3 to 6 Hz theta-delta slowing admixed with 12-14 Hz beta activity distributed symmetrically and diffusely.  Physiologic photic driving was not seen during photic stimulation.  Hyperventilation was not performed.   ABNORMALITY - Continuous slow, generalized IMPRESSION: This study is suggestive of moderate diffuse encephalopathy. No  seizures or epileptiform discharges were seen throughout the recording. Charlsie Quest    CT Angio Chest Pulmonary Embolism (PE) W or WO Contrast Result Date: 07/29/2023 CLINICAL DATA:  Pulmonary embolism suspected. Positive D-dimer. CT yesterday demonstrating a large left upper lobe neoplasm with mediastinal and hilar adenopathy and bronchovascular encasement. EXAM: CT ANGIOGRAPHY CHEST WITH CONTRAST TECHNIQUE: Multidetector CT imaging of the chest was performed using the standard protocol during bolus administration of intravenous contrast. Multiplanar CT image reconstructions and MIPs were obtained to evaluate the vascular anatomy. RADIATION DOSE REDUCTION: This exam was performed according to the departmental dose-optimization program which includes automated exposure control, adjustment of the mA and/or kV according to patient size and/or use of iterative reconstruction technique. CONTRAST:  75mL OMNIPAQUE IOHEXOL 350 MG/ML SOLN COMPARISON:  CT chest, abdomen and pelvis with IV contrast yesterday at 5:28 p.m. (current exam 07/29/2023 at 12:08 a.m.). FINDINGS: Cardiovascular: There is a small embolic burden in the right lower lobe with nonocclusive thrombus within a posterior basal subsegmental branch likely extending into 2 of its downstream small branches. There is no evidence of right heart strain and no further embolus is seen. Bronchovascular tumor encasement narrows the distal left main pulmonary artery and it appears to almost completely occlude the left upper lobe pulmonary artery, and occludes the left superior pulmonary vein. Pulmonary trunk is upper limits of normal in caliber. Cardiac size is normal. There is a small pericardial effusion. There is no venous dilatation. Aortic and three-vessel coronary artery atherosclerosis is present but no aneurysm, stenosis or dissection, with scattered plaque in the great vessels. Mediastinum/Nodes: Large left upper lobe mass encases and obscures the hilar  structures for the most part. There is undoubtedly left hilar adenopathy but it is difficult to separate from the mass. Multifocal mediastinal adenopathy is seen with subcarinal nodal mass 4.3 x 2.6 cm on 5:74, right paratracheal adenopathy with multiple lymph nodes enlarged for example measuring 2 x 1.9 cm on 5:48, enlarged right hilar nodes measuring as much as 2.4 x 3.1 cm on 5:71, and left axillary adenopathy with the largest of these lymph nodes measuring 2.3 x 2.0 cm on 5:53. The trachea and main bronchi are patent. Unremarkable thoracic esophagus. Lungs/Pleura: Large heterogeneous left upper lobe mass obstructs the left upper lobe bronchus, with no visible remaining aeration in the left upper lobe. Scattered foci  of air in the apical portion of the mass could indicate tumoral necrosis or residual air bronchograms versus infectious complication. In total the mass measures 12.7 cm AP on 5:53, 9 cm transverse on 5:44, and 14.6 cm in height. Small layering left and minimal layering right pleural effusions are also again noted as well as multiple right lung nodules consistent with metastases. There is bilateral bronchial thickening but it is proportionately greater in the left lower lobe where there is peribronchovascular interstitial and airspace opacities which could indicate changes of bronchopneumonia or lymphangitic carcinomatosis. Multiple micronodules are noted in this distribution. Examples of some of the larger right lung metastases include a 1.1 cm nodule in the right upper lobe on 6:46, a right lower lobe superior segment nodule measuring 1.4 cm on 6:51, and a 1.3 cm right lower lobe nodule on 6:119. Others are smaller. There are mild centrilobular and paraseptal emphysematous changes in the right upper lobe. Upper Abdomen: No upper abdominal metastasis is seen. Musculoskeletal: Thoracic spondylosis. Partially visible lower cervical ACDF plating. Multilevel thoracic degenerative disc disease. No  destructive bone lesions. Review of the MIP images confirms the above findings. IMPRESSION: 1. Small subsegmental embolic burden in the right lower lobe, with no evidence of right heart strain. 2. Large 12.7 x 9 x 14.6 cm left upper lobe mass with bronchovascular encasement, occluding the left upper lobe bronchus and almost completely occluding the left upper lobe pulmonary artery, and occluding the left superior pulmonary vein. 3. Multiple right lung nodules consistent with metastases. 4. Bilateral bronchial thickening but proportionately greater in the left lower lobe where there is peribronchovascular interstitial and airspace opacities which could indicate changes of bronchopneumonia or lymphangitic carcinomatosis. 5. Small left and minimal right pleural effusions. 6. Multifocal mediastinal, right hilar and left axillary adenopathy. 7. Aortic and coronary artery atherosclerosis. 8. Emphysema. 9. These results will be called to the ordering clinician or representative by the Radiologist Assistant, and communication documented in the PACS or [employer]. Aortic Atherosclerosis (ICD10-I70.0) and Emphysema (ICD10-J43.9). Electronically Signed   By: Almira Bar M.D.   On: 07/29/2023 00:42   MR Lumbar Spine W Wo Contrast Result Date: 07/28/2023 CLINICAL DATA:  Initial evaluation for acute low back pain EXAM: MRI LUMBAR SPINE WITHOUT AND WITH CONTRAST TECHNIQUE: Multiplanar and multiecho pulse sequences of the lumbar spine were obtained without and with intravenous contrast. CONTRAST:  10mL GADAVIST GADOBUTROL 1 MMOL/ML IV SOLN COMPARISON:  Prior CT from earlier the same day. FINDINGS: Segmentation: Standard. Lowest well-formed disc space labeled the L5-S1 level. Alignment: 2 mm degenerative retrolisthesis of L1 on L2 and L2 on L3, with 3 mm anterolisthesis of L5 on S1. Vertebrae: Abnormal marrow replacement with edema and heterogeneous enhancement seen within the L1 vertebral body, concerning for osseous  metastatic disease. Associated mild pathologic compression fracture measures up to 20% without bony retropulsion. No extra osseous or epidural extension of tumor. No other visible metastatic lesions within the lumbar spine. Bone marrow signal intensity otherwise normal. Prominent degenerative reactive endplate changes noted about the L2-3 interspace. No other abnormal marrow edema or enhancement. Conus medullaris and cauda equina: Conus extends to the T12-L1 no abnormal enhancement. Level. Conus and cauda equina appear normal. Paraspinal and other soft tissues: Changes of acute pyelonephritis involving the left greater than right kidneys noted, described on prior CT. 5.3 cm simple right renal cyst, benign in appearance, no follow-up imaging recommended. Disc levels: T12-L1: Mild diffuse disc bulge with reactive endplate spurring. Mild facet hypertrophy. No spinal stenosis.  Foramina remain patent. L1-2: Degenerative intervertebral disc space narrowing with diffuse disc bulge and disc desiccation. Reactive endplate spurring. Mild bilateral facet hypertrophy. Resultant mild narrowing of the lateral recesses bilaterally. Central canal remains patent. Foramina remain adequately patent bilaterally. L2-3: Advanced degenerative intervertebral disc space narrowing with diffuse disc bulge and disc desiccation. Prominent reactive endplate change with marginal endplate osteophytic spurring and marrow edema. Mild to moderate bilateral facet arthrosis. Resultant moderate canal with bilateral subarticular stenosis. Moderate left worse than right L2 foraminal narrowing. L3-4: Diffuse disc bulge with reactive endplate spurring. Superimposed small left foraminal disc protrusion contacts the exiting left L3 nerve root (series 9, image 12). Mild-to-moderate bilateral facet hypertrophy. Resultant mild narrowing of the lateral recesses bilaterally. Central canal remains patent. Mild to moderate left L3 foraminal stenosis. Right neural  foramen remains patent. L4-5: Disc desiccation with diffuse disc bulge. Reactive endplate spurring. Moderate to severe bilateral facet arthrosis with associated small reactive joint effusions. Resultant mild to moderate bilateral subarticular stenosis. Central canal remains patent. Moderate to severe bilateral L4 foraminal narrowing. L5-S1: Anterolisthesis with degenerative intervertebral disc space narrowing. Diffuse disc bulge with reactive endplate spurring. Moderate bilateral facet hypertrophy. No significant spinal stenosis. Moderate bilateral L5 foraminal narrowing. IMPRESSION: 1. Abnormal marrow replacement with heterogeneous enhancement within the L1 vertebral body, concerning for osseous metastatic disease. Associated mild pathologic compression fracture measures up to 20% without bony retropulsion. No extra osseous or epidural extension of tumor. 2. No other evidence for metastatic disease within the lumbar spine. 3. Multilevel lumbar spondylosis with resultant moderate canal stenosis at L2-3. Associated multilevel foraminal narrowing as above, moderate to severe in nature at L4-5 bilaterally. 4. Changes of acute pyelonephritis involving the left greater than right kidneys, described on prior CT. Electronically Signed   By: Rise Mu M.D.   On: 07/28/2023 23:59   MR BRAIN W WO CONTRAST Result Date: 07/28/2023 CLINICAL DATA:  Initial evaluation for brain/CNS neoplasm EXAM: MRI HEAD WITHOUT AND WITH CONTRAST TECHNIQUE: Multiplanar, multiecho pulse sequences of the brain and surrounding structures were obtained without and with intravenous contrast. CONTRAST:  10mL GADAVIST GADOBUTROL 1 MMOL/ML IV SOLN COMPARISON:  Prior CT from 07/28/2023. FINDINGS: Brain: Cerebral volume within normal limits. Patchy T2/FLAIR hyperintensity involving the periventricular and deep white matter both supra hemispheres, consistent with chronic small vessel ischemic disease, moderately advanced in nature. Punctate  acute ischemic nonhemorrhagic infarct present at the right parietal cortex (series 9, image 98). No other evidence for acute or subacute ischemia. Mixed cystic and solid peripherally enhancing mass positioned at the left occipital lobe is seen, measuring 4.0 x 3.5 x 3.7 cm. Mild surrounding vasogenic edema without significant regional mass effect or midline shift. 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 No other visible mass lesions or abnormal enhancement. No hydrocephalus or extra-axial fluid collection. Pituitary gland suprasellar region within normal limits. Vascular: Major intracranial vascular flow voids are maintained. Skull and upper cervical spine: Craniocervical junction within normal limits. Visualized bone marrow signal intensity within normal limits. Postoperative changes noted within the partially visualized upper cervical spine. Sinuses/Orbits: Globes and orbital soft tissues within normal limits. Paranasal sinuses are largely clear. No significant mastoid effusion. Other: None. IMPRESSION: 1. 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 Mild surrounding vasogenic edema without significant regional mass effect or midline shift. 2. Punctate acute ischemic nonhemorrhagic infarct at the  right parietal cortex. 3. Underlying moderately advanced chronic microvascular ischemic disease. Electronically Signed   By: Rise Mu M.D.   On: 07/28/2023 23:48   CT L-SPINE NO CHARGE Result Date: 07/28/2023 CLINICAL DATA:  Back pain after fall December 1st EXAM: CT LUMBAR SPINE WITHOUT CONTRAST TECHNIQUE: Multidetector CT imaging of the lumbar spine was performed without intravenous contrast administration. Multiplanar CT image reconstructions were also generated. RADIATION DOSE REDUCTION: This exam was performed according to the  departmental dose-optimization program which includes automated exposure control, adjustment of the mA and/or kV according to patient size and/or use of iterative reconstruction technique. COMPARISON:  CT 04/05/2016 FINDINGS: Segmentation: 5 lumbar type vertebrae. Alignment: No evidence of traumatic listhesis. Mild grade 1 retrolisthesis of L2 is likely chronic. Vertebrae: Mild superior endplate compression of L1, may be chronic though is age indeterminate without recent comparison. Otherwise no evidence of acute fracture. There is mild heterogeneity of the marrow of L1 and metastasis is not excluded. Consider MRI for further evaluation. Paraspinal and other soft tissues: See separate report for findings in the abdomen and pelvis. Disc levels: Multilevel spondylosis and disc space height loss with degenerative endplate changes greatest at L2-L3 where it is advanced. Mild multilevel facet arthropathy. Spinal canal narrowing is greatest at L2-L3 where it is moderate. IMPRESSION: 1. Mild superior endplate compression of L1 may be chronic though is age indeterminate without recent comparison. 2. Heterogeneity of the marrow of L1. This may be related to chronic changes however metastasis is not excluded. Consider MRI for further evaluation. \ 3. Advanced disc space height loss at L2-L3. 4. See separate report for findings in the chest, abdomen, and pelvis. Electronically Signed   By: Minerva Fester M.D.   On: 07/28/2023 18:50   CT CHEST ABDOMEN PELVIS W CONTRAST Result Date: 07/28/2023 CLINICAL DATA:  Altered mental status for 1 week and back pain after a fall on December 1st. EXAM: CT CHEST, ABDOMEN, AND PELVIS WITH CONTRAST TECHNIQUE: Multidetector CT imaging of the chest, abdomen and pelvis was performed following the standard protocol during bolus administration of intravenous contrast. RADIATION DOSE REDUCTION: This exam was performed according to the departmental dose-optimization program which includes  automated exposure control, adjustment of the mA and/or kV according to patient size and/or use of iterative reconstruction technique. CONTRAST:  75mL OMNIPAQUE IOHEXOL 350 MG/ML SOLN COMPARISON:  CT abdomen and pelvis 01/14/2008 and chest radiographs 12/07/2014 FINDINGS: CT CHEST FINDINGS Cardiovascular: Normal heart size. Small pericardial effusion. Pericardial thickening where it abuts the large left suprahilar mass. Coronary artery and aortic atherosclerotic calcification. The left upper lobe pulmonary arteries are not well visualized and may be compressed or occluded by the mass. Mediastinum/Nodes: Large heterogenous mass in the left suprahilar region extending into the left upper lobe. Mediastinal, bilateral hilar, and left axillary lymphadenopathy. For example a subcarinal node measures 2.5 cm (series 3/image 27); 1.3 cm right hilar node on series 3/image 34; and a left axillary node measures 2.2 cm (3/19). Trachea and esophagus are unremarkable. Lungs/Pleura: Large heterogenous mass left upper lobe/suprahilar mass consistent with malignancy measuring 12.1 x 8.7 by 12.1 cm. There are a few small locules of gas in the superior portion of the mass which may represent cavitation. The left upper lobe bronchus is occluded by the mass. Diffuse bronchial wall thickening in the lingula and left lower lobe with centrilobular micro nodules and tree-in-bud opacities. 1.6 cm subpleural nodule in the anterior lingula (series 4/image 90). Small left pleural effusion. Multiple pulmonary nodules in the right  lung consistent with metastases. For example 12 mm nodule in the posterior right lower lobe (4/128) and 9 mm nodule in the right upper lobe (4/44). Mild emphysema in the right. No right pleural effusion. No pneumothorax. Musculoskeletal: No acute fracture.  No destructive osseous lesion. CT ABDOMEN PELVIS FINDINGS Hepatobiliary: No focal hepatic lesion. Layering sludge in the gallbladder. No biliary dilation or evidence  of cholecystitis. Pancreas: Unremarkable. Spleen: Unremarkable. Adrenals/Urinary Tract: Normal adrenal glands. Heterogenous attenuation of the left kidney. Focal geographic hypoattenuation in the posterior right kidney. Mild asymmetric left-greater-than-right perinephric stranding. Findings are compatible with pyelonephritis. No urinary calculi or hydronephrosis. Unremarkable bladder. Stomach/Bowel: Normal caliber large and small bowel without bowel wall thickening. Stomach and appendix are within normal limits. Colonic diverticulosis without diverticulitis. Vascular/Lymphatic: Aortic atherosclerosis. 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 The stent in the right superficial femoral artery is not definitively opacified and may be occluded. No enlarged abdominal or pelvic lymph nodes. Reproductive: No acute abnormality. Other: No free intraperitoneal fluid or air. Musculoskeletal: No acute fracture or destructive osseous lesion in the pelvis. See separate report for findings in the lumbar spine. IMPRESSION: CHEST: 1. Large heterogenous mass in the left upper lobe/suprahilar region consistent with malignancy. 2. Diffuse bronchial wall thickening in the lingula and left lower lobe. Differential considerations include lymphangitic spread of tumor or airway infection/inflammation. 3. Multiple pulmonary nodules in the right lung consistent with metastases. 4. Mediastinal, bilateral hilar, and left axillary lymphadenopathy consistent with metastases. 5. Small left pleural effusion. ABDOMEN/PELVIS: 1. Bilateral pyelonephritis, left-greater-than-right. 2. The stent in the right superficial femoral artery is not definitively opacified and may be occluded though this is incompletely evaluated on non arterial phase exam. 3. 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 Aortic Atherosclerosis (ICD10-I70.0) and Emphysema (ICD10-J43.9). Electronically Signed   By: Minerva Fester M.D.   On: 07/28/2023 18:16   CT Head Wo Contrast Result Date: 07/28/2023 CLINICAL DATA:  Delirium EXAM: CT HEAD WITHOUT CONTRAST TECHNIQUE: Contiguous axial images were obtained from the base of the skull through the vertex without intravenous contrast. RADIATION DOSE REDUCTION: This exam was performed according to the departmental dose-optimization program which includes automated exposure control, adjustment of the mA and/or kV according to patient size and/or use of iterative reconstruction technique. COMPARISON:  CT of the head December 07, 2014. FINDINGS: Brain: Complex cystic and solid 3.4 cm mass in the left parietal lobe. This lesion has some mild hyperdensity associated with it which could represent mineralization and or hemorrhage. Surrounding edema and mild regional mass effect without midline shift. No evidence of acute large vascular territory infarct, mass occupying acute hemorrhage outside the above lesion, midline shift or hydrocephalus. Vascular: No hyperdense vessel identified. Skull: Normal. Negative for fracture or focal lesion. Sinuses/Orbits: Mostly clear sinuses.  No acute orbital findings. IMPRESSION: 1. Cystic and solid 3.4 cm mass in the left parietal lobe, which is concerning for a metastasis given the findings on the forthcoming CT of the chest/abdomen/pelvis. Recommend MRI of the head with contrast to further characterize and to assess for other lesions. 2. Surrounding edema and mild regional mass effect without midline shift. Electronically Signed   By: Feliberto Harts M.D.   On: 07/28/2023 18:02    Cardiac Studies   Echo   07/29/23   1. Left ventricular ejection fraction, by estimation, is 30  to 35%. The  left ventricle has moderately decreased function. The left ventricle  demonstrates global hypokinesis. The left ventricular internal cavity size   was mildly dilated. Left ventricular  diastolic parameters are indeterminate.   2. Right ventricular systolic function is normal. The right ventricular  size is normal. There is normal pulmonary artery systolic pressure. The  estimated right ventricular systolic pressure is 25.8 mmHg.   3. The mitral valve is normal in structure. Trivial mitral valve  regurgitation. No evidence of mitral stenosis.   4. The aortic valve is tricuspid. There is mild calcification of the  aortic valve. Aortic valve regurgitation is trivial. No aortic stenosis is  present.   5. The inferior vena cava is dilated in size with <50% respiratory  variability, suggesting right atrial pressure of 15 mmHg.   6. The patient was in atrial fibrillation.    Patient Profile     Cody Pena is a 62 y.o. 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 Thedore Mins.   Assessment & Plan    1  Atrial fibrillation    Rates are much better today   Note that diltiazem was stopped  With LVEF being depressed would recomm b blocker for rate control   Can continue digoxin for now until stabilized  2  HFrEF   I have reviewed echo images  Extremely poor acoustic windows    LVEF may not be quite as low as estimated    Would recomm limited echo with Definity,  LV dysfunction may be tachy induced, though pt does have atherosclerosis on CT scan  Volume status overall appears not bad     For questions or updates, please contact [facility] Please consult [email or web address] for contact info under        Signed, Dietrich Pates, MD  07/30/2023, 8:51 AM

## 2023-07-30 NOTE — Progress Notes (Signed)
PROGRESS NOTE                                                                                                                                                                                                             Patient Demographics:    Cody Pena, is a 62 y.o. male, DOB - 1961/07/31, ZOX:[id number]  Outpatient Primary MD for the patient is [facility]    LOS - 2  Admit date - 07/28/2023    Chief Complaint  Patient presents with   Altered Mental Status   Hyperglycemia   Back Pain       Brief Narrative (HPI from H&P)    62 y.o. male with medical history significant of peripheral vascular disease s/p BKA, hypertension, insulin-dependent type 2 diabetes, and hyperlipidemia who presents with altered mental status and back pain.   Pt not able to provide much hx as he is lethargic after receiving IV morphine. He has been told of cancer in his imaging and appears depressed. Says his memory has been worse. Feels overwhelmed with his new cancer diagnosis. Denies any current pain although earlier had lower back pain. He is alert and oriented only to self although fully able to follow commands. Tells me he does not want his wife to know of his diagnoses yet.   Further workup in the ER is was suggestive of bilateral pyelonephritis with sepsis, new lung mass suspicious for malignancy with mets to spine, lymph nodes and brain, brain mass, small incidental CVA, PE, A-fib RVR and he was admitted.   Subjective:   Patient in bed, appears comfortable, denies any headache, no fever, no chest pain or pressure, no shortness of breath , no abdominal pain. No focal weakness.   Assessment  & Plan :    Sepsis (HCC)  -secondary to bilateral pyelonephritis - No signs of obstruction or hydronephrosis, continue empiric antibiotics and follow cultures, continue IV fluids, sepsis pathophysiology is improving  Paroxysmal atrial  fibrillation with RVR (HCC) -[place] vas 2 score of greater than 3.  - Newly diagnosed atrial fibrillation.  Likely related to his dehydration with hypercalcemia, sepsis and PE.  TSH stable, echo pending, on Cardizem drip, oral Cardizem, as needed IV Cardizem and IV Lopressor along with 2 doses of IV digoxin ordered.  Rate much improved cardiology on board, on heparin drip will  be switched to Eliquis in the next 1 to 2 days.  Cardiology on board as well.  TSH stable echo noted.  Chronic systolic heart failure EF 35% with global hypokinesis on echocardiogram.  Cardiology on board.  For now supportive care, defer further management to cardiology, not a candidate for invasive testing or procedures due to his metastatic cancer.    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  Case discussed with pulmonary, they will see the patient and arrange for biopsy likely bronchoscopy, oncology also consulted to see.  Long-term prognosis does not appear to be good, pulmonary and oncology following, s/p bronchoscopy morning of 07/30/2023 with biopsy, follow results.  Defer further management of malignancy to oncology team.  Brain mass  -MRI confirms brain mass with some vasogenic edema.  For now Decadron and monitor  CVA (cerebral vascular accident) (HCC) - MRI brain imaging resulted demonstrating mixed cystic and solid enhancing mass to the left occiput lobe.  There was also a punctate acute ischemic nonhemorrhagic infarct at the right parietal cortex.  Case discussed with Dr. Pearlean Brownie stroke team on 07/29/2023 in detail, cleared for heparin without bolus for underlying PE and A-fib.  Basic stroke workup however no significant deficit from stroke standpoint this is likely incidental.  Acute pulmonary embolism (HCC)   - CTA noted, currently see discussions about heparin with stroke team above.  Heparin without bolus with caution and monitor.  Back pain  -  MRI of the  L-spine shows suspicion for possible metastatic malignancy in the spine.  Supportive care for now.  Hypercalcemia  - getting IV fluids, calcitonin, oncology also ordered Zometa, will check PTH and PTH RP, monitor trend likely will improve in the next 1 to 2 days after Zometa.  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  Patient vascular surgery follow-up.  S/P BKA (below knee amputation), right (HCC)  History of PAD, There is question of possible occluded right superior femoral artery stent on CT abdomen/pelvis but patient denies any claudication or pain, monitor clinically.  Outpatient vascular surgery follow-up.  T2DM (type 2 diabetes mellitus) (HCC)  - insulin-dependent T2DM , keep on resistant SSI while on steroids for cerebral edema, Semglee added  Lab Results  Component Value Date   HGBA1C 9.9 (H) 07/28/2023   CBG (last 3)  Recent Labs    07/29/23 1619 07/29/23 2116 07/30/23 0822  GLUCAP 208* 171* 204*           Condition - Extremely Guarded  Family Communication  : Called daughter on 07/29/2023 at 9 AM, no response.  Patient does not want wife to be called  Code Status : Full code  Consults  : Pulmonary, oncology, cardiology, neurology  PUD Prophylaxis :     Procedures  :     TTE.  1. Left ventricular ejection fraction, by estimation, is 30 to 35%. The left ventricle has moderately decreased function. The left ventricle demonstrates global hypokinesis. The left ventricular internal cavity size was mildly dilated. Left ventricular diastolic parameters are indeterminate.  2. Right ventricular systolic function is normal. The right ventricular size is normal. There is normal pulmonary artery systolic pressure. The estimated right ventricular systolic pressure is 25.8 mmHg.  3. The mitral valve is normal in structure. Trivial mitral valve regurgitation. No evidence of  mitral stenosis.  4. The aortic valve is tricuspid.  There is mild calcification of the aortic valve. Aortic valve regurgitation is trivial. No aortic stenosis is present.  5. The inferior vena cava is dilated in size with <50% respiratory variability, suggesting right atrial pressure of 15 mmHg.  6. The patient was in atrial fibrillation  EEG - no seizures  CT chest abdomen pelvis.  CHEST: 1. Large heterogenous mass in the left upper lobe/suprahilar region consistent with malignancy. 2. Diffuse bronchial wall thickening in the lingula and left lower lobe. Differential considerations include lymphangitic spread of tumor or airway infection/inflammation. 3. Multiple pulmonary nodules in the right lung consistent with metastases. 4. Mediastinal, bilateral hilar, and left axillary lymphadenopathy consistent with metastases. 5. Small left pleural effusion. ABDOMEN/PELVIS: 1. Bilateral pyelonephritis, left-greater-than-right. 2. The stent in the right superficial femoral artery is not definitively opacified and may be occluded though this is incompletely evaluated on non arterial phase exam. 3. 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 Aortic Atherosclerosis    CTA chest.   1. Small subsegmental embolic burden in the right lower lobe, with no evidence of right heart strain. 2. Large 12.7 x 9 x 14.6 cm left upper lobe mass with bronchovascular encasement, occluding the left upper lobe bronchus and almost completely occluding the left upper lobe pulmonary artery, and occluding the left superior pulmonary vein. 3. Multiple right lung nodules consistent with metastases. 4. Bilateral bronchial thickening but proportionately greater in the left lower lobe where there is peribronchovascular interstitial and airspace opacities which could indicate changes of bronchopneumonia or lymphangitic carcinomatosis. 5. Small left and minimal  right pleural effusions. 6. Multifocal mediastinal, right hilar and left axillary adenopathy. 7. Aortic and coronary artery atherosclerosis. 8. Emphysema. 9. These results will be called to the ordering clinician or representative by the Radiologist Assistant, and communication documented in the PACS or [employer]. Aortic Atherosclerosis  and Emphysema   MR L Spine - 1. Abnormal marrow replacement with heterogeneous enhancement within the L1 vertebral body, concerning for osseous metastatic disease. Associated mild pathologic compression fracture measures up to 20% without bony retropulsion. No extra osseous or epidural extension of tumor. 2. No other evidence for metastatic disease within the lumbar spine. 3. Multilevel lumbar spondylosis with resultant moderate canal stenosis at L2-3. Associated multilevel foraminal narrowing as above, moderate to severe in nature at L4-5 bilaterally. 4. Changes of acute pyelonephritis involving the left greater than right kidneys, described on prior CT.   MRI brain. 1. 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 Mild surrounding vasogenic edema without significant regional mass effect or midline shift. 2. 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 3. Underlying moderately advanced chronic microvascular ischemic disease      Disposition Plan  :    Status is: Inpatient  DVT Prophylaxis  :    SCDs Start: 07/28/23 2115 .Marland Kitchen Heparin    Lab Results  Component Value Date   PLT 448 (H) 07/30/2023    Diet :  Diet Order             Diet NPO time specified  Diet effective midnight                    Inpatient Medications  Scheduled Meds:  [MAR Hold] aspirin EC  81 mg Oral Daily   [MAR Hold] atorvastatin  10 mg Oral QPM   [MAR Hold] dexamethasone (DECADRON) injection  4 mg Intravenous QHS   [MAR Hold]  digoxin  0.25 mg Oral Daily   [MAR Hold] diltiazem  30 mg Oral Q8H   [MAR Hold] insulin aspart  0-20 Units Subcutaneous TID PC & HS   [MAR Hold] insulin glargine-yfgn  10 Units Subcutaneous Daily   [MAR Hold] metoprolol tartrate  5 mg Intravenous Q6H   [MAR Hold] metoprolol tartrate  50 mg Oral QID   Continuous Infusions:  sodium chloride 100 mL/hr at 07/30/23 0624   [MAR Hold] ceFEPime (MAXIPIME) IV 2 g (07/30/23 0343)   PRN Meds:.[MAR Hold] diltiazem, [MAR Hold] haloperidol lactate, [MAR Hold] HYDROcodone-acetaminophen, [MAR Hold] morphine  Antibiotics  :    Anti-infectives (From admission, onward)    Start     Dose/Rate Route Frequency Ordered Stop   07/29/23 0200  [MAR Hold]  ceFEPIme (MAXIPIME) 2 g in sodium chloride 0.9 % 100 mL IVPB        (MAR Hold since Tue 07/30/2023 at 0915.Hold Reason: Transfer to a Procedural area)   2 g 200 mL/hr over 30 Minutes Intravenous Every 8 hours 07/28/23 2125     07/28/23 1830  vancomycin (VANCOCIN) IVPB 1000 mg/200 mL premix       Placed in "Followed by" Linked Group   1,000 mg 200 mL/hr over 60 Minutes Intravenous  Once 07/28/23 1719 07/28/23 2121   07/28/23 1730  ceFEPIme (MAXIPIME) 2 g in sodium chloride 0.9 % 100 mL IVPB        2 g 200 mL/hr over 30 Minutes Intravenous  Once 07/28/23 1719 07/28/23 1850   07/28/23 1730  vancomycin (VANCOCIN) IVPB 1000 mg/200 mL premix       Placed in "Followed by" Linked Group   1,000 mg 200 mL/hr over 60 Minutes Intravenous  Once 07/28/23 1719 07/28/23 2013         Objective:   Vitals:   07/30/23 0400 07/30/23 0800 07/30/23 0925 07/30/23 0935  BP: 116/79 120/74 125/78 113/71  Pulse: 75 77 97 81  Resp: 13 20 20  (!) 21  Temp: 98.3 F (36.8 C) (!) 97.2 F (36.2 C) (!) 97.4 F (36.3 C)   TempSrc: Oral Oral Temporal   SpO2: 93%  91% 94%  Weight:   77.7 kg   Height:   6\' 3"  (1.905 m)     Wt Readings from Last 3 Encounters:  07/30/23 77.7 kg  01/06/23 97.5 kg  01/31/22 104.3 kg      Intake/Output Summary (Last 24 hours) at 07/30/2023 1047 Last data filed at 07/30/2023 1033 Gross per 24 hour  Intake 1925.57 ml  Output --  Net 1925.57 ml     Physical Exam  Awake Alert, No new F.N deficits, Normal affect [place].AT,PERRAL Supple Neck, No JVD,   Symmetrical Chest wall movement, Good air movement bilaterally, CTAB RRR,No Gallops,Rubs or new Murmurs,  +ve B.Sounds, Abd Soft, No tenderness,   Right BKA    Data Review:    Recent Labs  Lab 07/28/23 1548 07/29/23 0422 07/30/23 0422  WBC 18.6* 18.7* 18.2*  HGB 13.0 11.3* 11.1*  HCT 40.9 35.7* 35.5*  PLT 515* 391 448*  MCV 83.1 83.2 84.5  MCH 26.4 26.3 26.4  MCHC 31.8 31.7 31.3  RDW 14.3 14.4 14.5  LYMPHSABS 1.6  --  0.8  MONOABS 1.3*  --  0.6  EOSABS 0.1  --  0.0  BASOSABS 0.1  --  0.1    Recent  Labs  Lab 07/28/23 1548 07/28/23 1553 07/28/23 1801 07/28/23 2304 07/29/23 0422 07/29/23 0942 07/30/23 0422  NA 132*  --   --   --  130*  --  131*  K 4.5  --   --   --  4.5  --  4.3  CL 95*  --   --   --  97*  --  101  CO2 24  --   --   --  24  --  22  ANIONGAP 13  --   --   --  9  --  8  GLUCOSE 228*  --   --   --  189*  --  236*  BUN 32*  --   --   --  30*  --  32*  CREATININE 1.01  --   --   --  1.19  --  0.90  AST 21  --   --   --  15  --  19  ALT 13  --   --   --  11  --  13  ALKPHOS 95  --   --   --  85  --  82  BILITOT 0.9  --   --   --  0.6  --  0.6  ALBUMIN 2.2*  --   --   --  2.0*  --  2.0*  CRP  --   --   --   --   --  18.1* 11.0*  PROCALCITON  --   --   --   --   --  <0.10 <0.10  LATICACIDVEN  --  3.7* 2.0* 1.6  --   --   --   TSH  --   --   --   --  1.533  --   --   HGBA1C  --   --   --  9.9*  --   --   --   BNP  --   --   --   --   --  250.4* 269.7*  MG  --   --   --   --   --  1.9 2.0  CALCIUM 12.3*  --   --   --  12.3*  --  11.9*      Recent Labs  Lab 07/28/23 1548 07/28/23 1553 07/28/23 1801 07/28/23 2304 07/29/23 0422 07/29/23 0942 07/30/23 0422  CRP  --   --    --   --   --  18.1* 11.0*  PROCALCITON  --   --   --   --   --  <0.10 <0.10  LATICACIDVEN  --  3.7* 2.0* 1.6  --   --   --   TSH  --   --   --   --  1.533  --   --   HGBA1C  --   --   --  9.9*  --   --   --   BNP  --   --   --   --   --  250.4* 269.7*  MG  --   --   --   --   --  1.9 2.0  CALCIUM 12.3*  --   --   --  12.3*  --  11.9*    --------------------------------------------------------------------------------------------------------------- Lab Results  Component Value Date   CHOL 131 07/24/2021   HDL 28 (L) 07/24/2021   LDLCALC 48 07/24/2021   TRIG 273 (H) 07/24/2021  CHOLHDL 4.7 07/24/2021    Lab Results  Component Value Date   HGBA1C 9.9 (H) 07/28/2023   Recent Labs    07/29/23 0422  TSH 1.533   No results for input(s): "VITAMINB12", "FOLATE", "FERRITIN", "TIBC", "IRON", "RETICCTPCT" in the last 72 hours. ------------------------------------------------------------------------------------------------------------------ Cardiac Enzymes No results for input(s): "CKMB", "TROPONINI", "MYOGLOBIN" in the last 168 hours.  Invalid input(s): "CK"  Micro Results Recent Results (from the past 240 hours)  Blood culture (routine x 2)     Status: None (Preliminary result)   Collection Time: 07/28/23  3:48 PM   Specimen: BLOOD RIGHT ARM  Result Value Ref Range Status   Specimen Description BLOOD RIGHT ARM  Final   Special Requests   Final    BOTTLES DRAWN AEROBIC AND ANAEROBIC Blood Culture adequate volume   Culture   Final    NO GROWTH 2 DAYS Performed at [facility], [address] [place], [place] [address]    Report Status PENDING  Incomplete  Blood culture (routine x 2)     Status: None (Preliminary result)   Collection Time: 07/28/23  3:48 PM   Specimen: BLOOD LEFT ARM  Result Value Ref Range Status   Specimen Description BLOOD LEFT ARM  Final   Special Requests   Final    BOTTLES DRAWN AEROBIC AND ANAEROBIC Blood Culture adequate volume   Culture    Final    NO GROWTH 2 DAYS Performed at [facility], [address], [place], [place] [address]    Report Status PENDING  Incomplete    Radiology Reports VAS [place] LOWER EXTREMITY VENOUS (DVT) Result Date: 07/29/2023  Lower Venous DVT Study Patient Name:  Cody Pena  Date of Exam:   07/29/2023 Medical Rec #: [id number]        Accession #:    [id number] Date of Birth: May 14, 1961       Patient Gender: M Patient Age:   63 years Exam Location:  [facility] Procedure:      VAS [place] LOWER EXTREMITY VENOUS (DVT) Referring Phys: CHING TU --------------------------------------------------------------------------------  Indications: Pulmonary embolism.  Risk Factors: Confirmed PE Surgery RT BKA. Anticoagulation: Heparin. Comparison Study: No significant changes seen since prior exam 07/24/21 Performing Technologist: Shona Simpson  Examination Guidelines: A complete evaluation includes B-mode imaging, spectral Doppler, color Doppler, and power Doppler as needed of all accessible portions of each vessel. Bilateral testing is considered an integral part of a complete examination. Limited examinations for reoccurring indications may be performed as noted. The reflux portion of the exam is performed with the patient in reverse Trendelenburg.  +---------+---------------+---------+-----------+----------+--------------+ RIGHT    CompressibilityPhasicitySpontaneityPropertiesThrombus Aging +---------+---------------+---------+-----------+----------+--------------+ CFV      Full           Yes      Yes                                 +---------+---------------+---------+-----------+----------+--------------+ SFJ      Full                                                        +---------+---------------+---------+-----------+----------+--------------+ FV Prox  Full                                                         +---------+---------------+---------+-----------+----------+--------------+  FV Mid   Full                                                        +---------+---------------+---------+-----------+----------+--------------+ FV DistalFull                                                        +---------+---------------+---------+-----------+----------+--------------+ PFV      Full                                                        +---------+---------------+---------+-----------+----------+--------------+ POP      Full           Yes      Yes                                 +---------+---------------+---------+-----------+----------+--------------+ PTV      Full                                                        +---------+---------------+---------+-----------+----------+--------------+ PERO     Full                                                        +---------+---------------+---------+-----------+----------+--------------+   +---------+---------------+---------+-----------+----------+--------------+ LEFT     CompressibilityPhasicitySpontaneityPropertiesThrombus Aging +---------+---------------+---------+-----------+----------+--------------+ CFV      Full           Yes      Yes                                 +---------+---------------+---------+-----------+----------+--------------+ SFJ      Full                                                        +---------+---------------+---------+-----------+----------+--------------+ FV Prox  Full                                                        +---------+---------------+---------+-----------+----------+--------------+ FV Mid   Full                                                        +---------+---------------+---------+-----------+----------+--------------+  FV DistalFull                                                         +---------+---------------+---------+-----------+----------+--------------+ PFV      Full                                                        +---------+---------------+---------+-----------+----------+--------------+ POP      Full           Yes      Yes                                 +---------+---------------+---------+-----------+----------+--------------+ PTV      Full                                                        +---------+---------------+---------+-----------+----------+--------------+ PERO     Full                                                        +---------+---------------+---------+-----------+----------+--------------+     Summary: BILATERAL: - No evidence of deep vein thrombosis seen in the lower extremities, bilaterally. -No evidence of popliteal cyst, bilaterally.   *See table(s) above for measurements and observations. Electronically signed by Gerarda Fraction on 07/29/2023 at 6:50:19 PM.    Final    ECHOCARDIOGRAM COMPLETE Result Date: 07/29/2023    ECHOCARDIOGRAM REPORT   Patient Name:   Cody Pena Date of Exam: 07/29/2023 Medical Rec #:  [id number]       Height:       75.0 in Accession #:    [id number]      Weight:       171.3 lb Date of Birth:  21-Aug-1960      BSA:          2.055 m Patient Age:    62 years        BP:           140/117 mmHg Patient Gender: M               HR:           122 bpm. Exam Location:  Inpatient Procedure: 2D Echo, Cardiac Doppler and Color Doppler Indications:    Atrial Fibrillation I48.91  History:        Patient has no prior history of Echocardiogram examinations.                 Arrythmias:Atrial Fibrillation, Signs/Symptoms:Dyspnea; Risk                 Factors:Hypertension, Diabetes, Current Smoker and Dyslipidemia.  Sonographer:    Lucendia Herrlich RCS Referring Phys: [id number] CHING T TU  Sonographer Comments: Image acquisition challenging due to uncooperative patient. IMPRESSIONS  1. Left ventricular ejection  fraction, by estimation, is 30 to 35%. The left ventricle has moderately decreased function. The left ventricle demonstrates global hypokinesis. The left ventricular internal cavity size was mildly dilated. Left ventricular diastolic parameters are indeterminate.  2. Right ventricular systolic function is normal. The right ventricular size is normal. There is normal pulmonary artery systolic pressure. The estimated right ventricular systolic pressure is 25.8 mmHg.  3. The mitral valve is normal in structure. Trivial mitral valve regurgitation. No evidence of mitral stenosis.  4. The aortic valve is tricuspid. There is mild calcification of the aortic valve. Aortic valve regurgitation is trivial. No aortic stenosis is present.  5. The inferior vena cava is dilated in size with <50% respiratory variability, suggesting right atrial pressure of 15 mmHg.  6. The patient was in atrial fibrillation. FINDINGS  Left Ventricle: Left ventricular ejection fraction, by estimation, is 30 to 35%. The left ventricle has moderately decreased function. The left ventricle demonstrates global hypokinesis. The left ventricular internal cavity size was mildly dilated. There is no left ventricular hypertrophy. Left ventricular diastolic parameters are indeterminate. Right Ventricle: The right ventricular size is normal. No increase in right ventricular wall thickness. Right ventricular systolic function is normal. There is normal pulmonary artery systolic pressure. The tricuspid regurgitant velocity is 1.64 m/s, and  with an assumed right atrial pressure of 15 mmHg, the estimated right ventricular systolic pressure is 25.8 mmHg. Left Atrium: Left atrial size was normal in size. Right Atrium: Right atrial size was normal in size. Pericardium: There is no evidence of pericardial effusion. Mitral Valve: The mitral valve is normal in structure. There is mild thickening of the mitral valve leaflet(s). Trivial mitral valve regurgitation. No  evidence of mitral valve stenosis. Tricuspid Valve: The tricuspid valve is normal in structure. Tricuspid valve regurgitation is trivial. Aortic Valve: The aortic valve is tricuspid. There is mild calcification of the aortic valve. Aortic valve regurgitation is trivial. No aortic stenosis is present. Aortic valve peak gradient measures 9.7 mmHg. Pulmonic Valve: The pulmonic valve was normal in structure. Pulmonic valve regurgitation is not visualized. Aorta: The aortic root is normal in size and structure. Venous: The inferior vena cava is dilated in size with less than 50% respiratory variability, suggesting right atrial pressure of 15 mmHg. IAS/Shunts: No atrial level shunt detected by color flow Doppler.  LEFT VENTRICLE PLAX 2D LVIDd:         5.50 cm   Diastology LVIDs:         4.60 cm   LV e' medial:    9.64 cm/s LV PW:         1.00 cm   LV E/e' medial:  10.8 LV IVS:        0.90 cm   LV e' lateral:   14.10 cm/s LVOT diam:     2.10 cm   LV E/e' lateral: 7.4 LV SV:         62 LV SV Index:   30 LVOT Area:     3.46 cm  RIGHT VENTRICLE             IVC RV S prime:     15.50 cm/s  IVC diam: 2.40 cm TAPSE (M-mode): 2.1 cm LEFT ATRIUM         Index LA diam:    3.90 cm 1.90 cm/m  AORTIC VALVE AV Area (Vmax): 2.73 cm AV Vmax:        155.50 cm/s AV Peak Grad:   9.7  mmHg LVOT Vmax:      122.67 cm/s LVOT Vmean:     80.267 cm/s LVOT VTI:       0.180 m  AORTA Ao Root diam: 3.70 cm Ao Asc diam:  3.60 cm MITRAL VALVE                TRICUSPID VALVE MV Area (PHT): 5.84 cm     TR Peak grad:   10.8 mmHg MV Decel Time: 130 msec     TR Vmax:        164.00 cm/s MV E velocity: 104.00 cm/s MV A velocity: 70.50 cm/s   SHUNTS MV E/A ratio:  1.48         Systemic VTI:  0.18 m                             Systemic Diam: 2.10 cm Dalton McleanMD Electronically signed by Wilfred Lacy Signature Date/Time: 07/29/2023/5:47:50 PM    Final    EEG adult Result Date: 07/29/2023 Charlsie Quest, MD     07/29/2023 11:52 AM Patient Name:  Cody Pena MRN: [id number] Epilepsy Attending: Charlsie Quest Referring Physician/Provider: Caryl Pina, MD Date: 07/29/2023 Duration: 25.56 mins Patient history: 62 yo M presented to the ED yesterday afternoon with a one week history of AMS and back pain. EEG to evaluate for seizure Level of alertness: Awake AEDs during EEG study: None Technical aspects: This EEG study was done with scalp electrodes positioned according to the 10-20 International system of electrode placement. Electrical activity was reviewed with band pass filter of 1-70Hz , sensitivity of 7 uV/mm, display speed of 75mm/sec with a 60Hz  notched filter applied as appropriate. EEG data were recorded continuously and digitally stored.  Video monitoring was available and reviewed as appropriate. Description: No posterior dominant rhythm was seen. EEG showed continuous generalized 3 to 6 Hz theta-delta slowing admixed with 12-14 Hz beta activity distributed symmetrically and diffusely.  Physiologic photic driving was not seen during photic stimulation.  Hyperventilation was not performed.   ABNORMALITY - Continuous slow, generalized IMPRESSION: This study is suggestive of moderate diffuse encephalopathy. No seizures or epileptiform discharges were seen throughout the recording. Charlsie Quest    CT Angio Chest Pulmonary Embolism (PE) W or WO Contrast Result Date: 07/29/2023 CLINICAL DATA:  Pulmonary embolism suspected. Positive D-dimer. CT yesterday demonstrating a large left upper lobe neoplasm with mediastinal and hilar adenopathy and bronchovascular encasement. EXAM: CT ANGIOGRAPHY CHEST WITH CONTRAST TECHNIQUE: Multidetector CT imaging of the chest was performed using the standard protocol during bolus administration of intravenous contrast. Multiplanar CT image reconstructions and MIPs were obtained to evaluate the vascular anatomy. RADIATION DOSE REDUCTION: This exam was performed according to the departmental dose-optimization  program which includes automated exposure control, adjustment of the mA and/or kV according to patient size and/or use of iterative reconstruction technique. CONTRAST:  75mL OMNIPAQUE IOHEXOL 350 MG/ML SOLN COMPARISON:  CT chest, abdomen and pelvis with IV contrast yesterday at 5:28 p.m. (current exam 07/29/2023 at 12:08 a.m.). FINDINGS: Cardiovascular: There is a small embolic burden in the right lower lobe with nonocclusive thrombus within a posterior basal subsegmental branch likely extending into 2 of its downstream small branches. There is no evidence of right heart strain and no further embolus is seen. Bronchovascular tumor encasement narrows the distal left main pulmonary artery and it appears to almost completely occlude the left upper lobe pulmonary artery, and occludes the left superior  pulmonary vein. Pulmonary trunk is upper limits of normal in caliber. Cardiac size is normal. There is a small pericardial effusion. There is no venous dilatation. Aortic and three-vessel coronary artery atherosclerosis is present but no aneurysm, stenosis or dissection, with scattered plaque in the great vessels. Mediastinum/Nodes: Large left upper lobe mass encases and obscures the hilar structures for the most part. There is undoubtedly left hilar adenopathy but it is difficult to separate from the mass. Multifocal mediastinal adenopathy is seen with subcarinal nodal mass 4.3 x 2.6 cm on 5:74, right paratracheal adenopathy with multiple lymph nodes enlarged for example measuring 2 x 1.9 cm on 5:48, enlarged right hilar nodes measuring as much as 2.4 x 3.1 cm on 5:71, and left axillary adenopathy with the largest of these lymph nodes measuring 2.3 x 2.0 cm on 5:53. The trachea and main bronchi are patent. Unremarkable thoracic esophagus. Lungs/Pleura: Large heterogeneous left upper lobe mass obstructs the left upper lobe bronchus, with no visible remaining aeration in the left upper lobe. Scattered foci of air in the  apical portion of the mass could indicate tumoral necrosis or residual air bronchograms versus infectious complication. In total the mass measures 12.7 cm AP on 5:53, 9 cm transverse on 5:44, and 14.6 cm in height. Small layering left and minimal layering right pleural effusions are also again noted as well as multiple right lung nodules consistent with metastases. There is bilateral bronchial thickening but it is proportionately greater in the left lower lobe where there is peribronchovascular interstitial and airspace opacities which could indicate changes of bronchopneumonia or lymphangitic carcinomatosis. Multiple micronodules are noted in this distribution. Examples of some of the larger right lung metastases include a 1.1 cm nodule in the right upper lobe on 6:46, a right lower lobe superior segment nodule measuring 1.4 cm on 6:51, and a 1.3 cm right lower lobe nodule on 6:119. Others are smaller. There are mild centrilobular and paraseptal emphysematous changes in the right upper lobe. Upper Abdomen: No upper abdominal metastasis is seen. Musculoskeletal: Thoracic spondylosis. Partially visible lower cervical ACDF plating. Multilevel thoracic degenerative disc disease. No destructive bone lesions. Review of the MIP images confirms the above findings. IMPRESSION: 1. Small subsegmental embolic burden in the right lower lobe, with no evidence of right heart strain. 2. Large 12.7 x 9 x 14.6 cm left upper lobe mass with bronchovascular encasement, occluding the left upper lobe bronchus and almost completely occluding the left upper lobe pulmonary artery, and occluding the left superior pulmonary vein. 3. Multiple right lung nodules consistent with metastases. 4. Bilateral bronchial thickening but proportionately greater in the left lower lobe where there is peribronchovascular interstitial and airspace opacities which could indicate changes of bronchopneumonia or lymphangitic carcinomatosis. 5. Small left and  minimal right pleural effusions. 6. Multifocal mediastinal, right hilar and left axillary adenopathy. 7. Aortic and coronary artery atherosclerosis. 8. Emphysema. 9. These results will be called to the ordering clinician or representative by the Radiologist Assistant, and communication documented in the PACS or [employer]. Aortic Atherosclerosis (ICD10-I70.0) and Emphysema (ICD10-J43.9). Electronically Signed   By: Almira Bar M.D.   On: 07/29/2023 00:42   MR Lumbar Spine W Wo Contrast Result Date: 07/28/2023 CLINICAL DATA:  Initial evaluation for acute low back pain EXAM: MRI LUMBAR SPINE WITHOUT AND WITH CONTRAST TECHNIQUE: Multiplanar and multiecho pulse sequences of the lumbar spine were obtained without and with intravenous contrast. CONTRAST:  10mL GADAVIST GADOBUTROL 1 MMOL/ML IV SOLN COMPARISON:  Prior CT from earlier the  same day. FINDINGS: Segmentation: Standard. Lowest well-formed disc space labeled the L5-S1 level. Alignment: 2 mm degenerative retrolisthesis of L1 on L2 and L2 on L3, with 3 mm anterolisthesis of L5 on S1. Vertebrae: Abnormal marrow replacement with edema and heterogeneous enhancement seen within the L1 vertebral body, concerning for osseous metastatic disease. Associated mild pathologic compression fracture measures up to 20% without bony retropulsion. No extra osseous or epidural extension of tumor. No other visible metastatic lesions within the lumbar spine. Bone marrow signal intensity otherwise normal. Prominent degenerative reactive endplate changes noted about the L2-3 interspace. No other abnormal marrow edema or enhancement. Conus medullaris and cauda equina: Conus extends to the T12-L1 no abnormal enhancement. Level. Conus and cauda equina appear normal. Paraspinal and other soft tissues: Changes of acute pyelonephritis involving the left greater than right kidneys noted, described on prior CT. 5.3 cm simple right renal cyst, benign in appearance, no follow-up  imaging recommended. Disc levels: T12-L1: Mild diffuse disc bulge with reactive endplate spurring. Mild facet hypertrophy. No spinal stenosis. Foramina remain patent. L1-2: Degenerative intervertebral disc space narrowing with diffuse disc bulge and disc desiccation. Reactive endplate spurring. Mild bilateral facet hypertrophy. Resultant mild narrowing of the lateral recesses bilaterally. Central canal remains patent. Foramina remain adequately patent bilaterally. L2-3: Advanced degenerative intervertebral disc space narrowing with diffuse disc bulge and disc desiccation. Prominent reactive endplate change with marginal endplate osteophytic spurring and marrow edema. Mild to moderate bilateral facet arthrosis. Resultant moderate canal with bilateral subarticular stenosis. Moderate left worse than right L2 foraminal narrowing. L3-4: Diffuse disc bulge with reactive endplate spurring. Superimposed small left foraminal disc protrusion contacts the exiting left L3 nerve root (series 9, image 12). Mild-to-moderate bilateral facet hypertrophy. Resultant mild narrowing of the lateral recesses bilaterally. Central canal remains patent. Mild to moderate left L3 foraminal stenosis. Right neural foramen remains patent. L4-5: Disc desiccation with diffuse disc bulge. Reactive endplate spurring. Moderate to severe bilateral facet arthrosis with associated small reactive joint effusions. Resultant mild to moderate bilateral subarticular stenosis. Central canal remains patent. Moderate to severe bilateral L4 foraminal narrowing. L5-S1: Anterolisthesis with degenerative intervertebral disc space narrowing. Diffuse disc bulge with reactive endplate spurring. Moderate bilateral facet hypertrophy. No significant spinal stenosis. Moderate bilateral L5 foraminal narrowing. IMPRESSION: 1. Abnormal marrow replacement with heterogeneous enhancement within the L1 vertebral body, concerning for osseous metastatic disease. Associated mild  pathologic compression fracture measures up to 20% without bony retropulsion. No extra osseous or epidural extension of tumor. 2. No other evidence for metastatic disease within the lumbar spine. 3. Multilevel lumbar spondylosis with resultant moderate canal stenosis at L2-3. Associated multilevel foraminal narrowing as above, moderate to severe in nature at L4-5 bilaterally. 4. Changes of acute pyelonephritis involving the left greater than right kidneys, described on prior CT. Electronically Signed   By: Rise Mu M.D.   On: 07/28/2023 23:59   MR BRAIN W WO CONTRAST Result Date: 07/28/2023 CLINICAL DATA:  Initial evaluation for brain/CNS neoplasm EXAM: MRI HEAD WITHOUT AND WITH CONTRAST TECHNIQUE: Multiplanar, multiecho pulse sequences of the brain and surrounding structures were obtained without and with intravenous contrast. CONTRAST:  10mL GADAVIST GADOBUTROL 1 MMOL/ML IV SOLN COMPARISON:  Prior CT from 07/28/2023. FINDINGS: Brain: Cerebral volume within normal limits. Patchy T2/FLAIR hyperintensity involving the periventricular and deep white matter both supra hemispheres, consistent with chronic small vessel ischemic disease, moderately advanced in nature. Punctate acute ischemic nonhemorrhagic infarct present at the right parietal cortex (series 9, image 98). No other evidence for acute  or subacute ischemia. Mixed cystic and solid peripherally enhancing mass positioned at the left occipital lobe is seen, measuring 4.0 x 3.5 x 3.7 cm. Mild surrounding vasogenic edema without significant regional mass effect or midline shift. 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 No other visible mass lesions or abnormal enhancement. No hydrocephalus or extra-axial fluid collection. Pituitary gland suprasellar region within normal limits. Vascular: Major intracranial vascular flow voids are maintained. Skull and upper cervical spine:  Craniocervical junction within normal limits. Visualized bone marrow signal intensity within normal limits. Postoperative changes noted within the partially visualized upper cervical spine. Sinuses/Orbits: Globes and orbital soft tissues within normal limits. Paranasal sinuses are largely clear. No significant mastoid effusion. Other: None. IMPRESSION: 1. 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 Mild surrounding vasogenic edema without significant regional mass effect or midline shift. 2. 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 3. Underlying moderately advanced chronic microvascular ischemic disease. Electronically Signed   By: Rise Mu M.D.   On: 07/28/2023 23:48   CT L-SPINE NO CHARGE Result Date: 07/28/2023 CLINICAL DATA:  Back pain after fall December 1st EXAM: CT LUMBAR SPINE WITHOUT CONTRAST TECHNIQUE: Multidetector CT imaging of the lumbar spine was performed without intravenous contrast administration. Multiplanar CT image reconstructions were also generated. RADIATION DOSE REDUCTION: This exam was performed according to the departmental dose-optimization program which includes automated exposure control, adjustment of the mA and/or kV according to patient size and/or use of iterative reconstruction technique. COMPARISON:  CT 04/05/2016 FINDINGS: Segmentation: 5 lumbar type vertebrae. Alignment: No evidence of traumatic listhesis. Mild grade 1 retrolisthesis of L2 is likely chronic. Vertebrae: Mild superior endplate compression of L1, may be chronic though is age indeterminate without recent comparison. Otherwise no evidence of acute fracture. There is mild heterogeneity of the marrow of L1 and metastasis is not excluded. Consider MRI for further evaluation. Paraspinal and other soft tissues: See separate report for findings in the abdomen and  pelvis. Disc levels: Multilevel spondylosis and disc space height loss with degenerative endplate changes greatest at L2-L3 where it is advanced. Mild multilevel facet arthropathy. Spinal canal narrowing is greatest at L2-L3 where it is moderate. IMPRESSION: 1. Mild superior endplate compression of L1 may be chronic though is age indeterminate without recent comparison. 2. Heterogeneity of the marrow of L1. This may be related to chronic changes however metastasis is not excluded. Consider MRI for further evaluation. \ 3. Advanced disc space height loss at L2-L3. 4. See separate report for findings in the chest, abdomen, and pelvis. Electronically Signed   By: Minerva Fester M.D.   On: 07/28/2023 18:50   CT CHEST ABDOMEN PELVIS W CONTRAST Result Date: 07/28/2023 CLINICAL DATA:  Altered mental status for 1 week and back pain after a fall on December 1st. EXAM: CT CHEST, ABDOMEN, AND PELVIS WITH CONTRAST TECHNIQUE: Multidetector CT imaging of the chest, abdomen and pelvis was performed following the standard protocol during bolus administration of intravenous contrast. RADIATION DOSE REDUCTION: This exam was performed according to the departmental dose-optimization program which includes automated exposure control, adjustment of the mA and/or kV according to patient size and/or use of iterative reconstruction technique. CONTRAST:  75mL OMNIPAQUE IOHEXOL 350 MG/ML SOLN COMPARISON:  CT abdomen and pelvis 01/14/2008 and chest radiographs 12/07/2014 FINDINGS: CT CHEST FINDINGS Cardiovascular: Normal heart size. Small pericardial effusion.  Pericardial thickening where it abuts the large left suprahilar mass. Coronary artery and aortic atherosclerotic calcification. The left upper lobe pulmonary arteries are not well visualized and may be compressed or occluded by the mass. Mediastinum/Nodes: Large heterogenous mass in the left suprahilar region extending into the left upper lobe. Mediastinal, bilateral hilar, and left  axillary lymphadenopathy. For example a subcarinal node measures 2.5 cm (series 3/image 27); 1.3 cm right hilar node on series 3/image 34; and a left axillary node measures 2.2 cm (3/19). Trachea and esophagus are unremarkable. Lungs/Pleura: Large heterogenous mass left upper lobe/suprahilar mass consistent with malignancy measuring 12.1 x 8.7 by 12.1 cm. There are a few small locules of gas in the superior portion of the mass which may represent cavitation. The left upper lobe bronchus is occluded by the mass. Diffuse bronchial wall thickening in the lingula and left lower lobe with centrilobular micro nodules and tree-in-bud opacities. 1.6 cm subpleural nodule in the anterior lingula (series 4/image 90). Small left pleural effusion. Multiple pulmonary nodules in the right lung consistent with metastases. For example 12 mm nodule in the posterior right lower lobe (4/128) and 9 mm nodule in the right upper lobe (4/44). Mild emphysema in the right. No right pleural effusion. No pneumothorax. Musculoskeletal: No acute fracture.  No destructive osseous lesion. CT ABDOMEN PELVIS FINDINGS Hepatobiliary: No focal hepatic lesion. Layering sludge in the gallbladder. No biliary dilation or evidence of cholecystitis. Pancreas: Unremarkable. Spleen: Unremarkable. Adrenals/Urinary Tract: Normal adrenal glands. Heterogenous attenuation of the left kidney. Focal geographic hypoattenuation in the posterior right kidney. Mild asymmetric left-greater-than-right perinephric stranding. Findings are compatible with pyelonephritis. No urinary calculi or hydronephrosis. Unremarkable bladder. Stomach/Bowel: Normal caliber large and small bowel without bowel wall thickening. Stomach and appendix are within normal limits. Colonic diverticulosis without diverticulitis. Vascular/Lymphatic: Aortic atherosclerosis. 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  The stent in the right superficial femoral artery is not definitively opacified and may be occluded. No enlarged abdominal or pelvic lymph nodes. Reproductive: No acute abnormality. Other: No free intraperitoneal fluid or air. Musculoskeletal: No acute fracture or destructive osseous lesion in the pelvis. See separate report for findings in the lumbar spine. IMPRESSION: CHEST: 1. Large heterogenous mass in the left upper lobe/suprahilar region consistent with malignancy. 2. Diffuse bronchial wall thickening in the lingula and left lower lobe. Differential considerations include lymphangitic spread of tumor or airway infection/inflammation. 3. Multiple pulmonary nodules in the right lung consistent with metastases. 4. Mediastinal, bilateral hilar, and left axillary lymphadenopathy consistent with metastases. 5. Small left pleural effusion. ABDOMEN/PELVIS: 1. Bilateral pyelonephritis, left-greater-than-right. 2. The stent in the right superficial femoral artery is not definitively opacified and may be occluded though this is incompletely evaluated on non arterial phase exam. 3. 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 Aortic Atherosclerosis (ICD10-I70.0) and Emphysema (ICD10-J43.9). Electronically Signed   By: Minerva Fester M.D.   On: 07/28/2023 18:16   CT Head Wo Contrast Result Date: 07/28/2023 CLINICAL DATA:  Delirium EXAM: CT HEAD WITHOUT CONTRAST TECHNIQUE: Contiguous axial images were obtained from the base of the skull through the vertex without intravenous contrast. RADIATION DOSE REDUCTION: This exam was performed according to the departmental dose-optimization program which includes automated exposure control, adjustment of the mA and/or kV according to patient size and/or use of iterative reconstruction technique. COMPARISON:  CT of the head  December 07, 2014. FINDINGS: Brain: Complex cystic and solid 3.4 cm mass  in the left parietal lobe. This lesion has some mild hyperdensity associated with it which could represent mineralization and or hemorrhage. Surrounding edema and mild regional mass effect without midline shift. No evidence of acute large vascular territory infarct, mass occupying acute hemorrhage outside the above lesion, midline shift or hydrocephalus. Vascular: No hyperdense vessel identified. Skull: Normal. Negative for fracture or focal lesion. Sinuses/Orbits: Mostly clear sinuses.  No acute orbital findings. IMPRESSION: 1. Cystic and solid 3.4 cm mass in the left parietal lobe, which is concerning for a metastasis given the findings on the forthcoming CT of the chest/abdomen/pelvis. Recommend MRI of the head with contrast to further characterize and to assess for other lesions. 2. Surrounding edema and mild regional mass effect without midline shift. Electronically Signed   By: Feliberto Harts M.D.   On: 07/28/2023 18:02      Signature  -   Susa Raring M.D on 07/30/2023 at 10:47 AM   -  To page go to [email or web address]

## 2023-07-30 NOTE — Progress Notes (Signed)
Patient off unit at this time for procedure.

## 2023-07-30 NOTE — Anesthesia Procedure Notes (Signed)
Procedure Name: Intubation Date/Time: 07/30/2023 10:16 AM  Performed by: Flonnie Hailstone, CRNAPre-anesthesia Checklist: Patient identified, Emergency Drugs available, Suction available and Patient being monitored Patient Re-evaluated:Patient Re-evaluated prior to induction Oxygen Delivery Method: Circle system utilized Preoxygenation: Pre-oxygenation with 100% oxygen Induction Type: IV induction, Rapid sequence and Cricoid Pressure applied Laryngoscope Size: Mac and 4 Grade View: Grade I Tube type: Oral Tube size: 8.0 mm Number of attempts: 1 Airway Equipment and Method: Stylet Placement Confirmation: ETT inserted through vocal cords under direct vision, positive ETCO2 and breath sounds checked- equal and bilateral Tube secured with: Tape Dental Injury: Teeth and Oropharynx as per pre-operative assessment  Comments: intubated by C. Amma Crear, CRNA; ebbs

## 2023-07-30 NOTE — Anesthesia Preprocedure Evaluation (Signed)
Anesthesia Evaluation  Patient identified by MRN, date of birth, ID band Patient awake    Reviewed: Allergy & Precautions, NPO status , Patient's Chart, lab work & pertinent test results  Airway Mallampati: II       Dental no notable dental hx.    Pulmonary Current Smoker and Patient abstained from smoking.   Pulmonary exam normal        Cardiovascular hypertension, Pt. on medications + Peripheral Vascular Disease  Normal cardiovascular exam Rhythm:Regular Rate:Normal     Neuro/Psych    Depression       GI/Hepatic   Endo/Other  diabetes    Renal/GU      Musculoskeletal  (+) Arthritis , Osteoarthritis,    Abdominal Normal abdominal exam  (+)   Peds  Hematology   Anesthesia Other Findings   Reproductive/Obstetrics                             Anesthesia Physical Anesthesia Plan  ASA: 3  Anesthesia Plan: General   Post-op Pain Management:    Induction: Intravenous  PONV Risk Score and Plan: 1 and Ondansetron and Propofol infusion  Airway Management Planned: Oral ETT  Additional Equipment: None  Intra-op Plan:   Post-operative Plan: Extubation in OR  Informed Consent: I have reviewed the patients History and Physical, chart, labs and discussed the procedure including the risks, benefits and alternatives for the proposed anesthesia with the patient or authorized representative who has indicated his/her understanding and acceptance.     Dental advisory given  Plan Discussed with: CRNA  Anesthesia Plan Comments:        Anesthesia Quick Evaluation

## 2023-07-30 NOTE — Progress Notes (Signed)
Patient non compliant with cares. Redirected multiple times but still refused to leave nasal cannula on. Refused BS check per PCT. MD aware, restraint order, but yet to start. Tele monitor on patient, prn haldol given. Patient could not go for MRI due to his confused agitated state which begin just 5 minutes prior to being picked up. Patient given prn diltiazem for heart rate that was 140-160s. Resting now in bed at time of this note. Vital check and charted at this time.

## 2023-07-30 NOTE — Progress Notes (Signed)
PHARMACY - ANTICOAGULATION CONSULT NOTE  Pharmacy Consult for heparin Indication:  new onset atrial fibrillation and new subsegmental pulmonary embolism  Allergies  Allergen Reactions   Ceftriaxone Itching, Nausea And Vomiting and Rash    Noted minutes after starting medication No rash noted with Ancef on 01-31-2022 during general anesthesia Has received cefepime and cefazolin without reaction    Patient Measurements: Height: 6\' 3"  (190.5 cm) Weight: 77.7 kg (171 lb 4.8 oz) IBW/kg (Calculated) : 84.5 Heparin Dosing Weight: 77 kg  Vital Signs: Temp: 98.5 F (36.9 C) (12/17 0000) Temp Source: Oral (12/17 0000) BP: 115/60 (12/17 0000) Pulse Rate: 90 (12/17 0000)  Labs: Recent Labs    07/28/23 1548 07/28/23 1756 07/29/23 0422 07/29/23 1737 07/30/23 0114  HGB 13.0  --  11.3*  --   --   HCT 40.9  --  35.7*  --   --   PLT 515*  --  391  --   --   HEPARINUNFRC  --   --   --  <0.10* <0.10*  CREATININE 1.01  --  1.19  --   --   TROPONINIHS 21* 24*  --   --   --     Estimated Creatinine Clearance: 68.9 mL/min (by C-G formula based on SCr of 1.19 mg/dL).   Medical History: Past Medical History:  Diagnosis Date   Arthritis    COVID    has had Covid 3 times, not sick with it   COVID    has had 3 times mild to no symptoms   Depression    Diabetes mellitus without complication (HCC)    pt states he has now been told that he is a type 1 diabetic.   Dyspnea    albuterol inhaler   HLD (hyperlipidemia)    Hypertension    Peripheral vascular disease (HCC)     Medications:  Medications Prior to Admission  Medication Sig Dispense Refill Last Dose/Taking   acetaminophen (TYLENOL) 500 MG tablet Take 2 tablets (1,000 mg total) by mouth every 6 (six) hours as needed for mild pain or fever (For the first 3 days, may take 2 tabs (1000 mg total) 4 times daily scheduled following which can take 2 tabs (1000 mg total) 4 times daily as needed.).   Past Week   atorvastatin (LIPITOR)  10 MG tablet Take 10 mg by mouth daily.   Past Week   gabapentin (NEURONTIN) 600 MG tablet Take 1 tablet (600 mg total) by mouth 3 (three) times daily. 90 tablet 3 Past Week   pioglitazone (ACTOS) 15 MG tablet Take 15 mg by mouth daily.   Taking   insulin aspart protamine - aspart (NOVOLOG 70/30 MIX) (70-30) 100 UNIT/ML FlexPen Inject 10 Units into the skin 2 (two) times daily with a meal. (Patient not taking: Reported on 07/29/2023) 15 mL 0 Not Taking   Insulin Pen Needle 32G X 4 MM MISC Use as directed two times daily. 100 each 0    methocarbamol (ROBAXIN) 500 MG tablet Take 1 tablet (500 mg total) by mouth every 6 (six) hours as needed for muscle spasms. (Patient not taking: Reported on 07/29/2023) 30 tablet 0 Not Taking   Multiple Vitamin (MULTIVITAMIN WITH MINERALS) TABS tablet Take 1 tablet by mouth daily. (Patient not taking: Reported on 07/29/2023)   Not Taking   nicotine (NICODERM CQ - DOSED IN MG/24 HOURS) 14 mg/24hr patch Place 1 patch (14 mg total) onto the skin daily. (Patient not taking: Reported on 07/29/2023) 28 patch  0 Not Taking   oxycodone (OXY-IR) 5 MG capsule Take 1 capsule (5 mg total) by mouth every 6 (six) hours as needed. (Patient not taking: Reported on 07/29/2023) 28 capsule 0 Not Taking   Oxycodone HCl 10 MG TABS Take 0.5 tablets (5 mg total) by mouth every 4 (four) hours as needed. (Patient not taking: Reported on 07/29/2023) 42 tablet 0 Not Taking   Scheduled:   aspirin EC  81 mg Oral Daily   atorvastatin  10 mg Oral QPM   dexamethasone (DECADRON) injection  4 mg Intravenous QHS   digoxin  0.25 mg Oral Daily   diltiazem  90 mg Oral Q8H   insulin aspart  0-20 Units Subcutaneous TID PC & HS   metoprolol tartrate  5 mg Intravenous Q6H   Infusions:   sodium chloride 100 mL/hr at 07/29/23 2137   ceFEPime (MAXIPIME) IV 2 g (07/29/23 1930)   diltiazem (CARDIZEM) infusion 15 mg/hr (07/29/23 2009)   heparin 1,350 Units/hr (07/29/23 2008)    Assessment: 62 YOM admitted  for sepsis and found to have new onset atrial fibrillation and new subsegmental pulmonary embolism. 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 After discussion with hospitalist MD and neurology MD, will proceed with heparin initation at this time for treatment of PE. Pharmacy has been consulted to assist with dosing.   3rd shift follow-up: Repeat heparin level is < 0.10, undetectable on 1200 units/hr.  No known infusion issues or bleeding. Per prior communication from Dr. Chestine Spore, heparin drip to be held at 0500 on 12/17 for bronchoscopy at 0815. Per Dr. Truett Perna, will also need biopsy of lung mass. IR consult pending.  Goal of Therapy:  Heparin level 0.3-0.5 units/ml - narrow goal due to patient at very high bleeding risk Monitor platelets by anticoagulation protocol: Yes   Plan:  Increase heparin drip to 1500 units/hr Monitor for signs/symptoms of bleeding. Heparin drip to be held at 0500 on 12/17 for bronchoscopy at 0815.   F/U anticoagulation plans post bronch   Jani Gravel, PharmD Clinical Pharmacist  07/30/2023 2:53 AM

## 2023-07-30 NOTE — Progress Notes (Addendum)
Paroxysmal atrial fibrillation RVR: Received a call from patient's nurse that patient is very agitated and heart rate is up to 160 to 170 even though patient got oral Cardizem 30 mg few minutes ago.  Patient got Haldol 2 mg and waist restraint has been implemented. Per chart review patient has new onset of atrial fibrillation in the setting of sepsis.  Initially has been treated with Cardizem drip.  Currently on scheduled Cardizem 30 mg every 8 hour, metoprolol 50 mg every 4 hour, digoxin 0.25 mg daily and as needed Cardizem 10 mg every 6 hour for heart rate above 115. Giving Cardizem 10 mg now to see improvement of heart rate. Patient has history of CHF EF 30 to 35%.     Addendum: - Patient's nurse reported that patient is very sleepy unable to keep the p.o. metoprolol tonight.  Patient has been scheduled on p.o. metoprolol 50 mg every 6 hours.  As I am not able to give the metoprolol 50 mg by mouth now changing to p.o. metoprolol 10 mg every 6 hours scheduled.

## 2023-07-30 NOTE — Progress Notes (Signed)
OT Cancellation Note  Patient Details Name: Cody Pena MRN: [id number] DOB: 17-Aug-1960   Cancelled Treatment:    Reason Eval/Treat Not Completed: (P) Patient at procedure or test/ unavailable, will return later as able.  Alexis Goodell 07/30/2023, 10:12 AM

## 2023-07-30 NOTE — Progress Notes (Addendum)
Cody Pena   DOB:1960-08-21   ZO#:[id number]      ASSESSMENT & PLAN:  Lung mass.  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 -CT of head and MRI of brain both concerning for mets. -Biopsy of LUL mass done today, path pending -Dr. [facility] is following closely  2.  Acute PE -noted on ct angio -pt on IV heparin, currently on hold due to earlier bx. Discussed with RN, will be restarted tonight. -monitor closely for bleeding -pt is not candidate for IVC filter  3. Hypercalcemia of malignancy -Status post IV Zometa 4 mg x 1 administered on 07/29/2023.  May repeat in 7 days if calcium levels are still elevated -Slight drop in calcium level from 12.3-11.9 today -continue IV hydration -Will continue to monitor closely  4.  Leukocytosis/sepsis -WBC remains elevated 18.2 -Likely due to infectious process -Continue antibiotics as ordered -Afebrile, monitor fever curve  5.  Encephalopathy/altered mental status -EEG suggestive of moderate diffuse encephalopathy -Neuro is following   Code Status Full   Subjective:  Patient seen awake and alert laying in bed.  Reports he feels a little better however has very little recall of yesterday's events.  Reports left anterior chest wall and axillary pain is slightly better.  No new acute complaints offered.  Objective:  Vitals:   07/30/23 1134 07/30/23 1231  BP:  115/60  Pulse: 90 66  Resp: (!) 22 13  Temp:  (!) 97.3 F (36.3 C)  SpO2: 95%      Intake/Output Summary (Last 24 hours) at 07/30/2023 1600 Last data filed at 07/30/2023 1052 Gross per 24 hour  Intake 2125.57 ml  Output --  Net 2125.57 ml     REVIEW OF SYSTEMS:   Constitutional: +fatigue Denies fevers, chills or abnormal night sweats Eyes: Denies blurriness of vision,  double vision or watery eyes Ears, nose, mouth, throat, and face: Denies mucositis or sore throat Respiratory: Denies cough, dyspnea or wheezes Cardiovascular: Denies palpitation, chest discomfort or lower extremity swelling +R BKA Gastrointestinal:  Denies nausea, heartburn or change in bowel habits Skin: Denies abnormal skin rashes Lymphatics: Denies new lymphadenopathy or easy bruising Neurological: Denies numbness, tingling or new weaknesses Behavioral/Psych: Mood is stable, no new changes  All other systems were reviewed with the patient and are negative.  PHYSICAL EXAMINATION: ECOG PERFORMANCE STATUS: 3 - Symptomatic, >50% confined to bed  Vitals:   07/30/23 1134 07/30/23 1231  BP:  115/60  Pulse: 90 66  Resp: (!) 22 13  Temp:  (!) 97.3 F (36.3 C)  SpO2: 95%    Filed Weights   07/28/23 1518 07/29/23 0200 07/30/23 0925  Weight: 214 lb (97.1 kg) 171 lb 4.8 oz (77.7 kg) 171 lb 4.8 oz (77.7 kg)    GENERAL: alert, no distress and comfortable +cachectic SKIN: skin color, texture, turgor are normal, no rashes or significant lesions EYES: normal, conjunctiva are pink and non-injected, sclera clear OROPHARYNX: no exudate, no erythema and lips, buccal mucosa, and tongue normal  NECK: +tender +cervical LN  LYMPH: no palpable lymphadenopathy in the cervical, axillary or inguinal LUNGS: clear to auscultation and percussion with normal breathing effort HEART: regular rate & rhythm and no murmurs and no lower extremity edema ABDOMEN: abdomen soft, non-tender and normal bowel sounds MUSCULOSKELETAL: +R BKA PSYCH: alert & oriented x 3 with fluent speech NEURO: no focal motor/sensory  deficits   All questions were answered. The patient knows to call the clinic with any problems, questions or concerns.   The total time spent in the appointment was 40 minutes encounter with patient including review of chart and various tests results, discussions about plan of care and coordination of  care plan  Cody Bills, NP 07/30/2023 4:00 PM    Labs Reviewed:  Lab Results  Component Value Date   WBC 18.2 (H) 07/30/2023   HGB 11.1 (L) 07/30/2023   HCT 35.5 (L) 07/30/2023   MCV 84.5 07/30/2023   PLT 448 (H) 07/30/2023   Recent Labs    07/28/23 1548 07/29/23 0422 07/30/23 0422  NA 132* 130* 131*  K 4.5 4.5 4.3  CL 95* 97* 101  CO2 24 24 22   GLUCOSE 228* 189* 236*  BUN 32* 30* 32*  CREATININE 1.01 1.19 0.90  CALCIUM 12.3* 12.3* 11.9*  GFRNONAA >60 >60 >60  PROT 7.3 6.5 6.0*  ALBUMIN 2.2* 2.0* 2.0*  AST 21 15 19   ALT 13 11 13   ALKPHOS 95 85 82  BILITOT 0.9 0.6 0.6    Studies Reviewed:  VAS [place] LOWER EXTREMITY VENOUS (DVT) Result Date: 07/29/2023  Lower Venous DVT Study Patient Name:  Cody Pena  Date of Exam:   07/29/2023 Medical Rec #: [id number]        Accession #:    [id number] Date of Birth: [birth year]       Patient Gender: M Patient Age:   62 years Exam Location:  [facility] Procedure:      VAS [place] LOWER EXTREMITY VENOUS (DVT) Referring Phys: CHING TU --------------------------------------------------------------------------------  Indications: Pulmonary embolism.  Risk Factors: Confirmed PE Surgery RT BKA. Anticoagulation: Heparin. Comparison Study: No significant changes seen since prior exam 07/24/21 Performing Technologist: Shona Simpson  Examination Guidelines: A complete evaluation includes B-mode imaging, spectral Doppler, color Doppler, and power Doppler as needed of all accessible portions of each vessel. Bilateral testing is considered an integral part of a complete examination. Limited examinations for reoccurring indications may be performed as noted. The reflux portion of the exam is performed with the patient in reverse Trendelenburg.  +---------+---------------+---------+-----------+----------+--------------+ RIGHT    CompressibilityPhasicitySpontaneityPropertiesThrombus Aging  +---------+---------------+---------+-----------+----------+--------------+ CFV      Full           Yes      Yes                                 +---------+---------------+---------+-----------+----------+--------------+ SFJ      Full                                                        +---------+---------------+---------+-----------+----------+--------------+ FV Prox  Full                                                        +---------+---------------+---------+-----------+----------+--------------+ FV Mid   Full                                                        +---------+---------------+---------+-----------+----------+--------------+  FV DistalFull                                                        +---------+---------------+---------+-----------+----------+--------------+ PFV      Full                                                        +---------+---------------+---------+-----------+----------+--------------+ POP      Full           Yes      Yes                                 +---------+---------------+---------+-----------+----------+--------------+ PTV      Full                                                        +---------+---------------+---------+-----------+----------+--------------+ PERO     Full                                                        +---------+---------------+---------+-----------+----------+--------------+   +---------+---------------+---------+-----------+----------+--------------+ LEFT     CompressibilityPhasicitySpontaneityPropertiesThrombus Aging +---------+---------------+---------+-----------+----------+--------------+ CFV      Full           Yes      Yes                                 +---------+---------------+---------+-----------+----------+--------------+ SFJ      Full                                                         +---------+---------------+---------+-----------+----------+--------------+ FV Prox  Full                                                        +---------+---------------+---------+-----------+----------+--------------+ FV Mid   Full                                                        +---------+---------------+---------+-----------+----------+--------------+ FV DistalFull                                                        +---------+---------------+---------+-----------+----------+--------------+  PFV      Full                                                        +---------+---------------+---------+-----------+----------+--------------+ POP      Full           Yes      Yes                                 +---------+---------------+---------+-----------+----------+--------------+ PTV      Full                                                        +---------+---------------+---------+-----------+----------+--------------+ PERO     Full                                                        +---------+---------------+---------+-----------+----------+--------------+     Summary: BILATERAL: - No evidence of deep vein thrombosis seen in the lower extremities, bilaterally. -No evidence of popliteal cyst, bilaterally.   *See table(s) above for measurements and observations. Electronically signed by Gerarda Fraction on 07/29/2023 at 6:50:19 PM.    Final    ECHOCARDIOGRAM COMPLETE Result Date: 07/29/2023    ECHOCARDIOGRAM REPORT   Patient Name:   DALANO LATKA Date of Exam: 07/29/2023 Medical Rec #:  [id number]       Height:       75.0 in Accession #:    [id number]      Weight:       171.3 lb Date of Birth:  05-04-1961      BSA:          2.055 m Patient Age:    62 years        BP:           140/117 mmHg Patient Gender: M               HR:           122 bpm. Exam Location:  Inpatient Procedure: 2D Echo, Cardiac Doppler and Color Doppler Indications:    Atrial  Fibrillation I48.91  History:        Patient has no prior history of Echocardiogram examinations.                 Arrythmias:Atrial Fibrillation, Signs/Symptoms:Dyspnea; Risk                 Factors:Hypertension, Diabetes, Current Smoker and Dyslipidemia.  Sonographer:    Lucendia Herrlich RCS Referring Phys: [id number] CHING T TU  Sonographer Comments: Image acquisition challenging due to uncooperative patient. IMPRESSIONS  1. Left ventricular ejection fraction, by estimation, is 30 to 35%. The left ventricle has moderately decreased function. The left ventricle demonstrates global hypokinesis. The left ventricular internal cavity size was mildly dilated. Left ventricular diastolic parameters are indeterminate.  2. Right ventricular systolic function is normal. The right ventricular size is normal. There is normal pulmonary  artery systolic pressure. The estimated right ventricular systolic pressure is 25.8 mmHg.  3. The mitral valve is normal in structure. Trivial mitral valve regurgitation. No evidence of mitral stenosis.  4. The aortic valve is tricuspid. There is mild calcification of the aortic valve. Aortic valve regurgitation is trivial. No aortic stenosis is present.  5. The inferior vena cava is dilated in size with <50% respiratory variability, suggesting right atrial pressure of 15 mmHg.  6. The patient was in atrial fibrillation. FINDINGS  Left Ventricle: Left ventricular ejection fraction, by estimation, is 30 to 35%. The left ventricle has moderately decreased function. The left ventricle demonstrates global hypokinesis. The left ventricular internal cavity size was mildly dilated. There is no left ventricular hypertrophy. Left ventricular diastolic parameters are indeterminate. Right Ventricle: The right ventricular size is normal. No increase in right ventricular wall thickness. Right ventricular systolic function is normal. There is normal pulmonary artery systolic pressure. The tricuspid regurgitant  velocity is 1.64 m/s, and  with an assumed right atrial pressure of 15 mmHg, the estimated right ventricular systolic pressure is 25.8 mmHg. Left Atrium: Left atrial size was normal in size. Right Atrium: Right atrial size was normal in size. Pericardium: There is no evidence of pericardial effusion. Mitral Valve: The mitral valve is normal in structure. There is mild thickening of the mitral valve leaflet(s). Trivial mitral valve regurgitation. No evidence of mitral valve stenosis. Tricuspid Valve: The tricuspid valve is normal in structure. Tricuspid valve regurgitation is trivial. Aortic Valve: The aortic valve is tricuspid. There is mild calcification of the aortic valve. Aortic valve regurgitation is trivial. No aortic stenosis is present. Aortic valve peak gradient measures 9.7 mmHg. Pulmonic Valve: The pulmonic valve was normal in structure. Pulmonic valve regurgitation is not visualized. Aorta: The aortic root is normal in size and structure. Venous: The inferior vena cava is dilated in size with less than 50% respiratory variability, suggesting right atrial pressure of 15 mmHg. IAS/Shunts: No atrial level shunt detected by color flow Doppler.  LEFT VENTRICLE PLAX 2D LVIDd:         5.50 cm   Diastology LVIDs:         4.60 cm   LV e' medial:    9.64 cm/s LV PW:         1.00 cm   LV E/e' medial:  10.8 LV IVS:        0.90 cm   LV e' lateral:   14.10 cm/s LVOT diam:     2.10 cm   LV E/e' lateral: 7.4 LV SV:         62 LV SV Index:   30 LVOT Area:     3.46 cm  RIGHT VENTRICLE             IVC RV S prime:     15.50 cm/s  IVC diam: 2.40 cm TAPSE (M-mode): 2.1 cm LEFT ATRIUM         Index LA diam:    3.90 cm 1.90 cm/m  AORTIC VALVE AV Area (Vmax): 2.73 cm AV Vmax:        155.50 cm/s AV Peak Grad:   9.7 mmHg LVOT Vmax:      122.67 cm/s LVOT Vmean:     80.267 cm/s LVOT VTI:       0.180 m  AORTA Ao Root diam: 3.70 cm Ao Asc diam:  3.60 cm MITRAL VALVE  TRICUSPID VALVE MV Area (PHT): 5.84 cm     TR Peak  grad:   10.8 mmHg MV Decel Time: 130 msec     TR Vmax:        164.00 cm/s MV E velocity: 104.00 cm/s MV A velocity: 70.50 cm/s   SHUNTS MV E/A ratio:  1.48         Systemic VTI:  0.18 m                             Systemic Diam: 2.10 cm Dalton McleanMD Electronically signed by Wilfred Lacy Signature Date/Time: 07/29/2023/5:47:50 PM    Final    EEG adult Result Date: 07/29/2023 Charlsie Quest, MD     07/29/2023 11:52 AM Patient Name: CIRO AVALLONE MRN: [id number] Epilepsy Attending: Charlsie Quest Referring Physician/Provider: Caryl Pina, MD Date: 07/29/2023 Duration: 25.56 mins Patient history: 62 yo M presented to the ED yesterday afternoon with a one week history of AMS and back pain. EEG to evaluate for seizure Level of alertness: Awake AEDs during EEG study: None Technical aspects: This EEG study was done with scalp electrodes positioned according to the 10-20 International system of electrode placement. Electrical activity was reviewed with band pass filter of 1-70Hz , sensitivity of 7 uV/mm, display speed of 37mm/sec with a 60Hz  notched filter applied as appropriate. EEG data were recorded continuously and digitally stored.  Video monitoring was available and reviewed as appropriate. Description: No posterior dominant rhythm was seen. EEG showed continuous generalized 3 to 6 Hz theta-delta slowing admixed with 12-14 Hz beta activity distributed symmetrically and diffusely.  Physiologic photic driving was not seen during photic stimulation.  Hyperventilation was not performed.   ABNORMALITY - Continuous slow, generalized IMPRESSION: This study is suggestive of moderate diffuse encephalopathy. No seizures or epileptiform discharges were seen throughout the recording. Charlsie Quest    CT Angio Chest Pulmonary Embolism (PE) W or WO Contrast Result Date: 07/29/2023 CLINICAL DATA:  Pulmonary embolism suspected. Positive D-dimer. CT yesterday demonstrating a large left upper lobe neoplasm with  mediastinal and hilar adenopathy and bronchovascular encasement. EXAM: CT ANGIOGRAPHY CHEST WITH CONTRAST TECHNIQUE: Multidetector CT imaging of the chest was performed using the standard protocol during bolus administration of intravenous contrast. Multiplanar CT image reconstructions and MIPs were obtained to evaluate the vascular anatomy. RADIATION DOSE REDUCTION: This exam was performed according to the departmental dose-optimization program which includes automated exposure control, adjustment of the mA and/or kV according to patient size and/or use of iterative reconstruction technique. CONTRAST:  75mL OMNIPAQUE IOHEXOL 350 MG/ML SOLN COMPARISON:  CT chest, abdomen and pelvis with IV contrast yesterday at 5:28 p.m. (current exam 07/29/2023 at 12:08 a.m.). FINDINGS: Cardiovascular: There is a small embolic burden in the right lower lobe with nonocclusive thrombus within a posterior basal subsegmental branch likely extending into 2 of its downstream small branches. There is no evidence of right heart strain and no further embolus is seen. Bronchovascular tumor encasement narrows the distal left main pulmonary artery and it appears to almost completely occlude the left upper lobe pulmonary artery, and occludes the left superior pulmonary vein. Pulmonary trunk is upper limits of normal in caliber. Cardiac size is normal. There is a small pericardial effusion. There is no venous dilatation. Aortic and three-vessel coronary artery atherosclerosis is present but no aneurysm, stenosis or dissection, with scattered plaque in the great vessels. Mediastinum/Nodes: Large left upper lobe mass encases and obscures the hilar  structures for the most part. There is undoubtedly left hilar adenopathy but it is difficult to separate from the mass. Multifocal mediastinal adenopathy is seen with subcarinal nodal mass 4.3 x 2.6 cm on 5:74, right paratracheal adenopathy with multiple lymph nodes enlarged for example measuring 2 x 1.9  cm on 5:48, enlarged right hilar nodes measuring as much as 2.4 x 3.1 cm on 5:71, and left axillary adenopathy with the largest of these lymph nodes measuring 2.3 x 2.0 cm on 5:53. The trachea and main bronchi are patent. Unremarkable thoracic esophagus. Lungs/Pleura: Large heterogeneous left upper lobe mass obstructs the left upper lobe bronchus, with no visible remaining aeration in the left upper lobe. Scattered foci of air in the apical portion of the mass could indicate tumoral necrosis or residual air bronchograms versus infectious complication. In total the mass measures 12.7 cm AP on 5:53, 9 cm transverse on 5:44, and 14.6 cm in height. Small layering left and minimal layering right pleural effusions are also again noted as well as multiple right lung nodules consistent with metastases. There is bilateral bronchial thickening but it is proportionately greater in the left lower lobe where there is peribronchovascular interstitial and airspace opacities which could indicate changes of bronchopneumonia or lymphangitic carcinomatosis. Multiple micronodules are noted in this distribution. Examples of some of the larger right lung metastases include a 1.1 cm nodule in the right upper lobe on 6:46, a right lower lobe superior segment nodule measuring 1.4 cm on 6:51, and a 1.3 cm right lower lobe nodule on 6:119. Others are smaller. There are mild centrilobular and paraseptal emphysematous changes in the right upper lobe. Upper Abdomen: No upper abdominal metastasis is seen. Musculoskeletal: Thoracic spondylosis. Partially visible lower cervical ACDF plating. Multilevel thoracic degenerative disc disease. No destructive bone lesions. Review of the MIP images confirms the above findings. IMPRESSION: 1. Small subsegmental embolic burden in the right lower lobe, with no evidence of right heart strain. 2. Large 12.7 x 9 x 14.6 cm left upper lobe mass with bronchovascular encasement, occluding the left upper lobe  bronchus and almost completely occluding the left upper lobe pulmonary artery, and occluding the left superior pulmonary vein. 3. Multiple right lung nodules consistent with metastases. 4. Bilateral bronchial thickening but proportionately greater in the left lower lobe where there is peribronchovascular interstitial and airspace opacities which could indicate changes of bronchopneumonia or lymphangitic carcinomatosis. 5. Small left and minimal right pleural effusions. 6. Multifocal mediastinal, right hilar and left axillary adenopathy. 7. Aortic and coronary artery atherosclerosis. 8. Emphysema. 9. These results will be called to the ordering clinician or representative by the Radiologist Assistant, and communication documented in the PACS or [employer]. Aortic Atherosclerosis (ICD10-I70.0) and Emphysema (ICD10-J43.9). Electronically Signed   By: Almira Bar M.D.   On: 07/29/2023 00:42   MR Lumbar Spine W Wo Contrast Result Date: 07/28/2023 CLINICAL DATA:  Initial evaluation for acute low back pain EXAM: MRI LUMBAR SPINE WITHOUT AND WITH CONTRAST TECHNIQUE: Multiplanar and multiecho pulse sequences of the lumbar spine were obtained without and with intravenous contrast. CONTRAST:  10mL GADAVIST GADOBUTROL 1 MMOL/ML IV SOLN COMPARISON:  Prior CT from earlier the same day. FINDINGS: Segmentation: Standard. Lowest well-formed disc space labeled the L5-S1 level. Alignment: 2 mm degenerative retrolisthesis of L1 on L2 and L2 on L3, with 3 mm anterolisthesis of L5 on S1. Vertebrae: Abnormal marrow replacement with edema and heterogeneous enhancement seen within the L1 vertebral body, concerning for osseous metastatic disease. Associated mild pathologic compression  fracture measures up to 20% without bony retropulsion. No extra osseous or epidural extension of tumor. No other visible metastatic lesions within the lumbar spine. Bone marrow signal intensity otherwise normal. Prominent degenerative reactive  endplate changes noted about the L2-3 interspace. No other abnormal marrow edema or enhancement. Conus medullaris and cauda equina: Conus extends to the T12-L1 no abnormal enhancement. Level. Conus and cauda equina appear normal. Paraspinal and other soft tissues: Changes of acute pyelonephritis involving the left greater than right kidneys noted, described on prior CT. 5.3 cm simple right renal cyst, benign in appearance, no follow-up imaging recommended. Disc levels: T12-L1: Mild diffuse disc bulge with reactive endplate spurring. Mild facet hypertrophy. No spinal stenosis. Foramina remain patent. L1-2: Degenerative intervertebral disc space narrowing with diffuse disc bulge and disc desiccation. Reactive endplate spurring. Mild bilateral facet hypertrophy. Resultant mild narrowing of the lateral recesses bilaterally. Central canal remains patent. Foramina remain adequately patent bilaterally. L2-3: Advanced degenerative intervertebral disc space narrowing with diffuse disc bulge and disc desiccation. Prominent reactive endplate change with marginal endplate osteophytic spurring and marrow edema. Mild to moderate bilateral facet arthrosis. Resultant moderate canal with bilateral subarticular stenosis. Moderate left worse than right L2 foraminal narrowing. L3-4: Diffuse disc bulge with reactive endplate spurring. Superimposed small left foraminal disc protrusion contacts the exiting left L3 nerve root (series 9, image 12). Mild-to-moderate bilateral facet hypertrophy. Resultant mild narrowing of the lateral recesses bilaterally. Central canal remains patent. Mild to moderate left L3 foraminal stenosis. Right neural foramen remains patent. L4-5: Disc desiccation with diffuse disc bulge. Reactive endplate spurring. Moderate to severe bilateral facet arthrosis with associated small reactive joint effusions. Resultant mild to moderate bilateral subarticular stenosis. Central canal remains patent. Moderate to severe  bilateral L4 foraminal narrowing. L5-S1: Anterolisthesis with degenerative intervertebral disc space narrowing. Diffuse disc bulge with reactive endplate spurring. Moderate bilateral facet hypertrophy. No significant spinal stenosis. Moderate bilateral L5 foraminal narrowing. IMPRESSION: 1. Abnormal marrow replacement with heterogeneous enhancement within the L1 vertebral body, concerning for osseous metastatic disease. Associated mild pathologic compression fracture measures up to 20% without bony retropulsion. No extra osseous or epidural extension of tumor. 2. No other evidence for metastatic disease within the lumbar spine. 3. Multilevel lumbar spondylosis with resultant moderate canal stenosis at L2-3. Associated multilevel foraminal narrowing as above, moderate to severe in nature at L4-5 bilaterally. 4. Changes of acute pyelonephritis involving the left greater than right kidneys, described on prior CT. Electronically Signed   By: Rise Mu M.D.   On: 07/28/2023 23:59   MR BRAIN W WO CONTRAST Result Date: 07/28/2023 CLINICAL DATA:  Initial evaluation for brain/CNS neoplasm EXAM: MRI HEAD WITHOUT AND WITH CONTRAST TECHNIQUE: Multiplanar, multiecho pulse sequences of the brain and surrounding structures were obtained without and with intravenous contrast. CONTRAST:  10mL GADAVIST GADOBUTROL 1 MMOL/ML IV SOLN COMPARISON:  Prior CT from 07/28/2023. FINDINGS: Brain: Cerebral volume within normal limits. Patchy T2/FLAIR hyperintensity involving the periventricular and deep white matter both supra hemispheres, consistent with chronic small vessel ischemic disease, moderately advanced in nature. Punctate acute ischemic nonhemorrhagic infarct present at the right parietal cortex (series 9, image 98). No other evidence for acute or subacute ischemia. Mixed cystic and solid peripherally enhancing mass positioned at the left occipital lobe is seen, measuring 4.0 x 3.5 x 3.7 cm. Mild surrounding vasogenic  edema without significant regional mass effect or midline shift. 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 No other visible mass  lesions or abnormal enhancement. No hydrocephalus or extra-axial fluid collection. Pituitary gland suprasellar region within normal limits. Vascular: Major intracranial vascular flow voids are maintained. Skull and upper cervical spine: Craniocervical junction within normal limits. Visualized bone marrow signal intensity within normal limits. Postoperative changes noted within the partially visualized upper cervical spine. Sinuses/Orbits: Globes and orbital soft tissues within normal limits. Paranasal sinuses are largely clear. No significant mastoid effusion. Other: None. IMPRESSION: 1. 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 Mild surrounding vasogenic edema without significant regional mass effect or midline shift. 2. 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 3. Underlying moderately advanced chronic microvascular ischemic disease. Electronically Signed   By: Rise Mu M.D.   On: 07/28/2023 23:48   CT L-SPINE NO CHARGE Result Date: 07/28/2023 CLINICAL DATA:  Back pain after fall December 1st EXAM: CT LUMBAR SPINE WITHOUT CONTRAST TECHNIQUE: Multidetector CT imaging of the lumbar spine was performed without intravenous contrast administration. Multiplanar CT image reconstructions were also generated. RADIATION DOSE REDUCTION: This exam was performed according to the departmental dose-optimization program which includes automated exposure control, adjustment of the mA and/or kV according to patient size and/or use of iterative reconstruction technique. COMPARISON:  CT 04/05/2016 FINDINGS: Segmentation: 5 lumbar type vertebrae. Alignment: No evidence of traumatic  listhesis. Mild grade 1 retrolisthesis of L2 is likely chronic. Vertebrae: Mild superior endplate compression of L1, may be chronic though is age indeterminate without recent comparison. Otherwise no evidence of acute fracture. There is mild heterogeneity of the marrow of L1 and metastasis is not excluded. Consider MRI for further evaluation. Paraspinal and other soft tissues: See separate report for findings in the abdomen and pelvis. Disc levels: Multilevel spondylosis and disc space height loss with degenerative endplate changes greatest at L2-L3 where it is advanced. Mild multilevel facet arthropathy. Spinal canal narrowing is greatest at L2-L3 where it is moderate. IMPRESSION: 1. Mild superior endplate compression of L1 may be chronic though is age indeterminate without recent comparison. 2. Heterogeneity of the marrow of L1. This may be related to chronic changes however metastasis is not excluded. Consider MRI for further evaluation. \ 3. Advanced disc space height loss at L2-L3. 4. See separate report for findings in the chest, abdomen, and pelvis. Electronically Signed   By: Minerva Fester M.D.   On: 07/28/2023 18:50   CT CHEST ABDOMEN PELVIS W CONTRAST Result Date: 07/28/2023 CLINICAL DATA:  Altered mental status for 1 week and back pain after a fall on December 1st. EXAM: CT CHEST, ABDOMEN, AND PELVIS WITH CONTRAST TECHNIQUE: Multidetector CT imaging of the chest, abdomen and pelvis was performed following the standard protocol during bolus administration of intravenous contrast. RADIATION DOSE REDUCTION: This exam was performed according to the departmental dose-optimization program which includes automated exposure control, adjustment of the mA and/or kV according to patient size and/or use of iterative reconstruction technique. CONTRAST:  75mL OMNIPAQUE IOHEXOL 350 MG/ML SOLN COMPARISON:  CT abdomen and pelvis 01/14/2008 and chest radiographs 12/07/2014 FINDINGS: CT CHEST FINDINGS Cardiovascular:  Normal heart size. Small pericardial effusion. Pericardial thickening where it abuts the large left suprahilar mass. Coronary artery and aortic atherosclerotic calcification. The left upper lobe pulmonary arteries are not well visualized and may be compressed or occluded by the mass. Mediastinum/Nodes: Large heterogenous mass in the left suprahilar region extending into the left upper lobe. Mediastinal, bilateral hilar, and left axillary lymphadenopathy.  For example a subcarinal node measures 2.5 cm (series 3/image 27); 1.3 cm right hilar node on series 3/image 34; and a left axillary node measures 2.2 cm (3/19). Trachea and esophagus are unremarkable. Lungs/Pleura: Large heterogenous mass left upper lobe/suprahilar mass consistent with malignancy measuring 12.1 x 8.7 by 12.1 cm. There are a few small locules of gas in the superior portion of the mass which may represent cavitation. The left upper lobe bronchus is occluded by the mass. Diffuse bronchial wall thickening in the lingula and left lower lobe with centrilobular micro nodules and tree-in-bud opacities. 1.6 cm subpleural nodule in the anterior lingula (series 4/image 90). Small left pleural effusion. Multiple pulmonary nodules in the right lung consistent with metastases. For example 12 mm nodule in the posterior right lower lobe (4/128) and 9 mm nodule in the right upper lobe (4/44). Mild emphysema in the right. No right pleural effusion. No pneumothorax. Musculoskeletal: No acute fracture.  No destructive osseous lesion. CT ABDOMEN PELVIS FINDINGS Hepatobiliary: No focal hepatic lesion. Layering sludge in the gallbladder. No biliary dilation or evidence of cholecystitis. Pancreas: Unremarkable. Spleen: Unremarkable. Adrenals/Urinary Tract: Normal adrenal glands. Heterogenous attenuation of the left kidney. Focal geographic hypoattenuation in the posterior right kidney. Mild asymmetric left-greater-than-right perinephric stranding. Findings are compatible  with pyelonephritis. No urinary calculi or hydronephrosis. Unremarkable bladder. Stomach/Bowel: Normal caliber large and small bowel without bowel wall thickening. Stomach and appendix are within normal limits. Colonic diverticulosis without diverticulitis. Vascular/Lymphatic: Aortic atherosclerosis. 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 The stent in the right superficial femoral artery is not definitively opacified and may be occluded. No enlarged abdominal or pelvic lymph nodes. Reproductive: No acute abnormality. Other: No free intraperitoneal fluid or air. Musculoskeletal: No acute fracture or destructive osseous lesion in the pelvis. See separate report for findings in the lumbar spine. IMPRESSION: CHEST: 1. Large heterogenous mass in the left upper lobe/suprahilar region consistent with malignancy. 2. Diffuse bronchial wall thickening in the lingula and left lower lobe. Differential considerations include lymphangitic spread of tumor or airway infection/inflammation. 3. Multiple pulmonary nodules in the right lung consistent with metastases. 4. Mediastinal, bilateral hilar, and left axillary lymphadenopathy consistent with metastases. 5. Small left pleural effusion. ABDOMEN/PELVIS: 1. Bilateral pyelonephritis, left-greater-than-right. 2. The stent in the right superficial femoral artery is not definitively opacified and may be occluded though this is incompletely evaluated on non arterial phase exam. 3. 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 Aortic Atherosclerosis (ICD10-I70.0) and Emphysema (ICD10-J43.9). Electronically Signed   By: Minerva Fester M.D.   On: 07/28/2023 18:16   CT Head Wo Contrast Result Date: 07/28/2023 CLINICAL DATA:  Delirium EXAM: CT HEAD WITHOUT CONTRAST TECHNIQUE: Contiguous axial images were  obtained from the base of the skull through the vertex without intravenous contrast. RADIATION DOSE REDUCTION: This exam was performed according to the departmental dose-optimization program which includes automated exposure control, adjustment of the mA and/or kV according to patient size and/or use of iterative reconstruction technique. COMPARISON:  CT of the head December 07, 2014. FINDINGS: Brain: Complex cystic and solid 3.4 cm mass in the left parietal lobe. This lesion has some mild hyperdensity associated with it which could represent mineralization and or hemorrhage. Surrounding edema and mild regional mass effect without midline shift. No evidence of acute large vascular territory infarct, mass occupying acute hemorrhage outside the  above lesion, midline shift or hydrocephalus. Vascular: No hyperdense vessel identified. Skull: Normal. Negative for fracture or focal lesion. Sinuses/Orbits: Mostly clear sinuses.  No acute orbital findings. IMPRESSION: 1. Cystic and solid 3.4 cm mass in the left parietal lobe, which is concerning for a metastasis given the findings on the forthcoming CT of the chest/abdomen/pelvis. Recommend MRI of the head with contrast to further characterize and to assess for other lesions. 2. Surrounding edema and mild regional mass effect without midline shift. Electronically Signed   By: Feliberto Harts M.D.   On: 07/28/2023 18:02   Mr. Pricilla Holm was interviewed and examined.  He was alert and oriented when I saw him this morning.  He continues to have pain at the left chest wall and tenderness in the left axilla.  He underwent a diagnostic bronchoscopy today.  We will follow-up on the pathology report and make treatment recommendations.  I contacted radiation oncology.  They will see Mr. Pricilla Holm in consultation to consider radiation to the brain and potentially radiation to the left chest/lumbar spine.  Recommendations: Continue intravenous hydration, follow-up calcium  level Continue Decadron, can convert to oral dosing Anticoagulation for the pulmonary embolism Hydrocodone/morphine as needed for pain Radiation oncology consult-I contacted Dr. Kathrynn Running Follow-up pathology from the 07/30/2023 bronchoscopy Outpatient follow-up will be [facility]

## 2023-07-30 NOTE — Evaluation (Signed)
Occupational Therapy Evaluation Patient Details Name: Cody Pena MRN: [id number] DOB: Dec 08, 1960 Today's Date: 07/30/2023   History of Present Illness 62 y.o. male who presents with AMS x1 week and back pain, new diagnosis of cancer, anuerysm, past CVA, brain mass, LUL mass, and PE. Pt had recent fall 12/1.   PMHx:  Covid, anxiety/depression, uncontrolled DM, HTN, PVD, cervical spine fusion, peripheral neuropathy, OA, R BKA   Clinical Impression   Pt c/o back pain 7/10, dizziness with sitting up, states that he has poor memory and needs to write things down. Pt poor historian, has difficulty answering questions, A/O to self, inconsistent with following simple commands. Pt states he lives at home with wife/daughter who assist with ADLs, IADLs, uses w/c around home, has ramp to get into home. Pt currently not able to balance while sitting on EOB, B hands for support, VSS sitting EOB. Pt returned to supine and O2 dropped on room air to 82%, recovered with O2 [place] supplementation. Pt would benefit from continued acute care to progress as able, DC to postacute rehab <3hrs/day appropriate at this time.        If plan is discharge home, recommend the following: A lot of help with walking and/or transfers;A lot of help with bathing/dressing/bathroom;Assistance with cooking/housework;Direct supervision/assist for medications management;Help with stairs or ramp for entrance;Assist for transportation;Supervision due to cognitive status    Functional Status Assessment  Patient has had a recent decline in their functional status and demonstrates the ability to make significant improvements in function in a reasonable and predictable amount of time.  Equipment Recommendations  Other (comment) (defer)    Recommendations for Other Services       Precautions / Restrictions Precautions Precautions: Fall Precaution Comments: R BKA 2023 Restrictions Weight Bearing Restrictions Per Provider Order: No       Mobility Bed Mobility Overal bed mobility: Needs Assistance Bed Mobility: Supine to Sit, Sit to Supine     Supine to sit: Min assist Sit to supine: Min assist   General bed mobility comments: min A with verbal cueing for task initiation, sequencing    Transfers Overall transfer level: Needs assistance                 General transfer comment: did not attempt, significant dizziness upon sitting up      Balance Overall balance assessment: Needs assistance Sitting-balance support: Bilateral upper extremity supported, Feet supported Sitting balance-[name]: Poor Sitting balance - Comments: very dizzy, B hands for support                                   ADL either performed or assessed with clinical judgement   ADL Overall ADL's : Needs assistance/impaired Eating/Feeding: NPO   Grooming: Minimal assistance;Sitting;Cueing for sequencing   Upper Body Bathing: Moderate assistance;Sitting;Cueing for sequencing   Lower Body Bathing: Maximal assistance;Sitting/lateral leans;Cueing for sequencing   Upper Body Dressing : Moderate assistance;Sitting;Cueing for sequencing   Lower Body Dressing: Maximal assistance;Cueing for sequencing;Sitting/lateral leans                 General ADL Comments: Pt dizzy upon sitting up, difficulty balancing on EOB, did not attempt transfer or STS. mod-max for ADLs, difficulty following commands     Vision Baseline Vision/History: 1 Wears glasses Ability to See in Adequate Light: 1 Impaired Patient Visual Report: No change from baseline       Perception  Praxis         Pertinent Vitals/Pain Pain Assessment Pain Assessment: 0-10 Pain Score: 7  Pain Location: back Pain Descriptors / Indicators: Discomfort Pain Intervention(s): Monitored during session     Extremity/Trunk Assessment Upper Extremity Assessment Upper Extremity Assessment: Generalized weakness           Communication  Communication Communication: Difficulty following commands/understanding Following commands: Follows one step commands consistently   Cognition Arousal: Alert Behavior During Therapy: WFL for tasks assessed/performed Overall Cognitive Status: No family/caregiver present to determine baseline cognitive functioning                                 General Comments: Pt states it's Dec 2015, oriented to self. Pt has difficulty with answering orientation questions and following directions consistently, confused throughout session.     General Comments       Exercises     Shoulder Instructions      Home Living Family/patient expects to be discharged to:: Private residence Living Arrangements: Spouse/significant other;Children Available Help at Discharge: Family;Available PRN/intermittently Type of Home: House Home Access: Ramped entrance     Home Layout: One level     Bathroom Shower/Tub: Chief Strategy Officer: Standard     Home Equipment: Wheelchair - manual;Shower seat   Additional Comments: Pt states he lives with wife and daughter who work part time.      Prior Functioning/Environment Prior Level of Function : Independent/Modified Independent;Driving             Mobility Comments: uses w/c, states he uses prosthetic sometimes, unclear on how often or well he uses it ADLs Comments: wife helps with dressing/bathing, cooking/cleaning.        OT Problem List: Decreased strength;Decreased range of motion;Decreased activity tolerance;Impaired balance (sitting and/or standing);Decreased cognition;Decreased safety awareness;Pain      OT Treatment/Interventions: Self-care/ADL training;Therapeutic exercise;Energy conservation;DME and/or AE instruction;Therapeutic activities;Patient/family education;Balance training    OT Goals(Current goals can be found in the care plan section) Acute Rehab OT Goals Patient Stated Goal: unable to participate in  goal setting OT Goal Formulation: Patient unable to participate in goal setting Time For Goal Achievement: 08/13/23 Potential to Achieve Goals: Fair  OT Frequency: Min 1X/week    Co-evaluation              AM-PAC OT "6 Clicks" Daily Activity     Outcome Measure Help from another person eating meals?: Total (NPO) Help from another person taking care of personal grooming?: A Lot Help from another person toileting, which includes using toliet, bedpan, or urinal?: A Lot Help from another person bathing (including washing, rinsing, drying)?: A Lot Help from another person to put on and taking off regular upper body clothing?: A Lot Help from another person to put on and taking off regular lower body clothing?: A Lot 6 Click Score: 11   End of Session Nurse Communication: Mobility status  Activity Tolerance: Patient limited by fatigue;Other (comment) (dizziness) Patient left: in bed;with call bell/phone within reach;with bed alarm set  OT Visit Diagnosis: Other abnormalities of gait and mobility (R26.89);Unsteadiness on feet (R26.81);Muscle weakness (generalized) (M62.81);Other symptoms and signs involving cognitive function;Pain;Dizziness and giddiness (R42) Pain - part of body:  (back)                Time: 1306-1330 OT Time Calculation (min): 24 min Charges:  OT Evaluation $OT Eval Moderate Complexity: 1 Mod OT Treatments $  Self Care/Home Management : 8-22 mins  [facility], OTR/L   Alexis Goodell 07/30/2023, 1:57 PM

## 2023-07-30 NOTE — Op Note (Signed)
Video Bronchoscopy Procedure Note  Date of Operation: 07/30/2023  Pre-op Diagnosis: LUL lung mass   Post-op Diagnosis: LUL lung mass   Surgeon: Josephine Igo ,DO   Assistants: none  Anesthesia: Genera  Operation: Flexible video fiberoptic bronchoscopy and biopsies.  Estimated Blood Loss: <5cc  Complications: none noted  Indications and History: Cody Pena is 62 y.o. with history of LUL lung mass.  Recommendation was to perform video fiberoptic bronchoscopy with biopsies. The risks, benefits, complications, treatment options and expected outcomes were discussed with the patient.  The possibilities of pneumothorax, pneumonia, reaction to medication, pulmonary aspiration, perforation of a viscus, bleeding, failure to diagnose a condition and creating a complication requiring transfusion or operation were discussed with the patient who freely signed the consent.    Description of Procedure: The patient was seen in the Preoperative Area, was examined and was deemed appropriate to proceed.  The patient was taken to [facility] endo 3, identified as Cody Pena and the procedure verified as Flexible Video Fiberoptic Bronchoscopy.  A Time Out was held and the above information confirmed.   Standard therapeutic scope was inserted into the patient's airways and the bilateral mainstem examined.  The right lung appears normal however the anterior wall of the right mainstem there are scattered appearance concerning for tumor metastasis.  There is also visible obstructing necrotic appearing tumor coming from the left upper lobe, the entire left upper lobe segment is completely obliterated with tumor.  Using 1: 10,000 dilution epinephrine that the tumor bed was saturated and caused blanching of the mucosa.  Using 2.8 mm forceps were able to obtain biopsies from the endobronchial tumor.  Samples: 1.  Endobronchial forcep biopsies left upper lobe.  Plans:  We will review the cytology, pathology  results with the patient when they become available.  Outpatient followup will be with Dr. Josephine Igo, DO   Josephine Igo, DO [facility] 07/30/2023 10:42 AM

## 2023-07-30 NOTE — Progress Notes (Signed)
PT Cancellation Note  Patient Details Name: Cody Pena MRN: [id number] DOB: 02-Oct-1960   Cancelled Treatment:    Reason Eval/Treat Not Completed: Patient at procedure or test/unavailable. Will re-attempt later today as time allows.    [name] 07/30/2023, 10:43 AM

## 2023-07-30 NOTE — Progress Notes (Signed)
Patient returns to [id number] at this time

## 2023-07-30 NOTE — Evaluation (Signed)
Physical Therapy Evaluation Patient Details Name: Cody Pena MRN: [id number] DOB: 1960-09-30 Today's Date: 07/30/2023  History of Present Illness  62 y.o. male who presents with AMS x1 week and back pain, new diagnosis of cancer, anuerysm, past CVA, brain mass, LUL mass, and PE. Pt had recent fall 12/1.   PMHx:  Covid, anxiety/depression, uncontrolled DM, HTN, PVD, cervical spine fusion, peripheral neuropathy, OA, R BKA   Clinical Impression  Patient received in bed,OT present in room. Patient is agreeable to assessment. He is tangential with speech and thoughts. Difficult to get straight answers at times. Patient requires min A for bed mobility due to dizziness with sitting up. He is fearful of falling and unable to let go of bed with sitting. Patient unable to attempt transfers at this time. He will continue to benefit from skilled PT to improve independence.         If plan is discharge home, recommend the following: Two people to help with walking and/or transfers;A lot of help with bathing/dressing/bathroom;Assistance with feeding;Direct supervision/assist for medications management;Direct supervision/assist for financial management   Can travel by private vehicle   No    Equipment Recommendations Other (comment) (TBD)  Recommendations for Other Services       Functional Status Assessment Patient has had a recent decline in their functional status and/or demonstrates limited ability to make significant improvements in function in a reasonable and predictable amount of time     Precautions / Restrictions Precautions Precautions: Fall Precaution Comments: R BKA 2023 Restrictions Weight Bearing Restrictions Per Provider Order: No      Mobility  Bed Mobility Overal bed mobility: Needs Assistance Bed Mobility: Supine to Sit, Sit to Supine     Supine to sit: Min assist Sit to supine: Min assist   General bed mobility comments: min A with verbal cueing for task  initiation, sequencing. Patient reports significant dizziness with sitting up on edge of bed. Requiring hands on assist and use of bed rails    Transfers                   General transfer comment: did not attempt, significant dizziness upon sitting up    Ambulation/Gait               General Gait Details: unable to attempt  Stairs            Wheelchair Mobility     Tilt Bed    Modified Rankin (Stroke Patients Only)       Balance Overall balance assessment: Needs assistance Sitting-balance support: Feet supported, Bilateral upper extremity supported Sitting balance-[name]: Poor Sitting balance - Comments: very dizzy, B hands for support                                     Pertinent Vitals/Pain Pain Assessment Pain Assessment: Faces Faces Pain Scale: Hurts a little bit Pain Location: back Pain Descriptors / Indicators: Discomfort, Grimacing Pain Intervention(s): Monitored during session    Home Living Family/patient expects to be discharged to:: Private residence Living Arrangements: Spouse/significant other Available Help at Discharge: Family;Available PRN/intermittently Type of Home: House Home Access: Ramped entrance       Home Layout: One level Home Equipment: Wheelchair - manual;Shower seat Additional Comments: Pt states he lives with wife and daughter who work part time.    Prior Function Prior Level of Function : Independent/Modified Independent;Driving  Mobility Comments: uses w/c, states he uses prosthetic sometimes, unclear on how often or well he uses it ADLs Comments: wife helps with dressing/bathing, cooking/cleaning.     Extremity/Trunk Assessment   Upper Extremity Assessment Upper Extremity Assessment: Defer to OT evaluation    Lower Extremity Assessment Lower Extremity Assessment: Generalized weakness    Cervical / Trunk Assessment Cervical / Trunk Assessment: Normal (pain in  back)  Communication   Communication Communication: Difficulty following commands/understanding;Difficulty communicating thoughts/reduced clarity of speech Following commands: Follows one step commands inconsistently;Follows one step commands with increased time Cueing Techniques: Verbal cues;Gestural cues  Cognition Arousal: Alert Behavior During Therapy: WFL for tasks assessed/performed Overall Cognitive Status: Impaired/Different from baseline Area of Impairment: Orientation, Following commands, Awareness, Problem solving                 Orientation Level: Disoriented to, Time, Situation     Following Commands: Follows one step commands with increased time, Follows one step commands inconsistently   Awareness: Intellectual Problem Solving: Slow processing, Difficulty sequencing, Requires verbal cues, Requires tactile cues General Comments: Pt states it's Dec 2015, oriented to self. Pt has difficulty with answering orientation questions and following directions consistently, confused throughout session.        General Comments      Exercises     Assessment/Plan    PT Assessment Patient needs continued PT services  PT Problem List Decreased strength;Decreased activity tolerance;Decreased balance;Decreased mobility;Decreased cognition;Decreased safety awareness;Cardiopulmonary status limiting activity;Pain       PT Treatment Interventions DME instruction;Functional mobility training;Therapeutic activities;Therapeutic exercise;Balance training;Neuromuscular re-education;Cognitive remediation;Patient/family education    PT Goals (Current goals can be found in the Care Plan section)  Acute Rehab PT Goals Patient Stated Goal: to improve PT Goal Formulation: With patient Time For Goal Achievement: 08/13/23 Potential to Achieve Goals: Fair    Frequency Min 1X/week     Co-evaluation PT/OT/SLP Co-Evaluation/Treatment: Yes Reason for Co-Treatment: To address  functional/ADL transfers PT goals addressed during session: Mobility/safety with mobility;Balance         AM-PAC PT "6 Clicks" Mobility  Outcome Measure Help needed turning from your back to your side while in a flat bed without using bedrails?: A Little Help needed moving from lying on your back to sitting on the side of a flat bed without using bedrails?: A Lot Help needed moving to and from a bed to a chair (including a wheelchair)?: Total Help needed standing up from a chair using your arms (e.g., wheelchair or bedside chair)?: Total Help needed to walk in hospital room?: Total Help needed climbing 3-5 steps with a railing? : Total 6 Click Score: 9    End of Session Equipment Utilized During Treatment: Oxygen Activity Tolerance: Other (comment) (limited by dizziness) Patient left: in bed;with call bell/phone within reach;with bed alarm set Nurse Communication: Mobility status PT Visit Diagnosis: Muscle weakness (generalized) (M62.81);Other abnormalities of gait and mobility (R26.89);History of falling (Z91.81);Dizziness and giddiness (R42)    Time: 1610-9604 PT Time Calculation (min) (ACUTE ONLY): 8 min   Charges:   PT Evaluation $PT Eval Moderate Complexity: 1 Mod   PT General Charges $$ ACUTE PT VISIT: 1 Visit         Cathy Ropp, PT, GCS 07/30/23,2:15 PM

## 2023-07-30 NOTE — Interval H&P Note (Signed)
History and Physical Interval Note:  07/30/2023 9:49 AM  Cody Pena  has presented today for surgery, with the diagnosis of lung mass.  The various methods of treatment have been discussed with the patient and family. After consideration of risks, benefits and other options for treatment, the patient has consented to  Procedure(s): VIDEO BRONCHOSCOPY WITH FLUORO (N/A) as a surgical intervention.  The patient's history has been reviewed, patient examined, no change in status, stable for surgery.  I have reviewed the patient's chart and labs.  Questions were answered to the patient's satisfaction.     [name]

## 2023-07-30 NOTE — Progress Notes (Signed)
Heart Failure Navigator Progress Note  Assessed for Heart & Vascular TOC clinic readiness.  Patient does not meet criteria due to .- suspected widely metastatic cancer   Navigator will sign off at this time.   Rhae Hammock, BSN, Scientist, clinical (histocompatibility and immunogenetics) Only

## 2023-07-31 ENCOUNTER — Other Ambulatory Visit (HOSPITAL_COMMUNITY): Payer: Self-pay

## 2023-07-31 ENCOUNTER — Encounter: Payer: Self-pay | Admitting: Radiation Oncology

## 2023-07-31 ENCOUNTER — Ambulatory Visit
Admit: 2023-07-31 | Discharge: 2023-07-31 | Disposition: A | Discharge status: Home / Self Care | Payer: Medicaid Other | Attending: Radiation Oncology | Admitting: Radiation Oncology

## 2023-07-31 ENCOUNTER — Telehealth (HOSPITAL_COMMUNITY): Payer: Self-pay | Admitting: Pharmacy Technician

## 2023-07-31 DIAGNOSIS — I5020 Unspecified systolic (congestive) heart failure: Secondary | ICD-10-CM | POA: Diagnosis not present

## 2023-07-31 DIAGNOSIS — M545 Low back pain, unspecified: Secondary | ICD-10-CM

## 2023-07-31 DIAGNOSIS — R918 Other nonspecific abnormal finding of lung field: Secondary | ICD-10-CM

## 2023-07-31 DIAGNOSIS — Z7189 Other specified counseling: Secondary | ICD-10-CM

## 2023-07-31 DIAGNOSIS — Z515 Encounter for palliative care: Secondary | ICD-10-CM | POA: Diagnosis not present

## 2023-07-31 DIAGNOSIS — A419 Sepsis, unspecified organism: Secondary | ICD-10-CM | POA: Diagnosis not present

## 2023-07-31 DIAGNOSIS — I639 Cerebral infarction, unspecified: Secondary | ICD-10-CM

## 2023-07-31 DIAGNOSIS — G9389 Other specified disorders of brain: Secondary | ICD-10-CM

## 2023-07-31 DIAGNOSIS — R6520 Severe sepsis without septic shock: Secondary | ICD-10-CM | POA: Diagnosis not present

## 2023-07-31 DIAGNOSIS — G9341 Metabolic encephalopathy: Secondary | ICD-10-CM | POA: Diagnosis not present

## 2023-07-31 DIAGNOSIS — I4891 Unspecified atrial fibrillation: Secondary | ICD-10-CM | POA: Diagnosis not present

## 2023-07-31 DIAGNOSIS — N12 Tubulo-interstitial nephritis, not specified as acute or chronic: Secondary | ICD-10-CM | POA: Diagnosis not present

## 2023-07-31 LAB — CBC WITH DIFFERENTIAL/PLATELET
Abs Immature Granulocytes: 0.14 10*3/uL — ABNORMAL HIGH (ref 0.00–0.07)
Basophils Absolute: 0.1 10*3/uL (ref 0.0–0.1)
Basophils Relative: 0 %
Eosinophils Absolute: 0 10*3/uL (ref 0.0–0.5)
Eosinophils Relative: 0 %
HCT: 40 % (ref 39.0–52.0)
Hemoglobin: 12.7 g/dL — ABNORMAL LOW (ref 13.0–17.0)
Immature Granulocytes: 1 %
Lymphocytes Relative: 3 %
Lymphs Abs: 0.6 10*3/uL — ABNORMAL LOW (ref 0.7–4.0)
MCH: 26.6 pg (ref 26.0–34.0)
MCHC: 31.8 g/dL (ref 30.0–36.0)
MCV: 83.7 fL (ref 80.0–100.0)
Monocytes Absolute: 1 10*3/uL (ref 0.1–1.0)
Monocytes Relative: 5 %
Neutro Abs: 17.8 10*3/uL — ABNORMAL HIGH (ref 1.7–7.7)
Neutrophils Relative %: 91 %
Platelets: 499 10*3/uL — ABNORMAL HIGH (ref 150–400)
RBC: 4.78 MIL/uL (ref 4.22–5.81)
RDW: 14.6 % (ref 11.5–15.5)
WBC: 19.6 10*3/uL — ABNORMAL HIGH (ref 4.0–10.5)
nRBC: 0 % (ref 0.0–0.2)

## 2023-07-31 LAB — COMPREHENSIVE METABOLIC PANEL
ALT: 12 U/L (ref 0–44)
AST: 19 U/L (ref 15–41)
Albumin: 2.4 g/dL — ABNORMAL LOW (ref 3.5–5.0)
Alkaline Phosphatase: 80 U/L (ref 38–126)
Anion gap: 13 (ref 5–15)
BUN: 30 mg/dL — ABNORMAL HIGH (ref 8–23)
CO2: 23 mmol/L (ref 22–32)
Calcium: 11.7 mg/dL — ABNORMAL HIGH (ref 8.9–10.3)
Chloride: 100 mmol/L (ref 98–111)
Creatinine, Ser: 0.9 mg/dL (ref 0.61–1.24)
GFR, Estimated: >60 mL/min (ref >60)
Glucose, Bld: 154 mg/dL — ABNORMAL HIGH (ref 70–99)
Potassium: 4.3 mmol/L (ref 3.5–5.1)
Sodium: 136 mmol/L (ref 135–145)
Total Bilirubin: 0.6 mg/dL (ref <1.2)
Total Protein: 6.4 g/dL — ABNORMAL LOW (ref 6.5–8.1)

## 2023-07-31 LAB — BASIC METABOLIC PANEL
Anion gap: 14 (ref 5–15)
BUN: 32 mg/dL — ABNORMAL HIGH (ref 8–23)
CO2: 21 mmol/L — ABNORMAL LOW (ref 22–32)
Calcium: 12.1 mg/dL — ABNORMAL HIGH (ref 8.9–10.3)
Chloride: 101 mmol/L (ref 98–111)
Creatinine, Ser: 0.91 mg/dL (ref 0.61–1.24)
GFR, Estimated: >60 mL/min (ref >60)
Glucose, Bld: 153 mg/dL — ABNORMAL HIGH (ref 70–99)
Potassium: 4.8 mmol/L (ref 3.5–5.1)
Sodium: 136 mmol/L (ref 135–145)

## 2023-07-31 LAB — GLUCOSE, CAPILLARY
Glucose-Capillary: 238 mg/dL — ABNORMAL HIGH (ref 70–99)
Glucose-Capillary: 239 mg/dL — ABNORMAL HIGH (ref 70–99)
Glucose-Capillary: 154 mg/dL — ABNORMAL HIGH (ref 70–99)
Glucose-Capillary: 88 mg/dL (ref 70–99)

## 2023-07-31 LAB — BRAIN NATRIURETIC PEPTIDE
B Natriuretic Peptide: 516.6 pg/mL — ABNORMAL HIGH (ref 0.0–100.0)
B Natriuretic Peptide: 219.9 pg/mL — ABNORMAL HIGH (ref 0.0–100.0)

## 2023-07-31 LAB — HEPARIN LEVEL (UNFRACTIONATED): Heparin Unfractionated: <0.1 [IU]/mL — ABNORMAL LOW (ref 0.30–0.70)

## 2023-07-31 LAB — MAGNESIUM
Magnesium: 1.9 mg/dL (ref 1.7–2.4)
Magnesium: 2 mg/dL (ref 1.7–2.4)

## 2023-07-31 LAB — PHOSPHORUS: Phosphorus: 2.3 mg/dL — ABNORMAL LOW (ref 2.5–4.6)

## 2023-07-31 LAB — PROCALCITONIN: Procalcitonin: <0.1 ng/mL

## 2023-07-31 LAB — C-REACTIVE PROTEIN: CRP: 8.6 mg/dL — ABNORMAL HIGH (ref <1.0)

## 2023-07-31 MED ORDER — DILTIAZEM HCL 25 MG/5ML IV SOLN
10.0000 mg | Freq: Four times a day (QID) | INTRAVENOUS | Status: DC | PRN
Start: 2023-07-31 — End: 2023-08-06

## 2023-07-31 MED ORDER — QUETIAPINE FUMARATE 25 MG PO TABS
25.0000 mg | ORAL_TABLET | Freq: Two times a day (BID) | ORAL | Status: DC
  Administered 2023-07-31 – 2023-08-05 (×11): 25 mg via ORAL
  Filled 2023-07-31 (×11): qty 1

## 2023-07-31 MED ORDER — SODIUM CHLORIDE 0.9 % IV SOLN
INTRAVENOUS | Status: DC
Start: 2023-07-31 — End: 2023-08-01

## 2023-07-31 MED ORDER — METOPROLOL TARTRATE 5 MG/5ML IV SOLN
10.0000 mg | Freq: Four times a day (QID) | INTRAVENOUS | Status: DC
  Administered 2023-07-31: 10 mg via INTRAVENOUS
  Filled 2023-07-31: qty 10

## 2023-07-31 MED ORDER — DILTIAZEM HCL 60 MG PO TABS: 60.0000 mg | ORAL_TABLET | Freq: Three times a day (TID) | ORAL | Status: DC

## 2023-07-31 MED ORDER — APIXABAN 5 MG PO TABS
5.0000 mg | ORAL_TABLET | Freq: Two times a day (BID) | ORAL | Status: DC
  Administered 2023-07-31 – 2023-08-05 (×11): 5 mg via ORAL
  Filled 2023-07-31 (×11): qty 1

## 2023-07-31 MED ORDER — METOPROLOL TARTRATE 50 MG PO TABS: 50.0000 mg | ORAL_TABLET | Freq: Two times a day (BID) | ORAL | Status: DC

## 2023-07-31 MED ORDER — METOPROLOL TARTRATE 50 MG PO TABS
75.0000 mg | ORAL_TABLET | Freq: Two times a day (BID) | ORAL | Status: DC
  Administered 2023-07-31 – 2023-08-05 (×11): 75 mg via ORAL
  Filled 2023-07-31 (×11): qty 1

## 2023-07-31 MED ORDER — CALCITONIN (SALMON) 200 UNIT/ML IJ SOLN
4.0000 [IU]/kg | Freq: Two times a day (BID) | INTRAMUSCULAR | Status: AC
  Administered 2023-07-31 – 2023-08-01 (×2): 310 [IU] via SUBCUTANEOUS
  Filled 2023-07-31 (×4): qty 1.55

## 2023-07-31 NOTE — Anesthesia Postprocedure Evaluation (Signed)
Anesthesia Post Note  Patient: Cody Pena  Procedure(s) Performed: VIDEO BRONCHOSCOPY WITHOUT FLUORO HEMOSTASIS CONTROL BRONCHIAL BIOPSIES     Patient location during evaluation: PACU Anesthesia Type: General Level of consciousness: awake and alert Pain management: pain level controlled Vital Signs Assessment: post-procedure vital signs reviewed and stable Respiratory status: spontaneous breathing, nonlabored ventilation and respiratory function stable Cardiovascular status: blood pressure returned to baseline and stable Postop Assessment: no apparent nausea or vomiting Anesthetic complications: no   There were no known notable events for this encounter.  Last Vitals:  Vitals:   07/31/23 0200 07/31/23 0400  BP: 131/73 136/72  Pulse: (!) 109 (!) 115  Resp: (!) 21 19  Temp:  36.7 C  SpO2: 100% 93%    Last Pain:  Vitals:   07/31/23 0517  TempSrc:   PainSc: 8                  Lowella Curb

## 2023-07-31 NOTE — Progress Notes (Signed)
Pharmacy Antibiotic Note  Cody Pena is a 62 y.o. male admitted on 07/28/2023 with sepsis 2/2 suspected pyelonephritis.  Pharmacy has been consulted for Cefepime dosing.  D4 abx - WBC up to 19.6, afebrile. Renal function stable.  Plan: Continue cefepime 2g IV q8h  Monitor renal function and clinical signs of improvement  F/u LOT, cultures, and de-escalate antibiotics as able   Height: 6\' 3"  (190.5 cm) Weight: 77.7 kg (171 lb 4.8 oz) IBW/kg (Calculated) : 84.5  Temp (24hrs), Avg:97.8 F (36.6 C), Min:97.2 F (36.2 C), Max:98.6 F (37 C)  Recent Labs  Lab 07/28/23 1548 07/28/23 1553 07/28/23 1801 07/28/23 2304 07/29/23 0422 07/30/23 0422 07/30/23 2326 07/31/23 0233  WBC 18.6*  --   --   --  18.7* 18.2*  --  19.6*  CREATININE 1.01  --   --   --  1.19 0.90 0.91 0.90  LATICACIDVEN  --  3.7* 2.0* 1.6  --   --   --   --     Estimated Creatinine Clearance: 91.1 mL/min (by C-G formula based on SCr of 0.9 mg/dL).    Allergies  Allergen Reactions   Ceftriaxone Itching, Nausea And Vomiting and Rash    Noted minutes after starting medication No rash noted with Ancef on 01-31-2022 during general anesthesia Has received cefepime and cefazolin without reaction    Antimicrobials this admission: Cefepime 12/15 >>  Vancomycin 12/15 x1   Dose adjustments this admission: N/A  Microbiology results: 12/15 Bcx x2: NGTD3  Thank you for allowing pharmacy to be a part of this patient's care. Pharmacy will continue to follow peripherally.  Nicole Kindred, PharmD PGY1 Pharmacy Resident 07/31/2023 7:43 AM

## 2023-07-31 NOTE — Progress Notes (Signed)
Late entry 1000  - Patient with soft wrist restraints and mittens on.  IV right forearm with Heparin drip found infiltrated.  Removed intact and care given.  Tolerated meals without difficulty and swallows pill .  Alert, complains of chest pain rating 10/10.  NP in room and notified.  Stated came in with chest pain.  See new meds givens.

## 2023-07-31 NOTE — Telephone Encounter (Signed)
Patient Product/process development scientist completed.    The patient is insured through [facility].     Ran test claim for Eliquis 5 mg and the current 30 day co-pay is $4.00.   This test claim was processed through [facility]- copay amounts may vary at other pharmacies due to pharmacy/plan contracts, or as the patient moves through the different stages of their insurance plan.     Roland Earl, CPHT Pharmacy Technician III Certified Patient Advocate [facility] Pharmacy Patient Advocate Team Direct Number: [phone]  Fax: [phone]

## 2023-07-31 NOTE — Consult Note (Signed)
Palliative Care Consult Note                                  Date: 07/31/2023   Patient Name: Cody Pena  DOB: 1961-02-10  MRN: [id number]  Age / Sex: 62 y.o., male  [facility]P: [facility] Referring Physician: Leroy Sea, MD  Reason for Consultation: Establishing goals of care  HPI/Patient Profile: 62 y.o. male  with past medical history of peripheral vascular disease s/p BKA, hypertension, insulin-dependent type 2 diabetes, and hyperlipidemia who presents with altered mental status and back pain. 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   PMT was consulted for GOC conversations.  Past Medical History:  Diagnosis Date   Arthritis    COVID    has had Covid 3 times, not sick with it   COVID    has had 3 times mild to no symptoms   Depression    Diabetes mellitus without complication (HCC)    pt states he has now been told that he is a type 1 diabetic.   Dyspnea    albuterol inhaler   HLD (hyperlipidemia)    Hypertension    Peripheral vascular disease (HCC)     Subjective:   This NP Wynne Dust reviewed medical records, received report from team, assessed the patient and then meet at the patient's bedside to discuss diagnosis, prognosis, GOC, EOL wishes disposition and options.  I met with the patient at the bedside, although he is sleeping.  I spoke with his nurse who states that he just received medicine and is sleeping for the first time since he was admitted.  He has been intermittently confused and has mitts on for safety.  Because of his situation I elected to not wake him.  It is noted and other progress notes that the patient has requested his wife not be notified of his diagnosis.   We meet to discuss  diagnosis prognosis, GOC, EOL wishes, disposition and options. Concept of Palliative Care was introduced as specialized medical care for people and their families living with serious illness.  If focuses on providing relief from the symptoms and stress of a serious illness.  The goal is to improve quality of life for both the patient and the family. Values and goals of care important to patient and family were attempted to be elicited.  Created space and opportunity for patient  and family to explore thoughts and feelings regarding current medical situation   Natural trajectory and current clinical status were discussed. Questions and concerns addressed. Patient  encouraged to call with questions or concerns.    Patient/Family Understanding of Illness: Deferred  Life Review: Deferred  Patient Values: Deferred  Goals: Deferred  Today's Discussion: Report attempted to see the patient I spoke with the patient's nurse.  She states that she just give him medication and is deferred she has slept since yesterday.  She notes that he has been a bit worked up.  Other provider notes from today note the same thing, confusion requiring mittens, not as calm as yesterday.  Because of his somewhat agitated situation and confusion, now sleeping, I elected to not wake him to allow him to rest.  Hopefully steroids will improve his mentation  a bit over the coming days.  I reached out to the hospitalist Dr. Thedore Mins to discuss the patient.  He shares that the patient is okay with the speaking with his daughter [name].  I called [name] and introduced myself and the role of palliative medicine.  I discussed that we are here to help him as he is working with serious illness.  We are here to support understanding disease process, trajectory, and options.  We will help with discussions when trying to decide how to proceed with care.  I explained that at this time we are still limited in what we now (waiting for  pathology) and what our options may be (waiting for further workup from oncology and radiation oncology).  I shared my hope that the patient will be more alert in the coming days to be able to participate in these discussions.  Samantha asked if she could talk more with [place] tomorrow.  I told her I would send her our phone number and she can either call to schedule a time to meet in person or, if unable to come in person, to notify me that she is free and I will call and speak more with her on the phone.  I provided emotional and general support through therapeutic listening, empathy, sharing of stories, and other techniques. I answered all questions and addressed all concerns to the best of my ability.  Review of Systems  Unable to perform ROS   Objective:   Primary Diagnoses: Present on Admission:  HTN (hypertension)  Hyperlipidemia  Sepsis (HCC)  Lung mass   Physical Exam Vitals and nursing note reviewed.  Constitutional:      General: He is sleeping. He is not in acute distress. HENT:     Head: Normocephalic and atraumatic.  Pulmonary:     Effort: Pulmonary effort is normal. No respiratory distress.  Abdominal:     General: Abdomen is flat.     Vital Signs:  BP (!) 146/80 (BP Location: Right Arm)   Pulse (!) 104   Temp (!) 97.3 F (36.3 C) (Oral)   Resp 20   Ht 6\' 3"  (1.905 m)   Wt 77.7 kg   SpO2 93%   BMI 21.41 kg/m   Palliative Assessment/Data: 20-30%    Advanced Care Planning:   Existing Vynca/ACP Documentation: None  Primary Decision Maker: PATIENT  Code Status/Advance Care Planning: Full code  A discussion was had today regarding advanced directives. Concepts specific to code status, artifical feeding and hydration, continued IV antibiotics and rehospitalization was had.  The difference between a aggressive medical intervention path and a palliative comfort care path for this patient at this time was had.   Decisions/Changes to ACP: None  today  Assessment & Plan:   Impression: 62 year old male with acute presentation chronic comorbidities as described above.  The patient unfortunately has a slew of new diagnoses, mostly unrelated.  He has sepsis secondary to bilateral pyelonephritis with no obstruction or hydronephrosis note, currently being treated with antibiotics and fluids.  He was also unfortunately found to have a large lung mass suspicious for primary malignancy with apparent mets to the brain, spine, lymph nodes.  Bronc was completed and pathology is pending.  There is apparent associated vasogenic edema due to the brain mets.  He has significant altered mental status related to this as well.  He also has had a CVA without apparent deficit at this time and an acute PE for which he is currently on heparin.  The patient was clear to an earlier provider he does not want his wife notified of his diagnosis.  We have had difficulty doing his daughter.  Hopefully treatment will allow him to be more aware and able to discuss his goals of care.  SUMMARY OF RECOMMENDATIONS   Full code for now Continue workup and full scope of care Palliative medicine will continue to attempt to engage for GOC conversations Plans to speak more with the patient's daughter tomorrow morning  Symptom Management:  Per primary team PMT is available to assist as needed  Prognosis:  Unable to determine  Discharge Planning:  To Be Determined   Discussed with: Medical team, nursing team    Thank you for allowing [place] to participate in the care of JAHZIAH SICLARI PMT will continue to support holistically.  Time Total: 45 min  Detailed review of medical records (labs, imaging, vital signs), medically appropriate exam, discussed with treatment team, counseling and education to patient, family, & staff, documenting clinical information, medication management, coordination of care  Signed by: Wynne Dust, NP Palliative Medicine Team  Team Phone #  [phone] (Nights/Weekends)  07/31/2023, 1:36 PM

## 2023-07-31 NOTE — Progress Notes (Addendum)
PHARMACY - ANTICOAGULATION CONSULT NOTE  Pharmacy Consult for heparin > Eliquis Indication:  new onset atrial fibrillation and new subsegmental pulmonary embolism  Allergies  Allergen Reactions   Ceftriaxone Itching, Nausea And Vomiting and Rash    Noted minutes after starting medication No rash noted with Ancef on 01-31-2022 during general anesthesia Has received cefepime and cefazolin without reaction   Patient Measurements: Height: 6\' 3"  (190.5 cm) Weight: 77.7 kg (171 lb 4.8 oz) IBW/kg (Calculated) : 84.5 Heparin Dosing Weight: 77 kg  Vital Signs: Temp: 98 F (36.7 C) (12/18 0400) Temp Source: Oral (12/18 0400) BP: 136/72 (12/18 0400) Pulse Rate: 115 (12/18 0400)  Labs: Recent Labs    07/28/23 1548 07/28/23 1756 07/29/23 0422 07/29/23 1737 07/30/23 0114 07/30/23 0422 07/30/23 2326 07/31/23 0233  HGB 13.0  --  11.3*  --   --  11.1*  --  12.7*  HCT 40.9  --  35.7*  --   --  35.5*  --  40.0  PLT 515*  --  391  --   --  448*  --  499*  HEPARINUNFRC  --   --   --  <0.10* <0.10*  --   --  <0.10*  CREATININE 1.01  --  1.19  --   --  0.90 0.91 0.90  TROPONINIHS 21* 24*  --   --   --   --   --   --     Estimated Creatinine Clearance: 91.1 mL/min (by C-G formula based on SCr of 0.9 mg/dL).  Medical History: Past Medical History:  Diagnosis Date   Arthritis    COVID    has had Covid 3 times, not sick with it   COVID    has had 3 times mild to no symptoms   Depression    Diabetes mellitus without complication (HCC)    pt states he has now been told that he is a type 1 diabetic.   Dyspnea    albuterol inhaler   HLD (hyperlipidemia)    Hypertension    Peripheral vascular disease (HCC)     Medications:  Medications Prior to Admission  Medication Sig Dispense Refill Last Dose/Taking   acetaminophen (TYLENOL) 500 MG tablet Take 2 tablets (1,000 mg total) by mouth every 6 (six) hours as needed for mild pain or fever (For the first 3 days, may take 2 tabs (1000 mg  total) 4 times daily scheduled following which can take 2 tabs (1000 mg total) 4 times daily as needed.).   Past Week   atorvastatin (LIPITOR) 10 MG tablet Take 10 mg by mouth daily.   Past Week   gabapentin (NEURONTIN) 600 MG tablet Take 1 tablet (600 mg total) by mouth 3 (three) times daily. 90 tablet 3 Past Week   pioglitazone (ACTOS) 15 MG tablet Take 15 mg by mouth daily.   Taking   insulin aspart protamine - aspart (NOVOLOG 70/30 MIX) (70-30) 100 UNIT/ML FlexPen Inject 10 Units into the skin 2 (two) times daily with a meal. (Patient not taking: Reported on 07/29/2023) 15 mL 0 Not Taking   Insulin Pen Needle 32G X 4 MM MISC Use as directed two times daily. 100 each 0    methocarbamol (ROBAXIN) 500 MG tablet Take 1 tablet (500 mg total) by mouth every 6 (six) hours as needed for muscle spasms. (Patient not taking: Reported on 07/29/2023) 30 tablet 0 Not Taking   Multiple Vitamin (MULTIVITAMIN WITH MINERALS) TABS tablet Take 1 tablet by mouth daily. (Patient  not taking: Reported on 07/29/2023)   Not Taking   nicotine (NICODERM CQ - DOSED IN MG/24 HOURS) 14 mg/24hr patch Place 1 patch (14 mg total) onto the skin daily. (Patient not taking: Reported on 07/29/2023) 28 patch 0 Not Taking   oxycodone (OXY-IR) 5 MG capsule Take 1 capsule (5 mg total) by mouth every 6 (six) hours as needed. (Patient not taking: Reported on 07/29/2023) 28 capsule 0 Not Taking   Oxycodone HCl 10 MG TABS Take 0.5 tablets (5 mg total) by mouth every 4 (four) hours as needed. (Patient not taking: Reported on 07/29/2023) 42 tablet 0 Not Taking   Scheduled:   aspirin EC  81 mg Oral Daily   atorvastatin  10 mg Oral QPM   calcitonin  4 Units/kg Subcutaneous BID   dexamethasone (DECADRON) injection  4 mg Intravenous QHS   digoxin  0.25 mg Oral Daily   insulin aspart  0-20 Units Subcutaneous TID PC & HS   insulin glargine-yfgn  10 Units Subcutaneous Daily   metoprolol tartrate  75 mg Oral BID   QUEtiapine  25 mg Oral BID    Infusions:   sodium chloride     ceFEPime (MAXIPIME) IV 2 g (07/31/23 0152)   heparin 1,700 Units/hr (07/31/23 0440)    Assessment: 62 YOM admitted for sepsis and found to have new onset atrial fibrillation and new subsegmental pulmonary embolism. 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 Pharmacy consulted to switch heparin gtt to Eliquis per primary.  Patient has not been therapeutic on heparin gtt since starting on 12/16. CBC stable. Will proceed with starting Eliquis, to start at time of discontinuation of heparin gtt. Avoiding higher 10mg  BID Eliquis dose due to risk of hemorrhagic conversion of brain metastasis (neuro cleared this dose).  Goal of Therapy:  Monitor platelets by anticoagulation protocol: Yes   Plan:  Discontinue heparin drip Start Eliquis 5mg  PO BID Monitor daily CBC and signs/symptoms of bleeding  Nicole Kindred, PharmD PGY1 Pharmacy Resident 07/31/2023 8:54 AM

## 2023-07-31 NOTE — Progress Notes (Signed)
PROGRESS NOTE                                                                                                                                                                                                             Patient Demographics:    Cody Pena, is a 63 y.o. male, DOB - 1961-07-09, WUX:[id number]  Outpatient Primary MD for the patient is [facility]    LOS - 3  Admit date - 07/28/2023    Chief Complaint  Patient presents with   Altered Mental Status   Hyperglycemia   Back Pain       Brief Narrative (HPI from H&P)    62 y.o. male with medical history significant of peripheral vascular disease s/p BKA, hypertension, insulin-dependent type 2 diabetes, and hyperlipidemia who presents with altered mental status and back pain.   Pt not able to provide much hx as he is lethargic after receiving IV morphine. He has been told of cancer in his imaging and appears depressed. Says his memory has been worse. Feels overwhelmed with his new cancer diagnosis. Denies any current pain although earlier had lower back pain. He is alert and oriented only to self although fully able to follow commands. Tells me he does not want his wife to know of his diagnoses yet.   Further workup in the ER is was suggestive of bilateral pyelonephritis with sepsis, new lung mass suspicious for malignancy with mets to spine, lymph nodes and brain, brain mass, small incidental CVA, PE, A-fib RVR and he was admitted.   Subjective:   Patient in bed, appears comfortable, denies any headache, no fever, no chest pain or pressure, no shortness of breath , no abdominal pain. No focal weakness.   Assessment  & Plan :    Sepsis (HCC)  -secondary to bilateral pyelonephritis - No signs of obstruction or hydronephrosis, continue empiric antibiotics and follow cultures, continue IV fluids, sepsis pathophysiology is improving  Paroxysmal atrial  fibrillation with RVR (HCC) -[place] vas 2 score of greater than 3.  - Newly diagnosed atrial fibrillation.  Likely related to his dehydration with hypercalcemia, sepsis and PE.  TSH stable, echo pending, was on Cardizem drip, now switched to oral Lopressor by cardiology along with as needed IV Cardizem and IV Lopressor & IV digoxin ordered.  Monitor rate, transition to oral Eliquis  on 07/31/2023.  Chronic systolic heart failure EF 35% with global hypokinesis on echocardiogram.  Cardiology on board.  For now supportive care, defer further management to cardiology, not a candidate for invasive testing or procedures due to his metastatic cancer.    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  Case discussed with pulmonary, they will see the patient and arrange for biopsy likely bronchoscopy, oncology also consulted to see.  Long-term prognosis does not appear to be good, pulmonary and oncology following, s/p bronchoscopy morning of 07/30/2023 with biopsy, follow results.  Defer further management of malignancy to oncology team.  Brain mass  -MRI confirms brain mass with some vasogenic edema.  For now Decadron and monitor  CVA (cerebral vascular accident) (HCC) - MRI brain imaging resulted demonstrating mixed cystic and solid enhancing mass to the left occiput lobe.  There was also a punctate acute ischemic nonhemorrhagic infarct at the right parietal cortex.  Case discussed with Dr. Pearlean Brownie stroke team on 07/29/2023 in detail, cleared for heparin without bolus for underlying PE and A-fib.  Basic stroke workup however no significant deficit from stroke standpoint this is likely incidental.  Acute pulmonary embolism (HCC)   - CTA noted, currently see discussions about heparin with stroke team above.  Heparin without bolus with caution and monitor.  Back pain  -  MRI of the L-spine shows suspicion for possible metastatic malignancy in the spine.   Supportive care for now.  Hypercalcemia  - getting IV fluids, calcitonin, oncology also ordered Zometa, will check PTH and PTH RP, monitor trend likely will improve in the next 1 to 2 days after Zometa.  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  Patient vascular surgery follow-up.  S/P BKA (below knee amputation), right (HCC)  History of PAD, There is question of possible occluded right superior femoral artery stent on CT abdomen/pelvis but patient denies any claudication or pain, monitor clinically.  Outpatient vascular surgery follow-up.  Hospital acquired metabolic encephalopathy.  Due to being in unfamiliar setting, brain mass, steroid in use, low-dose Seroquel, supportive care and monitor.   T2DM (type 2 diabetes mellitus) (HCC)  - insulin-dependent T2DM , keep on resistant SSI while on steroids for cerebral edema, Semglee added  Lab Results  Component Value Date   HGBA1C 9.9 (H) 07/28/2023   CBG (last 3)  Recent Labs    07/30/23 0822 07/30/23 1232 07/30/23 2238  GLUCAP 204* 174* 161*           Condition - Extremely Guarded  Family Communication  : Called daughter on 07/29/2023 at 9 AM, no response.  Patient does not want wife to be called  Code Status : Full code  Consults  : Pulmonary, oncology, cardiology, neurology  PUD Prophylaxis :     Procedures  :     TTE.  1. Left ventricular ejection fraction, by estimation, is 30 to 35%. The left ventricle has moderately decreased function. The left ventricle demonstrates global hypokinesis. The left ventricular internal cavity size was mildly dilated. Left ventricular diastolic parameters are indeterminate.  2. Right ventricular systolic function is normal. The right ventricular size is normal. There is normal pulmonary artery systolic pressure. The estimated right ventricular systolic pressure is 25.8 mmHg.  3. The  mitral valve is normal in structure. Trivial mitral valve regurgitation. No evidence of mitral stenosis.  4. The  aortic valve is tricuspid. There is mild calcification of the aortic valve. Aortic valve regurgitation is trivial. No aortic stenosis is present.  5. The inferior vena cava is dilated in size with <50% respiratory variability, suggesting right atrial pressure of 15 mmHg.  6. The patient was in atrial fibrillation  EEG - no seizures  CT chest abdomen pelvis.  CHEST: 1. Large heterogenous mass in the left upper lobe/suprahilar region consistent with malignancy. 2. Diffuse bronchial wall thickening in the lingula and left lower lobe. Differential considerations include lymphangitic spread of tumor or airway infection/inflammation. 3. Multiple pulmonary nodules in the right lung consistent with metastases. 4. Mediastinal, bilateral hilar, and left axillary lymphadenopathy consistent with metastases. 5. Small left pleural effusion. ABDOMEN/PELVIS: 1. Bilateral pyelonephritis, left-greater-than-right. 2. The stent in the right superficial femoral artery is not definitively opacified and may be occluded though this is incompletely evaluated on non arterial phase exam. 3. 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 Aortic Atherosclerosis    CTA chest.   1. Small subsegmental embolic burden in the right lower lobe, with no evidence of right heart strain. 2. Large 12.7 x 9 x 14.6 cm left upper lobe mass with bronchovascular encasement, occluding the left upper lobe bronchus and almost completely occluding the left upper lobe pulmonary artery, and occluding the left superior pulmonary vein. 3. Multiple right lung nodules consistent with metastases. 4. Bilateral bronchial thickening but proportionately greater in the left lower lobe where there is peribronchovascular interstitial and airspace opacities which could indicate  changes of bronchopneumonia or lymphangitic carcinomatosis. 5. Small left and minimal right pleural effusions. 6. Multifocal mediastinal, right hilar and left axillary adenopathy. 7. Aortic and coronary artery atherosclerosis. 8. Emphysema. 9. These results will be called to the ordering clinician or representative by the Radiologist Assistant, and communication documented in the PACS or [employer]. Aortic Atherosclerosis  and Emphysema   MR L Spine - 1. Abnormal marrow replacement with heterogeneous enhancement within the L1 vertebral body, concerning for osseous metastatic disease. Associated mild pathologic compression fracture measures up to 20% without bony retropulsion. No extra osseous or epidural extension of tumor. 2. No other evidence for metastatic disease within the lumbar spine. 3. Multilevel lumbar spondylosis with resultant moderate canal stenosis at L2-3. Associated multilevel foraminal narrowing as above, moderate to severe in nature at L4-5 bilaterally. 4. Changes of acute pyelonephritis involving the left greater than right kidneys, described on prior CT.   MRI brain. 1. 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 Mild surrounding vasogenic edema without significant regional mass effect or midline shift. 2. 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 3. Underlying moderately advanced chronic microvascular ischemic disease      Disposition Plan  :    Status is: Inpatient  DVT Prophylaxis  :    SCDs Start: 07/28/23 2115 .Marland Kitchen Heparin    Lab Results  Component Value Date   PLT 499 (H) 07/31/2023    Diet :  Diet Order             DIET SOFT Fluid consistency: Thin  Diet effective now                    Inpatient Medications  Scheduled Meds:  aspirin EC  81 mg Oral Daily   atorvastatin  10 mg Oral QPM   dexamethasone  (DECADRON) injection  4 mg Intravenous QHS   digoxin  0.25 mg Oral Daily   insulin aspart  0-20 Units Subcutaneous TID PC & HS   insulin glargine-yfgn  10 Units Subcutaneous Daily   metoprolol tartrate  75 mg Oral BID   QUEtiapine  25 mg Oral BID   Continuous Infusions:  sodium chloride 100 mL/hr at 07/31/23 0737   ceFEPime (MAXIPIME) IV 2 g (07/31/23 0152)   heparin 1,700 Units/hr (07/31/23 0440)   PRN Meds:.diltiazem, haloperidol lactate, HYDROcodone-acetaminophen, morphine  Antibiotics  :    Anti-infectives (From admission, onward)    Start     Dose/Rate Route Frequency Ordered Stop   07/29/23 0200  ceFEPIme (MAXIPIME) 2 g in sodium chloride 0.9 % 100 mL IVPB        2 g 200 mL/hr over 30 Minutes Intravenous Every 8 hours 07/28/23 2125     07/28/23 1830  vancomycin (VANCOCIN) IVPB 1000 mg/200 mL premix       Placed in "Followed by" Linked Group   1,000 mg 200 mL/hr over 60 Minutes Intravenous  Once 07/28/23 1719 07/28/23 2121   07/28/23 1730  ceFEPIme (MAXIPIME) 2 g in sodium chloride 0.9 % 100 mL IVPB        2 g 200 mL/hr over 30 Minutes Intravenous  Once 07/28/23 1719 07/28/23 1850   07/28/23 1730  vancomycin (VANCOCIN) IVPB 1000 mg/200 mL premix       Placed in "Followed by" Linked Group   1,000 mg 200 mL/hr over 60 Minutes Intravenous  Once 07/28/23 1719 07/28/23 2013         Objective:   Vitals:   07/31/23 0000 07/31/23 0100 07/31/23 0200 07/31/23 0400  BP: 124/71 125/75 131/73 136/72  Pulse: 100 (!) 104 (!) 109 (!) 115  Resp: (!) 21 18 (!) 21 19  Temp:    98 F (36.7 C)  TempSrc:    Oral  SpO2: 100% 100% 100% 93%  Weight:      Height:        Wt Readings from Last 3 Encounters:  07/30/23 77.7 kg  01/06/23 97.5 kg  01/31/22 104.3 kg     Intake/Output Summary (Last 24 hours) at 07/31/2023 0827 Last data filed at 07/31/2023 0737 Gross per 24 hour  Intake 1532.38 ml  Output --  Net 1532.38 ml     Physical Exam  Awake Alert, No new F.N  deficits, Normal affect [place].AT,PERRAL Supple Neck, No JVD,   Symmetrical Chest wall movement, Good air movement bilaterally, CTAB iRRR,No Gallops,Rubs or new Murmurs,  +ve B.Sounds, Abd Soft, No tenderness,   Right BKA    Data Review:    Recent Labs  Lab 07/28/23 1548 07/29/23 0422 07/30/23 0422 07/31/23 0233  WBC 18.6* 18.7* 18.2* 19.6*  HGB 13.0 11.3* 11.1* 12.7*  HCT 40.9 35.7* 35.5* 40.0  PLT 515* 391 448* 499*  MCV 83.1 83.2 84.5 83.7  MCH 26.4 26.3 26.4 26.6  MCHC 31.8 31.7 31.3 31.8  RDW 14.3 14.4 14.5 14.6  LYMPHSABS 1.6  --  0.8 0.6*  MONOABS 1.3*  --  0.6 1.0  EOSABS 0.1  --  0.0 0.0  BASOSABS 0.1  --  0.1 0.1    Recent Labs  Lab 07/28/23 1548 07/28/23 1553 07/28/23 1801 07/28/23 2304 07/29/23 0422 07/29/23 0942 07/30/23 0422 07/30/23 2326 07/31/23 0233  NA 132*  --   --   --  130*  --  131*  136 136  K 4.5  --   --   --  4.5  --  4.3 4.8 4.3  CL 95*  --   --   --  97*  --  101 101 100  CO2 24  --   --   --  24  --  22 21* 23  ANIONGAP 13  --   --   --  9  --  8 14 13   GLUCOSE 228*  --   --   --  189*  --  236* 153* 154*  BUN 32*  --   --   --  30*  --  32* 32* 30*  CREATININE 1.01  --   --   --  1.19  --  0.90 0.91 0.90  AST 21  --   --   --  15  --  19  --  19  ALT 13  --   --   --  11  --  13  --  12  ALKPHOS 95  --   --   --  85  --  82  --  80  BILITOT 0.9  --   --   --  0.6  --  0.6  --  0.6  ALBUMIN 2.2*  --   --   --  2.0*  --  2.0*  --  2.4*  CRP  --   --   --   --   --  18.1* 11.0*  --  8.6*  PROCALCITON  --   --   --   --   --  <0.10 <0.10  --  <0.10  LATICACIDVEN  --  3.7* 2.0* 1.6  --   --   --   --   --   TSH  --   --   --   --  1.533  --   --   --   --   HGBA1C  --   --   --  9.9*  --   --   --   --   --   BNP  --   --   --   --   --  250.4* 269.7* 516.6* 219.9*  MG  --   --   --   --   --  1.9 2.0 1.9 2.0  CALCIUM 12.3*  --   --   --  12.3*  --  11.9* 12.1* 11.7*      Recent Labs  Lab 07/28/23 1548 07/28/23 1553  07/28/23 1801 07/28/23 2304 07/29/23 0422 07/29/23 0942 07/30/23 0422 07/30/23 2326 07/31/23 0233  CRP  --   --   --   --   --  18.1* 11.0*  --  8.6*  PROCALCITON  --   --   --   --   --  <0.10 <0.10  --  <0.10  LATICACIDVEN  --  3.7* 2.0* 1.6  --   --   --   --   --   TSH  --   --   --   --  1.533  --   --   --   --   HGBA1C  --   --   --  9.9*  --   --   --   --   --   BNP  --   --   --   --   --  250.4* 269.7* 516.6* 219.9*  MG  --   --   --   --   --  1.9 2.0 1.9 2.0  CALCIUM 12.3*  --   --   --  12.3*  --  11.9* 12.1* 11.7*    --------------------------------------------------------------------------------------------------------------- Lab Results  Component Value Date   CHOL 131 07/24/2021   HDL 28 (L) 07/24/2021   LDLCALC 48 07/24/2021   TRIG 273 (H) 07/24/2021   CHOLHDL 4.7 07/24/2021    Lab Results  Component Value Date   HGBA1C 9.9 (H) 07/28/2023   Recent Labs    07/29/23 0422  TSH 1.533   No results for input(s): "VITAMINB12", "FOLATE", "FERRITIN", "TIBC", "IRON", "RETICCTPCT" in the last 72 hours. ------------------------------------------------------------------------------------------------------------------ Cardiac Enzymes No results for input(s): "CKMB", "TROPONINI", "MYOGLOBIN" in the last 168 hours.  Invalid input(s): "CK"  Micro Results Recent Results (from the past 240 hours)  Blood culture (routine x 2)     Status: None (Preliminary result)   Collection Time: 07/28/23  3:48 PM   Specimen: BLOOD RIGHT ARM  Result Value Ref Range Status   Specimen Description BLOOD RIGHT ARM  Final   Special Requests   Final    BOTTLES DRAWN AEROBIC AND ANAEROBIC Blood Culture adequate volume   Culture   Final    NO GROWTH 2 DAYS Performed at [facility], [address] [place], [place] [address]    Report Status PENDING  Incomplete  Blood culture (routine x 2)     Status: None (Preliminary result)   Collection Time: 07/28/23  3:48 PM   Specimen:  BLOOD LEFT ARM  Result Value Ref Range Status   Specimen Description BLOOD LEFT ARM  Final   Special Requests   Final    BOTTLES DRAWN AEROBIC AND ANAEROBIC Blood Culture adequate volume   Culture   Final    NO GROWTH 2 DAYS Performed at [facility], [address], [place], [place] [address]    Report Status PENDING  Incomplete    Radiology Reports CT ANGIO HEAD NECK W WO CM Result Date: 07/30/2023 CLINICAL DATA:  Stroke follow-up EXAM: CT ANGIOGRAPHY HEAD AND NECK WITH AND WITHOUT CONTRAST TECHNIQUE: Multidetector CT imaging of the head and neck was performed using the standard protocol during bolus administration of intravenous contrast. Multiplanar CT image reconstructions and MIPs were obtained to evaluate the vascular anatomy. Carotid stenosis measurements (when applicable) are obtained utilizing NASCET criteria, using the distal internal carotid diameter as the denominator. RADIATION DOSE REDUCTION: This exam was performed according to the departmental dose-optimization program which includes automated exposure control, adjustment of the mA and/or kV according to patient size and/or use of iterative reconstruction technique. CONTRAST:  75mL OMNIPAQUE IOHEXOL 350 MG/ML SOLN COMPARISON:  Head CT 07/28/2023 FINDINGS: CT HEAD FINDINGS Brain: Unchanged appearance of hemorrhagic lesion of the left posterior parietal lobe. No midline shift or other mass effect. Vascular: No hyperdense vessel or unexpected vascular calcification. Skull: The visualized skull base, calvarium and extracranial soft tissues are normal. Sinuses/Orbits: No fluid levels or advanced mucosal thickening of the visualized paranasal sinuses. No mastoid or middle ear effusion. Normal orbits. CTA NECK FINDINGS Skeleton: C3-7 ACDF. Other neck: Normal pharynx, larynx and major salivary glands. No cervical lymphadenopathy. Unremarkable thyroid gland. Upper chest: Complete opacification of the left apex. Multiple pulmonary nodules  in the visualized right lung apex, measuring up to 9 mm. Fluid adenopathy in the upper mediastinum. Aortic arch: There is calcific atherosclerosis of the aortic arch. Conventional 3 vessel aortic branching pattern. RIGHT carotid system: No dissection, occlusion or aneurysm. Mild atherosclerotic calcification at the carotid  bifurcation without hemodynamically significant stenosis. LEFT carotid system: No dissection, occlusion or aneurysm. There is calcified atherosclerosis extending into the proximal ICA, resulting in less than 50% stenosis. Vertebral arteries: Codominant configuration. There is no dissection, occlusion or flow-limiting stenosis to the skull base (V1-V3 segments). CTA HEAD FINDINGS POSTERIOR CIRCULATION: Vertebral arteries are normal. No proximal occlusion of the anterior or inferior cerebellar arteries. Basilar artery is normal. Superior cerebellar arteries are normal. Posterior cerebral arteries are normal. ANTERIOR CIRCULATION: Intracranial internal carotid arteries are normal. Anterior cerebral arteries are normal. Middle cerebral arteries are normal. Venous sinuses: As permitted by contrast timing, patent. Anatomic variants: None Review of the MIP images confirms the above findings. IMPRESSION: 1. No emergent large vessel occlusion or hemodynamically significant stenosis of the head or neck. 2. Unchanged appearance of hemorrhagic lesion of the left posterior parietal lobe. 3. Complete opacification of the left thoracic apex and multiple pulmonary nodules in the visualized right lung apex, measuring up to 9 mm. These findings are consistent with known metastatic disease 4. Upper mediastinal lymphadenopathy. Aortic Atherosclerosis (ICD10-I70.0). Electronically Signed   By: Deatra Robinson M.D.   On: 07/30/2023 19:49   VAS [place] LOWER EXTREMITY VENOUS (DVT) Result Date: 07/29/2023  Lower Venous DVT Study Patient Name:  BOEN MALATESTA  Date of Exam:   07/29/2023 Medical Rec #: [id number]         Accession #:    [id number] Date of Birth: May 30, 1961       Patient Gender: M Patient Age:   51 years Exam Location:  [facility] Procedure:      VAS [place] LOWER EXTREMITY VENOUS (DVT) Referring Phys: CHING TU --------------------------------------------------------------------------------  Indications: Pulmonary embolism.  Risk Factors: Confirmed PE Surgery RT BKA. Anticoagulation: Heparin. Comparison Study: No significant changes seen since prior exam 07/24/21 Performing Technologist: Shona Simpson  Examination Guidelines: A complete evaluation includes B-mode imaging, spectral Doppler, color Doppler, and power Doppler as needed of all accessible portions of each vessel. Bilateral testing is considered an integral part of a complete examination. Limited examinations for reoccurring indications may be performed as noted. The reflux portion of the exam is performed with the patient in reverse Trendelenburg.  +---------+---------------+---------+-----------+----------+--------------+ RIGHT    CompressibilityPhasicitySpontaneityPropertiesThrombus Aging +---------+---------------+---------+-----------+----------+--------------+ CFV      Full           Yes      Yes                                 +---------+---------------+---------+-----------+----------+--------------+ SFJ      Full                                                        +---------+---------------+---------+-----------+----------+--------------+ FV Prox  Full                                                        +---------+---------------+---------+-----------+----------+--------------+ FV Mid   Full                                                        +---------+---------------+---------+-----------+----------+--------------+  FV DistalFull                                                        +---------+---------------+---------+-----------+----------+--------------+ PFV      Full                                                         +---------+---------------+---------+-----------+----------+--------------+ POP      Full           Yes      Yes                                 +---------+---------------+---------+-----------+----------+--------------+ PTV      Full                                                        +---------+---------------+---------+-----------+----------+--------------+ PERO     Full                                                        +---------+---------------+---------+-----------+----------+--------------+   +---------+---------------+---------+-----------+----------+--------------+ LEFT     CompressibilityPhasicitySpontaneityPropertiesThrombus Aging +---------+---------------+---------+-----------+----------+--------------+ CFV      Full           Yes      Yes                                 +---------+---------------+---------+-----------+----------+--------------+ SFJ      Full                                                        +---------+---------------+---------+-----------+----------+--------------+ FV Prox  Full                                                        +---------+---------------+---------+-----------+----------+--------------+ FV Mid   Full                                                        +---------+---------------+---------+-----------+----------+--------------+ FV DistalFull                                                        +---------+---------------+---------+-----------+----------+--------------+  PFV      Full                                                        +---------+---------------+---------+-----------+----------+--------------+ POP      Full           Yes      Yes                                 +---------+---------------+---------+-----------+----------+--------------+ PTV      Full                                                         +---------+---------------+---------+-----------+----------+--------------+ PERO     Full                                                        +---------+---------------+---------+-----------+----------+--------------+     Summary: BILATERAL: - No evidence of deep vein thrombosis seen in the lower extremities, bilaterally. -No evidence of popliteal cyst, bilaterally.   *See table(s) above for measurements and observations. Electronically signed by Gerarda Fraction on 07/29/2023 at 6:50:19 PM.    Final    ECHOCARDIOGRAM COMPLETE Result Date: 07/29/2023    ECHOCARDIOGRAM REPORT   Patient Name:   EDWARD RUCKI Date of Exam: 07/29/2023 Medical Rec #:  [id number]       Height:       75.0 in Accession #:    [id number]      Weight:       171.3 lb Date of Birth:  1960-12-20      BSA:          2.055 m Patient Age:    62 years        BP:           140/117 mmHg Patient Gender: M               HR:           122 bpm. Exam Location:  Inpatient Procedure: 2D Echo, Cardiac Doppler and Color Doppler Indications:    Atrial Fibrillation I48.91  History:        Patient has no prior history of Echocardiogram examinations.                 Arrythmias:Atrial Fibrillation, Signs/Symptoms:Dyspnea; Risk                 Factors:Hypertension, Diabetes, Current Smoker and Dyslipidemia.  Sonographer:    Lucendia Herrlich RCS Referring Phys: [id number] CHING T TU  Sonographer Comments: Image acquisition challenging due to uncooperative patient. IMPRESSIONS  1. Left ventricular ejection fraction, by estimation, is 30 to 35%. The left ventricle has moderately decreased function. The left ventricle demonstrates global hypokinesis. The left ventricular internal cavity size was mildly dilated. Left ventricular diastolic parameters are indeterminate.  2. Right ventricular systolic function is normal. The right ventricular size is normal. There is normal pulmonary  artery systolic pressure. The estimated right ventricular systolic pressure is 25.8  mmHg.  3. The mitral valve is normal in structure. Trivial mitral valve regurgitation. No evidence of mitral stenosis.  4. The aortic valve is tricuspid. There is mild calcification of the aortic valve. Aortic valve regurgitation is trivial. No aortic stenosis is present.  5. The inferior vena cava is dilated in size with <50% respiratory variability, suggesting right atrial pressure of 15 mmHg.  6. The patient was in atrial fibrillation. FINDINGS  Left Ventricle: Left ventricular ejection fraction, by estimation, is 30 to 35%. The left ventricle has moderately decreased function. The left ventricle demonstrates global hypokinesis. The left ventricular internal cavity size was mildly dilated. There is no left ventricular hypertrophy. Left ventricular diastolic parameters are indeterminate. Right Ventricle: The right ventricular size is normal. No increase in right ventricular wall thickness. Right ventricular systolic function is normal. There is normal pulmonary artery systolic pressure. The tricuspid regurgitant velocity is 1.64 m/s, and  with an assumed right atrial pressure of 15 mmHg, the estimated right ventricular systolic pressure is 25.8 mmHg. Left Atrium: Left atrial size was normal in size. Right Atrium: Right atrial size was normal in size. Pericardium: There is no evidence of pericardial effusion. Mitral Valve: The mitral valve is normal in structure. There is mild thickening of the mitral valve leaflet(s). Trivial mitral valve regurgitation. No evidence of mitral valve stenosis. Tricuspid Valve: The tricuspid valve is normal in structure. Tricuspid valve regurgitation is trivial. Aortic Valve: The aortic valve is tricuspid. There is mild calcification of the aortic valve. Aortic valve regurgitation is trivial. No aortic stenosis is present. Aortic valve peak gradient measures 9.7 mmHg. Pulmonic Valve: The pulmonic valve was normal in structure. Pulmonic valve regurgitation is not visualized. Aorta: The  aortic root is normal in size and structure. Venous: The inferior vena cava is dilated in size with less than 50% respiratory variability, suggesting right atrial pressure of 15 mmHg. IAS/Shunts: No atrial level shunt detected by color flow Doppler.  LEFT VENTRICLE PLAX 2D LVIDd:         5.50 cm   Diastology LVIDs:         4.60 cm   LV e' medial:    9.64 cm/s LV PW:         1.00 cm   LV E/e' medial:  10.8 LV IVS:        0.90 cm   LV e' lateral:   14.10 cm/s LVOT diam:     2.10 cm   LV E/e' lateral: 7.4 LV SV:         62 LV SV Index:   30 LVOT Area:     3.46 cm  RIGHT VENTRICLE             IVC RV S prime:     15.50 cm/s  IVC diam: 2.40 cm TAPSE (M-mode): 2.1 cm LEFT ATRIUM         Index LA diam:    3.90 cm 1.90 cm/m  AORTIC VALVE AV Area (Vmax): 2.73 cm AV Vmax:        155.50 cm/s AV Peak Grad:   9.7 mmHg LVOT Vmax:      122.67 cm/s LVOT Vmean:     80.267 cm/s LVOT VTI:       0.180 m  AORTA Ao Root diam: 3.70 cm Ao Asc diam:  3.60 cm MITRAL VALVE  TRICUSPID VALVE MV Area (PHT): 5.84 cm     TR Peak grad:   10.8 mmHg MV Decel Time: 130 msec     TR Vmax:        164.00 cm/s MV E velocity: 104.00 cm/s MV A velocity: 70.50 cm/s   SHUNTS MV E/A ratio:  1.48         Systemic VTI:  0.18 m                             Systemic Diam: 2.10 cm Dalton McleanMD Electronically signed by Wilfred Lacy Signature Date/Time: 07/29/2023/5:47:50 PM    Final    EEG adult Result Date: 07/29/2023 Charlsie Quest, MD     07/29/2023 11:52 AM Patient Name: CLOID VOSSLER MRN: [id number] Epilepsy Attending: Charlsie Quest Referring Physician/Provider: Caryl Pina, MD Date: 07/29/2023 Duration: 25.56 mins Patient history: 62 yo M presented to the ED yesterday afternoon with a one week history of AMS and back pain. EEG to evaluate for seizure Level of alertness: Awake AEDs during EEG study: None Technical aspects: This EEG study was done with scalp electrodes positioned according to the 10-20 International system of  electrode placement. Electrical activity was reviewed with band pass filter of 1-70Hz , sensitivity of 7 uV/mm, display speed of 38mm/sec with a 60Hz  notched filter applied as appropriate. EEG data were recorded continuously and digitally stored.  Video monitoring was available and reviewed as appropriate. Description: No posterior dominant rhythm was seen. EEG showed continuous generalized 3 to 6 Hz theta-delta slowing admixed with 12-14 Hz beta activity distributed symmetrically and diffusely.  Physiologic photic driving was not seen during photic stimulation.  Hyperventilation was not performed.   ABNORMALITY - Continuous slow, generalized IMPRESSION: This study is suggestive of moderate diffuse encephalopathy. No seizures or epileptiform discharges were seen throughout the recording. Charlsie Quest    CT Angio Chest Pulmonary Embolism (PE) W or WO Contrast Result Date: 07/29/2023 CLINICAL DATA:  Pulmonary embolism suspected. Positive D-dimer. CT yesterday demonstrating a large left upper lobe neoplasm with mediastinal and hilar adenopathy and bronchovascular encasement. EXAM: CT ANGIOGRAPHY CHEST WITH CONTRAST TECHNIQUE: Multidetector CT imaging of the chest was performed using the standard protocol during bolus administration of intravenous contrast. Multiplanar CT image reconstructions and MIPs were obtained to evaluate the vascular anatomy. RADIATION DOSE REDUCTION: This exam was performed according to the departmental dose-optimization program which includes automated exposure control, adjustment of the mA and/or kV according to patient size and/or use of iterative reconstruction technique. CONTRAST:  75mL OMNIPAQUE IOHEXOL 350 MG/ML SOLN COMPARISON:  CT chest, abdomen and pelvis with IV contrast yesterday at 5:28 p.m. (current exam 07/29/2023 at 12:08 a.m.). FINDINGS: Cardiovascular: There is a small embolic burden in the right lower lobe with nonocclusive thrombus within a posterior basal subsegmental  branch likely extending into 2 of its downstream small branches. There is no evidence of right heart strain and no further embolus is seen. Bronchovascular tumor encasement narrows the distal left main pulmonary artery and it appears to almost completely occlude the left upper lobe pulmonary artery, and occludes the left superior pulmonary vein. Pulmonary trunk is upper limits of normal in caliber. Cardiac size is normal. There is a small pericardial effusion. There is no venous dilatation. Aortic and three-vessel coronary artery atherosclerosis is present but no aneurysm, stenosis or dissection, with scattered plaque in the great vessels. Mediastinum/Nodes: Large left upper lobe mass encases and obscures the hilar  structures for the most part. There is undoubtedly left hilar adenopathy but it is difficult to separate from the mass. Multifocal mediastinal adenopathy is seen with subcarinal nodal mass 4.3 x 2.6 cm on 5:74, right paratracheal adenopathy with multiple lymph nodes enlarged for example measuring 2 x 1.9 cm on 5:48, enlarged right hilar nodes measuring as much as 2.4 x 3.1 cm on 5:71, and left axillary adenopathy with the largest of these lymph nodes measuring 2.3 x 2.0 cm on 5:53. The trachea and main bronchi are patent. Unremarkable thoracic esophagus. Lungs/Pleura: Large heterogeneous left upper lobe mass obstructs the left upper lobe bronchus, with no visible remaining aeration in the left upper lobe. Scattered foci of air in the apical portion of the mass could indicate tumoral necrosis or residual air bronchograms versus infectious complication. In total the mass measures 12.7 cm AP on 5:53, 9 cm transverse on 5:44, and 14.6 cm in height. Small layering left and minimal layering right pleural effusions are also again noted as well as multiple right lung nodules consistent with metastases. There is bilateral bronchial thickening but it is proportionately greater in the left lower lobe where there is  peribronchovascular interstitial and airspace opacities which could indicate changes of bronchopneumonia or lymphangitic carcinomatosis. Multiple micronodules are noted in this distribution. Examples of some of the larger right lung metastases include a 1.1 cm nodule in the right upper lobe on 6:46, a right lower lobe superior segment nodule measuring 1.4 cm on 6:51, and a 1.3 cm right lower lobe nodule on 6:119. Others are smaller. There are mild centrilobular and paraseptal emphysematous changes in the right upper lobe. Upper Abdomen: No upper abdominal metastasis is seen. Musculoskeletal: Thoracic spondylosis. Partially visible lower cervical ACDF plating. Multilevel thoracic degenerative disc disease. No destructive bone lesions. Review of the MIP images confirms the above findings. IMPRESSION: 1. Small subsegmental embolic burden in the right lower lobe, with no evidence of right heart strain. 2. Large 12.7 x 9 x 14.6 cm left upper lobe mass with bronchovascular encasement, occluding the left upper lobe bronchus and almost completely occluding the left upper lobe pulmonary artery, and occluding the left superior pulmonary vein. 3. Multiple right lung nodules consistent with metastases. 4. Bilateral bronchial thickening but proportionately greater in the left lower lobe where there is peribronchovascular interstitial and airspace opacities which could indicate changes of bronchopneumonia or lymphangitic carcinomatosis. 5. Small left and minimal right pleural effusions. 6. Multifocal mediastinal, right hilar and left axillary adenopathy. 7. Aortic and coronary artery atherosclerosis. 8. Emphysema. 9. These results will be called to the ordering clinician or representative by the Radiologist Assistant, and communication documented in the PACS or [employer]. Aortic Atherosclerosis (ICD10-I70.0) and Emphysema (ICD10-J43.9). Electronically Signed   By: Almira Bar M.D.   On: 07/29/2023 00:42   MR Lumbar  Spine W Wo Contrast Result Date: 07/28/2023 CLINICAL DATA:  Initial evaluation for acute low back pain EXAM: MRI LUMBAR SPINE WITHOUT AND WITH CONTRAST TECHNIQUE: Multiplanar and multiecho pulse sequences of the lumbar spine were obtained without and with intravenous contrast. CONTRAST:  10mL GADAVIST GADOBUTROL 1 MMOL/ML IV SOLN COMPARISON:  Prior CT from earlier the same day. FINDINGS: Segmentation: Standard. Lowest well-formed disc space labeled the L5-S1 level. Alignment: 2 mm degenerative retrolisthesis of L1 on L2 and L2 on L3, with 3 mm anterolisthesis of L5 on S1. Vertebrae: Abnormal marrow replacement with edema and heterogeneous enhancement seen within the L1 vertebral body, concerning for osseous metastatic disease. Associated mild pathologic compression  fracture measures up to 20% without bony retropulsion. No extra osseous or epidural extension of tumor. No other visible metastatic lesions within the lumbar spine. Bone marrow signal intensity otherwise normal. Prominent degenerative reactive endplate changes noted about the L2-3 interspace. No other abnormal marrow edema or enhancement. Conus medullaris and cauda equina: Conus extends to the T12-L1 no abnormal enhancement. Level. Conus and cauda equina appear normal. Paraspinal and other soft tissues: Changes of acute pyelonephritis involving the left greater than right kidneys noted, described on prior CT. 5.3 cm simple right renal cyst, benign in appearance, no follow-up imaging recommended. Disc levels: T12-L1: Mild diffuse disc bulge with reactive endplate spurring. Mild facet hypertrophy. No spinal stenosis. Foramina remain patent. L1-2: Degenerative intervertebral disc space narrowing with diffuse disc bulge and disc desiccation. Reactive endplate spurring. Mild bilateral facet hypertrophy. Resultant mild narrowing of the lateral recesses bilaterally. Central canal remains patent. Foramina remain adequately patent bilaterally. L2-3: Advanced  degenerative intervertebral disc space narrowing with diffuse disc bulge and disc desiccation. Prominent reactive endplate change with marginal endplate osteophytic spurring and marrow edema. Mild to moderate bilateral facet arthrosis. Resultant moderate canal with bilateral subarticular stenosis. Moderate left worse than right L2 foraminal narrowing. L3-4: Diffuse disc bulge with reactive endplate spurring. Superimposed small left foraminal disc protrusion contacts the exiting left L3 nerve root (series 9, image 12). Mild-to-moderate bilateral facet hypertrophy. Resultant mild narrowing of the lateral recesses bilaterally. Central canal remains patent. Mild to moderate left L3 foraminal stenosis. Right neural foramen remains patent. L4-5: Disc desiccation with diffuse disc bulge. Reactive endplate spurring. Moderate to severe bilateral facet arthrosis with associated small reactive joint effusions. Resultant mild to moderate bilateral subarticular stenosis. Central canal remains patent. Moderate to severe bilateral L4 foraminal narrowing. L5-S1: Anterolisthesis with degenerative intervertebral disc space narrowing. Diffuse disc bulge with reactive endplate spurring. Moderate bilateral facet hypertrophy. No significant spinal stenosis. Moderate bilateral L5 foraminal narrowing. IMPRESSION: 1. Abnormal marrow replacement with heterogeneous enhancement within the L1 vertebral body, concerning for osseous metastatic disease. Associated mild pathologic compression fracture measures up to 20% without bony retropulsion. No extra osseous or epidural extension of tumor. 2. No other evidence for metastatic disease within the lumbar spine. 3. Multilevel lumbar spondylosis with resultant moderate canal stenosis at L2-3. Associated multilevel foraminal narrowing as above, moderate to severe in nature at L4-5 bilaterally. 4. Changes of acute pyelonephritis involving the left greater than right kidneys, described on prior CT.  Electronically Signed   By: Rise Mu M.D.   On: 07/28/2023 23:59   MR BRAIN W WO CONTRAST Result Date: 07/28/2023 CLINICAL DATA:  Initial evaluation for brain/CNS neoplasm EXAM: MRI HEAD WITHOUT AND WITH CONTRAST TECHNIQUE: Multiplanar, multiecho pulse sequences of the brain and surrounding structures were obtained without and with intravenous contrast. CONTRAST:  10mL GADAVIST GADOBUTROL 1 MMOL/ML IV SOLN COMPARISON:  Prior CT from 07/28/2023. FINDINGS: Brain: Cerebral volume within normal limits. Patchy T2/FLAIR hyperintensity involving the periventricular and deep white matter both supra hemispheres, consistent with chronic small vessel ischemic disease, moderately advanced in nature. Punctate acute ischemic nonhemorrhagic infarct present at the right parietal cortex (series 9, image 98). No other evidence for acute or subacute ischemia. Mixed cystic and solid peripherally enhancing mass positioned at the left occipital lobe is seen, measuring 4.0 x 3.5 x 3.7 cm. Mild surrounding vasogenic edema without significant regional mass effect or midline shift. 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 No other visible mass lesions  or abnormal enhancement. No hydrocephalus or extra-axial fluid collection. Pituitary gland suprasellar region within normal limits. Vascular: Major intracranial vascular flow voids are maintained. Skull and upper cervical spine: Craniocervical junction within normal limits. Visualized bone marrow signal intensity within normal limits. Postoperative changes noted within the partially visualized upper cervical spine. Sinuses/Orbits: Globes and orbital soft tissues within normal limits. Paranasal sinuses are largely clear. No significant mastoid effusion. Other: None. IMPRESSION: 1. 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 Mild surrounding vasogenic edema without significant regional mass effect or midline shift. 2. 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 3. Underlying moderately advanced chronic microvascular ischemic disease. Electronically Signed   By: Rise Mu M.D.   On: 07/28/2023 23:48   CT L-SPINE NO CHARGE Result Date: 07/28/2023 CLINICAL DATA:  Back pain after fall December 1st EXAM: CT LUMBAR SPINE WITHOUT CONTRAST TECHNIQUE: Multidetector CT imaging of the lumbar spine was performed without intravenous contrast administration. Multiplanar CT image reconstructions were also generated. RADIATION DOSE REDUCTION: This exam was performed according to the departmental dose-optimization program which includes automated exposure control, adjustment of the mA and/or kV according to patient size and/or use of iterative reconstruction technique. COMPARISON:  CT 04/05/2016 FINDINGS: Segmentation: 5 lumbar type vertebrae. Alignment: No evidence of traumatic listhesis. Mild grade 1 retrolisthesis of L2 is likely chronic. Vertebrae: Mild superior endplate compression of L1, may be chronic though is age indeterminate without recent comparison. Otherwise no evidence of acute fracture. There is mild heterogeneity of the marrow of L1 and metastasis is not excluded. Consider MRI for further evaluation. Paraspinal and other soft tissues: See separate report for findings in the abdomen and pelvis. Disc levels: Multilevel spondylosis and disc space height loss with degenerative endplate changes greatest at L2-L3 where it is advanced. Mild multilevel facet arthropathy. Spinal canal narrowing is greatest at L2-L3 where it is moderate. IMPRESSION: 1. Mild superior endplate compression of L1 may be chronic though is age indeterminate without recent comparison. 2. Heterogeneity of the marrow of L1. This may be related to chronic changes however metastasis is not excluded.  Consider MRI for further evaluation. \ 3. Advanced disc space height loss at L2-L3. 4. See separate report for findings in the chest, abdomen, and pelvis. Electronically Signed   By: Minerva Fester M.D.   On: 07/28/2023 18:50   CT CHEST ABDOMEN PELVIS W CONTRAST Result Date: 07/28/2023 CLINICAL DATA:  Altered mental status for 1 week and back pain after a fall on December 1st. EXAM: CT CHEST, ABDOMEN, AND PELVIS WITH CONTRAST TECHNIQUE: Multidetector CT imaging of the chest, abdomen and pelvis was performed following the standard protocol during bolus administration of intravenous contrast. RADIATION DOSE REDUCTION: This exam was performed according to the departmental dose-optimization program which includes automated exposure control, adjustment of the mA and/or kV according to patient size and/or use of iterative reconstruction technique. CONTRAST:  75mL OMNIPAQUE IOHEXOL 350 MG/ML SOLN COMPARISON:  CT abdomen and pelvis 01/14/2008 and chest radiographs 12/07/2014 FINDINGS: CT CHEST FINDINGS Cardiovascular: Normal heart size. Small pericardial effusion. Pericardial thickening where it abuts the large left suprahilar mass. Coronary artery and aortic atherosclerotic calcification. The left upper lobe pulmonary arteries are not well visualized and may be compressed or occluded by the mass. Mediastinum/Nodes: Large heterogenous mass in the left suprahilar region extending into the left upper lobe. Mediastinal, bilateral hilar, and left axillary lymphadenopathy. For  example a subcarinal node measures 2.5 cm (series 3/image 27); 1.3 cm right hilar node on series 3/image 34; and a left axillary node measures 2.2 cm (3/19). Trachea and esophagus are unremarkable. Lungs/Pleura: Large heterogenous mass left upper lobe/suprahilar mass consistent with malignancy measuring 12.1 x 8.7 by 12.1 cm. There are a few small locules of gas in the superior portion of the mass which may represent cavitation. The left upper lobe  bronchus is occluded by the mass. Diffuse bronchial wall thickening in the lingula and left lower lobe with centrilobular micro nodules and tree-in-bud opacities. 1.6 cm subpleural nodule in the anterior lingula (series 4/image 90). Small left pleural effusion. Multiple pulmonary nodules in the right lung consistent with metastases. For example 12 mm nodule in the posterior right lower lobe (4/128) and 9 mm nodule in the right upper lobe (4/44). Mild emphysema in the right. No right pleural effusion. No pneumothorax. Musculoskeletal: No acute fracture.  No destructive osseous lesion. CT ABDOMEN PELVIS FINDINGS Hepatobiliary: No focal hepatic lesion. Layering sludge in the gallbladder. No biliary dilation or evidence of cholecystitis. Pancreas: Unremarkable. Spleen: Unremarkable. Adrenals/Urinary Tract: Normal adrenal glands. Heterogenous attenuation of the left kidney. Focal geographic hypoattenuation in the posterior right kidney. Mild asymmetric left-greater-than-right perinephric stranding. Findings are compatible with pyelonephritis. No urinary calculi or hydronephrosis. Unremarkable bladder. Stomach/Bowel: Normal caliber large and small bowel without bowel wall thickening. Stomach and appendix are within normal limits. Colonic diverticulosis without diverticulitis. Vascular/Lymphatic: Aortic atherosclerosis. 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 The stent in the right superficial femoral artery is not definitively opacified and may be occluded. No enlarged abdominal or pelvic lymph nodes. Reproductive: No acute abnormality. Other: No free intraperitoneal fluid or air. Musculoskeletal: No acute fracture or destructive osseous lesion in the pelvis. See separate report for findings in the lumbar spine. IMPRESSION: CHEST: 1. Large heterogenous mass in the left upper lobe/suprahilar region consistent with malignancy. 2.  Diffuse bronchial wall thickening in the lingula and left lower lobe. Differential considerations include lymphangitic spread of tumor or airway infection/inflammation. 3. Multiple pulmonary nodules in the right lung consistent with metastases. 4. Mediastinal, bilateral hilar, and left axillary lymphadenopathy consistent with metastases. 5. Small left pleural effusion. ABDOMEN/PELVIS: 1. Bilateral pyelonephritis, left-greater-than-right. 2. The stent in the right superficial femoral artery is not definitively opacified and may be occluded though this is incompletely evaluated on non arterial phase exam. 3. 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 Aortic Atherosclerosis (ICD10-I70.0) and Emphysema (ICD10-J43.9). Electronically Signed   By: Minerva Fester M.D.   On: 07/28/2023 18:16   CT Head Wo Contrast Result Date: 07/28/2023 CLINICAL DATA:  Delirium EXAM: CT HEAD WITHOUT CONTRAST TECHNIQUE: Contiguous axial images were obtained from the base of the skull through the vertex without intravenous contrast. RADIATION DOSE REDUCTION: This exam was performed according to the departmental dose-optimization program which includes automated exposure control, adjustment of the mA and/or kV according to patient size and/or use of iterative reconstruction technique. COMPARISON:  CT of the head December 07, 2014. FINDINGS: Brain: Complex cystic and solid 3.4 cm mass in the left parietal lobe. This lesion has some mild hyperdensity associated with it which could represent mineralization and or hemorrhage. Surrounding edema and mild regional mass effect without midline shift. No evidence of acute large vascular territory infarct, mass occupying acute hemorrhage outside the above  lesion, midline shift or hydrocephalus. Vascular: No hyperdense vessel identified. Skull: Normal. Negative for fracture or focal lesion. Sinuses/Orbits: Mostly  clear sinuses.  No acute orbital findings. IMPRESSION: 1. Cystic and solid 3.4 cm mass in the left parietal lobe, which is concerning for a metastasis given the findings on the forthcoming CT of the chest/abdomen/pelvis. Recommend MRI of the head with contrast to further characterize and to assess for other lesions. 2. Surrounding edema and mild regional mass effect without midline shift. Electronically Signed   By: Feliberto Harts M.D.   On: 07/28/2023 18:02      Signature  -   Susa Raring M.D on 07/31/2023 at 8:27 AM   -  To page go to [email or web address]

## 2023-07-31 NOTE — Progress Notes (Signed)
NAME:  Cody Pena, MRN:  [id number], DOB:  16-Feb-1961, LOS: 3 ADMISSION DATE:  07/28/2023, CONSULTATION DATE:  12/16 REFERRING MD:  Thedore Mins, CHIEF COMPLAINT:  Lung mass, PE, Mets to brain   History of Present Illness:   62 y.o. 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 CBG was 352. Patient was found by EMS to be in new onset A-fib with rates as high as 170. Received 20 mg of Cardizem and 750 of normal saline prior to arrival. He arrived in rapid A-fib. He has no known history of a-fib.   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  Left upper lobe bronchus is occluded by the mass. Multiple pulmonary nodules in the right lung consistent with metastasis. Mediastinal, bilateral hilar, left axillary lymphadenopathy consistent with metastasis .There is also a small left and minimal right pleural effusion.   CT abdomen and pelvis demonstrated bilateral pyelonephritis, left greater than right.  Stent in the right superior femoral artery may be occluded.  CTA demonstrated small subsegmental embolic burden in the right lower lobe with no evidence of right heart strain. Most likely  secondary to suspected malignancy. Pt. Also has new onset A fib with CHA2D2VASc of 3 that typically would need anticoagulation as well. However due to brain mass ,the risk of hemorrhage from brain mass likely is higher and outweighs  the benefit of treating a single subsegmental PE. Plan is to consider IVC filter after resolution of  infection (Pyelonephritis) .    In the ED, patient was noted to be afebrile but in atrial fibrillation with RVR with heart rates up to 170s, BP of 152/89 , CBC with leukocytosis of 18K, thrombocytosis of 515, no anemia.  Lactate was elevated 3.7.  CMP with sodium 132, CBG of 228, normal creatinine of 1, hypercalcemia of 12.3. Troponin was elevated but flat at 21-24.  Pt was started on  IV diltiazem infusion, and administered IV vancomycin and cefepime.   PCCM have been asked to assist for consideration of bronchoscopy for tissue sampling in setting of lung mass in a smoker , and PE.  Pertinent  Medical History   Past Medical History:  Diagnosis Date   Arthritis    COVID    has had Covid 3 times, not sick with it   COVID    has had 3 times mild to no symptoms   Depression    Diabetes mellitus without complication (HCC)    pt states he has now been told that he is a type 1 diabetic.   Dyspnea    albuterol inhaler   HLD (hyperlipidemia)    Hypertension    Peripheral vascular disease (HCC)      Significant Hospital Events: Including procedures, antibiotic start and stop dates in addition to other pertinent events   07/28/2023 Admission after fall 4-6 week history of dizziness and worsening shortness of breath 12/17  bronchoscopy for LUL mass  Interim History / Subjective:  Patient somewhat confused w/ b/l soft wrist restraints in place On [place] 4L No acute distress  Objective   Blood pressure (!) 146/80, pulse (!) 104, temperature 98.3 F (36.8 C), temperature source Oral, resp. rate 20, height 6\' 3"  (1.905 m), weight 77.7 kg, SpO2 93%.        Intake/Output Summary (Last 24 hours) at 07/31/2023 1112 Last data filed at 07/31/2023 0737 Gross per 24 hour  Intake 1082.38 ml  Output --  Net 1082.38 ml   Filed Weights   07/28/23 1518 07/29/23 0200 07/30/23 0925  Weight: 97.1  kg 77.7 kg 77.7 kg    Examination: General:  NAD HEENT: MM pink/moist; [place] in place Neuro: somewhat confused but AO; MAE CV: s1s2, RRR, no m/r/g PULM:  dim clear BS bilaterally; [place] GI: soft, bsx4 active  Extremities: warm/dry, no edema  Skin: no rashes or lesions   Resolved Hospital Problem list     Assessment & Plan:  Large  left upper lobe mass with broncho vascular encasement, occluding the left upper lobe bronchus and almost completely occluding the left upper lobe pulmonary artery, and occluding the left superior pulmonary vein. 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 Tonia Brooms outpt  Small sub segmental PE in the right lower lobe, No evidence of heart strain Risk of hemorrhage from brain mass , so unable to anticoagulate . Plan -cont heparin per primary  Best Practice (right click and "Reselect all SmartList Selections" daily)   Best practice per Primary team.  Labs   CBC: Recent Labs  Lab 07/28/23 1548 07/29/23 0422 07/30/23 0422 07/31/23 0233  WBC 18.6* 18.7* 18.2* 19.6*  NEUTROABS 15.4*  --  16.6* 17.8*  HGB 13.0 11.3* 11.1* 12.7*  HCT 40.9 35.7* 35.5* 40.0  MCV 83.1 83.2 84.5 83.7  PLT 515* 391 448* 499*    Basic Metabolic Panel: Recent Labs  Lab 07/28/23 1548 07/29/23 0422 07/29/23 0942 07/30/23 0422 07/30/23 2326 07/31/23 0233  NA 132* 130*  --  131* 136 136  K 4.5 4.5  --  4.3 4.8 4.3  CL 95* 97*  --  101 101 100  CO2 24 24  --  22 21* 23  GLUCOSE 228* 189*  --  236* 153* 154*  BUN 32* 30*  --  32* 32* 30*  CREATININE 1.01 1.19  --  0.90 0.91 0.90  CALCIUM 12.3* 12.3*  --  11.9* 12.1* 11.7*  MG  --   --  1.9 2.0 1.9 2.0  PHOS  --   --   --  3.7  --  2.3*   GFR: Estimated Creatinine Clearance: 91.1 mL/min (by C-G formula based on SCr of  0.9 mg/dL). Recent Labs  Lab 07/28/23 1548 07/28/23 1553 07/28/23 1801 07/28/23 2304 07/29/23 0422 07/29/23 0942 07/30/23 0422 07/31/23 0233  PROCALCITON  --   --   --   --   --  <0.10 <0.10 <0.10  WBC 18.6*  --   --   --  18.7*  --  18.2* 19.6*  LATICACIDVEN  --  3.7* 2.0* 1.6  --   --   --   --     Liver Function Tests: Recent Labs  Lab 07/28/23 1548 07/29/23 0422 07/30/23 0422 07/31/23 0233  AST 21  15 19 19   ALT 13 11 13 12   ALKPHOS 95 85 82 80  BILITOT 0.9 0.6 0.6 0.6  PROT 7.3 6.5 6.0* 6.4*  ALBUMIN 2.2* 2.0* 2.0* 2.4*   No results for input(s): "LIPASE", "AMYLASE" in the last 168 hours. No results for input(s): "AMMONIA" in the last 168 hours.  ABG    Component Value Date/Time   PHART 7.421 12/07/2014 0350   PCO2ART 42.4 12/07/2014 0350   PO2ART 55.0 (L) 12/07/2014 0350   HCO3 27.5 (H) 12/07/2014 0350   TCO2 35 (H) 08/11/2021 0549   O2SAT 89.0 12/07/2014 0350     Coagulation Profile: No results for input(s): "INR", "PROTIME" in the last 168 hours.  Cardiac Enzymes: No results for input(s): "CKTOTAL", "CKMB", "CKMBINDEX", "TROPONINI" in the last 168 hours.  HbA1C: Hgb A1c MFr Bld  Date/Time Value Ref Range Status  07/28/2023 11:04 PM 9.9 (H) 4.8 - 5.6 % Final    Comment:    (NOTE) Pre diabetes:          5.7%-6.4%  Diabetes:              >6.4%  Glycemic control for   <7.0% adults with diabetes   01/06/2023 04:53 AM 10.8 (H) 4.8 - 5.6 % Final    Comment:    (NOTE)         Prediabetes: 5.7 - 6.4         Diabetes: >6.4         Glycemic control for adults with diabetes: <7.0     CBG: Recent Labs  Lab 07/29/23 2116 07/30/23 0822 07/30/23 1232 07/30/23 2238 07/31/23 0919  GLUCAP 171* 204* 174* 161* 238*    Review of Systems:   Dizziness, Shortness of breath, weakness   Past Medical History:  He,  has a past medical history of Arthritis, COVID, COVID, Depression, Diabetes mellitus without complication (HCC), Dyspnea, HLD  (hyperlipidemia), Hypertension, and Peripheral vascular disease (HCC).   Surgical History:   Past Surgical History:  Procedure Laterality Date   ABDOMINAL AORTOGRAM W/LOWER EXTREMITY N/A 08/11/2021   Procedure: ABDOMINAL AORTOGRAM W/LOWER EXTREMITY;  Surgeon: Leonie Douglas, MD;  Location: [facility];  Service: Cardiovascular;  Laterality: N/A;   AMPUTATION Left 11/15/2017   Procedure: LEFT FOOT 2ND RAY AMPUTATION;  Surgeon: Nadara Mustard, MD;  Location: [facility] OR;  Service: Orthopedics;  Laterality: Left;   AMPUTATION Right 08/25/2021   Procedure: RIGHT TRANSMETATARSAL AMPUTATION;  Surgeon: Nadara Mustard, MD;  Location[facility] OR;  Service: Orthopedics;  Laterality: Right;   AMPUTATION Right 09/27/2021   Procedure: BELOW KNEE AMPUTATION;  Surgeon: Nadara Mustard, MD;  Location: [facility] OR;  Service: Orthopedics;  Laterality: Right;   APPLICATION OF WOUND VAC  12/27/2021   Procedure: APPLICATION OF WOUND VAC;  Surgeon: Nadara Mustard, MD;  Location: [facility] OR;  Service: Orthopedics;;   CERVICAL FUSION     I & D EXTREMITY Left 12/27/2021   Procedure: LEFT LEG DEBRIDEMENT ULCERS;  Surgeon: Nadara Mustard, MD;  Location: [facility] OR;  Service: Orthopedics;  Laterality: Left;   I & D EXTREMITY Left 01/31/2022   Procedure: LEFT KNEE DEBRIDEMENT AND APPLY TISSUE GRAFT;  Surgeon: Nadara Mustard, MD;  Location: [facility] OR;  Service: Orthopedics;  Laterality: Left;   IR ANGIOGRAM EXTREMITY LEFT  11/14/2017   IR ANGIOGRAM SELECTIVE EACH ADDITIONAL VESSEL  11/14/2017   IR ANGIOGRAM SELECTIVE EACH ADDITIONAL VESSEL  11/14/2017   IR TIB-PERO ART PTA MOD SED  11/14/2017  IR TIB-PERO ART UNI PTA EA ADD VESSEL MOD SED  11/14/2017   IR US GUIDE VASC ACCESS LEFT  11/14/2017   PERIPHERAL VASCULAR INTERVENTION Right 08/11/2021   Procedure: PERIPHERAL VASCULAR INTERVENTION;  Surgeon: Leonie Douglas, MD;  Location: [facility];  Service: Cardiovascular;  Laterality: Right;  rt sfa stent   STUMP REVISION Right 01/09/2023   Procedure:  REVISION RIGHT BELOW KNEE AMPUTATION;  Surgeon: Nadara Mustard, MD;  Location: [facility] OR;  Service: Orthopedics;  Laterality: Right;     Social History:   reports that he has been smoking cigarettes. He has a 23.5 pack-year smoking history. He has never used smokeless tobacco. He reports that he does not drink alcohol and does not use drugs.   Family History:  His family history includes COPD in his father.   Allergies Allergies  Allergen Reactions   Ceftriaxone Itching, Nausea And Vomiting and Rash    Noted minutes after starting medication No rash noted with Ancef on 01-31-2022 during general anesthesia Has received cefepime and cefazolin without reaction     Home Medications  Prior to Admission medications   Medication Sig Start Date End Date Taking? Authorizing Provider  atorvastatin (LIPITOR) 10 MG tablet Take 10 mg by mouth daily. 06/05/23  Yes [provider]  pioglitazone (ACTOS) 15 MG tablet Take 15 mg by mouth daily. 04/24/23  Yes [provider]  acetaminophen (TYLENOL) 500 MG tablet Take 2 tablets (1,000 mg total) by mouth every 6 (six) hours as needed for mild pain or fever (For the first 3 days, may take 2 tabs (1000 mg total) 4 times daily scheduled following which can take 2 tabs (1000 mg total) 4 times daily as needed.). 01/12/23   Hongalgi, Maximino Greenland, MD  ascorbic acid (VITAMIN C) 1000 MG tablet Take 1 tablet (1,000 mg total) by mouth daily. 01/13/23   Hongalgi, Maximino Greenland, MD  docusate sodium (COLACE) 100 MG capsule Take 1 capsule (100 mg total) by mouth daily. 01/13/23   Hongalgi, Maximino Greenland, MD  gabapentin (NEURONTIN) 600 MG tablet Take 1 tablet (600 mg total) by mouth 3 (three) times daily. 10/05/21   Angiulli, Mcarthur Rossetti, PA-C  insulin aspart protamine - aspart (NOVOLOG 70/30 MIX) (70-30) 100 UNIT/ML FlexPen Inject 10 Units into the skin 2 (two) times daily with a meal. 01/12/23   Hongalgi, Maximino Greenland, MD  Insulin Pen Needle 32G X 4 MM MISC Use as directed two times daily.  01/12/23   Hongalgi, Maximino Greenland, MD  methocarbamol (ROBAXIN) 500 MG tablet Take 1 tablet (500 mg total) by mouth every 6 (six) hours as needed for muscle spasms. 01/12/23   Hongalgi, Maximino Greenland, MD  Multiple Vitamin (MULTIVITAMIN WITH MINERALS) TABS tablet Take 1 tablet by mouth daily. 01/13/23   Hongalgi, Maximino Greenland, MD  nicotine (NICODERM CQ - DOSED IN MG/24 HOURS) 14 mg/24hr patch Place 1 patch (14 mg total) onto the skin daily. 01/13/23   Hongalgi, Maximino Greenland, MD  oxycodone (OXY-IR) 5 MG capsule Take 1 capsule (5 mg total) by mouth every 6 (six) hours as needed. 02/22/23   Adonis Huguenin, NP  Oxycodone HCl 10 MG TABS Take 0.5 tablets (5 mg total) by mouth every 4 (four) hours as needed. 02/08/23   Adonis Huguenin, NP  polyethylene glycol powder (GLYCOLAX/MIRALAX) 17 GM/SCOOP powder Take 17 g by mouth daily as needed for mild constipation. 01/12/23   Hongalgi, Maximino Greenland, MD         JD  Anselm Lis [facility] 07/31/2023, 11:15 AM  Please see [email or web address] for pager details.  From 7A-7P if no response, please call [phone]. After hours, please call ELink [phone].

## 2023-07-31 NOTE — Progress Notes (Signed)
Rounding Note    Patient Name: Cody Pena Date of Encounter: 07/31/2023  [facility] Cardiologist:   Subjective   Pt laying in bed  Not as calm as yesterday   Mittens on   Inpatient Medications    Scheduled Meds:  aspirin EC  81 mg Oral Daily   atorvastatin  10 mg Oral QPM   dexamethasone (DECADRON) injection  4 mg Intravenous QHS   digoxin  0.25 mg Oral Daily   insulin aspart  0-20 Units Subcutaneous TID PC & HS   insulin glargine-yfgn  10 Units Subcutaneous Daily   metoprolol tartrate  50 mg Oral BID   QUEtiapine  25 mg Oral BID   Continuous Infusions:  sodium chloride 100 mL/hr at 07/31/23 0737   ceFEPime (MAXIPIME) IV 2 g (07/31/23 0152)   heparin 1,700 Units/hr (07/31/23 0440)   PRN Meds: diltiazem, haloperidol lactate, HYDROcodone-acetaminophen, morphine   Vital Signs    Vitals:   07/31/23 0000 07/31/23 0100 07/31/23 0200 07/31/23 0400  BP: 124/71 125/75 131/73 136/72  Pulse: 100 (!) 104 (!) 109 (!) 115  Resp: (!) 21 18 (!) 21 19  Temp:    98 F (36.7 C)  TempSrc:    Oral  SpO2: 100% 100% 100% 93%  Weight:      Height:        Intake/Output Summary (Last 24 hours) at 07/31/2023 0811 Last data filed at 07/31/2023 0737 Gross per 24 hour  Intake 1532.38 ml  Output --  Net 1532.38 ml      07/30/2023    9:25 AM 07/29/2023    2:00 AM 07/28/2023    3:18 PM  Last 3 Weights  Weight (lbs) 171 lb 4.8 oz 171 lb 4.8 oz 214 lb  Weight (kg) 77.7 kg 77.7 kg 97.07 kg      Telemetry    Tele with Afib with RVR   He has since converted to SR /ST  (around 1 AM)  - Personally Reviewed  ECG    No new  - Personally Reviewed  Physical Exam   GEN: Sl agitated, mittens on  Neck:  Difficult to assess JVP with beard  Cardiac: RRR  No murmurs   Respiratory: Clear to auscultation anteriorly   GI: Soft, nontender, non-distended  MS: No edema; s/p R BKA    Labs    High Sensitivity Troponin:   Recent Labs  Lab 07/28/23 1548 07/28/23 1756   TROPONINIHS 21* 24*     Chemistry Recent Labs  Lab 07/29/23 0422 07/29/23 0942 07/30/23 0422 07/30/23 2326 07/31/23 0233  NA 130*  --  131* 136 136  K 4.5  --  4.3 4.8 4.3  CL 97*  --  101 101 100  CO2 24  --  22 21* 23  GLUCOSE 189*  --  236* 153* 154*  BUN 30*  --  32* 32* 30*  CREATININE 1.19  --  0.90 0.91 0.90  CALCIUM 12.3*  --  11.9* 12.1* 11.7*  MG  --    < > 2.0 1.9 2.0  PROT 6.5  --  6.0*  --  6.4*  ALBUMIN 2.0*  --  2.0*  --  2.4*  AST 15  --  19  --  19  ALT 11  --  13  --  12  ALKPHOS 85  --  82  --  80  BILITOT 0.6  --  0.6  --  0.6  GFRNONAA >60  --  >60 >  60 >60  ANIONGAP 9  --  8 14 13    < > = values in this interval not displayed.    Lipids No results for input(s): "CHOL", "TRIG", "HDL", "LABVLDL", "LDLCALC", "CHOLHDL" in the last 168 hours.  Hematology Recent Labs  Lab 07/29/23 0422 07/30/23 0422 07/31/23 0233  WBC 18.7* 18.2* 19.6*  RBC 4.29 4.20* 4.78  HGB 11.3* 11.1* 12.7*  HCT 35.7* 35.5* 40.0  MCV 83.2 84.5 83.7  MCH 26.3 26.4 26.6  MCHC 31.7 31.3 31.8  RDW 14.4 14.5 14.6  PLT 391 448* 499*   Thyroid  Recent Labs  Lab 07/29/23 0422  TSH 1.533    BNP Recent Labs  Lab 07/30/23 0422 07/30/23 2326 07/31/23 0233  BNP 269.7* 516.6* 219.9*    DDimer No results for input(s): "DDIMER" in the last 168 hours.   Radiology    CT ANGIO HEAD NECK W WO CM Result Date: 07/30/2023 CLINICAL DATA:  Stroke follow-up EXAM: CT ANGIOGRAPHY HEAD AND NECK WITH AND WITHOUT CONTRAST TECHNIQUE: Multidetector CT imaging of the head and neck was performed using the standard protocol during bolus administration of intravenous contrast. Multiplanar CT image reconstructions and MIPs were obtained to evaluate the vascular anatomy. Carotid stenosis measurements (when applicable) are obtained utilizing NASCET criteria, using the distal internal carotid diameter as the denominator. RADIATION DOSE REDUCTION: This exam was performed according to the departmental  dose-optimization program which includes automated exposure control, adjustment of the mA and/or kV according to patient size and/or use of iterative reconstruction technique. CONTRAST:  75mL OMNIPAQUE IOHEXOL 350 MG/ML SOLN COMPARISON:  Head CT 07/28/2023 FINDINGS: CT HEAD FINDINGS Brain: Unchanged appearance of hemorrhagic lesion of the left posterior parietal lobe. No midline shift or other mass effect. Vascular: No hyperdense vessel or unexpected vascular calcification. Skull: The visualized skull base, calvarium and extracranial soft tissues are normal. Sinuses/Orbits: No fluid levels or advanced mucosal thickening of the visualized paranasal sinuses. No mastoid or middle ear effusion. Normal orbits. CTA NECK FINDINGS Skeleton: C3-7 ACDF. Other neck: Normal pharynx, larynx and major salivary glands. No cervical lymphadenopathy. Unremarkable thyroid gland. Upper chest: Complete opacification of the left apex. Multiple pulmonary nodules in the visualized right lung apex, measuring up to 9 mm. Fluid adenopathy in the upper mediastinum. Aortic arch: There is calcific atherosclerosis of the aortic arch. Conventional 3 vessel aortic branching pattern. RIGHT carotid system: No dissection, occlusion or aneurysm. Mild atherosclerotic calcification at the carotid bifurcation without hemodynamically significant stenosis. LEFT carotid system: No dissection, occlusion or aneurysm. There is calcified atherosclerosis extending into the proximal ICA, resulting in less than 50% stenosis. Vertebral arteries: Codominant configuration. There is no dissection, occlusion or flow-limiting stenosis to the skull base (V1-V3 segments). CTA HEAD FINDINGS POSTERIOR CIRCULATION: Vertebral arteries are normal. No proximal occlusion of the anterior or inferior cerebellar arteries. Basilar artery is normal. Superior cerebellar arteries are normal. Posterior cerebral arteries are normal. ANTERIOR CIRCULATION: Intracranial internal carotid  arteries are normal. Anterior cerebral arteries are normal. Middle cerebral arteries are normal. Venous sinuses: As permitted by contrast timing, patent. Anatomic variants: None Review of the MIP images confirms the above findings. IMPRESSION: 1. No emergent large vessel occlusion or hemodynamically significant stenosis of the head or neck. 2. Unchanged appearance of hemorrhagic lesion of the left posterior parietal lobe. 3. Complete opacification of the left thoracic apex and multiple pulmonary nodules in the visualized right lung apex, measuring up to 9 mm. These findings are consistent with known metastatic disease 4. Upper mediastinal lymphadenopathy.  Aortic Atherosclerosis (ICD10-I70.0). Electronically Signed   By: Deatra Robinson M.D.   On: 07/30/2023 19:49   VAS [place] LOWER EXTREMITY VENOUS (DVT) Result Date: 07/29/2023  Lower Venous DVT Study Patient Name:  Cody Pena  Date of Exam:   07/29/2023 Medical Rec #: [id number]        Accession #:    [id number] Date of Birth: 03-24-1961       Patient Gender: M Patient Age:   33 years Exam Location:  [facility] Procedure:      VAS [place] LOWER EXTREMITY VENOUS (DVT) Referring Phys: CHING TU --------------------------------------------------------------------------------  Indications: Pulmonary embolism.  Risk Factors: Confirmed PE Surgery RT BKA. Anticoagulation: Heparin. Comparison Study: No significant changes seen since prior exam 07/24/21 Performing Technologist: Shona Simpson  Examination Guidelines: A complete evaluation includes B-mode imaging, spectral Doppler, color Doppler, and power Doppler as needed of all accessible portions of each vessel. Bilateral testing is considered an integral part of a complete examination. Limited examinations for reoccurring indications may be performed as noted. The reflux portion of the exam is performed with the patient in reverse Trendelenburg.   +---------+---------------+---------+-----------+----------+--------------+ RIGHT    CompressibilityPhasicitySpontaneityPropertiesThrombus Aging +---------+---------------+---------+-----------+----------+--------------+ CFV      Full           Yes      Yes                                 +---------+---------------+---------+-----------+----------+--------------+ SFJ      Full                                                        +---------+---------------+---------+-----------+----------+--------------+ FV Prox  Full                                                        +---------+---------------+---------+-----------+----------+--------------+ FV Mid   Full                                                        +---------+---------------+---------+-----------+----------+--------------+ FV DistalFull                                                        +---------+---------------+---------+-----------+----------+--------------+ PFV      Full                                                        +---------+---------------+---------+-----------+----------+--------------+ POP      Full           Yes      Yes                                 +---------+---------------+---------+-----------+----------+--------------+  PTV      Full                                                        +---------+---------------+---------+-----------+----------+--------------+ PERO     Full                                                        +---------+---------------+---------+-----------+----------+--------------+   +---------+---------------+---------+-----------+----------+--------------+ LEFT     CompressibilityPhasicitySpontaneityPropertiesThrombus Aging +---------+---------------+---------+-----------+----------+--------------+ CFV      Full           Yes      Yes                                  +---------+---------------+---------+-----------+----------+--------------+ SFJ      Full                                                        +---------+---------------+---------+-----------+----------+--------------+ FV Prox  Full                                                        +---------+---------------+---------+-----------+----------+--------------+ FV Mid   Full                                                        +---------+---------------+---------+-----------+----------+--------------+ FV DistalFull                                                        +---------+---------------+---------+-----------+----------+--------------+ PFV      Full                                                        +---------+---------------+---------+-----------+----------+--------------+ POP      Full           Yes      Yes                                 +---------+---------------+---------+-----------+----------+--------------+ PTV      Full                                                        +---------+---------------+---------+-----------+----------+--------------+  PERO     Full                                                        +---------+---------------+---------+-----------+----------+--------------+     Summary: BILATERAL: - No evidence of deep vein thrombosis seen in the lower extremities, bilaterally. -No evidence of popliteal cyst, bilaterally.   *See table(s) above for measurements and observations. Electronically signed by Gerarda Fraction on 07/29/2023 at 6:50:19 PM.    Final    ECHOCARDIOGRAM COMPLETE Result Date: 07/29/2023    ECHOCARDIOGRAM REPORT   Patient Name:   Cody Pena Date of Exam: 07/29/2023 Medical Rec #:  [id number]       Height:       75.0 in Accession #:    [id number]      Weight:       171.3 lb Date of Birth:  13-Mar-1961      BSA:          2.055 m Patient Age:    62 years        BP:           140/117 mmHg Patient  Gender: M               HR:           122 bpm. Exam Location:  Inpatient Procedure: 2D Echo, Cardiac Doppler and Color Doppler Indications:    Atrial Fibrillation I48.91  History:        Patient has no prior history of Echocardiogram examinations.                 Arrythmias:Atrial Fibrillation, Signs/Symptoms:Dyspnea; Risk                 Factors:Hypertension, Diabetes, Current Smoker and Dyslipidemia.  Sonographer:    Lucendia Herrlich RCS Referring Phys: [id number] CHING T TU  Sonographer Comments: Image acquisition challenging due to uncooperative patient. IMPRESSIONS  1. Left ventricular ejection fraction, by estimation, is 30 to 35%. The left ventricle has moderately decreased function. The left ventricle demonstrates global hypokinesis. The left ventricular internal cavity size was mildly dilated. Left ventricular diastolic parameters are indeterminate.  2. Right ventricular systolic function is normal. The right ventricular size is normal. There is normal pulmonary artery systolic pressure. The estimated right ventricular systolic pressure is 25.8 mmHg.  3. The mitral valve is normal in structure. Trivial mitral valve regurgitation. No evidence of mitral stenosis.  4. The aortic valve is tricuspid. There is mild calcification of the aortic valve. Aortic valve regurgitation is trivial. No aortic stenosis is present.  5. The inferior vena cava is dilated in size with <50% respiratory variability, suggesting right atrial pressure of 15 mmHg.  6. The patient was in atrial fibrillation. FINDINGS  Left Ventricle: Left ventricular ejection fraction, by estimation, is 30 to 35%. The left ventricle has moderately decreased function. The left ventricle demonstrates global hypokinesis. The left ventricular internal cavity size was mildly dilated. There is no left ventricular hypertrophy. Left ventricular diastolic parameters are indeterminate. Right Ventricle: The right ventricular size is normal. No increase in right  ventricular wall thickness. Right ventricular systolic function is normal. There is normal pulmonary artery systolic pressure. The tricuspid regurgitant velocity is 1.64 m/s, and  with an assumed right atrial pressure of 15 mmHg, the estimated right ventricular  systolic pressure is 25.8 mmHg. Left Atrium: Left atrial size was normal in size. Right Atrium: Right atrial size was normal in size. Pericardium: There is no evidence of pericardial effusion. Mitral Valve: The mitral valve is normal in structure. There is mild thickening of the mitral valve leaflet(s). Trivial mitral valve regurgitation. No evidence of mitral valve stenosis. Tricuspid Valve: The tricuspid valve is normal in structure. Tricuspid valve regurgitation is trivial. Aortic Valve: The aortic valve is tricuspid. There is mild calcification of the aortic valve. Aortic valve regurgitation is trivial. No aortic stenosis is present. Aortic valve peak gradient measures 9.7 mmHg. Pulmonic Valve: The pulmonic valve was normal in structure. Pulmonic valve regurgitation is not visualized. Aorta: The aortic root is normal in size and structure. Venous: The inferior vena cava is dilated in size with less than 50% respiratory variability, suggesting right atrial pressure of 15 mmHg. IAS/Shunts: No atrial level shunt detected by color flow Doppler.  LEFT VENTRICLE PLAX 2D LVIDd:         5.50 cm   Diastology LVIDs:         4.60 cm   LV e' medial:    9.64 cm/s LV PW:         1.00 cm   LV E/e' medial:  10.8 LV IVS:        0.90 cm   LV e' lateral:   14.10 cm/s LVOT diam:     2.10 cm   LV E/e' lateral: 7.4 LV SV:         62 LV SV Index:   30 LVOT Area:     3.46 cm  RIGHT VENTRICLE             IVC RV S prime:     15.50 cm/s  IVC diam: 2.40 cm TAPSE (M-mode): 2.1 cm LEFT ATRIUM         Index LA diam:    3.90 cm 1.90 cm/m  AORTIC VALVE AV Area (Vmax): 2.73 cm AV Vmax:        155.50 cm/s AV Peak Grad:   9.7 mmHg LVOT Vmax:      122.67 cm/s LVOT Vmean:     80.267 cm/s  LVOT VTI:       0.180 m  AORTA Ao Root diam: 3.70 cm Ao Asc diam:  3.60 cm MITRAL VALVE                TRICUSPID VALVE MV Area (PHT): 5.84 cm     TR Peak grad:   10.8 mmHg MV Decel Time: 130 msec     TR Vmax:        164.00 cm/s MV E velocity: 104.00 cm/s MV A velocity: 70.50 cm/s   SHUNTS MV E/A ratio:  1.48         Systemic VTI:  0.18 m                             Systemic Diam: 2.10 cm Dalton McleanMD Electronically signed by Wilfred Lacy Signature Date/Time: 07/29/2023/5:47:50 PM    Final    EEG adult Result Date: 07/29/2023 Cody Quest, MD     07/29/2023 11:52 AM Patient Name: Cody Pena MRN: [id number] Epilepsy Attending: Charlsie Pena Referring Physician/Provider: Caryl Pina, MD Date: 07/29/2023 Duration: 25.56 mins Patient history: 62 yo M presented to the ED yesterday afternoon with a one week history of AMS and back pain. EEG to evaluate for  seizure Level of alertness: Awake AEDs during EEG study: None Technical aspects: This EEG study was done with scalp electrodes positioned according to the 10-20 International system of electrode placement. Electrical activity was reviewed with band pass filter of 1-70Hz , sensitivity of 7 uV/mm, display speed of 10mm/sec with a 60Hz  notched filter applied as appropriate. EEG data were recorded continuously and digitally stored.  Video monitoring was available and reviewed as appropriate. Description: No posterior dominant rhythm was seen. EEG showed continuous generalized 3 to 6 Hz theta-delta slowing admixed with 12-14 Hz beta activity distributed symmetrically and diffusely.  Physiologic photic driving was not seen during photic stimulation.  Hyperventilation was not performed.   ABNORMALITY - Continuous slow, generalized IMPRESSION: This study is suggestive of moderate diffuse encephalopathy. No seizures or epileptiform discharges were seen throughout the recording. Cody Pena     Cardiac Studies   Echo   07/29/23   1. Left  ventricular ejection fraction, by estimation, is 30 to 35%. The  left ventricle has moderately decreased function. The left ventricle  demonstrates global hypokinesis. The left ventricular internal cavity size  was mildly dilated. Left ventricular  diastolic parameters are indeterminate.   2. Right ventricular systolic function is normal. The right ventricular  size is normal. There is normal pulmonary artery systolic pressure. The  estimated right ventricular systolic pressure is 25.8 mmHg.   3. The mitral valve is normal in structure. Trivial mitral valve  regurgitation. No evidence of mitral stenosis.   4. The aortic valve is tricuspid. There is mild calcification of the  aortic valve. Aortic valve regurgitation is trivial. No aortic stenosis is  present.   5. The inferior vena cava is dilated in size with <50% respiratory  variability, suggesting right atrial pressure of 15 mmHg.   6. The patient was in atrial fibrillation.    Patient Profile     Cody Pena is a 62 y.o. 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 Thedore Mins.   Assessment & Plan    1  Atrial fibrillation     Pt developed afib with RVR last evening (had transitioned to po meds yesterday    Then converted to SR  Looks like occurred after IV dilt   HRs now low 100s  I would increase metoprolol to 75 bid    Keep on digoxin 0.25 for now    On IV heparin  2  HFrEF   I have reviewed echo images  Extremely poor acoustic windows    LVEF may not be quite as low as estimated   I have ordered limited echo with Definity LV dysfunction may be tachy induced, though pt does have atherosclerosis on CT scan  Volume status not bad   Watch as hydrate   3 Pulmonary  LUL mass   Bronch with Bx yesterday   Path pending   4   Neuro  R parietal lobe  Felt embolic  5  PE   RLL PE   6  HTN    BP is OK  7  Hypercalcemia   Getting IV fluids , calcitonin, Zometa   Follow volume status closely     8  HL  Continue LIpitor     8   DM   A1C 9.9  For questions or updates, please contact [facility] Please consult [email or web address] for contact info under        Signed, Dietrich Pates, MD  07/31/2023, 8:11 AM

## 2023-07-31 NOTE — Plan of Care (Signed)
  Problem: Education: Goal: Ability to describe self-care measures that may prevent or decrease complications (Diabetes Survival Skills Education) will improve Outcome: Progressing Goal: Individualized Educational Video(s) Outcome: Progressing   Problem: Coping: Goal: Ability to adjust to condition or change in health will improve Outcome: Progressing   Problem: Fluid Volume: Goal: Ability to maintain a balanced intake and output will improve Outcome: Progressing   Problem: Health Behavior/Discharge Planning: Goal: Ability to identify and utilize available resources and services will improve Outcome: Progressing Goal: Ability to manage health-related needs will improve Outcome: Progressing   Problem: Metabolic: Goal: Ability to maintain appropriate glucose levels will improve Outcome: Progressing   Problem: Nutritional: Goal: Maintenance of adequate nutrition will improve Outcome: Progressing Goal: Progress toward achieving an optimal weight will improve Outcome: Progressing   Problem: Skin Integrity: Goal: Risk for impaired skin integrity will decrease Outcome: Progressing   Problem: Tissue Perfusion: Goal: Adequacy of tissue perfusion will improve Outcome: Progressing   Problem: Education: Goal: Knowledge of General Education information will improve Description: Including pain rating scale, medication(s)/side effects and non-pharmacologic comfort measures Outcome: Progressing   Problem: Health Behavior/Discharge Planning: Goal: Ability to manage health-related needs will improve Outcome: Progressing   Problem: Clinical Measurements: Goal: Ability to maintain clinical measurements within normal limits will improve Outcome: Progressing Goal: Will remain free from infection Outcome: Progressing Goal: Diagnostic test results will improve Outcome: Progressing Goal: Respiratory complications will improve Outcome: Progressing Goal: Cardiovascular complication will  be avoided Outcome: Progressing   Problem: Activity: Goal: Risk for activity intolerance will decrease Outcome: Progressing   Problem: Nutrition: Goal: Adequate nutrition will be maintained Outcome: Progressing   Problem: Coping: Goal: Level of anxiety will decrease Outcome: Progressing   Problem: Elimination: Goal: Will not experience complications related to bowel motility Outcome: Progressing Goal: Will not experience complications related to urinary retention Outcome: Progressing   Problem: Pain Management: Goal: General experience of comfort will improve Outcome: Progressing   Problem: Safety: Goal: Ability to remain free from injury will improve Outcome: Progressing   Problem: Skin Integrity: Goal: Risk for impaired skin integrity will decrease Outcome: Progressing   Problem: Safety: Goal: Non-violent Restraint(s) Outcome: Progressing

## 2023-07-31 NOTE — Progress Notes (Signed)
Brief cardiology update note:  Reached out to by primary team regarding A-fib with RVR.  Review of telemetry shows that rate has been consistently in the 120-130 bpm range - atrial fibrillation.  Has known reduced LVEF 30-35%.  Being treated for sepsis secondary to pyelonephritis, pulmonary embolism, malignancy related hypercalcemia and brain mass with vasogenic edema requiring high-dose steroids all of which are significant contributors to persistent atrial fibrillation with RVR.  Previously on a diltiazem drip and now on p.o. metoprolol and p.o. diltiazem.  Remains on heparin drip with sub-therapeutic heparin levels.  Fortunately, blood pressure has remained stable and left lower extremity is warm.  Amiodarone not a good option as he is not therapeutically anticoagulated at this time.  Given high doses of beta-blockade and calcium channel blockade (in the context of reduced EF) that he is currently on, will need to have ongoing discussion regarding initiation of amiodarone for further rate control and risk of chemical cardioversion.  Electrical cardioversion is unlikely to provide much yield as the above stressors are still present and recurrence of atrial fibrillation following cardioversion is extremely high.  Digoxin has also been ordered.  Will continue current medication regimen for now and monitor response.  Marya Amsler, MD

## 2023-07-31 NOTE — Consult Note (Signed)
Radiation Oncology         [phone] ________________________________  Initial inpatient Consultation  Name: CHRYSTIAN LODATO MRN: [id number]  Date of Service: 07/28/2023 DOB: 1961/06/10  XB:[id number], Merce Family  No ref. 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 Pyelonephritis  N12     2. Altered mental status, unspecified altered mental status type  R41.82     3. Lung mass  R91.8 morphine 2 MG/ML injection 2-4 mg    4. Brain mass  G93.89     5. Atrial fibrillation, unspecified type (HCC)  I48.91     6. Hypercalcemia  E83.52 zoledronic acid (ZOMETA) 4 mg in sodium chloride 0.9 % 100 mL IVPB      HISTORY OF PRESENT ILLNESS: OSBON SVAY is a 62 y.o. male seen at the request of Dr. Truett Perna.  He presented to the emergency department with altered mental status and complaints of progressive low back pain s/p a fall at home approximately 2 weeks prior.  He was in rapid A-fib with RVR and urosepsis secondary to bilateral pyelonephritis on admission. Imaging, including CT head, chest/abdomen/pelvis were performed and concerning for a large heterogenous mass in the suprahilar region extending into the left upper lobe lung with occlusion of the LUL bronchus as well as multiple pulmonary nodules and mediastinal, bilateral hilar, and left axillary lymphadenopathy consistent with metastases.  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  CT head showed a complex cystic and solid 3.4cm mass in left parieto-occipital lobe with some mild associated hyperdensity  that could represent mineralization and or hemorrhage.  There was surrounding edema and mild regional mass effect without midline shift.  MRI brain confirmed a 4.0 x 3.5 x 3.7 cm mixed cystic and solid peripherally enhancing mass at the left occipital lobe, most concerning for a solitary intracranial metastasis with mild surrounding edema without significant regional mass effect or midline shift.  CT lumbar spine for evaluation of the low back pain showed a mild superior endplate compression deformity of L1, age-indeterminate without recent comparison. Lumbar MRI confirmed abnormal marrow replacement with heterogeneous enhancement within the L1 vertebral body, concerning for osseous metastatic disease with associated mild pathologic compression fracture without bony retropulsion and no extraosseous or epidural extension of tumor.  No other evidence for metastatic disease within the lumbar spine. A CT angio chest performed on 07/29/2023 did show a small subsegmental PE in the right lower lobe lung with no evidence of right heart strain.  We have been asked to consult the patient for consideration of radiotherapy to the solitary brain metastasis.  We have reviewed his imaging and it appears that he may also benefit from palliative chest radiation to the very large left upper lobe lung mass.  He had bronchoscopy for tissue confirmation on 07/30/2023 under the care of Dr. Tonia Brooms.  Results are pending.  PREVIOUS RADIATION THERAPY: No  PAST MEDICAL HISTORY:  Past Medical History:  Diagnosis Date   Arthritis    COVID    has had Covid 3 times, not sick with it   COVID    has had 3 times mild to  no symptoms   Depression    Diabetes mellitus without complication (HCC)    pt states he has now been told that he is a type 1 diabetic.   Dyspnea    albuterol inhaler   HLD (hyperlipidemia)    Hypertension    Peripheral vascular disease (HCC)       PAST SURGICAL HISTORY: Past Surgical History:  Procedure  Laterality Date   ABDOMINAL AORTOGRAM W/LOWER EXTREMITY N/A 08/11/2021   Procedure: ABDOMINAL AORTOGRAM W/LOWER EXTREMITY;  Surgeon: Leonie Douglas, MD;  Location: [facility];  Service: Cardiovascular;  Laterality: N/A;   AMPUTATION Left 11/15/2017   Procedure: LEFT FOOT 2ND RAY AMPUTATION;  Surgeon: Nadara Mustard, MD;  Location: [facility] OR;  Service: Orthopedics;  Laterality: Left;   AMPUTATION Right 08/25/2021   Procedure: RIGHT TRANSMETATARSAL AMPUTATION;  Surgeon: Nadara Mustard, MD;  Location: [facility] OR;  Service: Orthopedics;  Laterality: Right;   AMPUTATION Right 09/27/2021   Procedure: BELOW KNEE AMPUTATION;  Surgeon: Nadara Mustard, MD;  Location: [facility] OR;  Service: Orthopedics;  Laterality: Right;   APPLICATION OF WOUND VAC  12/27/2021   Procedure: APPLICATION OF WOUND VAC;  Surgeon: Nadara Mustard, MD;  Location: [facility] OR;  Service: Orthopedics;;   CERVICAL FUSION     I & D EXTREMITY Left 12/27/2021   Procedure: LEFT LEG DEBRIDEMENT ULCERS;  Surgeon: Nadara Mustard, MD;  Location: [facility] OR;  Service: Orthopedics;  Laterality: Left;   I & D EXTREMITY Left 01/31/2022   Procedure: LEFT KNEE DEBRIDEMENT AND APPLY TISSUE GRAFT;  Surgeon: Nadara Mustard, MD;  Location: [facility] OR;  Service: Orthopedics;  Laterality: Left;   IR ANGIOGRAM EXTREMITY LEFT  11/14/2017   IR ANGIOGRAM SELECTIVE EACH ADDITIONAL VESSEL  11/14/2017   IR ANGIOGRAM SELECTIVE EACH ADDITIONAL VESSEL  11/14/2017   IR TIB-PERO ART PTA MOD SED  11/14/2017   IR TIB-PERO ART UNI PTA EA ADD VESSEL MOD SED  11/14/2017   IR US GUIDE VASC ACCESS LEFT  11/14/2017   PERIPHERAL VASCULAR INTERVENTION Right 08/11/2021   Procedure: PERIPHERAL VASCULAR INTERVENTION;  Surgeon: Leonie Douglas, MD;  Location: [facility];  Service: Cardiovascular;  Laterality: Right;  rt sfa stent   STUMP REVISION Right 01/09/2023   Procedure: REVISION RIGHT BELOW KNEE AMPUTATION;  Surgeon: Nadara Mustard, MD;  Location: [facility] OR;  Service: Orthopedics;  Laterality: Right;     FAMILY HISTORY:  Family History  Problem Relation Age of Onset   COPD Father     SOCIAL HISTORY:  Social History   Socioeconomic History   Marital status: Married    Spouse name: Not on file   Number of children: Not on file   Years of education: Not on file   Highest education level: Not on file  Occupational History   Not on file  Tobacco Use   Smoking status: Every Day    Current packs/day: 0.50    Average packs/day: 0.5 packs/day for 47.0 years (23.5 ttl pk-yrs)    Types: Cigarettes   Smokeless tobacco: Never  Vaping Use   Vaping status: Some Days  Substance and Sexual Activity   Alcohol use: No   Drug use: No   Sexual activity: Not on file  Other Topics Concern   Not on file  Social History Narrative   Not on file   Social Drivers of Health   Financial Resource Strain: Not on File (01/10/2023)   Received from [place] [employer]  Resource Strain    Physicist, medical Strain: 0  Food Insecurity: No Food Insecurity (07/29/2023)   Hunger Vital Sign    Worried About Running Out of Food in the Last Year: Never true    Ran Out of Food in the Last Year: Never true  Transportation Needs: No Transportation Needs (07/29/2023)   PRAPARE - Administrator, Civil Service (Medical): No    Lack of Transportation (Non-Medical): No  Physical Activity: Not on File (01/10/2023)   Received from [place], [place]   Physical Activity    Physical Activity: 0  Stress: Not on File (01/10/2023)   Received from [facility] [place]   Stress    Stress: 0  Social Connections: Not on File (01/10/2023)   Received from [place], [place]   Social Connections    Social Connections and Isolation: 0  Intimate Partner Violence: Not At Risk (07/29/2023)   Humiliation, Afraid, Rape, and Kick questionnaire    Fear of Current or Ex-Partner: No    Emotionally Abused: No    Physically Abused: No    Sexually Abused: No    ALLERGIES: Ceftriaxone  MEDICATIONS:  Current  Facility-Administered Medications  Medication Dose Route Frequency Provider Last Rate Last Admin   0.9 %  sodium chloride infusion   Intravenous Continuous Leroy Sea, MD 100 mL/hr at 07/31/23 0800 New Bag at 07/31/23 0800   apixaban (ELIQUIS) tablet 5 mg  5 mg Oral BID Leroy Sea, MD   5 mg at 07/31/23 1207   aspirin EC tablet 81 mg  81 mg Oral Daily Caryl Pina, MD   81 mg at 07/31/23 1207   atorvastatin (LIPITOR) tablet 10 mg  10 mg Oral QPM de Saintclair Halsted, Cortney E, NP   10 mg at 07/30/23 1747   calcitonin (MIACALCIN) injection 310 Units  4 Units/kg Subcutaneous BID Leroy Sea, MD       ceFEPIme (MAXIPIME) 2 g in sodium chloride 0.9 % 100 mL IVPB  2 g Intravenous Q8H Doristine Counter, RPH 200 mL/hr at 07/31/23 1208 2 g at 07/31/23 1208   dexamethasone (DECADRON) injection 4 mg  4 mg Intravenous QHS Tu, Ching T, DO   4 mg at 07/30/23 2145   digoxin (LANOXIN) tablet 0.25 mg  0.25 mg Oral Daily Creig Hines, NP   0.25 mg at 07/31/23 1207   diltiazem (CARDIZEM) injection 10 mg  10 mg Intravenous Q6H PRN Sundil, Subrina, MD       haloperidol lactate (HALDOL) injection 2 mg  2 mg Intramuscular Q6H PRN Leroy Sea, MD   2 mg at 07/30/23 2143   HYDROcodone-acetaminophen (NORCO/VICODIN) 5-325 MG per tablet 1-2 tablet  1-2 tablet Oral Q6H PRN Leroy Sea, MD   1 tablet at 07/31/23 1207   insulin aspart (novoLOG) injection 0-20 Units  0-20 Units Subcutaneous TID PC & HS Tu, Ching T, DO   7 Units at 07/31/23 1000   insulin glargine-yfgn (SEMGLEE) injection 10 Units  10 Units Subcutaneous Daily Leroy Sea, MD   10 Units at 07/31/23 1225   metoprolol tartrate (LOPRESSOR) tablet 75 mg  75 mg Oral BID Pricilla Riffle, MD   75 mg at 07/31/23 1206   morphine 2 MG/ML injection 2-4 mg  2-4 mg Intravenous Q3H PRN Ladene Artist, MD   2 mg at 07/31/23 0447   QUEtiapine (SEROQUEL) tablet 25 mg  25 mg Oral BID Leroy Sea, MD   25 mg at 07/31/23 1208  REVIEW OF SYSTEMS:  ROS documented on admission as follows Has low back pain. No headache, fever, CP or SOB. Does not endorse any additional symptoms except as noted in the HPI.     PHYSICAL EXAM:  Wt Readings from Last 3 Encounters:  07/30/23 171 lb 4.8 oz (77.7 kg)  01/06/23 214 lb 15.2 oz (97.5 kg)  01/31/22 230 lb (104.3 kg)   Temp Readings from Last 3 Encounters:  07/31/23 (!) 97.3 F (36.3 C) (Oral)  01/12/23 98 F (36.7 C) (Oral)  01/31/22 (!) 97 F (36.1 C)   BP Readings from Last 3 Encounters:  07/31/23 (!) 146/80  01/12/23 106/63  01/31/22 131/69   Pulse Readings from Last 3 Encounters:  07/31/23 (!) 104  01/12/23 68  01/31/22 81   Pain Assessment Pain Score: 8 /10  In general this is an ill appearing somnolent patient arousable for a few questions, which he answers appropriately before dozing off.  Suspect sedation effect.   KPS = 40  100 - Normal; no complaints; no evidence of disease. 90   - Able to carry on normal activity; minor signs or symptoms of disease. 80   - Normal activity with effort; some signs or symptoms of disease. 97   - Cares for self; unable to carry on normal activity or to do active work. 60   - Requires occasional assistance, but is able to care for most of his personal needs. 50   - Requires considerable assistance and frequent medical care. 40   - Disabled; requires special care and assistance. 30   - Severely disabled; hospital admission is indicated although death not imminent. 20   - Very sick; hospital admission necessary; active supportive treatment necessary. 10   - Moribund; fatal processes progressing rapidly. 0     - Dead  Karnofsky DA, Abelmann WH, Craver LS and Burchenal JH [phone]) The use of the nitrogen mustards in the palliative treatment of carcinoma: with particular reference to bronchogenic carcinoma Cancer 1 634-56  LABORATORY DATA:  Lab Results  Component Value Date   WBC 19.6 (H) 07/31/2023   HGB 12.7  (L) 07/31/2023   HCT 40.0 07/31/2023   MCV 83.7 07/31/2023   PLT 499 (H) 07/31/2023   Lab Results  Component Value Date   NA 136 07/31/2023   K 4.3 07/31/2023   CL 100 07/31/2023   CO2 23 07/31/2023   Lab Results  Component Value Date   ALT 12 07/31/2023   AST 19 07/31/2023   ALKPHOS 80 07/31/2023   BILITOT 0.6 07/31/2023     RADIOGRAPHY: CT ANGIO HEAD NECK W WO CM Result Date: 07/30/2023 CLINICAL DATA:  Stroke follow-up EXAM: CT ANGIOGRAPHY HEAD AND NECK WITH AND WITHOUT CONTRAST TECHNIQUE: Multidetector CT imaging of the head and neck was performed using the standard protocol during bolus administration of intravenous contrast. Multiplanar CT image reconstructions and MIPs were obtained to evaluate the vascular anatomy. Carotid stenosis measurements (when applicable) are obtained utilizing NASCET criteria, using the distal internal carotid diameter as the denominator. RADIATION DOSE REDUCTION: This exam was performed according to the departmental dose-optimization program which includes automated exposure control, adjustment of the mA and/or kV according to patient size and/or use of iterative reconstruction technique. CONTRAST:  75mL OMNIPAQUE IOHEXOL 350 MG/ML SOLN COMPARISON:  Head CT 07/28/2023 FINDINGS: CT HEAD FINDINGS Brain: Unchanged appearance of hemorrhagic lesion of the left posterior parietal lobe. No midline shift or other mass effect. Vascular: No hyperdense vessel or unexpected vascular calcification.  Skull: The visualized skull base, calvarium and extracranial soft tissues are normal. Sinuses/Orbits: No fluid levels or advanced mucosal thickening of the visualized paranasal sinuses. No mastoid or middle ear effusion. Normal orbits. CTA NECK FINDINGS Skeleton: C3-7 ACDF. Other neck: Normal pharynx, larynx and major salivary glands. No cervical lymphadenopathy. Unremarkable thyroid gland. Upper chest: Complete opacification of the left apex. Multiple pulmonary nodules in the  visualized right lung apex, measuring up to 9 mm. Fluid adenopathy in the upper mediastinum. Aortic arch: There is calcific atherosclerosis of the aortic arch. Conventional 3 vessel aortic branching pattern. RIGHT carotid system: No dissection, occlusion or aneurysm. Mild atherosclerotic calcification at the carotid bifurcation without hemodynamically significant stenosis. LEFT carotid system: No dissection, occlusion or aneurysm. There is calcified atherosclerosis extending into the proximal ICA, resulting in less than 50% stenosis. Vertebral arteries: Codominant configuration. There is no dissection, occlusion or flow-limiting stenosis to the skull base (V1-V3 segments). CTA HEAD FINDINGS POSTERIOR CIRCULATION: Vertebral arteries are normal. No proximal occlusion of the anterior or inferior cerebellar arteries. Basilar artery is normal. Superior cerebellar arteries are normal. Posterior cerebral arteries are normal. ANTERIOR CIRCULATION: Intracranial internal carotid arteries are normal. Anterior cerebral arteries are normal. Middle cerebral arteries are normal. Venous sinuses: As permitted by contrast timing, patent. Anatomic variants: None Review of the MIP images confirms the above findings. IMPRESSION: 1. No emergent large vessel occlusion or hemodynamically significant stenosis of the head or neck. 2. Unchanged appearance of hemorrhagic lesion of the left posterior parietal lobe. 3. Complete opacification of the left thoracic apex and multiple pulmonary nodules in the visualized right lung apex, measuring up to 9 mm. These findings are consistent with known metastatic disease 4. Upper mediastinal lymphadenopathy. Aortic Atherosclerosis (ICD10-I70.0). Electronically Signed   By: Deatra Robinson M.D.   On: 07/30/2023 19:49   VAS [place] LOWER EXTREMITY VENOUS (DVT) Result Date: 07/29/2023  Lower Venous DVT Study Patient Name:  HARLIS CAFFEE  Date of Exam:   07/29/2023 Medical Rec #: [id number]        Accession #:     [id number] Date of Birth: Aug 07, 1961       Patient Gender: M Patient Age:   64 years Exam Location:  [facility] Procedure:      VAS [place] LOWER EXTREMITY VENOUS (DVT) Referring Phys: CHING TU --------------------------------------------------------------------------------  Indications: Pulmonary embolism.  Risk Factors: Confirmed PE Surgery RT BKA. Anticoagulation: Heparin. Comparison Study: No significant changes seen since prior exam 07/24/21 Performing Technologist: Shona Simpson  Examination Guidelines: A complete evaluation includes B-mode imaging, spectral Doppler, color Doppler, and power Doppler as needed of all accessible portions of each vessel. Bilateral testing is considered an integral part of a complete examination. Limited examinations for reoccurring indications may be performed as noted. The reflux portion of the exam is performed with the patient in reverse Trendelenburg.  +---------+---------------+---------+-----------+----------+--------------+ RIGHT    CompressibilityPhasicitySpontaneityPropertiesThrombus Aging +---------+---------------+---------+-----------+----------+--------------+ CFV      Full           Yes      Yes                                 +---------+---------------+---------+-----------+----------+--------------+ SFJ      Full                                                        +---------+---------------+---------+-----------+----------+--------------+  FV Prox  Full                                                        +---------+---------------+---------+-----------+----------+--------------+ FV Mid   Full                                                        +---------+---------------+---------+-----------+----------+--------------+ FV DistalFull                                                        +---------+---------------+---------+-----------+----------+--------------+ PFV      Full                                                         +---------+---------------+---------+-----------+----------+--------------+ POP      Full           Yes      Yes                                 +---------+---------------+---------+-----------+----------+--------------+ PTV      Full                                                        +---------+---------------+---------+-----------+----------+--------------+ PERO     Full                                                        +---------+---------------+---------+-----------+----------+--------------+   +---------+---------------+---------+-----------+----------+--------------+ LEFT     CompressibilityPhasicitySpontaneityPropertiesThrombus Aging +---------+---------------+---------+-----------+----------+--------------+ CFV      Full           Yes      Yes                                 +---------+---------------+---------+-----------+----------+--------------+ SFJ      Full                                                        +---------+---------------+---------+-----------+----------+--------------+ FV Prox  Full                                                        +---------+---------------+---------+-----------+----------+--------------+  FV Mid   Full                                                        +---------+---------------+---------+-----------+----------+--------------+ FV DistalFull                                                        +---------+---------------+---------+-----------+----------+--------------+ PFV      Full                                                        +---------+---------------+---------+-----------+----------+--------------+ POP      Full           Yes      Yes                                 +---------+---------------+---------+-----------+----------+--------------+ PTV      Full                                                         +---------+---------------+---------+-----------+----------+--------------+ PERO     Full                                                        +---------+---------------+---------+-----------+----------+--------------+     Summary: BILATERAL: - No evidence of deep vein thrombosis seen in the lower extremities, bilaterally. -No evidence of popliteal cyst, bilaterally.   *See table(s) above for measurements and observations. Electronically signed by Gerarda Fraction on 07/29/2023 at 6:50:19 PM.    Final    ECHOCARDIOGRAM COMPLETE Result Date: 07/29/2023    ECHOCARDIOGRAM REPORT   Patient Name:   JOSHUWA MESSEX Date of Exam: 07/29/2023 Medical Rec #:  [id number]       Height:       75.0 in Accession #:    [id number]      Weight:       171.3 lb Date of Birth:  Dec 22, 1960      BSA:          2.055 m Patient Age:    62 years        BP:           140/117 mmHg Patient Gender: M               HR:           122 bpm. Exam Location:  Inpatient Procedure: 2D Echo, Cardiac Doppler and Color Doppler Indications:    Atrial Fibrillation I48.91  History:        Patient has no prior history of Echocardiogram examinations.  Arrythmias:Atrial Fibrillation, Signs/Symptoms:Dyspnea; Risk                 Factors:Hypertension, Diabetes, Current Smoker and Dyslipidemia.  Sonographer:    Lucendia Herrlich RCS Referring Phys: [id number] CHING T TU  Sonographer Comments: Image acquisition challenging due to uncooperative patient. IMPRESSIONS  1. Left ventricular ejection fraction, by estimation, is 30 to 35%. The left ventricle has moderately decreased function. The left ventricle demonstrates global hypokinesis. The left ventricular internal cavity size was mildly dilated. Left ventricular diastolic parameters are indeterminate.  2. Right ventricular systolic function is normal. The right ventricular size is normal. There is normal pulmonary artery systolic pressure. The estimated right ventricular systolic pressure is 25.8  mmHg.  3. The mitral valve is normal in structure. Trivial mitral valve regurgitation. No evidence of mitral stenosis.  4. The aortic valve is tricuspid. There is mild calcification of the aortic valve. Aortic valve regurgitation is trivial. No aortic stenosis is present.  5. The inferior vena cava is dilated in size with <50% respiratory variability, suggesting right atrial pressure of 15 mmHg.  6. The patient was in atrial fibrillation. FINDINGS  Left Ventricle: Left ventricular ejection fraction, by estimation, is 30 to 35%. The left ventricle has moderately decreased function. The left ventricle demonstrates global hypokinesis. The left ventricular internal cavity size was mildly dilated. There is no left ventricular hypertrophy. Left ventricular diastolic parameters are indeterminate. Right Ventricle: The right ventricular size is normal. No increase in right ventricular wall thickness. Right ventricular systolic function is normal. There is normal pulmonary artery systolic pressure. The tricuspid regurgitant velocity is 1.64 m/s, and  with an assumed right atrial pressure of 15 mmHg, the estimated right ventricular systolic pressure is 25.8 mmHg. Left Atrium: Left atrial size was normal in size. Right Atrium: Right atrial size was normal in size. Pericardium: There is no evidence of pericardial effusion. Mitral Valve: The mitral valve is normal in structure. There is mild thickening of the mitral valve leaflet(s). Trivial mitral valve regurgitation. No evidence of mitral valve stenosis. Tricuspid Valve: The tricuspid valve is normal in structure. Tricuspid valve regurgitation is trivial. Aortic Valve: The aortic valve is tricuspid. There is mild calcification of the aortic valve. Aortic valve regurgitation is trivial. No aortic stenosis is present. Aortic valve peak gradient measures 9.7 mmHg. Pulmonic Valve: The pulmonic valve was normal in structure. Pulmonic valve regurgitation is not visualized. Aorta: The  aortic root is normal in size and structure. Venous: The inferior vena cava is dilated in size with less than 50% respiratory variability, suggesting right atrial pressure of 15 mmHg. IAS/Shunts: No atrial level shunt detected by color flow Doppler.  LEFT VENTRICLE PLAX 2D LVIDd:         5.50 cm   Diastology LVIDs:         4.60 cm   LV e' medial:    9.64 cm/s LV PW:         1.00 cm   LV E/e' medial:  10.8 LV IVS:        0.90 cm   LV e' lateral:   14.10 cm/s LVOT diam:     2.10 cm   LV E/e' lateral: 7.4 LV SV:         62 LV SV Index:   30 LVOT Area:     3.46 cm  RIGHT VENTRICLE             IVC RV S prime:     15.50 cm/s  IVC  diam: 2.40 cm TAPSE (M-mode): 2.1 cm LEFT ATRIUM         Index LA diam:    3.90 cm 1.90 cm/m  AORTIC VALVE AV Area (Vmax): 2.73 cm AV Vmax:        155.50 cm/s AV Peak Grad:   9.7 mmHg LVOT Vmax:      122.67 cm/s LVOT Vmean:     80.267 cm/s LVOT VTI:       0.180 m  AORTA Ao Root diam: 3.70 cm Ao Asc diam:  3.60 cm MITRAL VALVE                TRICUSPID VALVE MV Area (PHT): 5.84 cm     TR Peak grad:   10.8 mmHg MV Decel Time: 130 msec     TR Vmax:        164.00 cm/s MV E velocity: 104.00 cm/s MV A velocity: 70.50 cm/s   SHUNTS MV E/A ratio:  1.48         Systemic VTI:  0.18 m                             Systemic Diam: 2.10 cm Dalton McleanMD Electronically signed by Wilfred Lacy Signature Date/Time: 07/29/2023/5:47:50 PM    Final    EEG adult Result Date: 07/29/2023 Charlsie Quest, MD     07/29/2023 11:52 AM Patient Name: JASIAS HARDIMAN MRN: [id number] Epilepsy Attending: Charlsie Quest Referring Physician/Provider: Caryl Pina, MD Date: 07/29/2023 Duration: 25.56 mins Patient history: 62 yo M presented to the ED yesterday afternoon with a one week history of AMS and back pain. EEG to evaluate for seizure Level of alertness: Awake AEDs during EEG study: None Technical aspects: This EEG study was done with scalp electrodes positioned according to the 10-20 International system of  electrode placement. Electrical activity was reviewed with band pass filter of 1-70Hz , sensitivity of 7 uV/mm, display speed of 22mm/sec with a 60Hz  notched filter applied as appropriate. EEG data were recorded continuously and digitally stored.  Video monitoring was available and reviewed as appropriate. Description: No posterior dominant rhythm was seen. EEG showed continuous generalized 3 to 6 Hz theta-delta slowing admixed with 12-14 Hz beta activity distributed symmetrically and diffusely.  Physiologic photic driving was not seen during photic stimulation.  Hyperventilation was not performed.   ABNORMALITY - Continuous slow, generalized IMPRESSION: This study is suggestive of moderate diffuse encephalopathy. No seizures or epileptiform discharges were seen throughout the recording. Charlsie Quest    CT Angio Chest Pulmonary Embolism (PE) W or WO Contrast Result Date: 07/29/2023 CLINICAL DATA:  Pulmonary embolism suspected. Positive D-dimer. CT yesterday demonstrating a large left upper lobe neoplasm with mediastinal and hilar adenopathy and bronchovascular encasement. EXAM: CT ANGIOGRAPHY CHEST WITH CONTRAST TECHNIQUE: Multidetector CT imaging of the chest was performed using the standard protocol during bolus administration of intravenous contrast. Multiplanar CT image reconstructions and MIPs were obtained to evaluate the vascular anatomy. RADIATION DOSE REDUCTION: This exam was performed according to the departmental dose-optimization program which includes automated exposure control, adjustment of the mA and/or kV according to patient size and/or use of iterative reconstruction technique. CONTRAST:  75mL OMNIPAQUE IOHEXOL 350 MG/ML SOLN COMPARISON:  CT chest, abdomen and pelvis with IV contrast yesterday at 5:28 p.m. (current exam 07/29/2023 at 12:08 a.m.). FINDINGS: Cardiovascular: There is a small embolic burden in the right lower lobe with nonocclusive thrombus within a posterior basal subsegmental  branch likely extending into 2 of its downstream small branches. There is no evidence of right heart strain and no further embolus is seen. Bronchovascular tumor encasement narrows the distal left main pulmonary artery and it appears to almost completely occlude the left upper lobe pulmonary artery, and occludes the left superior pulmonary vein. Pulmonary trunk is upper limits of normal in caliber. Cardiac size is normal. There is a small pericardial effusion. There is no venous dilatation. Aortic and three-vessel coronary artery atherosclerosis is present but no aneurysm, stenosis or dissection, with scattered plaque in the great vessels. Mediastinum/Nodes: Large left upper lobe mass encases and obscures the hilar structures for the most part. There is undoubtedly left hilar adenopathy but it is difficult to separate from the mass. Multifocal mediastinal adenopathy is seen with subcarinal nodal mass 4.3 x 2.6 cm on 5:74, right paratracheal adenopathy with multiple lymph nodes enlarged for example measuring 2 x 1.9 cm on 5:48, enlarged right hilar nodes measuring as much as 2.4 x 3.1 cm on 5:71, and left axillary adenopathy with the largest of these lymph nodes measuring 2.3 x 2.0 cm on 5:53. The trachea and main bronchi are patent. Unremarkable thoracic esophagus. Lungs/Pleura: Large heterogeneous left upper lobe mass obstructs the left upper lobe bronchus, with no visible remaining aeration in the left upper lobe. Scattered foci of air in the apical portion of the mass could indicate tumoral necrosis or residual air bronchograms versus infectious complication. In total the mass measures 12.7 cm AP on 5:53, 9 cm transverse on 5:44, and 14.6 cm in height. Small layering left and minimal layering right pleural effusions are also again noted as well as multiple right lung nodules consistent with metastases. There is bilateral bronchial thickening but it is proportionately greater in the left lower lobe where there is  peribronchovascular interstitial and airspace opacities which could indicate changes of bronchopneumonia or lymphangitic carcinomatosis. Multiple micronodules are noted in this distribution. Examples of some of the larger right lung metastases include a 1.1 cm nodule in the right upper lobe on 6:46, a right lower lobe superior segment nodule measuring 1.4 cm on 6:51, and a 1.3 cm right lower lobe nodule on 6:119. Others are smaller. There are mild centrilobular and paraseptal emphysematous changes in the right upper lobe. Upper Abdomen: No upper abdominal metastasis is seen. Musculoskeletal: Thoracic spondylosis. Partially visible lower cervical ACDF plating. Multilevel thoracic degenerative disc disease. No destructive bone lesions. Review of the MIP images confirms the above findings. IMPRESSION: 1. Small subsegmental embolic burden in the right lower lobe, with no evidence of right heart strain. 2. Large 12.7 x 9 x 14.6 cm left upper lobe mass with bronchovascular encasement, occluding the left upper lobe bronchus and almost completely occluding the left upper lobe pulmonary artery, and occluding the left superior pulmonary vein. 3. Multiple right lung nodules consistent with metastases. 4. Bilateral bronchial thickening but proportionately greater in the left lower lobe where there is peribronchovascular interstitial and airspace opacities which could indicate changes of bronchopneumonia or lymphangitic carcinomatosis. 5. Small left and minimal right pleural effusions. 6. Multifocal mediastinal, right hilar and left axillary adenopathy. 7. Aortic and coronary artery atherosclerosis. 8. Emphysema. 9. These results will be called to the ordering clinician or representative by the Radiologist Assistant, and communication documented in the PACS or [employer]. Aortic Atherosclerosis (ICD10-I70.0) and Emphysema (ICD10-J43.9). Electronically Signed   By: Almira Bar M.D.   On: 07/29/2023 00:42   MR Lumbar  Spine W Wo Contrast Result Date:  07/28/2023 CLINICAL DATA:  Initial evaluation for acute low back pain EXAM: MRI LUMBAR SPINE WITHOUT AND WITH CONTRAST TECHNIQUE: Multiplanar and multiecho pulse sequences of the lumbar spine were obtained without and with intravenous contrast. CONTRAST:  10mL GADAVIST GADOBUTROL 1 MMOL/ML IV SOLN COMPARISON:  Prior CT from earlier the same day. FINDINGS: Segmentation: Standard. Lowest well-formed disc space labeled the L5-S1 level. Alignment: 2 mm degenerative retrolisthesis of L1 on L2 and L2 on L3, with 3 mm anterolisthesis of L5 on S1. Vertebrae: Abnormal marrow replacement with edema and heterogeneous enhancement seen within the L1 vertebral body, concerning for osseous metastatic disease. Associated mild pathologic compression fracture measures up to 20% without bony retropulsion. No extra osseous or epidural extension of tumor. No other visible metastatic lesions within the lumbar spine. Bone marrow signal intensity otherwise normal. Prominent degenerative reactive endplate changes noted about the L2-3 interspace. No other abnormal marrow edema or enhancement. Conus medullaris and cauda equina: Conus extends to the T12-L1 no abnormal enhancement. Level. Conus and cauda equina appear normal. Paraspinal and other soft tissues: Changes of acute pyelonephritis involving the left greater than right kidneys noted, described on prior CT. 5.3 cm simple right renal cyst, benign in appearance, no follow-up imaging recommended. Disc levels: T12-L1: Mild diffuse disc bulge with reactive endplate spurring. Mild facet hypertrophy. No spinal stenosis. Foramina remain patent. L1-2: Degenerative intervertebral disc space narrowing with diffuse disc bulge and disc desiccation. Reactive endplate spurring. Mild bilateral facet hypertrophy. Resultant mild narrowing of the lateral recesses bilaterally. Central canal remains patent. Foramina remain adequately patent bilaterally. L2-3: Advanced  degenerative intervertebral disc space narrowing with diffuse disc bulge and disc desiccation. Prominent reactive endplate change with marginal endplate osteophytic spurring and marrow edema. Mild to moderate bilateral facet arthrosis. Resultant moderate canal with bilateral subarticular stenosis. Moderate left worse than right L2 foraminal narrowing. L3-4: Diffuse disc bulge with reactive endplate spurring. Superimposed small left foraminal disc protrusion contacts the exiting left L3 nerve root (series 9, image 12). Mild-to-moderate bilateral facet hypertrophy. Resultant mild narrowing of the lateral recesses bilaterally. Central canal remains patent. Mild to moderate left L3 foraminal stenosis. Right neural foramen remains patent. L4-5: Disc desiccation with diffuse disc bulge. Reactive endplate spurring. Moderate to severe bilateral facet arthrosis with associated small reactive joint effusions. Resultant mild to moderate bilateral subarticular stenosis. Central canal remains patent. Moderate to severe bilateral L4 foraminal narrowing. L5-S1: Anterolisthesis with degenerative intervertebral disc space narrowing. Diffuse disc bulge with reactive endplate spurring. Moderate bilateral facet hypertrophy. No significant spinal stenosis. Moderate bilateral L5 foraminal narrowing. IMPRESSION: 1. Abnormal marrow replacement with heterogeneous enhancement within the L1 vertebral body, concerning for osseous metastatic disease. Associated mild pathologic compression fracture measures up to 20% without bony retropulsion. No extra osseous or epidural extension of tumor. 2. No other evidence for metastatic disease within the lumbar spine. 3. Multilevel lumbar spondylosis with resultant moderate canal stenosis at L2-3. Associated multilevel foraminal narrowing as above, moderate to severe in nature at L4-5 bilaterally. 4. Changes of acute pyelonephritis involving the left greater than right kidneys, described on prior CT.  Electronically Signed   By: Rise Mu M.D.   On: 07/28/2023 23:59   MR BRAIN W WO CONTRAST Result Date: 07/28/2023 CLINICAL DATA:  Initial evaluation for brain/CNS neoplasm EXAM: MRI HEAD WITHOUT AND WITH CONTRAST TECHNIQUE: Multiplanar, multiecho pulse sequences of the brain and surrounding structures were obtained without and with intravenous contrast. CONTRAST:  10mL GADAVIST GADOBUTROL 1 MMOL/ML IV SOLN COMPARISON:  Prior CT from  07/28/2023. FINDINGS: Brain: Cerebral volume within normal limits. Patchy T2/FLAIR hyperintensity involving the periventricular and deep white matter both supra hemispheres, consistent with chronic small vessel ischemic disease, moderately advanced in nature. Punctate acute ischemic nonhemorrhagic infarct present at the right parietal cortex (series 9, image 98). No other evidence for acute or subacute ischemia. Mixed cystic and solid peripherally enhancing mass positioned at the left occipital lobe is seen, measuring 4.0 x 3.5 x 3.7 cm. Mild surrounding vasogenic edema without significant regional mass effect or midline shift. 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 No other visible mass lesions or abnormal enhancement. No hydrocephalus or extra-axial fluid collection. Pituitary gland suprasellar region within normal limits. Vascular: Major intracranial vascular flow voids are maintained. Skull and upper cervical spine: Craniocervical junction within normal limits. Visualized bone marrow signal intensity within normal limits. Postoperative changes noted within the partially visualized upper cervical spine. Sinuses/Orbits: Globes and orbital soft tissues within normal limits. Paranasal sinuses are largely clear. No significant mastoid effusion. Other: None. IMPRESSION: 1. 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 Mild surrounding vasogenic edema without significant regional mass effect or midline shift. 2. 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 3. Underlying moderately advanced chronic microvascular ischemic disease. Electronically Signed   By: Rise Mu M.D.   On: 07/28/2023 23:48   CT L-SPINE NO CHARGE Result Date: 07/28/2023 CLINICAL DATA:  Back pain after fall December 1st EXAM: CT LUMBAR SPINE WITHOUT CONTRAST TECHNIQUE: Multidetector CT imaging of the lumbar spine was performed without intravenous contrast administration. Multiplanar CT image reconstructions were also generated. RADIATION DOSE REDUCTION: This exam was performed according to the departmental dose-optimization program which includes automated exposure control, adjustment of the mA and/or kV according to patient size and/or use of iterative reconstruction technique. COMPARISON:  CT 04/05/2016 FINDINGS: Segmentation: 5 lumbar type vertebrae. Alignment: No evidence of traumatic listhesis. Mild grade 1 retrolisthesis of L2 is likely chronic. Vertebrae: Mild superior endplate compression of L1, may be chronic though is age indeterminate without recent comparison. Otherwise no evidence of acute fracture. There is mild heterogeneity of the marrow of L1 and metastasis is not excluded. Consider MRI for further evaluation. Paraspinal and other soft tissues: See separate report for findings in the abdomen and pelvis. Disc levels: Multilevel spondylosis and disc space height loss with degenerative endplate changes greatest at L2-L3 where it is advanced. Mild multilevel facet arthropathy. Spinal canal narrowing is greatest at L2-L3 where it is moderate. IMPRESSION: 1. Mild superior endplate compression of L1 may be chronic though is age indeterminate without recent comparison. 2. Heterogeneity of the marrow of L1. This may be related to chronic changes however metastasis is not excluded.  Consider MRI for further evaluation. \ 3. Advanced disc space height loss at L2-L3. 4. See separate report for findings in the chest, abdomen, and pelvis. Electronically Signed   By: Minerva Fester M.D.   On: 07/28/2023 18:50   CT CHEST ABDOMEN PELVIS W CONTRAST Result Date: 07/28/2023 CLINICAL DATA:  Altered mental status for 1 week and back pain after a fall on December 1st. EXAM: CT CHEST, ABDOMEN, AND PELVIS WITH CONTRAST TECHNIQUE: Multidetector CT imaging of the chest, abdomen and pelvis was performed following the standard protocol during bolus administration of intravenous contrast. RADIATION DOSE REDUCTION: This exam was performed according to the departmental dose-optimization  program which includes automated exposure control, adjustment of the mA and/or kV according to patient size and/or use of iterative reconstruction technique. CONTRAST:  75mL OMNIPAQUE IOHEXOL 350 MG/ML SOLN COMPARISON:  CT abdomen and pelvis 01/14/2008 and chest radiographs 12/07/2014 FINDINGS: CT CHEST FINDINGS Cardiovascular: Normal heart size. Small pericardial effusion. Pericardial thickening where it abuts the large left suprahilar mass. Coronary artery and aortic atherosclerotic calcification. The left upper lobe pulmonary arteries are not well visualized and may be compressed or occluded by the mass. Mediastinum/Nodes: Large heterogenous mass in the left suprahilar region extending into the left upper lobe. Mediastinal, bilateral hilar, and left axillary lymphadenopathy. For example a subcarinal node measures 2.5 cm (series 3/image 27); 1.3 cm right hilar node on series 3/image 34; and a left axillary node measures 2.2 cm (3/19). Trachea and esophagus are unremarkable. Lungs/Pleura: Large heterogenous mass left upper lobe/suprahilar mass consistent with malignancy measuring 12.1 x 8.7 by 12.1 cm. There are a few small locules of gas in the superior portion of the mass which may represent cavitation. The left upper lobe  bronchus is occluded by the mass. Diffuse bronchial wall thickening in the lingula and left lower lobe with centrilobular micro nodules and tree-in-bud opacities. 1.6 cm subpleural nodule in the anterior lingula (series 4/image 90). Small left pleural effusion. Multiple pulmonary nodules in the right lung consistent with metastases. For example 12 mm nodule in the posterior right lower lobe (4/128) and 9 mm nodule in the right upper lobe (4/44). Mild emphysema in the right. No right pleural effusion. No pneumothorax. Musculoskeletal: No acute fracture.  No destructive osseous lesion. CT ABDOMEN PELVIS FINDINGS Hepatobiliary: No focal hepatic lesion. Layering sludge in the gallbladder. No biliary dilation or evidence of cholecystitis. Pancreas: Unremarkable. Spleen: Unremarkable. Adrenals/Urinary Tract: Normal adrenal glands. Heterogenous attenuation of the left kidney. Focal geographic hypoattenuation in the posterior right kidney. Mild asymmetric left-greater-than-right perinephric stranding. Findings are compatible with pyelonephritis. No urinary calculi or hydronephrosis. Unremarkable bladder. Stomach/Bowel: Normal caliber large and small bowel without bowel wall thickening. Stomach and appendix are within normal limits. Colonic diverticulosis without diverticulitis. Vascular/Lymphatic: Aortic atherosclerosis. 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 The stent in the right superficial femoral artery is not definitively opacified and may be occluded. No enlarged abdominal or pelvic lymph nodes. Reproductive: No acute abnormality. Other: No free intraperitoneal fluid or air. Musculoskeletal: No acute fracture or destructive osseous lesion in the pelvis. See separate report for findings in the lumbar spine. IMPRESSION: CHEST: 1. Large heterogenous mass in the left upper lobe/suprahilar region consistent with malignancy. 2.  Diffuse bronchial wall thickening in the lingula and left lower lobe. Differential considerations include lymphangitic spread of tumor or airway infection/inflammation. 3. Multiple pulmonary nodules in the right lung consistent with metastases. 4. Mediastinal, bilateral hilar, and left axillary lymphadenopathy consistent with metastases. 5. Small left pleural effusion. ABDOMEN/PELVIS: 1. Bilateral pyelonephritis, left-greater-than-right. 2. The stent in the right superficial femoral artery is not definitively opacified and may be occluded though this is incompletely evaluated on non arterial phase exam. 3. 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 Aortic Atherosclerosis (ICD10-I70.0) and Emphysema (ICD10-J43.9). Electronically Signed   By: Minerva Fester M.D.   On: 07/28/2023 18:16   CT Head Wo Contrast Result Date: 07/28/2023 CLINICAL DATA:  Delirium EXAM: CT HEAD WITHOUT CONTRAST TECHNIQUE: Contiguous axial images were obtained  from the base of the skull through the vertex without intravenous contrast. RADIATION DOSE REDUCTION: This exam was performed according to the departmental dose-optimization program which includes automated exposure control, adjustment of the mA and/or kV according to patient size and/or use of iterative reconstruction technique. COMPARISON:  CT of the head December 07, 2014. FINDINGS: Brain: Complex cystic and solid 3.4 cm mass in the left parietal lobe. This lesion has some mild hyperdensity associated with it which could represent mineralization and or hemorrhage. Surrounding edema and mild regional mass effect without midline shift. No evidence of acute large vascular territory infarct, mass occupying acute hemorrhage outside the above lesion, midline shift or hydrocephalus. Vascular: No hyperdense vessel identified. Skull: Normal. Negative for fracture or focal lesion. Sinuses/Orbits: Mostly  clear sinuses.  No acute orbital findings. IMPRESSION: 1. Cystic and solid 3.4 cm mass in the left parietal lobe, which is concerning for a metastasis given the findings on the forthcoming CT of the chest/abdomen/pelvis. Recommend MRI of the head with contrast to further characterize and to assess for other lesions. 2. Surrounding edema and mild regional mass effect without midline shift. Electronically Signed   By: Feliberto Harts M.D.   On: 07/28/2023 18:02      IMPRESSION/PLAN: 1. 62 y.o. 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  If non-small cell lung cancer is confirmed, he'll likely have at least oligometastatic stage IV disease.  He may benefit from fractionated stereotactic radiotherapy to the brain met with or without surgical resection.  He may also benefit from a short course of palliative radiation to the left upper lung mass to palliate chest wall pain and preserve the patency of the left lower lung bronchus.  However, his tenuous medical and psychiatric condition may create obstacles to optimal oncologic care.  It is not clear whether his current delirium is related to hospitalization, steroid psychosis or other issue.  Ideally, his cognition will improve to participate in shared decision making about diagnostic and therapeutic options.  He certainly has effective cancer treatment options if that is what he wishes to pursue.  I personally spent 60 minutes in this encounter including chart review, reviewing radiological studies, meeting face-to-face with the patient, entering orders and completing documentation.    Marguarite Arbour, PA-C    Margaretmary Dys, MD  [facility]  Radiation Oncology Direct Dial: [phone]  Fax: [phone] [email or web address]  Skype  LinkedIn

## 2023-07-31 NOTE — Progress Notes (Signed)
PHARMACY - ANTICOAGULATION CONSULT NOTE  Pharmacy Consult for heparin Indication:  new onset atrial fibrillation and new subsegmental pulmonary embolism  Allergies  Allergen Reactions   Ceftriaxone Itching, Nausea And Vomiting and Rash    Noted minutes after starting medication No rash noted with Ancef on 01-31-2022 during general anesthesia Has received cefepime and cefazolin without reaction    Patient Measurements: Height: 6\' 3"  (190.5 cm) Weight: 77.7 kg (171 lb 4.8 oz) IBW/kg (Calculated) : 84.5 Heparin Dosing Weight: 77 kg  Vital Signs: Temp: 98.2 F (36.8 C) (12/17 2303) Temp Source: Oral (12/17 2303) BP: 131/73 (12/18 0200) Pulse Rate: 109 (12/18 0200)  Labs: Recent Labs    07/28/23 1548 07/28/23 1756 07/29/23 0422 07/29/23 1737 07/30/23 0114 07/30/23 0422 07/30/23 2326 07/31/23 0233  HGB 13.0  --  11.3*  --   --  11.1*  --  12.7*  HCT 40.9  --  35.7*  --   --  35.5*  --  40.0  PLT 515*  --  391  --   --  448*  --  499*  HEPARINUNFRC  --   --   --  <0.10* <0.10*  --   --  <0.10*  CREATININE 1.01  --  1.19  --   --  0.90 0.91 0.90  TROPONINIHS 21* 24*  --   --   --   --   --   --     Estimated Creatinine Clearance: 91.1 mL/min (by C-G formula based on SCr of 0.9 mg/dL).   Medical History: Past Medical History:  Diagnosis Date   Arthritis    COVID    has had Covid 3 times, not sick with it   COVID    has had 3 times mild to no symptoms   Depression    Diabetes mellitus without complication (HCC)    pt states he has now been told that he is a type 1 diabetic.   Dyspnea    albuterol inhaler   HLD (hyperlipidemia)    Hypertension    Peripheral vascular disease (HCC)     Medications:  Medications Prior to Admission  Medication Sig Dispense Refill Last Dose/Taking   acetaminophen (TYLENOL) 500 MG tablet Take 2 tablets (1,000 mg total) by mouth every 6 (six) hours as needed for mild pain or fever (For the first 3 days, may take 2 tabs (1000 mg  total) 4 times daily scheduled following which can take 2 tabs (1000 mg total) 4 times daily as needed.).   Past Week   atorvastatin (LIPITOR) 10 MG tablet Take 10 mg by mouth daily.   Past Week   gabapentin (NEURONTIN) 600 MG tablet Take 1 tablet (600 mg total) by mouth 3 (three) times daily. 90 tablet 3 Past Week   pioglitazone (ACTOS) 15 MG tablet Take 15 mg by mouth daily.   Taking   insulin aspart protamine - aspart (NOVOLOG 70/30 MIX) (70-30) 100 UNIT/ML FlexPen Inject 10 Units into the skin 2 (two) times daily with a meal. (Patient not taking: Reported on 07/29/2023) 15 mL 0 Not Taking   Insulin Pen Needle 32G X 4 MM MISC Use as directed two times daily. 100 each 0    methocarbamol (ROBAXIN) 500 MG tablet Take 1 tablet (500 mg total) by mouth every 6 (six) hours as needed for muscle spasms. (Patient not taking: Reported on 07/29/2023) 30 tablet 0 Not Taking   Multiple Vitamin (MULTIVITAMIN WITH MINERALS) TABS tablet Take 1 tablet by mouth daily. (Patient  not taking: Reported on 07/29/2023)   Not Taking   nicotine (NICODERM CQ - DOSED IN MG/24 HOURS) 14 mg/24hr patch Place 1 patch (14 mg total) onto the skin daily. (Patient not taking: Reported on 07/29/2023) 28 patch 0 Not Taking   oxycodone (OXY-IR) 5 MG capsule Take 1 capsule (5 mg total) by mouth every 6 (six) hours as needed. (Patient not taking: Reported on 07/29/2023) 28 capsule 0 Not Taking   Oxycodone HCl 10 MG TABS Take 0.5 tablets (5 mg total) by mouth every 4 (four) hours as needed. (Patient not taking: Reported on 07/29/2023) 42 tablet 0 Not Taking   Scheduled:   aspirin EC  81 mg Oral Daily   atorvastatin  10 mg Oral QPM   dexamethasone (DECADRON) injection  4 mg Intravenous QHS   digoxin  0.25 mg Oral Daily   diltiazem  30 mg Oral Q8H   insulin aspart  0-20 Units Subcutaneous TID PC & HS   insulin glargine-yfgn  10 Units Subcutaneous Daily   metoprolol tartrate  10 mg Intravenous Q6H   Infusions:   sodium chloride 100  mL/hr at 07/30/23 2321   ceFEPime (MAXIPIME) IV 2 g (07/31/23 0152)   heparin 1,500 Units/hr (07/30/23 2018)    Assessment: 62 YOM admitted for sepsis and found to have new onset atrial fibrillation and new subsegmental pulmonary embolism. 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 After discussion with hospitalist MD and neurology MD, will proceed with heparin initation at this time for treatment of PE. Pharmacy has been consulted to assist with dosing.   Third-shift update: heparin level undetectable with heparin running at 1,500 units/hour. Heparin has been running since ~2000 12/17. No issues with heparin infusion or bleeding noted.   Goal of Therapy:  Heparin level 0.3-0.5 units/ml - narrow goal due to patient at very high bleeding risk Monitor platelets by anticoagulation protocol: Yes   Plan:  Increase heparin drip to 1700 units/hr Check heparin level in 6 hours after resuming heparin  Monitor for signs/symptoms of bleeding F/U oral anticoagulation plans   Jani Gravel, PharmD Clinical Pharmacist  07/31/2023 3:52 AM

## 2023-07-31 NOTE — Progress Notes (Incomplete)
   Large  left upper lobe mass with broncho vascular encasement, occluding the left upper lobe bronchus and almost completely occluding the left upper lobe pulmonary artery, and occluding the left superior pulmonary vein. 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 Oncology referral per Primary Team Will most likely need radiation oncology in near future  to treat left upper lobe  bronchus  Consider evaluation of  small left pleural effusion for possibility of thoracentesis and fluid to evaluate for cytology.  Titrate oxygen to maintain sats of > 90%

## 2023-08-01 ENCOUNTER — Inpatient Hospital Stay (HOSPITAL_COMMUNITY): Payer: Medicaid Other

## 2023-08-01 ENCOUNTER — Encounter (HOSPITAL_COMMUNITY): Payer: Self-pay | Admitting: Pulmonary Disease

## 2023-08-01 DIAGNOSIS — G9341 Metabolic encephalopathy: Secondary | ICD-10-CM | POA: Diagnosis not present

## 2023-08-01 DIAGNOSIS — R918 Other nonspecific abnormal finding of lung field: Secondary | ICD-10-CM | POA: Diagnosis not present

## 2023-08-01 DIAGNOSIS — R0609 Other forms of dyspnea: Secondary | ICD-10-CM

## 2023-08-01 DIAGNOSIS — I4891 Unspecified atrial fibrillation: Secondary | ICD-10-CM | POA: Diagnosis not present

## 2023-08-01 DIAGNOSIS — A419 Sepsis, unspecified organism: Secondary | ICD-10-CM | POA: Diagnosis not present

## 2023-08-01 DIAGNOSIS — R6520 Severe sepsis without septic shock: Secondary | ICD-10-CM | POA: Diagnosis not present

## 2023-08-01 DIAGNOSIS — Z515 Encounter for palliative care: Secondary | ICD-10-CM | POA: Diagnosis not present

## 2023-08-01 DIAGNOSIS — I5021 Acute systolic (congestive) heart failure: Secondary | ICD-10-CM

## 2023-08-01 DIAGNOSIS — Z7189 Other specified counseling: Secondary | ICD-10-CM | POA: Diagnosis not present

## 2023-08-01 DIAGNOSIS — N12 Tubulo-interstitial nephritis, not specified as acute or chronic: Secondary | ICD-10-CM | POA: Diagnosis not present

## 2023-08-01 LAB — GLUCOSE, CAPILLARY
Glucose-Capillary: 186 mg/dL — ABNORMAL HIGH (ref 70–99)
Glucose-Capillary: 224 mg/dL — ABNORMAL HIGH (ref 70–99)
Glucose-Capillary: 207 mg/dL — ABNORMAL HIGH (ref 70–99)
Glucose-Capillary: 204 mg/dL — ABNORMAL HIGH (ref 70–99)

## 2023-08-01 LAB — COMPREHENSIVE METABOLIC PANEL
ALT: 11 U/L (ref 0–44)
AST: 14 U/L — ABNORMAL LOW (ref 15–41)
Albumin: 2 g/dL — ABNORMAL LOW (ref 3.5–5.0)
Alkaline Phosphatase: 74 U/L (ref 38–126)
Anion gap: 10 (ref 5–15)
BUN: 31 mg/dL — ABNORMAL HIGH (ref 8–23)
CO2: 22 mmol/L (ref 22–32)
Calcium: 10 mg/dL (ref 8.9–10.3)
Chloride: 103 mmol/L (ref 98–111)
Creatinine, Ser: 0.69 mg/dL (ref 0.61–1.24)
GFR, Estimated: >60 mL/min (ref >60)
Glucose, Bld: 157 mg/dL — ABNORMAL HIGH (ref 70–99)
Potassium: 4.7 mmol/L (ref 3.5–5.1)
Sodium: 135 mmol/L (ref 135–145)
Total Bilirubin: 0.6 mg/dL (ref <1.2)
Total Protein: 5.8 g/dL — ABNORMAL LOW (ref 6.5–8.1)

## 2023-08-01 LAB — CBC WITH DIFFERENTIAL/PLATELET
Abs Immature Granulocytes: 0.12 10*3/uL — ABNORMAL HIGH (ref 0.00–0.07)
Basophils Absolute: 0.1 10*3/uL (ref 0.0–0.1)
Basophils Relative: 0 %
Eosinophils Absolute: 0 10*3/uL (ref 0.0–0.5)
Eosinophils Relative: 0 %
HCT: 38.6 % — ABNORMAL LOW (ref 39.0–52.0)
Hemoglobin: 12.3 g/dL — ABNORMAL LOW (ref 13.0–17.0)
Immature Granulocytes: 1 %
Lymphocytes Relative: 3 %
Lymphs Abs: 0.5 10*3/uL — ABNORMAL LOW (ref 0.7–4.0)
MCH: 26.3 pg (ref 26.0–34.0)
MCHC: 31.9 g/dL (ref 30.0–36.0)
MCV: 82.7 fL (ref 80.0–100.0)
Monocytes Absolute: 0.6 10*3/uL (ref 0.1–1.0)
Monocytes Relative: 4 %
Neutro Abs: 15.3 10*3/uL — ABNORMAL HIGH (ref 1.7–7.7)
Neutrophils Relative %: 92 %
Platelets: 311 10*3/uL (ref 150–400)
RBC: 4.67 MIL/uL (ref 4.22–5.81)
RDW: 14.8 % (ref 11.5–15.5)
WBC: 16.6 10*3/uL — ABNORMAL HIGH (ref 4.0–10.5)
nRBC: 0 % (ref 0.0–0.2)

## 2023-08-01 LAB — BRAIN NATRIURETIC PEPTIDE: B Natriuretic Peptide: 117.6 pg/mL — ABNORMAL HIGH (ref 0.0–100.0)

## 2023-08-01 LAB — C-REACTIVE PROTEIN: CRP: 11.3 mg/dL — ABNORMAL HIGH (ref <1.0)

## 2023-08-01 LAB — ECHOCARDIOGRAM LIMITED
Height: 75 in
Weight: 2740.76 [oz_av]

## 2023-08-01 LAB — PROCALCITONIN: Procalcitonin: <0.1 ng/mL

## 2023-08-01 LAB — MAGNESIUM: Magnesium: 2.1 mg/dL (ref 1.7–2.4)

## 2023-08-01 LAB — PHOSPHORUS: Phosphorus: 2.2 mg/dL — ABNORMAL LOW (ref 2.5–4.6)

## 2023-08-01 MED ORDER — SODIUM ZIRCONIUM CYCLOSILICATE 5 G PO PACK
5.0000 g | PACK | Freq: Once | ORAL | Status: AC
  Administered 2023-08-01: 5 g via ORAL
  Filled 2023-08-01: qty 1

## 2023-08-01 MED ORDER — SODIUM CHLORIDE 0.9 % IV SOLN: INTRAVENOUS | Status: DC

## 2023-08-01 MED ORDER — PERFLUTREN LIPID MICROSPHERE
1.0000 mL | INTRAVENOUS | Status: AC | PRN
Start: 2023-08-01 — End: 2023-08-01
  Administered 2023-08-01: 3 mL via INTRAVENOUS

## 2023-08-01 MED ORDER — NICOTINE 14 MG/24HR TD PT24
14.0000 mg | MEDICATED_PATCH | Freq: Every day | TRANSDERMAL | Status: DC
  Administered 2023-08-01 – 2023-08-05 (×3): 14 mg via TRANSDERMAL
  Filled 2023-08-01 (×4): qty 1

## 2023-08-01 NOTE — Progress Notes (Signed)
Rounding Note    Patient Name: Cody Pena Date of Encounter: 08/01/2023  [facility] Cardiologist:   Subjective   Pt laying in bed  Not as calm as yesterday   Mittens on   Inpatient Medications    Scheduled Meds:  apixaban  5 mg Oral BID   aspirin EC  81 mg Oral Daily   atorvastatin  10 mg Oral QPM   dexamethasone (DECADRON) injection  4 mg Intravenous QHS   digoxin  0.25 mg Oral Daily   insulin aspart  0-20 Units Subcutaneous TID PC & HS   insulin glargine-yfgn  10 Units Subcutaneous Daily   metoprolol tartrate  75 mg Oral BID   QUEtiapine  25 mg Oral BID   sodium zirconium cyclosilicate  5 g Oral Once   Continuous Infusions:  sodium chloride     ceFEPime (MAXIPIME) IV 2 g (08/01/23 0320)   PRN Meds: diltiazem, haloperidol lactate, HYDROcodone-acetaminophen, morphine   Vital Signs    Vitals:   08/01/23 0200 08/01/23 0400 08/01/23 0600 08/01/23 0812  BP: 117/74 130/82 122/78 133/76  Pulse: 75 81 88 96  Resp: 15 (!) 28 15 18   Temp:  97.6 F (36.4 C)  97.7 F (36.5 C)  TempSrc:  Oral  Oral  SpO2: 96% 95% 96% 93%  Weight:      Height:       No intake or output data in the 24 hours ending 08/01/23 0857     07/30/2023    9:25 AM 07/29/2023    2:00 AM 07/28/2023    3:18 PM  Last 3 Weights  Weight (lbs) 171 lb 4.8 oz 171 lb 4.8 oz 214 lb  Weight (kg) 77.7 kg 77.7 kg 97.07 kg      Telemetry    Tele with Afib with RVR   He has since converted to SR /ST  (around 1 AM)  - Personally Reviewed  ECG    No new  - Personally Reviewed  Physical Exam   GEN: Sl agitated, mittens on  Neck:  Difficult to assess JVP with beard  Cardiac: RRR  No murmurs   Respiratory: Clear to auscultation anteriorly   GI: Soft, nontender, non-distended  MS: No edema; s/p R BKA    Labs    High Sensitivity Troponin:   Recent Labs  Lab 07/28/23 1548 07/28/23 1756  TROPONINIHS 21* 24*     Chemistry Recent Labs  Lab 07/30/23 0422 07/30/23 2326  07/31/23 0233 08/01/23 0420  NA 131* 136 136 135  K 4.3 4.8 4.3 4.7  CL 101 101 100 103  CO2 22 21* 23 22  GLUCOSE 236* 153* 154* 157*  BUN 32* 32* 30* 31*  CREATININE 0.90 0.91 0.90 0.69  CALCIUM 11.9* 12.1* 11.7* 10.0  MG 2.0 1.9 2.0 2.1  PROT 6.0*  --  6.4* 5.8*  ALBUMIN 2.0*  --  2.4* 2.0*  AST 19  --  19 14*  ALT 13  --  12 11  ALKPHOS 82  --  80 74  BILITOT 0.6  --  0.6 0.6  GFRNONAA >60 >60 >60 >60  ANIONGAP 8 14 13 10     Lipids No results for input(s): "CHOL", "TRIG", "HDL", "LABVLDL", "LDLCALC", "CHOLHDL" in the last 168 hours.  Hematology Recent Labs  Lab 07/30/23 0422 07/31/23 0233 08/01/23 0420  WBC 18.2* 19.6* 16.6*  RBC 4.20* 4.78 4.67  HGB 11.1* 12.7* 12.3*  HCT 35.5* 40.0 38.6*  MCV 84.5 83.7 82.7  MCH 26.4 26.6 26.3  MCHC 31.3 31.8 31.9  RDW 14.5 14.6 14.8  PLT 448* 499* 311   Thyroid  Recent Labs  Lab 07/29/23 0422  TSH 1.533    BNP Recent Labs  Lab 07/30/23 2326 07/31/23 0233 08/01/23 0420  BNP 516.6* 219.9* 117.6*    DDimer No results for input(s): "DDIMER" in the last 168 hours.   Radiology    No results found.   Cardiac Studies   Echo   07/29/23   1. Left ventricular ejection fraction, by estimation, is 30 to 35%. The  left ventricle has moderately decreased function. The left ventricle  demonstrates global hypokinesis. The left ventricular internal cavity size  was mildly dilated. Left ventricular  diastolic parameters are indeterminate.   2. Right ventricular systolic function is normal. The right ventricular  size is normal. There is normal pulmonary artery systolic pressure. The  estimated right ventricular systolic pressure is 25.8 mmHg.   3. The mitral valve is normal in structure. Trivial mitral valve  regurgitation. No evidence of mitral stenosis.   4. The aortic valve is tricuspid. There is mild calcification of the  aortic valve. Aortic valve regurgitation is trivial. No aortic stenosis is  present.   5. The  inferior vena cava is dilated in size with <50% respiratory  variability, suggesting right atrial pressure of 15 mmHg.   6. The patient was in atrial fibrillation.    Patient Profile     Cody Pena is a 62 y.o. 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 Thedore Mins.   Assessment & Plan    1  Atrial fibrillation    Pt in afib with RVR earlier this week  Has been in SR since yesterday   2  HFrEF   I have reviewed echo images  Extremely poor acoustic windows    LVEF may not be quite as low as estimated  Limited echo with Definity ordered   3 Pulmonary  LUL mass   4  Neuro  R parietal lobe  Felt embolic  5  PE   RLL PE   On Eliquis    6  HTN    BP is OK  7  Hypercalcemia   Ca improved      8  HL  Continue LIpitor     8   DM   A1C 9.9    For questions or updates, please contact [facility] Please consult [email or web address] for contact info under        Signed, Dietrich Pates, MD  08/01/2023, 8:57 AM

## 2023-08-01 NOTE — Plan of Care (Signed)
  Problem: Education: Goal: Ability to describe self-care measures that may prevent or decrease complications (Diabetes Survival Skills Education) will improve Outcome: Progressing Goal: Individualized Educational Video(s) Outcome: Progressing   Problem: Coping: Goal: Ability to adjust to condition or change in health will improve Outcome: Progressing   Problem: Fluid Volume: Goal: Ability to maintain a balanced intake and output will improve Outcome: Progressing   Problem: Health Behavior/Discharge Planning: Goal: Ability to identify and utilize available resources and services will improve Outcome: Progressing Goal: Ability to manage health-related needs will improve Outcome: Progressing   Problem: Metabolic: Goal: Ability to maintain appropriate glucose levels will improve Outcome: Progressing   Problem: Nutritional: Goal: Maintenance of adequate nutrition will improve Outcome: Progressing Goal: Progress toward achieving an optimal weight will improve Outcome: Progressing   Problem: Skin Integrity: Goal: Risk for impaired skin integrity will decrease Outcome: Progressing   Problem: Tissue Perfusion: Goal: Adequacy of tissue perfusion will improve Outcome: Progressing   Problem: Education: Goal: Knowledge of General Education information will improve Description: Including pain rating scale, medication(s)/side effects and non-pharmacologic comfort measures Outcome: Progressing   Problem: Health Behavior/Discharge Planning: Goal: Ability to manage health-related needs will improve Outcome: Progressing   Problem: Clinical Measurements: Goal: Ability to maintain clinical measurements within normal limits will improve Outcome: Progressing Goal: Will remain free from infection Outcome: Progressing Goal: Diagnostic test results will improve Outcome: Progressing Goal: Respiratory complications will improve Outcome: Progressing Goal: Cardiovascular complication will  be avoided Outcome: Progressing   Problem: Activity: Goal: Risk for activity intolerance will decrease Outcome: Progressing   Problem: Nutrition: Goal: Adequate nutrition will be maintained Outcome: Progressing   Problem: Coping: Goal: Level of anxiety will decrease Outcome: Progressing   Problem: Elimination: Goal: Will not experience complications related to bowel motility Outcome: Progressing Goal: Will not experience complications related to urinary retention Outcome: Progressing   Problem: Pain Management: Goal: General experience of comfort will improve Outcome: Progressing   Problem: Safety: Goal: Ability to remain free from injury will improve Outcome: Progressing   Problem: Skin Integrity: Goal: Risk for impaired skin integrity will decrease Outcome: Progressing   Problem: Safety: Goal: Non-violent Restraint(s) Outcome: Progressing

## 2023-08-01 NOTE — Progress Notes (Signed)
  Echocardiogram 2D Echocardiogram has been performed.  Leda Roys RDCS 08/01/2023, 3:15 PM

## 2023-08-01 NOTE — Progress Notes (Signed)
Cytology and pathology results still pending form 12/17. Will cont to follow.  JD Anselm Lis [facility] 08/01/2023, 11:50 AM  Please see [email or web address] for pager details.  From 7A-7P if no response, please call [phone]. After hours, please call ELink [phone].

## 2023-08-01 NOTE — Progress Notes (Signed)
Daily Progress Note   Patient Name: Cody Pena       Date: 08/01/2023 DOB: Sep 15, 1960  Age: 63 y.o. MRN#: [id number] Attending Physician: Leroy Sea, MD Primary Care Physician: [facility] Admit Date: 07/28/2023 Length of Stay: 4 days  Reason for Consultation/Follow-up: Establishing goals of care  HPI/Patient Profile:  62 y.o. male  with past medical history of peripheral vascular disease s/p BKA, hypertension, insulin-dependent type 2 diabetes, and hyperlipidemia who presents with altered mental status and back pain. 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    PMT was consulted for GOC conversations.  Subjective:   Subjective: Chart Reviewed. Updates received. Patient Assessed. Created space and opportunity for patient  and family to explore thoughts and feelings regarding current medical situation.  Today's Discussion: Today saw the patient at the bedside, echocardiogram is at bedside about to start his echo.  Patient is awake, alert, conversing.  He asked for water for which you can take that she would asked the nurse when she is completed her study.  He denies pain, nausea, vomiting.  He understands that they are checking his heart function right now.  He understands that they found a mass in his lung and they did a biopsy by bronchoscopy and are awaiting results.  He understands there is not much to decide until we have results and oncology and radiation oncology is awaiting.  I explained the role of palliative medicine to continue to support him and help with conversations as results become available.  I confirmed that it is okay to speak to his wife and daughter about his health.  I shared that palliative medicine would shadow his chart for results and  follow-up when they are back, possibly tomorrow or Saturday.  I provided emotional and general support through therapeutic listening, empathy, sharing of stories, therapeutic touch, and other techniques. I answered all questions and addressed all concerns to the best of my ability.  Review of Systems  Constitutional:        Denies pain in general  Respiratory:  Negative for shortness of breath.   Cardiovascular:  Negative for chest pain.  Gastrointestinal:  Negative for abdominal pain, nausea and vomiting.    Objective:   Vital Signs:  BP (!) 124/59 (BP Location: Right Arm)   Pulse 86   Temp 97.8 F (36.6 C) (Oral)   Resp 20   Ht 6\' 3"  (1.905 m)   Wt 77.7 kg   SpO2 93%   BMI 21.41 kg/m   Physical Exam Vitals and nursing note reviewed.  Constitutional:      General: He is not in acute distress.    Appearance: He is ill-appearing.  HENT:     Head: Normocephalic and atraumatic.  Cardiovascular:     Rate and Rhythm: Normal rate.  Pulmonary:     Effort: Pulmonary effort is normal. No respiratory distress.  Abdominal:     General: Abdomen is flat.     Palpations: Abdomen is soft.  Skin:    General: Skin is warm and dry.  Neurological:     General: No focal deficit present.     Mental Status: He is alert.  Psychiatric:        Mood and Affect: Mood normal.        Behavior: Behavior normal.  Palliative Assessment/Data: 30%    Existing Vynca/ACP Documentation: None  Assessment & Plan:   Impression: Present on Admission:  HTN (hypertension)  Hyperlipidemia  Sepsis (HCC)  Lung mass  62 year old male with acute presentation chronic comorbidities as described above. The patient unfortunately has a slew of new diagnoses, mostly unrelated. He has sepsis secondary to bilateral pyelonephritis with no obstruction or hydronephrosis note, currently being treated with antibiotics and fluids. He was also unfortunately found to have a large lung mass suspicious for  primary malignancy with apparent mets to the brain, spine, lymph nodes. Bronc was completed and pathology is pending. There is apparent associated vasogenic edema due to the brain mets. He has significant altered mental status related to this as well. He also has had a CVA without apparent deficit at this time and an acute PE for which he is currently on heparin.  The patient is now cleared for [place] to speak to his wife and daughter.  He understands he has a lung mass and we are awaiting results to decide what options, if any, are available to him.  Overall prognosis guarded to poor.   SUMMARY OF RECOMMENDATIONS   Remain full code Full scope of care Await biopsy results Further GOC discussions when results are back Palliative medicine will shadow the chart for results and follow-up in person when they have come back  Symptom Management:  Per primary team PMT is available to assist as needed  Code Status: Full code  Prognosis: Unable to determine  Discharge Planning: To Be Determined  Discussed with: Patient, medical team, nursing team  Thank you for allowing [place] to participate in the care of MELQUAN VENABLES PMT will continue to support holistically.  Time Total: 30 min  Detailed review of medical records (labs, imaging, vital signs), medically appropriate exam, discussed with treatment team, counseling and education to patient, family, & staff, documenting clinical information, medication management, coordination of care  Wynne Dust, NP Palliative Medicine Team  Team Phone # [phone] (Nights/Weekends)  04/11/2021, 8:17 AM

## 2023-08-01 NOTE — Progress Notes (Signed)
Physical Therapy Treatment Patient Details Name: Cody Pena MRN: [id number] DOB: [birth year] Today's Date: 08/01/2023   History of Present Illness 62 y.o. male who presents with AMS x1 week and back pain, new diagnosis of cancer, anuerysm, past CVA, brain mass, LUL mass, and PE. Pt had recent fall 12/1.   PMHx:  Covid, anxiety/depression, uncontrolled DM, HTN, PVD, cervical spine fusion, peripheral neuropathy, OA, R BKA    PT Comments  Pt tolerated treatment well today. Pt today was able to stand EOB with +2 Min A RW for ~5 minutes while nursing staff changed soiled linen and performed pericare. After standing for 5 minutes pt reported significant dizziness and requested to sit down. No change in DC/DME recs at this time. PT will continue to follow.     If plan is discharge home, recommend the following: Two people to help with walking and/or transfers;A lot of help with bathing/dressing/bathroom;Assistance with feeding;Direct supervision/assist for medications management;Direct supervision/assist for financial management   Can travel by private vehicle     No  Equipment Recommendations  Other (comment) (Per accepting facility)    Recommendations for Other Services       Precautions / Restrictions Precautions Precautions: Fall Precaution Comments: R BKA 2023 Restrictions Weight Bearing Restrictions Per Provider Order: No     Mobility  Bed Mobility Overal bed mobility: Needs Assistance Bed Mobility: Supine to Sit, Sit to Supine     Supine to sit: Min assist Sit to supine: Min assist   General bed mobility comments: min A with verbal cueing for task initiation, sequencing. Patient reports significant dizziness with sitting up on edge of bed. Requiring hands on assist and use of bed rails    Transfers Overall transfer level: Needs assistance Equipment used: Rolling walker (2 wheels) Transfers: Sit to/from Stand Sit to Stand: +2 physical assistance, Min assist            General transfer comment: Pt able to stand ~5 minutes in order for nursing staff to change bed linen. Pt reported significant dizziness and requested to sit down after 5 minutes.    Ambulation/Gait               General Gait Details: unable to attempt   Stairs             Wheelchair Mobility     Tilt Bed    Modified Rankin (Stroke Patients Only)       Balance Overall balance assessment: Needs assistance Sitting-balance support: Feet supported, Bilateral upper extremity supported Sitting balance-[name]: Fair     Standing balance support: Bilateral upper extremity supported, During functional activity Standing balance-[name]: Poor Standing balance comment: reliant on RW                            Cognition Arousal: Alert Behavior During Therapy: WFL for tasks assessed/performed Overall Cognitive Status: Impaired/Different from baseline Area of Impairment: Orientation, Following commands, Awareness, Problem solving                 Orientation Level: Disoriented to, Time, Situation     Following Commands: Follows one step commands with increased time, Follows one step commands inconsistently   Awareness: Intellectual Problem Solving: Slow processing, Difficulty sequencing, Requires verbal cues, Requires tactile cues General Comments: Pt oriented to self however does not know time or location without cues. Slow processing and confusion noted during session.        Exercises  General Comments General comments (skin integrity, edema, etc.): VSS on RA      Pertinent Vitals/Pain Pain Assessment Pain Assessment: No/denies pain    Home Living                          Prior Function            PT Goals (current goals can now be found in the care plan section) Progress towards PT goals: Progressing toward goals    Frequency    Min 1X/week      PT Plan      Co-evaluation               AM-PAC PT "6 Clicks" Mobility   Outcome Measure  Help needed turning from your back to your side while in a flat bed without using bedrails?: A Little Help needed moving from lying on your back to sitting on the side of a flat bed without using bedrails?: A Lot Help needed moving to and from a bed to a chair (including a wheelchair)?: Total Help needed standing up from a chair using your arms (e.g., wheelchair or bedside chair)?: Total Help needed to walk in hospital room?: Total Help needed climbing 3-5 steps with a railing? : Total 6 Click Score: 9    End of Session   Activity Tolerance: Patient tolerated treatment well Patient left: in bed;with call bell/phone within reach;Other (comment);with nursing/sitter in room (Handoff to nursing) Nurse Communication: Mobility status PT Visit Diagnosis: Muscle weakness (generalized) (M62.81);Other abnormalities of gait and mobility (R26.89);History of falling (Z91.81);Dizziness and giddiness (R42)     Time: 8657-8469 PT Time Calculation (min) (ACUTE ONLY): 18 min  Charges:    $Therapeutic Activity: 8-22 mins PT General Charges $$ ACUTE PT VISIT: 1 Visit                     Shela Nevin, PT, DPT Acute Rehab Services [phone]    Cody Pena 08/01/2023, 3:25 PM

## 2023-08-01 NOTE — Progress Notes (Addendum)
Cody Pena   DOB:Nov 03, 1960   VH#:[id number]      ASSESSMENT & PLAN:  1.  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  Differential diagnosis of non-small cell lung cancer vs small cell lung cancer. -LUL mass biopsy done 07/30/23. Path is pending, will follow-up results -CT of head and MRI of brain both concerning for mets -Radiation oncology consulted, consider radiation to the brain and to the left chest/lumbar spine. -Treatment recommendations will be forthcoming when obtain path results. -Medical oncology/Dr. Truett Perna following closely.  Recommend outpatient follow-up in the cancer center.    2.  Acute PE -Per CT angio -Patient status post IV heparin.  Transitioned to Eliquis, tolerating well. -Patient is not a candidate for IVC filter -Monitor closely for active bleeding  3.  Hypercalcemia of malignancy -Patient is status post Zometa 4 mg x 1 on 07/29/2023.  Can repeat this in 7 days if calcium levels remain elevated.   -However noted decrease in calcium from 12.3-10.0 today.  No repeat dosage of Zometa needed at this time.   -Continue IV hydration -Continue to monitor CMP.  4.  Leukocytosis/sepsis -Likely secondary to pyelonephritis, steroids -WBC with slight decrease although remains elevated at 16 today -Likely due to infectious process -Continue antibiotics as ordered -Monitor fever curve  5.  Encephalopathy/altered mental status -EEG done and was suggestive of moderate diffuse encephalopathy -Neuro following  Code Status Full  Goals of care: Incurable.     Subjective:  Patient seen awake and alert laying in bed, reports he feels okay.  However appears mildly confused, mumbling speech although when asked to repeat his words speech is understood.   Reports that he still has some left sided upper extremity and anterior chest wall pain.  No other acute distress noted.  Objective:  Vitals:   08/01/23 0812 08/01/23 1149  BP: 133/76 (!) 124/59  Pulse:  96 86  Resp: 18 20  Temp: 97.7 F (36.5 C) 97.8 F (36.6 C)  SpO2: 93%     No intake or output data in the 24 hours ending 08/01/23 1423   REVIEW OF SYSTEMS:   Constitutional: + Fatigue, denies fevers, chills or abnormal night sweats Eyes: Denies blurriness of vision, double vision or watery eyes Ears, nose, mouth, throat, and face: Denies mucositis or sore throat Respiratory: Denies cough, dyspnea or wheezes Cardiovascular: Denies palpitation, chest discomfort or lower extremity swelling +R BKA Gastrointestinal:  Denies nausea, heartburn or change in bowel habits Skin: Denies abnormal skin rashes Lymphatics: Denies new lymphadenopathy or easy bruising Neurological: Denies numbness, tingling or new weaknesses Behavioral/Psych: + Slight confusion noted All other systems were reviewed with the patient and are negative.  PHYSICAL EXAMINATION: ECOG PERFORMANCE STATUS: 3 - Symptomatic, >50% confined to bed  Vitals:   08/01/23 0812 08/01/23 1149  BP: 133/76 (!) 124/59  Pulse: 96 86  Resp: 18 20  Temp: 97.7 F (36.5 C) 97.8 F (36.6 C)  SpO2: 93%    Filed Weights   07/28/23 1518 07/29/23 0200 07/30/23 0925  Weight: 214 lb (97.1 kg) 171 lb 4.8 oz (77.7 kg) 171 lb 4.8 oz (77.7 kg)    GENERAL: alert, no distress and comfortable +cachectic + ill appearing SKIN: + Pale skin color, texture, turgor are normal, no rashes or significant lesions EYES: normal, conjunctiva are pink and non-injected, sclera clear OROPHARYNX: no exudate, no erythema and lips, buccal mucosa, and tongue normal  NECK: +Tender + palpable cervical lymph nodes LYMPH: no palpable lymphadenopathy in the cervical, axillary  or inguinal LUNGS: clear to auscultation and percussion with normal breathing effort HEART: regular rate & rhythm and no murmurs and no lower extremity edema ABDOMEN: abdomen soft, non-tender and normal bowel sounds MUSCULOSKELETAL: + R BKA PSYCH: alert, some confusion noted NEURO: no focal  motor/sensory deficits   All questions were answered. The patient knows to call the clinic with any problems, questions or concerns.   The total time spent in the appointment was 40 minutes encounter with patient including review of chart and various tests results, discussions about plan of care and coordination of care plan  Dawson Bills, NP 08/01/2023 2:23 PM    Labs Reviewed:  Lab Results  Component Value Date   WBC 16.6 (H) 08/01/2023   HGB 12.3 (L) 08/01/2023   HCT 38.6 (L) 08/01/2023   MCV 82.7 08/01/2023   PLT 311 08/01/2023   Recent Labs    07/30/23 0422 07/30/23 2326 07/31/23 0233 08/01/23 0420  NA 131* 136 136 135  K 4.3 4.8 4.3 4.7  CL 101 101 100 103  CO2 22 21* 23 22  GLUCOSE 236* 153* 154* 157*  BUN 32* 32* 30* 31*  CREATININE 0.90 0.91 0.90 0.69  CALCIUM 11.9* 12.1* 11.7* 10.0  GFRNONAA >60 >60 >60 >60  PROT 6.0*  --  6.4* 5.8*  ALBUMIN 2.0*  --  2.4* 2.0*  AST 19  --  19 14*  ALT 13  --  12 11  ALKPHOS 82  --  80 74  BILITOT 0.6  --  0.6 0.6    Studies Reviewed:  DG Chest Port 1 View Result Date: 08/01/2023 CLINICAL DATA:  Shortness of breath. EXAM: PORTABLE CHEST 1 VIEW COMPARISON:  July 29, 2023. FINDINGS: Stable cardiomediastinal silhouette. There is again noted large left upper lobe mass with associated postobstructive atelectasis. Mild left basilar atelectasis and pleural effusion is noted. Multiple right-sided pulmonary nodules are noted consistent with metastatic disease IMPRESSION: Large left upper lobe mass is again noted with associated postobstructive atelectasis consistent with malignancy. Multiple metastatic lesions are noted in right lung. Electronically Signed   By: Lupita Raider M.D.   On: 08/01/2023 12:32   CT ANGIO HEAD NECK W WO CM Result Date: 07/30/2023 CLINICAL DATA:  Stroke follow-up EXAM: CT ANGIOGRAPHY HEAD AND NECK WITH AND WITHOUT CONTRAST TECHNIQUE: Multidetector CT imaging of the head and neck was performed using  the standard protocol during bolus administration of intravenous contrast. Multiplanar CT image reconstructions and MIPs were obtained to evaluate the vascular anatomy. Carotid stenosis measurements (when applicable) are obtained utilizing NASCET criteria, using the distal internal carotid diameter as the denominator. RADIATION DOSE REDUCTION: This exam was performed according to the departmental dose-optimization program which includes automated exposure control, adjustment of the mA and/or kV according to patient size and/or use of iterative reconstruction technique. CONTRAST:  75mL OMNIPAQUE IOHEXOL 350 MG/ML SOLN COMPARISON:  Head CT 07/28/2023 FINDINGS: CT HEAD FINDINGS Brain: Unchanged appearance of hemorrhagic lesion of the left posterior parietal lobe. No midline shift or other mass effect. Vascular: No hyperdense vessel or unexpected vascular calcification. Skull: The visualized skull base, calvarium and extracranial soft tissues are normal. Sinuses/Orbits: No fluid levels or advanced mucosal thickening of the visualized paranasal sinuses. No mastoid or middle ear effusion. Normal orbits. CTA NECK FINDINGS Skeleton: C3-7 ACDF. Other neck: Normal pharynx, larynx and major salivary glands. No cervical lymphadenopathy. Unremarkable thyroid gland. Upper chest: Complete opacification of the left apex. Multiple pulmonary nodules in the visualized right lung apex,  measuring up to 9 mm. Fluid adenopathy in the upper mediastinum. Aortic arch: There is calcific atherosclerosis of the aortic arch. Conventional 3 vessel aortic branching pattern. RIGHT carotid system: No dissection, occlusion or aneurysm. Mild atherosclerotic calcification at the carotid bifurcation without hemodynamically significant stenosis. LEFT carotid system: No dissection, occlusion or aneurysm. There is calcified atherosclerosis extending into the proximal ICA, resulting in less than 50% stenosis. Vertebral arteries: Codominant configuration.  There is no dissection, occlusion or flow-limiting stenosis to the skull base (V1-V3 segments). CTA HEAD FINDINGS POSTERIOR CIRCULATION: Vertebral arteries are normal. No proximal occlusion of the anterior or inferior cerebellar arteries. Basilar artery is normal. Superior cerebellar arteries are normal. Posterior cerebral arteries are normal. ANTERIOR CIRCULATION: Intracranial internal carotid arteries are normal. Anterior cerebral arteries are normal. Middle cerebral arteries are normal. Venous sinuses: As permitted by contrast timing, patent. Anatomic variants: None Review of the MIP images confirms the above findings. IMPRESSION: 1. No emergent large vessel occlusion or hemodynamically significant stenosis of the head or neck. 2. Unchanged appearance of hemorrhagic lesion of the left posterior parietal lobe. 3. Complete opacification of the left thoracic apex and multiple pulmonary nodules in the visualized right lung apex, measuring up to 9 mm. These findings are consistent with known metastatic disease 4. Upper mediastinal lymphadenopathy. Aortic Atherosclerosis (ICD10-I70.0). Electronically Signed   By: Deatra Robinson M.D.   On: 07/30/2023 19:49   VAS [place] LOWER EXTREMITY VENOUS (DVT) Result Date: 07/29/2023  Lower Venous DVT Study Patient Name:  Cody Pena  Date of Exam:   07/29/2023 Medical Rec #: [id number]        Accession #:    [id number] Date of Birth: [birth year]       Patient Gender: M Patient Age:   77 years Exam Location:  [facility] Procedure:      VAS [place] LOWER EXTREMITY VENOUS (DVT) Referring Phys: CHING TU --------------------------------------------------------------------------------  Indications: Pulmonary embolism.  Risk Factors: Confirmed PE Surgery RT BKA. Anticoagulation: Heparin. Comparison Study: No significant changes seen since prior exam 07/24/21 Performing Technologist: Shona Simpson  Examination Guidelines: A complete evaluation includes B-mode imaging, spectral  Doppler, color Doppler, and power Doppler as needed of all accessible portions of each vessel. Bilateral testing is considered an integral part of a complete examination. Limited examinations for reoccurring indications may be performed as noted. The reflux portion of the exam is performed with the patient in reverse Trendelenburg.  +---------+---------------+---------+-----------+----------+--------------+ RIGHT    CompressibilityPhasicitySpontaneityPropertiesThrombus Aging +---------+---------------+---------+-----------+----------+--------------+ CFV      Full           Yes      Yes                                 +---------+---------------+---------+-----------+----------+--------------+ SFJ      Full                                                        +---------+---------------+---------+-----------+----------+--------------+ FV Prox  Full                                                        +---------+---------------+---------+-----------+----------+--------------+  FV Mid   Full                                                        +---------+---------------+---------+-----------+----------+--------------+ FV DistalFull                                                        +---------+---------------+---------+-----------+----------+--------------+ PFV      Full                                                        +---------+---------------+---------+-----------+----------+--------------+ POP      Full           Yes      Yes                                 +---------+---------------+---------+-----------+----------+--------------+ PTV      Full                                                        +---------+---------------+---------+-----------+----------+--------------+ PERO     Full                                                        +---------+---------------+---------+-----------+----------+--------------+    +---------+---------------+---------+-----------+----------+--------------+ LEFT     CompressibilityPhasicitySpontaneityPropertiesThrombus Aging +---------+---------------+---------+-----------+----------+--------------+ CFV      Full           Yes      Yes                                 +---------+---------------+---------+-----------+----------+--------------+ SFJ      Full                                                        +---------+---------------+---------+-----------+----------+--------------+ FV Prox  Full                                                        +---------+---------------+---------+-----------+----------+--------------+ FV Mid   Full                                                        +---------+---------------+---------+-----------+----------+--------------+  FV DistalFull                                                        +---------+---------------+---------+-----------+----------+--------------+ PFV      Full                                                        +---------+---------------+---------+-----------+----------+--------------+ POP      Full           Yes      Yes                                 +---------+---------------+---------+-----------+----------+--------------+ PTV      Full                                                        +---------+---------------+---------+-----------+----------+--------------+ PERO     Full                                                        +---------+---------------+---------+-----------+----------+--------------+     Summary: BILATERAL: - No evidence of deep vein thrombosis seen in the lower extremities, bilaterally. -No evidence of popliteal cyst, bilaterally.   *See table(s) above for measurements and observations. Electronically signed by Gerarda Fraction on 07/29/2023 at 6:50:19 PM.    Final    ECHOCARDIOGRAM COMPLETE Result Date: 07/29/2023     ECHOCARDIOGRAM REPORT   Patient Name:   Cody Pena Date of Exam: 07/29/2023 Medical Rec #:  [id number]       Height:       75.0 in Accession #:    [id number]      Weight:       171.3 lb Date of Birth:  1960-12-15      BSA:          2.055 m Patient Age:    62 years        BP:           140/117 mmHg Patient Gender: M               HR:           122 bpm. Exam Location:  Inpatient Procedure: 2D Echo, Cardiac Doppler and Color Doppler Indications:    Atrial Fibrillation I48.91  History:        Patient has no prior history of Echocardiogram examinations.                 Arrythmias:Atrial Fibrillation, Signs/Symptoms:Dyspnea; Risk                 Factors:Hypertension, Diabetes, Current Smoker and Dyslipidemia.  Sonographer:    Lucendia Herrlich RCS Referring Phys: [id number] CHING T TU  Sonographer Comments: Image acquisition challenging due to uncooperative patient. IMPRESSIONS  1. Left ventricular ejection fraction, by estimation, is 30 to 35%. The left ventricle has moderately decreased function. The left ventricle demonstrates global hypokinesis. The left ventricular internal cavity size was mildly dilated. Left ventricular diastolic parameters are indeterminate.  2. Right ventricular systolic function is normal. The right ventricular size is normal. There is normal pulmonary artery systolic pressure. The estimated right ventricular systolic pressure is 25.8 mmHg.  3. The mitral valve is normal in structure. Trivial mitral valve regurgitation. No evidence of mitral stenosis.  4. The aortic valve is tricuspid. There is mild calcification of the aortic valve. Aortic valve regurgitation is trivial. No aortic stenosis is present.  5. The inferior vena cava is dilated in size with <50% respiratory variability, suggesting right atrial pressure of 15 mmHg.  6. The patient was in atrial fibrillation. FINDINGS  Left Ventricle: Left ventricular ejection fraction, by estimation, is 30 to 35%. The left ventricle has moderately  decreased function. The left ventricle demonstrates global hypokinesis. The left ventricular internal cavity size was mildly dilated. There is no left ventricular hypertrophy. Left ventricular diastolic parameters are indeterminate. Right Ventricle: The right ventricular size is normal. No increase in right ventricular wall thickness. Right ventricular systolic function is normal. There is normal pulmonary artery systolic pressure. The tricuspid regurgitant velocity is 1.64 m/s, and  with an assumed right atrial pressure of 15 mmHg, the estimated right ventricular systolic pressure is 25.8 mmHg. Left Atrium: Left atrial size was normal in size. Right Atrium: Right atrial size was normal in size. Pericardium: There is no evidence of pericardial effusion. Mitral Valve: The mitral valve is normal in structure. There is mild thickening of the mitral valve leaflet(s). Trivial mitral valve regurgitation. No evidence of mitral valve stenosis. Tricuspid Valve: The tricuspid valve is normal in structure. Tricuspid valve regurgitation is trivial. Aortic Valve: The aortic valve is tricuspid. There is mild calcification of the aortic valve. Aortic valve regurgitation is trivial. No aortic stenosis is present. Aortic valve peak gradient measures 9.7 mmHg. Pulmonic Valve: The pulmonic valve was normal in structure. Pulmonic valve regurgitation is not visualized. Aorta: The aortic root is normal in size and structure. Venous: The inferior vena cava is dilated in size with less than 50% respiratory variability, suggesting right atrial pressure of 15 mmHg. IAS/Shunts: No atrial level shunt detected by color flow Doppler.  LEFT VENTRICLE PLAX 2D LVIDd:         5.50 cm   Diastology LVIDs:         4.60 cm   LV e' medial:    9.64 cm/s LV PW:         1.00 cm   LV E/e' medial:  10.8 LV IVS:        0.90 cm   LV e' lateral:   14.10 cm/s LVOT diam:     2.10 cm   LV E/e' lateral: 7.4 LV SV:         62 LV SV Index:   30 LVOT Area:     3.46 cm   RIGHT VENTRICLE             IVC RV S prime:     15.50 cm/s  IVC diam: 2.40 cm TAPSE (M-mode): 2.1 cm LEFT ATRIUM         Index LA diam:    3.90 cm 1.90 cm/m  AORTIC VALVE AV Area (Vmax): 2.73 cm AV Vmax:        155.50 cm/s AV Peak Grad:   9.7  mmHg LVOT Vmax:      122.67 cm/s LVOT Vmean:     80.267 cm/s LVOT VTI:       0.180 m  AORTA Ao Root diam: 3.70 cm Ao Asc diam:  3.60 cm MITRAL VALVE                TRICUSPID VALVE MV Area (PHT): 5.84 cm     TR Peak grad:   10.8 mmHg MV Decel Time: 130 msec     TR Vmax:        164.00 cm/s MV E velocity: 104.00 cm/s MV A velocity: 70.50 cm/s   SHUNTS MV E/A ratio:  1.48         Systemic VTI:  0.18 m                             Systemic Diam: 2.10 cm Dalton McleanMD Electronically signed by Wilfred Lacy Signature Date/Time: 07/29/2023/5:47:50 PM    Final    EEG adult Result Date: 07/29/2023 Charlsie Quest, MD     07/29/2023 11:52 AM Patient Name: Cody Pena MRN: [id number] Epilepsy Attending: Charlsie Quest Referring Physician/Provider: Caryl Pina, MD Date: 07/29/2023 Duration: 25.56 mins Patient history: 62 yo M presented to the ED yesterday afternoon with a one week history of AMS and back pain. EEG to evaluate for seizure Level of alertness: Awake AEDs during EEG study: None Technical aspects: This EEG study was done with scalp electrodes positioned according to the 10-20 International system of electrode placement. Electrical activity was reviewed with band pass filter of 1-70Hz , sensitivity of 7 uV/mm, display speed of 20mm/sec with a 60Hz  notched filter applied as appropriate. EEG data were recorded continuously and digitally stored.  Video monitoring was available and reviewed as appropriate. Description: No posterior dominant rhythm was seen. EEG showed continuous generalized 3 to 6 Hz theta-delta slowing admixed with 12-14 Hz beta activity distributed symmetrically and diffusely.  Physiologic photic driving was not seen during photic stimulation.   Hyperventilation was not performed.   ABNORMALITY - Continuous slow, generalized IMPRESSION: This study is suggestive of moderate diffuse encephalopathy. No seizures or epileptiform discharges were seen throughout the recording. Charlsie Quest    CT Angio Chest Pulmonary Embolism (PE) W or WO Contrast Result Date: 07/29/2023 CLINICAL DATA:  Pulmonary embolism suspected. Positive D-dimer. CT yesterday demonstrating a large left upper lobe neoplasm with mediastinal and hilar adenopathy and bronchovascular encasement. EXAM: CT ANGIOGRAPHY CHEST WITH CONTRAST TECHNIQUE: Multidetector CT imaging of the chest was performed using the standard protocol during bolus administration of intravenous contrast. Multiplanar CT image reconstructions and MIPs were obtained to evaluate the vascular anatomy. RADIATION DOSE REDUCTION: This exam was performed according to the departmental dose-optimization program which includes automated exposure control, adjustment of the mA and/or kV according to patient size and/or use of iterative reconstruction technique. CONTRAST:  75mL OMNIPAQUE IOHEXOL 350 MG/ML SOLN COMPARISON:  CT chest, abdomen and pelvis with IV contrast yesterday at 5:28 p.m. (current exam 07/29/2023 at 12:08 a.m.). FINDINGS: Cardiovascular: There is a small embolic burden in the right lower lobe with nonocclusive thrombus within a posterior basal subsegmental branch likely extending into 2 of its downstream small branches. There is no evidence of right heart strain and no further embolus is seen. Bronchovascular tumor encasement narrows the distal left main pulmonary artery and it appears to almost completely occlude the left upper lobe pulmonary artery, and occludes the left superior pulmonary  vein. Pulmonary trunk is upper limits of normal in caliber. Cardiac size is normal. There is a small pericardial effusion. There is no venous dilatation. Aortic and three-vessel coronary artery atherosclerosis is present but  no aneurysm, stenosis or dissection, with scattered plaque in the great vessels. Mediastinum/Nodes: Large left upper lobe mass encases and obscures the hilar structures for the most part. There is undoubtedly left hilar adenopathy but it is difficult to separate from the mass. Multifocal mediastinal adenopathy is seen with subcarinal nodal mass 4.3 x 2.6 cm on 5:74, right paratracheal adenopathy with multiple lymph nodes enlarged for example measuring 2 x 1.9 cm on 5:48, enlarged right hilar nodes measuring as much as 2.4 x 3.1 cm on 5:71, and left axillary adenopathy with the largest of these lymph nodes measuring 2.3 x 2.0 cm on 5:53. The trachea and main bronchi are patent. Unremarkable thoracic esophagus. Lungs/Pleura: Large heterogeneous left upper lobe mass obstructs the left upper lobe bronchus, with no visible remaining aeration in the left upper lobe. Scattered foci of air in the apical portion of the mass could indicate tumoral necrosis or residual air bronchograms versus infectious complication. In total the mass measures 12.7 cm AP on 5:53, 9 cm transverse on 5:44, and 14.6 cm in height. Small layering left and minimal layering right pleural effusions are also again noted as well as multiple right lung nodules consistent with metastases. There is bilateral bronchial thickening but it is proportionately greater in the left lower lobe where there is peribronchovascular interstitial and airspace opacities which could indicate changes of bronchopneumonia or lymphangitic carcinomatosis. Multiple micronodules are noted in this distribution. Examples of some of the larger right lung metastases include a 1.1 cm nodule in the right upper lobe on 6:46, a right lower lobe superior segment nodule measuring 1.4 cm on 6:51, and a 1.3 cm right lower lobe nodule on 6:119. Others are smaller. There are mild centrilobular and paraseptal emphysematous changes in the right upper lobe. Upper Abdomen: No upper abdominal  metastasis is seen. Musculoskeletal: Thoracic spondylosis. Partially visible lower cervical ACDF plating. Multilevel thoracic degenerative disc disease. No destructive bone lesions. Review of the MIP images confirms the above findings. IMPRESSION: 1. Small subsegmental embolic burden in the right lower lobe, with no evidence of right heart strain. 2. Large 12.7 x 9 x 14.6 cm left upper lobe mass with bronchovascular encasement, occluding the left upper lobe bronchus and almost completely occluding the left upper lobe pulmonary artery, and occluding the left superior pulmonary vein. 3. Multiple right lung nodules consistent with metastases. 4. Bilateral bronchial thickening but proportionately greater in the left lower lobe where there is peribronchovascular interstitial and airspace opacities which could indicate changes of bronchopneumonia or lymphangitic carcinomatosis. 5. Small left and minimal right pleural effusions. 6. Multifocal mediastinal, right hilar and left axillary adenopathy. 7. Aortic and coronary artery atherosclerosis. 8. Emphysema. 9. These results will be called to the ordering clinician or representative by the Radiologist Assistant, and communication documented in the PACS or [employer]. Aortic Atherosclerosis (ICD10-I70.0) and Emphysema (ICD10-J43.9). Electronically Signed   By: Almira Bar M.D.   On: 07/29/2023 00:42   MR Lumbar Spine W Wo Contrast Result Date: 07/28/2023 CLINICAL DATA:  Initial evaluation for acute low back pain EXAM: MRI LUMBAR SPINE WITHOUT AND WITH CONTRAST TECHNIQUE: Multiplanar and multiecho pulse sequences of the lumbar spine were obtained without and with intravenous contrast. CONTRAST:  10mL GADAVIST GADOBUTROL 1 MMOL/ML IV SOLN COMPARISON:  Prior CT from earlier the same  day. FINDINGS: Segmentation: Standard. Lowest well-formed disc space labeled the L5-S1 level. Alignment: 2 mm degenerative retrolisthesis of L1 on L2 and L2 on L3, with 3 mm  anterolisthesis of L5 on S1. Vertebrae: Abnormal marrow replacement with edema and heterogeneous enhancement seen within the L1 vertebral body, concerning for osseous metastatic disease. Associated mild pathologic compression fracture measures up to 20% without bony retropulsion. No extra osseous or epidural extension of tumor. No other visible metastatic lesions within the lumbar spine. Bone marrow signal intensity otherwise normal. Prominent degenerative reactive endplate changes noted about the L2-3 interspace. No other abnormal marrow edema or enhancement. Conus medullaris and cauda equina: Conus extends to the T12-L1 no abnormal enhancement. Level. Conus and cauda equina appear normal. Paraspinal and other soft tissues: Changes of acute pyelonephritis involving the left greater than right kidneys noted, described on prior CT. 5.3 cm simple right renal cyst, benign in appearance, no follow-up imaging recommended. Disc levels: T12-L1: Mild diffuse disc bulge with reactive endplate spurring. Mild facet hypertrophy. No spinal stenosis. Foramina remain patent. L1-2: Degenerative intervertebral disc space narrowing with diffuse disc bulge and disc desiccation. Reactive endplate spurring. Mild bilateral facet hypertrophy. Resultant mild narrowing of the lateral recesses bilaterally. Central canal remains patent. Foramina remain adequately patent bilaterally. L2-3: Advanced degenerative intervertebral disc space narrowing with diffuse disc bulge and disc desiccation. Prominent reactive endplate change with marginal endplate osteophytic spurring and marrow edema. Mild to moderate bilateral facet arthrosis. Resultant moderate canal with bilateral subarticular stenosis. Moderate left worse than right L2 foraminal narrowing. L3-4: Diffuse disc bulge with reactive endplate spurring. Superimposed small left foraminal disc protrusion contacts the exiting left L3 nerve root (series 9, image 12). Mild-to-moderate bilateral  facet hypertrophy. Resultant mild narrowing of the lateral recesses bilaterally. Central canal remains patent. Mild to moderate left L3 foraminal stenosis. Right neural foramen remains patent. L4-5: Disc desiccation with diffuse disc bulge. Reactive endplate spurring. Moderate to severe bilateral facet arthrosis with associated small reactive joint effusions. Resultant mild to moderate bilateral subarticular stenosis. Central canal remains patent. Moderate to severe bilateral L4 foraminal narrowing. L5-S1: Anterolisthesis with degenerative intervertebral disc space narrowing. Diffuse disc bulge with reactive endplate spurring. Moderate bilateral facet hypertrophy. No significant spinal stenosis. Moderate bilateral L5 foraminal narrowing. IMPRESSION: 1. Abnormal marrow replacement with heterogeneous enhancement within the L1 vertebral body, concerning for osseous metastatic disease. Associated mild pathologic compression fracture measures up to 20% without bony retropulsion. No extra osseous or epidural extension of tumor. 2. No other evidence for metastatic disease within the lumbar spine. 3. Multilevel lumbar spondylosis with resultant moderate canal stenosis at L2-3. Associated multilevel foraminal narrowing as above, moderate to severe in nature at L4-5 bilaterally. 4. Changes of acute pyelonephritis involving the left greater than right kidneys, described on prior CT. Electronically Signed   By: Rise Mu M.D.   On: 07/28/2023 23:59   MR BRAIN W WO CONTRAST Result Date: 07/28/2023 CLINICAL DATA:  Initial evaluation for brain/CNS neoplasm EXAM: MRI HEAD WITHOUT AND WITH CONTRAST TECHNIQUE: Multiplanar, multiecho pulse sequences of the brain and surrounding structures were obtained without and with intravenous contrast. CONTRAST:  10mL GADAVIST GADOBUTROL 1 MMOL/ML IV SOLN COMPARISON:  Prior CT from 07/28/2023. FINDINGS: Brain: Cerebral volume within normal limits. Patchy T2/FLAIR hyperintensity  involving the periventricular and deep white matter both supra hemispheres, consistent with chronic small vessel ischemic disease, moderately advanced in nature. Punctate acute ischemic nonhemorrhagic infarct present at the right parietal cortex (series 9, image 98). No other evidence for acute  or subacute ischemia. Mixed cystic and solid peripherally enhancing mass positioned at the left occipital lobe is seen, measuring 4.0 x 3.5 x 3.7 cm. Mild surrounding vasogenic edema without significant regional mass effect or midline shift. 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 No other visible mass lesions or abnormal enhancement. No hydrocephalus or extra-axial fluid collection. Pituitary gland suprasellar region within normal limits. Vascular: Major intracranial vascular flow voids are maintained. Skull and upper cervical spine: Craniocervical junction within normal limits. Visualized bone marrow signal intensity within normal limits. Postoperative changes noted within the partially visualized upper cervical spine. Sinuses/Orbits: Globes and orbital soft tissues within normal limits. Paranasal sinuses are largely clear. No significant mastoid effusion. Other: None. IMPRESSION: 1. 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 Mild surrounding vasogenic edema without significant regional mass effect or midline shift. 2. 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 3. Underlying moderately advanced chronic microvascular ischemic disease. Electronically Signed   By: Rise Mu M.D.   On: 07/28/2023 23:48   CT L-SPINE NO CHARGE Result Date: 07/28/2023 CLINICAL DATA:  Back pain after fall December 1st EXAM: CT LUMBAR SPINE WITHOUT CONTRAST TECHNIQUE: Multidetector CT imaging of the lumbar spine was performed  without intravenous contrast administration. Multiplanar CT image reconstructions were also generated. RADIATION DOSE REDUCTION: This exam was performed according to the departmental dose-optimization program which includes automated exposure control, adjustment of the mA and/or kV according to patient size and/or use of iterative reconstruction technique. COMPARISON:  CT 04/05/2016 FINDINGS: Segmentation: 5 lumbar type vertebrae. Alignment: No evidence of traumatic listhesis. Mild grade 1 retrolisthesis of L2 is likely chronic. Vertebrae: Mild superior endplate compression of L1, may be chronic though is age indeterminate without recent comparison. Otherwise no evidence of acute fracture. There is mild heterogeneity of the marrow of L1 and metastasis is not excluded. Consider MRI for further evaluation. Paraspinal and other soft tissues: See separate report for findings in the abdomen and pelvis. Disc levels: Multilevel spondylosis and disc space height loss with degenerative endplate changes greatest at L2-L3 where it is advanced. Mild multilevel facet arthropathy. Spinal canal narrowing is greatest at L2-L3 where it is moderate. IMPRESSION: 1. Mild superior endplate compression of L1 may be chronic though is age indeterminate without recent comparison. 2. Heterogeneity of the marrow of L1. This may be related to chronic changes however metastasis is not excluded. Consider MRI for further evaluation. \ 3. Advanced disc space height loss at L2-L3. 4. See separate report for findings in the chest, abdomen, and pelvis. Electronically Signed   By: Minerva Fester M.D.   On: 07/28/2023 18:50   CT CHEST ABDOMEN PELVIS W CONTRAST Result Date: 07/28/2023 CLINICAL DATA:  Altered mental status for 1 week and back pain after a fall on December 1st. EXAM: CT CHEST, ABDOMEN, AND PELVIS WITH CONTRAST TECHNIQUE: Multidetector CT imaging of the chest, abdomen and pelvis was performed following the standard protocol during  bolus administration of intravenous contrast. RADIATION DOSE REDUCTION: This exam was performed according to the departmental dose-optimization program which includes automated exposure control, adjustment of the mA and/or kV according to patient size and/or use of iterative reconstruction technique. CONTRAST:  75mL OMNIPAQUE IOHEXOL 350 MG/ML SOLN COMPARISON:  CT abdomen and pelvis 01/14/2008 and chest radiographs 12/07/2014 FINDINGS: CT CHEST FINDINGS Cardiovascular: Normal heart size. Small pericardial effusion.  Pericardial thickening where it abuts the large left suprahilar mass. Coronary artery and aortic atherosclerotic calcification. The left upper lobe pulmonary arteries are not well visualized and may be compressed or occluded by the mass. Mediastinum/Nodes: Large heterogenous mass in the left suprahilar region extending into the left upper lobe. Mediastinal, bilateral hilar, and left axillary lymphadenopathy. For example a subcarinal node measures 2.5 cm (series 3/image 27); 1.3 cm right hilar node on series 3/image 34; and a left axillary node measures 2.2 cm (3/19). Trachea and esophagus are unremarkable. Lungs/Pleura: Large heterogenous mass left upper lobe/suprahilar mass consistent with malignancy measuring 12.1 x 8.7 by 12.1 cm. There are a few small locules of gas in the superior portion of the mass which may represent cavitation. The left upper lobe bronchus is occluded by the mass. Diffuse bronchial wall thickening in the lingula and left lower lobe with centrilobular micro nodules and tree-in-bud opacities. 1.6 cm subpleural nodule in the anterior lingula (series 4/image 90). Small left pleural effusion. Multiple pulmonary nodules in the right lung consistent with metastases. For example 12 mm nodule in the posterior right lower lobe (4/128) and 9 mm nodule in the right upper lobe (4/44). Mild emphysema in the right. No right pleural effusion. No pneumothorax. Musculoskeletal: No acute fracture.   No destructive osseous lesion. CT ABDOMEN PELVIS FINDINGS Hepatobiliary: No focal hepatic lesion. Layering sludge in the gallbladder. No biliary dilation or evidence of cholecystitis. Pancreas: Unremarkable. Spleen: Unremarkable. Adrenals/Urinary Tract: Normal adrenal glands. Heterogenous attenuation of the left kidney. Focal geographic hypoattenuation in the posterior right kidney. Mild asymmetric left-greater-than-right perinephric stranding. Findings are compatible with pyelonephritis. No urinary calculi or hydronephrosis. Unremarkable bladder. Stomach/Bowel: Normal caliber large and small bowel without bowel wall thickening. Stomach and appendix are within normal limits. Colonic diverticulosis without diverticulitis. Vascular/Lymphatic: Aortic atherosclerosis. 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 The stent in the right superficial femoral artery is not definitively opacified and may be occluded. No enlarged abdominal or pelvic lymph nodes. Reproductive: No acute abnormality. Other: No free intraperitoneal fluid or air. Musculoskeletal: No acute fracture or destructive osseous lesion in the pelvis. See separate report for findings in the lumbar spine. IMPRESSION: CHEST: 1. Large heterogenous mass in the left upper lobe/suprahilar region consistent with malignancy. 2. Diffuse bronchial wall thickening in the lingula and left lower lobe. Differential considerations include lymphangitic spread of tumor or airway infection/inflammation. 3. Multiple pulmonary nodules in the right lung consistent with metastases. 4. Mediastinal, bilateral hilar, and left axillary lymphadenopathy consistent with metastases. 5. Small left pleural effusion. ABDOMEN/PELVIS: 1. Bilateral pyelonephritis, left-greater-than-right. 2. The stent in the right superficial femoral artery is not definitively opacified and may be occluded though this is  incompletely evaluated on non arterial phase exam. 3. 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 Aortic Atherosclerosis (ICD10-I70.0) and Emphysema (ICD10-J43.9). Electronically Signed   By: Minerva Fester M.D.   On: 07/28/2023 18:16   CT Head Wo Contrast Result Date: 07/28/2023 CLINICAL DATA:  Delirium EXAM: CT HEAD WITHOUT CONTRAST TECHNIQUE: Contiguous axial images were obtained from the base of the skull through the vertex without intravenous contrast. RADIATION DOSE REDUCTION: This exam was performed according to the departmental dose-optimization program which includes automated exposure control, adjustment of the mA and/or kV according to patient size and/or use of iterative reconstruction technique. COMPARISON:  CT of the head  December 07, 2014. FINDINGS: Brain: Complex cystic and solid 3.4 cm mass in the left parietal lobe. This lesion has some mild hyperdensity associated with it which could represent mineralization and or hemorrhage. Surrounding edema and mild regional mass effect without midline shift. No evidence of acute large vascular territory infarct, mass occupying acute hemorrhage outside the above lesion, midline shift or hydrocephalus. Vascular: No hyperdense vessel identified. Skull: Normal. Negative for fracture or focal lesion. Sinuses/Orbits: Mostly clear sinuses.  No acute orbital findings. IMPRESSION: 1. Cystic and solid 3.4 cm mass in the left parietal lobe, which is concerning for a metastasis given the findings on the forthcoming CT of the chest/abdomen/pelvis. Recommend MRI of the head with contrast to further characterize and to assess for other lesions. 2. Surrounding edema and mild regional mass effect without midline shift. Electronically Signed   By: Feliberto Harts M.D.   On: 07/28/2023 18:02   Mr. Wolden was more alert when I saw him this morning.  He reports pain at the left chest.   Hypercalcemia has improved.  The pathology from the bronchoscopy is pending. He has been seen in consultation by Dr. Kathrynn Running.  Radiation to the brain and left chest mass is being considered.  We will follow-up on the pathology and request molecular testing as indicated.  I will recommend systemic therapy based on the final pathology result.  Recommendations: Continue anticoagulation for the pulmonary embolism Convert Decadron to oral dosing, taper as recommended by radiation oncology Outpatient follow-up with medical oncology and radiation oncology Can discontinue daily CBC and chemistry panel, check calcium level in a few days

## 2023-08-01 NOTE — Progress Notes (Addendum)
PROGRESS NOTE                                                                                                                                                                                                             Patient Demographics:    Cody Pena, is a 62 y.o. male, DOB - Mar 03, 1961, ZOX:[id number]  Outpatient Primary MD for the [facility] is [facility]    LOS - 4  Admit date - 07/28/2023    Chief Complaint  Patient presents with   Altered Mental Status   Hyperglycemia   Back Pain       Brief Narrative (HPI from H&P)    62 y.o. male with medical history significant of peripheral vascular disease s/p BKA, hypertension, insulin-dependent type 2 diabetes, and hyperlipidemia who presents with altered mental status and back pain.   Pt not able to provide much hx as he is lethargic after receiving IV morphine. He has been told of cancer in his imaging and appears depressed. Says his memory has been worse. Feels overwhelmed with his new cancer diagnosis. Denies any current pain although earlier had lower back pain. He is alert and oriented only to self although fully able to follow commands. Tells me he does not want his wife to know of his diagnoses yet.   Further workup in the ER is was suggestive of bilateral pyelonephritis with sepsis, new lung mass suspicious for malignancy with mets to spine, lymph nodes and brain, brain mass, small incidental CVA, PE, A-fib RVR and he was admitted.   Subjective:   Patient in bed, minimally confused but oriented x 3, cooperative, following basic commands, denies any headache chest or abdominal pain, agrees that we can discuss his case with wife and daughter both   Assessment  & Plan :    Sepsis (HCC)  -secondary to bilateral pyelonephritis - No signs of obstruction or hydronephrosis, continue empiric antibiotics and follow cultures, blood cultures negative till 2019  2024, urine cultures unfortunately were not sent out in the ER, continue IV fluids, sepsis pathophysiology has resolved and he has responded very well clinically to Rocephin.  Total duration 10 days of cephalosporin treatment due to pyelonephritis.  Paroxysmal atrial fibrillation with RVR (HCC) -[place] vas 2 score of greater than 3.  - Newly diagnosed atrial fibrillation.  Likely related to his  dehydration with hypercalcemia, sepsis and PE.  TSH stable, echo noted with slightly depressed EF, seen by cardiology, was on Cardizem drip, now switched to oral Lopressor by cardiology along with as needed IV Cardizem and IV Lopressor & IV digoxin ordered.  Rate much better controlled, on Eliquis as well.  Chronic systolic heart failure EF 35% with global hypokinesis on echocardiogram.  Cardiology on board.  For now supportive care, defer further management to cardiology, not a candidate for invasive testing or procedures due to his metastatic cancer.    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  Case discussed with pulmonary, they will see the patient and arrange for biopsy likely bronchoscopy, oncology also consulted to see.  Long-term prognosis does not appear to be good, pulmonary and oncology following, s/p bronchoscopy morning of 07/30/2023 with biopsy, follow biopsy results.  Defer further management of malignancy to medical and radiation oncology, case discussed with Dr. Thornton Papas in detail on 08/01/2023.  New treatment provided including radiation treatment will be palliative in nature, based on biopsy results life expectancy could be between few months to few years.  Palliative care also on board.  Brain mass  -MRI confirms brain mass with some vasogenic edema.  For now Decadron, stable  CVA (cerebral vascular accident) (HCC) - MRI brain imaging resulted demonstrating mixed cystic and solid enhancing mass to the left occiput lobe.  There was  also a punctate acute ischemic nonhemorrhagic infarct at the right parietal cortex.  Case discussed with Dr. Pearlean Brownie stroke team on 07/29/2023 in detail, cleared for heparin followed by Eliquis which has been started.  Stroke for or practical purposes appears to be incidental in nature.  Acute pulmonary embolism (HCC)   - CTA noted, currently see discussions about heparin with stroke team above.  Heparin without bolus with caution and monitor.  Back pain  -  MRI of the L-spine shows suspicion for possible metastatic malignancy in the spine.  Supportive care for now.  Hypercalcemia  -has been adequately hydrated received 2 doses of calcitonin along with Zometa, calcium levels are improving continue to monitor with gentle hydration, follow PTH RP results, PTH appears to be appropriately suppressed.  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  Patient vascular surgery follow-up.  S/P BKA (below knee amputation), right (HCC)  History of PAD, There is question of possible occluded right superior femoral artery stent on CT abdomen/pelvis but patient denies any claudication or pain, monitor clinically.  Outpatient vascular surgery follow-up.  Hospital acquired metabolic encephalopathy.  Due to being in unfamiliar setting, brain mass, steroid in use, placed on twice daily Seroquel and as needed Haldol, mentation improving, oriented x 3 on 08/01/2023  T2DM (type 2 diabetes mellitus) (HCC)  - insulin-dependent T2DM , keep on resistant SSI while on steroids for cerebral edema, Semglee added  Lab Results  Component Value Date   HGBA1C 9.9 (H) 07/28/2023   CBG (last 3)  Recent Labs    07/31/23 1225 07/31/23 1624 07/31/23 2136  GLUCAP 239* 154* 88           Condition - Extremely Guarded  Family Communication  : Called daughter [name] [phone]  on 07/29/2023 at 9 AM, no response.  Discussed  with her in detail on 08/01/2023 explained to her that any treatment will be palliative in nature depending on the biopsy results could  prolong his life for few months to few years but again we will have to wait for the biopsy results for full prognosis.  Code Status : Full code  Consults  : Pulmonary, oncology, radiation Onc, cardiology, neurology, Pall care  PUD Prophylaxis :     Procedures  :     TTE.  1. Left ventricular ejection fraction, by estimation, is 30 to 35%. The left ventricle has moderately decreased function. The left ventricle demonstrates global hypokinesis. The left ventricular internal cavity size was mildly dilated. Left ventricular diastolic parameters are indeterminate.  2. Right ventricular systolic function is normal. The right ventricular size is normal. There is normal pulmonary artery systolic pressure. The estimated right ventricular systolic pressure is 25.8 mmHg.  3. The mitral valve is normal in structure. Trivial mitral valve regurgitation. No evidence of mitral stenosis.  4. The aortic valve is tricuspid. There is mild calcification of the aortic valve. Aortic valve regurgitation is trivial. No aortic stenosis is present.  5. The inferior vena cava is dilated in size with <50% respiratory variability, suggesting right atrial pressure of 15 mmHg.  6. The patient was in atrial fibrillation  EEG - no seizures  CT chest abdomen pelvis.  CHEST: 1. Large heterogenous mass in the left upper lobe/suprahilar region consistent with malignancy. 2. Diffuse bronchial wall thickening in the lingula and left lower lobe. Differential considerations include lymphangitic spread of tumor or airway infection/inflammation. 3. Multiple pulmonary nodules in the right lung consistent with metastases. 4. Mediastinal, bilateral hilar, and left axillary lymphadenopathy consistent with metastases. 5. Small left pleural effusion. ABDOMEN/PELVIS: 1. Bilateral pyelonephritis,  left-greater-than-right. 2. The stent in the right superficial femoral artery is not definitively opacified and may be occluded though this is incompletely evaluated on non arterial phase exam. 3. 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 Aortic Atherosclerosis    CTA chest.   1. Small subsegmental embolic burden in the right lower lobe, with no evidence of right heart strain. 2. Large 12.7 x 9 x 14.6 cm left upper lobe mass with bronchovascular encasement, occluding the left upper lobe bronchus and almost completely occluding the left upper lobe pulmonary artery, and occluding the left superior pulmonary vein. 3. Multiple right lung nodules consistent with metastases. 4. Bilateral bronchial thickening but proportionately greater in the left lower lobe where there is peribronchovascular interstitial and airspace opacities which could indicate changes of bronchopneumonia or lymphangitic carcinomatosis. 5. Small left and minimal right pleural effusions. 6. Multifocal mediastinal, right hilar and left axillary adenopathy. 7. Aortic and coronary artery atherosclerosis. 8. Emphysema. 9. These results will be called to the ordering clinician or representative by the Radiologist Assistant, and communication documented in the PACS or [employer]. Aortic Atherosclerosis  and Emphysema   MR L Spine - 1. Abnormal marrow replacement with heterogeneous enhancement within the L1 vertebral body, concerning for osseous metastatic disease. Associated mild pathologic compression fracture measures up to 20% without bony retropulsion. No extra osseous or epidural extension of tumor. 2. No other evidence for metastatic disease within the lumbar spine. 3. Multilevel lumbar spondylosis with resultant moderate canal stenosis at L2-3. Associated multilevel foraminal narrowing as above, moderate to severe in nature at L4-5 bilaterally. 4.  Changes of acute pyelonephritis involving the left greater than right kidneys, described on prior CT.   MRI brain. 1. 4.0 x 3.5 x 3.7 cm mixed cystic and solid peripherally enhancing mass at the  left occipital lobe, most concerning for a solitary intracranial metastasis. Mild surrounding vasogenic edema without significant regional mass effect or midline shift. 2. 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 3. Underlying moderately advanced chronic microvascular ischemic disease      Disposition Plan  :    Status is: Inpatient  DVT Prophylaxis  :    SCDs Start: 07/28/23 2115 .Marland Kitchen Heparin apixaban (ELIQUIS) tablet 5 mg    Lab Results  Component Value Date   PLT 311 08/01/2023    Diet :  Diet Order             DIET SOFT Fluid consistency: Thin  Diet effective now                    Inpatient Medications  Scheduled Meds:  apixaban  5 mg Oral BID   aspirin EC  81 mg Oral Daily   atorvastatin  10 mg Oral QPM   dexamethasone (DECADRON) injection  4 mg Intravenous QHS   digoxin  0.25 mg Oral Daily   insulin aspart  0-20 Units Subcutaneous TID PC & HS   insulin glargine-yfgn  10 Units Subcutaneous Daily   metoprolol tartrate  75 mg Oral BID   QUEtiapine  25 mg Oral BID   Continuous Infusions:  sodium chloride 100 mL/hr at 07/31/23 0800   ceFEPime (MAXIPIME) IV 2 g (08/01/23 0320)   PRN Meds:.diltiazem, haloperidol lactate, HYDROcodone-acetaminophen, morphine    Objective:   Vitals:   08/01/23 0000 08/01/23 0200 08/01/23 0400 08/01/23 0600  BP: 114/77 117/74 130/82 122/78  Pulse: 76 75 81 88  Resp: 14 15 (!) 28 15  Temp:   97.6 F (36.4 C)   TempSrc:   Oral   SpO2: 96% 96% 95% 96%  Weight:      Height:        Wt Readings from Last 3 Encounters:  07/30/23 77.7 kg  01/06/23 97.5 kg  01/31/22 104.3 kg    No intake or output data in the 24 hours ending 08/01/23 0738   Physical Exam  Awake mildly confuse, No new F.N  deficits, Normal affect [place].AT,PERRAL Supple Neck, No JVD,   Symmetrical Chest wall movement, Good air movement bilaterally, CTAB iRRR,No Gallops,Rubs or new Murmurs,  +ve B.Sounds, Abd Soft, No tenderness,   Right BKA    Data Review:    Recent Labs  Lab 07/28/23 1548 07/29/23 0422 07/30/23 0422 07/31/23 0233 08/01/23 0420  WBC 18.6* 18.7* 18.2* 19.6* 16.6*  HGB 13.0 11.3* 11.1* 12.7* 12.3*  HCT 40.9 35.7* 35.5* 40.0 38.6*  PLT 515* 391 448* 499* 311  MCV 83.1 83.2 84.5 83.7 82.7  MCH 26.4 26.3 26.4 26.6 26.3  MCHC 31.8 31.7 31.3 31.8 31.9  RDW 14.3 14.4 14.5 14.6 14.8  LYMPHSABS 1.6  --  0.8 0.6* 0.5*  MONOABS 1.3*  --  0.6 1.0 0.6  EOSABS 0.1  --  0.0 0.0 0.0  BASOSABS 0.1  --  0.1 0.1 0.1    Recent Labs  Lab 07/28/23 1548 07/28/23 1553 07/28/23 1801 07/28/23 2304 07/29/23 0422 07/29/23 0942 07/30/23 0422 07/30/23 2326 07/31/23 0233 08/01/23 0420  NA 132*  --   --   --  130*  --  131* 136 136 135  K 4.5  --   --   --  4.5  --  4.3 4.8 4.3 4.7  CL 95*  --   --   --  97*  --  101 101 100 103  CO2 24  --   --   --  24  --  22 21* 23 22  ANIONGAP 13  --   --   --  9  --  8 14 13 10   GLUCOSE 228*  --   --   --  189*  --  236* 153* 154* 157*  BUN 32*  --   --   --  30*  --  32* 32* 30* 31*  CREATININE 1.01  --   --   --  1.19  --  0.90 0.91 0.90 0.69  AST 21  --   --   --  15  --  19  --  19 14*  ALT 13  --   --   --  11  --  13  --  12 11  ALKPHOS 95  --   --   --  85  --  82  --  80 74  BILITOT 0.9  --   --   --  0.6  --  0.6  --  0.6 0.6  ALBUMIN 2.2*  --   --   --  2.0*  --  2.0*  --  2.4* 2.0*  CRP  --   --   --   --   --  18.1* 11.0*  --  8.6* 11.3*  PROCALCITON  --   --   --   --   --  <0.10 <0.10  --  <0.10 <0.10  LATICACIDVEN  --  3.7* 2.0* 1.6  --   --   --   --   --   --   TSH  --   --   --   --  1.533  --   --   --   --   --   HGBA1C  --   --   --  9.9*  --   --   --   --   --   --   BNP  --   --   --   --   --  250.4* 269.7* 516.6* 219.9* 117.6*   MG  --   --   --   --   --  1.9 2.0 1.9 2.0 2.1  CALCIUM 12.3*  --   --   --  12.3*  --  11.9* 12.1* 11.7* 10.0      Recent Labs  Lab 07/28/23 1553 07/28/23 1801 07/28/23 2304 07/29/23 0422 07/29/23 0942 07/30/23 0422 07/30/23 2326 07/31/23 0233 08/01/23 0420  CRP  --   --   --   --  18.1* 11.0*  --  8.6* 11.3*  PROCALCITON  --   --   --   --  <0.10 <0.10  --  <0.10 <0.10  LATICACIDVEN 3.7* 2.0* 1.6  --   --   --   --   --   --   TSH  --   --   --  1.533  --   --   --   --   --   HGBA1C  --   --  9.9*  --   --   --   --   --   --   BNP  --   --   --   --  250.4* 269.7* 516.6* 219.9* 117.6*  MG  --   --   --   --  1.9 2.0 1.9 2.0 2.1  CALCIUM  --   --   --  12.3*  --  11.9* 12.1* 11.7* 10.0    Lab Results  Component Value Date   CHOL 131 07/24/2021   HDL 28 (L) 07/24/2021   LDLCALC 48 07/24/2021   TRIG 273 (H) 07/24/2021   CHOLHDL 4.7 07/24/2021    Lab Results  Component Value Date   HGBA1C 9.9 (H) 07/28/2023     Micro Results Recent Results (from the past 240 hours)  Blood culture (routine x 2)     Status: None (Preliminary result)   Collection Time: 07/28/23  3:48 PM   Specimen: BLOOD RIGHT ARM  Result Value Ref Range Status   Specimen Description BLOOD RIGHT ARM  Final   Special Requests   Final    BOTTLES DRAWN AEROBIC AND ANAEROBIC Blood Culture adequate volume   Culture   Final    NO GROWTH 3 DAYS Performed at [facility], [address] [place], [place] [address]    Report Status PENDING  Incomplete  Blood culture (routine x 2)     Status: None (Preliminary result)   Collection Time: 07/28/23  3:48 PM   Specimen: BLOOD LEFT ARM  Result Value Ref Range Status   Specimen Description BLOOD LEFT ARM  Final   Special Requests   Final    BOTTLES DRAWN AEROBIC AND ANAEROBIC Blood Culture adequate volume   Culture   Final    NO GROWTH 3 DAYS Performed at [facility], [address], [place], [place] [address]    Report Status PENDING   Incomplete    Radiology Reports CT ANGIO HEAD NECK W WO CM Result Date: 07/30/2023 CLINICAL DATA:  Stroke follow-up EXAM: CT ANGIOGRAPHY HEAD AND NECK WITH AND WITHOUT CONTRAST TECHNIQUE: Multidetector CT imaging of the head and neck was performed using the standard protocol during bolus administration of intravenous contrast. Multiplanar CT image reconstructions and MIPs were obtained to evaluate the vascular anatomy. Carotid stenosis measurements (when applicable) are obtained utilizing NASCET criteria, using the distal internal carotid diameter as the denominator. RADIATION DOSE REDUCTION: This exam was performed according to the departmental dose-optimization program which includes automated exposure control, adjustment of the mA and/or kV according to patient size and/or use of iterative reconstruction technique. CONTRAST:  75mL OMNIPAQUE IOHEXOL 350 MG/ML SOLN COMPARISON:  Head CT 07/28/2023 FINDINGS: CT HEAD FINDINGS Brain: Unchanged appearance of hemorrhagic lesion of the left posterior parietal lobe. No midline shift or other mass effect. Vascular: No hyperdense vessel or unexpected vascular calcification. Skull: The visualized skull base, calvarium and extracranial soft tissues are normal. Sinuses/Orbits: No fluid levels or advanced mucosal thickening of the visualized paranasal sinuses. No mastoid or middle ear effusion. Normal orbits. CTA NECK FINDINGS Skeleton: C3-7 ACDF. Other neck: Normal pharynx, larynx and major salivary glands. No cervical lymphadenopathy. Unremarkable thyroid gland. Upper chest: Complete opacification of the left apex. Multiple pulmonary nodules in the visualized right lung apex, measuring up to 9 mm. Fluid adenopathy in the upper mediastinum. Aortic arch: There is calcific atherosclerosis of the aortic arch. Conventional 3 vessel aortic branching pattern. RIGHT carotid system: No dissection, occlusion or aneurysm. Mild atherosclerotic calcification at the carotid  bifurcation without hemodynamically significant stenosis. LEFT carotid system: No dissection, occlusion or aneurysm. There is calcified atherosclerosis extending into the proximal ICA, resulting in less than 50% stenosis. Vertebral arteries: Codominant configuration. There is no dissection, occlusion or flow-limiting stenosis to the skull base (V1-V3 segments). CTA HEAD FINDINGS POSTERIOR CIRCULATION: Vertebral arteries are normal. No proximal occlusion of the anterior  or inferior cerebellar arteries. Basilar artery is normal. Superior cerebellar arteries are normal. Posterior cerebral arteries are normal. ANTERIOR CIRCULATION: Intracranial internal carotid arteries are normal. Anterior cerebral arteries are normal. Middle cerebral arteries are normal. Venous sinuses: As permitted by contrast timing, patent. Anatomic variants: None Review of the MIP images confirms the above findings. IMPRESSION: 1. No emergent large vessel occlusion or hemodynamically significant stenosis of the head or neck. 2. Unchanged appearance of hemorrhagic lesion of the left posterior parietal lobe. 3. Complete opacification of the left thoracic apex and multiple pulmonary nodules in the visualized right lung apex, measuring up to 9 mm. These findings are consistent with known metastatic disease 4. Upper mediastinal lymphadenopathy. Aortic Atherosclerosis (ICD10-I70.0). Electronically Signed   By: Deatra Robinson M.D.   On: 07/30/2023 19:49   VAS [place] LOWER EXTREMITY VENOUS (DVT) Result Date: 07/29/2023  Lower Venous DVT Study Patient Name:  MIKOL HADLOCK  Date of Exam:   07/29/2023 Medical Rec #: [id number]        Accession #:    [id number] Date of Birth: Aug 03, 1961       Patient Gender: M Patient Age:   70 years Exam Location:  [facility] Procedure:      VAS [place] LOWER EXTREMITY VENOUS (DVT) Referring Phys: CHING TU --------------------------------------------------------------------------------  Indications: Pulmonary embolism.   Risk Factors: Confirmed PE Surgery RT BKA. Anticoagulation: Heparin. Comparison Study: No significant changes seen since prior exam 07/24/21 Performing Technologist: Shona Simpson  Examination Guidelines: A complete evaluation includes B-mode imaging, spectral Doppler, color Doppler, and power Doppler as needed of all accessible portions of each vessel. Bilateral testing is considered an integral part of a complete examination. Limited examinations for reoccurring indications may be performed as noted. The reflux portion of the exam is performed with the patient in reverse Trendelenburg.  +---------+---------------+---------+-----------+----------+--------------+ RIGHT    CompressibilityPhasicitySpontaneityPropertiesThrombus Aging +---------+---------------+---------+-----------+----------+--------------+ CFV      Full           Yes      Yes                                 +---------+---------------+---------+-----------+----------+--------------+ SFJ      Full                                                        +---------+---------------+---------+-----------+----------+--------------+ FV Prox  Full                                                        +---------+---------------+---------+-----------+----------+--------------+ FV Mid   Full                                                        +---------+---------------+---------+-----------+----------+--------------+ FV DistalFull                                                        +---------+---------------+---------+-----------+----------+--------------+  PFV      Full                                                        +---------+---------------+---------+-----------+----------+--------------+ POP      Full           Yes      Yes                                 +---------+---------------+---------+-----------+----------+--------------+ PTV      Full                                                         +---------+---------------+---------+-----------+----------+--------------+ PERO     Full                                                        +---------+---------------+---------+-----------+----------+--------------+   +---------+---------------+---------+-----------+----------+--------------+ LEFT     CompressibilityPhasicitySpontaneityPropertiesThrombus Aging +---------+---------------+---------+-----------+----------+--------------+ CFV      Full           Yes      Yes                                 +---------+---------------+---------+-----------+----------+--------------+ SFJ      Full                                                        +---------+---------------+---------+-----------+----------+--------------+ FV Prox  Full                                                        +---------+---------------+---------+-----------+----------+--------------+ FV Mid   Full                                                        +---------+---------------+---------+-----------+----------+--------------+ FV DistalFull                                                        +---------+---------------+---------+-----------+----------+--------------+ PFV      Full                                                        +---------+---------------+---------+-----------+----------+--------------+  POP      Full           Yes      Yes                                 +---------+---------------+---------+-----------+----------+--------------+ PTV      Full                                                        +---------+---------------+---------+-----------+----------+--------------+ PERO     Full                                                        +---------+---------------+---------+-----------+----------+--------------+     Summary: BILATERAL: - No evidence of deep vein thrombosis seen in the lower extremities, bilaterally. -No  evidence of popliteal cyst, bilaterally.   *See table(s) above for measurements and observations. Electronically signed by Gerarda Fraction on 07/29/2023 at 6:50:19 PM.    Final    ECHOCARDIOGRAM COMPLETE Result Date: 07/29/2023    ECHOCARDIOGRAM REPORT   Patient Name:   TAYON NIBERT Date of Exam: 07/29/2023 Medical Rec #:  [id number]       Height:       75.0 in Accession #:    [id number]      Weight:       171.3 lb Date of Birth:  [birth year]      BSA:          2.055 m Patient Age:    62 years        BP:           140/117 mmHg Patient Gender: M               HR:           122 bpm. Exam Location:  Inpatient Procedure: 2D Echo, Cardiac Doppler and Color Doppler Indications:    Atrial Fibrillation I48.91  History:        Patient has no prior history of Echocardiogram examinations.                 Arrythmias:Atrial Fibrillation, Signs/Symptoms:Dyspnea; Risk                 Factors:Hypertension, Diabetes, Current Smoker and Dyslipidemia.  Sonographer:    Lucendia Herrlich RCS Referring Phys: [id number] CHING T TU  Sonographer Comments: Image acquisition challenging due to uncooperative patient. IMPRESSIONS  1. Left ventricular ejection fraction, by estimation, is 30 to 35%. The left ventricle has moderately decreased function. The left ventricle demonstrates global hypokinesis. The left ventricular internal cavity size was mildly dilated. Left ventricular diastolic parameters are indeterminate.  2. Right ventricular systolic function is normal. The right ventricular size is normal. There is normal pulmonary artery systolic pressure. The estimated right ventricular systolic pressure is 25.8 mmHg.  3. The mitral valve is normal in structure. Trivial mitral valve regurgitation. No evidence of mitral stenosis.  4. The aortic valve is tricuspid. There is mild calcification of the aortic valve. Aortic valve regurgitation is trivial. No aortic stenosis is present.  5. The inferior vena cava is dilated  in size with <50%  respiratory variability, suggesting right atrial pressure of 15 mmHg.  6. The patient was in atrial fibrillation. FINDINGS  Left Ventricle: Left ventricular ejection fraction, by estimation, is 30 to 35%. The left ventricle has moderately decreased function. The left ventricle demonstrates global hypokinesis. The left ventricular internal cavity size was mildly dilated. There is no left ventricular hypertrophy. Left ventricular diastolic parameters are indeterminate. Right Ventricle: The right ventricular size is normal. No increase in right ventricular wall thickness. Right ventricular systolic function is normal. There is normal pulmonary artery systolic pressure. The tricuspid regurgitant velocity is 1.64 m/s, and  with an assumed right atrial pressure of 15 mmHg, the estimated right ventricular systolic pressure is 25.8 mmHg. Left Atrium: Left atrial size was normal in size. Right Atrium: Right atrial size was normal in size. Pericardium: There is no evidence of pericardial effusion. Mitral Valve: The mitral valve is normal in structure. There is mild thickening of the mitral valve leaflet(s). Trivial mitral valve regurgitation. No evidence of mitral valve stenosis. Tricuspid Valve: The tricuspid valve is normal in structure. Tricuspid valve regurgitation is trivial. Aortic Valve: The aortic valve is tricuspid. There is mild calcification of the aortic valve. Aortic valve regurgitation is trivial. No aortic stenosis is present. Aortic valve peak gradient measures 9.7 mmHg. Pulmonic Valve: The pulmonic valve was normal in structure. Pulmonic valve regurgitation is not visualized. Aorta: The aortic root is normal in size and structure. Venous: The inferior vena cava is dilated in size with less than 50% respiratory variability, suggesting right atrial pressure of 15 mmHg. IAS/Shunts: No atrial level shunt detected by color flow Doppler.  LEFT VENTRICLE PLAX 2D LVIDd:         5.50 cm   Diastology LVIDs:          4.60 cm   LV e' medial:    9.64 cm/s LV PW:         1.00 cm   LV E/e' medial:  10.8 LV IVS:        0.90 cm   LV e' lateral:   14.10 cm/s LVOT diam:     2.10 cm   LV E/e' lateral: 7.4 LV SV:         62 LV SV Index:   30 LVOT Area:     3.46 cm  RIGHT VENTRICLE             IVC RV S prime:     15.50 cm/s  IVC diam: 2.40 cm TAPSE (M-mode): 2.1 cm LEFT ATRIUM         Index LA diam:    3.90 cm 1.90 cm/m  AORTIC VALVE AV Area (Vmax): 2.73 cm AV Vmax:        155.50 cm/s AV Peak Grad:   9.7 mmHg LVOT Vmax:      122.67 cm/s LVOT Vmean:     80.267 cm/s LVOT VTI:       0.180 m  AORTA Ao Root diam: 3.70 cm Ao Asc diam:  3.60 cm MITRAL VALVE                TRICUSPID VALVE MV Area (PHT): 5.84 cm     TR Peak grad:   10.8 mmHg MV Decel Time: 130 msec     TR Vmax:        164.00 cm/s MV E velocity: 104.00 cm/s MV A velocity: 70.50 cm/s   SHUNTS MV E/A ratio:  1.48  Systemic VTI:  0.18 m                             Systemic Diam: 2.10 cm Dalton McleanMD Electronically signed by Wilfred Lacy Signature Date/Time: 07/29/2023/5:47:50 PM    Final    EEG adult Result Date: 07/29/2023 Charlsie Quest, MD     07/29/2023 11:52 AM Patient Name: JACQUEZ STOLARZ MRN: [id number] Epilepsy Attending: Charlsie Quest Referring Physician/Provider: Caryl Pina, MD Date: 07/29/2023 Duration: 25.56 mins Patient history: 62 yo M presented to the ED yesterday afternoon with a one week history of AMS and back pain. EEG to evaluate for seizure Level of alertness: Awake AEDs during EEG study: None Technical aspects: This EEG study was done with scalp electrodes positioned according to the 10-20 International system of electrode placement. Electrical activity was reviewed with band pass filter of 1-70Hz , sensitivity of 7 uV/mm, display speed of 90mm/sec with a 60Hz  notched filter applied as appropriate. EEG data were recorded continuously and digitally stored.  Video monitoring was available and reviewed as appropriate. Description: No  posterior dominant rhythm was seen. EEG showed continuous generalized 3 to 6 Hz theta-delta slowing admixed with 12-14 Hz beta activity distributed symmetrically and diffusely.  Physiologic photic driving was not seen during photic stimulation.  Hyperventilation was not performed.   ABNORMALITY - Continuous slow, generalized IMPRESSION: This study is suggestive of moderate diffuse encephalopathy. No seizures or epileptiform discharges were seen throughout the recording. Charlsie Quest    CT Angio Chest Pulmonary Embolism (PE) W or WO Contrast Result Date: 07/29/2023 CLINICAL DATA:  Pulmonary embolism suspected. Positive D-dimer. CT yesterday demonstrating a large left upper lobe neoplasm with mediastinal and hilar adenopathy and bronchovascular encasement. EXAM: CT ANGIOGRAPHY CHEST WITH CONTRAST TECHNIQUE: Multidetector CT imaging of the chest was performed using the standard protocol during bolus administration of intravenous contrast. Multiplanar CT image reconstructions and MIPs were obtained to evaluate the vascular anatomy. RADIATION DOSE REDUCTION: This exam was performed according to the departmental dose-optimization program which includes automated exposure control, adjustment of the mA and/or kV according to patient size and/or use of iterative reconstruction technique. CONTRAST:  75mL OMNIPAQUE IOHEXOL 350 MG/ML SOLN COMPARISON:  CT chest, abdomen and pelvis with IV contrast yesterday at 5:28 p.m. (current exam 07/29/2023 at 12:08 a.m.). FINDINGS: Cardiovascular: There is a small embolic burden in the right lower lobe with nonocclusive thrombus within a posterior basal subsegmental branch likely extending into 2 of its downstream small branches. There is no evidence of right heart strain and no further embolus is seen. Bronchovascular tumor encasement narrows the distal left main pulmonary artery and it appears to almost completely occlude the left upper lobe pulmonary artery, and occludes the left  superior pulmonary vein. Pulmonary trunk is upper limits of normal in caliber. Cardiac size is normal. There is a small pericardial effusion. There is no venous dilatation. Aortic and three-vessel coronary artery atherosclerosis is present but no aneurysm, stenosis or dissection, with scattered plaque in the great vessels. Mediastinum/Nodes: Large left upper lobe mass encases and obscures the hilar structures for the most part. There is undoubtedly left hilar adenopathy but it is difficult to separate from the mass. Multifocal mediastinal adenopathy is seen with subcarinal nodal mass 4.3 x 2.6 cm on 5:74, right paratracheal adenopathy with multiple lymph nodes enlarged for example measuring 2 x 1.9 cm on 5:48, enlarged right hilar nodes measuring as much as 2.4 x 3.1 cm  on 5:71, and left axillary adenopathy with the largest of these lymph nodes measuring 2.3 x 2.0 cm on 5:53. The trachea and main bronchi are patent. Unremarkable thoracic esophagus. Lungs/Pleura: Large heterogeneous left upper lobe mass obstructs the left upper lobe bronchus, with no visible remaining aeration in the left upper lobe. Scattered foci of air in the apical portion of the mass could indicate tumoral necrosis or residual air bronchograms versus infectious complication. In total the mass measures 12.7 cm AP on 5:53, 9 cm transverse on 5:44, and 14.6 cm in height. Small layering left and minimal layering right pleural effusions are also again noted as well as multiple right lung nodules consistent with metastases. There is bilateral bronchial thickening but it is proportionately greater in the left lower lobe where there is peribronchovascular interstitial and airspace opacities which could indicate changes of bronchopneumonia or lymphangitic carcinomatosis. Multiple micronodules are noted in this distribution. Examples of some of the larger right lung metastases include a 1.1 cm nodule in the right upper lobe on 6:46, a right lower lobe  superior segment nodule measuring 1.4 cm on 6:51, and a 1.3 cm right lower lobe nodule on 6:119. Others are smaller. There are mild centrilobular and paraseptal emphysematous changes in the right upper lobe. Upper Abdomen: No upper abdominal metastasis is seen. Musculoskeletal: Thoracic spondylosis. Partially visible lower cervical ACDF plating. Multilevel thoracic degenerative disc disease. No destructive bone lesions. Review of the MIP images confirms the above findings. IMPRESSION: 1. Small subsegmental embolic burden in the right lower lobe, with no evidence of right heart strain. 2. Large 12.7 x 9 x 14.6 cm left upper lobe mass with bronchovascular encasement, occluding the left upper lobe bronchus and almost completely occluding the left upper lobe pulmonary artery, and occluding the left superior pulmonary vein. 3. Multiple right lung nodules consistent with metastases. 4. Bilateral bronchial thickening but proportionately greater in the left lower lobe where there is peribronchovascular interstitial and airspace opacities which could indicate changes of bronchopneumonia or lymphangitic carcinomatosis. 5. Small left and minimal right pleural effusions. 6. Multifocal mediastinal, right hilar and left axillary adenopathy. 7. Aortic and coronary artery atherosclerosis. 8. Emphysema. 9. These results will be called to the ordering clinician or representative by the Radiologist Assistant, and communication documented in the PACS or [employer]. Aortic Atherosclerosis (ICD10-I70.0) and Emphysema (ICD10-J43.9). Electronically Signed   By: Almira Bar M.D.   On: 07/29/2023 00:42   MR Lumbar Spine W Wo Contrast Result Date: 07/28/2023 CLINICAL DATA:  Initial evaluation for acute low back pain EXAM: MRI LUMBAR SPINE WITHOUT AND WITH CONTRAST TECHNIQUE: Multiplanar and multiecho pulse sequences of the lumbar spine were obtained without and with intravenous contrast. CONTRAST:  10mL GADAVIST GADOBUTROL 1  MMOL/ML IV SOLN COMPARISON:  Prior CT from earlier the same day. FINDINGS: Segmentation: Standard. Lowest well-formed disc space labeled the L5-S1 level. Alignment: 2 mm degenerative retrolisthesis of L1 on L2 and L2 on L3, with 3 mm anterolisthesis of L5 on S1. Vertebrae: Abnormal marrow replacement with edema and heterogeneous enhancement seen within the L1 vertebral body, concerning for osseous metastatic disease. Associated mild pathologic compression fracture measures up to 20% without bony retropulsion. No extra osseous or epidural extension of tumor. No other visible metastatic lesions within the lumbar spine. Bone marrow signal intensity otherwise normal. Prominent degenerative reactive endplate changes noted about the L2-3 interspace. No other abnormal marrow edema or enhancement. Conus medullaris and cauda equina: Conus extends to the T12-L1 no abnormal enhancement. Level. Conus and  cauda equina appear normal. Paraspinal and other soft tissues: Changes of acute pyelonephritis involving the left greater than right kidneys noted, described on prior CT. 5.3 cm simple right renal cyst, benign in appearance, no follow-up imaging recommended. Disc levels: T12-L1: Mild diffuse disc bulge with reactive endplate spurring. Mild facet hypertrophy. No spinal stenosis. Foramina remain patent. L1-2: Degenerative intervertebral disc space narrowing with diffuse disc bulge and disc desiccation. Reactive endplate spurring. Mild bilateral facet hypertrophy. Resultant mild narrowing of the lateral recesses bilaterally. Central canal remains patent. Foramina remain adequately patent bilaterally. L2-3: Advanced degenerative intervertebral disc space narrowing with diffuse disc bulge and disc desiccation. Prominent reactive endplate change with marginal endplate osteophytic spurring and marrow edema. Mild to moderate bilateral facet arthrosis. Resultant moderate canal with bilateral subarticular stenosis. Moderate left worse  than right L2 foraminal narrowing. L3-4: Diffuse disc bulge with reactive endplate spurring. Superimposed small left foraminal disc protrusion contacts the exiting left L3 nerve root (series 9, image 12). Mild-to-moderate bilateral facet hypertrophy. Resultant mild narrowing of the lateral recesses bilaterally. Central canal remains patent. Mild to moderate left L3 foraminal stenosis. Right neural foramen remains patent. L4-5: Disc desiccation with diffuse disc bulge. Reactive endplate spurring. Moderate to severe bilateral facet arthrosis with associated small reactive joint effusions. Resultant mild to moderate bilateral subarticular stenosis. Central canal remains patent. Moderate to severe bilateral L4 foraminal narrowing. L5-S1: Anterolisthesis with degenerative intervertebral disc space narrowing. Diffuse disc bulge with reactive endplate spurring. Moderate bilateral facet hypertrophy. No significant spinal stenosis. Moderate bilateral L5 foraminal narrowing. IMPRESSION: 1. Abnormal marrow replacement with heterogeneous enhancement within the L1 vertebral body, concerning for osseous metastatic disease. Associated mild pathologic compression fracture measures up to 20% without bony retropulsion. No extra osseous or epidural extension of tumor. 2. No other evidence for metastatic disease within the lumbar spine. 3. Multilevel lumbar spondylosis with resultant moderate canal stenosis at L2-3. Associated multilevel foraminal narrowing as above, moderate to severe in nature at L4-5 bilaterally. 4. Changes of acute pyelonephritis involving the left greater than right kidneys, described on prior CT. Electronically Signed   By: Rise Mu M.D.   On: 07/28/2023 23:59   MR BRAIN W WO CONTRAST Result Date: 07/28/2023 CLINICAL DATA:  Initial evaluation for brain/CNS neoplasm EXAM: MRI HEAD WITHOUT AND WITH CONTRAST TECHNIQUE: Multiplanar, multiecho pulse sequences of the brain and surrounding structures  were obtained without and with intravenous contrast. CONTRAST:  10mL GADAVIST GADOBUTROL 1 MMOL/ML IV SOLN COMPARISON:  Prior CT from 07/28/2023. FINDINGS: Brain: Cerebral volume within normal limits. Patchy T2/FLAIR hyperintensity involving the periventricular and deep white matter both supra hemispheres, consistent with chronic small vessel ischemic disease, moderately advanced in nature. Punctate acute ischemic nonhemorrhagic infarct present at the right parietal cortex (series 9, image 98). No other evidence for acute or subacute ischemia. Mixed cystic and solid peripherally enhancing mass positioned at the left occipital lobe is seen, measuring 4.0 x 3.5 x 3.7 cm. Mild surrounding vasogenic edema without significant regional mass effect or midline shift. 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 No other visible mass lesions or abnormal enhancement. No hydrocephalus or extra-axial fluid collection. Pituitary gland suprasellar region within normal limits. Vascular: Major intracranial vascular flow voids are maintained. Skull and upper cervical spine: Craniocervical junction within normal limits. Visualized bone marrow signal intensity within normal limits. Postoperative changes noted within the partially visualized upper cervical spine. Sinuses/Orbits: Globes and orbital soft tissues within normal limits. Paranasal sinuses are  largely clear. No significant mastoid effusion. Other: None. IMPRESSION: 1. 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 Mild surrounding vasogenic edema without significant regional mass effect or midline shift. 2. 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 3. Underlying moderately advanced chronic microvascular ischemic disease. Electronically Signed   By: Rise Mu M.D.    On: 07/28/2023 23:48   CT L-SPINE NO CHARGE Result Date: 07/28/2023 CLINICAL DATA:  Back pain after fall December 1st EXAM: CT LUMBAR SPINE WITHOUT CONTRAST TECHNIQUE: Multidetector CT imaging of the lumbar spine was performed without intravenous contrast administration. Multiplanar CT image reconstructions were also generated. RADIATION DOSE REDUCTION: This exam was performed according to the departmental dose-optimization program which includes automated exposure control, adjustment of the mA and/or kV according to patient size and/or use of iterative reconstruction technique. COMPARISON:  CT 04/05/2016 FINDINGS: Segmentation: 5 lumbar type vertebrae. Alignment: No evidence of traumatic listhesis. Mild grade 1 retrolisthesis of L2 is likely chronic. Vertebrae: Mild superior endplate compression of L1, may be chronic though is age indeterminate without recent comparison. Otherwise no evidence of acute fracture. There is mild heterogeneity of the marrow of L1 and metastasis is not excluded. Consider MRI for further evaluation. Paraspinal and other soft tissues: See separate report for findings in the abdomen and pelvis. Disc levels: Multilevel spondylosis and disc space height loss with degenerative endplate changes greatest at L2-L3 where it is advanced. Mild multilevel facet arthropathy. Spinal canal narrowing is greatest at L2-L3 where it is moderate. IMPRESSION: 1. Mild superior endplate compression of L1 may be chronic though is age indeterminate without recent comparison. 2. Heterogeneity of the marrow of L1. This may be related to chronic changes however metastasis is not excluded. Consider MRI for further evaluation. \ 3. Advanced disc space height loss at L2-L3. 4. See separate report for findings in the chest, abdomen, and pelvis. Electronically Signed   By: Minerva Fester M.D.   On: 07/28/2023 18:50   CT CHEST ABDOMEN PELVIS W CONTRAST Result Date: 07/28/2023 CLINICAL DATA:  Altered mental status  for 1 week and back pain after a fall on December 1st. EXAM: CT CHEST, ABDOMEN, AND PELVIS WITH CONTRAST TECHNIQUE: Multidetector CT imaging of the chest, abdomen and pelvis was performed following the standard protocol during bolus administration of intravenous contrast. RADIATION DOSE REDUCTION: This exam was performed according to the departmental dose-optimization program which includes automated exposure control, adjustment of the mA and/or kV according to patient size and/or use of iterative reconstruction technique. CONTRAST:  75mL OMNIPAQUE IOHEXOL 350 MG/ML SOLN COMPARISON:  CT abdomen and pelvis 01/14/2008 and chest radiographs 12/07/2014 FINDINGS: CT CHEST FINDINGS Cardiovascular: Normal heart size. Small pericardial effusion. Pericardial thickening where it abuts the large left suprahilar mass. Coronary artery and aortic atherosclerotic calcification. The left upper lobe pulmonary arteries are not well visualized and may be compressed or occluded by the mass. Mediastinum/Nodes: Large heterogenous mass in the left suprahilar region extending into the left upper lobe. Mediastinal, bilateral hilar, and left axillary lymphadenopathy. For example a subcarinal node measures 2.5 cm (series 3/image 27); 1.3 cm right hilar node on series 3/image 34; and a left axillary node measures 2.2 cm (3/19). Trachea and esophagus are unremarkable. Lungs/Pleura: Large heterogenous mass left upper lobe/suprahilar mass consistent with malignancy measuring 12.1 x 8.7 by 12.1 cm. There are a few small locules of gas in the superior portion of  the mass which may represent cavitation. The left upper lobe bronchus is occluded by the mass. Diffuse bronchial wall thickening in the lingula and left lower lobe with centrilobular micro nodules and tree-in-bud opacities. 1.6 cm subpleural nodule in the anterior lingula (series 4/image 90). Small left pleural effusion. Multiple pulmonary nodules in the right lung consistent with  metastases. For example 12 mm nodule in the posterior right lower lobe (4/128) and 9 mm nodule in the right upper lobe (4/44). Mild emphysema in the right. No right pleural effusion. No pneumothorax. Musculoskeletal: No acute fracture.  No destructive osseous lesion. CT ABDOMEN PELVIS FINDINGS Hepatobiliary: No focal hepatic lesion. Layering sludge in the gallbladder. No biliary dilation or evidence of cholecystitis. Pancreas: Unremarkable. Spleen: Unremarkable. Adrenals/Urinary Tract: Normal adrenal glands. Heterogenous attenuation of the left kidney. Focal geographic hypoattenuation in the posterior right kidney. Mild asymmetric left-greater-than-right perinephric stranding. Findings are compatible with pyelonephritis. No urinary calculi or hydronephrosis. Unremarkable bladder. Stomach/Bowel: Normal caliber large and small bowel without bowel wall thickening. Stomach and appendix are within normal limits. Colonic diverticulosis without diverticulitis. Vascular/Lymphatic: Aortic atherosclerosis. 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 The stent in the right superficial femoral artery is not definitively opacified and may be occluded. No enlarged abdominal or pelvic lymph nodes. Reproductive: No acute abnormality. Other: No free intraperitoneal fluid or air. Musculoskeletal: No acute fracture or destructive osseous lesion in the pelvis. See separate report for findings in the lumbar spine. IMPRESSION: CHEST: 1. Large heterogenous mass in the left upper lobe/suprahilar region consistent with malignancy. 2. Diffuse bronchial wall thickening in the lingula and left lower lobe. Differential considerations include lymphangitic spread of tumor or airway infection/inflammation. 3. Multiple pulmonary nodules in the right lung consistent with metastases. 4. Mediastinal, bilateral hilar, and left axillary lymphadenopathy consistent  with metastases. 5. Small left pleural effusion. ABDOMEN/PELVIS: 1. Bilateral pyelonephritis, left-greater-than-right. 2. The stent in the right superficial femoral artery is not definitively opacified and may be occluded though this is incompletely evaluated on non arterial phase exam. 3. 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 Aortic Atherosclerosis (ICD10-I70.0) and Emphysema (ICD10-J43.9). Electronically Signed   By: Minerva Fester M.D.   On: 07/28/2023 18:16   CT Head Wo Contrast Result Date: 07/28/2023 CLINICAL DATA:  Delirium EXAM: CT HEAD WITHOUT CONTRAST TECHNIQUE: Contiguous axial images were obtained from the base of the skull through the vertex without intravenous contrast. RADIATION DOSE REDUCTION: This exam was performed according to the departmental dose-optimization program which includes automated exposure control, adjustment of the mA and/or kV according to patient size and/or use of iterative reconstruction technique. COMPARISON:  CT of the head December 07, 2014. FINDINGS: Brain: Complex cystic and solid 3.4 cm mass in the left parietal lobe. This lesion has some mild hyperdensity associated with it which could represent mineralization and or hemorrhage. Surrounding edema and mild regional mass effect without midline shift. No evidence of acute large vascular territory infarct, mass occupying acute hemorrhage outside the above lesion, midline shift or hydrocephalus. Vascular: No hyperdense vessel identified. Skull: Normal. Negative for fracture or focal lesion. Sinuses/Orbits: Mostly clear sinuses.  No acute orbital findings. IMPRESSION: 1. Cystic and solid 3.4 cm mass in the left parietal lobe, which is concerning for a metastasis given the findings on the forthcoming CT of the chest/abdomen/pelvis. Recommend MRI of the head with contrast to  further characterize and to assess for other lesions. 2.  Surrounding edema and mild regional mass effect without midline shift. Electronically Signed   By: Feliberto Harts M.D.   On: 07/28/2023 18:02      Signature  -   Susa Raring M.D on 08/01/2023 at 7:38 AM   -  To page go to [email or web address]

## 2023-08-02 ENCOUNTER — Ambulatory Visit: Payer: Medicaid Other | Admitting: Radiation Oncology

## 2023-08-02 ENCOUNTER — Inpatient Hospital Stay (HOSPITAL_COMMUNITY): Payer: Medicaid Other

## 2023-08-02 DIAGNOSIS — D491 Neoplasm of unspecified behavior of respiratory system: Secondary | ICD-10-CM | POA: Insufficient documentation

## 2023-08-02 DIAGNOSIS — C7931 Secondary malignant neoplasm of brain: Secondary | ICD-10-CM | POA: Insufficient documentation

## 2023-08-02 DIAGNOSIS — A419 Sepsis, unspecified organism: Secondary | ICD-10-CM | POA: Diagnosis not present

## 2023-08-02 DIAGNOSIS — C7951 Secondary malignant neoplasm of bone: Secondary | ICD-10-CM | POA: Insufficient documentation

## 2023-08-02 DIAGNOSIS — R6520 Severe sepsis without septic shock: Secondary | ICD-10-CM | POA: Diagnosis not present

## 2023-08-02 DIAGNOSIS — I480 Paroxysmal atrial fibrillation: Secondary | ICD-10-CM | POA: Diagnosis not present

## 2023-08-02 DIAGNOSIS — G9341 Metabolic encephalopathy: Secondary | ICD-10-CM | POA: Diagnosis not present

## 2023-08-02 DIAGNOSIS — R918 Other nonspecific abnormal finding of lung field: Secondary | ICD-10-CM | POA: Diagnosis not present

## 2023-08-02 LAB — COMPREHENSIVE METABOLIC PANEL
ALT: 12 U/L (ref 0–44)
AST: 16 U/L (ref 15–41)
Albumin: 2.1 g/dL — ABNORMAL LOW (ref 3.5–5.0)
Alkaline Phosphatase: 76 U/L (ref 38–126)
Anion gap: 10 (ref 5–15)
BUN: 24 mg/dL — ABNORMAL HIGH (ref 8–23)
CO2: 21 mmol/L — ABNORMAL LOW (ref 22–32)
Calcium: 9.2 mg/dL (ref 8.9–10.3)
Chloride: 101 mmol/L (ref 98–111)
Creatinine, Ser: 0.72 mg/dL (ref 0.61–1.24)
GFR, Estimated: >60 mL/min (ref >60)
Glucose, Bld: 253 mg/dL — ABNORMAL HIGH (ref 70–99)
Potassium: 4.5 mmol/L (ref 3.5–5.1)
Sodium: 132 mmol/L — ABNORMAL LOW (ref 135–145)
Total Bilirubin: 0.5 mg/dL (ref <1.2)
Total Protein: 6 g/dL — ABNORMAL LOW (ref 6.5–8.1)

## 2023-08-02 LAB — CBC WITH DIFFERENTIAL/PLATELET
Abs Immature Granulocytes: 0.1 10*3/uL — ABNORMAL HIGH (ref 0.00–0.07)
Basophils Absolute: 0 10*3/uL (ref 0.0–0.1)
Basophils Relative: 0 %
Eosinophils Absolute: 0 10*3/uL (ref 0.0–0.5)
Eosinophils Relative: 0 %
HCT: 36.2 % — ABNORMAL LOW (ref 39.0–52.0)
Hemoglobin: 11.7 g/dL — ABNORMAL LOW (ref 13.0–17.0)
Immature Granulocytes: 1 %
Lymphocytes Relative: 4 %
Lymphs Abs: 0.7 10*3/uL (ref 0.7–4.0)
MCH: 26.4 pg (ref 26.0–34.0)
MCHC: 32.3 g/dL (ref 30.0–36.0)
MCV: 81.7 fL (ref 80.0–100.0)
Monocytes Absolute: 0.6 10*3/uL (ref 0.1–1.0)
Monocytes Relative: 3 %
Neutro Abs: 18.4 10*3/uL — ABNORMAL HIGH (ref 1.7–7.7)
Neutrophils Relative %: 92 %
Platelets: 370 10*3/uL (ref 150–400)
RBC: 4.43 MIL/uL (ref 4.22–5.81)
RDW: 14.7 % (ref 11.5–15.5)
WBC: 19.9 10*3/uL — ABNORMAL HIGH (ref 4.0–10.5)
nRBC: 0 % (ref 0.0–0.2)

## 2023-08-02 LAB — BRAIN NATRIURETIC PEPTIDE: B Natriuretic Peptide: 148.5 pg/mL — ABNORMAL HIGH (ref 0.0–100.0)

## 2023-08-02 LAB — CULTURE, BLOOD (ROUTINE X 2)
Culture: NO GROWTH
Culture: NO GROWTH
Special Requests: ADEQUATE
Special Requests: ADEQUATE

## 2023-08-02 LAB — GLUCOSE, CAPILLARY
Glucose-Capillary: 319 mg/dL — ABNORMAL HIGH (ref 70–99)
Glucose-Capillary: 274 mg/dL — ABNORMAL HIGH (ref 70–99)
Glucose-Capillary: 354 mg/dL — ABNORMAL HIGH (ref 70–99)

## 2023-08-02 LAB — MAGNESIUM: Magnesium: 2.1 mg/dL (ref 1.7–2.4)

## 2023-08-02 LAB — PHOSPHORUS: Phosphorus: 1.4 mg/dL — ABNORMAL LOW (ref 2.5–4.6)

## 2023-08-02 LAB — SURGICAL PATHOLOGY

## 2023-08-02 MED ORDER — DEXAMETHASONE 4 MG PO TABS
4.0000 mg | ORAL_TABLET | Freq: Every day | ORAL | Status: DC
  Administered 2023-08-02 – 2023-08-05 (×4): 4 mg via ORAL
  Filled 2023-08-02 (×4): qty 1

## 2023-08-02 MED ORDER — INSULIN GLARGINE-YFGN 100 UNIT/ML ~~LOC~~ SOLN
10.0000 [IU] | Freq: Two times a day (BID) | SUBCUTANEOUS | Status: DC
  Administered 2023-08-02 – 2023-08-05 (×6): 10 [IU] via SUBCUTANEOUS
  Filled 2023-08-02 (×8): qty 0.1

## 2023-08-02 MED ORDER — GADOBUTROL 1 MMOL/ML IV SOLN
10.0000 mL | Freq: Once | INTRAVENOUS | Status: AC | PRN
  Administered 2023-08-02: 10 mL via INTRAVENOUS

## 2023-08-02 NOTE — Progress Notes (Signed)
IP PROGRESS NOTE  Subjective:   Cody Pena continues to have discomfort at the left chest.  No new complaint.  Objective: Vital signs in last 24 hours: Blood pressure (!) 148/84, pulse 96, temperature 97.9 F (36.6 C), temperature source Oral, resp. rate 19, height 6\' 3"  (1.905 m), weight 171 lb 4.8 oz (77.7 kg), SpO2 100%.  Intake/Output from previous day: 12/19 0701 - 12/20 0700 In: 773.3 [P.O.:240; I.V.:333.3; IV Piggyback:200] Out: 485 [Urine:485]  Physical Exam:  HEENT: No thrush Lungs: Decreased breath sounds at the left compared to the right chest, no respiratory distress Cardiac: Regular rate and rhythm Abdomen: No hepatosplenomegaly Extremities: No leg edema Neurologic: Alert, follows commands, oriented to place and diagnosis  Lab Results: Recent Labs    08/01/23 0420 08/02/23 0252  WBC 16.6* 19.9*  HGB 12.3* 11.7*  HCT 38.6* 36.2*  PLT 311 370    BMET Recent Labs    08/01/23 0420 08/02/23 0252  NA 135 132*  K 4.7 4.5  CL 103 101  CO2 22 21*  GLUCOSE 157* 253*  BUN 31* 24*  CREATININE 0.69 0.72  CALCIUM 10.0 9.2    No results found for: "CEA1", "CEA", "AYT016", "CA125"  Studies/Results: ECHOCARDIOGRAM LIMITED Result Date: 08/01/2023    ECHOCARDIOGRAM LIMITED REPORT   Patient Name:   Cody Pena Date of Exam: 08/01/2023 Medical Rec #:  [id number]       Height:       75.0 in Accession #:    [id number]      Weight:       171.3 lb Date of Birth:  [birth year]      BSA:          2.055 m Patient Age:    62 years        BP:           122/78 mmHg Patient Gender: M               HR:           97 bpm. Exam Location:  Inpatient Procedure: Limited Echo, Cardiac Doppler, Color Doppler and Intracardiac            Opacification Agent Indications:    CHF I50.21  History:        Patient has prior history of Echocardiogram examinations, most                 recent 07/29/2023.  Sonographer:    Harriette Bouillon RDCS Referring Phys: [id number] PAULA V ROSS IMPRESSIONS  1. LV  function assessment is challenging with atrial arrhythmia. Left ventricular ejection fraction, by estimation, is 40 to 45%. The left ventricle has mildly decreased function. Conclusion(s)/Recommendation(s): Consider repeating study in sinus rhythm. FINDINGS  Left Ventricle: LV function assessment is challenging with atrial arrhythmia. Left ventricular ejection fraction, by estimation, is 40 to 45%. The left ventricle has mildly decreased function. Definity contrast agent was given IV to delineate the left ventricular endocardial borders. LEFT VENTRICLE PLAX 2D LVIDd:         5.90 cm LV PW:         0.90 cm LV IVS:        0.90 cm  Carolan Clines Electronically signed by Carolan Clines Signature Date/Time: 08/01/2023/3:32:30 PM    Final    DG Chest Port 1 View Result Date: 08/01/2023 CLINICAL DATA:  Shortness of breath. EXAM: PORTABLE CHEST 1 VIEW COMPARISON:  July 29, 2023. FINDINGS: Stable cardiomediastinal silhouette. There is again noted large  left upper lobe mass with associated postobstructive atelectasis. Mild left basilar atelectasis and pleural effusion is noted. Multiple right-sided pulmonary nodules are noted consistent with metastatic disease IMPRESSION: Large left upper lobe mass is again noted with associated postobstructive atelectasis consistent with malignancy. Multiple metastatic lesions are noted in right lung. Electronically Signed   By: Lupita Raider M.D.   On: 08/01/2023 12:32    Medications: I have reviewed the patient's current medications.  Assessment/Plan:  Lung cancer 07/28/2023: CTs-large heterogenous mass in the left upper lobe/suprahilar region, diffuse bronchial wall thickening in the lingula and left lower lobe, multiple pulmonary nodules in the right lung, mediastinal, bilateral hilar, left axillary lymphadenopathy, small left pleural effusion 07/28/2023: CT head-3.4 cm left parietal mass with surrounding edema 07/28/2023: MRI brain-mixed cystic/solid peripheral enhancing  mass in the left occipital lobe with mild surrounding vasogenic edema, punctate acute ischemic nonhemorrhagic infarct at the right parietal cortex 07/28/2023: MRI lumbar spine-abnormal marrow replacement at L1 concerning for osseous metastatic disease with associated mild pathologic compression fracture.  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  Pulmonary embolism CT chest 07/29/2023: Small subsegmental embolic burden in the right lower lobe 16 2024-lower extremity Dopplers negative for DVT  3.  Hypercalcemia of malignancy, status post Zometa 07/30/2023 4.  Pain secondary to the left lung mass with probable chest wall invasion and left axillary adenopathy 5.  Atrial fibrillation 6.  Pyelonephritis noted on CT 07/28/2023 and lumbar MRI 07/28/2023 7.  Diabetes 8.  Status post right BKA 7.  Altered mental status secondary to hospital delirium and hypercalcemia  Cody Pena presented with a lung mass, metastatic lung/lymph nodes/bone lesions, and a brain metastasis.  He most likely has non-small cell lung cancer.  The pathology from a bronchoscopic biopsy of the obstructing left upper lobe lesion is pending.  Hypercalcemia has resolved.  His mental status appears significantly improved today.  He indicated he would like to proceed with treatment of the cancer as opposed to comfort care.  We will follow-up on results from the biopsy and make treatment recommendations.  Recommendations: 1.  Follow-up pathology, refer tissue for molecular testing as indicated 2.   Change Decadron to oral dosing taper as recommended by radiation oncology 3.  Continue physical therapy, out of bed as tolerated 4.  Antibiotics for pyelonephritis per the medical  service 5.  Proceed with radiation oncology follow-up to plan brain radiation and consider palliative radiation to the chest 6.   Monitor calcium periodically, no need for daily labs 7.   Outpatient follow-up will be scheduled at the Cancer center.  I will be out until 08/05/2023.  Please call oncology as needed  LOS: 5 days   Thornton Papas, MD   08/02/2023, 10:02 AM

## 2023-08-02 NOTE — Progress Notes (Signed)
Rounding Note    Patient Name: Cody Pena Date of Encounter: 08/02/2023  [facility] Cardiologist:   Subjective   Pt laying in bed  Not as calm as yesterday   Mittens on   Inpatient Medications    Scheduled Meds:  apixaban  5 mg Oral BID   aspirin EC  81 mg Oral Daily   atorvastatin  10 mg Oral QPM   dexamethasone (DECADRON) injection  4 mg Intravenous QHS   digoxin  0.25 mg Oral Daily   insulin aspart  0-20 Units Subcutaneous TID PC & HS   insulin glargine-yfgn  10 Units Subcutaneous Daily   metoprolol tartrate  75 mg Oral BID   nicotine  14 mg Transdermal Daily   QUEtiapine  25 mg Oral BID   Continuous Infusions:  ceFEPime (MAXIPIME) IV 2 g (08/02/23 0901)   PRN Meds: diltiazem, haloperidol lactate, HYDROcodone-acetaminophen, morphine   Vital Signs    Vitals:   08/02/23 0100 08/02/23 0349 08/02/23 0827 08/02/23 0852  BP:  132/70 (!) 148/84 (!) 148/84  Pulse:  88 98 96  Resp:  20 19   Temp: 98.3 F (36.8 C) (!) 97.4 F (36.3 C) 97.9 F (36.6 C)   TempSrc: Oral Oral Oral   SpO2: 100% 100%    Weight:      Height:        Intake/Output Summary (Last 24 hours) at 08/02/2023 1101 Last data filed at 08/02/2023 0700 Gross per 24 hour  Intake 773.33 ml  Output 485 ml  Net 288.33 ml       07/30/2023    9:25 AM 07/29/2023    2:00 AM 07/28/2023    3:18 PM  Last 3 Weights  Weight (lbs) 171 lb 4.8 oz 171 lb 4.8 oz 214 lb  Weight (kg) 77.7 kg 77.7 kg 97.07 kg      Telemetry    Tele with Afib with RVR   He has since converted to SR /ST  (around 1 AM)  - Personally Reviewed  ECG    No new  - Personally Reviewed  Physical Exam   GEN: Sl agitated, mittens on  Neck:  Difficult to assess JVP with beard  Cardiac: RRR  No murmurs   Respiratory: Clear to auscultation anteriorly   GI: Soft, nontender, non-distended  MS: No edema; s/p R BKA    Labs    High Sensitivity Troponin:   Recent Labs  Lab 07/28/23 1548 07/28/23 1756   TROPONINIHS 21* 24*     Chemistry Recent Labs  Lab 07/31/23 0233 08/01/23 0420 08/02/23 0252  NA 136 135 132*  K 4.3 4.7 4.5  CL 100 103 101  CO2 23 22 21*  GLUCOSE 154* 157* 253*  BUN 30* 31* 24*  CREATININE 0.90 0.69 0.72  CALCIUM 11.7* 10.0 9.2  MG 2.0 2.1 2.1  PROT 6.4* 5.8* 6.0*  ALBUMIN 2.4* 2.0* 2.1*  AST 19 14* 16  ALT 12 11 12   ALKPHOS 80 74 76  BILITOT 0.6 0.6 0.5  GFRNONAA >60 >60 >60  ANIONGAP 13 10 10     Lipids No results for input(s): "CHOL", "TRIG", "HDL", "LABVLDL", "LDLCALC", "CHOLHDL" in the last 168 hours.  Hematology Recent Labs  Lab 07/31/23 0233 08/01/23 0420 08/02/23 0252  WBC 19.6* 16.6* 19.9*  RBC 4.78 4.67 4.43  HGB 12.7* 12.3* 11.7*  HCT 40.0 38.6* 36.2*  MCV 83.7 82.7 81.7  MCH 26.6 26.3 26.4  MCHC 31.8 31.9 32.3  RDW 14.6 14.8  14.7  PLT 499* 311 370   Thyroid  Recent Labs  Lab 07/29/23 0422  TSH 1.533    BNP Recent Labs  Lab 07/31/23 0233 08/01/23 0420 08/02/23 0252  BNP 219.9* 117.6* 148.5*    DDimer No results for input(s): "DDIMER" in the last 168 hours.   Radiology    ECHOCARDIOGRAM LIMITED Result Date: 08/01/2023    ECHOCARDIOGRAM LIMITED REPORT   Patient Name:   Cody Pena Date of Exam: 08/01/2023 Medical Rec #:  [id number]       Height:       75.0 in Accession #:    [id number]      Weight:       171.3 lb Date of Birth:  10-Nov-1960      BSA:          2.055 m Patient Age:    62 years        BP:           122/78 mmHg Patient Gender: M               HR:           97 bpm. Exam Location:  Inpatient Procedure: Limited Echo, Cardiac Doppler, Color Doppler and Intracardiac            Opacification Agent Indications:    CHF I50.21  History:        Patient has prior history of Echocardiogram examinations, most                 recent 07/29/2023.  Sonographer:    Harriette Bouillon RDCS Referring Phys: [id number] Tyrese Ficek V Lara Palinkas IMPRESSIONS  1. LV function assessment is challenging with atrial arrhythmia. Left ventricular ejection  fraction, by estimation, is 40 to 45%. The left ventricle has mildly decreased function. Conclusion(s)/Recommendation(s): Consider repeating study in sinus rhythm. FINDINGS  Left Ventricle: LV function assessment is challenging with atrial arrhythmia. Left ventricular ejection fraction, by estimation, is 40 to 45%. The left ventricle has mildly decreased function. Definity contrast agent was given IV to delineate the left ventricular endocardial borders. LEFT VENTRICLE PLAX 2D LVIDd:         5.90 cm LV PW:         0.90 cm LV IVS:        0.90 cm  Carolan Clines Electronically signed by Carolan Clines Signature Date/Time: 08/01/2023/3:32:30 PM    Final    DG Chest Port 1 View Result Date: 08/01/2023 CLINICAL DATA:  Shortness of breath. EXAM: PORTABLE CHEST 1 VIEW COMPARISON:  July 29, 2023. FINDINGS: Stable cardiomediastinal silhouette. There is again noted large left upper lobe mass with associated postobstructive atelectasis. Mild left basilar atelectasis and pleural effusion is noted. Multiple right-sided pulmonary nodules are noted consistent with metastatic disease IMPRESSION: Large left upper lobe mass is again noted with associated postobstructive atelectasis consistent with malignancy. Multiple metastatic lesions are noted in right lung. Electronically Signed   By: Lupita Raider M.D.   On: 08/01/2023 12:32     Cardiac Studies   Echo   07/29/23   1. Left ventricular ejection fraction, by estimation, is 30 to 35%. The  left ventricle has moderately decreased function. The left ventricle  demonstrates global hypokinesis. The left ventricular internal cavity size  was mildly dilated. Left ventricular  diastolic parameters are indeterminate.   2. Right ventricular systolic function is normal. The right ventricular  size is normal. There is normal pulmonary artery systolic pressure. The  estimated right  ventricular systolic pressure is 25.8 mmHg.   3. The mitral valve is normal in structure.  Trivial mitral valve  regurgitation. No evidence of mitral stenosis.   4. The aortic valve is tricuspid. There is mild calcification of the  aortic valve. Aortic valve regurgitation is trivial. No aortic stenosis is  present.   5. The inferior vena cava is dilated in size with <50% respiratory  variability, suggesting right atrial pressure of 15 mmHg.   6. The patient was in atrial fibrillation.    Patient Profile     Cody Pena is a 62 y.o. 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 Thedore Mins.   Assessment & Plan    1  Atrial fibrillation  Paroxysmal   Pt in afib with RVR earlier this week  Has been in SR for the past few days     He is on metoprolol 75 bid and digoxin 0.25 (used for rate control)  Stop dig and follow now that in SR  Continue Eliquis   2  HFrEF    LImited echo with Definity done   Still a difficult study   LVEF is probably  45%   Volume status overall is not bad     Would treat with lasix as needed   3 Oncology  Pulmonary and oncology following for work up of lung mass with possible meds   4  PE   RLL PE   On Eliquis    5  Neuro  Mass and evid of CVA   ON decadron  6  HTN    BP is fair    7  Hypercalcemia   Ca is normal   8  HL  Continue LIpitor     8   DM   A1C 9.9     Will be available as needed    Call for questions if conditions change   Will sign off for now    For questions or updates, please contact [facility] Please consult [email or web address] for contact info under        Signed, Dietrich Pates, MD  08/02/2023, 11:01 AM

## 2023-08-02 NOTE — Progress Notes (Signed)
Mobility Specialist Progress Note;   08/02/23 1355  Mobility  Activity Transferred from chair to bed  Level of Assistance +2 (takes two people)  Assistive Device Other (Comment) (HHA)  Activity Response Tolerated fair  Mobility Referral No  Mobility visit 1 Mobility  Mobility Specialist Start Time (ACUTE ONLY) 1355  Mobility Specialist Stop Time (ACUTE ONLY) 1403  Mobility Specialist Time Calculation (min) (ACUTE ONLY) 8 min   Pt requesting assistance back to bed. NT assisted in transfer requiring ModA +2 via HHA. VSS throughout and no c/o. Pt left in bed with all needs met, alarm on.   Caesar Bookman Mobility Specialist Please contact via SecureChat or [employer] [phone]

## 2023-08-02 NOTE — Progress Notes (Signed)
     Referral previously received for Jaci Carrel for goals of care discussion. Noted most recent palliative in-person assessment dated 08/01/2023 at which time it was recommended to follow from a distance/chart check awaiting pathology results and referrals/recommendations from oncology and radiation oncology.  Chart reviewed for Recent provider notes, vitals, and labs and updates received from RN.   At this time patient appears stable, biopsy results still pending. No plan for in person follow-up today as patient previously wanting results back first. Dr. Jarvis Newcomer aware of palliative plan to shadow for results and recommendations, at which point further GOC conversations can be had with the patient. Please contact the palliative medicine provider on service for any new/urgent needs that require our assistance with this patient.  Thank you for your referral and allowing PMT to assist in Taurian Ley Fizer's care.   Wynne Dust, NP Palliative Medicine Team Phone: [phone]  NO CHARGE

## 2023-08-02 NOTE — Progress Notes (Signed)
Spoke with the patient's nurse, Lafonda Mosses to let her know we would like to get the patient over to the cancer center on Monday 12/23 at 0915 for a radiation planning session.  I asked her to arrange [facility] transportation.  If the patient is stable and able to transfer to [facility] over the weekend that would be ideal.  Lind Covert RN, BSN

## 2023-08-02 NOTE — Inpatient Diabetes Management (Signed)
Inpatient Diabetes Program Recommendations  AACE/ADA: New Consensus Statement on Inpatient Glycemic Control (2015)  Target Ranges:  Prepandial:   less than 140 mg/dL      Peak postprandial:   less than 180 mg/dL (1-2 hours)      Critically ill patients:  140 - 180 mg/dL   Lab Results  Component Value Date   GLUCAP 319 (H) 08/02/2023   HGBA1C 9.9 (H) 07/28/2023    Review of Glycemic Control  Diabetes history: type 2 Outpatient Diabetes medications: Novolog 70/30 10 units BID,(not taking), Actos 15 mg daily (not taking?) Current orders for Inpatient glycemic control: Semglee 10 units daily, Novolog 0-20 units correction scale TID & HS  Inpatient Diabetes Program Recommendations:   Noted that blood sugars have been greater than 300 mg/dl.  Recommend increasing Semglee to 10 units BID if blood sugars continue to be elevated and while on Decadron.  Smith Mince RN BSN CDE Diabetes Coordinator Pager: [phone]  8am-5pm

## 2023-08-02 NOTE — Evaluation (Signed)
Occupational Therapy Evaluation Patient Details Name: Cody Pena MRN: [id number] DOB: 1961-07-05 Today's Date: 08/02/2023   History of Present Illness 62 y.o. male who presents with AMS x1 week and back pain, new diagnosis of cancer, anuerysm, past CVA, brain mass, LUL mass, and PE. Pt had recent fall 12/1.   PMHx:  Covid, anxiety/depression, uncontrolled DM, HTN, PVD, cervical spine fusion, peripheral neuropathy, OA, R BKA   Clinical Impression   Pt needing Mod to Max A for STS and pivot transfers, based on his report he has been getting weaker. Pt was able to properly sequence washing his face sitting in recliner with setup but would need assist for thoroughness. Pt stated he needed to use bathroom after getting to recliner, increased time needed for toileting. OT to continue to progress pt as able, DC plans remain appropriate for SNF.       If plan is discharge home, recommend the following: A lot of help with walking and/or transfers;A lot of help with bathing/dressing/bathroom;Assistance with cooking/housework;Direct supervision/assist for medications management;Help with stairs or ramp for entrance;Assist for transportation;Supervision due to cognitive status    Functional Status Assessment  Patient has had a recent decline in their functional status and demonstrates the ability to make significant improvements in function in a reasonable and predictable amount of time.  Equipment Recommendations  Other (comment) (TBD at next level of care)    Recommendations for Other Services       Precautions / Restrictions Precautions Precautions: Fall Precaution Comments: R BKA 2023 Restrictions Weight Bearing Restrictions Per Provider Order: No      Mobility Bed Mobility Overal bed mobility: Needs Assistance Bed Mobility: Supine to Sit     Supine to sit: Min assist     General bed mobility comments: pt left sitting in recliner, min A to rise trunk    Transfers Overall  transfer level: Needs assistance Equipment used: Ambulation equipment used Transfers: Sit to/from Stand, Bed to chair/wheelchair/BSC Sit to Stand: Mod assist Stand pivot transfers: Mod assist         General transfer comment: Mod A pivot to BSC, Mod A pivot from [facility] to recliner, Max A STS with RW to stand for wiping. stedy used to t/f to recliner. Min A STS from perched position stedy Transfer via Lift Equipment: Stedy    Balance Overall balance assessment: Needs assistance Sitting-balance support: Feet supported, Bilateral upper extremity supported Sitting balance-[name]: Fair     Standing balance support: Bilateral upper extremity supported, During functional activity Standing balance-[name]: Poor                             ADL either performed or assessed with clinical judgement   ADL       Grooming: Sitting;Set up;Wash/dry face (washed face without prompting)                   Toilet Transfer: Moderate assistance;Stand-pivot   Toileting- Clothing Manipulation and Hygiene: Maximal assistance;Sit to/from stand               Vision         Perception         Praxis         Pertinent Vitals/Pain Pain Assessment Pain Assessment: No/denies pain     Extremity/Trunk Assessment             Communication     Cognition Arousal: Alert Behavior During Therapy:  Restless Overall Cognitive Status: Impaired/Different from baseline Area of Impairment: Following commands, Awareness, Problem solving, Safety/judgement                       Following Commands: Follows one step commands with increased time, Follows one step commands inconsistently Safety/Judgement: Decreased awareness of safety Awareness: Intellectual Problem Solving: Slow processing, Difficulty sequencing, Requires verbal cues, Requires tactile cues       General Comments  Bp 124/81(91) seated and reassessed after 3 mins at 137/76(95). Pt with no c/o  dizziness    Exercises     Shoulder Instructions      Home Living                                          Prior Functioning/Environment                          OT Problem List: Decreased strength;Decreased range of motion;Decreased activity tolerance;Impaired balance (sitting and/or standing);Decreased cognition;Decreased safety awareness;Pain      OT Treatment/Interventions: Self-care/ADL training;Therapeutic exercise;Energy conservation;DME and/or AE instruction;Therapeutic activities;Patient/family education;Balance training    OT Goals(Current goals can be found in the care plan section) Acute Rehab OT Goals OT Goal Formulation: Patient unable to participate in goal setting Time For Goal Achievement: 08/13/23 Potential to Achieve Goals: Fair  OT Frequency: Min 1X/week    Co-evaluation              AM-PAC OT "6 Clicks" Daily Activity     Outcome Measure Help from another person eating meals?: Total Help from another person taking care of personal grooming?: A Little Help from another person toileting, which includes using toliet, bedpan, or urinal?: A Lot Help from another person bathing (including washing, rinsing, drying)?: A Lot Help from another person to put on and taking off regular upper body clothing?: A Lot Help from another person to put on and taking off regular lower body clothing?: A Lot 6 Click Score: 12   End of Session Equipment Utilized During Treatment: Other (comment) Antony Salmon) Nurse Communication: Mobility status  Activity Tolerance: Patient tolerated treatment well Patient left: in chair;with call bell/phone within reach;with chair alarm set  OT Visit Diagnosis: Other abnormalities of gait and mobility (R26.89);Unsteadiness on feet (R26.81);Muscle weakness (generalized) (M62.81);Other symptoms and signs involving cognitive function;Pain;Dizziness and giddiness (R42)                Time: 6295-2841 OT Time  Calculation (min): 57 min Charges:  OT General Charges $OT Visit: 1 Visit OT Treatments $Self Care/Home Management : 23-37 mins $Therapeutic Activity: 23-37 mins  08/02/2023  AB, OTR/L  Acute Rehabilitation Services  Office: [phone]   Tristan Schroeder 08/02/2023, 11:20 AM

## 2023-08-02 NOTE — Progress Notes (Addendum)
PROGRESS NOTE                                                                                                                                                                                                             Patient Demographics:    Cody Pena, is a 62 y.o. male, DOB - 1961/03/11, VQQ:[id number]  Outpatient Primary MD for the [facility] is [facility]    LOS - 5  Admit date - 07/28/2023    Chief Complaint  Patient presents with   Altered Mental Status   Hyperglycemia   Back Pain       Brief Narrative (HPI from H&P)    62 y.o. male with medical history significant of peripheral vascular disease s/p BKA, hypertension, insulin-dependent type 2 diabetes, and hyperlipidemia who presents with altered mental status and back pain.   Pt not able to provide much hx as he is lethargic after receiving IV morphine. He has been told of cancer in his imaging and appears depressed. Says his memory has been worse. Feels overwhelmed with his new cancer diagnosis. Denies any current pain although earlier had lower back pain. He is alert and oriented only to self although fully able to follow commands. Tells me he does not want his wife to know of his diagnoses yet.   Further workup in the ER is was suggestive of bilateral pyelonephritis with sepsis, new lung mass suspicious for malignancy with mets to spine, lymph nodes and brain, brain mass, small incidental CVA, PE, A-fib RVR and he was admitted.   Subjective:   Pt has no new complaints, eager to get biopsy results. Denies dyspnea or chest pain.    Assessment  & Plan :    Sepsis (HCC)  -secondary to bilateral pyelonephritis - No signs of obstruction or hydronephrosis, continue empiric antibiotics, cefepime (has allergy listed specifically to ceftriaxone), could challenge with alternative (low suspicion for Pseudomonas and may improve mental status). Blood cultures  negative at 5 days. Urine culture never sent from ED. WBC up, but in setting of steroids, remains afebrile without urinary symptoms. Given upper tract disease, planning 10 days Tx total. Sepsis pathophysiology has resolved.  Paroxysmal atrial fibrillation with RVR (HCC) -[place] vas 2 score of greater than 3.  - Newly diagnosed atrial fibrillation.  Likely related to his dehydration with hypercalcemia, sepsis and  PE.  TSH stable, echo noted with slightly depressed EF, seen by cardiology, was on Cardizem drip, now switched to oral Lopressor by cardiology along with as needed IV Cardizem and IV Lopressor & IV digoxin ordered. - Now in NSR, continue current Tx.   Chronic systolic heart failure EF 35% with global hypokinesis on echocardiogram.  Cardiology on board.  For now supportive care, defer further management to cardiology, not a candidate for invasive testing or procedures due to his metastatic cancer.    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 Long-term prognosis does not appear to be good, pulmonary and oncology following,  - s/p bronchoscopy morning of 07/30/2023 with biopsy, follow biopsy results. These are still pending on 12/20.  Defer further management of malignancy to medical and radiation oncology, case discussed with Dr. Thornton Papas in detail on 08/01/2023.  New treatment provided including radiation treatment will be palliative in nature, based on biopsy results life expectancy could be between few months to few years.  Palliative care also on board.  Brain mass  -MRI confirms brain mass with some vasogenic edema. - Continue decadron, change to oral - Repeat MRI brain per RadOnc today  CVA (cerebral vascular accident) (HCC) - MRI brain imaging resulted demonstrating mixed cystic and solid enhancing mass to the left occiput lobe.  There was also a punctate acute ischemic nonhemorrhagic infarct at the right parietal cortex.   Case discussed with Dr. Pearlean Brownie stroke team on 07/29/2023 in detail, cleared for heparin followed by Eliquis which has been started.  Stroke for practical purposes appears to be incidental in nature.  Acute pulmonary embolism (HCC)   - CTA noted, currently see discussions about heparin with stroke team above.  Heparin without bolus with caution and monitor.  Back pain  -  MRI of the L-spine shows suspicion for possible metastatic malignancy in the spine.  Supportive care for now.  Hypercalcemia: Resolved after hydration, calcitonin, zometa. PTH appropriately suppressed. PTHrP still pending.    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 - Vascular surgery follow-up.  S/P BKA (below knee amputation), right (HCC)  History of PAD, There is question of possible occluded right superior femoral artery stent on CT abdomen/pelvis but patient denies any claudication or pain, monitor clinically.   - Outpatient vascular surgery follow-up.  Hospital acquired metabolic encephalopathy.  Due to being in unfamiliar setting, brain mass, steroid in use, placed on twice daily Seroquel and as needed Haldol, mentation improving, oriented x 3 on 08/01/2023. Improved.  T2DM (type 2 diabetes mellitus) (HCC)  - insulin-dependent T2DM , keep on resistant SSI while on steroids for cerebral edema, Semglee added, increased today  Lab Results  Component Value Date   HGBA1C 9.9 (H) 07/28/2023   CBG (last 3)  Recent Labs    08/01/23 1613 08/01/23 2001 08/02/23 0826  GLUCAP 207* 204* 319*           Condition - Extremely Guarded  Family Communication  : Called daughter [name] [phone]  on 07/29/2023 at 9 AM, no response.  Discussed with her in detail on 08/01/2023 explained to her that any treatment will be palliative in nature depending on the biopsy results could prolong his life for few months to few  years but again we will have to wait for the biopsy results for full prognosis.  Code Status : Full code  Consults  : Pulmonary, oncology, radiation Onc, cardiology, neurology, Pall care   Procedures  :     TTE.  1. Left ventricular ejection fraction, by estimation, is 30 to 35%. The left ventricle has moderately decreased function. The left ventricle demonstrates global hypokinesis. The left ventricular internal cavity size was mildly dilated. Left ventricular diastolic parameters are indeterminate.  2. Right ventricular systolic function is normal. The right ventricular size is normal. There is normal pulmonary artery systolic pressure. The estimated right ventricular systolic pressure is 25.8 mmHg.  3. The mitral valve is normal in structure. Trivial mitral valve regurgitation. No evidence of mitral stenosis.  4. The aortic valve is tricuspid. There is mild calcification of the aortic valve. Aortic valve regurgitation is trivial. No aortic stenosis is present.  5. The inferior vena cava is dilated in size with <50% respiratory variability, suggesting right atrial pressure of 15 mmHg.  6. The patient was in atrial fibrillation  EEG - no seizures  CT chest abdomen pelvis.  CHEST: 1. Large heterogenous mass in the left upper lobe/suprahilar region consistent with malignancy. 2. Diffuse bronchial wall thickening in the lingula and left lower lobe. Differential considerations include lymphangitic spread of tumor or airway infection/inflammation. 3. Multiple pulmonary nodules in the right lung consistent with metastases. 4. Mediastinal, bilateral hilar, and left axillary lymphadenopathy consistent with metastases. 5. Small left pleural effusion. ABDOMEN/PELVIS: 1. Bilateral pyelonephritis, left-greater-than-right. 2. The stent in the right superficial femoral artery is not definitively opacified and may be occluded though this is incompletely evaluated on non arterial phase exam. 3. 2.5 cm right common  iliac artery aneurysm. 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 Aortic Atherosclerosis    CTA chest.   1. Small subsegmental embolic burden in the right lower lobe, with no evidence of right heart strain. 2. Large 12.7 x 9 x 14.6 cm left upper lobe mass with bronchovascular encasement, occluding the left upper lobe bronchus and almost completely occluding the left upper lobe pulmonary artery, and occluding the left superior pulmonary vein. 3. Multiple right lung nodules consistent with metastases. 4. Bilateral bronchial thickening but proportionately greater in the left lower lobe where there is peribronchovascular interstitial and airspace opacities which could indicate changes of bronchopneumonia or lymphangitic carcinomatosis. 5. Small left and minimal right pleural effusions. 6. Multifocal mediastinal, right hilar and left axillary adenopathy. 7. Aortic and coronary artery atherosclerosis. 8. Emphysema. 9. These results will be called to the ordering clinician or representative by the Radiologist Assistant, and communication documented in the PACS or [employer]. Aortic Atherosclerosis  and Emphysema   MR L Spine - 1. Abnormal marrow replacement with heterogeneous enhancement within the L1 vertebral body, concerning for osseous metastatic disease. Associated mild pathologic compression fracture measures up to 20% without bony retropulsion. No extra osseous or epidural extension of tumor. 2. No other evidence for metastatic disease within the lumbar spine. 3. Multilevel lumbar spondylosis with resultant moderate canal stenosis at L2-3. Associated multilevel foraminal narrowing as above, moderate to severe in nature at L4-5 bilaterally. 4. Changes of acute pyelonephritis involving the left greater than right kidneys, described on prior CT.   MRI brain. 1. 4.0 x 3.5 x 3.7 cm mixed cystic and solid peripherally enhancing mass at the left occipital lobe, most  concerning for a solitary intracranial metastasis. Mild surrounding vasogenic edema without significant regional mass effect or midline shift. 2. 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 3. Underlying moderately  advanced chronic microvascular ischemic disease      Disposition Plan  :    Status is: Inpatient  DVT Prophylaxis  :    SCDs Start: 07/28/23 2115 .Marland Kitchen Heparin apixaban (ELIQUIS) tablet 5 mg    Lab Results  Component Value Date   PLT 370 08/02/2023    Diet :  Diet Order             DIET SOFT Fluid consistency: Thin  Diet effective now                    Inpatient Medications  Scheduled Meds:  apixaban  5 mg Oral BID   aspirin EC  81 mg Oral Daily   atorvastatin  10 mg Oral QPM   dexamethasone (DECADRON) injection  4 mg Intravenous QHS   digoxin  0.25 mg Oral Daily   insulin aspart  0-20 Units Subcutaneous TID PC & HS   insulin glargine-yfgn  10 Units Subcutaneous Daily   metoprolol tartrate  75 mg Oral BID   nicotine  14 mg Transdermal Daily   QUEtiapine  25 mg Oral BID   Continuous Infusions:  ceFEPime (MAXIPIME) IV 2 g (08/02/23 0901)   PRN Meds:.diltiazem, haloperidol lactate, HYDROcodone-acetaminophen, morphine    Objective:   Vitals:   08/02/23 0100 08/02/23 0349 08/02/23 0827 08/02/23 0852  BP:  132/70 (!) 148/84 (!) 148/84  Pulse:  88 98 96  Resp:  20 19   Temp: 98.3 F (36.8 C) (!) 97.4 F (36.3 C) 97.9 F (36.6 C)   TempSrc: Oral Oral Oral   SpO2: 100% 100%    Weight:      Height:        Wt Readings from Last 3 Encounters:  07/30/23 77.7 kg  01/06/23 97.5 kg  01/31/22 104.3 kg     Intake/Output Summary (Last 24 hours) at 08/02/2023 0932 Last data filed at 08/02/2023 0700 Gross per 24 hour  Intake 773.33 ml  Output 485 ml  Net 288.33 ml     Physical Exam Gen: No distress Pulm: Clear, nonlabored, no wheezes or crackles  CV: RRR, no MRG or pitting dependent edema GI: Soft, NT, ND,  +BS  Neuro: Alert and oriented. No new focal deficits. Ext: Warm, dry, remote R BKA noted Skin: No new rashes, lesions or ulcers on visualized skin     Data Review:    Recent Labs  Lab 07/28/23 1548 07/29/23 0422 07/30/23 0422 07/31/23 0233 08/01/23 0420 08/02/23 0252  WBC 18.6* 18.7* 18.2* 19.6* 16.6* 19.9*  HGB 13.0 11.3* 11.1* 12.7* 12.3* 11.7*  HCT 40.9 35.7* 35.5* 40.0 38.6* 36.2*  PLT 515* 391 448* 499* 311 370  MCV 83.1 83.2 84.5 83.7 82.7 81.7  MCH 26.4 26.3 26.4 26.6 26.3 26.4  MCHC 31.8 31.7 31.3 31.8 31.9 32.3  RDW 14.3 14.4 14.5 14.6 14.8 14.7  LYMPHSABS 1.6  --  0.8 0.6* 0.5* 0.7  MONOABS 1.3*  --  0.6 1.0 0.6 0.6  EOSABS 0.1  --  0.0 0.0 0.0 0.0  BASOSABS 0.1  --  0.1 0.1 0.1 0.0    Recent Labs  Lab 07/28/23 1553 07/28/23 1801 07/28/23 2304 07/29/23 0422 07/29/23 0942 07/29/23 0942 07/30/23 0422 07/30/23 2326 07/31/23 0233 08/01/23 0420 08/02/23 0252  NA  --   --   --  130*  --   --  131* 136 136 135 132*  K  --   --   --  4.5  --   --  4.3 4.8 4.3 4.7 4.5  CL  --   --   --  97*  --   --  101 101 100 103 101  CO2  --   --   --  24  --   --  22 21* 23 22 21*  ANIONGAP  --   --   --  9  --   --  8 14 13 10 10   GLUCOSE  --   --   --  189*  --   --  236* 153* 154* 157* 253*  BUN  --   --   --  30*  --   --  32* 32* 30* 31* 24*  CREATININE  --   --   --  1.19  --   --  0.90 0.91 0.90 0.69 0.72  AST  --   --   --  15  --   --  19  --  19 14* 16  ALT  --   --   --  11  --   --  13  --  12 11 12   ALKPHOS  --   --   --  85  --   --  82  --  80 74 76  BILITOT  --   --   --  0.6  --   --  0.6  --  0.6 0.6 0.5  ALBUMIN  --   --   --  2.0*  --   --  2.0*  --  2.4* 2.0* 2.1*  CRP  --   --   --   --  18.1*  --  11.0*  --  8.6* 11.3*  --   PROCALCITON  --   --   --   --  <0.10  --  <0.10  --  <0.10 <0.10  --   LATICACIDVEN 3.7* 2.0* 1.6  --   --   --   --   --   --   --   --   TSH  --   --   --  1.533  --   --   --   --   --   --   --   HGBA1C  --   --  9.9*   --   --   --   --   --   --   --   --   BNP  --   --   --   --  250.4*   < > 269.7* 516.6* 219.9* 117.6* 148.5*  MG  --   --   --   --  1.9   < > 2.0 1.9 2.0 2.1 2.1  CALCIUM  --   --   --  12.3*  --   --  11.9* 12.1* 11.7* 10.0 9.2   < > = values in this interval not displayed.      Recent Labs  Lab 07/28/23 1553 07/28/23 1801 07/28/23 2304 07/29/23 0422 07/29/23 4782 07/29/23 9562 07/30/23 0422 07/30/23 2326 07/31/23 0233 08/01/23 0420 08/02/23 0252  CRP  --   --   --   --  18.1*  --  11.0*  --  8.6* 11.3*  --   PROCALCITON  --   --   --   --  <0.10  --  <0.10  --  <0.10 <0.10  --   LATICACIDVEN 3.7* 2.0* 1.6  --   --   --   --   --   --   --   --  TSH  --   --   --  1.533  --   --   --   --   --   --   --   HGBA1C  --   --  9.9*  --   --   --   --   --   --   --   --   BNP  --   --   --   --  250.4*   < > 269.7* 516.6* 219.9* 117.6* 148.5*  MG  --   --   --   --  1.9   < > 2.0 1.9 2.0 2.1 2.1  CALCIUM  --   --   --  12.3*  --   --  11.9* 12.1* 11.7* 10.0 9.2   < > = values in this interval not displayed.    Lab Results  Component Value Date   CHOL 131 07/24/2021   HDL 28 (L) 07/24/2021   LDLCALC 48 07/24/2021   TRIG 273 (H) 07/24/2021   CHOLHDL 4.7 07/24/2021    Lab Results  Component Value Date   HGBA1C 9.9 (H) 07/28/2023     Micro Results Recent Results (from the past 240 hours)  Blood culture (routine x 2)     Status: None   Collection Time: 07/28/23  3:48 PM   Specimen: BLOOD RIGHT ARM  Result Value Ref Range Status   Specimen Description BLOOD RIGHT ARM  Final   Special Requests   Final    BOTTLES DRAWN AEROBIC AND ANAEROBIC Blood Culture adequate volume   Culture   Final    NO GROWTH 5 DAYS Performed at [facility], [address], [place], [place] [address]    Report Status 08/02/2023 FINAL  Final  Blood culture (routine x 2)     Status: None   Collection Time: 07/28/23  3:48 PM   Specimen: BLOOD LEFT ARM  Result Value Ref Range Status    Specimen Description BLOOD LEFT ARM  Final   Special Requests   Final    BOTTLES DRAWN AEROBIC AND ANAEROBIC Blood Culture adequate volume   Culture   Final    NO GROWTH 5 DAYS Performed at [facility], [address], [place], [place] [address]    Report Status 08/02/2023 FINAL  Final    Radiology Reports ECHOCARDIOGRAM LIMITED Result Date: 08/01/2023    ECHOCARDIOGRAM LIMITED REPORT   Patient Name:   JAMIL PIGEON Date of Exam: 08/01/2023 Medical Rec #:  [id number]       Height:       75.0 in Accession #:    [id number]      Weight:       171.3 lb Date of Birth:  04/04/1961      BSA:          2.055 m Patient Age:    62 years        BP:           122/78 mmHg Patient Gender: M               HR:           97 bpm. Exam Location:  Inpatient Procedure: Limited Echo, Cardiac Doppler, Color Doppler and Intracardiac            Opacification Agent Indications:    CHF I50.21  History:        Patient has prior history of Echocardiogram examinations, most  recent 07/29/2023.  Sonographer:    Harriette Bouillon RDCS Referring Phys: [id number] PAULA V ROSS IMPRESSIONS  1. LV function assessment is challenging with atrial arrhythmia. Left ventricular ejection fraction, by estimation, is 40 to 45%. The left ventricle has mildly decreased function. Conclusion(s)/Recommendation(s): Consider repeating study in sinus rhythm. FINDINGS  Left Ventricle: LV function assessment is challenging with atrial arrhythmia. Left ventricular ejection fraction, by estimation, is 40 to 45%. The left ventricle has mildly decreased function. Definity contrast agent was given IV to delineate the left ventricular endocardial borders. LEFT VENTRICLE PLAX 2D LVIDd:         5.90 cm LV PW:         0.90 cm LV IVS:        0.90 cm  Carolan Clines Electronically signed by Carolan Clines Signature Date/Time: 08/01/2023/3:32:30 PM    Final    DG Chest Port 1 View Result Date: 08/01/2023 CLINICAL DATA:  Shortness of breath. EXAM: PORTABLE  CHEST 1 VIEW COMPARISON:  July 29, 2023. FINDINGS: Stable cardiomediastinal silhouette. There is again noted large left upper lobe mass with associated postobstructive atelectasis. Mild left basilar atelectasis and pleural effusion is noted. Multiple right-sided pulmonary nodules are noted consistent with metastatic disease IMPRESSION: Large left upper lobe mass is again noted with associated postobstructive atelectasis consistent with malignancy. Multiple metastatic lesions are noted in right lung. Electronically Signed   By: Lupita Raider M.D.   On: 08/01/2023 12:32   VAS [place] LOWER EXTREMITY VENOUS (DVT) Result Date: 07/29/2023  Lower Venous DVT Study Patient Name:  BRODEY GILLOGLY  Date of Exam:   07/29/2023 Medical Rec #: [id number]        Accession #:    [id number] Date of Birth: [birth year]       Patient Gender: M Patient Age:   22 years Exam Location:  [facility] Procedure:      VAS [place] LOWER EXTREMITY VENOUS (DVT) Referring Phys: CHING TU --------------------------------------------------------------------------------  Indications: Pulmonary embolism.  Risk Factors: Confirmed PE Surgery RT BKA. Anticoagulation: Heparin. Comparison Study: No significant changes seen since prior exam 07/24/21 Performing Technologist: Shona Simpson  Examination Guidelines: A complete evaluation includes B-mode imaging, spectral Doppler, color Doppler, and power Doppler as needed of all accessible portions of each vessel. Bilateral testing is considered an integral part of a complete examination. Limited examinations for reoccurring indications may be performed as noted. The reflux portion of the exam is performed with the patient in reverse Trendelenburg.  +---------+---------------+---------+-----------+----------+--------------+ RIGHT    CompressibilityPhasicitySpontaneityPropertiesThrombus Aging +---------+---------------+---------+-----------+----------+--------------+ CFV      Full           Yes       Yes                                 +---------+---------------+---------+-----------+----------+--------------+ SFJ      Full                                                        +---------+---------------+---------+-----------+----------+--------------+ FV Prox  Full                                                        +---------+---------------+---------+-----------+----------+--------------+  FV Mid   Full                                                        +---------+---------------+---------+-----------+----------+--------------+ FV DistalFull                                                        +---------+---------------+---------+-----------+----------+--------------+ PFV      Full                                                        +---------+---------------+---------+-----------+----------+--------------+ POP      Full           Yes      Yes                                 +---------+---------------+---------+-----------+----------+--------------+ PTV      Full                                                        +---------+---------------+---------+-----------+----------+--------------+ PERO     Full                                                        +---------+---------------+---------+-----------+----------+--------------+   +---------+---------------+---------+-----------+----------+--------------+ LEFT     CompressibilityPhasicitySpontaneityPropertiesThrombus Aging +---------+---------------+---------+-----------+----------+--------------+ CFV      Full           Yes      Yes                                 +---------+---------------+---------+-----------+----------+--------------+ SFJ      Full                                                        +---------+---------------+---------+-----------+----------+--------------+ FV Prox  Full                                                         +---------+---------------+---------+-----------+----------+--------------+ FV Mid   Full                                                        +---------+---------------+---------+-----------+----------+--------------+  FV DistalFull                                                        +---------+---------------+---------+-----------+----------+--------------+ PFV      Full                                                        +---------+---------------+---------+-----------+----------+--------------+ POP      Full           Yes      Yes                                 +---------+---------------+---------+-----------+----------+--------------+ PTV      Full                                                        +---------+---------------+---------+-----------+----------+--------------+ PERO     Full                                                        +---------+---------------+---------+-----------+----------+--------------+     Summary: BILATERAL: - No evidence of deep vein thrombosis seen in the lower extremities, bilaterally. -No evidence of popliteal cyst, bilaterally.   *See table(s) above for measurements and observations. Electronically signed by Gerarda Fraction on 07/29/2023 at 6:50:19 PM.    Final    ECHOCARDIOGRAM COMPLETE Result Date: 07/29/2023    ECHOCARDIOGRAM REPORT   Patient Name:   CRISTOFER RATHGEBER Date of Exam: 07/29/2023 Medical Rec #:  [id number]       Height:       75.0 in Accession #:    [id number]      Weight:       171.3 lb Date of Birth:  Jul 20, 1961      BSA:          2.055 m Patient Age:    62 years        BP:           140/117 mmHg Patient Gender: M               HR:           122 bpm. Exam Location:  Inpatient Procedure: 2D Echo, Cardiac Doppler and Color Doppler Indications:    Atrial Fibrillation I48.91  History:        Patient has no prior history of Echocardiogram examinations.                 Arrythmias:Atrial Fibrillation,  Signs/Symptoms:Dyspnea; Risk                 Factors:Hypertension, Diabetes, Current Smoker and Dyslipidemia.  Sonographer:    Lucendia Herrlich RCS Referring Phys: [id number] CHING T TU  Sonographer Comments: Image acquisition challenging due to uncooperative patient. IMPRESSIONS  1. Left ventricular ejection fraction, by estimation, is 30 to 35%. The left ventricle has moderately decreased function. The left ventricle demonstrates global hypokinesis. The left ventricular internal cavity size was mildly dilated. Left ventricular diastolic parameters are indeterminate.  2. Right ventricular systolic function is normal. The right ventricular size is normal. There is normal pulmonary artery systolic pressure. The estimated right ventricular systolic pressure is 25.8 mmHg.  3. The mitral valve is normal in structure. Trivial mitral valve regurgitation. No evidence of mitral stenosis.  4. The aortic valve is tricuspid. There is mild calcification of the aortic valve. Aortic valve regurgitation is trivial. No aortic stenosis is present.  5. The inferior vena cava is dilated in size with <50% respiratory variability, suggesting right atrial pressure of 15 mmHg.  6. The patient was in atrial fibrillation. FINDINGS  Left Ventricle: Left ventricular ejection fraction, by estimation, is 30 to 35%. The left ventricle has moderately decreased function. The left ventricle demonstrates global hypokinesis. The left ventricular internal cavity size was mildly dilated. There is no left ventricular hypertrophy. Left ventricular diastolic parameters are indeterminate. Right Ventricle: The right ventricular size is normal. No increase in right ventricular wall thickness. Right ventricular systolic function is normal. There is normal pulmonary artery systolic pressure. The tricuspid regurgitant velocity is 1.64 m/s, and  with an assumed right atrial pressure of 15 mmHg, the estimated right ventricular systolic pressure is 25.8 mmHg. Left  Atrium: Left atrial size was normal in size. Right Atrium: Right atrial size was normal in size. Pericardium: There is no evidence of pericardial effusion. Mitral Valve: The mitral valve is normal in structure. There is mild thickening of the mitral valve leaflet(s). Trivial mitral valve regurgitation. No evidence of mitral valve stenosis. Tricuspid Valve: The tricuspid valve is normal in structure. Tricuspid valve regurgitation is trivial. Aortic Valve: The aortic valve is tricuspid. There is mild calcification of the aortic valve. Aortic valve regurgitation is trivial. No aortic stenosis is present. Aortic valve peak gradient measures 9.7 mmHg. Pulmonic Valve: The pulmonic valve was normal in structure. Pulmonic valve regurgitation is not visualized. Aorta: The aortic root is normal in size and structure. Venous: The inferior vena cava is dilated in size with less than 50% respiratory variability, suggesting right atrial pressure of 15 mmHg. IAS/Shunts: No atrial level shunt detected by color flow Doppler.  LEFT VENTRICLE PLAX 2D LVIDd:         5.50 cm   Diastology LVIDs:         4.60 cm   LV e' medial:    9.64 cm/s LV PW:         1.00 cm   LV E/e' medial:  10.8 LV IVS:        0.90 cm   LV e' lateral:   14.10 cm/s LVOT diam:     2.10 cm   LV E/e' lateral: 7.4 LV SV:         62 LV SV Index:   30 LVOT Area:     3.46 cm  RIGHT VENTRICLE             IVC RV S prime:     15.50 cm/s  IVC diam: 2.40 cm TAPSE (M-mode): 2.1 cm LEFT ATRIUM         Index LA diam:    3.90 cm 1.90 cm/m  AORTIC VALVE AV Area (Vmax): 2.73 cm AV Vmax:        155.50 cm/s AV Peak Grad:   9.7 mmHg  LVOT Vmax:      122.67 cm/s LVOT Vmean:     80.267 cm/s LVOT VTI:       0.180 m  AORTA Ao Root diam: 3.70 cm Ao Asc diam:  3.60 cm MITRAL VALVE                TRICUSPID VALVE MV Area (PHT): 5.84 cm     TR Peak grad:   10.8 mmHg MV Decel Time: 130 msec     TR Vmax:        164.00 cm/s MV E velocity: 104.00 cm/s MV A velocity: 70.50 cm/s   SHUNTS MV E/A  ratio:  1.48         Systemic VTI:  0.18 m                             Systemic Diam: 2.10 cm Dalton McleanMD Electronically signed by Wilfred Lacy Signature Date/Time: 07/29/2023/5:47:50 PM    Final    EEG adult Result Date: 07/29/2023 Charlsie Quest, MD     07/29/2023 11:52 AM Patient Name: KAMAI NOWICKI MRN: [id number] Epilepsy Attending: Charlsie Quest Referring Physician/Provider: Caryl Pina, MD Date: 07/29/2023 Duration: 25.56 mins Patient history: 62 yo M presented to the ED yesterday afternoon with a one week history of AMS and back pain. EEG to evaluate for seizure Level of alertness: Awake AEDs during EEG study: None Technical aspects: This EEG study was done with scalp electrodes positioned according to the 10-20 International system of electrode placement. Electrical activity was reviewed with band pass filter of 1-70Hz , sensitivity of 7 uV/mm, display speed of 52mm/sec with a 60Hz  notched filter applied as appropriate. EEG data were recorded continuously and digitally stored.  Video monitoring was available and reviewed as appropriate. Description: No posterior dominant rhythm was seen. EEG showed continuous generalized 3 to 6 Hz theta-delta slowing admixed with 12-14 Hz beta activity distributed symmetrically and diffusely.  Physiologic photic driving was not seen during photic stimulation.  Hyperventilation was not performed.   ABNORMALITY - Continuous slow, generalized IMPRESSION: This study is suggestive of moderate diffuse encephalopathy. No seizures or epileptiform discharges were seen throughout the recording. Charlsie Quest       Signature  -   Tyrone Nine M.D on 08/02/2023 at 9:32 AM   -  To page go to [email or web address]

## 2023-08-02 NOTE — Progress Notes (Signed)
  Radiation Oncology         [phone] ________________________________  Name: Cody Pena MRN: [id number]  Date: 08/05/2023  DOB: 01/31/1961  SIMULATION AND TREATMENT PLANNING NOTE    ICD-10-CM   1. 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  Identity was confirmed.  All relevant records and images related to the planned course of therapy were reviewed.  The patient freely provided informed written consent to proceed with treatment after reviewing the details related to the planned course of therapy. The consent form was witnessed and verified by the simulation staff.  Then, the patient was set-up in a stable reproducible  supine position for radiation therapy.  CT images were obtained.  Surface markings were placed.  The CT images were loaded into the planning software.  Then the target and avoidance structures were contoured.  Treatment planning then occurred.  The radiation prescription was entered and confirmed.  Then, I designed and supervised the construction of a total of 3 medically necessary complex treatment devices consisting of leg positioner and MLC apertures to cover the treated hip area.  I have requested : 3D Simulation  I have requested a DVH of the following structures: heart, spinal cord, lungs, kidneys and target.  PLAN:  The patient will receive 32 Gy in 8 fractions of 4 Gy each to the left lung mass and 28 Gy in 8 fractions of 3.5 Gy each to the painful metastasis at L1.  ________________________________  Artist Pais. Kathrynn Running, M.D.

## 2023-08-03 DIAGNOSIS — E785 Hyperlipidemia, unspecified: Secondary | ICD-10-CM | POA: Diagnosis not present

## 2023-08-03 DIAGNOSIS — A419 Sepsis, unspecified organism: Secondary | ICD-10-CM | POA: Diagnosis not present

## 2023-08-03 DIAGNOSIS — I480 Paroxysmal atrial fibrillation: Secondary | ICD-10-CM | POA: Diagnosis not present

## 2023-08-03 DIAGNOSIS — G9389 Other specified disorders of brain: Secondary | ICD-10-CM | POA: Diagnosis not present

## 2023-08-03 LAB — CBC WITH DIFFERENTIAL/PLATELET
Abs Immature Granulocytes: 0.13 10*3/uL — ABNORMAL HIGH (ref 0.00–0.07)
Basophils Absolute: 0 10*3/uL (ref 0.0–0.1)
Basophils Relative: 0 %
Eosinophils Absolute: 0 10*3/uL (ref 0.0–0.5)
Eosinophils Relative: 0 %
HCT: 37.9 % — ABNORMAL LOW (ref 39.0–52.0)
Hemoglobin: 11.9 g/dL — ABNORMAL LOW (ref 13.0–17.0)
Immature Granulocytes: 1 %
Lymphocytes Relative: 6 %
Lymphs Abs: 1.1 10*3/uL (ref 0.7–4.0)
MCH: 26.6 pg (ref 26.0–34.0)
MCHC: 31.4 g/dL (ref 30.0–36.0)
MCV: 84.8 fL (ref 80.0–100.0)
Monocytes Absolute: 1.1 10*3/uL — ABNORMAL HIGH (ref 0.1–1.0)
Monocytes Relative: 6 %
Neutro Abs: 16.7 10*3/uL — ABNORMAL HIGH (ref 1.7–7.7)
Neutrophils Relative %: 87 %
Platelets: 340 10*3/uL (ref 150–400)
RBC: 4.47 MIL/uL (ref 4.22–5.81)
RDW: 15.1 % (ref 11.5–15.5)
WBC: 19.1 10*3/uL — ABNORMAL HIGH (ref 4.0–10.5)
nRBC: 0 % (ref 0.0–0.2)

## 2023-08-03 LAB — GLUCOSE, CAPILLARY
Glucose-Capillary: 177 mg/dL — ABNORMAL HIGH (ref 70–99)
Glucose-Capillary: 170 mg/dL — ABNORMAL HIGH (ref 70–99)
Glucose-Capillary: 197 mg/dL — ABNORMAL HIGH (ref 70–99)
Glucose-Capillary: 206 mg/dL — ABNORMAL HIGH (ref 70–99)

## 2023-08-03 LAB — COMPREHENSIVE METABOLIC PANEL
ALT: 14 U/L (ref 0–44)
AST: 21 U/L (ref 15–41)
Albumin: 2.6 g/dL — ABNORMAL LOW (ref 3.5–5.0)
Alkaline Phosphatase: 73 U/L (ref 38–126)
Anion gap: 11 (ref 5–15)
BUN: 21 mg/dL (ref 8–23)
CO2: 20 mmol/L — ABNORMAL LOW (ref 22–32)
Calcium: 8.8 mg/dL — ABNORMAL LOW (ref 8.9–10.3)
Chloride: 100 mmol/L (ref 98–111)
Creatinine, Ser: 0.49 mg/dL — ABNORMAL LOW (ref 0.61–1.24)
GFR, Estimated: >60 mL/min (ref >60)
Glucose, Bld: 184 mg/dL — ABNORMAL HIGH (ref 70–99)
Potassium: 4.1 mmol/L (ref 3.5–5.1)
Sodium: 131 mmol/L — ABNORMAL LOW (ref 135–145)
Total Bilirubin: 0.5 mg/dL (ref <1.2)
Total Protein: 6.9 g/dL (ref 6.5–8.1)

## 2023-08-03 MED ORDER — MORPHINE SULFATE (PF) 2 MG/ML IV SOLN
2.0000 mg | INTRAVENOUS | Status: DC | PRN
  Administered 2023-08-03 (×2): 4 mg via INTRAVENOUS
  Administered 2023-08-04: 2 mg via INTRAVENOUS
  Administered 2023-08-04: 4 mg via INTRAVENOUS
  Administered 2023-08-05: 2 mg via INTRAVENOUS
  Filled 2023-08-03 (×2): qty 2
  Filled 2023-08-03: qty 1
  Filled 2023-08-03 (×2): qty 2

## 2023-08-03 MED ORDER — SODIUM PHOSPHATES 45 MMOLE/15ML IV SOLN
45.0000 mmol | Freq: Once | INTRAVENOUS | Status: AC
  Administered 2023-08-03: 45 mmol via INTRAVENOUS
  Filled 2023-08-03: qty 15

## 2023-08-03 NOTE — Progress Notes (Signed)
Noted on chest [facility]  PROGRESS NOTE  Cody Pena WUJ:[id number] DOB: 1961-05-12 DOA: 07/28/2023 [facility]   Brief HPI:   62 y.o. male with medical history significant of peripheral vascular disease s/p BKA, hypertension, insulin-dependent type 2 diabetes, and hyperlipidemia who presents with altered mental status and back pain.   Pt not able to provide much hx as he is lethargic after receiving IV morphine. He has been told of cancer in his imaging and appears depressed r workup in the ER is was suggestive of bilateral pyelonephritis with sepsis, new lung mass suspicious for malignancy with mets to spine, lymph nodes and brain, brain mass, small incidental CVA, PE, A-fib RVR    Assessment/Plan:   Sepsis (HCC)   -secondary to bilateral pyelonephritis  - No signs of obstruction or hydronephrosis, continue empiric antibiotics, cefepime  - Blood cultures negative at 5 days.  -Urine culture never sent from ED. WBC up, but in setting of steroids, remains afebrile without urinary symptoms.  -Sepsis physiology has resolved    Paroxysmal atrial fibrillation with RVR (HCC)  -[place] vasc 2 score of greater than 3.   - Newly diagnosed atrial fibrillation.   -Continue metoprolol 75 mg p.o. twice daily; digoxin started by cardiology as patient is now in sinus rhythm -Continue Eliquis     Chronic systolic heart failure EF 35% with global hypokinesis on echocardiogram.   -Echocardiogram done, EF 45% -Cardiology following, plan to treat with Lasix as needed     Left lung mass suspicious for primary lung malignancy with possible mets to spine, lymph nodes, brain. Long-term prognosis does not appear to be good, pulmonary and oncology following,  - s/p bronchoscopy morning of 07/30/2023 with biopsy, follow biopsy results. These are still pending on 12/20.  Defer further management of malignancy to medical and radiation oncology, case discussed with Dr. Thornton Papas  in detail on 08/01/2023.  New treatment provided including radiation treatment will be palliative in nature, based on biopsy results life expectancy could be between few months to few years.  Palliative care also on board.   Brain mass  -MRI confirms brain mass with some vasogenic edema. - Continue decadron, change to oral - Repeat MRI brain per RadOnc today   CVA (cerebral vascular accident) (HCC) - MRI brain imaging resulted demonstrating mixed cystic and solid enhancing mass to the left occiput lobe.  There was also a punctate acute ischemic nonhemorrhagic infarct at the right parietal cortex.  Case discussed with Dr. Pearlean Brownie stroke team on 07/29/2023 in detail, cleared for heparin followed by Eliquis which has been started.  Stroke for practical purposes appears to be incidental in nature.   Acute pulmonary embolism (HCC)  -Seen on CTA chest -Heparin switched to Eliquis     Back pain   -  MRI of the L-spine shows suspicion for possible metastatic malignancy in the spine.  Supportive care for now.   Hypercalcemia: Resolved after hydration, calcitonin, zometa. PTH appropriately suppressed.  PTHrP still pending.     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 - Vascular surgery follow-up.   S/P BKA (below knee amputation), right (HCC)  History of PAD, There is question of possible occluded right superior femoral artery stent on CT abdomen/pelvis but patient denies any claudication or pain, monitor clinically.   - Outpatient vascular surgery follow-up.   Hospital acquired metabolic encephalopathy.  Due to being in  unfamiliar setting, brain mass, steroid in use, placed on twice daily Seroquel and as needed Haldol, mentation improving, oriented x 3 on 08/01/2023. Improved.   T2DM (type 2 diabetes mellitus) (HCC)  - insulin-dependent T2DM , keep on resistant SSI while on steroids for cerebral  edema, continue Semglee -CBG has been elevated yesterday, blood glucose is better today -Will follow        Medications     apixaban  5 mg Oral BID   aspirin EC  81 mg Oral Daily   atorvastatin  10 mg Oral QPM   dexamethasone  4 mg Oral Daily   insulin aspart  0-20 Units Subcutaneous TID PC & HS   insulin glargine-yfgn  10 Units Subcutaneous BID   metoprolol tartrate  75 mg Oral BID   nicotine  14 mg Transdermal Daily   QUEtiapine  25 mg Oral BID     Data Reviewed:   CBG:  Recent Labs  Lab 08/01/23 2001 08/02/23 0826 08/02/23 1225 08/02/23 2106 08/03/23 0808  GLUCAP 204* 319* 274* 354* 177*    SpO2: 95 % O2 Flow Rate (L/min): 2 L/min    Vitals:   08/02/23 2122 08/03/23 0027 08/03/23 0031 08/03/23 0548  BP: (!) 151/81 138/83  135/79  Pulse: (!) 101 84  78  Resp: (!) 25 20  14   Temp:  (!) 97.5 F (36.4 C)  (!) 97.5 F (36.4 C)  TempSrc:  Oral  Oral  SpO2:  94%  95%  Weight:   80.5 kg   Height:   6\' 4"  (1.93 m)       Data Reviewed:  Basic Metabolic Panel: Recent Labs  Lab 07/30/23 0422 07/30/23 2326 07/31/23 0233 08/01/23 0420 08/02/23 0252  NA 131* 136 136 135 132*  K 4.3 4.8 4.3 4.7 4.5  CL 101 101 100 103 101  CO2 22 21* 23 22 21*  GLUCOSE 236* 153* 154* 157* 253*  BUN 32* 32* 30* 31* 24*  CREATININE 0.90 0.91 0.90 0.69 0.72  CALCIUM 11.9* 12.1* 11.7* 10.0 9.2  MG 2.0 1.9 2.0 2.1 2.1  PHOS 3.7  --  2.3* 2.2* 1.4*    CBC: Recent Labs  Lab 07/28/23 1548 07/29/23 0422 07/30/23 0422 07/31/23 0233 08/01/23 0420 08/02/23 0252  WBC 18.6* 18.7* 18.2* 19.6* 16.6* 19.9*  NEUTROABS 15.4*  --  16.6* 17.8* 15.3* 18.4*  HGB 13.0 11.3* 11.1* 12.7* 12.3* 11.7*  HCT 40.9 35.7* 35.5* 40.0 38.6* 36.2*  MCV 83.1 83.2 84.5 83.7 82.7 81.7  PLT 515* 391 448* 499* 311 370    LFT Recent Labs  Lab 07/29/23 0422 07/30/23 0422 07/31/23 0233 08/01/23 0420 08/02/23 0252  AST 15 19 19  14* 16  ALT 11 13 12 11 12   ALKPHOS 85 82 80 74 76   BILITOT 0.6 0.6 0.6 0.6 0.5  PROT 6.5 6.0* 6.4* 5.8* 6.0*  ALBUMIN 2.0* 2.0* 2.4* 2.0* 2.1*     Antibiotics: Anti-infectives (From admission, onward)    Start     Dose/Rate Route Frequency Ordered Stop   07/29/23 0200  ceFEPIme (MAXIPIME) 2 g in sodium chloride 0.9 % 100 mL IVPB        2 g 200 mL/hr over 30 Minutes Intravenous Every 8 hours 07/28/23 2125 08/07/23 2359   07/28/23 1830  vancomycin (VANCOCIN) IVPB 1000 mg/200 mL premix       Placed in "Followed by" Linked Group   1,000 mg 200 mL/hr over 60 Minutes Intravenous  Once 07/28/23 1719 07/28/23 2121  07/28/23 1730  ceFEPIme (MAXIPIME) 2 g in sodium chloride 0.9 % 100 mL IVPB        2 g 200 mL/hr over 30 Minutes Intravenous  Once 07/28/23 1719 07/28/23 1850   07/28/23 1730  vancomycin (VANCOCIN) IVPB 1000 mg/200 mL premix       Placed in "Followed by" Linked Group   1,000 mg 200 mL/hr over 60 Minutes Intravenous  Once 07/28/23 1719 07/28/23 2013        DVT prophylaxis: Apixaban  Code Status: Full code  Family Communication: No family at bedside   CONSULTS    Subjective   Denies pain   Objective    Physical Examination:  General-appears in no acute distress Heart-S1-S2, regular, no murmur auscultated Lungs-clear to auscultation bilaterally, no wheezing or crackles auscultated Abdomen-soft, nontender, no organomegaly Extremities-right BKA Neuro-alert, oriented x3, no focal deficit noted   Status is: Inpatient:             Meredeth Ide   [facility] If 7PM-7AM, please contact night-coverage at [email or web address], Office  [phone]   08/03/2023, 9:11 AM  LOS: 6 days

## 2023-08-03 NOTE — Progress Notes (Signed)
Received patient at 0000 from [facility];report from RN at [facility] earlier; pt alert but pleasantly confused; INT intact and patent; pt on room air; remote telemetry box applied and central tele informed of pt .

## 2023-08-03 NOTE — Plan of Care (Signed)
  Problem: Education: Goal: Ability to describe self-care measures that may prevent or decrease complications (Diabetes Survival Skills Education) will improve Outcome: Progressing Goal: Individualized Educational Video(s) Outcome: Progressing   Problem: Coping: Goal: Ability to adjust to condition or change in health will improve Outcome: Progressing   Problem: Fluid Volume: Goal: Ability to maintain a balanced intake and output will improve Outcome: Progressing   Problem: Health Behavior/Discharge Planning: Goal: Ability to identify and utilize available resources and services will improve Outcome: Progressing Goal: Ability to manage health-related needs will improve Outcome: Progressing   Problem: Nutritional: Goal: Maintenance of adequate nutrition will improve Outcome: Progressing Goal: Progress toward achieving an optimal weight will improve Outcome: Progressing   Problem: Skin Integrity: Goal: Risk for impaired skin integrity will decrease Outcome: Progressing   Problem: Tissue Perfusion: Goal: Adequacy of tissue perfusion will improve Outcome: Progressing   Problem: Education: Goal: Knowledge of General Education information will improve Description: Including pain rating scale, medication(s)/side effects and non-pharmacologic comfort measures Outcome: Progressing   Problem: Health Behavior/Discharge Planning: Goal: Ability to manage health-related needs will improve Outcome: Progressing   Problem: Clinical Measurements: Goal: Ability to maintain clinical measurements within normal limits will improve Outcome: Progressing Goal: Will remain free from infection Outcome: Progressing Goal: Diagnostic test results will improve Outcome: Progressing Goal: Respiratory complications will improve Outcome: Progressing Goal: Cardiovascular complication will be avoided Outcome: Progressing   Problem: Activity: Goal: Risk for activity intolerance will  decrease Outcome: Progressing   Problem: Nutrition: Goal: Adequate nutrition will be maintained Outcome: Progressing   Problem: Coping: Goal: Level of anxiety will decrease Outcome: Progressing   Problem: Elimination: Goal: Will not experience complications related to bowel motility Outcome: Progressing Goal: Will not experience complications related to urinary retention Outcome: Progressing   Problem: Pain Management: Goal: General experience of comfort will improve Outcome: Progressing   Problem: Safety: Goal: Ability to remain free from injury will improve Outcome: Progressing   Problem: Skin Integrity: Goal: Risk for impaired skin integrity will decrease Outcome: Progressing

## 2023-08-03 NOTE — Plan of Care (Signed)
  Problem: Coping: Goal: Ability to adjust to condition or change in health will improve Outcome: Progressing   Problem: Fluid Volume: Goal: Ability to maintain a balanced intake and output will improve Outcome: Progressing   Problem: Nutritional: Goal: Maintenance of adequate nutrition will improve Outcome: Progressing   Problem: Clinical Measurements: Goal: Ability to maintain clinical measurements within normal limits will improve Outcome: Progressing Goal: Will remain free from infection Outcome: Progressing Goal: Diagnostic test results will improve Outcome: Progressing Goal: Respiratory complications will improve Outcome: Progressing Goal: Cardiovascular complication will be avoided Outcome: Progressing   Problem: Activity: Goal: Risk for activity intolerance will decrease Outcome: Progressing   Problem: Nutrition: Goal: Adequate nutrition will be maintained Outcome: Progressing   Problem: Coping: Goal: Level of anxiety will decrease Outcome: Progressing   Problem: Elimination: Goal: Will not experience complications related to bowel motility Outcome: Progressing Goal: Will not experience complications related to urinary retention Outcome: Progressing   Problem: Pain Management: Goal: General experience of comfort will improve Outcome: Progressing   Problem: Safety: Goal: Ability to remain free from injury will improve Outcome: Progressing   Problem: Skin Integrity: Goal: Risk for impaired skin integrity will decrease Outcome: Progressing   Problem: Safety: Goal: Non-violent Restraint(s) Outcome: Progressing

## 2023-08-04 DIAGNOSIS — G9389 Other specified disorders of brain: Secondary | ICD-10-CM | POA: Diagnosis not present

## 2023-08-04 DIAGNOSIS — A419 Sepsis, unspecified organism: Secondary | ICD-10-CM | POA: Diagnosis not present

## 2023-08-04 DIAGNOSIS — I480 Paroxysmal atrial fibrillation: Secondary | ICD-10-CM | POA: Diagnosis not present

## 2023-08-04 DIAGNOSIS — E785 Hyperlipidemia, unspecified: Secondary | ICD-10-CM | POA: Diagnosis not present

## 2023-08-04 LAB — COMPREHENSIVE METABOLIC PANEL
ALT: 16 U/L (ref 0–44)
AST: 21 U/L (ref 15–41)
Albumin: 2.6 g/dL — ABNORMAL LOW (ref 3.5–5.0)
Alkaline Phosphatase: 79 U/L (ref 38–126)
Anion gap: 12 (ref 5–15)
BUN: 19 mg/dL (ref 8–23)
CO2: 20 mmol/L — ABNORMAL LOW (ref 22–32)
Calcium: 8.6 mg/dL — ABNORMAL LOW (ref 8.9–10.3)
Chloride: 99 mmol/L (ref 98–111)
Creatinine, Ser: 0.61 mg/dL (ref 0.61–1.24)
GFR, Estimated: >60 mL/min (ref >60)
Glucose, Bld: 161 mg/dL — ABNORMAL HIGH (ref 70–99)
Potassium: 4.3 mmol/L (ref 3.5–5.1)
Sodium: 131 mmol/L — ABNORMAL LOW (ref 135–145)
Total Bilirubin: 0.4 mg/dL (ref <1.2)
Total Protein: 7 g/dL (ref 6.5–8.1)

## 2023-08-04 LAB — CBC
HCT: 40.6 % (ref 39.0–52.0)
Hemoglobin: 12.5 g/dL — ABNORMAL LOW (ref 13.0–17.0)
MCH: 26.5 pg (ref 26.0–34.0)
MCHC: 30.8 g/dL (ref 30.0–36.0)
MCV: 86 fL (ref 80.0–100.0)
Platelets: 370 10*3/uL (ref 150–400)
RBC: 4.72 MIL/uL (ref 4.22–5.81)
RDW: 15.1 % (ref 11.5–15.5)
WBC: 18.8 10*3/uL — ABNORMAL HIGH (ref 4.0–10.5)
nRBC: 0 % (ref 0.0–0.2)

## 2023-08-04 LAB — PTH-RELATED PEPTIDE: PTH-related peptide: <2 pmol/L

## 2023-08-04 LAB — GLUCOSE, CAPILLARY
Glucose-Capillary: 156 mg/dL — ABNORMAL HIGH (ref 70–99)
Glucose-Capillary: 99 mg/dL (ref 70–99)
Glucose-Capillary: 178 mg/dL — ABNORMAL HIGH (ref 70–99)
Glucose-Capillary: 171 mg/dL — ABNORMAL HIGH (ref 70–99)

## 2023-08-04 LAB — PHOSPHORUS: Phosphorus: 1.6 mg/dL — ABNORMAL LOW (ref 2.5–4.6)

## 2023-08-04 MED ORDER — SODIUM PHOSPHATES 45 MMOLE/15ML IV SOLN
45.0000 mmol | Freq: Once | INTRAVENOUS | Status: AC
  Administered 2023-08-04: 45 mmol via INTRAVENOUS
  Filled 2023-08-04: qty 15

## 2023-08-04 MED ORDER — METHOCARBAMOL 500 MG PO TABS
500.0000 mg | ORAL_TABLET | Freq: Four times a day (QID) | ORAL | Status: AC
  Administered 2023-08-04 – 2023-08-05 (×6): 500 mg via ORAL
  Filled 2023-08-04 (×6): qty 1

## 2023-08-04 NOTE — Plan of Care (Signed)
  Problem: Metabolic: Goal: Ability to maintain appropriate glucose levels will improve Outcome: Progressing   Problem: Skin Integrity: Goal: Risk for impaired skin integrity will decrease Outcome: Progressing   Problem: Clinical Measurements: Goal: Diagnostic test results will improve Outcome: Progressing Goal: Respiratory complications will improve Outcome: Progressing Goal: Cardiovascular complication will be avoided Outcome: Progressing   Problem: Coping: Goal: Level of anxiety will decrease Outcome: Progressing   Problem: Pain Management: Goal: General experience of comfort will improve Outcome: Progressing   Problem: Safety: Goal: Ability to remain free from injury will improve Outcome: Progressing

## 2023-08-04 NOTE — Progress Notes (Incomplete Revision)
Noted on chest triad Hospitalist  PROGRESS NOTE  KAYSEAN LITTLETON ZHY:[id number] DOB: 06-03-1961 DOA: 07/28/2023 [facility]CP: [facility]   Brief HPI:   62 y.o. male with medical history significant of peripheral vascular disease s/p BKA, hypertension, insulin-dependent type 2 diabetes, and hyperlipidemia who presents with altered mental status and back pain.   Pt not able to provide much hx as he is lethargic after receiving IV morphine. He has been told of cancer in his imaging and appears depressed r workup in the ER is was suggestive of bilateral pyelonephritis with sepsis, new lung mass suspicious for malignancy with mets to spine, lymph nodes and brain, brain mass, small incidental CVA, PE, A-fib RVR    Assessment/Plan:   Sepsis (HCC)   -secondary to bilateral pyelonephritis  - No signs of obstruction or hydronephrosis, continue empiric antibiotics, cefepime till 12/25 - Blood cultures negative at 5 days.  -Urine culture never sent from ED. WBC up, but in setting of steroids, remains afebrile without urinary symptoms.  -Sepsis physiology has resolved    Paroxysmal atrial fibrillation with RVR (HCC)  -[place] vasc 2 score of greater than 3.   - Newly diagnosed atrial fibrillation.   -Continue metoprolol 75 mg p.o. twice daily; digoxin started by cardiology as patient is now in sinus rhythm -Continue Eliquis     Chronic systolic heart failure EF 35% with global hypokinesis on echocardiogram.   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 Long-term prognosis does not appear to be good, pulmonary and oncology following,  - s/p bronchoscopy morning of 07/30/2023 with biopsy, follow biopsy results. These are still pending on 12/20.  Defer further management of malignancy to medical and radiation oncology, case discussed with Dr. Thornton Papas in detail on  08/01/2023.  New treatment provided including radiation treatment will be palliative in nature, based on biopsy results life expectancy could be between few months to few years.  Palliative care also on board.   Brain mass  -MRI confirms brain mass with some vasogenic edema. - Continue decadron, change to oral - Repeat MRI brain per RadOnc today   CVA (cerebral vascular accident) (HCC) - MRI brain imaging resulted demonstrating mixed cystic and solid enhancing mass to the left occiput lobe.  There was also a punctate acute ischemic nonhemorrhagic infarct at the right parietal cortex.  Case discussed with Dr. Pearlean Brownie stroke team on 07/29/2023 in detail, cleared for heparin followed by Eliquis which has been started.  Stroke for practical purposes appears to be incidental in nature.   Acute pulmonary embolism (HCC)  -Seen on CTA chest -Heparin switched to Eliquis     Back pain   -  MRI of the L-spine shows suspicion for possible metastatic malignancy in the spine.  Supportive care for now. -Start Robaxin 500 mg p.o. every 6 hours for 6 doses   Hypercalcemia: Resolved after hydration, calcitonin, zometa. PTH appropriately suppressed.  PTHrP still pending.     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 - Vascular surgery follow-up.   S/P BKA (below knee amputation), right (HCC)  History of PAD, There is question of possible occluded right superior femoral artery stent on CT abdomen/pelvis but patient denies any claudication or pain, monitor clinically.   - Outpatient vascular surgery follow-up.  Hospital acquired metabolic encephalopathy.  Due to being in unfamiliar setting, brain mass, steroid in use, placed on twice daily Seroquel and as needed Haldol, mentation improving, oriented x 3 on 08/01/2023. Improved.   T2DM (type 2 diabetes mellitus) (HCC)  - insulin-dependent T2DM , keep on  resistant SSI while on steroids for cerebral edema, continue Semglee -CBG has been elevated yesterday, blood glucose is better today -Will follow    Hypophosphatemia -Replace phosphorus and check phosphorus level in a.m.    Medications     apixaban  5 mg Oral BID   aspirin EC  81 mg Oral Daily   atorvastatin  10 mg Oral QPM   dexamethasone  4 mg Oral Daily   insulin aspart  0-20 Units Subcutaneous TID PC & HS   insulin glargine-yfgn  10 Units Subcutaneous BID   metoprolol tartrate  75 mg Oral BID   nicotine  14 mg Transdermal Daily   QUEtiapine  25 mg Oral BID     Data Reviewed:   CBG:  Recent Labs  Lab 08/03/23 0808 08/03/23 1235 08/03/23 1619 08/03/23 2225 08/04/23 0745  GLUCAP 177* 170* 197* 206* 156*    SpO2: 98 % O2 Flow Rate (L/min): 2 L/min    Vitals:   08/03/23 1353 08/03/23 2037 08/04/23 0525 08/04/23 0940  BP: 130/76 (!) 145/80 (!) 140/79 (!) 150/94  Pulse: 77 87 83 97  Resp: 20 18 17    Temp:  (!) 97.5 F (36.4 C) (!) 97.5 F (36.4 C)   TempSrc:  Oral Oral   SpO2: 97% 95% 98%   Weight:      Height:          Data Reviewed:  Basic Metabolic Panel: Recent Labs  Lab 07/30/23 0422 07/30/23 2326 07/31/23 0233 08/01/23 0420 08/02/23 0252 08/04/23 0700  NA 131* 136 136 135 132* 131*  K 4.3 4.8 4.3 4.7 4.5 4.3  CL 101 101 100 103 101 99  CO2 22 21* 23 22 21* 20*  GLUCOSE 236* 153* 154* 157* 253* 161*  BUN 32* 32* 30* 31* 24* 19  CREATININE 0.90 0.91 0.90 0.69 0.72 0.61  CALCIUM 11.9* 12.1* 11.7* 10.0 9.2 8.6*  MG 2.0 1.9 2.0 2.1 2.1  --   PHOS 3.7  --  2.3* 2.2* 1.4* 1.6*    CBC: Recent Labs  Lab 07/30/23 0422 07/31/23 0233 08/01/23 0420 08/02/23 0252 08/03/23 1057 08/04/23 0700  WBC 18.2* 19.6* 16.6* 19.9* 19.1* 18.8*  NEUTROABS 16.6* 17.8* 15.3* 18.4* 16.7*  --   HGB 11.1* 12.7* 12.3* 11.7* 11.9* 12.5*  HCT 35.5* 40.0 38.6* 36.2* 37.9* 40.6  MCV 84.5 83.7 82.7 81.7 84.8 86.0  PLT 448* 499* 311 370 340 370     LFT Recent Labs  Lab 07/30/23 0422 07/31/23 0233 08/01/23 0420 08/02/23 0252 08/04/23 0700  AST 19 19 14* 16 21  ALT 13 12 11 12 16   ALKPHOS 82 80 74 76 79  BILITOT 0.6 0.6 0.6 0.5 0.4  PROT 6.0* 6.4* 5.8* 6.0* 7.0  ALBUMIN 2.0* 2.4* 2.0* 2.1* 2.6*     Antibiotics: Anti-infectives (From admission, onward)    Start     Dose/Rate Route Frequency Ordered Stop   07/29/23 0200  ceFEPIme (MAXIPIME) 2 g in sodium chloride 0.9 % 100 mL IVPB        2 g 200 mL/hr over 30 Minutes Intravenous Every 8 hours 07/28/23 2125 08/07/23 2359   07/28/23 1830  vancomycin (VANCOCIN) IVPB 1000 mg/200 mL premix  Placed in "Followed by" Linked Group   1,000 mg 200 mL/hr over 60 Minutes Intravenous  Once 07/28/23 1719 07/28/23 2121   07/28/23 1730  ceFEPIme (MAXIPIME) 2 g in sodium chloride 0.9 % 100 mL IVPB        2 g 200 mL/hr over 30 Minutes Intravenous  Once 07/28/23 1719 07/28/23 1850   07/28/23 1730  vancomycin (VANCOCIN) IVPB 1000 mg/200 mL premix       Placed in "Followed by" Linked Group   1,000 mg 200 mL/hr over 60 Minutes Intravenous  Once 07/28/23 1719 07/28/23 2013        DVT prophylaxis: Apixaban  Code Status: Full code  Family Communication: No family at bedside   CONSULTS    Subjective    Complains of ongoing back pain  Objective    Physical Examination:  General-appears in no acute distress Heart-S1-S2, regular, no murmur auscultated Lungs-clear to auscultation bilaterally, no wheezing or crackles auscultated Abdomen-soft, nontender, no organomegaly Extremities-right BKA Neuro-alert, oriented x3, no focal deficit noted   Status is: Inpatient:             Meredeth Ide   [facility] If 7PM-7AM, please contact night-coverage at [email or web address], Office  [phone]   08/04/2023, 10:51 AM  LOS: 7 days

## 2023-08-04 NOTE — Progress Notes (Signed)
Noted on chest triad Hospitalist  PROGRESS NOTE  Cody Pena XBJ:[id number] DOB: [birth year] DOA: 07/28/2023 [facility]   Brief HPI:   62 y.o. male with medical history significant of peripheral vascular disease s/p BKA, hypertension, insulin-dependent type 2 diabetes, and hyperlipidemia who presents with altered mental status and back pain.   Pt not able to provide much hx as he is lethargic after receiving IV morphine. He has been told of cancer in his imaging and appears depressed r workup in the ER is was suggestive of bilateral pyelonephritis with sepsis, new lung mass suspicious for malignancy with mets to spine, lymph nodes and brain, brain mass, small incidental CVA, PE, A-fib RVR    Assessment/Plan:   Sepsis (HCC)   -secondary to bilateral pyelonephritis  - No signs of obstruction or hydronephrosis, continue empiric antibiotics, cefepime till 12/25 - Blood cultures negative at 5 days.  -Urine culture never sent from ED. WBC up, but in setting of steroids, remains afebrile without urinary symptoms.  -Sepsis physiology has resolved    Paroxysmal atrial fibrillation with RVR (HCC)  -[place] vasc 2 score of greater than 3.   - Newly diagnosed atrial fibrillation.   -Continue metoprolol 75 mg p.o. twice daily; digoxin started by cardiology as patient is now in sinus rhythm -Continue Eliquis     Chronic systolic heart failure EF 35% with global hypokinesis on echocardiogram.   -Echocardiogram done, EF 45% -Cardiology following, plan to treat with Lasix as needed     Left lung mass suspicious for primary lung malignancy with possible mets to spine, lymph nodes, brain. Long-term prognosis does not appear to be good, pulmonary and oncology following,  - s/p bronchoscopy morning of 07/30/2023 with biopsy, follow biopsy results. These are still pending on 12/20.  Defer further management of malignancy to medical and radiation oncology, case discussed with Dr.  Thornton Papas in detail on 08/01/2023.  New treatment provided including radiation treatment will be palliative in nature, based on biopsy results life expectancy could be between few months to few years.  Palliative care also on board.   Brain mass  -MRI confirms brain mass with some vasogenic edema. - Continue decadron, change to oral - Repeat MRI brain per RadOnc today   CVA (cerebral vascular accident) (HCC) - MRI brain imaging resulted demonstrating mixed cystic and solid enhancing mass to the left occiput lobe.  There was also a punctate acute ischemic nonhemorrhagic infarct at the right parietal cortex.  Case discussed with Dr. Pearlean Brownie stroke team on 07/29/2023 in detail, cleared for heparin followed by Eliquis which has been started.  Stroke for practical purposes appears to be incidental in nature.   Acute pulmonary embolism (HCC)  -Seen on CTA chest -Heparin switched to Eliquis     Back pain   -  MRI of the L-spine shows suspicion for possible metastatic malignancy in the spine.  Supportive care for now. -Start Robaxin 500 mg p.o. every 6 hours for 6 doses   Hypercalcemia: Resolved after hydration, calcitonin, zometa. PTH appropriately suppressed.  PTHrP still pending.     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 - Vascular surgery follow-up.   S/P BKA (below knee amputation), right (HCC)  History of PAD, There is question of possible occluded right superior femoral artery stent on CT abdomen/pelvis but patient denies any claudication or pain, monitor clinically.   - Outpatient vascular surgery  follow-up.   Hospital acquired metabolic encephalopathy.  Due to being in unfamiliar setting, brain mass, steroid in use, placed on twice daily Seroquel and as needed Haldol, mentation improving, oriented x 3 on 08/01/2023. Improved.   T2DM (type 2 diabetes mellitus) (HCC)  -  insulin-dependent T2DM , keep on resistant SSI while on steroids for cerebral edema, continue Semglee -CBG has been elevated yesterday, blood glucose is better today -Will follow    Hypophosphatemia -Replace phosphorus and check phosphorus level in a.m.    Medications     apixaban  5 mg Oral BID   aspirin EC  81 mg Oral Daily   atorvastatin  10 mg Oral QPM   dexamethasone  4 mg Oral Daily   insulin aspart  0-20 Units Subcutaneous TID PC & HS   insulin glargine-yfgn  10 Units Subcutaneous BID   metoprolol tartrate  75 mg Oral BID   nicotine  14 mg Transdermal Daily   QUEtiapine  25 mg Oral BID     Data Reviewed:   CBG:  Recent Labs  Lab 08/03/23 0808 08/03/23 1235 08/03/23 1619 08/03/23 2225 08/04/23 0745  GLUCAP 177* 170* 197* 206* 156*    SpO2: 98 % O2 Flow Rate (L/min): 2 L/min    Vitals:   08/03/23 1353 08/03/23 2037 08/04/23 0525 08/04/23 0940  BP: 130/76 (!) 145/80 (!) 140/79 (!) 150/94  Pulse: 77 87 83 97  Resp: 20 18 17    Temp:  (!) 97.5 F (36.4 C) (!) 97.5 F (36.4 C)   TempSrc:  Oral Oral   SpO2: 97% 95% 98%   Weight:      Height:          Data Reviewed:  Basic Metabolic Panel: Recent Labs  Lab 07/30/23 0422 07/30/23 2326 07/31/23 0233 08/01/23 0420 08/02/23 0252 08/04/23 0700  NA 131* 136 136 135 132* 131*  K 4.3 4.8 4.3 4.7 4.5 4.3  CL 101 101 100 103 101 99  CO2 22 21* 23 22 21* 20*  GLUCOSE 236* 153* 154* 157* 253* 161*  BUN 32* 32* 30* 31* 24* 19  CREATININE 0.90 0.91 0.90 0.69 0.72 0.61  CALCIUM 11.9* 12.1* 11.7* 10.0 9.2 8.6*  MG 2.0 1.9 2.0 2.1 2.1  --   PHOS 3.7  --  2.3* 2.2* 1.4* 1.6*    CBC: Recent Labs  Lab 07/30/23 0422 07/31/23 0233 08/01/23 0420 08/02/23 0252 08/03/23 1057 08/04/23 0700  WBC 18.2* 19.6* 16.6* 19.9* 19.1* 18.8*  NEUTROABS 16.6* 17.8* 15.3* 18.4* 16.7*  --   HGB 11.1* 12.7* 12.3* 11.7* 11.9* 12.5*  HCT 35.5* 40.0 38.6* 36.2* 37.9* 40.6  MCV 84.5 83.7 82.7 81.7 84.8 86.0  PLT 448*  499* 311 370 340 370    LFT Recent Labs  Lab 07/30/23 0422 07/31/23 0233 08/01/23 0420 08/02/23 0252 08/04/23 0700  AST 19 19 14* 16 21  ALT 13 12 11 12 16   ALKPHOS 82 80 74 76 79  BILITOT 0.6 0.6 0.6 0.5 0.4  PROT 6.0* 6.4* 5.8* 6.0* 7.0  ALBUMIN 2.0* 2.4* 2.0* 2.1* 2.6*     Antibiotics: Anti-infectives (From admission, onward)    Start     Dose/Rate Route Frequency Ordered Stop   07/29/23 0200  ceFEPIme (MAXIPIME) 2 g in sodium chloride 0.9 % 100 mL IVPB        2 g 200 mL/hr over 30 Minutes Intravenous Every 8 hours 07/28/23 2125 08/07/23 2359   07/28/23 1830  vancomycin (VANCOCIN) IVPB 1000 mg/200  mL premix       Placed in "Followed by" Linked Group   1,000 mg 200 mL/hr over 60 Minutes Intravenous  Once 07/28/23 1719 07/28/23 2121   07/28/23 1730  ceFEPIme (MAXIPIME) 2 g in sodium chloride 0.9 % 100 mL IVPB        2 g 200 mL/hr over 30 Minutes Intravenous  Once 07/28/23 1719 07/28/23 1850   07/28/23 1730  vancomycin (VANCOCIN) IVPB 1000 mg/200 mL premix       Placed in "Followed by" Linked Group   1,000 mg 200 mL/hr over 60 Minutes Intravenous  Once 07/28/23 1719 07/28/23 2013        DVT prophylaxis: Apixaban  Code Status: Full code  Family Communication: No family at bedside   CONSULTS    Subjective    Complains of ongoing back pain  Objective    Physical Examination:  General-appears in no acute distress Heart-S1-S2, regular, no murmur auscultated Lungs-clear to auscultation bilaterally, no wheezing or crackles auscultated Abdomen-soft, nontender, no organomegaly Extremities-right BKA Neuro-alert, oriented x3, no focal deficit noted   Status is: Inpatient:             Meredeth Ide   [facility] If 7PM-7AM, please contact night-coverage at [email or web address], Office  [phone]   08/04/2023, 10:51 AM  LOS: 7 days

## 2023-08-05 ENCOUNTER — Encounter: Payer: Self-pay | Admitting: *Deleted

## 2023-08-05 ENCOUNTER — Ambulatory Visit
Admit: 2023-08-05 | Discharge: 2023-08-05 | Disposition: A | Discharge status: Home / Self Care | Payer: Medicaid Other | Attending: Radiation Oncology | Admitting: Radiation Oncology

## 2023-08-05 ENCOUNTER — Ambulatory Visit
Admit: 2023-08-05 | Discharge: 2023-08-05 | Disposition: A | Discharge status: Home / Self Care | Payer: Medicaid Other | Attending: Radiation Oncology

## 2023-08-05 ENCOUNTER — Other Ambulatory Visit: Payer: Self-pay

## 2023-08-05 DIAGNOSIS — C7931 Secondary malignant neoplasm of brain: Secondary | ICD-10-CM

## 2023-08-05 DIAGNOSIS — N12 Tubulo-interstitial nephritis, not specified as acute or chronic: Secondary | ICD-10-CM | POA: Diagnosis not present

## 2023-08-05 DIAGNOSIS — R918 Other nonspecific abnormal finding of lung field: Secondary | ICD-10-CM | POA: Diagnosis not present

## 2023-08-05 DIAGNOSIS — Z515 Encounter for palliative care: Secondary | ICD-10-CM

## 2023-08-05 DIAGNOSIS — I4891 Unspecified atrial fibrillation: Secondary | ICD-10-CM | POA: Diagnosis not present

## 2023-08-05 DIAGNOSIS — R110 Nausea: Secondary | ICD-10-CM

## 2023-08-05 DIAGNOSIS — C7951 Secondary malignant neoplasm of bone: Secondary | ICD-10-CM

## 2023-08-05 DIAGNOSIS — R4182 Altered mental status, unspecified: Secondary | ICD-10-CM

## 2023-08-05 DIAGNOSIS — D491 Neoplasm of unspecified behavior of respiratory system: Secondary | ICD-10-CM

## 2023-08-05 LAB — GLUCOSE, CAPILLARY
Glucose-Capillary: 150 mg/dL — ABNORMAL HIGH (ref 70–99)
Glucose-Capillary: 232 mg/dL — ABNORMAL HIGH (ref 70–99)
Glucose-Capillary: 188 mg/dL — ABNORMAL HIGH (ref 70–99)

## 2023-08-05 LAB — RAD ONC ARIA SESSION SUMMARY
Course Elapsed Days: 0
Plan Fractions Treated to Date: 1
Plan Fractions Treated to Date: 1
Plan Prescribed Dose Per Fraction: 3.5 Gy
Plan Prescribed Dose Per Fraction: 4 Gy
Plan Total Fractions Prescribed: 8
Plan Total Fractions Prescribed: 8
Plan Total Prescribed Dose: 28 Gy
Plan Total Prescribed Dose: 32 Gy
Reference Point Dosage Given to Date: 3.5 Gy
Reference Point Dosage Given to Date: 4 Gy
Reference Point Session Dosage Given: 3.5 Gy
Reference Point Session Dosage Given: 4 Gy
Session Number: 1

## 2023-08-05 LAB — PHOSPHORUS: Phosphorus: 1.6 mg/dL — ABNORMAL LOW (ref 2.5–4.6)

## 2023-08-05 MED ORDER — INSULIN GLARGINE 100 UNIT/ML SOLOSTAR PEN: 10.0000 [IU] | PEN_INJECTOR | Freq: Two times a day (BID) | SUBCUTANEOUS | 0 refills | Status: DC

## 2023-08-05 MED ORDER — METOPROLOL TARTRATE 75 MG PO TABS: 75.0000 mg | ORAL_TABLET | Freq: Two times a day (BID) | ORAL | 0 refills | Status: DC

## 2023-08-05 MED ORDER — APIXABAN 5 MG PO TABS: 5.0000 mg | ORAL_TABLET | Freq: Two times a day (BID) | ORAL | 0 refills | Status: AC

## 2023-08-05 MED ORDER — HYDROCODONE-ACETAMINOPHEN 5-325 MG PO TABS: 1.0000 | ORAL_TABLET | Freq: Four times a day (QID) | ORAL | 0 refills | Status: DC | PRN

## 2023-08-05 MED ORDER — DEXAMETHASONE 4 MG PO TABS: 4.0000 mg | ORAL_TABLET | Freq: Every day | ORAL | 0 refills | Status: DC

## 2023-08-05 MED ORDER — NYSTATIN 100000 UNIT/ML MT SUSP: 5.0000 mL | Freq: Four times a day (QID) | OROMUCOSAL | 0 refills | Status: DC

## 2023-08-05 MED ORDER — APIXABAN 5 MG PO TABS: 5.0000 mg | ORAL_TABLET | Freq: Two times a day (BID) | ORAL | 0 refills | Status: DC

## 2023-08-05 MED ORDER — SODIUM PHOSPHATES 45 MMOLE/15ML IV SOLN
45.0000 mmol | Freq: Once | INTRAVENOUS | Status: AC
  Administered 2023-08-05: 45 mmol via INTRAVENOUS
  Filled 2023-08-05: qty 15

## 2023-08-05 MED ORDER — ONDANSETRON HCL 4 MG/2ML IJ SOLN
INTRAMUSCULAR | Status: AC
  Filled 2023-08-05: qty 2

## 2023-08-05 MED ORDER — ONDANSETRON HCL 8 MG PO TABS
4.0000 mg | ORAL_TABLET | Freq: Once | ORAL | Status: DC
  Filled 2023-08-05: qty 0.5

## 2023-08-05 MED ORDER — NYSTATIN 100000 UNIT/ML MT SUSP
5.0000 mL | Freq: Four times a day (QID) | OROMUCOSAL | Status: DC
  Administered 2023-08-05 (×3): 500000 [IU] via ORAL
  Filled 2023-08-05 (×3): qty 5

## 2023-08-05 MED ORDER — ONDANSETRON 4 MG PO TBDP
ORAL_TABLET | ORAL | Status: AC
  Filled 2023-08-05: qty 1

## 2023-08-05 MED ORDER — ONDANSETRON HCL 4 MG/2ML IJ SOLN
4.0000 mg | Freq: Once | INTRAMUSCULAR | Status: AC
  Administered 2023-08-05: 4 mg via INTRAVENOUS

## 2023-08-05 MED ORDER — ONDANSETRON 4 MG PO TBDP: 4.0000 mg | ORAL_TABLET | Freq: Once | ORAL | Status: DC

## 2023-08-05 NOTE — Progress Notes (Addendum)
IP PROGRESS NOTE  Subjective:   Cody Pena was transferred to [facility] over the weekend.  He reports pain at the left axilla this morning.  He reports the pain is relieved with hydrocodone.  He wants to go home.  Objective: Vital signs in last 24 hours: Blood pressure (!) 168/78, pulse 95, temperature 97.9 F (36.6 C), temperature source Oral, resp. rate 16, height 6\' 4"  (1.93 m), weight 177 lb 7.5 oz (80.5 kg), SpO2 96%.  Intake/Output from previous day: 12/22 0701 - 12/23 0700 In: 1054.2 [P.O.:480; IV Piggyback:574.2] Out: 1825 [Urine:1825]  Physical Exam:  HEENT: Thrush at the bilateral buccal mucosa Lungs: Decreased breath sounds at the left compared to the right chest, no respiratory distress Cardiac: Regular rate and rhythm Abdomen: No hepatosplenomegaly Extremities: No leg edema Neurologic: Alert, follows commands, oriented to place  Lab Results: Recent Labs    08/03/23 1057 08/04/23 0700  WBC 19.1* 18.8*  HGB 11.9* 12.5*  HCT 37.9* 40.6  PLT 340 370    BMET Recent Labs    08/04/23 0700  NA 131*  K 4.3  CL 99  CO2 20*  GLUCOSE 161*  BUN 19  CREATININE 0.61  CALCIUM 8.6*    No results found for: "CEA1", "CEA", "JXB147", "CA125"  Studies/Results: No results found.   Medications: I have reviewed the patient's current medications.  Assessment/Plan:  Lung cancer 07/28/2023: CTs-large heterogenous mass in the left upper lobe/suprahilar region, diffuse bronchial wall thickening in the lingula and left lower lobe, multiple pulmonary nodules in the right lung, mediastinal, bilateral hilar, left axillary lymphadenopathy, small left pleural effusion 07/28/2023: CT head-3.4 cm left parietal mass with surrounding edema 07/28/2023: MRI brain-mixed cystic/solid peripheral enhancing mass in the left occipital lobe with mild surrounding vasogenic edema, punctate acute ischemic nonhemorrhagic infarct at the right parietal cortex 07/28/2023: MRI lumbar  spine-abnormal marrow replacement at L1 concerning for osseous metastatic disease with associated mild pathologic compression fracture.  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  Pulmonary embolism CT chest 07/29/2023: Small subsegmental embolic burden in the right lower lobe 16 2024-lower extremity Dopplers negative for DVT  3.  Hypercalcemia of malignancy, status post Zometa 07/30/2023 4.  Pain secondary to the left lung mass with probable chest wall invasion and left axillary adenopathy 5.  Atrial fibrillation 6.  Pyelonephritis noted on CT 07/28/2023 and lumbar MRI 07/28/2023 7.  Diabetes 8.  Status post right BKA 7.  Altered mental status secondary to hospital delirium and hypercalcemia  Cody Pena presented with a lung mass, metastatic lung/lymph nodes/bone lesions, and a brain metastasis.  He had symptomatic hypercalcemia on hospital admission.  The hypercalcemia has resolved following intravenous hydration and Zometa.  The left lung biopsy confirms squamous cell carcinoma.  I discussed the diagnosis, prognosis, and treatment options with Cody Pena.  I attempted to contact his daughter by telephone this morning and there was no answer.  I will call her again later today.  He is symptomatic with pain at the left chest wall secondary to the locally advanced left upper lung tumor and axillary adenopathy.  He reports adequate pain relief with hydrocodone.  We can escalate the narcotic analgesic regimen if his pain is not relieved with hydrocodone.  I explained to the predicted lifespan of weeks to  a few months in the absence of treatment.  He indicated he would like to proceed with treatment.  I recommend palliative radiation to the  brain mass and then initiation of systemic therapy.  He may be a candidate for single agent immunotherapy if the PD-1 score returns elevated.  Cody Pena can be discharged to home with his family if they are able to care for him in the home.  If not, he will need skilled nursing facility placement.  Recommendations: 1.  Obtain PD-1 score on the lung biopsy tissue-will order 2.   Proceed with palliative brain radiation per Dr. Kathrynn Running 3.  Continue physical therapy/Occupational Therapy 4.  Continue hydrocodone as needed for pain, can escalate to oxycodone and add a long-acting narcotic as needed 5.  Home versus SNF after evaluation by PT/OT and discussion with family 6.   Outpatient follow-up will be scheduled at the [facility] 7.  Nystatin for oral candidiasis  LOS: 8 days   Thornton Papas, MD   08/05/2023, 7:58 AM  Addendum: I discussed the case with his daughter, Cody Pena, by telephone at approximately 10:40 AM.  We discussed the diagnosis, prognosis, and treatment options.  She indicated the family would like to take him home.  Her sister will be in contact with the floor team regarding discharge plans.  Outpatient follow-up will be scheduled at the cancer center for next week.

## 2023-08-05 NOTE — Progress Notes (Signed)
Pt's daughter will not be able to pick up medications tonight. Pharmacy closes at 1900, She will be here after 1900 to pick up pt. Request sent to provider to send prescriptions to [facility] on [address].

## 2023-08-05 NOTE — TOC Transition Note (Signed)
Transition of Care [facility]) - Discharge Note   Patient Details  Name: Cody Pena MRN: [id number] Date of Birth: [birth year]  [facility] CM/SW Contact:  Beckie Busing, RN Phone Number:[phone]  08/05/2023, 5:40 PM   Clinical Narrative:    Patient with discharge orders. CM at bedside . Patient states that he is from home with spouse daughter grandchildren. Patient states that he refuses SNF and that he has plenty of assistance at home for his care. Patient reports that he has all the DME he needs( wheelchair, [facility], shower chair) patient confirms that he is active with home health but can not recall the name. CM called spouse [name] who confirms that patient has home health. CM attempted to contact daughter [name] but there is no answer. Patient states that daughter will pickup and transport home. No other TOC needs noted at this time. TOC will sign off.    Final next level of care: Home w Home Health Services Barriers to Discharge: No Barriers Identified   Patient Goals and CMS Choice Patient states their goals for this hospitalization and ongoing recovery are:: Ready to go home to be with his family CMS Medicare.gov Compare Post Acute Care list provided to::  (patient declined) Choice offered to / list presented to : Patient      Discharge Placement                       Discharge Plan and Services Additional resources added to the After Visit Summary for                  DME Arranged: N/A (patient states that he has wheelchair [employer] shower chair and everything he needs at home) DME Agency: NA       HH Arranged: Refused HH (stat4es that he has been recieving home health. 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)          Social Drivers of Health (SDOH) Interventions SDOH Screenings   Food Insecurity: No Food Insecurity (07/29/2023)  Housing: Low Risk  (07/29/2023)  Transportation Needs: No  Transportation Needs (07/29/2023)  Utilities: Not At Risk (07/29/2023)  Financial Resource Strain: Not on File (01/10/2023)   Received from [place], [place]  Physical Activity: Not on File (01/10/2023)   Received from [place], [place]  Social Connections: Not on File (01/10/2023)   Received from [place], [place]  Stress: Not on File (01/10/2023)   Received from [place], [place]  Tobacco Use: High Risk (07/31/2023)     Readmission Risk Interventions     No data to display

## 2023-08-05 NOTE — Discharge Summary (Signed)
Physician Discharge Summary   Patient: Cody Pena MRN: [id number] DOB: Apr 24, 1961  Admit date:     07/28/2023  Discharge date: 08/05/23  Discharge Physician: Rickey Barbara   PCP: [facility]   Recommendations at discharge:    Follow up with PCP in 1-2 weeks Follow up with Oncology as scheduled Follow up with Rad Onc as scheduled  Discharge Diagnoses: Principal Problem:   Sepsis (HCC) Active Problems:   Paroxysmal atrial fibrillation with RVR (HCC)   HTN (hypertension)   Brain mass   Hyperlipidemia   Lung mass   T2DM (type 2 diabetes mellitus) (HCC)   S/P BKA (below knee amputation), right (HCC)   Acute pyelonephritis   Aneurysm (HCC)   Hypercalcemia   Back pain   Acute pulmonary embolism (HCC)   Silent cerebral infarction (HCC)   Dyspnea on exertion   Pleural effusion   Weight loss, abnormal   PAF (paroxysmal atrial fibrillation) (HCC)   Palliative care encounter  Resolved Problems:   * No resolved hospital problems. *  Hospital Course: 62 y.o. male with medical history significant of peripheral vascular disease s/p BKA, hypertension, insulin-dependent type 2 diabetes, and hyperlipidemia who presents with altered mental status and back pain.   Pt not able to provide much hx as he is lethargic after receiving IV morphine. He has been told of cancer in his imaging and appears depressed r workup in the ER is was suggestive of bilateral pyelonephritis with sepsis, new lung mass suspicious for malignancy with mets to spine, lymph nodes and brain, brain mass, small incidental CVA, PE, A-fib RVR  Assessment and Plan: Sepsis (HCC)   -secondary to bilateral pyelonephritis  - No signs of obstruction or hydronephrosis, pt was given empiric antibiotics, cefepime - Blood cultures negative at 5 days.  -Urine culture never sent from ED. WBC up, but in setting of steroids, remained afebrile without urinary symptoms.  -Sepsis physiology has resolved      Paroxysmal atrial fibrillation with RVR (HCC)  -[place] vasc 2 score of greater than 3.   - Newly diagnosed atrial fibrillation.   -Continue metoprolol 75 mg p.o. twice daily; digoxin started by cardiology as patient is now in sinus rhythm -Continue Eliquis    Chronic systolic heart failure EF 35% with global hypokinesis on echocardiogram.   -Echocardiogram done, EF 45% -Cardiology following, plan to treat with Lasix as needed    Left lung mass suspicious for primary lung malignancy with possible mets to spine, lymph nodes, brain. 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 - Continue decadron, change to oral - Rad onc to follow as outpatient   CVA (cerebral vascular accident) (HCC) - MRI brain imaging resulted demonstrating mixed cystic and solid enhancing mass to the left occiput lobe.  There was also a punctate acute ischemic nonhemorrhagic infarct at the right parietal cortex.  Case discussed with Dr. Pearlean Brownie stroke team on 07/29/2023 in detail, cleared for heparin followed by Eliquis which has been started.   -ASA was discontinued as pt was started on eliquis   Acute pulmonary embolism (HCC)  -Seen on CTA chest -Heparin switched to Eliquis    Back pain   - MRI of the L-spine shows suspicion for possible metastatic  malignancy in the spine.  Supportive care for now. -Continue with analgesia as tolerated   Hypercalcemia:  -Resolved after hydration, calcitonin, zometa. PTH appropriately suppressed.    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 - Vascular surgery follow-up.   S/P  BKA (below knee amputation), right (HCC)  History of PAD, There is question of possible occluded right superior femoral artery stent on CT abdomen/pelvis but patient denies any claudication or pain, monitor clinically.   - Outpatient vascular surgery follow-up.   Hospital acquired metabolic encephalopathy.  Due to being in unfamiliar setting, brain mass, steroid in use, placed on twice daily Seroquel and as needed Haldol, mentation improving, oriented x 3 on 08/01/2023. Improved.   T2DM (type 2 diabetes mellitus) (HCC)  - insulin-dependent T2DM , keep on resistant SSI while on steroids for cerebral edema, continue Semglee while in hospital -Prescribed basal regimen while pt is on decadron   Hypophosphatemia -Replace phosphorus and check phosphorus level in a.m.    Consultants: Oncology Procedures performed:   Disposition: Home Diet recommendation:  Carb modified diet DISCHARGE MEDICATION: Allergies as of 08/05/2023       Reactions   Ceftriaxone Itching, Nausea And Vomiting, Rash   Noted minutes after starting medication No rash noted with Ancef on 01-31-2022 during general anesthesia Has received cefepime and cefazolin without reaction        Medication List     STOP taking these medications    gabapentin 600 MG tablet Commonly known as: NEURONTIN   NovoLOG Mix 70/30 FlexPen (70-30) 100 UNIT/ML FlexPen Generic drug: insulin aspart protamine - aspart   oxycodone 5 MG capsule Commonly known as: OXY-IR   Oxycodone HCl 10 MG Tabs   pioglitazone 15 MG tablet Commonly known as: ACTOS       TAKE these medications    acetaminophen 500 MG tablet Commonly known as: TYLENOL Take 2 tablets (1,000 mg total) by mouth every 6 (six) hours as needed for mild pain or fever (For the first 3 days, may take 2 tabs (1000 mg total) 4 times daily scheduled following which can take 2 tabs (1000 mg total) 4 times daily as needed.).   apixaban 5 MG Tabs tablet Commonly known as:  ELIQUIS Take 1 tablet (5 mg total) by mouth 2 (two) times daily.   atorvastatin 10 MG tablet Commonly known as: LIPITOR Take 10 mg by mouth daily.   dexamethasone 4 MG tablet Commonly known as: DECADRON Take 1 tablet (4 mg total) by mouth daily. Start taking on: August 06, 2023   HYDROcodone-acetaminophen 5-325 MG tablet Commonly known as: NORCO/VICODIN Take 1 tablet by mouth every 6 (six) hours as needed for severe pain (pain score 7-10).   insulin glargine 100 UNIT/ML Solostar Pen Commonly known as: LANTUS Inject 10 Units into the skin 2 (two) times daily.   methocarbamol 500 MG tablet Commonly known as: ROBAXIN Take 1 tablet (500 mg total) by mouth every 6 (six) hours as needed for muscle spasms.   Metoprolol Tartrate 75 MG Tabs Take 1 tablet (75 mg total) by mouth 2 (two) times daily.   multivitamin with minerals Tabs tablet Take 1 tablet by mouth daily.   nicotine 14 mg/24hr patch Commonly known as: NICODERM CQ - dosed in mg/24 hours Place 1 patch (14 mg total) onto the skin daily.   nystatin 100000 UNIT/ML suspension Commonly known as: MYCOSTATIN Take 5 mLs (500,000 Units total)  by mouth 4 (four) times daily.   TechLite Plus Pen Needles 32G X 4 MM Misc Generic drug: Insulin Pen Needle Use as directed two times daily.        Follow-up Information     [facility] Follow up.   Specialty: Rehabilitation Why: PLesase call to schedule the outpatient therapy Contact information: [address] [place] [address] [phone]        [facility] Follow up in 2 week(s).   Specialty: Family Medicine Why: Hospital follow up Contact information: [address] [place] [address] [phone]         [facility] Follow up.   Specialty: Radiation Oncology Why: as scheduled your next brain radiation treatment is 10am tomorrow morning  (12/24) Contact information: [address] [place] [address] [phone]        Ladene Artist, MD Follow up.   Specialty: Oncology Why: Hospital follow up, as scheduled Contact information: [address] [place] [address] [phone]                Discharge Exam: Filed Weights   07/29/23 0200 07/30/23 0925 08/03/23 0031  Weight: 77.7 kg 77.7 kg 80.5 kg   General exam: Awake, laying in bed, in nad Respiratory system: Normal respiratory effort, no wheezing Cardiovascular system: regular rate, s1, s2 Gastrointestinal system: Soft, nondistended, positive BS Central nervous system: CN2-12 grossly intact, strength intact Extremities: Perfused, no clubbing Skin: Normal skin turgor, no notable skin lesions seen Psychiatry: Mood normal // no visual hallucinations   Condition at discharge: fair  The results of significant diagnostics from this hospitalization (including imaging, microbiology, ancillary and laboratory) are listed below for reference.   Imaging Studies: MR BRAIN W WO CONTRAST Result Date: 08/02/2023 CLINICAL DATA:  Brain/CNS neoplasm, staging SRS protocol for consideration of SRS treatment EXAM: MRI HEAD WITHOUT AND WITH CONTRAST TECHNIQUE: Multiplanar, multiecho pulse sequences of the brain and surrounding structures were obtained without and with intravenous contrast. CONTRAST:  10mL GADAVIST GADOBUTROL 1 MMOL/ML IV SOLN COMPARISON:  MRI head 07/28/2023. FINDINGS: Brain: In comparison to recent MRI, similar cystic and solid mass in the left occipital lobe that measures approximately 4.2 x 3.4 cm on series 5, image 29. Mass effect and surrounding T2/FLAIR hyperintensities similar. Unfortunately postcontrast imaging is significantly motion limited but there are no other lesions identified. Punctate focus of apparent restricted diffusion in the perirolandic right parietal lobe (see series 410, image 43). No enhancement on the  motion limited postcontrast imaging. No evidence of acute hemorrhage. Vascular: Major arterial flow voids are maintained at the skull base. Skull and upper cervical spine: Normal marrow signal. Sinuses/Orbits: Clear sinuses.  No acute orbital findings. IMPRESSION: 1. No substantial change in cystic and solid mass in the left occipital lobe. Unfortunately postcontrast imaging is significantly motion limited but there are no other enhancing lesions identified 2. Redemonstrated punctate evolving right perirolandic parietal infarct. Electronically Signed   By: Feliberto Harts M.D.   On: 08/02/2023 17:21   ECHOCARDIOGRAM LIMITED Result Date: 08/01/2023    ECHOCARDIOGRAM LIMITED REPORT   Patient Name:   MORRIS BERGSCHNEIDER Date of Exam: 08/01/2023 Medical Rec #:  [id number]       Height:       75.0 in Accession #:    [id number]      Weight:       171.3 lb Date of Birth:  10-23-1960  BSA:          2.055 m Patient Age:    62 years        BP:           122/78 mmHg Patient Gender: M               HR:           97 bpm. Exam Location:  Inpatient Procedure: Limited Echo, Cardiac Doppler, Color Doppler and Intracardiac            Opacification Agent Indications:    CHF I50.21  History:        Patient has prior history of Echocardiogram examinations, most                 recent 07/29/2023.  Sonographer:    Harriette Bouillon RDCS Referring Phys: [id number] PAULA V ROSS IMPRESSIONS  1. LV function assessment is challenging with atrial arrhythmia. Left ventricular ejection fraction, by estimation, is 40 to 45%. The left ventricle has mildly decreased function. Conclusion(s)/Recommendation(s): Consider repeating study in sinus rhythm. FINDINGS  Left Ventricle: LV function assessment is challenging with atrial arrhythmia. Left ventricular ejection fraction, by estimation, is 40 to 45%. The left ventricle has mildly decreased function. Definity contrast agent was given IV to delineate the left ventricular endocardial borders. LEFT VENTRICLE  PLAX 2D LVIDd:         5.90 cm LV PW:         0.90 cm LV IVS:        0.90 cm  Carolan Clines Electronically signed by Carolan Clines Signature Date/Time: 08/01/2023/3:32:30 PM    Final    DG Chest Port 1 View Result Date: 08/01/2023 CLINICAL DATA:  Shortness of breath. EXAM: PORTABLE CHEST 1 VIEW COMPARISON:  July 29, 2023. FINDINGS: Stable cardiomediastinal silhouette. There is again noted large left upper lobe mass with associated postobstructive atelectasis. Mild left basilar atelectasis and pleural effusion is noted. Multiple right-sided pulmonary nodules are noted consistent with metastatic disease IMPRESSION: Large left upper lobe mass is again noted with associated postobstructive atelectasis consistent with malignancy. Multiple metastatic lesions are noted in right lung. Electronically Signed   By: Lupita Raider M.D.   On: 08/01/2023 12:32   CT ANGIO HEAD NECK W WO CM Result Date: 07/30/2023 CLINICAL DATA:  Stroke follow-up EXAM: CT ANGIOGRAPHY HEAD AND NECK WITH AND WITHOUT CONTRAST TECHNIQUE: Multidetector CT imaging of the head and neck was performed using the standard protocol during bolus administration of intravenous contrast. Multiplanar CT image reconstructions and MIPs were obtained to evaluate the vascular anatomy. Carotid stenosis measurements (when applicable) are obtained utilizing NASCET criteria, using the distal internal carotid diameter as the denominator. RADIATION DOSE REDUCTION: This exam was performed according to the departmental dose-optimization program which includes automated exposure control, adjustment of the mA and/or kV according to patient size and/or use of iterative reconstruction technique. CONTRAST:  75mL OMNIPAQUE IOHEXOL 350 MG/ML SOLN COMPARISON:  Head CT 07/28/2023 FINDINGS: CT HEAD FINDINGS Brain: Unchanged appearance of hemorrhagic lesion of the left posterior parietal lobe. No midline shift or other mass effect. Vascular: No hyperdense vessel or unexpected  vascular calcification. Skull: The visualized skull base, calvarium and extracranial soft tissues are normal. Sinuses/Orbits: No fluid levels or advanced mucosal thickening of the visualized paranasal sinuses. No mastoid or middle ear effusion. Normal orbits. CTA NECK FINDINGS Skeleton: C3-7 ACDF. Other neck: Normal pharynx, larynx and major salivary glands. No cervical lymphadenopathy. Unremarkable thyroid gland. Upper chest: Complete  opacification of the left apex. Multiple pulmonary nodules in the visualized right lung apex, measuring up to 9 mm. Fluid adenopathy in the upper mediastinum. Aortic arch: There is calcific atherosclerosis of the aortic arch. Conventional 3 vessel aortic branching pattern. RIGHT carotid system: No dissection, occlusion or aneurysm. Mild atherosclerotic calcification at the carotid bifurcation without hemodynamically significant stenosis. LEFT carotid system: No dissection, occlusion or aneurysm. There is calcified atherosclerosis extending into the proximal ICA, resulting in less than 50% stenosis. Vertebral arteries: Codominant configuration. There is no dissection, occlusion or flow-limiting stenosis to the skull base (V1-V3 segments). CTA HEAD FINDINGS POSTERIOR CIRCULATION: Vertebral arteries are normal. No proximal occlusion of the anterior or inferior cerebellar arteries. Basilar artery is normal. Superior cerebellar arteries are normal. Posterior cerebral arteries are normal. ANTERIOR CIRCULATION: Intracranial internal carotid arteries are normal. Anterior cerebral arteries are normal. Middle cerebral arteries are normal. Venous sinuses: As permitted by contrast timing, patent. Anatomic variants: None Review of the MIP images confirms the above findings. IMPRESSION: 1. No emergent large vessel occlusion or hemodynamically significant stenosis of the head or neck. 2. Unchanged appearance of hemorrhagic lesion of the left posterior parietal lobe. 3. Complete opacification of the  left thoracic apex and multiple pulmonary nodules in the visualized right lung apex, measuring up to 9 mm. These findings are consistent with known metastatic disease 4. Upper mediastinal lymphadenopathy. Aortic Atherosclerosis (ICD10-I70.0). Electronically Signed   By: Deatra Robinson M.D.   On: 07/30/2023 19:49   VAS [place] LOWER EXTREMITY VENOUS (DVT) Result Date: 07/29/2023  Lower Venous DVT Study Patient Name:  JORDELL GIBBINS  Date of Exam:   07/29/2023 Medical Rec #: [id number]        Accession #:    [id number] Date of Birth: 04/29/1961       Patient Gender: M Patient Age:   83 years Exam Location:  [facility] Procedure:      VAS [place] LOWER EXTREMITY VENOUS (DVT) Referring Phys: CHING TU --------------------------------------------------------------------------------  Indications: Pulmonary embolism.  Risk Factors: Confirmed PE Surgery RT BKA. Anticoagulation: Heparin. Comparison Study: No significant changes seen since prior exam 07/24/21 Performing Technologist: Shona Simpson  Examination Guidelines: A complete evaluation includes B-mode imaging, spectral Doppler, color Doppler, and power Doppler as needed of all accessible portions of each vessel. Bilateral testing is considered an integral part of a complete examination. Limited examinations for reoccurring indications may be performed as noted. The reflux portion of the exam is performed with the patient in reverse Trendelenburg.  +---------+---------------+---------+-----------+----------+--------------+ RIGHT    CompressibilityPhasicitySpontaneityPropertiesThrombus Aging +---------+---------------+---------+-----------+----------+--------------+ CFV      Full           Yes      Yes                                 +---------+---------------+---------+-----------+----------+--------------+ SFJ      Full                                                         +---------+---------------+---------+-----------+----------+--------------+ FV Prox  Full                                                        +---------+---------------+---------+-----------+----------+--------------+  FV Mid   Full                                                        +---------+---------------+---------+-----------+----------+--------------+ FV DistalFull                                                        +---------+---------------+---------+-----------+----------+--------------+ PFV      Full                                                        +---------+---------------+---------+-----------+----------+--------------+ POP      Full           Yes      Yes                                 +---------+---------------+---------+-----------+----------+--------------+ PTV      Full                                                        +---------+---------------+---------+-----------+----------+--------------+ PERO     Full                                                        +---------+---------------+---------+-----------+----------+--------------+   +---------+---------------+---------+-----------+----------+--------------+ LEFT     CompressibilityPhasicitySpontaneityPropertiesThrombus Aging +---------+---------------+---------+-----------+----------+--------------+ CFV      Full           Yes      Yes                                 +---------+---------------+---------+-----------+----------+--------------+ SFJ      Full                                                        +---------+---------------+---------+-----------+----------+--------------+ FV Prox  Full                                                        +---------+---------------+---------+-----------+----------+--------------+ FV Mid   Full                                                         +---------+---------------+---------+-----------+----------+--------------+  FV DistalFull                                                        +---------+---------------+---------+-----------+----------+--------------+ PFV      Full                                                        +---------+---------------+---------+-----------+----------+--------------+ POP      Full           Yes      Yes                                 +---------+---------------+---------+-----------+----------+--------------+ PTV      Full                                                        +---------+---------------+---------+-----------+----------+--------------+ PERO     Full                                                        +---------+---------------+---------+-----------+----------+--------------+     Summary: BILATERAL: - No evidence of deep vein thrombosis seen in the lower extremities, bilaterally. -No evidence of popliteal cyst, bilaterally.   *See table(s) above for measurements and observations. Electronically signed by Gerarda Fraction on 07/29/2023 at 6:50:19 PM.    Final    ECHOCARDIOGRAM COMPLETE Result Date: 07/29/2023    ECHOCARDIOGRAM REPORT   Patient Name:   JORGELUIS MUKES Date of Exam: 07/29/2023 Medical Rec #:  [id number]       Height:       75.0 in Accession #:    [id number]      Weight:       171.3 lb Date of Birth:  05-Apr-1961      BSA:          2.055 m Patient Age:    62 years        BP:           140/117 mmHg Patient Gender: M               HR:           122 bpm. Exam Location:  Inpatient Procedure: 2D Echo, Cardiac Doppler and Color Doppler Indications:    Atrial Fibrillation I48.91  History:        Patient has no prior history of Echocardiogram examinations.                 Arrythmias:Atrial Fibrillation, Signs/Symptoms:Dyspnea; Risk                 Factors:Hypertension, Diabetes, Current Smoker and Dyslipidemia.  Sonographer:    Lucendia Herrlich RCS Referring  Phys: [id number] CHING T TU  Sonographer Comments: Image acquisition challenging due to uncooperative patient. IMPRESSIONS  1. Left ventricular ejection fraction, by estimation, is 30 to 35%. The left ventricle has moderately decreased function. The left ventricle demonstrates global hypokinesis. The left ventricular internal cavity size was mildly dilated. Left ventricular diastolic parameters are indeterminate.  2. Right ventricular systolic function is normal. The right ventricular size is normal. There is normal pulmonary artery systolic pressure. The estimated right ventricular systolic pressure is 25.8 mmHg.  3. The mitral valve is normal in structure. Trivial mitral valve regurgitation. No evidence of mitral stenosis.  4. The aortic valve is tricuspid. There is mild calcification of the aortic valve. Aortic valve regurgitation is trivial. No aortic stenosis is present.  5. The inferior vena cava is dilated in size with <50% respiratory variability, suggesting right atrial pressure of 15 mmHg.  6. The patient was in atrial fibrillation. FINDINGS  Left Ventricle: Left ventricular ejection fraction, by estimation, is 30 to 35%. The left ventricle has moderately decreased function. The left ventricle demonstrates global hypokinesis. The left ventricular internal cavity size was mildly dilated. There is no left ventricular hypertrophy. Left ventricular diastolic parameters are indeterminate. Right Ventricle: The right ventricular size is normal. No increase in right ventricular wall thickness. Right ventricular systolic function is normal. There is normal pulmonary artery systolic pressure. The tricuspid regurgitant velocity is 1.64 m/s, and  with an assumed right atrial pressure of 15 mmHg, the estimated right ventricular systolic pressure is 25.8 mmHg. Left Atrium: Left atrial size was normal in size. Right Atrium: Right atrial size was normal in size. Pericardium: There is no evidence of pericardial effusion.  Mitral Valve: The mitral valve is normal in structure. There is mild thickening of the mitral valve leaflet(s). Trivial mitral valve regurgitation. No evidence of mitral valve stenosis. Tricuspid Valve: The tricuspid valve is normal in structure. Tricuspid valve regurgitation is trivial. Aortic Valve: The aortic valve is tricuspid. There is mild calcification of the aortic valve. Aortic valve regurgitation is trivial. No aortic stenosis is present. Aortic valve peak gradient measures 9.7 mmHg. Pulmonic Valve: The pulmonic valve was normal in structure. Pulmonic valve regurgitation is not visualized. Aorta: The aortic root is normal in size and structure. Venous: The inferior vena cava is dilated in size with less than 50% respiratory variability, suggesting right atrial pressure of 15 mmHg. IAS/Shunts: No atrial level shunt detected by color flow Doppler.  LEFT VENTRICLE PLAX 2D LVIDd:         5.50 cm   Diastology LVIDs:         4.60 cm   LV e' medial:    9.64 cm/s LV PW:         1.00 cm   LV E/e' medial:  10.8 LV IVS:        0.90 cm   LV e' lateral:   14.10 cm/s LVOT diam:     2.10 cm   LV E/e' lateral: 7.4 LV SV:         62 LV SV Index:   30 LVOT Area:     3.46 cm  RIGHT VENTRICLE             IVC RV S prime:     15.50 cm/s  IVC diam: 2.40 cm TAPSE (M-mode): 2.1 cm LEFT ATRIUM         Index LA diam:    3.90 cm 1.90 cm/m  AORTIC VALVE AV Area (Vmax): 2.73 cm AV Vmax:        155.50 cm/s AV Peak Grad:   9.7 mmHg  LVOT Vmax:      122.67 cm/s LVOT Vmean:     80.267 cm/s LVOT VTI:       0.180 m  AORTA Ao Root diam: 3.70 cm Ao Asc diam:  3.60 cm MITRAL VALVE                TRICUSPID VALVE MV Area (PHT): 5.84 cm     TR Peak grad:   10.8 mmHg MV Decel Time: 130 msec     TR Vmax:        164.00 cm/s MV E velocity: 104.00 cm/s MV A velocity: 70.50 cm/s   SHUNTS MV E/A ratio:  1.48         Systemic VTI:  0.18 m                             Systemic Diam: 2.10 cm Dalton McleanMD Electronically signed by Wilfred Lacy  Signature Date/Time: 07/29/2023/5:47:50 PM    Final    EEG adult Result Date: 07/29/2023 Charlsie Quest, MD     07/29/2023 11:52 AM Patient Name: JANIE MISSEL MRN: [id number] Epilepsy Attending: Charlsie Quest Referring Physician/Provider: Caryl Pina, MD Date: 07/29/2023 Duration: 25.56 mins Patient history: 62 yo M presented to the ED yesterday afternoon with a one week history of AMS and back pain. EEG to evaluate for seizure Level of alertness: Awake AEDs during EEG study: None Technical aspects: This EEG study was done with scalp electrodes positioned according to the 10-20 International system of electrode placement. Electrical activity was reviewed with band pass filter of 1-70Hz , sensitivity of 7 uV/mm, display speed of 32mm/sec with a 60Hz  notched filter applied as appropriate. EEG data were recorded continuously and digitally stored.  Video monitoring was available and reviewed as appropriate. Description: No posterior dominant rhythm was seen. EEG showed continuous generalized 3 to 6 Hz theta-delta slowing admixed with 12-14 Hz beta activity distributed symmetrically and diffusely.  Physiologic photic driving was not seen during photic stimulation.  Hyperventilation was not performed.   ABNORMALITY - Continuous slow, generalized IMPRESSION: This study is suggestive of moderate diffuse encephalopathy. No seizures or epileptiform discharges were seen throughout the recording. Charlsie Quest    CT Angio Chest Pulmonary Embolism (PE) W or WO Contrast Result Date: 07/29/2023 CLINICAL DATA:  Pulmonary embolism suspected. Positive D-dimer. CT yesterday demonstrating a large left upper lobe neoplasm with mediastinal and hilar adenopathy and bronchovascular encasement. EXAM: CT ANGIOGRAPHY CHEST WITH CONTRAST TECHNIQUE: Multidetector CT imaging of the chest was performed using the standard protocol during bolus administration of intravenous contrast. Multiplanar CT image reconstructions and MIPs  were obtained to evaluate the vascular anatomy. RADIATION DOSE REDUCTION: This exam was performed according to the departmental dose-optimization program which includes automated exposure control, adjustment of the mA and/or kV according to patient size and/or use of iterative reconstruction technique. CONTRAST:  75mL OMNIPAQUE IOHEXOL 350 MG/ML SOLN COMPARISON:  CT chest, abdomen and pelvis with IV contrast yesterday at 5:28 p.m. (current exam 07/29/2023 at 12:08 a.m.). FINDINGS: Cardiovascular: There is a small embolic burden in the right lower lobe with nonocclusive thrombus within a posterior basal subsegmental branch likely extending into 2 of its downstream small branches. There is no evidence of right heart strain and no further embolus is seen. Bronchovascular tumor encasement narrows the distal left main pulmonary artery and it appears to almost completely occlude the left upper lobe pulmonary artery, and occludes the left superior pulmonary  vein. Pulmonary trunk is upper limits of normal in caliber. Cardiac size is normal. There is a small pericardial effusion. There is no venous dilatation. Aortic and three-vessel coronary artery atherosclerosis is present but no aneurysm, stenosis or dissection, with scattered plaque in the great vessels. Mediastinum/Nodes: Large left upper lobe mass encases and obscures the hilar structures for the most part. There is undoubtedly left hilar adenopathy but it is difficult to separate from the mass. Multifocal mediastinal adenopathy is seen with subcarinal nodal mass 4.3 x 2.6 cm on 5:74, right paratracheal adenopathy with multiple lymph nodes enlarged for example measuring 2 x 1.9 cm on 5:48, enlarged right hilar nodes measuring as much as 2.4 x 3.1 cm on 5:71, and left axillary adenopathy with the largest of these lymph nodes measuring 2.3 x 2.0 cm on 5:53. The trachea and main bronchi are patent. Unremarkable thoracic esophagus. Lungs/Pleura: Large heterogeneous left  upper lobe mass obstructs the left upper lobe bronchus, with no visible remaining aeration in the left upper lobe. Scattered foci of air in the apical portion of the mass could indicate tumoral necrosis or residual air bronchograms versus infectious complication. In total the mass measures 12.7 cm AP on 5:53, 9 cm transverse on 5:44, and 14.6 cm in height. Small layering left and minimal layering right pleural effusions are also again noted as well as multiple right lung nodules consistent with metastases. There is bilateral bronchial thickening but it is proportionately greater in the left lower lobe where there is peribronchovascular interstitial and airspace opacities which could indicate changes of bronchopneumonia or lymphangitic carcinomatosis. Multiple micronodules are noted in this distribution. Examples of some of the larger right lung metastases include a 1.1 cm nodule in the right upper lobe on 6:46, a right lower lobe superior segment nodule measuring 1.4 cm on 6:51, and a 1.3 cm right lower lobe nodule on 6:119. Others are smaller. There are mild centrilobular and paraseptal emphysematous changes in the right upper lobe. Upper Abdomen: No upper abdominal metastasis is seen. Musculoskeletal: Thoracic spondylosis. Partially visible lower cervical ACDF plating. Multilevel thoracic degenerative disc disease. No destructive bone lesions. Review of the MIP images confirms the above findings. IMPRESSION: 1. Small subsegmental embolic burden in the right lower lobe, with no evidence of right heart strain. 2. Large 12.7 x 9 x 14.6 cm left upper lobe mass with bronchovascular encasement, occluding the left upper lobe bronchus and almost completely occluding the left upper lobe pulmonary artery, and occluding the left superior pulmonary vein. 3. Multiple right lung nodules consistent with metastases. 4. Bilateral bronchial thickening but proportionately greater in the left lower lobe where there is  peribronchovascular interstitial and airspace opacities which could indicate changes of bronchopneumonia or lymphangitic carcinomatosis. 5. Small left and minimal right pleural effusions. 6. Multifocal mediastinal, right hilar and left axillary adenopathy. 7. Aortic and coronary artery atherosclerosis. 8. Emphysema. 9. These results will be called to the ordering clinician or representative by the Radiologist Assistant, and communication documented in the PACS or [employer]. Aortic Atherosclerosis (ICD10-I70.0) and Emphysema (ICD10-J43.9). Electronically Signed   By: Almira Bar M.D.   On: 07/29/2023 00:42   MR Lumbar Spine W Wo Contrast Result Date: 07/28/2023 CLINICAL DATA:  Initial evaluation for acute low back pain EXAM: MRI LUMBAR SPINE WITHOUT AND WITH CONTRAST TECHNIQUE: Multiplanar and multiecho pulse sequences of the lumbar spine were obtained without and with intravenous contrast. CONTRAST:  10mL GADAVIST GADOBUTROL 1 MMOL/ML IV SOLN COMPARISON:  Prior CT from earlier the same  day. FINDINGS: Segmentation: Standard. Lowest well-formed disc space labeled the L5-S1 level. Alignment: 2 mm degenerative retrolisthesis of L1 on L2 and L2 on L3, with 3 mm anterolisthesis of L5 on S1. Vertebrae: Abnormal marrow replacement with edema and heterogeneous enhancement seen within the L1 vertebral body, concerning for osseous metastatic disease. Associated mild pathologic compression fracture measures up to 20% without bony retropulsion. No extra osseous or epidural extension of tumor. No other visible metastatic lesions within the lumbar spine. Bone marrow signal intensity otherwise normal. Prominent degenerative reactive endplate changes noted about the L2-3 interspace. No other abnormal marrow edema or enhancement. Conus medullaris and cauda equina: Conus extends to the T12-L1 no abnormal enhancement. Level. Conus and cauda equina appear normal. Paraspinal and other soft tissues: Changes of acute  pyelonephritis involving the left greater than right kidneys noted, described on prior CT. 5.3 cm simple right renal cyst, benign in appearance, no follow-up imaging recommended. Disc levels: T12-L1: Mild diffuse disc bulge with reactive endplate spurring. Mild facet hypertrophy. No spinal stenosis. Foramina remain patent. L1-2: Degenerative intervertebral disc space narrowing with diffuse disc bulge and disc desiccation. Reactive endplate spurring. Mild bilateral facet hypertrophy. Resultant mild narrowing of the lateral recesses bilaterally. Central canal remains patent. Foramina remain adequately patent bilaterally. L2-3: Advanced degenerative intervertebral disc space narrowing with diffuse disc bulge and disc desiccation. Prominent reactive endplate change with marginal endplate osteophytic spurring and marrow edema. Mild to moderate bilateral facet arthrosis. Resultant moderate canal with bilateral subarticular stenosis. Moderate left worse than right L2 foraminal narrowing. L3-4: Diffuse disc bulge with reactive endplate spurring. Superimposed small left foraminal disc protrusion contacts the exiting left L3 nerve root (series 9, image 12). Mild-to-moderate bilateral facet hypertrophy. Resultant mild narrowing of the lateral recesses bilaterally. Central canal remains patent. Mild to moderate left L3 foraminal stenosis. Right neural foramen remains patent. L4-5: Disc desiccation with diffuse disc bulge. Reactive endplate spurring. Moderate to severe bilateral facet arthrosis with associated small reactive joint effusions. Resultant mild to moderate bilateral subarticular stenosis. Central canal remains patent. Moderate to severe bilateral L4 foraminal narrowing. L5-S1: Anterolisthesis with degenerative intervertebral disc space narrowing. Diffuse disc bulge with reactive endplate spurring. Moderate bilateral facet hypertrophy. No significant spinal stenosis. Moderate bilateral L5 foraminal narrowing.  IMPRESSION: 1. Abnormal marrow replacement with heterogeneous enhancement within the L1 vertebral body, concerning for osseous metastatic disease. Associated mild pathologic compression fracture measures up to 20% without bony retropulsion. No extra osseous or epidural extension of tumor. 2. No other evidence for metastatic disease within the lumbar spine. 3. Multilevel lumbar spondylosis with resultant moderate canal stenosis at L2-3. Associated multilevel foraminal narrowing as above, moderate to severe in nature at L4-5 bilaterally. 4. Changes of acute pyelonephritis involving the left greater than right kidneys, described on prior CT. Electronically Signed   By: Rise Mu M.D.   On: 07/28/2023 23:59   MR BRAIN W WO CONTRAST Result Date: 07/28/2023 CLINICAL DATA:  Initial evaluation for brain/CNS neoplasm EXAM: MRI HEAD WITHOUT AND WITH CONTRAST TECHNIQUE: Multiplanar, multiecho pulse sequences of the brain and surrounding structures were obtained without and with intravenous contrast. CONTRAST:  10mL GADAVIST GADOBUTROL 1 MMOL/ML IV SOLN COMPARISON:  Prior CT from 07/28/2023. FINDINGS: Brain: Cerebral volume within normal limits. Patchy T2/FLAIR hyperintensity involving the periventricular and deep white matter both supra hemispheres, consistent with chronic small vessel ischemic disease, moderately advanced in nature. Punctate acute ischemic nonhemorrhagic infarct present at the right parietal cortex (series 9, image 98). No other evidence for acute or  subacute ischemia. Mixed cystic and solid peripherally enhancing mass positioned at the left occipital lobe is seen, measuring 4.0 x 3.5 x 3.7 cm. Mild surrounding vasogenic edema without significant regional mass effect or midline shift. 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 No other visible mass lesions or abnormal enhancement. No hydrocephalus or extra-axial fluid collection.  Pituitary gland suprasellar region within normal limits. Vascular: Major intracranial vascular flow voids are maintained. Skull and upper cervical spine: Craniocervical junction within normal limits. Visualized bone marrow signal intensity within normal limits. Postoperative changes noted within the partially visualized upper cervical spine. Sinuses/Orbits: Globes and orbital soft tissues within normal limits. Paranasal sinuses are largely clear. No significant mastoid effusion. Other: None. IMPRESSION: 1. 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 Mild surrounding vasogenic edema without significant regional mass effect or midline shift. 2. 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 3. Underlying moderately advanced chronic microvascular ischemic disease. Electronically Signed   By: Rise Mu M.D.   On: 07/28/2023 23:48   CT L-SPINE NO CHARGE Result Date: 07/28/2023 CLINICAL DATA:  Back pain after fall December 1st EXAM: CT LUMBAR SPINE WITHOUT CONTRAST TECHNIQUE: Multidetector CT imaging of the lumbar spine was performed without intravenous contrast administration. Multiplanar CT image reconstructions were also generated. RADIATION DOSE REDUCTION: This exam was performed according to the departmental dose-optimization program which includes automated exposure control, adjustment of the mA and/or kV according to patient size and/or use of iterative reconstruction technique. COMPARISON:  CT 04/05/2016 FINDINGS: Segmentation: 5 lumbar type vertebrae. Alignment: No evidence of traumatic listhesis. Mild grade 1 retrolisthesis of L2 is likely chronic. Vertebrae: Mild superior endplate compression of L1, may be chronic though is age indeterminate without recent comparison. Otherwise no evidence of acute fracture. There is mild heterogeneity of the marrow of L1 and  metastasis is not excluded. Consider MRI for further evaluation. Paraspinal and other soft tissues: See separate report for findings in the abdomen and pelvis. Disc levels: Multilevel spondylosis and disc space height loss with degenerative endplate changes greatest at L2-L3 where it is advanced. Mild multilevel facet arthropathy. Spinal canal narrowing is greatest at L2-L3 where it is moderate. IMPRESSION: 1. Mild superior endplate compression of L1 may be chronic though is age indeterminate without recent comparison. 2. Heterogeneity of the marrow of L1. This may be related to chronic changes however metastasis is not excluded. Consider MRI for further evaluation. \ 3. Advanced disc space height loss at L2-L3. 4. See separate report for findings in the chest, abdomen, and pelvis. Electronically Signed   By: Minerva Fester M.D.   On: 07/28/2023 18:50   CT CHEST ABDOMEN PELVIS W CONTRAST Result Date: 07/28/2023 CLINICAL DATA:  Altered mental status for 1 week and back pain after a fall on December 1st. EXAM: CT CHEST, ABDOMEN, AND PELVIS WITH CONTRAST TECHNIQUE: Multidetector CT imaging of the chest, abdomen and pelvis was performed following the standard protocol during bolus administration of intravenous contrast. RADIATION DOSE REDUCTION: This exam was performed according to the departmental dose-optimization program which includes automated exposure control, adjustment of the mA and/or kV according to patient size and/or use of iterative reconstruction technique. CONTRAST:  75mL OMNIPAQUE IOHEXOL 350 MG/ML SOLN COMPARISON:  CT abdomen and pelvis 01/14/2008 and chest radiographs 12/07/2014 FINDINGS: CT CHEST FINDINGS Cardiovascular: Normal heart size. Small pericardial effusion. Pericardial  thickening where it abuts the large left suprahilar mass. Coronary artery and aortic atherosclerotic calcification. The left upper lobe pulmonary arteries are not well visualized and may be compressed or occluded by the  mass. Mediastinum/Nodes: Large heterogenous mass in the left suprahilar region extending into the left upper lobe. Mediastinal, bilateral hilar, and left axillary lymphadenopathy. For example a subcarinal node measures 2.5 cm (series 3/image 27); 1.3 cm right hilar node on series 3/image 34; and a left axillary node measures 2.2 cm (3/19). Trachea and esophagus are unremarkable. Lungs/Pleura: Large heterogenous mass left upper lobe/suprahilar mass consistent with malignancy measuring 12.1 x 8.7 by 12.1 cm. There are a few small locules of gas in the superior portion of the mass which may represent cavitation. The left upper lobe bronchus is occluded by the mass. Diffuse bronchial wall thickening in the lingula and left lower lobe with centrilobular micro nodules and tree-in-bud opacities. 1.6 cm subpleural nodule in the anterior lingula (series 4/image 90). Small left pleural effusion. Multiple pulmonary nodules in the right lung consistent with metastases. For example 12 mm nodule in the posterior right lower lobe (4/128) and 9 mm nodule in the right upper lobe (4/44). Mild emphysema in the right. No right pleural effusion. No pneumothorax. Musculoskeletal: No acute fracture.  No destructive osseous lesion. CT ABDOMEN PELVIS FINDINGS Hepatobiliary: No focal hepatic lesion. Layering sludge in the gallbladder. No biliary dilation or evidence of cholecystitis. Pancreas: Unremarkable. Spleen: Unremarkable. Adrenals/Urinary Tract: Normal adrenal glands. Heterogenous attenuation of the left kidney. Focal geographic hypoattenuation in the posterior right kidney. Mild asymmetric left-greater-than-right perinephric stranding. Findings are compatible with pyelonephritis. No urinary calculi or hydronephrosis. Unremarkable bladder. Stomach/Bowel: Normal caliber large and small bowel without bowel wall thickening. Stomach and appendix are within normal limits. Colonic diverticulosis without diverticulitis. Vascular/Lymphatic:  Aortic atherosclerosis. 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 The stent in the right superficial femoral artery is not definitively opacified and may be occluded. No enlarged abdominal or pelvic lymph nodes. Reproductive: No acute abnormality. Other: No free intraperitoneal fluid or air. Musculoskeletal: No acute fracture or destructive osseous lesion in the pelvis. See separate report for findings in the lumbar spine. IMPRESSION: CHEST: 1. Large heterogenous mass in the left upper lobe/suprahilar region consistent with malignancy. 2. Diffuse bronchial wall thickening in the lingula and left lower lobe. Differential considerations include lymphangitic spread of tumor or airway infection/inflammation. 3. Multiple pulmonary nodules in the right lung consistent with metastases. 4. Mediastinal, bilateral hilar, and left axillary lymphadenopathy consistent with metastases. 5. Small left pleural effusion. ABDOMEN/PELVIS: 1. Bilateral pyelonephritis, left-greater-than-right. 2. The stent in the right superficial femoral artery is not definitively opacified and may be occluded though this is incompletely evaluated on non arterial phase exam. 3. 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 Aortic Atherosclerosis (ICD10-I70.0) and Emphysema (ICD10-J43.9). Electronically Signed   By: Minerva Fester M.D.   On: 07/28/2023 18:16   CT Head Wo Contrast Result Date: 07/28/2023 CLINICAL DATA:  Delirium EXAM: CT HEAD WITHOUT CONTRAST TECHNIQUE: Contiguous axial images were obtained from the base of the skull through the vertex without intravenous contrast. RADIATION DOSE REDUCTION: This exam was performed according to the departmental dose-optimization program which includes automated exposure control, adjustment of the mA and/or kV according to  patient size and/or use of iterative reconstruction technique. COMPARISON:  CT of the head  December 07, 2014. FINDINGS: Brain: Complex cystic and solid 3.4 cm mass in the left parietal lobe. This lesion has some mild hyperdensity associated with it which could represent mineralization and or hemorrhage. Surrounding edema and mild regional mass effect without midline shift. No evidence of acute large vascular territory infarct, mass occupying acute hemorrhage outside the above lesion, midline shift or hydrocephalus. Vascular: No hyperdense vessel identified. Skull: Normal. Negative for fracture or focal lesion. Sinuses/Orbits: Mostly clear sinuses.  No acute orbital findings. IMPRESSION: 1. Cystic and solid 3.4 cm mass in the left parietal lobe, which is concerning for a metastasis given the findings on the forthcoming CT of the chest/abdomen/pelvis. Recommend MRI of the head with contrast to further characterize and to assess for other lesions. 2. Surrounding edema and mild regional mass effect without midline shift. Electronically Signed   By: Feliberto Harts M.D.   On: 07/28/2023 18:02    Microbiology: Results for orders placed or performed during the hospital encounter of 07/28/23  Blood culture (routine x 2)     Status: None   Collection Time: 07/28/23  3:48 PM   Specimen: BLOOD RIGHT ARM  Result Value Ref Range Status   Specimen Description BLOOD RIGHT ARM  Final   Special Requests   Final    BOTTLES DRAWN AEROBIC AND ANAEROBIC Blood Culture adequate volume   Culture   Final    NO GROWTH 5 DAYS Performed at [facility], [address], [place], [place] [address]    Report Status 08/02/2023 FINAL  Final  Blood culture (routine x 2)     Status: None   Collection Time: 07/28/23  3:48 PM   Specimen: BLOOD LEFT ARM  Result Value Ref Range Status   Specimen Description BLOOD LEFT ARM  Final   Special Requests   Final    BOTTLES DRAWN AEROBIC AND ANAEROBIC Blood Culture adequate volume    Culture   Final    NO GROWTH 5 DAYS Performed at [facility], [address] [place], [place] [address]    Report Status 08/02/2023 FINAL  Final    Labs: CBC: Recent Labs  Lab 07/30/23 0422 07/31/23 0233 08/01/23 0420 08/02/23 0252 08/03/23 1057 08/04/23 0700  WBC 18.2* 19.6* 16.6* 19.9* 19.1* 18.8*  NEUTROABS 16.6* 17.8* 15.3* 18.4* 16.7*  --   HGB 11.1* 12.7* 12.3* 11.7* 11.9* 12.5*  HCT 35.5* 40.0 38.6* 36.2* 37.9* 40.6  MCV 84.5 83.7 82.7 81.7 84.8 86.0  PLT 448* 499* 311 370 340 370   Basic Metabolic Panel: Recent Labs  Lab 07/30/23 0422 07/30/23 2326 07/31/23 0233 08/01/23 0420 08/02/23 0252 08/03/23 1057 08/04/23 0700 08/05/23 0542  NA 131* 136 136 135 132* 131* 131*  --   K 4.3 4.8 4.3 4.7 4.5 4.1 4.3  --   CL 101 101 100 103 101 100 99  --   CO2 22 21* 23 22 21* 20* 20*  --   GLUCOSE 236* 153* 154* 157* 253* 184* 161*  --   BUN 32* 32* 30* 31* 24* 21 19  --   CREATININE 0.90 0.91 0.90 0.69 0.72 0.49* 0.61  --   CALCIUM 11.9* 12.1* 11.7* 10.0 9.2 8.8* 8.6*  --   MG 2.0 1.9 2.0 2.1 2.1  --   --   --   PHOS 3.7  --  2.3* 2.2* 1.4*  --  1.6* 1.6*   Liver Function Tests: Recent Labs  Lab 07/31/23 0233 08/01/23 0420 08/02/23 0252 08/03/23  1057 08/04/23 0700  AST 19 14* 16 21 21   ALT 12 11 12 14 16   ALKPHOS 80 74 76 73 79  BILITOT 0.6 0.6 0.5 0.5 0.4  PROT 6.4* 5.8* 6.0* 6.9 7.0  ALBUMIN 2.4* 2.0* 2.1* 2.6* 2.6*   CBG: Recent Labs  Lab 08/04/23 1153 08/04/23 1716 08/04/23 2146 08/05/23 0743 08/05/23 1231  GLUCAP 99 178* 171* 150* 232*    Discharge time spent: less than 30 minutes.  Signed: Rickey Barbara, MD [facility] 08/05/2023

## 2023-08-05 NOTE — Progress Notes (Signed)
Foundation One testing requested on accession number [id number]

## 2023-08-05 NOTE — Plan of Care (Signed)
Discharge today

## 2023-08-05 NOTE — Progress Notes (Unsigned)
Oncology Discharge Planning Note  [facility] Address: [address], [place], [place] [address] Hours of Operation:  Lewayne Bunting, Monday - Friday  Clinic Contact Information:  [phone]  Oncology Care Team: Medical Oncologist:  Truett Perna  Patient Details: Name:  Cody Pena, Cody Pena MRN:   [id number] DOB:   1960/12/31 Reason for Current Admission: @PPROB @  Discharge Planning Narrative: Notification of admission received by inpatient team for Cody Pena.  Discharge follow-up appointments for oncology are current and available on the AVS and MyChart.   Upon discharge from the hospital, hematology/oncology's post discharge plan of care for the outpatient setting is:  August 16, 2023 at 2:45 with Cody Cobb NP and Dr Truett Perna  [facility] [address] [place], [place] [address]  [phone]    Cody Pena will be called within two business days after discharge to review hematology/oncology's plan of care for full understanding.    Outpatient Oncology Specific Care Only: Oncology appointment transportation needs addressed?:  no Oncology medication management for symptom management addressed?:  no Chemo Alert Card reviewed?:  not applicable Immunotherapy Alert Card reviewed?:  not applicable

## 2023-08-05 NOTE — Progress Notes (Signed)
  Daily Progress Note   Patient Name: Cody Pena       Date: 08/05/2023 DOB: Jun 16, 1961  Age: 62 y.o. MRN#: [id number] Attending Physician: Jerald Kief, MD Primary Care Physician: [facility] Family Admit Date: 07/28/2023 Length of Stay: 8 days  Attempted to follow up with patient today regarding complex medical decision making.  Discussed care with hospitalist and oncologist.  After discussion with oncologist, patient has decided to proceed with cancer directed therapies including pursuing systemic therapies and brain radiation.  Oncologist coordinating with family regarding planning.  I attempted to visit with patient today to follow-up though patient not in room when presented.  Palliative medicine will continue to engage in conversations as able.  If patient is to be discharged, recommend referral to outpatient palliative at [facility].  Alvester Morin, DO Palliative Care Provider PMT # [phone]

## 2023-08-05 NOTE — Progress Notes (Signed)
pt is confused, constantly removing his tele, refused to keep the monitor on. On call provider notified and aware.

## 2023-08-05 NOTE — Progress Notes (Signed)
AVS reviewed w/ pt and son-in-law. Pt's family waiting downstairs- dressed in paper scrubs. Pharmacy changed to [facility] on [address]. Pt's son-in-law states Stoney will be living with them in [place]. Pharmacy updated for future visits & care.

## 2023-08-06 ENCOUNTER — Ambulatory Visit: Admission: RE | Admit: 2023-08-06 | Payer: Medicaid Other | Source: Ambulatory Visit

## 2023-08-06 ENCOUNTER — Telehealth: Payer: Self-pay

## 2023-08-06 ENCOUNTER — Ambulatory Visit: Payer: Medicaid Other | Admitting: Radiation Oncology

## 2023-08-06 ENCOUNTER — Ambulatory Visit: Payer: Medicaid Other

## 2023-08-06 NOTE — Telephone Encounter (Signed)
RN called Mr. Cody Pena to see if he was still coming for appointment have to left messages on Mr. Cody Pena phone, wife and daughter.  Radiation team was made aware and they too have tried to reach out to patient and family.

## 2023-08-08 ENCOUNTER — Telehealth: Payer: Self-pay | Admitting: Radiation Therapy

## 2023-08-08 ENCOUNTER — Ambulatory Visit: Payer: Medicaid Other

## 2023-08-08 NOTE — Telephone Encounter (Signed)
Left a message for patient's wife, requesting a call back about her husband's scheduled radiation appointments.   Jalene Mullet R.T.(R)(T) Radiation Special Procedures Navigator

## 2023-08-08 NOTE — Telephone Encounter (Signed)
Attempted to call pt's daughter to discuss the scheduled radiation treatment appointments, no answer and her phone is not set up to leave messages.   Jalene Mullet R.T.(R)(T) Radiation Special Procedures Navigator

## 2023-08-09 ENCOUNTER — Telehealth: Payer: Self-pay | Admitting: Radiation Therapy

## 2023-08-09 ENCOUNTER — Ambulatory Visit: Payer: Medicaid Other

## 2023-08-09 ENCOUNTER — Telehealth: Payer: Self-pay | Admitting: *Deleted

## 2023-08-09 NOTE — Telephone Encounter (Signed)
Pt or family have not returned any calls regarding his radiation treatment course. He was enrolled with [facility] on 12/24. I have reached out to their team and requested a call back from his case manager, Luster Landsberg. We want to be sure not to continue to call or bother the family if he has decided against continuing with treatment.   Jalene Mullet R.T.(R)(T) Radiation Special Procedures Navigator

## 2023-08-09 NOTE — Telephone Encounter (Signed)
I received a call back from [facility]. Mr. Cody Pena has declined rapidly since discharge and is not interested in any treatment or intervention. He is comfort care only and requiring total care from his family. I was instructed to cancel all future appointments.   Jalene Mullet R.T.(R)(T) Radiation Special Procedures Navigator

## 2023-08-09 NOTE — Telephone Encounter (Signed)
Call from Darl Pikes at [facility]: Patient has chosen to be admitted to [facility] and wishes to cancel his 1/03 appointment.

## 2023-08-12 ENCOUNTER — Ambulatory Visit: Payer: Medicaid Other | Admitting: Radiation Oncology

## 2023-08-12 ENCOUNTER — Encounter (HOSPITAL_COMMUNITY): Payer: Self-pay

## 2023-08-12 ENCOUNTER — Ambulatory Visit: Payer: Medicaid Other

## 2023-08-12 ENCOUNTER — Encounter: Payer: Self-pay | Admitting: Radiation Oncology

## 2023-08-12 ENCOUNTER — Encounter: Payer: Self-pay | Admitting: *Deleted

## 2023-08-12 NOTE — Progress Notes (Signed)
Called all numbers associated with pt to check on patient after canceling  appt and to discuss Molecular studies results

## 2023-08-13 ENCOUNTER — Ambulatory Visit: Payer: Medicaid Other

## 2023-08-13 NOTE — Radiation Completion Notes (Signed)
 Patient Name: Cody Pena, Cody Pena MRN: [id number] Date of Birth: 11-09-1960 Referring Physician: LAVADA STANK, M.D. Date of Service: 2023-08-13 Radiation Oncologist: Adina Barge, M.D. 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, left bronchus or lung; C79.31 Secondary malignant neoplasm of brain; C79.51 Secondary malignant neoplasm of bone Intent: Palliative     ==========DELIVERED PLANS==========  First Treatment Date: 2023-08-05 Last Treatment Date: 2023-08-05   Plan Name: Lung_L Site: Lung, Left Technique: 3D Mode: Photon Dose Per Fraction: 4 Gy Prescribed Dose (Delivered / Prescribed): 4 Gy / 32 Gy Prescribed Fxs (Delivered / Prescribed): 1 / 8   Plan Name: Spine_L Site: Lumbar Spine Technique: 3D Mode: Photon Dose Per Fraction: 3.5 Gy Prescribed Dose (Delivered / Prescribed): 3.5 Gy / 28 Gy Prescribed Fxs (Delivered / Prescribed): 1 / 8     ==========ON TREATMENT VISIT DATES==========      ==========UPCOMING VISITS==========       ==========APPENDIX - ON TREATMENT VISIT NOTES==========   See weekly On Treatment Notes in Epic for details in the Media tab (listed as Progress notes on the On Treatment Visit Dates listed above).

## 2023-08-15 ENCOUNTER — Other Ambulatory Visit: Payer: Self-pay

## 2023-08-15 ENCOUNTER — Ambulatory Visit: Payer: Medicaid Other

## 2023-08-15 ENCOUNTER — Ambulatory Visit: Payer: Self-pay | Admitting: Radiation Oncology

## 2023-08-16 ENCOUNTER — Ambulatory Visit: Payer: Medicaid Other

## 2023-08-16 ENCOUNTER — Inpatient Hospital Stay: Payer: Medicaid Other | Admitting: Nurse Practitioner

## 2023-08-16 ENCOUNTER — Encounter (HOSPITAL_COMMUNITY): Payer: Self-pay

## 2023-08-19 ENCOUNTER — Ambulatory Visit: Payer: Medicaid Other

## 2023-08-19 ENCOUNTER — Encounter: Payer: Self-pay | Admitting: Radiation Oncology

## 2023-08-20 ENCOUNTER — Ambulatory Visit: Payer: Medicaid Other

## 2023-08-21 ENCOUNTER — Encounter: Payer: Medicaid Other | Admitting: Radiation Oncology

## 2023-09-02 ENCOUNTER — Encounter (HOSPITAL_COMMUNITY): Payer: Self-pay

## 2023-09-05 ENCOUNTER — Encounter (HOSPITAL_COMMUNITY): Payer: Self-pay

## 2023-09-14 NOTE — Progress Notes (Signed)
 The proposed treatment discussed in conference is for discussion purpose only and is not a binding recommendation.  The patients have not been physically examined, or presented with their treatment options.  Therefore, final treatment plans cannot be decided.

## 2023-09-14 NOTE — Progress Notes (Signed)
 Entered in error

## 2023-09-14 DEATH — deceased
